# Patient Record
Sex: Female | Born: 1965 | Race: White | Hispanic: No | Marital: Married | Smoking: Never smoker
Health system: Southern US, Community
[De-identification: ages and names within clinical notes are randomized; demographics above are authoritative.]

## PROBLEM LIST (undated history)

## (undated) DIAGNOSIS — F329 Major depressive disorder, single episode, unspecified: Secondary | ICD-10-CM

## (undated) DIAGNOSIS — IMO0002 Reserved for concepts with insufficient information to code with codable children: Secondary | ICD-10-CM

## (undated) DIAGNOSIS — K3184 Gastroparesis: Secondary | ICD-10-CM

## (undated) DIAGNOSIS — M329 Systemic lupus erythematosus, unspecified: Secondary | ICD-10-CM

## (undated) DIAGNOSIS — E039 Hypothyroidism, unspecified: Secondary | ICD-10-CM

## (undated) DIAGNOSIS — Z8619 Personal history of other infectious and parasitic diseases: Secondary | ICD-10-CM

## (undated) DIAGNOSIS — R1115 Cyclical vomiting syndrome unrelated to migraine: Secondary | ICD-10-CM

## (undated) DIAGNOSIS — M797 Fibromyalgia: Secondary | ICD-10-CM

## (undated) DIAGNOSIS — G8929 Other chronic pain: Secondary | ICD-10-CM

## (undated) DIAGNOSIS — D649 Anemia, unspecified: Secondary | ICD-10-CM

## (undated) DIAGNOSIS — R109 Unspecified abdominal pain: Secondary | ICD-10-CM

## (undated) DIAGNOSIS — M069 Rheumatoid arthritis, unspecified: Secondary | ICD-10-CM

## (undated) DIAGNOSIS — K920 Hematemesis: Secondary | ICD-10-CM

## (undated) DIAGNOSIS — K529 Noninfective gastroenteritis and colitis, unspecified: Secondary | ICD-10-CM

## (undated) DIAGNOSIS — E876 Hypokalemia: Secondary | ICD-10-CM

## (undated) DIAGNOSIS — K649 Unspecified hemorrhoids: Secondary | ICD-10-CM

## (undated) DIAGNOSIS — K5900 Constipation, unspecified: Secondary | ICD-10-CM

## (undated) DIAGNOSIS — Z9181 History of falling: Secondary | ICD-10-CM

## (undated) DIAGNOSIS — F32A Depression, unspecified: Secondary | ICD-10-CM

## (undated) HISTORY — DX: Personal history of other infectious and parasitic diseases: Z86.19

## (undated) HISTORY — DX: Unspecified hemorrhoids: K64.9

## (undated) HISTORY — PX: OOPHORECTOMY: SHX86

## (undated) HISTORY — PX: CHOLECYSTECTOMY: SHX55

## (undated) HISTORY — DX: Depression, unspecified: F32.A

## (undated) HISTORY — PX: KNEE SURGERY: SHX244

## (undated) HISTORY — PX: REPLACEMENT TOTAL KNEE: SUR1224

## (undated) HISTORY — DX: Hypokalemia: E87.6

## (undated) HISTORY — DX: History of falling: Z91.81

## (undated) HISTORY — DX: Major depressive disorder, single episode, unspecified: F32.9

## (undated) HISTORY — PX: ABDOMINAL HYSTERECTOMY: SHX81

## (undated) HISTORY — DX: Anemia, unspecified: D64.9

## (undated) HISTORY — DX: Constipation, unspecified: K59.00

---

## 2005-03-31 ENCOUNTER — Ambulatory Visit: Payer: Self-pay | Admitting: Family Medicine

## 2005-05-23 ENCOUNTER — Emergency Department: Payer: Self-pay | Admitting: Unknown Physician Specialty

## 2005-05-25 ENCOUNTER — Ambulatory Visit: Payer: Self-pay | Admitting: Family Medicine

## 2005-05-26 ENCOUNTER — Emergency Department: Payer: Self-pay | Admitting: Emergency Medicine

## 2005-05-26 ENCOUNTER — Other Ambulatory Visit: Payer: Self-pay

## 2005-08-23 ENCOUNTER — Ambulatory Visit: Payer: Self-pay | Admitting: Gastroenterology

## 2005-08-27 ENCOUNTER — Ambulatory Visit: Payer: Self-pay | Admitting: Gynecology

## 2005-08-31 ENCOUNTER — Ambulatory Visit: Payer: Self-pay | Admitting: Gynecology

## 2005-09-01 ENCOUNTER — Inpatient Hospital Stay (HOSPITAL_COMMUNITY): Admission: RE | Admit: 2005-09-01 | Discharge: 2005-09-02 | Payer: Self-pay | Admitting: Gynecology

## 2005-09-04 ENCOUNTER — Inpatient Hospital Stay: Payer: Self-pay | Admitting: Internal Medicine

## 2005-09-19 ENCOUNTER — Emergency Department: Payer: Self-pay | Admitting: Emergency Medicine

## 2005-09-21 ENCOUNTER — Inpatient Hospital Stay: Payer: Self-pay | Admitting: Internal Medicine

## 2005-10-06 ENCOUNTER — Emergency Department: Payer: Self-pay | Admitting: Emergency Medicine

## 2005-10-13 ENCOUNTER — Emergency Department: Payer: Self-pay | Admitting: Internal Medicine

## 2005-10-17 ENCOUNTER — Other Ambulatory Visit: Payer: Self-pay

## 2005-10-17 ENCOUNTER — Inpatient Hospital Stay: Payer: Self-pay | Admitting: Internal Medicine

## 2005-10-23 ENCOUNTER — Inpatient Hospital Stay: Payer: Self-pay | Admitting: Internal Medicine

## 2005-11-14 ENCOUNTER — Emergency Department: Payer: Self-pay | Admitting: Emergency Medicine

## 2005-11-17 ENCOUNTER — Inpatient Hospital Stay: Payer: Self-pay

## 2005-12-13 ENCOUNTER — Emergency Department: Payer: Self-pay | Admitting: General Practice

## 2005-12-14 ENCOUNTER — Inpatient Hospital Stay: Payer: Self-pay | Admitting: Internal Medicine

## 2006-02-12 DIAGNOSIS — G8929 Other chronic pain: Secondary | ICD-10-CM

## 2006-02-12 HISTORY — DX: Other chronic pain: G89.29

## 2006-02-19 ENCOUNTER — Ambulatory Visit: Payer: Self-pay | Admitting: Internal Medicine

## 2006-03-04 ENCOUNTER — Ambulatory Visit: Payer: Self-pay | Admitting: Unknown Physician Specialty

## 2006-05-13 ENCOUNTER — Ambulatory Visit: Payer: Self-pay | Admitting: General Practice

## 2006-05-23 ENCOUNTER — Inpatient Hospital Stay: Payer: Self-pay | Admitting: General Practice

## 2006-06-17 ENCOUNTER — Observation Stay: Payer: Self-pay | Admitting: Internal Medicine

## 2006-06-28 ENCOUNTER — Ambulatory Visit: Payer: Self-pay | Admitting: Internal Medicine

## 2006-07-11 ENCOUNTER — Emergency Department: Payer: Self-pay | Admitting: Emergency Medicine

## 2006-09-03 ENCOUNTER — Ambulatory Visit: Payer: Self-pay | Admitting: Internal Medicine

## 2006-10-02 ENCOUNTER — Ambulatory Visit: Payer: Self-pay | Admitting: Pain Medicine

## 2006-10-09 ENCOUNTER — Ambulatory Visit: Payer: Self-pay | Admitting: Pain Medicine

## 2006-10-30 ENCOUNTER — Emergency Department: Payer: Self-pay | Admitting: Emergency Medicine

## 2006-12-03 ENCOUNTER — Ambulatory Visit: Payer: Self-pay | Admitting: Pain Medicine

## 2006-12-09 ENCOUNTER — Ambulatory Visit: Payer: Self-pay | Admitting: Pain Medicine

## 2007-01-07 ENCOUNTER — Ambulatory Visit: Payer: Self-pay | Admitting: Pain Medicine

## 2007-01-27 ENCOUNTER — Inpatient Hospital Stay: Payer: Self-pay | Admitting: Internal Medicine

## 2007-02-10 ENCOUNTER — Ambulatory Visit: Payer: Self-pay | Admitting: Pain Medicine

## 2007-03-11 ENCOUNTER — Ambulatory Visit: Payer: Self-pay | Admitting: Pain Medicine

## 2007-03-17 ENCOUNTER — Ambulatory Visit: Payer: Self-pay | Admitting: Pain Medicine

## 2007-04-15 ENCOUNTER — Ambulatory Visit: Payer: Self-pay | Admitting: Pain Medicine

## 2007-04-21 ENCOUNTER — Ambulatory Visit: Payer: Self-pay | Admitting: Pain Medicine

## 2007-04-27 ENCOUNTER — Ambulatory Visit: Payer: Self-pay | Admitting: Family Medicine

## 2007-05-01 ENCOUNTER — Ambulatory Visit: Payer: Self-pay | Admitting: Pain Medicine

## 2007-05-02 ENCOUNTER — Ambulatory Visit: Payer: Self-pay | Admitting: Pain Medicine

## 2007-05-07 ENCOUNTER — Ambulatory Visit: Payer: Self-pay | Admitting: Pain Medicine

## 2007-05-17 ENCOUNTER — Emergency Department: Payer: Self-pay | Admitting: Emergency Medicine

## 2007-05-21 ENCOUNTER — Ambulatory Visit: Payer: Self-pay | Admitting: Pain Medicine

## 2007-06-02 ENCOUNTER — Other Ambulatory Visit: Payer: Self-pay

## 2007-06-02 ENCOUNTER — Emergency Department: Payer: Self-pay | Admitting: Emergency Medicine

## 2007-06-11 ENCOUNTER — Ambulatory Visit: Payer: Self-pay | Admitting: Pain Medicine

## 2007-07-14 ENCOUNTER — Ambulatory Visit: Payer: Self-pay | Admitting: Pain Medicine

## 2007-08-11 ENCOUNTER — Ambulatory Visit: Payer: Self-pay | Admitting: Pain Medicine

## 2007-09-11 ENCOUNTER — Ambulatory Visit: Payer: Self-pay | Admitting: Pain Medicine

## 2007-09-17 ENCOUNTER — Ambulatory Visit: Payer: Self-pay | Admitting: Pain Medicine

## 2007-09-18 ENCOUNTER — Ambulatory Visit: Payer: Self-pay | Admitting: General Practice

## 2007-09-30 ENCOUNTER — Inpatient Hospital Stay: Payer: Self-pay | Admitting: General Practice

## 2007-10-13 ENCOUNTER — Ambulatory Visit: Payer: Self-pay | Admitting: Pain Medicine

## 2007-10-31 ENCOUNTER — Emergency Department: Payer: Self-pay | Admitting: Emergency Medicine

## 2007-11-02 ENCOUNTER — Emergency Department: Payer: Self-pay | Admitting: Emergency Medicine

## 2007-11-02 ENCOUNTER — Other Ambulatory Visit: Payer: Self-pay

## 2007-11-05 ENCOUNTER — Ambulatory Visit: Payer: Self-pay | Admitting: Pain Medicine

## 2007-11-11 ENCOUNTER — Ambulatory Visit: Payer: Self-pay | Admitting: Pain Medicine

## 2007-11-23 ENCOUNTER — Other Ambulatory Visit: Payer: Self-pay

## 2007-11-23 ENCOUNTER — Emergency Department: Payer: Self-pay | Admitting: Emergency Medicine

## 2007-12-03 ENCOUNTER — Ambulatory Visit: Payer: Self-pay | Admitting: Pain Medicine

## 2007-12-29 ENCOUNTER — Ambulatory Visit: Payer: Self-pay | Admitting: Pain Medicine

## 2008-01-12 ENCOUNTER — Emergency Department: Payer: Self-pay | Admitting: Emergency Medicine

## 2008-01-28 ENCOUNTER — Ambulatory Visit: Payer: Self-pay | Admitting: Pain Medicine

## 2008-02-23 ENCOUNTER — Ambulatory Visit: Payer: Self-pay | Admitting: Pain Medicine

## 2008-03-18 ENCOUNTER — Ambulatory Visit: Payer: Self-pay | Admitting: Pain Medicine

## 2008-03-22 ENCOUNTER — Ambulatory Visit: Payer: Self-pay | Admitting: Pain Medicine

## 2008-04-19 ENCOUNTER — Ambulatory Visit: Payer: Self-pay | Admitting: Pain Medicine

## 2008-05-13 ENCOUNTER — Ambulatory Visit: Payer: Self-pay | Admitting: Pain Medicine

## 2008-05-31 ENCOUNTER — Ambulatory Visit: Payer: Self-pay | Admitting: Pain Medicine

## 2008-06-16 ENCOUNTER — Ambulatory Visit: Payer: Self-pay | Admitting: Pain Medicine

## 2008-07-13 ENCOUNTER — Ambulatory Visit: Payer: Self-pay | Admitting: Pain Medicine

## 2008-07-21 ENCOUNTER — Ambulatory Visit: Payer: Self-pay | Admitting: Pain Medicine

## 2008-08-12 ENCOUNTER — Ambulatory Visit: Payer: Self-pay | Admitting: Pain Medicine

## 2008-09-09 ENCOUNTER — Ambulatory Visit: Payer: Self-pay | Admitting: Pain Medicine

## 2008-10-07 ENCOUNTER — Ambulatory Visit: Payer: Self-pay | Admitting: Pain Medicine

## 2008-11-04 ENCOUNTER — Ambulatory Visit: Payer: Self-pay | Admitting: Pain Medicine

## 2008-11-08 ENCOUNTER — Ambulatory Visit: Payer: Self-pay | Admitting: Pain Medicine

## 2008-11-30 ENCOUNTER — Ambulatory Visit: Payer: Self-pay | Admitting: Pain Medicine

## 2009-01-04 ENCOUNTER — Ambulatory Visit: Payer: Self-pay | Admitting: Pain Medicine

## 2009-01-10 ENCOUNTER — Ambulatory Visit: Payer: Self-pay | Admitting: Pain Medicine

## 2009-01-31 ENCOUNTER — Ambulatory Visit: Payer: Self-pay | Admitting: Pain Medicine

## 2009-03-14 ENCOUNTER — Ambulatory Visit: Payer: Self-pay | Admitting: Pain Medicine

## 2009-03-30 ENCOUNTER — Ambulatory Visit: Payer: Self-pay | Admitting: Pain Medicine

## 2009-04-12 ENCOUNTER — Ambulatory Visit: Payer: Self-pay | Admitting: Pain Medicine

## 2009-05-10 ENCOUNTER — Ambulatory Visit: Payer: Self-pay | Admitting: Pain Medicine

## 2009-06-09 ENCOUNTER — Ambulatory Visit: Payer: Self-pay | Admitting: Pain Medicine

## 2009-06-15 ENCOUNTER — Ambulatory Visit: Payer: Self-pay | Admitting: Pain Medicine

## 2009-07-07 ENCOUNTER — Ambulatory Visit: Payer: Self-pay | Admitting: Pain Medicine

## 2009-07-25 ENCOUNTER — Ambulatory Visit: Payer: Self-pay | Admitting: Pain Medicine

## 2009-08-09 ENCOUNTER — Ambulatory Visit: Payer: Self-pay | Admitting: Pain Medicine

## 2009-09-06 ENCOUNTER — Ambulatory Visit: Payer: Self-pay | Admitting: Pain Medicine

## 2009-09-22 ENCOUNTER — Emergency Department: Payer: Self-pay | Admitting: Emergency Medicine

## 2009-10-04 ENCOUNTER — Emergency Department: Payer: Self-pay | Admitting: Emergency Medicine

## 2009-10-06 ENCOUNTER — Emergency Department: Payer: Self-pay | Admitting: Emergency Medicine

## 2009-10-06 ENCOUNTER — Ambulatory Visit: Payer: Self-pay | Admitting: Pain Medicine

## 2009-10-28 ENCOUNTER — Observation Stay: Payer: Self-pay | Admitting: Internal Medicine

## 2009-11-03 ENCOUNTER — Ambulatory Visit: Payer: Self-pay | Admitting: Pain Medicine

## 2009-11-14 ENCOUNTER — Ambulatory Visit: Payer: Self-pay | Admitting: Pain Medicine

## 2009-12-01 ENCOUNTER — Ambulatory Visit: Payer: Self-pay | Admitting: Pain Medicine

## 2009-12-07 ENCOUNTER — Ambulatory Visit: Payer: Self-pay | Admitting: Pain Medicine

## 2009-12-20 DIAGNOSIS — F329 Major depressive disorder, single episode, unspecified: Secondary | ICD-10-CM

## 2009-12-29 ENCOUNTER — Emergency Department: Payer: Self-pay | Admitting: Emergency Medicine

## 2010-01-03 ENCOUNTER — Ambulatory Visit: Payer: Self-pay | Admitting: Pain Medicine

## 2010-01-09 ENCOUNTER — Ambulatory Visit: Payer: Self-pay | Admitting: Internal Medicine

## 2010-02-02 ENCOUNTER — Ambulatory Visit: Payer: Self-pay | Admitting: Pain Medicine

## 2010-03-01 ENCOUNTER — Ambulatory Visit: Payer: Self-pay | Admitting: Pain Medicine

## 2010-03-27 ENCOUNTER — Ambulatory Visit: Payer: Self-pay | Admitting: Pain Medicine

## 2010-04-13 ENCOUNTER — Emergency Department: Payer: Self-pay | Admitting: Emergency Medicine

## 2010-04-24 ENCOUNTER — Emergency Department: Payer: Self-pay | Admitting: Emergency Medicine

## 2010-04-26 ENCOUNTER — Emergency Department: Payer: Self-pay | Admitting: Emergency Medicine

## 2010-04-27 ENCOUNTER — Ambulatory Visit: Payer: Self-pay | Admitting: Pain Medicine

## 2010-05-18 ENCOUNTER — Emergency Department: Payer: Self-pay | Admitting: Emergency Medicine

## 2010-06-01 ENCOUNTER — Ambulatory Visit: Payer: Self-pay | Admitting: Pain Medicine

## 2010-06-07 ENCOUNTER — Ambulatory Visit: Payer: Self-pay | Admitting: Pain Medicine

## 2010-06-26 ENCOUNTER — Ambulatory Visit: Payer: Self-pay | Admitting: Pain Medicine

## 2010-07-27 ENCOUNTER — Ambulatory Visit: Payer: Self-pay | Admitting: Pain Medicine

## 2010-08-01 DIAGNOSIS — N3946 Mixed incontinence: Secondary | ICD-10-CM | POA: Insufficient documentation

## 2010-08-09 ENCOUNTER — Ambulatory Visit: Payer: Self-pay | Admitting: Pain Medicine

## 2010-08-22 ENCOUNTER — Ambulatory Visit: Payer: Self-pay | Admitting: Pain Medicine

## 2010-08-28 ENCOUNTER — Ambulatory Visit: Payer: Self-pay | Admitting: Pain Medicine

## 2010-09-14 DIAGNOSIS — R1115 Cyclical vomiting syndrome unrelated to migraine: Secondary | ICD-10-CM | POA: Insufficient documentation

## 2010-09-26 ENCOUNTER — Ambulatory Visit: Payer: Self-pay | Admitting: Pain Medicine

## 2010-10-24 ENCOUNTER — Ambulatory Visit: Payer: Self-pay | Admitting: Pain Medicine

## 2010-10-28 ENCOUNTER — Emergency Department: Payer: Self-pay | Admitting: Unknown Physician Specialty

## 2010-10-28 ENCOUNTER — Emergency Department: Payer: Self-pay | Admitting: Emergency Medicine

## 2010-11-08 ENCOUNTER — Ambulatory Visit: Payer: Self-pay | Admitting: Pain Medicine

## 2010-11-28 ENCOUNTER — Ambulatory Visit: Payer: Self-pay | Admitting: Pain Medicine

## 2010-12-21 ENCOUNTER — Ambulatory Visit: Payer: Self-pay | Admitting: Pain Medicine

## 2011-01-01 ENCOUNTER — Ambulatory Visit: Payer: Self-pay | Admitting: Pain Medicine

## 2011-01-23 ENCOUNTER — Ambulatory Visit: Payer: Self-pay | Admitting: Pain Medicine

## 2011-02-11 ENCOUNTER — Emergency Department: Payer: Self-pay | Admitting: Emergency Medicine

## 2011-02-13 ENCOUNTER — Emergency Department: Payer: Self-pay | Admitting: Emergency Medicine

## 2011-02-13 LAB — COMPREHENSIVE METABOLIC PANEL
Alkaline Phosphatase: 113 U/L (ref 50–136)
Anion Gap: 13 (ref 7–16)
BUN: 16 mg/dL (ref 7–18)
Bilirubin,Total: 0.8 mg/dL (ref 0.2–1.0)
Chloride: 107 mmol/L (ref 98–107)
Co2: 22 mmol/L (ref 21–32)
EGFR (African American): >60
Glucose: 112 mg/dL — ABNORMAL HIGH (ref 65–99)
Osmolality: 285 (ref 275–301)
SGOT(AST): 51 U/L — ABNORMAL HIGH (ref 15–37)
SGPT (ALT): 36 U/L
Sodium: 142 mmol/L (ref 136–145)
Total Protein: 8.9 g/dL — ABNORMAL HIGH (ref 6.4–8.2)

## 2011-02-13 LAB — CBC
HCT: 45 % (ref 35.0–47.0)
HGB: 15.4 g/dL (ref 12.0–16.0)
MCH: 30.3 pg (ref 26.0–34.0)
MCHC: 34.2 g/dL (ref 32.0–36.0)
MCV: 89 fL (ref 80–100)
RDW: 17.5 % — ABNORMAL HIGH (ref 11.5–14.5)

## 2011-02-13 LAB — LIPASE, BLOOD: Lipase: 73 U/L (ref 73–393)

## 2011-02-19 ENCOUNTER — Ambulatory Visit: Payer: Self-pay | Admitting: Pain Medicine

## 2011-03-03 ENCOUNTER — Emergency Department: Payer: Self-pay | Admitting: Emergency Medicine

## 2011-03-03 LAB — COMPREHENSIVE METABOLIC PANEL
Anion Gap: 11 (ref 7–16)
Bilirubin,Total: 0.5 mg/dL (ref 0.2–1.0)
Calcium, Total: 9.3 mg/dL (ref 8.5–10.1)
Chloride: 102 mmol/L (ref 98–107)
Co2: 27 mmol/L (ref 21–32)
Creatinine: 1.37 mg/dL — ABNORMAL HIGH (ref 0.60–1.30)
EGFR (African American): 53 — ABNORMAL LOW
EGFR (Non-African Amer.): 44 — ABNORMAL LOW
Osmolality: 281 (ref 275–301)
Potassium: 3.5 mmol/L (ref 3.5–5.1)
Sodium: 140 mmol/L (ref 136–145)

## 2011-03-03 LAB — CBC
MCH: 31.3 pg (ref 26.0–34.0)
Platelet: 218 10*3/uL (ref 150–440)
RBC: 4.78 10*6/uL (ref 3.80–5.20)
RDW: 17.4 % — ABNORMAL HIGH (ref 11.5–14.5)
WBC: 6 10*3/uL (ref 3.6–11.0)

## 2011-03-03 LAB — LIPASE, BLOOD: Lipase: 64 U/L — ABNORMAL LOW (ref 73–393)

## 2011-03-03 LAB — T4, FREE: Free Thyroxine: 0.15 ng/dL — ABNORMAL LOW (ref 0.76–1.46)

## 2011-03-16 DIAGNOSIS — K3184 Gastroparesis: Secondary | ICD-10-CM

## 2011-03-16 HISTORY — DX: Gastroparesis: K31.84

## 2011-03-18 ENCOUNTER — Emergency Department: Payer: Self-pay | Admitting: *Deleted

## 2011-03-18 LAB — LIPASE, BLOOD: Lipase: 77 U/L (ref 73–393)

## 2011-03-18 LAB — CBC
HCT: 41.3 % (ref 35.0–47.0)
MCHC: 34.7 g/dL (ref 32.0–36.0)
MCV: 92 fL (ref 80–100)
Platelet: 222 10*3/uL (ref 150–440)
RDW: 18.3 % — ABNORMAL HIGH (ref 11.5–14.5)
WBC: 5.3 10*3/uL (ref 3.6–11.0)

## 2011-03-18 LAB — COMPREHENSIVE METABOLIC PANEL
Alkaline Phosphatase: 114 U/L (ref 50–136)
Anion Gap: 13 (ref 7–16)
Bilirubin,Total: 0.7 mg/dL (ref 0.2–1.0)
Calcium, Total: 9.2 mg/dL (ref 8.5–10.1)
Co2: 24 mmol/L (ref 21–32)
EGFR (Non-African Amer.): 46 — ABNORMAL LOW
Glucose: 97 mg/dL (ref 65–99)
Osmolality: 285 (ref 275–301)
Sodium: 144 mmol/L (ref 136–145)
Total Protein: 8.7 g/dL — ABNORMAL HIGH (ref 6.4–8.2)

## 2011-03-19 LAB — URINALYSIS, COMPLETE
Bilirubin,UR: NEGATIVE
Ketone: NEGATIVE
Nitrite: NEGATIVE
Ph: 6 (ref 4.5–8.0)
Protein: NEGATIVE
RBC,UR: 3 /HPF (ref 0–5)
Squamous Epithelial: 17
WBC UR: 3 /HPF (ref 0–5)

## 2011-03-19 LAB — COMPREHENSIVE METABOLIC PANEL
Anion Gap: 12 (ref 7–16)
Calcium, Total: 9.1 mg/dL (ref 8.5–10.1)
Co2: 26 mmol/L (ref 21–32)
EGFR (African American): 56 — ABNORMAL LOW
Glucose: 99 mg/dL (ref 65–99)
Potassium: 3.5 mmol/L (ref 3.5–5.1)
SGPT (ALT): 28 U/L
Sodium: 143 mmol/L (ref 136–145)
Total Protein: 8.6 g/dL — ABNORMAL HIGH (ref 6.4–8.2)

## 2011-03-19 LAB — CBC
HGB: 14.3 g/dL (ref 12.0–16.0)
MCV: 92 fL (ref 80–100)
Platelet: 215 10*3/uL (ref 150–440)
RBC: 4.49 10*6/uL (ref 3.80–5.20)
RDW: 18.2 % — ABNORMAL HIGH (ref 11.5–14.5)
WBC: 5.7 10*3/uL (ref 3.6–11.0)

## 2011-03-21 ENCOUNTER — Inpatient Hospital Stay: Payer: Self-pay | Admitting: Internal Medicine

## 2011-03-21 LAB — BASIC METABOLIC PANEL
Anion Gap: 12 (ref 7–16)
BUN: 5 mg/dL — ABNORMAL LOW (ref 7–18)
Chloride: 108 mmol/L — ABNORMAL HIGH (ref 98–107)
Co2: 25 mmol/L (ref 21–32)
Creatinine: 1.01 mg/dL (ref 0.60–1.30)
EGFR (African American): >60
Glucose: 81 mg/dL (ref 65–99)
Potassium: 3.2 mmol/L — ABNORMAL LOW (ref 3.5–5.1)
Sodium: 145 mmol/L (ref 136–145)

## 2011-03-21 LAB — CBC WITH DIFFERENTIAL/PLATELET
Basophil %: 1.6 %
HCT: 33 % — ABNORMAL LOW (ref 35.0–47.0)
HGB: 11.7 g/dL — ABNORMAL LOW (ref 12.0–16.0)
Lymphocyte %: 40 %
MCH: 32.8 pg (ref 26.0–34.0)
MCHC: 35.4 g/dL (ref 32.0–36.0)
Monocyte #: 0.3 10*3/uL (ref 0.0–0.7)
Neutrophil #: 1.9 10*3/uL (ref 1.4–6.5)
Platelet: 163 10*3/uL (ref 150–440)
RBC: 3.56 10*6/uL — ABNORMAL LOW (ref 3.80–5.20)

## 2011-03-22 LAB — BASIC METABOLIC PANEL
Anion Gap: 10 (ref 7–16)
Calcium, Total: 8.7 mg/dL (ref 8.5–10.1)
Chloride: 106 mmol/L (ref 98–107)
Co2: 25 mmol/L (ref 21–32)
Creatinine: 0.98 mg/dL (ref 0.60–1.30)
EGFR (African American): >60
Osmolality: 277 (ref 275–301)

## 2011-03-22 LAB — CBC WITH DIFFERENTIAL/PLATELET
Basophil #: 0.1 10*3/uL (ref 0.0–0.1)
Basophil %: 1.3 %
Eosinophil #: 0.1 10*3/uL (ref 0.0–0.7)
Eosinophil %: 3.5 %
HCT: 35.1 % (ref 35.0–47.0)
HGB: 12 g/dL (ref 12.0–16.0)
Lymphocyte %: 43.5 %
MCH: 31.5 pg (ref 26.0–34.0)
MCHC: 34.1 g/dL (ref 32.0–36.0)
Monocyte #: 0.3 10*3/uL (ref 0.0–0.7)
Monocyte %: 6.8 %
Neutrophil #: 1.9 10*3/uL (ref 1.4–6.5)
Neutrophil %: 44.9 %
Platelet: 180 10*3/uL (ref 150–440)
RBC: 3.8 10*6/uL (ref 3.80–5.20)

## 2011-03-23 LAB — HEMOGLOBIN: HGB: 11.7 g/dL — ABNORMAL LOW (ref 12.0–16.0)

## 2011-03-24 LAB — COMPREHENSIVE METABOLIC PANEL
Albumin: 3.6 g/dL (ref 3.4–5.0)
Alkaline Phosphatase: 88 U/L (ref 50–136)
Anion Gap: 11 (ref 7–16)
BUN: 5 mg/dL — ABNORMAL LOW (ref 7–18)
Bilirubin,Total: 0.6 mg/dL (ref 0.2–1.0)
Calcium, Total: 8.8 mg/dL (ref 8.5–10.1)
Co2: 26 mmol/L (ref 21–32)
Creatinine: 0.85 mg/dL (ref 0.60–1.30)
EGFR (Non-African Amer.): >60
Glucose: 84 mg/dL (ref 65–99)
Osmolality: 276 (ref 275–301)
Potassium: 3.4 mmol/L — ABNORMAL LOW (ref 3.5–5.1)
SGPT (ALT): 24 U/L
Sodium: 140 mmol/L (ref 136–145)
Total Protein: 7.2 g/dL (ref 6.4–8.2)

## 2011-03-24 LAB — TSH: Thyroid Stimulating Horm: 57.8 u[IU]/mL — ABNORMAL HIGH

## 2011-03-24 LAB — T4, FREE: Free Thyroxine: 1.09 ng/dL (ref 0.76–1.46)

## 2011-03-26 LAB — POTASSIUM: Potassium: 3.4 mmol/L — ABNORMAL LOW (ref 3.5–5.1)

## 2011-03-26 LAB — MAGNESIUM: Magnesium: 1.4 mg/dL — ABNORMAL LOW

## 2011-03-26 LAB — PLATELET COUNT: Platelet: 153 10*3/uL (ref 150–440)

## 2011-03-27 ENCOUNTER — Ambulatory Visit: Payer: Self-pay | Admitting: Pain Medicine

## 2011-04-06 ENCOUNTER — Emergency Department: Payer: Self-pay | Admitting: Emergency Medicine

## 2011-04-11 ENCOUNTER — Ambulatory Visit: Payer: Self-pay | Admitting: Pain Medicine

## 2011-04-17 DIAGNOSIS — M19049 Primary osteoarthritis, unspecified hand: Secondary | ICD-10-CM | POA: Insufficient documentation

## 2011-05-01 LAB — URINALYSIS, COMPLETE
Bilirubin,UR: NEGATIVE
Blood: NEGATIVE
Ph: 5 (ref 4.5–8.0)
RBC,UR: 3 /HPF (ref 0–5)
Specific Gravity: 1.029 (ref 1.003–1.030)
Squamous Epithelial: 26
WBC UR: 5 /HPF (ref 0–5)

## 2011-05-02 ENCOUNTER — Observation Stay: Payer: Self-pay | Admitting: Internal Medicine

## 2011-05-02 LAB — CBC
HGB: 14.2 g/dL (ref 12.0–16.0)
MCHC: 34 g/dL (ref 32.0–36.0)
Platelet: 233 10*3/uL (ref 150–440)
RDW: 14.1 % (ref 11.5–14.5)
WBC: 8.1 10*3/uL (ref 3.6–11.0)

## 2011-05-02 LAB — COMPREHENSIVE METABOLIC PANEL
Alkaline Phosphatase: 101 U/L (ref 50–136)
Anion Gap: 18 — ABNORMAL HIGH (ref 7–16)
BUN: 16 mg/dL (ref 7–18)
Bilirubin,Total: 0.6 mg/dL (ref 0.2–1.0)
Calcium, Total: 8.7 mg/dL (ref 8.5–10.1)
Chloride: 106 mmol/L (ref 98–107)
Co2: 18 mmol/L — ABNORMAL LOW (ref 21–32)
EGFR (African American): >60
EGFR (Non-African Amer.): >60
SGOT(AST): 27 U/L (ref 15–37)
SGPT (ALT): 15 U/L
Total Protein: 8.2 g/dL (ref 6.4–8.2)

## 2011-05-02 LAB — LIPASE, BLOOD: Lipase: 43 U/L — ABNORMAL LOW (ref 73–393)

## 2011-05-03 LAB — CBC WITH DIFFERENTIAL/PLATELET
Basophil %: 0.7 %
Eosinophil %: 3.8 %
HCT: 40.8 % (ref 35.0–47.0)
HGB: 14.1 g/dL (ref 12.0–16.0)
Lymphocyte #: 2 10*3/uL (ref 1.0–3.6)
MCH: 31.5 pg (ref 26.0–34.0)
MCV: 91 fL (ref 80–100)
Monocyte #: 0.3 10*3/uL (ref 0.0–0.7)
Platelet: 219 10*3/uL (ref 150–440)
RBC: 4.48 10*6/uL (ref 3.80–5.20)
WBC: 5.7 10*3/uL (ref 3.6–11.0)

## 2011-05-03 LAB — COMPREHENSIVE METABOLIC PANEL
Albumin: 4 g/dL (ref 3.4–5.0)
Alkaline Phosphatase: 104 U/L (ref 50–136)
BUN: 9 mg/dL (ref 7–18)
Calcium, Total: 8.9 mg/dL (ref 8.5–10.1)
Co2: 21 mmol/L (ref 21–32)
EGFR (Non-African Amer.): >60
Osmolality: 279 (ref 275–301)
SGOT(AST): 27 U/L (ref 15–37)
SGPT (ALT): 16 U/L

## 2011-05-03 LAB — URINE CULTURE

## 2011-05-08 ENCOUNTER — Ambulatory Visit: Payer: Self-pay | Admitting: Pain Medicine

## 2011-05-21 ENCOUNTER — Ambulatory Visit: Payer: Self-pay | Admitting: Pain Medicine

## 2011-06-01 ENCOUNTER — Emergency Department: Payer: Self-pay | Admitting: Emergency Medicine

## 2011-06-07 ENCOUNTER — Ambulatory Visit: Payer: Self-pay | Admitting: Pain Medicine

## 2011-07-10 ENCOUNTER — Ambulatory Visit: Payer: Self-pay | Admitting: Pain Medicine

## 2011-07-18 ENCOUNTER — Ambulatory Visit: Payer: Self-pay | Admitting: Pain Medicine

## 2011-08-07 ENCOUNTER — Ambulatory Visit: Payer: Self-pay | Admitting: Pain Medicine

## 2011-08-15 ENCOUNTER — Ambulatory Visit: Payer: Self-pay | Admitting: Pain Medicine

## 2011-09-04 ENCOUNTER — Ambulatory Visit: Payer: Self-pay | Admitting: Pain Medicine

## 2011-10-02 ENCOUNTER — Ambulatory Visit: Payer: Self-pay | Admitting: Pain Medicine

## 2011-10-08 ENCOUNTER — Ambulatory Visit: Payer: Self-pay | Admitting: Pain Medicine

## 2011-11-06 ENCOUNTER — Ambulatory Visit: Payer: Self-pay | Admitting: Pain Medicine

## 2011-12-06 ENCOUNTER — Ambulatory Visit: Payer: Self-pay | Admitting: Pain Medicine

## 2011-12-24 ENCOUNTER — Ambulatory Visit: Payer: Self-pay | Admitting: Pain Medicine

## 2012-01-19 DIAGNOSIS — K529 Noninfective gastroenteritis and colitis, unspecified: Secondary | ICD-10-CM | POA: Insufficient documentation

## 2012-01-22 ENCOUNTER — Ambulatory Visit: Payer: Self-pay | Admitting: Pain Medicine

## 2012-02-17 ENCOUNTER — Emergency Department: Payer: Self-pay | Admitting: Emergency Medicine

## 2012-02-21 ENCOUNTER — Ambulatory Visit: Payer: Self-pay | Admitting: Pain Medicine

## 2012-03-03 ENCOUNTER — Emergency Department: Payer: Self-pay | Admitting: Emergency Medicine

## 2012-03-17 ENCOUNTER — Ambulatory Visit: Payer: Self-pay | Admitting: Pain Medicine

## 2012-04-17 ENCOUNTER — Ambulatory Visit: Payer: Self-pay | Admitting: Pain Medicine

## 2012-04-22 IMAGING — CR DG ABDOMEN 3V
1 series · 5 of 5 positions shown · non-contrast
Comparison: none

REASON FOR EXAM: n/v/d
COMMENTS:

PROCEDURE:     DXR - DXR ABDOMEN 3-WAY (INCL PA CXR)  - May 02, 2011  [DATE]
RESULT:     Comparisons:  None

[Series 1: w chest pa · 0.14mm/px · 5 of 5 slices shown]
[im 1/5]
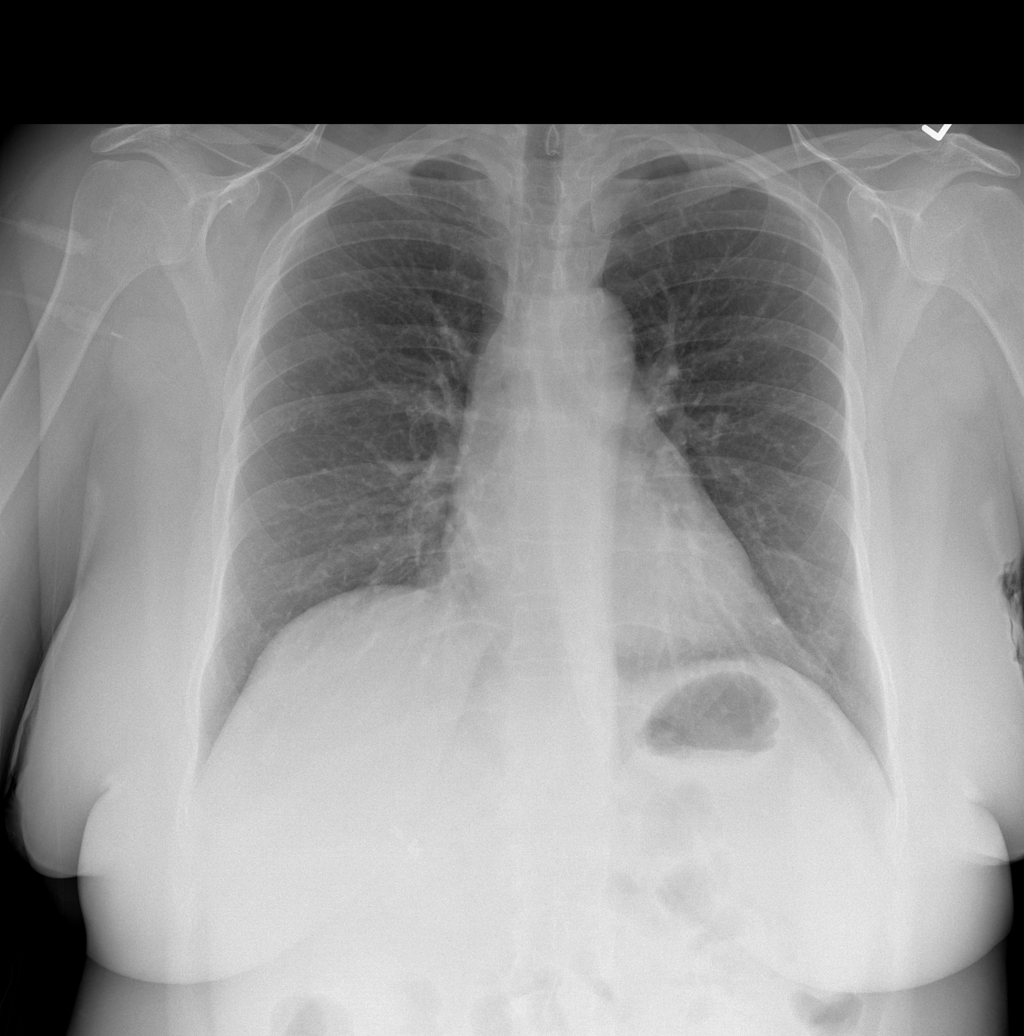
[im 2/5]
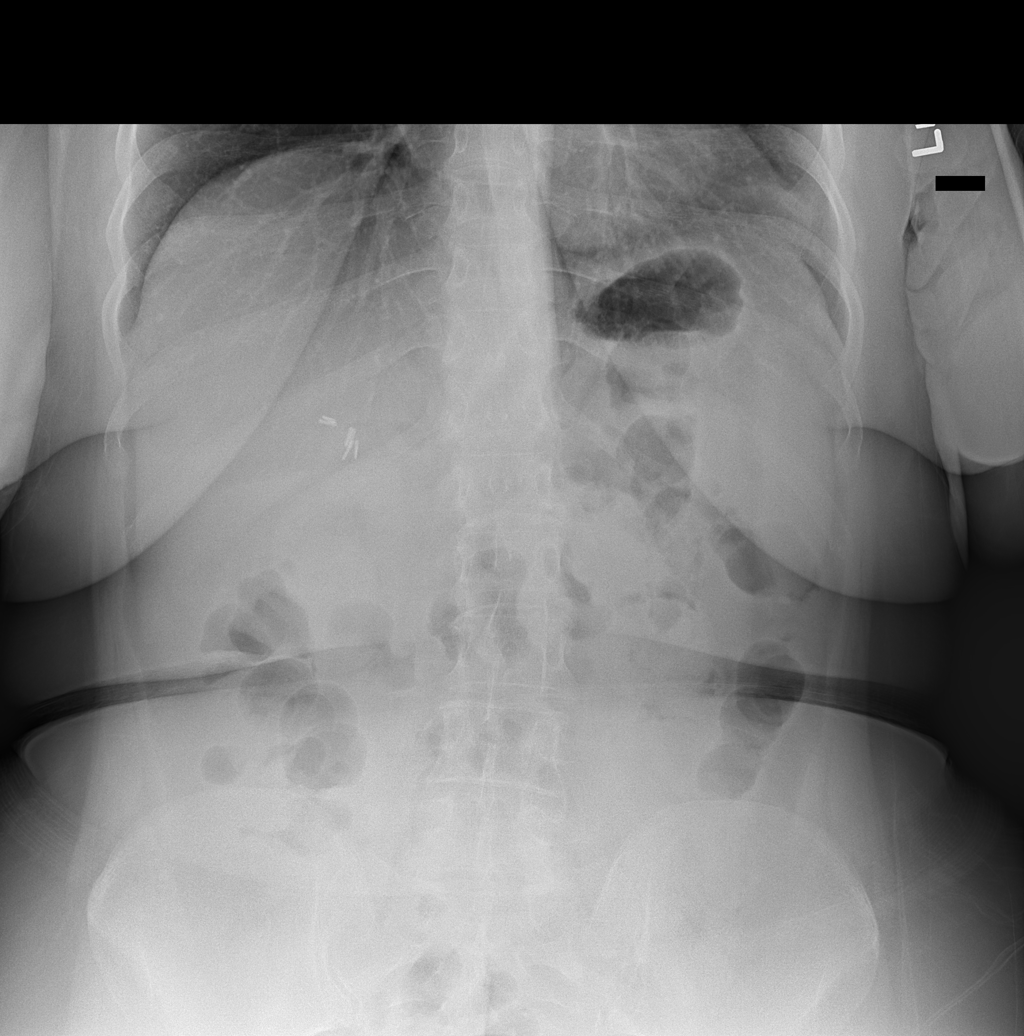
[im 3/5]
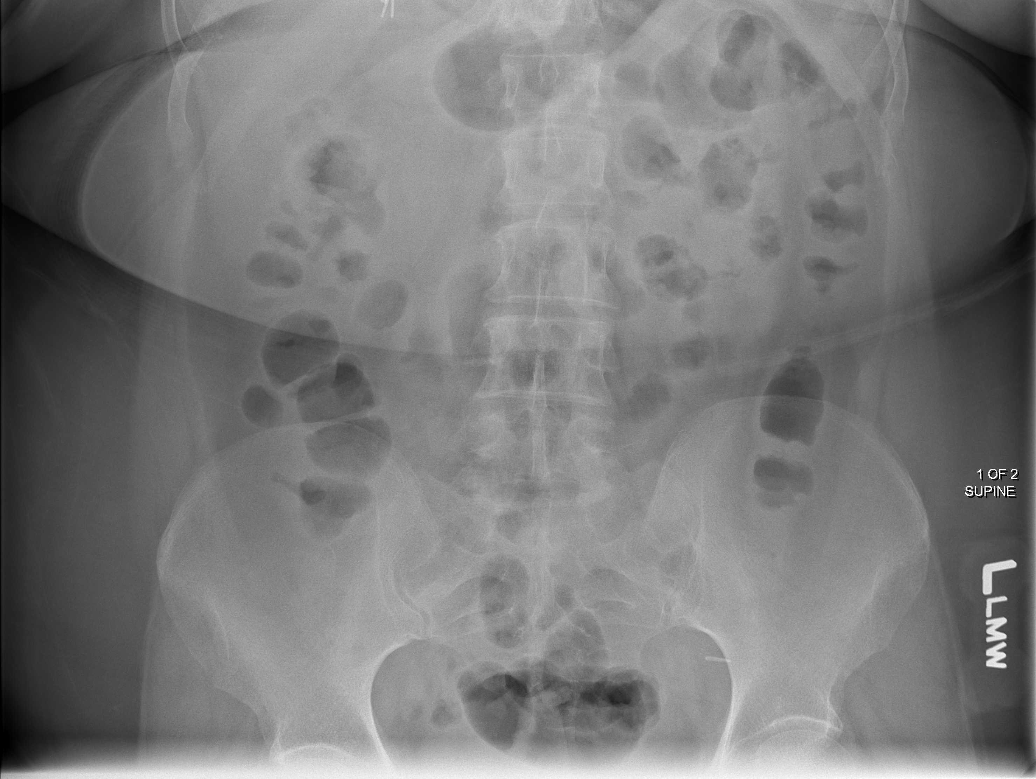
[im 4/5]
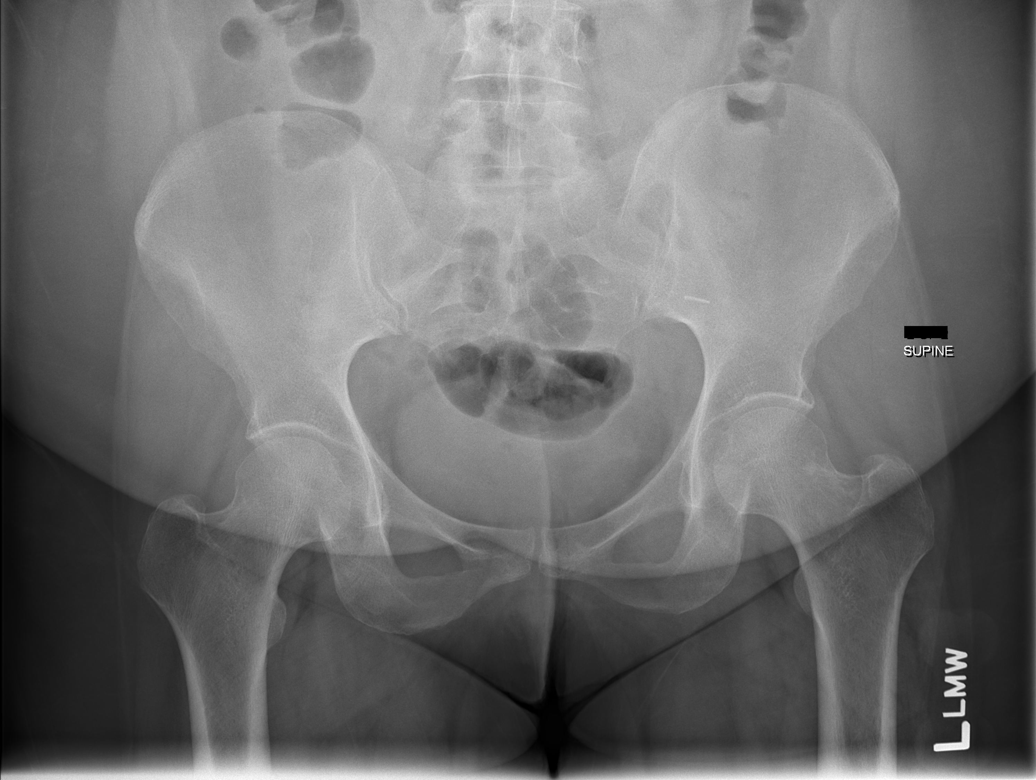
[im 5/5]
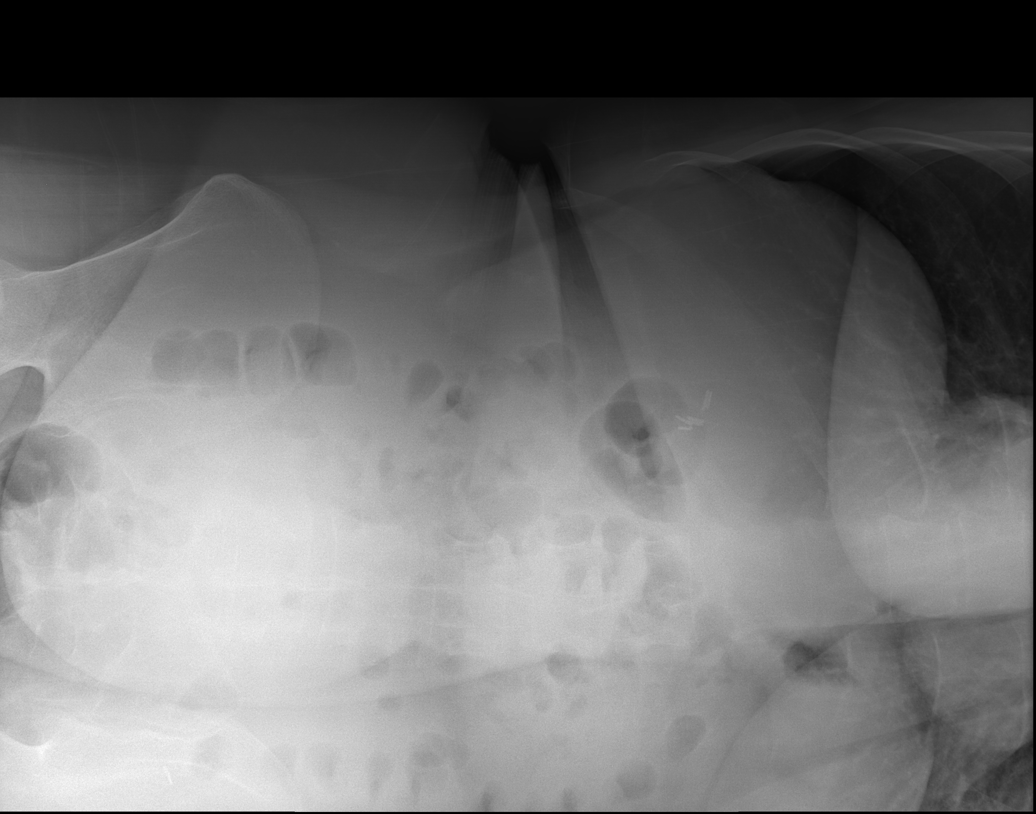

[5 of 5 positions shown; findings below may reference images not displayed]

FINDINGS: PA chest and supine, upright, and left lateral decubitus views of the
abdomen are provided.

The chest is clear. The heart and mediastinum are unremarkable. The osseous
structures are unremarkable.

There is a nonspecific bowel gas pattern. There is no bowel dilatation to
suggest obstruction. There are no air-fluid levels. There are no dilated
loops, bowel wall thickening, or evidence of mass effect.  Soft tissue
shadows are normal. There is no evidence of pneumoperitoneum, portal venous
gas, or pneumatosis.

Osseous structures are unremarkable.
IMPRESSION: Unremarkable abdominal series.

## 2012-05-15 ENCOUNTER — Ambulatory Visit: Payer: Self-pay | Admitting: Pain Medicine

## 2012-05-28 ENCOUNTER — Ambulatory Visit: Payer: Self-pay | Admitting: Pain Medicine

## 2012-06-17 ENCOUNTER — Ambulatory Visit: Payer: Self-pay | Admitting: Pain Medicine

## 2012-06-30 ENCOUNTER — Emergency Department: Payer: Self-pay | Admitting: Unknown Physician Specialty

## 2012-06-30 LAB — COMPREHENSIVE METABOLIC PANEL
Albumin: 3.2 g/dL — ABNORMAL LOW (ref 3.4–5.0)
Alkaline Phosphatase: 103 U/L (ref 50–136)
Anion Gap: 10 (ref 7–16)
Bilirubin,Total: 0.7 mg/dL (ref 0.2–1.0)
Chloride: 110 mmol/L — ABNORMAL HIGH (ref 98–107)
Co2: 16 mmol/L — ABNORMAL LOW (ref 21–32)
Creatinine: 0.55 mg/dL — ABNORMAL LOW (ref 0.60–1.30)
EGFR (African American): >60
Glucose: 86 mg/dL (ref 65–99)
Osmolality: 271 (ref 275–301)
SGPT (ALT): 39 U/L (ref 12–78)
Sodium: 136 mmol/L (ref 136–145)

## 2012-06-30 LAB — URINALYSIS, COMPLETE
Bilirubin,UR: NEGATIVE
Glucose,UR: NEGATIVE mg/dL (ref 0–75)
Ph: 6 (ref 4.5–8.0)
Protein: NEGATIVE
Specific Gravity: 1.01 (ref 1.003–1.030)
Squamous Epithelial: 30
WBC UR: 20 /HPF (ref 0–5)

## 2012-06-30 LAB — LIPASE, BLOOD: Lipase: 24 U/L — ABNORMAL LOW (ref 73–393)

## 2012-06-30 LAB — CBC
HCT: 38.4 % (ref 35.0–47.0)
HGB: 13.6 g/dL (ref 12.0–16.0)
MCV: 93 fL (ref 80–100)
RBC: 4.13 10*6/uL (ref 3.80–5.20)
RDW: 15.4 % — ABNORMAL HIGH (ref 11.5–14.5)
WBC: 5.2 10*3/uL (ref 3.6–11.0)

## 2012-07-09 ENCOUNTER — Ambulatory Visit: Payer: Self-pay | Admitting: Pain Medicine

## 2012-08-14 ENCOUNTER — Ambulatory Visit: Payer: Self-pay | Admitting: Pain Medicine

## 2012-08-20 ENCOUNTER — Ambulatory Visit: Payer: Self-pay | Admitting: Pain Medicine

## 2012-09-09 ENCOUNTER — Ambulatory Visit: Payer: Self-pay | Admitting: Pain Medicine

## 2012-09-22 ENCOUNTER — Ambulatory Visit: Payer: Self-pay | Admitting: Pain Medicine

## 2012-09-23 ENCOUNTER — Emergency Department: Payer: Self-pay | Admitting: Emergency Medicine

## 2012-09-29 ENCOUNTER — Ambulatory Visit: Payer: Self-pay | Admitting: Pain Medicine

## 2012-10-01 DIAGNOSIS — R768 Other specified abnormal immunological findings in serum: Secondary | ICD-10-CM | POA: Insufficient documentation

## 2012-10-06 ENCOUNTER — Emergency Department: Payer: Self-pay | Admitting: Emergency Medicine

## 2012-10-06 LAB — COMPREHENSIVE METABOLIC PANEL
Anion Gap: 7 (ref 7–16)
Bilirubin,Total: 0.6 mg/dL (ref 0.2–1.0)
Chloride: 106 mmol/L (ref 98–107)
Creatinine: 1.16 mg/dL (ref 0.60–1.30)
EGFR (Non-African Amer.): 55 — ABNORMAL LOW
Glucose: 86 mg/dL (ref 65–99)
Osmolality: 275 (ref 275–301)
Potassium: 3.7 mmol/L (ref 3.5–5.1)
Total Protein: 8.4 g/dL — ABNORMAL HIGH (ref 6.4–8.2)

## 2012-10-06 LAB — CBC
HCT: 40.3 % (ref 35.0–47.0)
MCH: 31 pg (ref 26.0–34.0)
MCV: 88 fL (ref 80–100)
Platelet: 173 10*3/uL (ref 150–440)
WBC: 5.8 10*3/uL (ref 3.6–11.0)

## 2012-10-09 ENCOUNTER — Ambulatory Visit: Payer: Self-pay | Admitting: Pain Medicine

## 2012-10-14 ENCOUNTER — Ambulatory Visit: Payer: Self-pay | Admitting: Family Medicine

## 2012-10-26 DIAGNOSIS — R42 Dizziness and giddiness: Secondary | ICD-10-CM | POA: Insufficient documentation

## 2012-10-29 ENCOUNTER — Emergency Department: Payer: Self-pay | Admitting: Emergency Medicine

## 2012-10-30 ENCOUNTER — Ambulatory Visit: Payer: Self-pay | Admitting: Pain Medicine

## 2012-10-31 ENCOUNTER — Encounter: Payer: Self-pay | Admitting: Neurology

## 2012-10-31 ENCOUNTER — Ambulatory Visit: Payer: Self-pay | Admitting: Pain Medicine

## 2012-10-31 ENCOUNTER — Institutional Professional Consult (permissible substitution): Payer: Medicare Other | Admitting: Neurology

## 2012-11-01 ENCOUNTER — Ambulatory Visit: Payer: Self-pay | Admitting: Internal Medicine

## 2012-11-03 ENCOUNTER — Emergency Department: Payer: Self-pay | Admitting: Emergency Medicine

## 2012-11-03 DIAGNOSIS — E785 Hyperlipidemia, unspecified: Secondary | ICD-10-CM | POA: Insufficient documentation

## 2012-11-03 LAB — TSH: Thyroid Stimulating Horm: 33.8 u[IU]/mL — ABNORMAL HIGH

## 2012-11-03 LAB — URINALYSIS, COMPLETE
Bacteria: NONE SEEN
Blood: NEGATIVE
Glucose,UR: NEGATIVE mg/dL (ref 0–75)
Ketone: NEGATIVE
Nitrite: NEGATIVE
Ph: 8 (ref 4.5–8.0)
Protein: NEGATIVE
RBC,UR: 1 /HPF (ref 0–5)
Specific Gravity: 1.005 (ref 1.003–1.030)
Squamous Epithelial: 7
WBC UR: 2 /HPF (ref 0–5)

## 2012-11-03 LAB — CK TOTAL AND CKMB (NOT AT ARMC): CK-MB: 0.9 ng/mL (ref 0.5–3.6)

## 2012-11-03 LAB — CBC
HCT: 36 % (ref 35.0–47.0)
HGB: 12.5 g/dL (ref 12.0–16.0)
MCH: 31.2 pg (ref 26.0–34.0)
MCHC: 34.7 g/dL (ref 32.0–36.0)
Platelet: 162 10*3/uL (ref 150–440)
RBC: 4.01 10*6/uL (ref 3.80–5.20)
WBC: 4.9 10*3/uL (ref 3.6–11.0)

## 2012-11-03 LAB — COMPREHENSIVE METABOLIC PANEL
Albumin: 3.9 g/dL (ref 3.4–5.0)
Alkaline Phosphatase: 122 U/L (ref 50–136)
Anion Gap: 8 (ref 7–16)
BUN: 20 mg/dL — ABNORMAL HIGH (ref 7–18)
Bilirubin,Total: 0.5 mg/dL (ref 0.2–1.0)
Co2: 24 mmol/L (ref 21–32)
EGFR (African American): >60
Glucose: 91 mg/dL (ref 65–99)
Osmolality: 280 (ref 275–301)
Potassium: 3.2 mmol/L — ABNORMAL LOW (ref 3.5–5.1)
SGPT (ALT): 29 U/L (ref 12–78)
Total Protein: 7.9 g/dL (ref 6.4–8.2)

## 2012-11-03 LAB — TROPONIN I: Troponin-I: <0.02 ng/mL

## 2012-11-03 LAB — T4, FREE: Free Thyroxine: 1.22 ng/dL (ref 0.76–1.46)

## 2012-11-10 ENCOUNTER — Encounter (HOSPITAL_COMMUNITY): Payer: Self-pay | Admitting: Nurse Practitioner

## 2012-11-10 ENCOUNTER — Emergency Department (HOSPITAL_COMMUNITY)
Admission: EM | Admit: 2012-11-10 | Discharge: 2012-11-10 | Disposition: A | Discharge status: Home / Self Care | Payer: Medicare Other | Attending: Emergency Medicine | Admitting: Emergency Medicine

## 2012-11-10 DIAGNOSIS — R1032 Left lower quadrant pain: Secondary | ICD-10-CM | POA: Insufficient documentation

## 2012-11-10 DIAGNOSIS — Z79899 Other long term (current) drug therapy: Secondary | ICD-10-CM | POA: Insufficient documentation

## 2012-11-10 DIAGNOSIS — R109 Unspecified abdominal pain: Secondary | ICD-10-CM

## 2012-11-10 DIAGNOSIS — Z8739 Personal history of other diseases of the musculoskeletal system and connective tissue: Secondary | ICD-10-CM | POA: Insufficient documentation

## 2012-11-10 DIAGNOSIS — R112 Nausea with vomiting, unspecified: Secondary | ICD-10-CM | POA: Insufficient documentation

## 2012-11-10 DIAGNOSIS — R197 Diarrhea, unspecified: Secondary | ICD-10-CM | POA: Insufficient documentation

## 2012-11-10 DIAGNOSIS — E079 Disorder of thyroid, unspecified: Secondary | ICD-10-CM | POA: Insufficient documentation

## 2012-11-10 DIAGNOSIS — R1031 Right lower quadrant pain: Secondary | ICD-10-CM | POA: Insufficient documentation

## 2012-11-10 DIAGNOSIS — Z8719 Personal history of other diseases of the digestive system: Secondary | ICD-10-CM | POA: Insufficient documentation

## 2012-11-10 HISTORY — DX: Rheumatoid arthritis, unspecified: M06.9

## 2012-11-10 HISTORY — DX: Gastroparesis: K31.84

## 2012-11-10 HISTORY — DX: Fibromyalgia: M79.7

## 2012-11-10 HISTORY — DX: Reserved for concepts with insufficient information to code with codable children: IMO0002

## 2012-11-10 LAB — CBC WITH DIFFERENTIAL/PLATELET
Basophils Absolute: 0 10*3/uL (ref 0.0–0.1)
Eosinophils Absolute: 0.1 10*3/uL (ref 0.0–0.7)
Eosinophils Relative: 2 % (ref 0–5)
HCT: 42.1 % (ref 36.0–46.0)
Lymphocytes Relative: 37 % (ref 12–46)
Lymphs Abs: 2.2 10*3/uL (ref 0.7–4.0)
MCH: 29.9 pg (ref 26.0–34.0)
RBC: 4.68 MIL/uL (ref 3.87–5.11)

## 2012-11-10 LAB — URINALYSIS, ROUTINE W REFLEX MICROSCOPIC
Glucose, UA: NEGATIVE mg/dL
Hgb urine dipstick: NEGATIVE
Ketones, ur: NEGATIVE mg/dL
Nitrite: NEGATIVE
Urobilinogen, UA: 1 mg/dL (ref 0.0–1.0)
pH: 6 (ref 5.0–8.0)

## 2012-11-10 LAB — COMPREHENSIVE METABOLIC PANEL
ALT: 40 U/L — ABNORMAL HIGH (ref 0–35)
AST: 30 U/L (ref 0–37)
BUN: 17 mg/dL (ref 6–23)
CO2: 20 mEq/L (ref 19–32)
Calcium: 10.1 mg/dL (ref 8.4–10.5)
Creatinine, Ser: 0.79 mg/dL (ref 0.50–1.10)
GFR calc Af Amer: >90 mL/min (ref >90)
Sodium: 139 mEq/L (ref 135–145)
Total Protein: 9.3 g/dL — ABNORMAL HIGH (ref 6.0–8.3)

## 2012-11-10 LAB — RAPID URINE DRUG SCREEN, HOSP PERFORMED: Amphetamines: NOT DETECTED

## 2012-11-10 MED ORDER — HYDROMORPHONE HCL PF 1 MG/ML IJ SOLN
1.0000 mg | Freq: Once | INTRAMUSCULAR | Status: AC
  Administered 2012-11-10: 1 mg via INTRAVENOUS
  Filled 2012-11-10: qty 1

## 2012-11-10 MED ORDER — SODIUM CHLORIDE 0.9 % IV BOLUS (SEPSIS)
1000.0000 mL | Freq: Once | INTRAVENOUS | Status: AC
  Administered 2012-11-10: 1000 mL via INTRAVENOUS

## 2012-11-10 MED ORDER — CEFTRIAXONE SODIUM 1 G IJ SOLR
1.0000 g | Freq: Once | INTRAMUSCULAR | Status: AC
  Administered 2012-11-10: 1 g via INTRAVENOUS
  Filled 2012-11-10: qty 10

## 2012-11-10 MED ORDER — PROMETHAZINE HCL 25 MG/ML IJ SOLN
12.5000 mg | Freq: Once | INTRAMUSCULAR | Status: AC
  Administered 2012-11-10: 12.5 mg via INTRAVENOUS
  Filled 2012-11-10: qty 1

## 2012-11-10 MED ORDER — ONDANSETRON HCL 4 MG/2ML IJ SOLN
4.0000 mg | Freq: Once | INTRAMUSCULAR | Status: AC
  Administered 2012-11-10: 4 mg via INTRAVENOUS
  Filled 2012-11-10: qty 2

## 2012-11-10 MED ORDER — PROMETHAZINE HCL 25 MG PO TABS: 25.0000 mg | ORAL_TABLET | Freq: Four times a day (QID) | ORAL | Status: DC | PRN

## 2012-11-10 NOTE — ED Provider Notes (Signed)
CSN: [id number]     Arrival date & time 11/10/12  1216 History   First MD Initiated Contact with Patient 11/10/12 [phone]     Chief Complaint  Patient presents with  . Abdominal Pain   (Consider location/radiation/quality/duration/timing/severity/associated sxs/prior Treatment) HPI Adrienne Flores is a 47 y.o. female who presents to emergency department complaining of nausea vomiting diarrhea. Patient states that she has this problem for 7 years. States that "attacks"that involve nausea, vomiting, diarrhea have been coming and going since. Patient states that during these the only thing that helps her is IV fluids, Phenergan, IV Dilaudid. States she was seen a week ago for the same at [facility]. States that she has attacks sometimes weekly, sometimes monthly, sometimes, not for a year.  States this episode started last night. Patient states that she is unable to keep things down. States diarrhea is watery. States she's having severe abdominal pain in the lower abdomen. States has history of hysterectomy not having any urinary symptoms. Patient denies any fever, chills, malaise. She has not tried taking any medications for this. Nothing making her symptoms better or worse.    Past Medical History  Diagnosis Date  . Rheumatoid arthritis   . Gastroparesis   . Degenerative disc disease   . Fibromyalgia   . Diverticulitis   . Thyroid disease    History reviewed. No pertinent past surgical history. History reviewed. No pertinent family history. History  Substance Use Topics  . Smoking status: Never Smoker   . Smokeless tobacco: Not on file  . Alcohol Use: No   OB History   Grav Para Term Preterm Abortions TAB SAB Ect Mult Living                 Review of Systems  Constitutional: Negative for fever and chills.  HENT: Negative for neck pain and neck stiffness.   Respiratory: Negative for cough, chest tightness and shortness of breath.   Cardiovascular: Negative for chest pain,  palpitations and leg swelling.  Gastrointestinal: Positive for nausea, vomiting, abdominal pain and diarrhea.  Genitourinary: Negative for dysuria, flank pain, vaginal bleeding, vaginal discharge, vaginal pain and pelvic pain.  Musculoskeletal: Negative for myalgias and arthralgias.  Skin: Negative for rash.  Neurological: Negative for dizziness, weakness and headaches.  All other systems reviewed and are negative.    Allergies  Methotrexate derivatives  Home Medications   Current Outpatient Rx  Name  Route  Sig  Dispense  Refill  . ALPRAZolam (XANAX) 0.25 MG tablet   Oral   Take 0.25 mg by mouth 3 (three) times daily as needed for anxiety.         . gabapentin (NEURONTIN) 600 MG tablet   Oral   Take 600 mg by mouth 3 (three) times daily.         [name] levothyroxine (SYNTHROID, LEVOTHROID) 200 MCG tablet   Oral   Take 200 mcg by mouth daily before breakfast.         . oxycodone (OXY-IR) 5 MG capsule   Oral   Take 5 mg by mouth every 4 (four) hours as needed.          . tizanidine (ZANAFLEX) 2 MG capsule   Oral   Take 4 mg by mouth 3 (three) times daily as needed.           BP 165/125  Pulse 110  Temp(Src) 98.4 F (36.9 C) (Oral)  Resp 18  Ht 5\' 1"  (1.549 m)  Wt 192  lb (87.091 kg)  BMI 36.3 kg/m2  SpO2 100% Physical Exam  Nursing note and vitals reviewed. Constitutional: She appears well-developed and well-nourished. No distress.  HENT:  Head: Normocephalic.  Eyes: Conjunctivae are normal.  Neck: Neck supple.  Cardiovascular: Normal rate, regular rhythm and normal heart sounds.   Pulmonary/Chest: Effort normal and breath sounds normal. No respiratory distress. She has no wheezes. She has no rales.  Abdominal: Soft. Bowel sounds are normal. She exhibits no distension. There is tenderness. There is no rebound and no guarding.  LLQ and RLQ tenderness  Musculoskeletal: She exhibits no edema.  Neurological: She is alert.  Skin: Skin is warm and dry.   Psychiatric: She has a normal mood and affect. Her behavior is normal.    ED Course  Procedures (including critical care time) Labs Review Labs Reviewed  CBC WITH DIFFERENTIAL - Abnormal; Notable for the following:    RDW 15.6 (*)    All other components within normal limits  COMPREHENSIVE METABOLIC PANEL - Abnormal; Notable for the following:    Glucose, Bld 102 (*)    Total Protein 9.3 (*)    ALT 40 (*)    Alkaline Phosphatase 136 (*)    All other components within normal limits  URINALYSIS, ROUTINE W REFLEX MICROSCOPIC - Abnormal; Notable for the following:    APPearance HAZY (*)    Bilirubin Urine SMALL (*)    Leukocytes, UA MODERATE (*)    All other components within normal limits  URINE RAPID DRUG SCREEN (HOSP PERFORMED) - Abnormal; Notable for the following:    Benzodiazepines POSITIVE (*)    All other components within normal limits  URINE MICROSCOPIC-ADD ON - Abnormal; Notable for the following:    Squamous Epithelial / LPF MANY (*)    Bacteria, UA MANY (*)    All other components within normal limits  URINE CULTURE  LIPASE, BLOOD   Imaging Review No results found.  MDM   1. Abdominal pain   2. Nausea & vomiting       Patient with acute exacerbation of chronic abdominal pain, nausea, vomiting. She states that IV fluids, Dilaudid, Phenergan helped her pain. We'll get lab work, start fluids, will try Dilaudid and Zofran initially.  6:14 PM Patient received 1 L of normal saline. She received a total of 2 doses of 1 mg of IV Dilaudid, 4 mg of Zofran. Patient states her pain is improving however continues to have nausea. Patient states she wants Phenergan. Her lab work shows slightly elevated ALT at 40, elevated alkaline phosphatase at 136, otherwise all normal. Her urine does have many bacteria and 7-10 white blood cells. Rocephin ordered for the infection, culture sent. Will continue to hydrate and we'll try Phenergan for her nausea  8:00 PM Patient reassessed.  She has now had 3 doses of IV Dilaudid, Joseph 12.5 mg of Phenergan, 4 mg of Zofran. She is feeling much better. Her nausea and vomiting resolved. Her pain has resolved. She is tolerating by mouth fluids. She feels much better and wants to be discharged home. She will continue her pain medications at home, she will be prescribed Phenergan for nausea. She is to follow with her primary care doctor. She is instructed to return if worsening.   Filed Vitals:   11/10/12 1730 11/10/12 1830 11/10/12 1900 11/10/12 1930  BP: 144/98 132/81 113/85 131/90  Pulse: 71 77 64 70  Temp:      TempSrc:      Resp:  Height:      Weight:      SpO2: 99% 100% 98% 95%     Lottie Mussel, PA-C 11/10/12 2006

## 2012-11-10 NOTE — ED Notes (Signed)
Pt reports recurrent episodes of abdominal pain with n/v. Last one appx 3 weeks ago. This episode started yesterday. Pt reports that "the only thing that helps is dilaudid and phenergan".

## 2012-11-10 NOTE — ED Notes (Signed)
Pt reports a 7 year history of n/v/d but symptoms have gotten worse since last night. States she thinks its because she has RA and is immunocompromised.

## 2012-11-11 ENCOUNTER — Ambulatory Visit: Payer: Self-pay | Admitting: Pain Medicine

## 2012-11-11 LAB — URINE CULTURE

## 2012-11-11 NOTE — ED Provider Notes (Signed)
Medical screening examination/treatment/procedure(s) were performed by non-physician practitioner and as supervising physician I was immediately available for consultation/collaboration.   Megan E Docherty, MD 11/11/12 1219 

## 2012-11-17 ENCOUNTER — Ambulatory Visit: Payer: Self-pay | Admitting: Pain Medicine

## 2012-11-22 ENCOUNTER — Encounter (HOSPITAL_COMMUNITY): Payer: Self-pay | Admitting: Emergency Medicine

## 2012-11-22 ENCOUNTER — Emergency Department (HOSPITAL_COMMUNITY)
Admission: EM | Admit: 2012-11-22 | Discharge: 2012-11-22 | Disposition: A | Discharge status: Home / Self Care | Payer: Medicare Other | Attending: Emergency Medicine | Admitting: Emergency Medicine

## 2012-11-22 DIAGNOSIS — R112 Nausea with vomiting, unspecified: Secondary | ICD-10-CM | POA: Insufficient documentation

## 2012-11-22 DIAGNOSIS — M069 Rheumatoid arthritis, unspecified: Secondary | ICD-10-CM | POA: Insufficient documentation

## 2012-11-22 DIAGNOSIS — G8929 Other chronic pain: Secondary | ICD-10-CM | POA: Insufficient documentation

## 2012-11-22 DIAGNOSIS — R197 Diarrhea, unspecified: Secondary | ICD-10-CM | POA: Insufficient documentation

## 2012-11-22 DIAGNOSIS — E079 Disorder of thyroid, unspecified: Secondary | ICD-10-CM | POA: Insufficient documentation

## 2012-11-22 DIAGNOSIS — Z79899 Other long term (current) drug therapy: Secondary | ICD-10-CM | POA: Insufficient documentation

## 2012-11-22 DIAGNOSIS — R1084 Generalized abdominal pain: Secondary | ICD-10-CM | POA: Insufficient documentation

## 2012-11-22 DIAGNOSIS — IMO0002 Reserved for concepts with insufficient information to code with codable children: Secondary | ICD-10-CM | POA: Insufficient documentation

## 2012-11-22 DIAGNOSIS — Z90710 Acquired absence of both cervix and uterus: Secondary | ICD-10-CM | POA: Insufficient documentation

## 2012-11-22 DIAGNOSIS — Z9089 Acquired absence of other organs: Secondary | ICD-10-CM | POA: Insufficient documentation

## 2012-11-22 DIAGNOSIS — Z8719 Personal history of other diseases of the digestive system: Secondary | ICD-10-CM | POA: Insufficient documentation

## 2012-11-22 DIAGNOSIS — IMO0001 Reserved for inherently not codable concepts without codable children: Secondary | ICD-10-CM | POA: Insufficient documentation

## 2012-11-22 HISTORY — DX: Systemic lupus erythematosus, unspecified: M32.9

## 2012-11-22 HISTORY — DX: Reserved for concepts with insufficient information to code with codable children: IMO0002

## 2012-11-22 LAB — CBC WITH DIFFERENTIAL/PLATELET
Basophils Relative: 0 % (ref 0–1)
Eosinophils Relative: 1 % (ref 0–5)
Lymphocytes Relative: 23 % (ref 12–46)
MCH: 31.7 pg (ref 26.0–34.0)
MCHC: 35.4 g/dL (ref 30.0–36.0)
MCV: 89.6 fL (ref 78.0–100.0)
Monocytes Absolute: 0.3 10*3/uL (ref 0.1–1.0)
Platelets: 262 10*3/uL (ref 150–400)
RDW: 14.1 % (ref 11.5–15.5)

## 2012-11-22 LAB — URINALYSIS, ROUTINE W REFLEX MICROSCOPIC
Nitrite: NEGATIVE
Protein, ur: NEGATIVE mg/dL
Specific Gravity, Urine: 1.008 (ref 1.005–1.030)
pH: 5.5 (ref 5.0–8.0)

## 2012-11-22 LAB — COMPREHENSIVE METABOLIC PANEL
Albumin: 4.5 g/dL (ref 3.5–5.2)
Alkaline Phosphatase: 124 U/L — ABNORMAL HIGH (ref 39–117)
BUN: 12 mg/dL (ref 6–23)
CO2: 21 mEq/L (ref 19–32)
Calcium: 10 mg/dL (ref 8.4–10.5)
GFR calc non Af Amer: 81 mL/min — ABNORMAL LOW (ref >90)
Glucose, Bld: 93 mg/dL (ref 70–99)
Total Bilirubin: 0.4 mg/dL (ref 0.3–1.2)
Total Protein: 9.1 g/dL — ABNORMAL HIGH (ref 6.0–8.3)

## 2012-11-22 LAB — URINE MICROSCOPIC-ADD ON

## 2012-11-22 MED ORDER — ONDANSETRON HCL 4 MG/2ML IJ SOLN
4.0000 mg | INTRAMUSCULAR | Status: AC
  Administered 2012-11-22: 4 mg via INTRAVENOUS
  Filled 2012-11-22: qty 2

## 2012-11-22 MED ORDER — ONDANSETRON HCL 4 MG/2ML IJ SOLN: 4.0000 mg | INTRAMUSCULAR | Status: DC

## 2012-11-22 MED ORDER — HYDROMORPHONE HCL PF 1 MG/ML IJ SOLN
1.0000 mg | Freq: Once | INTRAMUSCULAR | Status: AC
  Administered 2012-11-22: 1 mg via INTRAVENOUS
  Filled 2012-11-22: qty 1

## 2012-11-22 MED ORDER — PROMETHAZINE HCL 12.5 MG PO TABS
12.5000 mg | ORAL_TABLET | ORAL | Status: AC
  Administered 2012-11-22: 12.5 mg via ORAL
  Filled 2012-11-22: qty 1

## 2012-11-22 MED ORDER — PROMETHAZINE HCL 25 MG PO TABS: 25.0000 mg | ORAL_TABLET | Freq: Four times a day (QID) | ORAL | Status: DC | PRN

## 2012-11-22 MED ORDER — HYDROMORPHONE HCL PF 1 MG/ML IJ SOLN
1.0000 mg | Freq: Once | INTRAMUSCULAR | Status: AC
  Administered 2012-11-22: 1 mg via INTRAMUSCULAR
  Filled 2012-11-22: qty 1

## 2012-11-22 MED ORDER — ONDANSETRON 4 MG PO TBDP
8.0000 mg | ORAL_TABLET | Freq: Once | ORAL | Status: AC
  Administered 2012-11-22: 8 mg via ORAL
  Filled 2012-11-22: qty 2

## 2012-11-22 MED ORDER — HYDROMORPHONE HCL PF 1 MG/ML IJ SOLN: 1.0000 mg | Freq: Once | INTRAMUSCULAR | Status: DC

## 2012-11-22 MED ORDER — OXYCODONE-ACETAMINOPHEN 5-325 MG PO TABS: 1.0000 | ORAL_TABLET | Freq: Four times a day (QID) | ORAL | Status: DC | PRN

## 2012-11-22 MED ORDER — SODIUM CHLORIDE 0.9 % IV BOLUS (SEPSIS)
1000.0000 mL | Freq: Once | INTRAVENOUS | Status: AC
  Administered 2012-11-22: 1000 mL via INTRAVENOUS

## 2012-11-22 NOTE — ED Notes (Signed)
Pt reports lower abdominal pain with n/v/d onset today. Pt has history of diverticulitis but pain is worse today. Pt denies change in diet or being around others with illness.

## 2012-11-22 NOTE — ED Notes (Signed)
Family at bedside. 

## 2012-11-22 NOTE — ED Notes (Signed)
PA at bedside.

## 2012-11-22 NOTE — ED Provider Notes (Signed)
CSN: [id number]     Arrival date & time 11/22/12  4098 History   First MD Initiated Contact with Patient 11/22/12 [phone]     Chief Complaint  Patient presents with  . Abdominal Pain  . Nausea  . Emesis  . Diarrhea   (Consider location/radiation/quality/duration/timing/severity/associated sxs/prior Treatment) HPI Comments: Patient is a 47 year old female with a history of gastroparesis, diverticulitis, and fibromyalgia who presents for abdominal pain with associated nausea, vomiting, and diarrhea with onset this morning. Patient states that she has had similar abdominal pain for the past 8 years. She states that this pain feels similar. Patient states she took oxycodone for her pain with mild relief. Pain is stabbing in nature and has been constant since onset, radiating between her L and R abdomen. Last episode of emesis and diarrhea were 1 hour PTA. She denies associated fever, CP, SOB, melena, hematochezia, hematemesis, urinary symptoms, vaginal complaints, and numbness/tingling. She denies a history of abdominal surgeries and states her last colonoscopy was one year ago; she has not had an endoscopy "for some time". She is not currently followed by GI in [place], per patient. SHx significant for cholecystectomy, abdominal hysterectomy, and cesarean section.  The history is provided by the patient. No language interpreter was used.    Past Medical History  Diagnosis Date  . Rheumatoid arthritis   . Gastroparesis   . Degenerative disc disease   . Fibromyalgia   . Diverticulitis   . Thyroid disease   . Lupus    Past Surgical History  Procedure Laterality Date  . Cesarean section    . Replacement total knee Bilateral   . Abdominal hysterectomy    . Cholecystectomy     No family history on file. History  Substance Use Topics  . Smoking status: Never Smoker   . Smokeless tobacco: Not on file  . Alcohol Use: No   OB History   Grav Para Term Preterm Abortions TAB SAB Ect Mult Living                 Review of Systems  Constitutional: Negative for fever.  Respiratory: Negative for shortness of breath.   Cardiovascular: Negative for chest pain.  Gastrointestinal: Positive for nausea, vomiting (NB/NB), abdominal pain and diarrhea. Negative for blood in stool.  Genitourinary: Negative for dysuria, hematuria, vaginal bleeding and vaginal discharge.  Neurological: Negative for weakness and numbness.  All other systems reviewed and are negative.    Allergies  Methotrexate derivatives  Home Medications   Current Outpatient Rx  Name  Route  Sig  Dispense  Refill  . ALPRAZolam (XANAX) 0.25 MG tablet   Oral   Take 0.25 mg by mouth 3 (three) times daily as needed for anxiety.         . gabapentin (NEURONTIN) 600 MG tablet   Oral   Take 600 mg by mouth 3 (three) times daily.         [name] levothyroxine (SYNTHROID, LEVOTHROID) 200 MCG tablet   Oral   Take 200 mcg by mouth daily before breakfast.         . naproxen sodium (ANAPROX) 220 MG tablet   Oral   Take 220 mg by mouth daily as needed (for arthritis).         [name] oxycodone (OXY-IR) 5 MG capsule   Oral   Take 5 mg by mouth every 4 (four) hours as needed.          . tizanidine (ZANAFLEX) 2 MG capsule  Oral   Take 4 mg by mouth 3 (three) times daily as needed.          [name] oxyCODONE-acetaminophen (PERCOCET/ROXICET) 5-325 MG per tablet   Oral   Take 1-2 tablets by mouth every 6 (six) hours as needed for pain.   17 tablet   0   . promethazine (PHENERGAN) 25 MG tablet   Oral   Take 1 tablet (25 mg total) by mouth every 6 (six) hours as needed for nausea.   20 tablet   0    BP 108/74  Pulse 55  Temp(Src) 98 F (36.7 C) (Oral)  Resp 18  Ht 5\' 1"  (1.549 m)  Wt 182 lb (82.555 kg)  BMI 34.41 kg/m2  SpO2 100%  Physical Exam  Nursing note and vitals reviewed. Constitutional: She is oriented to person, place, and time. She appears well-developed and well-nourished. No distress.  HENT:  Head:  Normocephalic and atraumatic.  Eyes: Conjunctivae and EOM are normal. No scleral icterus.  Neck: Normal range of motion.  Cardiovascular: Normal rate, regular rhythm and normal heart sounds.   Pulmonary/Chest: Effort normal. No respiratory distress. She has no wheezes. She has no rales.  Abdominal: Soft. She exhibits no mass. There is tenderness (Diffuse, nonfocal). There is no rebound and no guarding.  No peritoneal signs or evidence of acute surgical abdomen  Musculoskeletal: Normal range of motion.  Neurological: She is alert and oriented to person, place, and time.  Skin: Skin is warm and dry. No rash noted. She is not diaphoretic. No erythema. No pallor.  Psychiatric: She has a normal mood and affect. Her behavior is normal.    ED Course  Procedures (including critical care time) Labs Review Labs Reviewed  COMPREHENSIVE METABOLIC PANEL - Abnormal; Notable for the following:    Total Protein 9.1 (*)    Alkaline Phosphatase 124 (*)    GFR calc non Af Amer 81 (*)    All other components within normal limits  URINALYSIS, ROUTINE W REFLEX MICROSCOPIC - Abnormal; Notable for the following:    APPearance CLOUDY (*)    Leukocytes, UA MODERATE (*)    All other components within normal limits  URINE MICROSCOPIC-ADD ON - Abnormal; Notable for the following:    Squamous Epithelial / LPF FEW (*)    All other components within normal limits  URINE CULTURE  CBC WITH DIFFERENTIAL  LIPASE, BLOOD   Imaging Review No results found.  EKG Interpretation   None       MDM   1. Chronic abdominal pain     Patient with a hx of chronic abdominal pain presents for abdominal pain with N/V/D. Was seen for same on 11/10/12. Physical exam without any focal TTP, peritoneal signs, or involuntary guarding. Will evaluate with CBC, CMP, lipase, and UA.  Labs stable and c/w priors. UA nonsuggestive of infection. Urine culture pending. Patient has been able to tolerate PO fluids and medication without  emesis while in ED today. Reexamination of the abdomen with mildly improved diffuse, nonfocal tenderness. As pain and associated symptoms similar to past episodes of same x 8 years and tenderness non-focal and improved, do not believe further work up with imaging is indicated today. Patient states pain has improved since onset after 3 doses of Dilaudid, Zofran x 2, phenergan, and IVF. Patient agreeable to managing symptoms further at home. She is appropriate for discharge with prescriptions for Phenergan and Percocet for symptoms. And also given referral to gastroenterology in [facility] for further evaluation  of symptoms. Return precautions discussed and patient agreeable to plan of unaddressed concerns.     Antony Madura, PA-C 11/22/12 1614

## 2012-11-23 ENCOUNTER — Emergency Department (HOSPITAL_COMMUNITY)
Admission: EM | Admit: 2012-11-23 | Discharge: 2012-11-24 | Disposition: A | Discharge status: Home / Self Care | Payer: Medicare Other | Source: Home / Self Care | Attending: Emergency Medicine | Admitting: Emergency Medicine

## 2012-11-23 ENCOUNTER — Encounter (HOSPITAL_COMMUNITY): Payer: Self-pay | Admitting: Emergency Medicine

## 2012-11-23 DIAGNOSIS — R1084 Generalized abdominal pain: Secondary | ICD-10-CM | POA: Insufficient documentation

## 2012-11-23 DIAGNOSIS — Z8719 Personal history of other diseases of the digestive system: Secondary | ICD-10-CM | POA: Insufficient documentation

## 2012-11-23 DIAGNOSIS — R112 Nausea with vomiting, unspecified: Secondary | ICD-10-CM | POA: Insufficient documentation

## 2012-11-23 DIAGNOSIS — E079 Disorder of thyroid, unspecified: Secondary | ICD-10-CM | POA: Insufficient documentation

## 2012-11-23 DIAGNOSIS — Z79899 Other long term (current) drug therapy: Secondary | ICD-10-CM | POA: Insufficient documentation

## 2012-11-23 DIAGNOSIS — R630 Anorexia: Secondary | ICD-10-CM | POA: Insufficient documentation

## 2012-11-23 DIAGNOSIS — IMO0001 Reserved for inherently not codable concepts without codable children: Secondary | ICD-10-CM | POA: Insufficient documentation

## 2012-11-23 DIAGNOSIS — M069 Rheumatoid arthritis, unspecified: Secondary | ICD-10-CM | POA: Insufficient documentation

## 2012-11-23 DIAGNOSIS — G8929 Other chronic pain: Secondary | ICD-10-CM

## 2012-11-23 MED ORDER — HYDROMORPHONE HCL PF 1 MG/ML IJ SOLN
1.0000 mg | Freq: Once | INTRAMUSCULAR | Status: AC
  Administered 2012-11-24: 1 mg via INTRAVENOUS
  Filled 2012-11-23: qty 1

## 2012-11-23 MED ORDER — IOHEXOL 300 MG/ML  SOLN
20.0000 mL | INTRAMUSCULAR | Status: AC
  Administered 2012-11-23 – 2012-11-24 (×2): 20 mL via ORAL

## 2012-11-23 MED ORDER — METOCLOPRAMIDE HCL 5 MG/ML IJ SOLN
10.0000 mg | Freq: Once | INTRAMUSCULAR | Status: AC
  Administered 2012-11-24: 10 mg via INTRAVENOUS
  Filled 2012-11-23: qty 2

## 2012-11-23 MED ORDER — HYDROMORPHONE HCL PF 1 MG/ML IJ SOLN: 1.0000 mg | INTRAMUSCULAR | Status: DC | PRN

## 2012-11-23 MED ORDER — HYDROMORPHONE HCL PF 1 MG/ML IJ SOLN: 1.0000 mg | Freq: Once | INTRAMUSCULAR | Status: DC

## 2012-11-23 MED ORDER — SODIUM CHLORIDE 0.9 % IV BOLUS (SEPSIS)
2000.0000 mL | Freq: Once | INTRAVENOUS | Status: AC
  Administered 2012-11-24: 1000 mL via INTRAVENOUS

## 2012-11-23 NOTE — ED Notes (Signed)
C/o stabbing abd pain. Seen recently in September and last night for the same. Labs reviewed. Referred to GI. C/o continued and worsening abd pain, pinpoints to periumbilical area. Denies vomiting, last pain med 4 hrs ago, alert, NAD, calm, crying, ambulatory, steady gait, here with husband.

## 2012-11-23 NOTE — ED Provider Notes (Signed)
Medical screening examination/treatment/procedure(s) were performed by non-physician practitioner and as supervising physician I was immediately available for consultation/collaboration.  Inita Uram T Toiya Morrish, MD 11/23/12 1543 

## 2012-11-23 NOTE — ED Notes (Signed)
Unable to start iv access, have paged iv team to start iv site,

## 2012-11-23 NOTE — ED Provider Notes (Signed)
CSN: [id number]     Arrival date & time 11/23/12  2157 History   First MD Initiated Contact with Patient 11/23/12 2205     Chief Complaint  Patient presents with  . Abdominal Pain   (Consider location/radiation/quality/duration/timing/severity/associated sxs/prior Treatment) Patient is a 47 y.o. female presenting with abdominal pain. The history is provided by the patient and medical records.  Abdominal Pain Pain location:  Generalized Pain quality: aching and sharp   Pain radiates to:  Does not radiate Pain severity:  Severe Onset quality:  Gradual Timing:  Intermittent Progression:  Waxing and waning Chronicity:  Chronic Context: not alcohol use, not eating, not previous surgeries, not recent illness and not retching   Relieved by: narcotics. Worsened by:  Movement and palpation Associated symptoms: anorexia, nausea and vomiting   Associated symptoms: no chest pain, no chills, no constipation, no cough, no diarrhea, no dysuria, no fever, no hematemesis, no hematochezia, no melena, no shortness of breath and no sore throat   Risk factors: has not had multiple surgeries, not obese and not pregnant     Past Medical History  Diagnosis Date  . Rheumatoid arthritis   . Gastroparesis   . Degenerative disc disease   . Fibromyalgia   . Diverticulitis   . Thyroid disease   . Lupus    Past Surgical History  Procedure Laterality Date  . Cesarean section    . Replacement total knee Bilateral   . Abdominal hysterectomy    . Cholecystectomy     History reviewed. No pertinent family history. History  Substance Use Topics  . Smoking status: Never Smoker   . Smokeless tobacco: Not on file  . Alcohol Use: No   OB History   Grav Para Term Preterm Abortions TAB SAB Ect Mult Living                 Review of Systems  Constitutional: Negative for fever and chills.  HENT: Negative for sore throat.   Respiratory: Negative for cough and shortness of breath.   Cardiovascular:  Negative for chest pain.  Gastrointestinal: Positive for nausea, vomiting, abdominal pain and anorexia. Negative for diarrhea, constipation, blood in stool, melena, hematochezia, abdominal distention and hematemesis.  Genitourinary: Negative for dysuria and difficulty urinating.  Musculoskeletal: Negative for arthralgias and myalgias.  Neurological: Negative for dizziness, weakness, light-headedness and numbness.  All other systems reviewed and are negative.    Allergies  Methotrexate derivatives  Home Medications   Current Outpatient Rx  Name  Route  Sig  Dispense  Refill  . ALPRAZolam (XANAX) 0.25 MG tablet   Oral   Take 0.25 mg by mouth 3 (three) times daily as needed for anxiety.         . gabapentin (NEURONTIN) 600 MG tablet   Oral   Take 600 mg by mouth 3 (three) times daily.         [name] levothyroxine (SYNTHROID, LEVOTHROID) 200 MCG tablet   Oral   Take 200 mcg by mouth daily before breakfast.         . Multiple Vitamin (MULTIVITAMIN WITH MINERALS) TABS tablet   Oral   Take 1 tablet by mouth daily.         . naproxen sodium (ANAPROX) 220 MG tablet   Oral   Take 220 mg by mouth daily as needed (for arthritis).         [name] oxycodone (OXY-IR) 5 MG capsule   Oral   Take 5 mg by mouth every 4 (  four) hours as needed for pain.          [name] oxyCODONE-acetaminophen (PERCOCET/ROXICET) 5-325 MG per tablet   Oral   Take 1-2 tablets by mouth every 6 (six) hours as needed for pain.   17 tablet   0   . promethazine (PHENERGAN) 25 MG tablet   Oral   Take 1 tablet (25 mg total) by mouth every 6 (six) hours as needed for nausea.   20 tablet   0   . tizanidine (ZANAFLEX) 2 MG capsule   Oral   Take 4 mg by mouth 3 (three) times daily as needed.           BP 162/106  Pulse 78  Temp(Src) 98.8 F (37.1 C) (Oral)  Resp 18  Wt 189 lb 6.4 oz (85.911 kg)  BMI 35.81 kg/m2  SpO2 100% Physical Exam  Nursing note and vitals reviewed. Constitutional: She is oriented  to person, place, and time. She appears well-developed and well-nourished. She appears distressed (appears uncomfortable).  Tearful  HENT:  Head: Normocephalic and atraumatic.  Mouth/Throat: Oropharynx is clear and moist. No oropharyngeal exudate.  Eyes: Conjunctivae and EOM are normal. Pupils are equal, round, and reactive to light.  Neck: Normal range of motion. Neck supple.  Cardiovascular: Normal rate, regular rhythm and normal heart sounds.  Exam reveals no gallop and no friction rub.   No murmur heard. Pulmonary/Chest: Effort normal and breath sounds normal. No respiratory distress. She has no wheezes. She has no rales. She exhibits no tenderness.  Abdominal: Soft. Bowel sounds are normal. She exhibits no distension. There is generalized tenderness. There is no rigidity, no rebound, no guarding, no CVA tenderness, no tenderness at McBurney's point and negative Murphy's sign.  obese  Musculoskeletal: Normal range of motion. She exhibits no edema and no tenderness.  Lymphadenopathy:    She has no cervical adenopathy.  Neurological: She is alert and oriented to person, place, and time.  Skin: Skin is warm and dry. No rash noted. She is not diaphoretic.  Psychiatric: She has a normal mood and affect. Her behavior is normal. Judgment and thought content normal.    ED Course  Procedures (including critical care time)  Angiocath insertion Performed by: Dorna Leitz  Consent: Verbal consent obtained. Risks and benefits: risks, benefits and alternatives were discussed Time out: Immediately prior to procedure a "time out" was called to verify the correct patient, procedure, equipment, support staff and site/side marked as required.  Preparation: Patient was prepped and draped in the usual sterile fashion.  Vein Location: Right AC  Ultrasound Guided  Gauge: 20  Normal blood return and flush without difficulty Patient tolerance: Patient tolerated the procedure well with no immediate  complications.  Labs Review Labs Reviewed  CBC WITH DIFFERENTIAL  COMPREHENSIVE METABOLIC PANEL  LIPASE, BLOOD  URINALYSIS, ROUTINE W REFLEX MICROSCOPIC   Imaging Review No results found.  EKG Interpretation   None       MDM   1. Chronic abdominal pain     The patient is a 47 year old female with a history of chronic pain who presents with abdominal pain. Patient states she has sharp pains in her bilateral periumbilical, right and left flanks that has been waxing and waning for 8 years now. Patient also has chronic back pain for which she takes oxycodone and is prescribed #180 5 mg tablets every month. Patient has been seen in this emergency department 2 times over the last 2 weeks with negative laboratory workup, improvement  in pain with IV Dilaudid, Zofran, Phenergan, fluids. Patient returns today with similar symptoms, nausea, vomiting, abdominal pain. No diarrhea. No vaginal complaints. No fevers.  On arrival patient is afebrile, hemodynamically stable. Tearful, but with benign abdominal exam. No peritoneal signs. Very low suspicion for appendicitis, obstruction, nephrolithiasis in this patient with an extensive past medical history of similar abdominal pain. Will treat pain, hydrate, evaluate  with abdominal labs and urinalysis. Review of records from [facility] and [facility] show multiple ED visits for various pain complaints, approximately every week for the last few months. However, most recent imaging of abdomen and pelvis was in May of 2014. Based on inability to definitively rule out intra-abdominal pathology in this patient with multiple previous abdominal surgeries, will obtain CT abdomen and pelvis.  Access obtained by me for CT scan as above and blood work drawn by phlebotomist. Patient transfer of care to Dr. Norlene Campbell with patient awaiting CT scan pending for dispo. If negative, feel this is likely chronic abdominal pain and patient can be discharged home with GI referral (which she was  given yesterday).  Patient was discussed with my attending, Dr. Romeo Apple.    Dorna Leitz, MD 11/23/12 2352

## 2012-11-24 ENCOUNTER — Emergency Department (HOSPITAL_COMMUNITY): Payer: Medicare Other

## 2012-11-24 ENCOUNTER — Encounter (HOSPITAL_COMMUNITY): Payer: Self-pay | Admitting: Radiology

## 2012-11-24 LAB — URINALYSIS, ROUTINE W REFLEX MICROSCOPIC
Bilirubin Urine: NEGATIVE
Glucose, UA: NEGATIVE mg/dL
Hgb urine dipstick: NEGATIVE
Ketones, ur: NEGATIVE mg/dL
Nitrite: NEGATIVE
Protein, ur: NEGATIVE mg/dL
Specific Gravity, Urine: 1.012 (ref 1.005–1.030)
Urobilinogen, UA: 0.2 mg/dL (ref 0.0–1.0)
pH: 5.5 (ref 5.0–8.0)

## 2012-11-24 LAB — COMPREHENSIVE METABOLIC PANEL WITH GFR
ALT: 15 U/L (ref 0–35)
AST: 19 U/L (ref 0–37)
Albumin: 3.9 g/dL (ref 3.5–5.2)
Alkaline Phosphatase: 96 U/L (ref 39–117)
BUN: 15 mg/dL (ref 6–23)
CO2: 19 meq/L (ref 19–32)
Calcium: 8.6 mg/dL (ref 8.4–10.5)
Chloride: 102 meq/L (ref 96–112)
Creatinine, Ser: 0.95 mg/dL (ref 0.50–1.10)
GFR calc Af Amer: 80 mL/min — ABNORMAL LOW
GFR calc non Af Amer: 69 mL/min — ABNORMAL LOW
Glucose, Bld: 98 mg/dL (ref 70–99)
Potassium: 3.6 meq/L (ref 3.5–5.1)
Sodium: 135 meq/L (ref 135–145)
Total Bilirubin: 0.6 mg/dL (ref 0.3–1.2)
Total Protein: 7.5 g/dL (ref 6.0–8.3)

## 2012-11-24 LAB — CBC WITH DIFFERENTIAL/PLATELET
Basophils Absolute: 0 10*3/uL (ref 0.0–0.1)
Basophils Relative: 0 % (ref 0–1)
Eosinophils Absolute: 0.1 10*3/uL (ref 0.0–0.7)
Eosinophils Relative: 1 % (ref 0–5)
HCT: 36.8 % (ref 36.0–46.0)
Hemoglobin: 12.4 g/dL (ref 12.0–15.0)
Lymphocytes Relative: 29 % (ref 12–46)
Lymphs Abs: 2 10*3/uL (ref 0.7–4.0)
MCH: 30.7 pg (ref 26.0–34.0)
MCHC: 33.7 g/dL (ref 30.0–36.0)
MCV: 91.1 fL (ref 78.0–100.0)
Monocytes Absolute: 0.4 10*3/uL (ref 0.1–1.0)
Monocytes Relative: 6 % (ref 3–12)
Neutro Abs: 4.2 10*3/uL (ref 1.7–7.7)
Neutrophils Relative %: 63 % (ref 43–77)
Platelets: 232 10*3/uL (ref 150–400)
RBC: 4.04 MIL/uL (ref 3.87–5.11)
RDW: 14.2 % (ref 11.5–15.5)
WBC: 6.7 10*3/uL (ref 4.0–10.5)

## 2012-11-24 LAB — URINE MICROSCOPIC-ADD ON

## 2012-11-24 LAB — LIPASE, BLOOD: Lipase: 17 U/L (ref 11–59)

## 2012-11-24 MED ORDER — HYDROMORPHONE HCL PF 1 MG/ML IJ SOLN: 1.0000 mg | Freq: Once | INTRAMUSCULAR | Status: DC

## 2012-11-24 MED ORDER — LORAZEPAM 2 MG/ML IJ SOLN
1.0000 mg | Freq: Once | INTRAMUSCULAR | Status: AC
  Administered 2012-11-24: 1 mg via INTRAVENOUS
  Filled 2012-11-24: qty 1

## 2012-11-24 MED ORDER — IOHEXOL 300 MG/ML  SOLN
100.0000 mL | Freq: Once | INTRAMUSCULAR | Status: AC | PRN
  Administered 2012-11-24: 100 mL via INTRAVENOUS

## 2012-11-24 MED ORDER — HYDROMORPHONE HCL PF 2 MG/ML IJ SOLN: 2.0000 mg | Freq: Once | INTRAMUSCULAR | Status: DC

## 2012-11-24 MED ORDER — POLYETHYLENE GLYCOL 3350 17 G PO PACK: 17.0000 g | PACK | Freq: Every day | ORAL | Status: DC

## 2012-11-24 MED ORDER — DICYCLOMINE HCL 20 MG PO TABS: 20.0000 mg | ORAL_TABLET | Freq: Four times a day (QID) | ORAL | Status: DC | PRN

## 2012-11-24 MED ORDER — PROMETHAZINE HCL 25 MG RE SUPP: 25.0000 mg | Freq: Four times a day (QID) | RECTAL | Status: DC | PRN

## 2012-11-24 MED ORDER — ONDANSETRON 8 MG PO TBDP: 8.0000 mg | ORAL_TABLET | Freq: Three times a day (TID) | ORAL | Status: DC | PRN

## 2012-11-24 MED ORDER — DICYCLOMINE HCL 10 MG/ML IM SOLN
20.0000 mg | Freq: Once | INTRAMUSCULAR | Status: AC
  Administered 2012-11-24: 20 mg via INTRAMUSCULAR
  Filled 2012-11-24: qty 2

## 2012-11-24 MED ORDER — HYDROMORPHONE HCL PF 1 MG/ML IJ SOLN
1.0000 mg | Freq: Once | INTRAMUSCULAR | Status: DC
  Filled 2012-11-24: qty 1

## 2012-11-24 MED ORDER — HYDROMORPHONE HCL PF 1 MG/ML IJ SOLN
1.0000 mg | Freq: Once | INTRAMUSCULAR | Status: AC
  Administered 2012-11-24: 1 mg via INTRAMUSCULAR
  Filled 2012-11-24: qty 1

## 2012-11-24 MED ORDER — HYDROMORPHONE HCL PF 1 MG/ML IJ SOLN
1.0000 mg | Freq: Once | INTRAMUSCULAR | Status: AC
  Administered 2012-11-24: 1 mg via INTRAVENOUS
  Filled 2012-11-24: qty 1

## 2012-11-24 NOTE — ED Provider Notes (Addendum)
Care assumed at change of shift from prior team.  Patient with acute on chronic abdominal pain.  She was seen yesterday for similar presentation.  Patient per chart.  Has not had CT scan since June.  Plan is for CT scan of abdomen, pelvis.  She's been a difficult IV stick.  She is pending labs.  Expect if  CT scan unremarkable, to be DC home to followup with GI as previously arranged.  Olivia Mackie, MD 11/24/12 0309  4:11 AM Results for orders placed during the hospital encounter of 11/23/12  CBC WITH DIFFERENTIAL      Result Value Range   WBC 6.7  4.0 - 10.5 K/uL   RBC 4.04  3.87 - 5.11 MIL/uL   Hemoglobin 12.4  12.0 - 15.0 g/dL   HCT 56.2  13.0 - 86.5 %   MCV 91.1  78.0 - 100.0 fL   MCH 30.7  26.0 - 34.0 pg   MCHC 33.7  30.0 - 36.0 g/dL   RDW 78.4  69.6 - 29.5 %   Platelets 232  150 - 400 K/uL   Neutrophils Relative % 63  43 - 77 %   Neutro Abs 4.2  1.7 - 7.7 K/uL   Lymphocytes Relative 29  12 - 46 %   Lymphs Abs 2.0  0.7 - 4.0 K/uL   Monocytes Relative 6  3 - 12 %   Monocytes Absolute 0.4  0.1 - 1.0 K/uL   Eosinophils Relative 1  0 - 5 %   Eosinophils Absolute 0.1  0.0 - 0.7 K/uL   Basophils Relative 0  0 - 1 %   Basophils Absolute 0.0  0.0 - 0.1 K/uL  COMPREHENSIVE METABOLIC PANEL      Result Value Range   Sodium 135  135 - 145 mEq/L   Potassium 3.6  3.5 - 5.1 mEq/L   Chloride 102  96 - 112 mEq/L   CO2 19  19 - 32 mEq/L   Glucose, Bld 98  70 - 99 mg/dL   BUN 15  6 - 23 mg/dL   Creatinine, Ser 2.84  0.50 - 1.10 mg/dL   Calcium 8.6  8.4 - 13.2 mg/dL   Total Protein 7.5  6.0 - 8.3 g/dL   Albumin 3.9  3.5 - 5.2 g/dL   AST 19  0 - 37 U/L   ALT 15  0 - 35 U/L   Alkaline Phosphatase 96  39 - 117 U/L   Total Bilirubin 0.6  0.3 - 1.2 mg/dL   GFR calc non Af Amer 69 (*) >90 mL/min   GFR calc Af Amer 80 (*) >90 mL/min  LIPASE, BLOOD      Result Value Range   Lipase 17  11 - 59 U/L  URINALYSIS, ROUTINE W REFLEX MICROSCOPIC      Result Value Range   Color, Urine YELLOW   YELLOW   APPearance CLEAR  CLEAR   Specific Gravity, Urine 1.012  1.005 - 1.030   pH 5.5  5.0 - 8.0   Glucose, UA NEGATIVE  NEGATIVE mg/dL   Hgb urine dipstick NEGATIVE  NEGATIVE   Bilirubin Urine NEGATIVE  NEGATIVE   Ketones, ur NEGATIVE  NEGATIVE mg/dL   Protein, ur NEGATIVE  NEGATIVE mg/dL   Urobilinogen, UA 0.2  0.0 - 1.0 mg/dL   Nitrite NEGATIVE  NEGATIVE   Leukocytes, UA SMALL (*) NEGATIVE  URINE MICROSCOPIC-ADD ON      Result Value Range   Squamous Epithelial / LPF  FEW (*) RARE   WBC, UA 3-6  <3 WBC/hpf   RBC / HPF 0-2  <3 RBC/hpf   Bacteria, UA FEW (*) RARE   Ct Abdomen Pelvis W Contrast  11/24/2012   *RADIOLOGY REPORT*  Clinical Data: Abdominal pain.  CT ABDOMEN AND PELVIS WITH CONTRAST  Technique:  Multidetector CT imaging of the abdomen and pelvis was performed following the standard protocol during bolus administration of intravenous contrast.  Contrast: OMNIPAQUE IOHEXOL 300 MG/ML  SOLN  Comparison: None available at time of study interpretation.  Findings: Limited view of the lung bases are clear.  The included heart and pericardium are unremarkable.  The liver, spleen, pancreas, and adrenal glands are normal in size, morphology and enhancement characteristics.  Status post cholecystectomy.  The stomach, small and large bowel are normal in course and caliber without definite wall thickening or inflammatory changes though sensitivity may be decreased as contrast has yet to reach the distal colon.  A few scattered sigmoid diverticula. Mild amount of retained large bowel stool.  Normal appendix.  No free fluid nor free air.  The kidneys are well-located, demonstrating normal morphology, size and enhancement without renal masses, nephrolithiasis or hydronephrosis.  Ureters are unremarkable.  Urinary bladder is partially distended and appears normal.  Great vessels are normal in course and caliber with mild calcific atherosclerosis.  No lymphadenopathy by CT size criteria.  Internal reproductive organs are surgically absent.  The included soft tissues and osseous structures are non-suspicious. Mild levoscoliosis could be positional.  IMPRESSION: Mild amount of retained large bowel stool, no bowel obstruction. No acute intra-abdominal pelvic process.  Status post cholecystectomy, hysterectomy.   Original Report Authenticated By: Rolin Barry, MD 11/24/12 [phone]

## 2012-11-24 NOTE — ED Notes (Signed)
Iv team continue to assess patient for possible iv access er md aware

## 2012-11-24 NOTE — ED Notes (Signed)
Er md aware patient thus far does not have iv access, patient received one mg hydromorphone im

## 2012-11-24 NOTE — ED Provider Notes (Addendum)
Medical screening examination/treatment/procedure(s) were conducted as a shared visit with resident physician and myself.  I personally evaluated the patient during the encounter. I approved and supervised the insertion of an angiocath under ultrasound guidance by the resident.   I interviewed and examined the patient. Lungs are CTAB. Cardiac exam wnl. Abdomen soft.  Pain c/w pt's chronic abd pain. Has not had CT abd here. Will get CT abd, labs.   Junius Argyle, MD 11/24/12 1144  Junius Argyle, MD 12/09/12 [phone]

## 2012-11-24 NOTE — ED Notes (Signed)
Patient seen by iv team

## 2012-11-25 ENCOUNTER — Inpatient Hospital Stay (HOSPITAL_COMMUNITY)
Admission: EM | Admit: 2012-11-25 | Discharge: 2012-11-28 | DRG: 642 | Disposition: A | Discharge status: Home / Self Care | Payer: Medicare Other | Attending: Internal Medicine | Admitting: Internal Medicine

## 2012-11-25 ENCOUNTER — Encounter (HOSPITAL_COMMUNITY): Payer: Self-pay | Admitting: Emergency Medicine

## 2012-11-25 DIAGNOSIS — E860 Dehydration: Secondary | ICD-10-CM | POA: Diagnosis present

## 2012-11-25 DIAGNOSIS — M069 Rheumatoid arthritis, unspecified: Secondary | ICD-10-CM | POA: Diagnosis present

## 2012-11-25 DIAGNOSIS — E039 Hypothyroidism, unspecified: Secondary | ICD-10-CM | POA: Diagnosis present

## 2012-11-25 DIAGNOSIS — K3184 Gastroparesis: Secondary | ICD-10-CM | POA: Diagnosis present

## 2012-11-25 DIAGNOSIS — R52 Pain, unspecified: Secondary | ICD-10-CM | POA: Diagnosis present

## 2012-11-25 DIAGNOSIS — R109 Unspecified abdominal pain: Secondary | ICD-10-CM | POA: Diagnosis present

## 2012-11-25 DIAGNOSIS — Z79899 Other long term (current) drug therapy: Secondary | ICD-10-CM

## 2012-11-25 DIAGNOSIS — K5900 Constipation, unspecified: Secondary | ICD-10-CM | POA: Diagnosis present

## 2012-11-25 DIAGNOSIS — IMO0001 Reserved for inherently not codable concepts without codable children: Secondary | ICD-10-CM | POA: Diagnosis present

## 2012-11-25 DIAGNOSIS — G8929 Other chronic pain: Secondary | ICD-10-CM | POA: Diagnosis present

## 2012-11-25 DIAGNOSIS — M329 Systemic lupus erythematosus, unspecified: Secondary | ICD-10-CM | POA: Diagnosis present

## 2012-11-25 DIAGNOSIS — Z96659 Presence of unspecified artificial knee joint: Secondary | ICD-10-CM

## 2012-11-25 LAB — CBC WITH DIFFERENTIAL/PLATELET
Basophils Absolute: 0 10*3/uL (ref 0.0–0.1)
Eosinophils Absolute: 0.1 10*3/uL (ref 0.0–0.7)
Eosinophils Relative: 1 % (ref 0–5)
HCT: 40 % (ref 36.0–46.0)
Hemoglobin: 14.2 g/dL (ref 12.0–15.0)
Lymphocytes Relative: 34 % (ref 12–46)
Lymphs Abs: 2 10*3/uL (ref 0.7–4.0)
MCHC: 35.5 g/dL (ref 30.0–36.0)
MCV: 88.7 fL (ref 78.0–100.0)
Monocytes Absolute: 0.4 10*3/uL (ref 0.1–1.0)
Monocytes Relative: 7 % (ref 3–12)
Platelets: 235 10*3/uL (ref 150–400)
RBC: 4.51 MIL/uL (ref 3.87–5.11)
WBC: 6 10*3/uL (ref 4.0–10.5)

## 2012-11-25 LAB — URINE CULTURE
Colony Count: 80000
Colony Count: NO GROWTH
Culture: NO GROWTH

## 2012-11-25 LAB — URINALYSIS, ROUTINE W REFLEX MICROSCOPIC
Glucose, UA: NEGATIVE mg/dL
Hgb urine dipstick: NEGATIVE
Ketones, ur: 15 mg/dL — AB
Leukocytes, UA: NEGATIVE
Protein, ur: NEGATIVE mg/dL
Urobilinogen, UA: 0.2 mg/dL (ref 0.0–1.0)

## 2012-11-25 LAB — COMPREHENSIVE METABOLIC PANEL
AST: 23 U/L (ref 0–37)
Albumin: 4.6 g/dL (ref 3.5–5.2)
Alkaline Phosphatase: 111 U/L (ref 39–117)
CO2: 23 mEq/L (ref 19–32)
Calcium: 9.8 mg/dL (ref 8.4–10.5)
Creatinine, Ser: 0.98 mg/dL (ref 0.50–1.10)
GFR calc Af Amer: 77 mL/min — ABNORMAL LOW (ref >90)
GFR calc non Af Amer: 67 mL/min — ABNORMAL LOW (ref >90)
Glucose, Bld: 84 mg/dL (ref 70–99)
Total Bilirubin: 0.6 mg/dL (ref 0.3–1.2)

## 2012-11-25 LAB — OCCULT BLOOD, POC DEVICE: Fecal Occult Bld: NEGATIVE

## 2012-11-25 MED ORDER — ONDANSETRON HCL 4 MG/2ML IJ SOLN
4.0000 mg | Freq: Once | INTRAMUSCULAR | Status: AC
  Administered 2012-11-25: 4 mg via INTRAVENOUS
  Filled 2012-11-25: qty 2

## 2012-11-25 MED ORDER — HYDROMORPHONE HCL PF 1 MG/ML IJ SOLN
1.0000 mg | Freq: Once | INTRAMUSCULAR | Status: AC
Start: 2012-11-25 — End: 2012-11-25
  Administered 2012-11-25: 1 mg via INTRAVENOUS
  Filled 2012-11-25: qty 1

## 2012-11-25 MED ORDER — ONDANSETRON 4 MG PO TBDP
8.0000 mg | ORAL_TABLET | Freq: Once | ORAL | Status: AC
  Administered 2012-11-25: 8 mg via ORAL
  Filled 2012-11-25: qty 2

## 2012-11-25 MED ORDER — SODIUM CHLORIDE 0.9 % IV BOLUS (SEPSIS)
1000.0000 mL | Freq: Once | INTRAVENOUS | Status: AC
  Administered 2012-11-25: 1000 mL via INTRAVENOUS

## 2012-11-25 MED ORDER — HYDROMORPHONE HCL PF 1 MG/ML IJ SOLN
1.0000 mg | Freq: Once | INTRAMUSCULAR | Status: AC
  Administered 2012-11-25: 1 mg via INTRAVENOUS
  Filled 2012-11-25: qty 1

## 2012-11-25 NOTE — Consult Note (Signed)
Heme/Onc Consult Note  Chief Complaint: Abdominal Pain/Porphyria   HPI:   Adrienne Flores is a 47 year old CF who presented to the ED with complaints of abdominal pain.  She states that her Sxs began about 7.5-8 years ago at which time she was Dx'ed with porphyria.  Since that time, she has had intermittent episodes of abdominal pain and nausea that have require visit to the ED.  She states that this episode began several days ago with diarrhea, nausea and abdominal pain.  Since that time, she has had multiple visits to the [facility] ED.  Her pain is located on the Rt. Side of her abdomen and described as sharp and stabbing in nature.  Over the last several days, she endorses poor PO intake and feels as though her urine has turned red.  She does not drink, smoke or use illegal drugs and denies any recent changes to her medication regimen prior to Sx onset.   ADDENDUM: patient tells me her first "attack" was 8 years ago; she was admitted to [facility] and evaluated by "a hematologist" there. 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 She has had multi[ple admissions there, at [facility], at [facility], and here. She is aware that a thorough workup for porphyria at [facility] was negative--"but they didn't protect the samples from light and did not keep them on ice like they should." The patient tells me she comes to our ED because she "does not trust" the MDs at [place]. She tells me she has been referred to [facility] for evaluation but "I never got the appointment."  PMH: 1. Rheumatoid Arthritis 2. Gastroparesis 3.  Degenerative Disc Disease 4.  Fibromyalgia 5.  Diverticulitis 6.  Thyroid Disease 7.  Lupus  MEDICATIONS: 1.  Synthroid 2.  Bentyl 3.  Gabapentin 4.  Oxycodone 5.  Xanax 6.  Zanaflex ALLERGIES: 1. MTX - Headaches  Past Surgical History  Procedure Laterality Date  . Cesarean section    . Replacement total knee Bilateral   . Abdominal hysterectomy    . Cholecystectomy       Allergies:   Allergies  Allergen Reactions  . 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 She is now disabled due to her RA, s/p B/ knee replacements. Her husband Adrienne Flores used to be "the man who painted the [employer] football field." He is now retired but has a side job in Office manager. The pt's son Adrienne Flores clears out and cleans foreclosed homes; daughter Adrienne Flores is a CNA. The patient has 4 grandchildren. She attends a [employer].  ROS: Pertinent items are noted in HPI.  ADDENDUM: the patient's normal functional status is limited to house work. When she gets one of her "attacks" she develops nausea, abdominal pain, curls up on the sofa, and if not better after a while her husband takes her to the ED. Currently she has nausea and "some pain" despite the narcotics she is receiving. She has a loose BM yesterda, normal in color. No vomiting currently.  PHYSICAL EXAM: Blood pressure 136/91, pulse 53, temperature 99 F (37.2 C), temperature source Oral, resp. rate 20, SpO2 100.00%.  BP 134/80  Pulse 67  Temp(Src) 99 F (37.2 C) (Oral)  Resp 20  SpO2 98% GEN: WDWN CM Moderate distress crying laying on strecher HEENT: ATNC Anicteric EOMI  PULM: CTAB, No W/R/R CV: RRR, NMRG, No LEE ABD: MTTP diffuse Rt. > Lt. No HSM Neuro: Alert and oriented, No focal deficits  IMAGING: CT A/P: Mild amount of retained large bowel stool, no bowel obstruction.  No acute intra-abdominal pelvic process.  Status post cholecystectomy, hysterectomy.  Assessment/Plan  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   A review of her medical records from [facility] in 10/2005 reveals that she was hospitalized for an acute episode at which time 24  hr urinary porphobilinogen and urinary ALA were WNL.  Her urine porphyrins were repeated in 12/2005 at which time her Uropophyrin, Heptacarboxylporphyrin, Hexacarboxylporpyrinn, and Coproporphyrin were elevated. Unfortunately, a urine PBG and ALA were not included in the records faxed from [facility] and thus a Dx of acute porphyria cannot be made based on these lab values.  In the absence of an elevated urine PBG would not rec. initiation of Hemin empirically. -Please obtain spot urine porphobilinogen, 24hr urine porphobilinogen, 24hr fractionated urine porphyrins and 24hr aminolevulinic acid to eval for acute porphyria. -Please avoid medicines deemed unsafe in pts. With acute porphyria -Mgmt of pain and nausea per discretion of admitting physician -Rec. initiating D5W gtt @ 75cc/hr; Will hold off on admin of hemin while awaiting results of random urine PBG  Appreciate consult and please call with questions. #[id number]  Adrienne Flores 11/25/2012, 10:22 PM   ADDENDUM: I concur with Dr Hulda Humphrey evaluation as addended. In brief:  This 47 y/o [place], [place] woman has a history of recurrent bouts of abdominal pain, nausea, and vomiting, leading to multiple ED visits and admissions in multiple health systems, with an unconfirmed history of acute porphyria.  We have ordered the appropriate tests to confirm or rule out acute porphyria in this case. Please make sure the samples are properly collected, refrigerated and kept from light (please review instructions with lab). Turnaround time may be several days, but the patient may be discharged at your discretion once all tests have been appropriately collected and sent--I will arrange for follow up in my office in a few weeks to go over results.  In the meantime, while patient is admitted, I would recommend a GI evaluation for a possible GI cause of her symptoms. I would also suggest a nutritional evaluation, as my feeling is we are dealing with a dietary  problem (perhaps gluten sensitivity, perhaps something else) and not porphyria.  I will follow with you peripherally. Please let me know if I can be of further help at this point.  I personally saw this patient and performed a substantive portion of this encounter with the Fellow as documented above.   Lowella Dell, MD

## 2012-11-25 NOTE — ED Notes (Signed)
Last dose phenergan 0900 this am Last dose oxycodone today 1200 Last dose bentyl 0900 this am

## 2012-11-25 NOTE — ED Notes (Signed)
Pt has a urine cup and knows we need a urine sample 

## 2012-11-25 NOTE — ED Notes (Addendum)
Pt presents to department for evaluation of abdominal pain and nausea/vomiting and diarrhea. Was seen x2 for same recently. Now states pain has increased, no relief with medications at home. 10/10 pain upon arrival to ED. She is alert and oriented x4. Unable to follow up with [facility], unable to get appointment set up.

## 2012-11-25 NOTE — ED Notes (Signed)
MD at bedside. Hematology

## 2012-11-25 NOTE — Progress Notes (Signed)
ED Antimicrobial Stewardship Positive Culture Follow Up   Adrienne Flores is an 47 y.o. female who presented to [facility] on 11/22/2012 with a chief complaint of  Chief Complaint  Patient presents with  . Abdominal Pain  . Nausea  . Emesis  . Diarrhea    Recent Results (from the past 720 hour(s))  URINE CULTURE     Status: None   Collection Time    11/10/12  5:19 PM      Result Value Range Status   Specimen Description URINE, CLEAN CATCH   Final   Special Requests NONE   Final   Culture  Setup Time     Final   Value: 11/10/2012 17:15     Performed at [facility] Count     Final   Value: 30,000 COLONIES/ML     Performed at [facility]   Culture     Final   Value: Multiple bacterial morphotypes present, none predominant. Suggest appropriate recollection if clinically indicated.     Performed at [facility]   Report Status 11/11/2012 FINAL   Final  URINE CULTURE     Status: None   Collection Time    11/22/12  2:11 PM      Result Value Range Status   Specimen Description URINE, RANDOM   Final   Special Requests NONE ADD [id number]   Final   Culture  Setup Time     Final   Value: 11/22/2012 22:00     Performed at [facility] Count     Final   Value: 80,000 COLONIES/ML     Performed at [facility]   Culture     Final   Value: ENTEROCOCCUS SPECIES     Performed at [facility]   Report Status 11/25/2012 FINAL   Final   Organism ID, Bacteria ENTEROCOCCUS SPECIES   Final  URINE CULTURE     Status: None   Collection Time    11/24/12  2:24 AM      Result Value Range Status   Specimen Description URINE, CLEAN CATCH   Final   Special Requests ADDED [id number]   Final   Culture  Setup Time     Final   Value: 11/24/2012 03:41     Performed at [facility] Count     Final   Value: NO GROWTH     Performed at [facility]   Culture     Final   Value: NO GROWTH     Performed at  [facility]   Report Status 11/25/2012 FINAL   Final    Recommendation: No urinary/vaginal complaints reported. Enterococcus was reported on 10/11 culture (only 80K colonies) but not reported on 10/12 culture. Most likely a contaminant and/or asymptomatic colonization/bacteruria - no treatment indicated at this time.  ED Provider: Ebbie Ridge, PA-C   Cleon Dew 11/25/2012, 4:15 PM Infectious Diseases Pharmacist Phone# [phone]

## 2012-11-25 NOTE — ED Provider Notes (Signed)
CSN: [id number]     Arrival date & time 11/25/12  1629 History   First MD Initiated Contact with Patient 11/25/12 1757     Chief Complaint  Patient presents with  . Abdominal Pain  . Nausea   (Consider location/radiation/quality/duration/timing/severity/associated sxs/prior Treatment) HPI Comments: Patient is a 47 year female with history of rheumatoid arthritis, gastroparesis, degenerative disc disease, fibromyalgia, diverticulitis, thyroid disease, lupus who presents today with abdominal pain, nausea, vomiting, diarrhea. Been ongoing since September 28. She has been seen in the emergency department 3 prior times for this issue. Her last evaluation was on 10/13. At this time she received a CT scan of her abdomen which was normal. She reports that 5 hours ago her pain worsened. Her pain is a sharp, stabbing pain worse on the right side of her abdomen and in her epigastric area. She also notes that now she feels as though her diarrhea has a more red color. She states "I know I'm getting more dehydrated and the only thing that helps are IV Fluids, Dilaudid, Phenergan. I don't want to let it get too bad".   The history is provided by the patient. No language interpreter was used.    Past Medical History  Diagnosis Date  . Rheumatoid arthritis   . Gastroparesis   . Degenerative disc disease   . Fibromyalgia   . Diverticulitis   . Thyroid disease   . Lupus    Past Surgical History  Procedure Laterality Date  . Cesarean section    . Replacement total knee Bilateral   . Abdominal hysterectomy    . Cholecystectomy     No family history on file. History  Substance Use Topics  . Smoking status: Never Smoker   . Smokeless tobacco: Not on file  . Alcohol Use: No   OB History   Grav Para Term Preterm Abortions TAB SAB Ect Mult Living                 Review of Systems  Constitutional: Negative for fever and chills.  Respiratory: Negative for shortness of breath.   Gastrointestinal:  Positive for nausea, abdominal pain and diarrhea. Negative for vomiting.  All other systems reviewed and are negative.    Allergies  Methotrexate derivatives  Home Medications   Current Outpatient Rx  Name  Route  Sig  Dispense  Refill  . ALPRAZolam (XANAX) 0.25 MG tablet   Oral   Take 0.25 mg by mouth 3 (three) times daily as needed for anxiety.         . dicyclomine (BENTYL) 20 MG tablet   Oral   Take 1 tablet (20 mg total) by mouth every 6 (six) hours as needed (for abdominal cramping).   20 tablet   0   . gabapentin (NEURONTIN) 600 MG tablet   Oral   Take 600 mg by mouth 3 (three) times daily.         [name] levothyroxine (SYNTHROID, LEVOTHROID) 200 MCG tablet   Oral   Take 200 mcg by mouth daily before breakfast.         . Multiple Vitamin (MULTIVITAMIN WITH MINERALS) TABS tablet   Oral   Take 1 tablet by mouth daily.         . naproxen sodium (ANAPROX) 220 MG tablet   Oral   Take 220 mg by mouth daily as needed (for arthritis).         . ondansetron (ZOFRAN ODT) 8 MG disintegrating tablet   Oral  Take 1 tablet (8 mg total) by mouth every 8 (eight) hours as needed for nausea.   20 tablet   0   . oxycodone (OXY-IR) 5 MG capsule   Oral   Take 5 mg by mouth every 4 (four) hours as needed for pain.          . polyethylene glycol (MIRALAX / GLYCOLAX) packet   Oral   Take 17 g by mouth daily.   30 each   0   . promethazine (PHENERGAN) 25 MG tablet   Oral   Take 1 tablet (25 mg total) by mouth every 6 (six) hours as needed for nausea.   20 tablet   0   . tizanidine (ZANAFLEX) 2 MG capsule   Oral   Take 4 mg by mouth 3 (three) times daily as needed.           BP 158/106  Pulse 67  Temp(Src) 98.8 F (37.1 C) (Oral)  SpO2 100% Physical Exam  Nursing note and vitals reviewed. Constitutional: She is oriented to person, place, and time. She appears well-developed and well-nourished. She appears distressed.  HENT:  Head: Normocephalic and  atraumatic.  Right Ear: External ear normal.  Left Ear: External ear normal.  Nose: Nose normal.  Mouth/Throat: Oropharynx is clear and moist.  Eyes: Conjunctivae are normal.  Neck: Normal range of motion.  Cardiovascular: Normal rate, regular rhythm and normal heart sounds.   Pulmonary/Chest: Effort normal and breath sounds normal. No stridor. No respiratory distress. She has no wheezes. She has no rales.  Abdominal: Soft. She exhibits no distension. There is tenderness in the right upper quadrant, right lower quadrant, epigastric area and periumbilical area. There is no rigidity, no rebound and no guarding.  Musculoskeletal: Normal range of motion.  Neurological: She is alert and oriented to person, place, and time. She has normal strength.  Skin: Skin is warm and dry. She is not diaphoretic. No erythema.  Psychiatric: She has a normal mood and affect. Her behavior is normal.    ED Course  Procedures (including critical care time) Labs Review Labs Reviewed  COMPREHENSIVE METABOLIC PANEL - Abnormal; Notable for the following:    Potassium 3.4 (*)    Total Protein 8.7 (*)    GFR calc non Af Amer 67 (*)    GFR calc Af Amer 77 (*)    All other components within normal limits  URINALYSIS, ROUTINE W REFLEX MICROSCOPIC - Abnormal; Notable for the following:    Ketones, ur 15 (*)    All other components within normal limits  COMPREHENSIVE METABOLIC PANEL - Abnormal; Notable for the following:    Potassium 3.4 (*)    Creatinine, Ser 1.12 (*)    GFR calc non Af Amer 57 (*)    GFR calc Af Amer 66 (*)    All other components within normal limits  CBC WITH DIFFERENTIAL - Abnormal; Notable for the following:    Lymphocytes Relative 47 (*)    All other components within normal limits  TSH - Abnormal; Notable for the following:    TSH 81.505 (*)    All other components within normal limits  CLOSTRIDIUM DIFFICILE BY PCR  STOOL CULTURE  CBC WITH DIFFERENTIAL  LIPASE, BLOOD  LACTIC ACID,  PLASMA  SEDIMENTATION RATE  PORPHOBILINOGEN, 24 HR URINE-QUANT  AMINOLEVULINIC ACID, 24 HOUR  PORPHOBILINOGEN, RANDOM URINE  PORPHYRINS, FRACTIONATED URINE (TIMED COLLECTION)  PORPHYRINS, FRACTIONATED, RANDOM URINE  C3 COMPLEMENT  C4 COMPLEMENT  OCCULT BLOOD, POC DEVICE  Imaging Review Ct Abdomen Pelvis W Contrast  11/24/2012   *RADIOLOGY REPORT*  Clinical Data: Abdominal pain.  CT ABDOMEN AND PELVIS WITH CONTRAST  Technique:  Multidetector CT imaging of the abdomen and pelvis was performed following the standard protocol during bolus administration of intravenous contrast.  Contrast: OMNIPAQUE IOHEXOL 300 MG/ML  SOLN  Comparison: None available at time of study interpretation.  Findings: Limited view of the lung bases are clear.  The included heart and pericardium are unremarkable.  The liver, spleen, pancreas, and adrenal glands are normal in size, morphology and enhancement characteristics.  Status post cholecystectomy.  The stomach, small and large bowel are normal in course and caliber without definite wall thickening or inflammatory changes though sensitivity may be decreased as contrast has yet to reach the distal colon.  A few scattered sigmoid diverticula. Mild amount of retained large bowel stool.  Normal appendix.  No free fluid nor free air.  The kidneys are well-located, demonstrating normal morphology, size and enhancement without renal masses, nephrolithiasis or hydronephrosis.  Ureters are unremarkable.  Urinary bladder is partially distended and appears normal.  Great vessels are normal in course and caliber with mild calcific atherosclerosis.  No lymphadenopathy by CT size criteria. Internal reproductive organs are surgically absent.  The included soft tissues and osseous structures are non-suspicious. Mild levoscoliosis could be positional.  IMPRESSION: Mild amount of retained large bowel stool, no bowel obstruction. No acute intra-abdominal pelvic process.  Status post  cholecystectomy, hysterectomy.   Original Report Authenticated By: Awilda Metro   Attempted to discharge patient due to normal labs and CT done yesterday. 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 She was dx'd at [facility] 7.5 years ago. No treatment since then. Plan will be to consult with hospitalist as they will likely be more familiar with the condition.   10:08 PM Discussed case with Dr. Toniann Fail who consulted GI. He recommends consult to hematology for further evaluation. Will consult now.   10:18 PM Discussed case with Dr. Maryclare Labrador from hematology who will come see patient. Now will attempt to get lab results from [facility].   EKG Interpretation     Ventricular Rate:    PR Interval:    QRS Duration:   QT Interval:    QTC Calculation:   R Axis:     Text Interpretation:              MDM   1. Abdominal pain   2. 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 Recommend admission for IV antiemetic by hematologist. They will follow patient. Pain is not controlled with 3 mg of dilaudid and 8 mg of zofran. Vital signs are stable.    Mora Bellman, PA-C 11/26/12 1541  Medical screening examination/treatment/procedure(s) were conducted as a shared visit with non-physician practitioner(s) or resident and myself. I personally evaluated the patient during the encounter and agree with the findings and plan unless otherwise indicated.  Recurrent abd pain for 6 yrs. Similar to previous. GB removed in the past. Pain right upper and right mid. No fevers. NO recent vomiting. Intermittent. On narcotics at home. Exam mild RUQ and right flank pain, obese, soft, no guarding, mild dry mm. Difficult IV, [place] used to place right sided. Pt pain worsening in ED. 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 Discussed with medicine who recommended hematology who evaluated and  recommended observation  for pain/ nausea. Pain meds repeated in ED.  Emergency Ultrasound Study:  Angiocath insertion Performed by: Enid Skeens  Consent: Verbal consent obtained. Risks and benefits: risks, benefits and alternatives were discussed Immediately prior to procedure the correct patient, procedure, equipment, support staff and site/side marked as needed.  Indication: difficult IV access  Preparation: Patient was prepped and draped in the usual sterile fashion.  Vein Location: right ac vein was visualized during assessment for potential access sites and was found to be patent/ easily compressed with linear ultrasound. The needle was visualized with real-time ultrasound and guided into the vein.  Gauge: 20 g  Image saved and stored.  Normal blood return. Patient tolerance: Patient tolerated the procedure well with no immediate complications.     Enid Skeens, MD 11/28/12 [phone]

## 2012-11-26 ENCOUNTER — Other Ambulatory Visit: Payer: Self-pay | Admitting: Oncology

## 2012-11-26 ENCOUNTER — Encounter (HOSPITAL_COMMUNITY): Payer: Self-pay | Admitting: *Deleted

## 2012-11-26 DIAGNOSIS — E039 Hypothyroidism, unspecified: Secondary | ICD-10-CM

## 2012-11-26 DIAGNOSIS — M069 Rheumatoid arthritis, unspecified: Secondary | ICD-10-CM

## 2012-11-26 DIAGNOSIS — R197 Diarrhea, unspecified: Secondary | ICD-10-CM

## 2012-11-26 DIAGNOSIS — R110 Nausea: Secondary | ICD-10-CM

## 2012-11-26 DIAGNOSIS — M329 Systemic lupus erythematosus, unspecified: Secondary | ICD-10-CM

## 2012-11-26 DIAGNOSIS — K3184 Gastroparesis: Secondary | ICD-10-CM | POA: Diagnosis present

## 2012-11-26 DIAGNOSIS — R109 Unspecified abdominal pain: Secondary | ICD-10-CM

## 2012-11-26 LAB — CBC WITH DIFFERENTIAL/PLATELET
Basophils Absolute: 0 10*3/uL (ref 0.0–0.1)
Basophils Relative: 0 % (ref 0–1)
Eosinophils Absolute: 0.1 10*3/uL (ref 0.0–0.7)
HCT: 37.8 % (ref 36.0–46.0)
Lymphocytes Relative: 47 % — ABNORMAL HIGH (ref 12–46)
MCH: 31.1 pg (ref 26.0–34.0)
MCHC: 34.7 g/dL (ref 30.0–36.0)
Monocytes Absolute: 0.4 10*3/uL (ref 0.1–1.0)
Neutro Abs: 2.3 10*3/uL (ref 1.7–7.7)
Platelets: 219 10*3/uL (ref 150–400)
RDW: 14.4 % (ref 11.5–15.5)
WBC: 5.4 10*3/uL (ref 4.0–10.5)

## 2012-11-26 LAB — COMPREHENSIVE METABOLIC PANEL
ALT: 14 U/L (ref 0–35)
AST: 20 U/L (ref 0–37)
Alkaline Phosphatase: 94 U/L (ref 39–117)
BUN: 9 mg/dL (ref 6–23)
CO2: 22 mEq/L (ref 19–32)
Calcium: 9.1 mg/dL (ref 8.4–10.5)
Chloride: 103 mEq/L (ref 96–112)
GFR calc Af Amer: 66 mL/min — ABNORMAL LOW (ref >90)
GFR calc non Af Amer: 57 mL/min — ABNORMAL LOW (ref >90)
Glucose, Bld: 92 mg/dL (ref 70–99)
Potassium: 3.4 mEq/L — ABNORMAL LOW (ref 3.5–5.1)
Sodium: 138 mEq/L (ref 135–145)
Total Bilirubin: 0.5 mg/dL (ref 0.3–1.2)

## 2012-11-26 LAB — LACTIC ACID, PLASMA: Lactic Acid, Venous: 1.1 mmol/L (ref 0.5–2.2)

## 2012-11-26 LAB — CLOSTRIDIUM DIFFICILE BY PCR: Toxigenic C. Difficile by PCR: NEGATIVE

## 2012-11-26 MED ORDER — MORPHINE SULFATE 2 MG/ML IJ SOLN
2.0000 mg | INTRAMUSCULAR | Status: DC | PRN
  Administered 2012-11-26 (×2): 2 mg via INTRAVENOUS
  Filled 2012-11-26 (×2): qty 1

## 2012-11-26 MED ORDER — DEXTROSE 5 % IV SOLN
INTRAVENOUS | Status: AC
  Administered 2012-11-26: 04:00:00 via INTRAVENOUS

## 2012-11-26 MED ORDER — OXYCODONE HCL 5 MG PO TABS
5.0000 mg | ORAL_TABLET | ORAL | Status: DC | PRN
  Administered 2012-11-26 (×3): 5 mg via ORAL
  Filled 2012-11-26 (×3): qty 1

## 2012-11-26 MED ORDER — GABAPENTIN 600 MG PO TABS
600.0000 mg | ORAL_TABLET | Freq: Three times a day (TID) | ORAL | Status: DC
  Administered 2012-11-26 – 2012-11-28 (×8): 600 mg via ORAL
  Filled 2012-11-26 (×10): qty 1

## 2012-11-26 MED ORDER — LEVOTHYROXINE SODIUM 200 MCG PO TABS
200.0000 ug | ORAL_TABLET | Freq: Every day | ORAL | Status: DC
  Administered 2012-11-26 – 2012-11-27 (×2): 200 ug via ORAL
  Filled 2012-11-26 (×3): qty 1

## 2012-11-26 MED ORDER — BOOST / RESOURCE BREEZE PO LIQD
1.0000 | ORAL | Status: DC
  Administered 2012-11-26 – 2012-11-27 (×2): 1 via ORAL

## 2012-11-26 MED ORDER — POLYETHYLENE GLYCOL 3350 17 G PO PACK
17.0000 g | PACK | Freq: Every day | ORAL | Status: DC
  Administered 2012-11-26 – 2012-11-27 (×2): 17 g via ORAL
  Filled 2012-11-26 (×3): qty 1

## 2012-11-26 MED ORDER — ACETAMINOPHEN 650 MG RE SUPP: 650.0000 mg | Freq: Four times a day (QID) | RECTAL | Status: DC | PRN

## 2012-11-26 MED ORDER — TIZANIDINE HCL 4 MG PO TABS
4.0000 mg | ORAL_TABLET | Freq: Three times a day (TID) | ORAL | Status: DC | PRN
Start: 2012-11-26 — End: 2012-11-28
  Administered 2012-11-26 – 2012-11-27 (×2): 4 mg via ORAL
  Filled 2012-11-26 (×3): qty 1

## 2012-11-26 MED ORDER — DIPHENHYDRAMINE HCL 50 MG/ML IJ SOLN
12.5000 mg | Freq: Four times a day (QID) | INTRAMUSCULAR | Status: DC | PRN
  Administered 2012-11-26: 12:00:00 12.5 mg via INTRAVENOUS
  Filled 2012-11-26: qty 1

## 2012-11-26 MED ORDER — ACETAMINOPHEN 325 MG PO TABS: 650.0000 mg | ORAL_TABLET | Freq: Four times a day (QID) | ORAL | Status: DC | PRN

## 2012-11-26 MED ORDER — MORPHINE SULFATE 2 MG/ML IJ SOLN
1.0000 mg | INTRAMUSCULAR | Status: DC | PRN
  Administered 2012-11-26 (×2): 1 mg via INTRAVENOUS
  Filled 2012-11-26 (×4): qty 1

## 2012-11-26 MED ORDER — POTASSIUM CHLORIDE CRYS ER 20 MEQ PO TBCR
60.0000 meq | EXTENDED_RELEASE_TABLET | Freq: Once | ORAL | Status: AC
  Administered 2012-11-26: 12:00:00 60 meq via ORAL
  Filled 2012-11-26: qty 3

## 2012-11-26 MED ORDER — HYDROMORPHONE HCL PF 1 MG/ML IJ SOLN
1.0000 mg | INTRAMUSCULAR | Status: DC | PRN
  Administered 2012-11-26 – 2012-11-28 (×10): 1 mg via INTRAVENOUS
  Filled 2012-11-26 (×10): qty 1

## 2012-11-26 MED ORDER — KCL IN DEXTROSE-NACL 20-5-0.9 MEQ/L-%-% IV SOLN
INTRAVENOUS | Status: DC
  Administered 2012-11-26: 03:00:00 via INTRAVENOUS
  Filled 2012-11-26 (×2): qty 1000

## 2012-11-26 MED ORDER — ALPRAZOLAM 0.25 MG PO TABS
0.2500 mg | ORAL_TABLET | Freq: Three times a day (TID) | ORAL | Status: DC | PRN
  Administered 2012-11-26: 0.25 mg via ORAL
  Filled 2012-11-26: qty 1

## 2012-11-26 MED ORDER — ONDANSETRON HCL 4 MG PO TABS
4.0000 mg | ORAL_TABLET | Freq: Four times a day (QID) | ORAL | Status: DC | PRN
  Administered 2012-11-26 – 2012-11-28 (×2): 4 mg via ORAL
  Filled 2012-11-26 (×2): qty 1

## 2012-11-26 MED ORDER — ONDANSETRON HCL 4 MG/2ML IJ SOLN
4.0000 mg | Freq: Four times a day (QID) | INTRAMUSCULAR | Status: DC | PRN
  Administered 2012-11-26: 06:00:00 4 mg via INTRAVENOUS
  Filled 2012-11-26: qty 2

## 2012-11-26 MED ORDER — MAGNESIUM HYDROXIDE 400 MG/5ML PO SUSP
30.0000 mL | Freq: Once | ORAL | Status: AC
  Administered 2012-11-26: 30 mL via ORAL
  Filled 2012-11-26 (×2): qty 30

## 2012-11-26 NOTE — Progress Notes (Signed)
INITIAL NUTRITION ASSESSMENT  DOCUMENTATION CODES Per approved criteria  -Obesity Unspecified   INTERVENTION: Add Resource Breeze po daily, each supplement provides 250 kcal and 9 grams of protein. Further diet advancement per team's discretion. Monitor magnesium, potassium, and phosphorus daily for at least 3 days, MD to replete as needed, as Adrienne Flores is at risk for refeeding syndrome given Adrienne Flores's report of very poor oral intake. RD to continue to follow nutrition care plan.  NUTRITION DIAGNOSIS: Inadequate oral intake related to poor appetite and chronic GI distress as evidenced by Adrienne Flores report.   Goal: Diet advancement as tolerated; Intake to meet >90% of estimated nutrition needs.  Monitor:  weight trends, lab trends, I/O's, PO intake, supplement tolerance, further diet advancement  Reason for Assessment: Malnutrition Screening Tool  47 y.o. female  Admitting Dx: Abdominal pain  ASSESSMENT: PMHx significant for fibromyalgia, hypothyroidism, rheumatoid arthritis/lupus. Admitted with abdominal pain, n/v/d, mild stool burden. 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  Discussed patient during progression rounds. Adrienne Flores has severe abdominal pain, she has had multiple ED visits to various hospitals for the same issue. She stated that 7 years ago she was hospitalized initially after this started, and even required TPN for 3-4 days. She notes that when her stomach pain gets really bad, she is unable to eat anything, goes multiple days without eating. Her weight, however, has remained fairly stable. She states that her weight doesn't fluctuate a lot because her thyroid is out of control.  Adrienne Flores was very tearful during entire encounter, this RD asked Adrienne Flores if she would like a chaplain to visit her, she declined.  Adrienne Flores with clear liquid order. Refused all of her breakfast, stated that she didn't want to eat. Has tried Ensure in the past, states that she can't tolerate it. Amenable to trying juice.  Adrienne Flores is at  nutrition risk given her very poor oral intake and is at risk for refeeding syndrome if/when she begins to eat. Current potassium is low, no magnesium or phosphorus is available.  Height: Ht Readings from Last 1 Encounters:  11/26/12 5\' 4"  (1.626 m)    Weight: Wt Readings from Last 1 Encounters:  11/26/12 188 lb 11.4 oz (85.6 kg)    Ideal Body Weight: 120 lb  % Ideal Body Weight: 64%  Wt Readings from Last 10 Encounters:  11/26/12 188 lb 11.4 oz (85.6 kg)  11/23/12 189 lb 6.4 oz (85.911 kg)  11/22/12 182 lb (82.555 kg)  11/10/12 192 lb (87.091 kg)    Usual Body Weight: 190 lb  % Usual Body Weight: 99%  BMI:  Body mass index is 32.38 kg/(m^2). Obese Class I  Estimated Nutritional Needs: Kcal: 1500 - 1750 Protein: 80 - 90 g Fluid: 1.5 - 1.8 liters  Skin: intact  Diet Order: Clear Liquid  EDUCATION NEEDS: -No education needs identified at this time   Intake/Output Summary (Last 24 hours) at 11/26/12 0958 Last data filed at 11/26/12 0600  Gross per 24 hour  Intake    150 ml  Output      0 ml  Net    150 ml    Last BM: 10/14  Labs:   Recent Labs Lab 11/24/12 0230 11/25/12 1803 11/26/12 0551  NA 135 142 138  K 3.6 3.4* 3.4*  CL 102 103 103  CO2 19 23 22   BUN 15 9 9   CREATININE 0.95 0.98 1.12*  CALCIUM 8.6 9.8 9.1  GLUCOSE 98 84 92    CBG (last 3)  No results found for this  basename: GLUCAP,  in the last 72 hours  Scheduled Meds: . gabapentin  600 mg Oral TID  . levothyroxine  200 mcg Oral QAC breakfast  . polyethylene glycol  17 g Oral Daily    Continuous Infusions: . dextrose 75 mL/hr at 11/26/12 0400    Past Medical History  Diagnosis Date  . Rheumatoid arthritis   . Gastroparesis   . Degenerative disc disease   . Fibromyalgia   . Diverticulitis   . Thyroid disease   . Lupus     Past Surgical History  Procedure Laterality Date  . Cesarean section    . Replacement total knee Bilateral   . Abdominal hysterectomy    .  Cholecystectomy      Jarold Motto MS, RD, LDN Pager: [phone] After-hours pager: [phone]

## 2012-11-26 NOTE — H&P (Signed)
[facility] History and Physical  Adrienne Flores ZOX:[id number] DOB: August 10, 1965 DOA: 11/25/2012  Referring physician: ER physician. PCP: Rolm Gala, MD   Chief Complaint: Abdominal pain and nausea and vomiting and diarrhea.  HPI: Adrienne Flores is a 47 y.o. female with history of fibromyalgia, hypothyroidism, rheumatoid arthritis/lupus presently on no medications presented to the ER because of persistent abdominal pain for last 3 days. The pain is mostly on the right upper and lower quadrants stabbing in nature with nausea vomiting and diarrhea. CT abdomen pelvis done in the ER only showed mild stool burden. 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 Hematologist on call was consulted and at this time further tests confirmed acute porphyria has been ordered and patient was admitted for further management. Patient denies any fever chills chest pain shortness of breath. Denies any upper or lower extremity weakness numbness.   Review of Systems: As presented in the history of presenting illness, rest negative.  Past Medical History  Diagnosis Date  . Rheumatoid arthritis   . Gastroparesis   . Degenerative disc disease   . Fibromyalgia   . Diverticulitis   . Thyroid disease   . Lupus    Past Surgical History  Procedure Laterality Date  . Cesarean section    . Replacement total knee Bilateral   . Abdominal hysterectomy    . Cholecystectomy     Social History:  reports that she has never smoked. She does not have any smokeless tobacco history on file. She reports that she drinks alcohol. She reports that she does not use illicit drugs. Where does patient live home. Can patient participate in ADLs? Yes.  Allergies  Allergen Reactions  . Methotrexate Derivatives Other (See Comments)    headaches    Family History:  Family History  Problem Relation Age of Onset  . Leukemia Mother       Prior to Admission medications   Medication Sig Start Date End Date  Taking? Authorizing Provider  ALPRAZolam (XANAX) 0.25 MG tablet Take 0.25 mg by mouth 3 (three) times daily as needed for anxiety.   Yes Historical Provider, MD  dicyclomine (BENTYL) 20 MG tablet Take 1 tablet (20 mg total) by mouth every 6 (six) hours as needed (for abdominal cramping). 11/24/12  Yes Olivia Mackie, MD  gabapentin (NEURONTIN) 600 MG tablet Take 600 mg by mouth 3 (three) times daily.   Yes Historical Provider, MD  levothyroxine (SYNTHROID, LEVOTHROID) 200 MCG tablet Take 200 mcg by mouth daily before breakfast.   Yes Historical Provider, MD  Multiple Vitamin (MULTIVITAMIN WITH MINERALS) TABS tablet Take 1 tablet by mouth daily.   Yes Historical Provider, MD  naproxen sodium (ANAPROX) 220 MG tablet Take 220 mg by mouth daily as needed (for arthritis).   Yes Historical Provider, MD  ondansetron (ZOFRAN ODT) 8 MG disintegrating tablet Take 1 tablet (8 mg total) by mouth every 8 (eight) hours as needed for nausea. 11/24/12  Yes Olivia Mackie, MD  oxycodone (OXY-IR) 5 MG capsule Take 5 mg by mouth every 4 (four) hours as needed for pain.  10/09/12  Yes Historical Provider, MD  polyethylene glycol (MIRALAX / GLYCOLAX) packet Take 17 g by mouth daily. 11/24/12  Yes Olivia Mackie, MD  promethazine (PHENERGAN) 25 MG tablet Take 1 tablet (25 mg total) by mouth every 6 (six) hours as needed for nausea. 11/22/12  Yes Antony Madura, PA-C  tizanidine (ZANAFLEX) 2 MG capsule Take 4 mg by mouth 3 (three) times daily as  needed.  09/29/12  Yes Historical Provider, MD    Physical Exam: Filed Vitals:   11/25/12 2200 11/25/12 2300 11/25/12 2330 11/26/12 0040  BP: 130/73 161/84 134/80 133/92  Pulse: 56 60 67 62  Temp:    98.3 F (36.8 C)  TempSrc:    Oral  Resp:    20  Height:    5\' 4"  (1.626 m)  Weight:    85.6 kg (188 lb 11.4 oz)  SpO2: 100% 100% 98% 98%     General:  Well-developed and nourished.  Eyes: Anicteric no pallor.  ENT: No discharge from ears eyes nose mouth.  Neck: No mass  felt.  Cardiovascular: S1-S2 heard.  Respiratory: No rhonchi or crepitations.  Abdomen: Soft mild tenderness in the right upper and lower quadrant.  Skin: No rash.  Musculoskeletal: No edema.  Psychiatric: Appears normal.  Neurologic: Alert awake and oriented to time place and person. Moves all extremities.  Labs on Admission:  Basic Metabolic Panel:  Recent Labs Lab 11/22/12 1230 11/24/12 0230 11/25/12 1803  NA 135 135 142  K 4.5 3.6 3.4*  CL 99 102 103  CO2 21 19 23   GLUCOSE 93 98 84  BUN 12 15 9   CREATININE 0.83 0.95 0.98  CALCIUM 10.0 8.6 9.8   Liver Function Tests:  Recent Labs Lab 11/22/12 1230 11/24/12 0230 11/25/12 1803  AST 23 19 23   ALT 21 15 16   ALKPHOS 124* 96 111  BILITOT 0.4 0.6 0.6  PROT 9.1* 7.5 8.7*  ALBUMIN 4.5 3.9 4.6    Recent Labs Lab 11/22/12 1230 11/24/12 0230 11/25/12 1803  LIPASE 13 17 28    No results found for this basename: AMMONIA,  in the last 168 hours CBC:  Recent Labs Lab 11/22/12 1230 11/24/12 0230 11/25/12 1803  WBC 7.0 6.7 6.0  NEUTROABS 5.1 4.2 3.5  HGB 15.0 12.4 14.2  HCT 42.4 36.8 40.0  MCV 89.6 91.1 88.7  PLT 262 232 235   Cardiac Enzymes: No results found for this basename: CKTOTAL, CKMB, CKMBINDEX, TROPONINI,  in the last 168 hours  BNP (last 3 results) No results found for this basename: PROBNP,  in the last 8760 hours CBG: No results found for this basename: GLUCAP,  in the last 168 hours  Radiological Exams on Admission: Ct Abdomen Pelvis W Contrast  11/24/2012   *RADIOLOGY REPORT*  Clinical Data: Abdominal pain.  CT ABDOMEN AND PELVIS WITH CONTRAST  Technique:  Multidetector CT imaging of the abdomen and pelvis was performed following the standard protocol during bolus administration of intravenous contrast.  Contrast: OMNIPAQUE IOHEXOL 300 MG/ML  SOLN  Comparison: None available at time of study interpretation.  Findings: Limited view of the lung bases are clear.  The included heart  and pericardium are unremarkable.  The liver, spleen, pancreas, and adrenal glands are normal in size, morphology and enhancement characteristics.  Status post cholecystectomy.  The stomach, small and large bowel are normal in course and caliber without definite wall thickening or inflammatory changes though sensitivity may be decreased as contrast has yet to reach the distal colon.  A few scattered sigmoid diverticula. Mild amount of retained large bowel stool.  Normal appendix.  No free fluid nor free air.  The kidneys are well-located, demonstrating normal morphology, size and enhancement without renal masses, nephrolithiasis or hydronephrosis.  Ureters are unremarkable.  Urinary bladder is partially distended and appears normal.  Great vessels are normal in course and caliber with mild calcific atherosclerosis.  No lymphadenopathy  by CT size criteria. Internal reproductive organs are surgically absent.  The included soft tissues and osseous structures are non-suspicious. Mild levoscoliosis could be positional.  IMPRESSION: Mild amount of retained large bowel stool, no bowel obstruction. No acute intra-abdominal pelvic process.  Status post cholecystectomy, hysterectomy.   Original Report Authenticated By: Awilda Metro     Assessment/Plan Principal Problem:   Abdominal pain Active Problems:   Unspecified hypothyroidism   1. Abdominal pain - cause not clear. 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 At this time we will try to avoid medications which could worsen porphyria. Gently hydrate with D5W closely follow metabolic panel for hyponatremia. Check stool studies and cultures C. difficile. Check lactic acid levels. 2. Hypothyroidism - continue Synthroid. Check TSH. 3. History of rheumatoid arthritis and lupus - presently on no medications. Check C3-C4 levels and sedimentation  rate. 4. History of fibromyalgia - continue present medications.    Code Status: Full code.  Family Communication: Patient's husband at the bedside.  Disposition Plan: Admit to inpatient.    Alyshia Kernan N. [facility] Pager [phone].  If 7PM-7AM, please contact night-coverage [email or web address] Password [email or web address] 11/26/2012, 1:20 AM

## 2012-11-26 NOTE — Progress Notes (Signed)
[facility] PROGRESS NOTE  Adrienne Flores WUJ:[id number] DOB: 04-11-1965 DOA: 11/25/2012 PCP: Rolm Gala, MD  HPI/Subjective: Reported 9/10 abdominal pain mainly in the right side.  Assessment/Plan: Principal Problem:   Abdominal pain Active Problems:   Unspecified hypothyroidism    Abdominal pain -Acute on chronic abdominal pain, patient has multiple visits to different health systems with he mentioned complaints. -Unconfirmed diagnosis of acute intermittent porphyria, hematology/oncology consulted. -Porphyria workup. -Symptomatic management, carbohydrate load with D5W infusion. -Continue pain control and nausea management. On clear liquids.  Hypothyroidism -Check TSH, continue her high dose of levothyroxine.  Chronic pain/chronic narcotic use -Patient is on oxycodone at home for back problems. -Patient reported no relief with morphine, increasing morphine doses 2 mg. Patient might need Dilaudid for better pain control.  Constipation -CT scan showed some retained stool, start laxatives.  Code Status: Full code Family Communication: Plan discussed with the patient. Disposition Plan: Remains inpatient   Consultants:  Hematology  Procedures:  None  Antibiotics:  None   Objective: Filed Vitals:   11/26/12 0521  BP: 119/75  Pulse: 71  Temp: 98.5 F (36.9 C)  Resp: 18    Intake/Output Summary (Last 24 hours) at 11/26/12 1048 Last data filed at 11/26/12 0600  Gross per 24 hour  Intake    150 ml  Output      0 ml  Net    150 ml   Filed Weights   11/26/12 0040  Weight: 85.6 kg (188 lb 11.4 oz)    Exam: General: Alert and awake, oriented x3, not in any acute distress. HEENT: anicteric sclera, pupils reactive to light and accommodation, EOMI CVS: S1-S2 clear, no murmur rubs or gallops Chest: clear to auscultation bilaterally, no wheezing, rales or rhonchi Abdomen: soft nontender, nondistended, normal bowel sounds, no  organomegaly Extremities: no cyanosis, clubbing or edema noted bilaterally Neuro: Cranial nerves II-XII intact, no focal neurological deficits  Data Reviewed: Basic Metabolic Panel:  Recent Labs Lab 11/22/12 1230 11/24/12 0230 11/25/12 1803 11/26/12 0551  NA 135 135 142 138  K 4.5 3.6 3.4* 3.4*  CL 99 102 103 103  CO2 21 19 23 22   GLUCOSE 93 98 84 92  BUN 12 15 9 9   CREATININE 0.83 0.95 0.98 1.12*  CALCIUM 10.0 8.6 9.8 9.1   Liver Function Tests:  Recent Labs Lab 11/22/12 1230 11/24/12 0230 11/25/12 1803 11/26/12 0551  AST 23 19 23 20   ALT 21 15 16 14   ALKPHOS 124* 96 111 94  BILITOT 0.4 0.6 0.6 0.5  PROT 9.1* 7.5 8.7* 7.7  ALBUMIN 4.5 3.9 4.6 4.0    Recent Labs Lab 11/22/12 1230 11/24/12 0230 11/25/12 1803  LIPASE 13 17 28    No results found for this basename: AMMONIA,  in the last 168 hours CBC:  Recent Labs Lab 11/22/12 1230 11/24/12 0230 11/25/12 1803 11/26/12 0551  WBC 7.0 6.7 6.0 5.4  NEUTROABS 5.1 4.2 3.5 2.3  HGB 15.0 12.4 14.2 13.1  HCT 42.4 36.8 40.0 37.8  MCV 89.6 91.1 88.7 89.8  PLT 262 232 235 219   Cardiac Enzymes: No results found for this basename: CKTOTAL, CKMB, CKMBINDEX, TROPONINI,  in the last 168 hours BNP (last 3 results) No results found for this basename: PROBNP,  in the last 8760 hours CBG: No results found for this basename: GLUCAP,  in the last 168 hours  Micro Recent Results (from the past 240 hour(s))  URINE CULTURE     Status: None   Collection Time  11/22/12  2:11 PM      Result Value Range Status   Specimen Description URINE, RANDOM   Final   Special Requests NONE ADD [id number]   Final   Culture  Setup Time     Final   Value: 11/22/2012 22:00     Performed at [facility] Count     Final   Value: 80,000 COLONIES/ML     Performed at [facility]   Culture     Final   Value: ENTEROCOCCUS SPECIES     Performed at [facility]   Report Status 11/25/2012 FINAL   Final    Organism ID, Bacteria ENTEROCOCCUS SPECIES   Final  URINE CULTURE     Status: None   Collection Time    11/24/12  2:24 AM      Result Value Range Status   Specimen Description URINE, CLEAN CATCH   Final   Special Requests ADDED 0255   Final   Culture  Setup Time     Final   Value: 11/24/2012 03:41     Performed at [facility] Count     Final   Value: NO GROWTH     Performed at [facility]   Culture     Final   Value: NO GROWTH     Performed at [facility]   Report Status 11/25/2012 FINAL   Final     Studies: No results found.  Scheduled Meds: . feeding supplement (RESOURCE BREEZE)  1 Container Oral Q24H  . gabapentin  600 mg Oral TID  . levothyroxine  200 mcg Oral QAC breakfast  . polyethylene glycol  17 g Oral Daily  . potassium chloride  60 mEq Oral Once   Continuous Infusions: . dextrose 75 mL/hr at 11/26/12 0400       Time spent: 35 minutes    [facility] A  [facility] Pager [phone] If 7PM-7AM, please contact night-coverage at [email or web address], password [facility] 11/26/2012, 10:48 AM  LOS: 1 day

## 2012-11-26 NOTE — Progress Notes (Signed)
Pt admitted from the ED to room 5w07, with complaints of abdominal pain, pt is alert x4, rates abd pain 7/10, pt was made comfortable in bed, vital signs obtained, admitting MD was notified of pt's arrival as ordered, admission hx and assessment completed, pt was made to watch the safety video and sign the fall contract form. Pt has been oriented to floor, room and equipment. Will continue to monitor pt------Kalynn Declercq, rn

## 2012-11-27 LAB — CBC
HCT: 36.8 % (ref 36.0–46.0)
MCH: 30.3 pg (ref 26.0–34.0)
MCHC: 33.2 g/dL (ref 30.0–36.0)
Platelets: 160 10*3/uL (ref 150–400)
RDW: 14.6 % (ref 11.5–15.5)
WBC: 3.7 10*3/uL — ABNORMAL LOW (ref 4.0–10.5)

## 2012-11-27 LAB — BASIC METABOLIC PANEL
BUN: 7 mg/dL (ref 6–23)
Calcium: 9.1 mg/dL (ref 8.4–10.5)
Chloride: 103 mEq/L (ref 96–112)
Creatinine, Ser: 1.06 mg/dL (ref 0.50–1.10)
GFR calc Af Amer: 70 mL/min — ABNORMAL LOW (ref >90)
GFR calc non Af Amer: 61 mL/min — ABNORMAL LOW (ref >90)
Glucose, Bld: 86 mg/dL (ref 70–99)

## 2012-11-27 LAB — T4, FREE: Free T4: 0.73 ng/dL — ABNORMAL LOW (ref 0.80–1.80)

## 2012-11-27 LAB — T3: T3, Total: 46 ng/dl — ABNORMAL LOW (ref 80.0–204.0)

## 2012-11-27 MED ORDER — LEVOTHYROXINE SODIUM 125 MCG PO TABS
125.0000 ug | ORAL_TABLET | Freq: Once | ORAL | Status: AC
  Administered 2012-11-27: 125 ug via ORAL
  Filled 2012-11-27 (×2): qty 1

## 2012-11-27 MED ORDER — LEVOTHYROXINE SODIUM 200 MCG PO TABS
325.0000 ug | ORAL_TABLET | Freq: Every day | ORAL | Status: DC
  Administered 2012-11-28: 09:00:00 325 ug via ORAL
  Filled 2012-11-27 (×2): qty 1

## 2012-11-27 NOTE — Progress Notes (Signed)
[facility] PROGRESS NOTE  Adrienne Flores ZOX:[id number] DOB: March 03, 1965 DOA: 11/25/2012 PCP: Rolm Gala, MD  HPI/Subjective: Complaining about itching and morphine at controlling her pain and yesterday. She requested Dilaudid. Patient reporting that her pain control.  Assessment/Plan: Principal Problem:   Abdominal pain Active Problems:   Unspecified hypothyroidism    Abdominal pain -Acute on chronic abdominal pain, patient has multiple visits to different health systems with he mentioned complaints. -Unconfirmed diagnosis of acute intermittent porphyria, hematology/oncology consulted. -Porphyria workup. -Symptomatic management, carbohydrate loaded with D5W. -Continue pain control and nausea management. On clear liquids.  Hypothyroidism -TSH showed severe hypothyroidism with TSH of 81.5. Check T3 and free T4. -Her Synthroid switch back to 325 mcg.  Chronic pain/chronic narcotic use -Patient is on oxycodone at home for back problems. -Patient reported no relief with morphine, increasing morphine doses 2 mg. Patient might need Dilaudid for better pain control.  Constipation -CT scan showed some retained stool, start laxatives.  Code Status: Full code Family Communication: Plan discussed with the patient. Disposition Plan: Remains inpatient   Consultants:  Hematology  Procedures:  None  Antibiotics:  None   Objective: Filed Vitals:   11/27/12 1411  BP: 103/73  Pulse: 60  Temp: 98.1 F (36.7 C)  Resp: 16    Intake/Output Summary (Last 24 hours) at 11/27/12 1631 Last data filed at 11/27/12 1540  Gross per 24 hour  Intake 1993.75 ml  Output   1100 ml  Net 893.75 ml   Filed Weights   11/26/12 0040  Weight: 85.6 kg (188 lb 11.4 oz)    Exam: General: Alert and awake, oriented x3, not in any acute distress. HEENT: anicteric sclera, pupils reactive to light and accommodation, EOMI CVS: S1-S2 clear, no murmur rubs or gallops Chest:  clear to auscultation bilaterally, no wheezing, rales or rhonchi Abdomen: soft nontender, nondistended, normal bowel sounds, no organomegaly Extremities: no cyanosis, clubbing or edema noted bilaterally Neuro: Cranial nerves II-XII intact, no focal neurological deficits  Data Reviewed: Basic Metabolic Panel:  Recent Labs Lab 11/22/12 1230 11/24/12 0230 11/25/12 1803 11/26/12 0551 11/27/12 0548  NA 135 135 142 138 136  K 4.5 3.6 3.4* 3.4* 3.9  CL 99 102 103 103 103  CO2 21 19 23 22 21   GLUCOSE 93 98 84 92 86  BUN 12 15 9 9 7   CREATININE 0.83 0.95 0.98 1.12* 1.06  CALCIUM 10.0 8.6 9.8 9.1 9.1   Liver Function Tests:  Recent Labs Lab 11/22/12 1230 11/24/12 0230 11/25/12 1803 11/26/12 0551  AST 23 19 23 20   ALT 21 15 16 14   ALKPHOS 124* 96 111 94  BILITOT 0.4 0.6 0.6 0.5  PROT 9.1* 7.5 8.7* 7.7  ALBUMIN 4.5 3.9 4.6 4.0    Recent Labs Lab 11/22/12 1230 11/24/12 0230 11/25/12 1803  LIPASE 13 17 28    No results found for this basename: AMMONIA,  in the last 168 hours CBC:  Recent Labs Lab 11/22/12 1230 11/24/12 0230 11/25/12 1803 11/26/12 0551 11/27/12 0548  WBC 7.0 6.7 6.0 5.4 3.7*  NEUTROABS 5.1 4.2 3.5 2.3  --   HGB 15.0 12.4 14.2 13.1 12.2  HCT 42.4 36.8 40.0 37.8 36.8  MCV 89.6 91.1 88.7 89.8 91.5  PLT 262 232 235 219 160   Cardiac Enzymes: No results found for this basename: CKTOTAL, CKMB, CKMBINDEX, TROPONINI,  in the last 168 hours BNP (last 3 results) No results found for this basename: PROBNP,  in the last 8760 hours CBG: No results  found for this basename: GLUCAP,  in the last 168 hours  Micro Recent Results (from the past 240 hour(s))  URINE CULTURE     Status: None   Collection Time    11/22/12  2:11 PM      Result Value Range Status   Specimen Description URINE, RANDOM   Final   Special Requests NONE ADD [id number]   Final   Culture  Setup Time     Final   Value: 11/22/2012 22:00     Performed at [facility]  Count     Final   Value: 80,000 COLONIES/ML     Performed at [facility]   Culture     Final   Value: ENTEROCOCCUS SPECIES     Performed at [facility]   Report Status 11/25/2012 FINAL   Final   Organism ID, Bacteria ENTEROCOCCUS SPECIES   Final  URINE CULTURE     Status: None   Collection Time    11/24/12  2:24 AM      Result Value Range Status   Specimen Description URINE, CLEAN CATCH   Final   Special Requests ADDED 0255   Final   Culture  Setup Time     Final   Value: 11/24/2012 03:41     Performed at [facility] Count     Final   Value: NO GROWTH     Performed at [facility]   Culture     Final   Value: NO GROWTH     Performed at [facility]   Report Status 11/25/2012 FINAL   Final  CLOSTRIDIUM DIFFICILE BY PCR     Status: None   Collection Time    11/26/12 11:48 AM      Result Value Range Status   C difficile by pcr NEGATIVE  NEGATIVE Final     Studies: No results found.  Scheduled Meds: . feeding supplement (RESOURCE BREEZE)  1 Container Oral Q24H  . gabapentin  600 mg Oral TID  . [START ON 11/28/2012] levothyroxine  325 mcg Oral QAC breakfast  . polyethylene glycol  17 g Oral Daily   Continuous Infusions:       Time spent: 35 minutes    [facility] A  [facility] Pager [phone] If 7PM-7AM, please contact night-coverage at [email or web address], password [facility] 11/27/2012, 4:31 PM  LOS: 2 days

## 2012-11-28 ENCOUNTER — Other Ambulatory Visit: Payer: Self-pay | Admitting: Oncology

## 2012-11-28 ENCOUNTER — Telehealth: Payer: Self-pay | Admitting: Oncology

## 2012-11-28 MED ORDER — LEVOTHYROXINE SODIUM 300 MCG PO TABS: 325.0000 ug | ORAL_TABLET | Freq: Every day | ORAL | Status: DC

## 2012-11-28 MED ORDER — LEVOTHYROXINE SODIUM 25 MCG PO TABS: 25.0000 ug | ORAL_TABLET | Freq: Every day | ORAL | Status: DC

## 2012-11-28 MED ORDER — LEVOTHYROXINE SODIUM 300 MCG PO TABS: 300.0000 ug | ORAL_TABLET | Freq: Every day | ORAL | Status: DC

## 2012-11-28 MED ORDER — LEVOTHYROXINE SODIUM 25 MCG PO TABS: 325.0000 ug | ORAL_TABLET | Freq: Every day | ORAL | Status: DC

## 2012-11-28 NOTE — Care Management Note (Signed)
    Page 1 of 1   11/28/2012     11:40:32 AM   CARE MANAGEMENT NOTE 11/28/2012  Patient:  Adrienne Flores, Adrienne Flores   Account Number:  [id number]  Date Initiated:  11/28/2012  Documentation initiated by:  Letha Cape  Subjective/Objective Assessment:   dx abd pain  admit- lives with spouse, pta indep.     Action/Plan:   Anticipated DC Date:  11/28/2012   Anticipated DC Plan:  HOME/SELF CARE      DC Planning Services  CM consult      Choice offered to / List presented to:             Status of service:  Completed, signed off Medicare Important Message given?   (If response is "NO", the following Medicare IM given date fields will be blank) Date Medicare IM given:   Date Additional Medicare IM given:    Discharge Disposition:  HOME/SELF CARE  Per UR Regulation:  Reviewed for med. necessity/level of care/duration of stay  If discussed at Long Length of Stay Meetings, dates discussed:    Comments:  11/28/12 11:36 Letha Cape RN, BSN [phone] patient lives with spouse, patient for dc today, no needs anticipated.

## 2012-11-28 NOTE — Progress Notes (Signed)
COURTESY NOTE:  Porphyria workup labs in process-- no results available yet.  As my impression is that the patient will prove not to have porphyria, suggest GI consult and nutrition consult (to evaluate for possible dietary cause).  If patient is discharged I will arrange for follow-up through our office as far as the porphyria workup is concerned. The patient's chronic pain syndrome and other medical problems of course would be followed through her PCP.  Will continue to follow case to resolution of porphyria question.

## 2012-11-28 NOTE — Progress Notes (Signed)
NUTRITION FOLLOW-UP  DOCUMENTATION CODES Per approved criteria  -Obesity Unspecified   INTERVENTION: Continue Resource Breeze po daily, each supplement provides 250 kcal and 9 grams of protein. Monitor magnesium, potassium, and phosphorus daily for at least 3 days, MD to replete as needed, as pt is at risk for refeeding syndrome given pt's report of very poor oral intake. RD to continue to follow nutrition care plan.  NUTRITION DIAGNOSIS: Inadequate oral intake related to poor appetite and chronic GI distress as evidenced by pt report. Slowly improving.  Goal: Diet advancement as tolerated - met. Intake to meet >90% of estimated nutrition needs.  Monitor:  weight trends, lab trends, I/O's, PO intake, supplement tolerance, further diet advancement  ASSESSMENT: PMHx significant for fibromyalgia, hypothyroidism, rheumatoid arthritis/lupus. Admitted with abdominal pain, n/v/d, mild stool burden. 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  Discussed patient during progression rounds. Pt with severe hypothyroidism of TSH of 81.5. MD suspects that patient's abdominal issues are stemming from her uncontrolled hypothyroidism.  Advanced to Regular diet this morning. Abdominal pain improving. Likely d/c home today. Oral intake slowly improving, consuming 25% of meals.  Pt is at nutrition risk given her very poor oral intake and is at risk for refeeding syndrome. Current potassium is now normal after repletion, no magnesium or phosphorus is available.  Height: Ht Readings from Last 1 Encounters:  11/26/12 5\' 4"  (1.626 m)    Weight: Wt Readings from Last 1 Encounters:  11/26/12 188 lb 11.4 oz (85.6 kg)   BMI:  Body mass index is 32.38 kg/(m^2). Obese Class I  Estimated Nutritional Needs: Kcal: 1500 - 1750 Protein: 80 - 90 g Fluid: 1.5 - 1.8 liters  Skin: intact  Diet Order: General  EDUCATION NEEDS: -No education needs identified at this time   Intake/Output Summary (Last 24 hours)  at 11/28/12 1014 Last data filed at 11/27/12 2200  Gross per 24 hour  Intake    160 ml  Output    600 ml  Net   -440 ml    Last BM: 10/14  Labs:   Recent Labs Lab 11/25/12 1803 11/26/12 0551 11/27/12 0548  NA 142 138 136  K 3.4* 3.4* 3.9  CL 103 103 103  CO2 23 22 21   BUN 9 9 7   CREATININE 0.98 1.12* 1.06  CALCIUM 9.8 9.1 9.1  GLUCOSE 84 92 86   TSH  Date Value Range Status  11/26/2012 81.505* 0.350 - 4.500 uIU/mL Final     Performed at [facility]    CBG (last 3)  No results found for this basename: GLUCAP,  in the last 72 hours  Scheduled Meds: . feeding supplement (RESOURCE BREEZE)  1 Container Oral Q24H  . gabapentin  600 mg Oral TID  . levothyroxine  325 mcg Oral QAC breakfast  . polyethylene glycol  17 g Oral Daily    Continuous Infusions: none    Jarold Motto MS, RD, LDN Pager: [phone] After-hours pager: [phone]

## 2012-11-28 NOTE — Telephone Encounter (Signed)
Per 10/15 pof schedule pt 11/14. pof forwarded to Tiffany in HIM this is not an established pt.

## 2012-11-28 NOTE — Discharge Summary (Signed)
Physician Discharge Summary  Adrienne Flores:[id number] DOB: 1965/11/05 DOA: 11/25/2012  PCP: Rolm Gala, MD  Admit date: 11/25/2012 Discharge date: 11/28/2012  Time spent: 40 minutes  Recommendations for Outpatient Follow-up:  1. Follow up with PCP in 1 week. 2. Check TSH in 6 weeks.  Discharge Diagnoses:  Principal Problem:   Abdominal pain Active Problems:   Unspecified hypothyroidism   Discharge Condition: Stable  Diet recommendation: Regular  Filed Weights   11/26/12 0040  Weight: 85.6 kg (188 lb 11.4 oz)    History of present illness:  Adrienne Flores is a 47 y.o. female with history of fibromyalgia, hypothyroidism, rheumatoid arthritis/lupus presently on no medications presented to the ER because of persistent abdominal pain for last 3 days. The pain is mostly on the right upper and lower quadrants stabbing in nature with nausea vomiting and diarrhea. CT abdomen pelvis done in the ER only showed mild stool burden. 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 Hematologist on call was consulted and at this time further tests confirmed acute porphyria has been ordered and patient was admitted for further management. Patient denies any fever chills chest pain shortness of breath. Denies any upper or lower extremity weakness numbness.   Hospital Course:   1. Abdominal pain: Presented with acute on chronic abdominal pain, patient has multiple visits to different health systems with the above mentioned complaints. She has a confirmed diagnosis of acute intermittent porphyria, hematology/oncology consulted, porphyria workup was initiated, it is sent out and it's going to take some time for the results to come back, hematology/oncology recommended followup as outpatient with them. At the time of admission symptomatic management was initiated, carbohydrate load with D5W infusion, pain control with IV narcotics as well as nausea management with IV Zofran. Patient was on  clear liquids, her diet advanced to regular diet and she was able to tolerate that.  2. Hypothyroidism: Profound hypothyroidism, TSH was 81.505, free T4 0.73 and total T3 is 46. Review of the record shows patient's TSH is extremely elevated since May of 2012. Patient said she is being taking 200 mcg of levothyroxine, recent notes from Dr. Raye Sorrow office shows that she was placed on 325 mcg of levothyroxine. Patient was only taking 200 mcg, instructed to take the full dose of 325, appropriate prescriptions were given. Recommend to check TSH in 6 weeks. Her severe hypothyroidism might account for her abdominal symptoms as she complaining about abdominal pain and constipation very frequently.  3. Chronic pain/chronic narcotic use: Patient taking oxycodone at home for back problems, that was continued throughout the hospital stay, patient requested Dilaudid for pain control while she is in the hospital. She also complaining about chronic fatigue and fibromyalgia.  4. Constipation: CT scan showed retained stool, start a laxative and patient instructed to take MiraLax daily at home.  Procedures:  None  Consultations:  Dr. Darnelle Catalan, Hem/Onc  Discharge Exam: Filed Vitals:   11/28/12 0511  BP: 101/57  Pulse: 78  Temp: 98.6 F (37 C)  Resp: 16  General: Alert and awake, oriented x3, not in any acute distress. HEENT: anicteric sclera, pupils reactive to light and accommodation, EOMI CVS: S1-S2 clear, no murmur rubs or gallops Chest: clear to auscultation bilaterally, no wheezing, rales or rhonchi Abdomen: soft nontender, nondistended, normal bowel sounds, no organomegaly Extremities: no cyanosis, clubbing or edema noted bilaterally Neuro: Cranial nerves II-XII intact, no focal neurological deficits  Discharge Instructions  Discharge Orders   Future Orders Complete By Expires   Increase  activity slowly  As directed        Medication List         ALPRAZolam 0.25 MG tablet  Commonly  known as:  XANAX  Take 0.25 mg by mouth 3 (three) times daily as needed for anxiety.     dicyclomine 20 MG tablet  Commonly known as:  BENTYL  Take 1 tablet (20 mg total) by mouth every 6 (six) hours as needed (for abdominal cramping).     gabapentin 600 MG tablet  Commonly known as:  NEURONTIN  Take 600 mg by mouth 3 (three) times daily.     levothyroxine 25 MCG tablet  Commonly known as:  SYNTHROID, LEVOTHROID  Take 1 tablet (25 mcg total) by mouth daily before breakfast.     levothyroxine 300 MCG tablet  Commonly known as:  SYNTHROID, LEVOTHROID  Take 1 tablet (300 mcg total) by mouth daily before breakfast.     multivitamin with minerals Tabs tablet  Take 1 tablet by mouth daily.     naproxen sodium 220 MG tablet  Commonly known as:  ANAPROX  Take 220 mg by mouth daily as needed (for arthritis).     ondansetron 8 MG disintegrating tablet  Commonly known as:  ZOFRAN ODT  Take 1 tablet (8 mg total) by mouth every 8 (eight) hours as needed for nausea.     oxycodone 5 MG capsule  Commonly known as:  OXY-IR  Take 5 mg by mouth every 4 (four) hours as needed for pain.     polyethylene glycol packet  Commonly known as:  MIRALAX / GLYCOLAX  Take 17 g by mouth daily.     promethazine 25 MG tablet  Commonly known as:  PHENERGAN  Take 1 tablet (25 mg total) by mouth every 6 (six) hours as needed for nausea.     tizanidine 2 MG capsule  Commonly known as:  ZANAFLEX  Take 4 mg by mouth 3 (three) times daily as needed.       Allergies  Allergen Reactions  . Methotrexate Derivatives Other (See Comments)    headaches       Follow-up Information   Follow up with Rolm Gala, MD In 1 week.   Specialty:  Family Medicine   Contact information:   [address] [place] [address] [phone]        The results of significant diagnostics from this hospitalization (including imaging, microbiology, ancillary and laboratory) are listed below for reference.     Significant Diagnostic Studies: Ct Abdomen Pelvis W Contrast  11/24/2012   *RADIOLOGY REPORT*  Clinical Data: Abdominal pain.  CT ABDOMEN AND PELVIS WITH CONTRAST  Technique:  Multidetector CT imaging of the abdomen and pelvis was performed following the standard protocol during bolus administration of intravenous contrast.  Contrast: OMNIPAQUE IOHEXOL 300 MG/ML  SOLN  Comparison: None available at time of study interpretation.  Findings: Limited view of the lung bases are clear.  The included heart and pericardium are unremarkable.  The liver, spleen, pancreas, and adrenal glands are normal in size, morphology and enhancement characteristics.  Status post cholecystectomy.  The stomach, small and large bowel are normal in course and caliber without definite wall thickening or inflammatory changes though sensitivity may be decreased as contrast has yet to reach the distal colon.  A few scattered sigmoid diverticula. Mild amount of retained large bowel stool.  Normal appendix.  No free fluid nor free air.  The kidneys are well-located, demonstrating normal morphology, size  and enhancement without renal masses, nephrolithiasis or hydronephrosis.  Ureters are unremarkable.  Urinary bladder is partially distended and appears normal.  Great vessels are normal in course and caliber with mild calcific atherosclerosis.  No lymphadenopathy by CT size criteria. Internal reproductive organs are surgically absent.  The included soft tissues and osseous structures are non-suspicious. Mild levoscoliosis could be positional.  IMPRESSION: Mild amount of retained large bowel stool, no bowel obstruction. No acute intra-abdominal pelvic process.  Status post cholecystectomy, hysterectomy.   Original Report Authenticated By: Awilda Metro    Microbiology: Recent Results (from the past 240 hour(s))  URINE CULTURE     Status: None   Collection Time    11/22/12  2:11 PM      Result Value Range Status   Specimen  Description URINE, RANDOM   Final   Special Requests NONE ADD [id number]   Final   Culture  Setup Time     Final   Value: 11/22/2012 22:00     Performed at [facility] Count     Final   Value: 80,000 COLONIES/ML     Performed at [facility]   Culture     Final   Value: ENTEROCOCCUS SPECIES     Performed at [facility]   Report Status 11/25/2012 FINAL   Final   Organism ID, Bacteria ENTEROCOCCUS SPECIES   Final  URINE CULTURE     Status: None   Collection Time    11/24/12  2:24 AM      Result Value Range Status   Specimen Description URINE, CLEAN CATCH   Final   Special Requests ADDED 0255   Final   Culture  Setup Time     Final   Value: 11/24/2012 03:41     Performed at [facility] Count     Final   Value: NO GROWTH     Performed at [facility]   Culture     Final   Value: NO GROWTH     Performed at [facility]   Report Status 11/25/2012 FINAL   Final  CLOSTRIDIUM DIFFICILE BY PCR     Status: None   Collection Time    11/26/12 11:48 AM      Result Value Range Status   C difficile by pcr NEGATIVE  NEGATIVE Final  STOOL CULTURE     Status: None   Collection Time    11/26/12 11:48 AM      Result Value Range Status   Specimen Description STOOL   Final   Special Requests NONE   Final   Culture     Final   Value: NO SUSPICIOUS COLONIES, CONTINUING TO HOLD     Performed at [facility]   Report Status PENDING   Incomplete     Labs: Basic Metabolic Panel:  Recent Labs Lab 11/22/12 1230 11/24/12 0230 11/25/12 1803 11/26/12 0551 11/27/12 0548  NA 135 135 142 138 136  K 4.5 3.6 3.4* 3.4* 3.9  CL 99 102 103 103 103  CO2 21 19 23 22 21   GLUCOSE 93 98 84 92 86  BUN 12 15 9 9 7   CREATININE 0.83 0.95 0.98 1.12* 1.06  CALCIUM 10.0 8.6 9.8 9.1 9.1   Liver Function Tests:  Recent Labs Lab 11/22/12 1230 11/24/12 0230 11/25/12 1803 11/26/12 0551  AST 23 19 23 20   ALT 21 15 16 14    ALKPHOS 124* 96 111  94  BILITOT 0.4 0.6 0.6 0.5  PROT 9.1* 7.5 8.7* 7.7  ALBUMIN 4.5 3.9 4.6 4.0    Recent Labs Lab 11/22/12 1230 11/24/12 0230 11/25/12 1803  LIPASE 13 17 28    No results found for this basename: AMMONIA,  in the last 168 hours CBC:  Recent Labs Lab 11/22/12 1230 11/24/12 0230 11/25/12 1803 11/26/12 0551 11/27/12 0548  WBC 7.0 6.7 6.0 5.4 3.7*  NEUTROABS 5.1 4.2 3.5 2.3  --   HGB 15.0 12.4 14.2 13.1 12.2  HCT 42.4 36.8 40.0 37.8 36.8  MCV 89.6 91.1 88.7 89.8 91.5  PLT 262 232 235 219 160   Cardiac Enzymes: No results found for this basename: CKTOTAL, CKMB, CKMBINDEX, TROPONINI,  in the last 168 hours BNP: BNP (last 3 results) No results found for this basename: PROBNP,  in the last 8760 hours CBG: No results found for this basename: GLUCAP,  in the last 168 hours     Signed:  Stephane Junkins A  [facility] 11/28/2012, 11:26 AM

## 2012-11-30 LAB — STOOL CULTURE

## 2012-12-01 ENCOUNTER — Telehealth: Payer: Self-pay | Admitting: Oncology

## 2012-12-01 LAB — PORPHOBILINOGEN, RANDOM URINE: Quantitative Porphobilinogen: 0 mg/g creat (ref <2.0)

## 2012-12-01 NOTE — Telephone Encounter (Signed)
lmonvm advising the pt of her appt in November.

## 2012-12-02 ENCOUNTER — Encounter (HOSPITAL_COMMUNITY): Payer: Self-pay | Admitting: Emergency Medicine

## 2012-12-02 ENCOUNTER — Emergency Department (HOSPITAL_COMMUNITY)
Admission: EM | Admit: 2012-12-02 | Discharge: 2012-12-02 | Disposition: A | Discharge status: Home / Self Care | Payer: Medicare Other | Attending: Emergency Medicine | Admitting: Emergency Medicine

## 2012-12-02 DIAGNOSIS — Z8719 Personal history of other diseases of the digestive system: Secondary | ICD-10-CM | POA: Insufficient documentation

## 2012-12-02 DIAGNOSIS — G8929 Other chronic pain: Secondary | ICD-10-CM | POA: Insufficient documentation

## 2012-12-02 DIAGNOSIS — Z3202 Encounter for pregnancy test, result negative: Secondary | ICD-10-CM | POA: Insufficient documentation

## 2012-12-02 DIAGNOSIS — I10 Essential (primary) hypertension: Secondary | ICD-10-CM | POA: Insufficient documentation

## 2012-12-02 DIAGNOSIS — Z885 Allergy status to narcotic agent status: Secondary | ICD-10-CM | POA: Insufficient documentation

## 2012-12-02 DIAGNOSIS — R112 Nausea with vomiting, unspecified: Secondary | ICD-10-CM | POA: Insufficient documentation

## 2012-12-02 DIAGNOSIS — IMO0001 Reserved for inherently not codable concepts without codable children: Secondary | ICD-10-CM | POA: Insufficient documentation

## 2012-12-02 DIAGNOSIS — E079 Disorder of thyroid, unspecified: Secondary | ICD-10-CM | POA: Insufficient documentation

## 2012-12-02 DIAGNOSIS — R109 Unspecified abdominal pain: Secondary | ICD-10-CM | POA: Insufficient documentation

## 2012-12-02 DIAGNOSIS — M329 Systemic lupus erythematosus, unspecified: Secondary | ICD-10-CM | POA: Insufficient documentation

## 2012-12-02 DIAGNOSIS — Z888 Allergy status to other drugs, medicaments and biological substances status: Secondary | ICD-10-CM | POA: Insufficient documentation

## 2012-12-02 DIAGNOSIS — IMO0002 Reserved for concepts with insufficient information to code with codable children: Secondary | ICD-10-CM | POA: Insufficient documentation

## 2012-12-02 DIAGNOSIS — M069 Rheumatoid arthritis, unspecified: Secondary | ICD-10-CM | POA: Insufficient documentation

## 2012-12-02 DIAGNOSIS — Z79899 Other long term (current) drug therapy: Secondary | ICD-10-CM | POA: Insufficient documentation

## 2012-12-02 LAB — CBC WITH DIFFERENTIAL/PLATELET
Basophils Absolute: 0 10*3/uL (ref 0.0–0.1)
Basophils Relative: 0 % (ref 0–1)
Eosinophils Absolute: 0.1 10*3/uL (ref 0.0–0.7)
Hemoglobin: 14.3 g/dL (ref 12.0–15.0)
Lymphs Abs: 1.6 10*3/uL (ref 0.7–4.0)
MCH: 31.2 pg (ref 26.0–34.0)
MCHC: 34.1 g/dL (ref 30.0–36.0)
Monocytes Absolute: 0.3 10*3/uL (ref 0.1–1.0)
Monocytes Relative: 6 % (ref 3–12)
Neutro Abs: 3.2 10*3/uL (ref 1.7–7.7)
Neutrophils Relative %: 62 % (ref 43–77)
RDW: 14.3 % (ref 11.5–15.5)
WBC: 5.2 10*3/uL (ref 4.0–10.5)

## 2012-12-02 LAB — LIPASE, BLOOD: Lipase: 15 U/L (ref 11–59)

## 2012-12-02 LAB — URINALYSIS, ROUTINE W REFLEX MICROSCOPIC
Glucose, UA: NEGATIVE mg/dL
Nitrite: NEGATIVE
Specific Gravity, Urine: 1.015 (ref 1.005–1.030)
pH: 7 (ref 5.0–8.0)

## 2012-12-02 LAB — PREGNANCY, URINE: Preg Test, Ur: NEGATIVE

## 2012-12-02 LAB — COMPREHENSIVE METABOLIC PANEL
ALT: 15 U/L (ref 0–35)
AST: 21 U/L (ref 0–37)
Albumin: 4.4 g/dL (ref 3.5–5.2)
BUN: 10 mg/dL (ref 6–23)
CO2: 22 mEq/L (ref 19–32)
Chloride: 102 mEq/L (ref 96–112)
Creatinine, Ser: 0.91 mg/dL (ref 0.50–1.10)
GFR calc non Af Amer: 73 mL/min — ABNORMAL LOW (ref >90)
Potassium: 3.7 mEq/L (ref 3.5–5.1)
Sodium: 139 mEq/L (ref 135–145)
Total Protein: 8.3 g/dL (ref 6.0–8.3)

## 2012-12-02 LAB — URINE MICROSCOPIC-ADD ON

## 2012-12-02 MED ORDER — HYDROMORPHONE HCL PF 1 MG/ML IJ SOLN
1.0000 mg | Freq: Once | INTRAMUSCULAR | Status: AC
  Administered 2012-12-02: 1 mg via INTRAMUSCULAR
  Filled 2012-12-02: qty 1

## 2012-12-02 MED ORDER — ONDANSETRON 4 MG PO TBDP
4.0000 mg | ORAL_TABLET | Freq: Once | ORAL | Status: AC
  Administered 2012-12-02: 4 mg via ORAL
  Filled 2012-12-02: qty 1

## 2012-12-02 MED ORDER — ONDANSETRON HCL 4 MG/2ML IJ SOLN
4.0000 mg | Freq: Once | INTRAMUSCULAR | Status: DC
  Filled 2012-12-02: qty 2

## 2012-12-02 MED ORDER — SODIUM CHLORIDE 0.9 % IV SOLN: 1000.0000 mL | Freq: Once | INTRAVENOUS | Status: DC

## 2012-12-02 MED ORDER — HYDROMORPHONE HCL PF 1 MG/ML IJ SOLN
1.0000 mg | Freq: Once | INTRAMUSCULAR | Status: AC
  Administered 2012-12-02: 1 mg via INTRAMUSCULAR

## 2012-12-02 MED ORDER — HYDROMORPHONE HCL PF 1 MG/ML IJ SOLN
1.0000 mg | Freq: Once | INTRAMUSCULAR | Status: DC
  Filled 2012-12-02: qty 1

## 2012-12-02 NOTE — ED Provider Notes (Signed)
CSN: [id number]     Arrival date & time 12/02/12  1100 History   First MD Initiated Contact with Patient 12/02/12 1112     Chief Complaint  Patient presents with  . Abdominal Pain   (Consider location/radiation/quality/duration/timing/severity/associated sxs/prior Treatment) HPI Patient presents with abdominal pain. This episode began yesterday.  However, the patient has had pain in a similar location for approximately 6 or 7 years. She was admitted one week ago for similar pain. She notes that since discharge she was initially better, but became worse yesterday. Since yesterday she's had persistent pain in the right lower abdomen, in the right lateral abdomen. The pain is sharp, severe, not improved with narcotics. There is associated nausea, one vomiting episode yesterday.  No loose stool, no diarrhea. Fevers, no chills. Patient is currently being evaluated for porphyria, though results are not available. Past Medical History  Diagnosis Date  . Rheumatoid arthritis   . Gastroparesis   . Degenerative disc disease   . Fibromyalgia   . Diverticulitis   . Thyroid disease   . Lupus    Past Surgical History  Procedure Laterality Date  . Cesarean section    . Replacement total knee Bilateral   . Abdominal hysterectomy    . Cholecystectomy     Family History  Problem Relation Age of Onset  . Leukemia Mother    History  Substance Use Topics  . Smoking status: Never Smoker   . Smokeless tobacco: Not on file  . Alcohol Use: Yes     Comment: occasionally   OB History   Grav Para Term Preterm Abortions TAB SAB Ect Mult Living                 Review of Systems  Constitutional:       Per HPI, otherwise negative  HENT:       Per HPI, otherwise negative  Respiratory:       Per HPI, otherwise negative  Cardiovascular:       Per HPI, otherwise negative  Gastrointestinal: Positive for nausea, vomiting and abdominal pain. Negative for diarrhea.  Endocrine:       Negative  aside from HPI  Genitourinary:       Neg aside from HPI   Musculoskeletal:       Per HPI, otherwise negative  Skin: Negative.   Neurological: Negative for syncope.    Allergies  Methotrexate derivatives and Morphine and related  Home Medications   Current Outpatient Rx  Name  Route  Sig  Dispense  Refill  . ALPRAZolam (XANAX) 0.25 MG tablet   Oral   Take 0.25 mg by mouth 3 (three) times daily as needed for anxiety.         . dicyclomine (BENTYL) 20 MG tablet   Oral   Take 1 tablet (20 mg total) by mouth every 6 (six) hours as needed (for abdominal cramping).   20 tablet   0   . gabapentin (NEURONTIN) 600 MG tablet   Oral   Take 600 mg by mouth 3 (three) times daily.         [name] levothyroxine (SYNTHROID, LEVOTHROID) 25 MCG tablet   Oral   Take 25 mcg by mouth daily before breakfast. Take with to = total         . levothyroxine (SYNTHROID, LEVOTHROID) 300 MCG tablet   Oral   Take 300 mcg by mouth daily before breakfast. Take with a tablet to =         .  Multiple Vitamin (MULTIVITAMIN WITH MINERALS) TABS tablet   Oral   Take 1 tablet by mouth daily.         . naproxen sodium (ANAPROX) 220 MG tablet   Oral   Take 220 mg by mouth daily as needed (for arthritis).         . ondansetron (ZOFRAN ODT) 8 MG disintegrating tablet   Oral   Take 1 tablet (8 mg total) by mouth every 8 (eight) hours as needed for nausea.   20 tablet   0   . oxycodone (OXY-IR) 5 MG capsule   Oral   Take 5 mg by mouth every 4 (four) hours as needed for pain.          . polyethylene glycol (MIRALAX / GLYCOLAX) packet   Oral   Take 17 g by mouth daily.   30 each   0   . promethazine (PHENERGAN) 25 MG tablet   Oral   Take 1 tablet (25 mg total) by mouth every 6 (six) hours as needed for nausea.   20 tablet   0   . tizanidine (ZANAFLEX) 2 MG capsule   Oral   Take 4 mg by mouth 3 (three) times daily as needed.           BP 132/107  Pulse 83   Temp(Src) 98 F (36.7 C) (Oral)  Resp 20  SpO2 99% Physical Exam  Nursing note and vitals reviewed. Constitutional: She is oriented to person, place, and time. She appears well-developed and well-nourished. No distress.  Tearful elderly appearing female  HENT:  Head: Normocephalic and atraumatic.  Eyes: Conjunctivae and EOM are normal.  Cardiovascular: Normal rate and regular rhythm.   Pulmonary/Chest: Effort normal and breath sounds normal. No stridor. No respiratory distress.  Abdominal: She exhibits no distension.  Minimal discomfort with palpation, even in the areas that the patient identifies as being particularly painful.  Musculoskeletal: She exhibits no edema.  Neurological: She is alert and oriented to person, place, and time. No cranial nerve deficit.  Skin: Skin is warm and dry.  Psychiatric: She has a normal mood and affect.    ED Course  Procedures (including critical care time) Labs Review Labs Reviewed  CBC WITH DIFFERENTIAL  COMPREHENSIVE METABOLIC PANEL  LIPASE, BLOOD  URINALYSIS, ROUTINE W REFLEX MICROSCOPIC  PREGNANCY, URINE   Imaging Review No results found.  EKG Interpretation   None      review of patient's chart and stress that she had CT scan one week ago that was unremarkable. Porphyria labs are pending.  3:16 PM Patient in no distress. We had a lengthy discussion about the need for ongoing care with her GI team, her primary care team.   MDM  No diagnosis found. This patient with acute on chronic abdominal pain is in no distress on my exam, with an essentially soft abdomen.  Throughout the patient's emergency department course, she had decompensation.  She did have brief episodes of hypertension, but was essentially hemodynamic stable.  Patient had recent CT scan that was reassuring.  She is currently being evaluated for porphyria by hematology. Patient had some decrease in pain here, and absence of distress, with a well-documented history of  chronic abdominal pain, she was appropriate for discharge with outpatient followup.    Gerhard Munch, MD 12/02/12 [phone]

## 2012-12-02 NOTE — ED Notes (Signed)
Pt with very difficult iv acess.

## 2012-12-02 NOTE — ED Notes (Signed)
Pt tolerated pain medication well.

## 2012-12-02 NOTE — ED Notes (Signed)
Pt presented to ED with abdominal pain and nausea and vomiting since last night.

## 2012-12-02 NOTE — ED Notes (Signed)
Pt knows that urine is needed. Pt is unable to void at this time.  

## 2012-12-02 NOTE — ED Notes (Signed)
Pt discharged.Vital signs stable and GCS 15.Discharge instruction given. 

## 2012-12-03 LAB — URINE CULTURE

## 2012-12-03 LAB — AMINOLEVULINIC ACID, 24 HOUR
Creatinine, Urine mg/day-ALA: 0.788 g/(24.h) — ABNORMAL LOW (ref 0.800–2.000)
Total Volume - ALA: 1050 mL

## 2012-12-05 LAB — PORPHYRINS, FRACTIONATED, RANDOM URINE
Coproporphyrin III (PORFRU): 74 mcg/g creat (ref 4.1–76.4)
Coproporphyrin, random ur: 20.1 mcg/g creat (ref 5.6–28.6)
Total Porphyrins, random ur: 128.4 mcg/g creat (ref 23.3–132.4)
Uroporphyrin, random ur: 24.5 mcg/g creat — ABNORMAL HIGH (ref 3.1–18.2)

## 2012-12-05 LAB — PORPHOBILINOGEN, 24 HR URINE-QUANT: Total Volume - PORPQ: 1050 mL

## 2012-12-06 ENCOUNTER — Emergency Department: Payer: Self-pay | Admitting: Emergency Medicine

## 2012-12-06 LAB — CBC
HCT: 42 % (ref 35.0–47.0)
HGB: 14.5 g/dL (ref 12.0–16.0)
Platelet: 215 10*3/uL (ref 150–440)
RBC: 4.7 10*6/uL (ref 3.80–5.20)
RDW: 14.3 % (ref 11.5–14.5)
WBC: 6.1 10*3/uL (ref 3.6–11.0)

## 2012-12-06 LAB — URINALYSIS, COMPLETE
Bilirubin,UR: NEGATIVE
Blood: NEGATIVE
RBC,UR: 1 /HPF (ref 0–5)
WBC UR: 4 /HPF (ref 0–5)

## 2012-12-06 LAB — COMPREHENSIVE METABOLIC PANEL
Anion Gap: 8 (ref 7–16)
Bilirubin,Total: 0.6 mg/dL (ref 0.2–1.0)
Calcium, Total: 9.2 mg/dL (ref 8.5–10.1)
Co2: 23 mmol/L (ref 21–32)
EGFR (Non-African Amer.): >60
Osmolality: 276 (ref 275–301)
Potassium: 3.7 mmol/L (ref 3.5–5.1)
SGOT(AST): 32 U/L (ref 15–37)
Total Protein: 8.3 g/dL — ABNORMAL HIGH (ref 6.4–8.2)

## 2012-12-08 ENCOUNTER — Emergency Department (HOSPITAL_COMMUNITY)
Admission: EM | Admit: 2012-12-08 | Discharge: 2012-12-08 | Disposition: A | Discharge status: Home / Self Care | Payer: Medicare Other | Attending: Emergency Medicine | Admitting: Emergency Medicine

## 2012-12-08 ENCOUNTER — Encounter (HOSPITAL_COMMUNITY): Payer: Self-pay | Admitting: Emergency Medicine

## 2012-12-08 ENCOUNTER — Emergency Department (HOSPITAL_COMMUNITY): Payer: Medicare Other

## 2012-12-08 DIAGNOSIS — M069 Rheumatoid arthritis, unspecified: Secondary | ICD-10-CM | POA: Insufficient documentation

## 2012-12-08 DIAGNOSIS — R112 Nausea with vomiting, unspecified: Secondary | ICD-10-CM | POA: Insufficient documentation

## 2012-12-08 DIAGNOSIS — Z8719 Personal history of other diseases of the digestive system: Secondary | ICD-10-CM | POA: Insufficient documentation

## 2012-12-08 DIAGNOSIS — E079 Disorder of thyroid, unspecified: Secondary | ICD-10-CM | POA: Insufficient documentation

## 2012-12-08 DIAGNOSIS — IMO0001 Reserved for inherently not codable concepts without codable children: Secondary | ICD-10-CM | POA: Insufficient documentation

## 2012-12-08 DIAGNOSIS — R197 Diarrhea, unspecified: Secondary | ICD-10-CM | POA: Insufficient documentation

## 2012-12-08 DIAGNOSIS — M549 Dorsalgia, unspecified: Secondary | ICD-10-CM | POA: Insufficient documentation

## 2012-12-08 DIAGNOSIS — Z79899 Other long term (current) drug therapy: Secondary | ICD-10-CM | POA: Insufficient documentation

## 2012-12-08 DIAGNOSIS — R1084 Generalized abdominal pain: Secondary | ICD-10-CM | POA: Insufficient documentation

## 2012-12-08 LAB — CBC WITH DIFFERENTIAL/PLATELET
Basophils Absolute: 0 10*3/uL (ref 0.0–0.1)
Basophils Relative: 1 % (ref 0–1)
Eosinophils Absolute: 0.1 10*3/uL (ref 0.0–0.7)
Eosinophils Relative: 1 % (ref 0–5)
MCH: 31.7 pg (ref 26.0–34.0)
MCHC: 35.1 g/dL (ref 30.0–36.0)
MCV: 90.3 fL (ref 78.0–100.0)
Monocytes Relative: 6 % (ref 3–12)
Neutro Abs: 3.8 10*3/uL (ref 1.7–7.7)
Neutrophils Relative %: 58 % (ref 43–77)
Platelets: 248 10*3/uL (ref 150–400)
RDW: 13.8 % (ref 11.5–15.5)
WBC: 6.5 10*3/uL (ref 4.0–10.5)

## 2012-12-08 LAB — URINE MICROSCOPIC-ADD ON

## 2012-12-08 LAB — COMPREHENSIVE METABOLIC PANEL
ALT: 17 U/L (ref 0–35)
AST: 20 U/L (ref 0–37)
Albumin: 4.7 g/dL (ref 3.5–5.2)
BUN: 15 mg/dL (ref 6–23)
Calcium: 10.3 mg/dL (ref 8.4–10.5)
Chloride: 101 mEq/L (ref 96–112)
GFR calc Af Amer: 82 mL/min — ABNORMAL LOW (ref >90)
Glucose, Bld: 99 mg/dL (ref 70–99)
Sodium: 135 mEq/L (ref 135–145)
Total Protein: 8.9 g/dL — ABNORMAL HIGH (ref 6.0–8.3)

## 2012-12-08 LAB — URINALYSIS, ROUTINE W REFLEX MICROSCOPIC
Glucose, UA: NEGATIVE mg/dL
Hgb urine dipstick: NEGATIVE
Ketones, ur: 15 mg/dL — AB
Specific Gravity, Urine: 1.029 (ref 1.005–1.030)
Urobilinogen, UA: 1 mg/dL (ref 0.0–1.0)
pH: 5.5 (ref 5.0–8.0)

## 2012-12-08 MED ORDER — HYDROMORPHONE HCL PF 2 MG/ML IJ SOLN
2.0000 mg | Freq: Once | INTRAMUSCULAR | Status: AC
  Administered 2012-12-08: 2 mg via INTRAMUSCULAR
  Filled 2012-12-08: qty 1

## 2012-12-08 MED ORDER — METOCLOPRAMIDE HCL 10 MG PO TABS: 10.0000 mg | ORAL_TABLET | Freq: Four times a day (QID) | ORAL | Status: DC | PRN

## 2012-12-08 MED ORDER — POTASSIUM CHLORIDE CRYS ER 20 MEQ PO TBCR
40.0000 meq | EXTENDED_RELEASE_TABLET | Freq: Once | ORAL | Status: AC
  Administered 2012-12-08: 40 meq via ORAL
  Filled 2012-12-08: qty 2

## 2012-12-08 MED ORDER — METOCLOPRAMIDE HCL 5 MG/ML IJ SOLN
10.0000 mg | Freq: Once | INTRAMUSCULAR | Status: AC
  Administered 2012-12-08: 10 mg via INTRAMUSCULAR
  Filled 2012-12-08: qty 2

## 2012-12-08 NOTE — ED Provider Notes (Signed)
CSN: [id number]     Arrival date & time 12/08/12  1519 History   First MD Initiated Contact with Patient 12/08/12 1903     Chief Complaint  Patient presents with  . Abdominal Pain  . Emesis  . Diarrhea   (Consider location/radiation/quality/duration/timing/severity/associated sxs/prior Treatment) HPI Complains of abdominal pain diffuse was at lower abdomen onset 2 days ago constant accompanied by diarrhea episodes today and nausea. She vomited 3 or 4 times yesterday. No blood per rectum no hematemesis. She is treated herself with antiemetics and her oxycodone without relief. She's been suffering similar episodes of abdominal pain for the past 7.5 years. Nothing makes symptoms better or worse. She does report a chair family members with vomiting diarrhea. Past Medical History  Diagnosis Date  . Rheumatoid arthritis   . Gastroparesis   . Degenerative disc disease   . Fibromyalgia   . Diverticulitis   . Thyroid disease   . Lupus    Past Surgical History  Procedure Laterality Date  . Cesarean section    . Replacement total knee Bilateral   . Abdominal hysterectomy    . Cholecystectomy     Family History  Problem Relation Age of Onset  . Leukemia Mother    History  Substance Use Topics  . Smoking status: Never Smoker   . Smokeless tobacco: Not on file  . Alcohol Use: Yes     Comment: occasionally   OB History   Grav Para Term Preterm Abortions TAB SAB Ect Mult Living                 Review of Systems  Gastrointestinal: Positive for nausea, vomiting, abdominal pain and diarrhea.  Musculoskeletal: Positive for back pain.       Following pain clinic for chronic back pain    Allergies  Methotrexate derivatives and Morphine and related  Home Medications   Current Outpatient Rx  Name  Route  Sig  Dispense  Refill  . ALPRAZolam (XANAX) 0.25 MG tablet   Oral   Take 0.25 mg by mouth 3 (three) times daily as needed for anxiety.         . dicyclomine (BENTYL) 20 MG  tablet   Oral   Take 20 mg by mouth every 6 (six) hours as needed (for abdominal cramps).         . gabapentin (NEURONTIN) 600 MG tablet   Oral   Take 600 mg by mouth 3 (three) times daily.         [name] levothyroxine (SYNTHROID, LEVOTHROID) 25 MCG tablet   Oral   Take 25 mcg by mouth daily before breakfast. Take with to = total         . levothyroxine (SYNTHROID, LEVOTHROID) 300 MCG tablet   Oral   Take 300 mcg by mouth daily before breakfast. Take with a tablet to =         . Multiple Vitamin (MULTIVITAMIN WITH MINERALS) TABS tablet   Oral   Take 1 tablet by mouth daily.         . naproxen sodium (ANAPROX) 220 MG tablet   Oral   Take 220 mg by mouth daily as needed (for arthritis).         . ondansetron (ZOFRAN-ODT) 8 MG disintegrating tablet   Oral   Take 8 mg by mouth every 8 (eight) hours as needed for nausea.         [name] oxycodone (OXY-IR) 5 MG capsule  Oral   Take 5 mg by mouth every 4 (four) hours as needed for pain.          . polyethylene glycol (MIRALAX / GLYCOLAX) packet   Oral   Take 17 g by mouth daily as needed (for constipation).         . promethazine (PHENERGAN) 25 MG tablet   Oral   Take 25 mg by mouth every 6 (six) hours as needed for nausea.         . tizanidine (ZANAFLEX) 2 MG capsule   Oral   Take 4 mg by mouth 3 (three) times daily as needed for muscle spasms.           BP 151/112  Pulse 90  Temp(Src) 98 F (36.7 C) (Oral)  Resp 18  SpO2 97% Physical Exam  Nursing note and vitals reviewed. Constitutional: She appears well-developed and well-nourished.  HENT:  Head: Normocephalic and atraumatic.  Eyes: Conjunctivae are normal. Pupils are equal, round, and reactive to light.  Neck: Neck supple. No tracheal deviation present. No thyromegaly present.  Cardiovascular: Normal rate and regular rhythm.   No murmur heard. Pulmonary/Chest: Effort normal and breath sounds normal.  Abdominal: Soft.  Bowel sounds are normal. She exhibits no distension. There is no tenderness.  obese  Musculoskeletal: Normal range of motion. She exhibits no edema and no tenderness.  Neurological: She is alert. Coordination normal.  Skin: Skin is warm and dry. No rash noted.  Psychiatric: She has a normal mood and affect.    ED Course  Procedures (including critical care time) Labs Review Labs Reviewed  COMPREHENSIVE METABOLIC PANEL - Abnormal; Notable for the following:    Potassium 3.2 (*)    CO2 18 (*)    Total Protein 8.9 (*)    Alkaline Phosphatase 118 (*)    GFR calc non Af Amer 71 (*)    GFR calc Af Amer 82 (*)    All other components within normal limits  CBC WITH DIFFERENTIAL  LIPASE, BLOOD  URINALYSIS, ROUTINE W REFLEX MICROSCOPIC   Imaging Review No results found.  EKG Interpretation   None     xrays viewed by me Results for orders placed during the hospital encounter of 12/08/12  CBC WITH DIFFERENTIAL      Result Value Range   WBC 6.5  4.0 - 10.5 K/uL   RBC 4.73  3.87 - 5.11 MIL/uL   Hemoglobin 15.0  12.0 - 15.0 g/dL   HCT 16.1  09.6 - 04.5 %   MCV 90.3  78.0 - 100.0 fL   MCH 31.7  26.0 - 34.0 pg   MCHC 35.1  30.0 - 36.0 g/dL   RDW 40.9  81.1 - 91.4 %   Platelets 248  150 - 400 K/uL   Neutrophils Relative % 58  43 - 77 %   Neutro Abs 3.8  1.7 - 7.7 K/uL   Lymphocytes Relative 34  12 - 46 %   Lymphs Abs 2.2  0.7 - 4.0 K/uL   Monocytes Relative 6  3 - 12 %   Monocytes Absolute 0.4  0.1 - 1.0 K/uL   Eosinophils Relative 1  0 - 5 %   Eosinophils Absolute 0.1  0.0 - 0.7 K/uL   Basophils Relative 1  0 - 1 %   Basophils Absolute 0.0  0.0 - 0.1 K/uL  COMPREHENSIVE METABOLIC PANEL      Result Value Range   Sodium 135  135 - 145 mEq/L  Potassium 3.2 (*) 3.5 - 5.1 mEq/L   Chloride 101  96 - 112 mEq/L   CO2 18 (*) 19 - 32 mEq/L   Glucose, Bld 99  70 - 99 mg/dL   BUN 15  6 - 23 mg/dL   Creatinine, Ser 7.82  0.50 - 1.10 mg/dL   Calcium 95.6  8.4 - 21.3 mg/dL   Total  Protein 8.9 (*) 6.0 - 8.3 g/dL   Albumin 4.7  3.5 - 5.2 g/dL   AST 20  0 - 37 U/L   ALT 17  0 - 35 U/L   Alkaline Phosphatase 118 (*) 39 - 117 U/L   Total Bilirubin 0.5  0.3 - 1.2 mg/dL   GFR calc non Af Amer 71 (*) >90 mL/min   GFR calc Af Amer 82 (*) >90 mL/min  LIPASE, BLOOD      Result Value Range   Lipase 23  11 - 59 U/L  URINALYSIS, ROUTINE W REFLEX MICROSCOPIC      Result Value Range   Color, Urine AMBER (*) YELLOW   APPearance HAZY (*) CLEAR   Specific Gravity, Urine 1.029  1.005 - 1.030   pH 5.5  5.0 - 8.0   Glucose, UA NEGATIVE  NEGATIVE mg/dL   Hgb urine dipstick NEGATIVE  NEGATIVE   Bilirubin Urine SMALL (*) NEGATIVE   Ketones, ur 15 (*) NEGATIVE mg/dL   Protein, ur NEGATIVE  NEGATIVE mg/dL   Urobilinogen, UA 1.0  0.0 - 1.0 mg/dL   Nitrite NEGATIVE  NEGATIVE   Leukocytes, UA MODERATE (*) NEGATIVE  URINE MICROSCOPIC-ADD ON      Result Value Range   Squamous Epithelial / LPF FEW (*) RARE   WBC, UA 3-6  <3 WBC/hpf   Bacteria, UA FEW (*) RARE   Crystals CA OXALATE CRYSTALS (*) NEGATIVE   Urine-Other MUCOUS PRESENT     Ct Abdomen Pelvis W Contrast  11/24/2012   *RADIOLOGY REPORT*  Clinical Data: Abdominal pain.  CT ABDOMEN AND PELVIS WITH CONTRAST  Technique:  Multidetector CT imaging of the abdomen and pelvis was performed following the standard protocol during bolus administration of intravenous contrast.  Contrast: OMNIPAQUE IOHEXOL 300 MG/ML  SOLN  Comparison: None available at time of study interpretation.  Findings: Limited view of the lung bases are clear.  The included heart and pericardium are unremarkable.  The liver, spleen, pancreas, and adrenal glands are normal in size, morphology and enhancement characteristics.  Status post cholecystectomy.  The stomach, small and large bowel are normal in course and caliber without definite wall thickening or inflammatory changes though sensitivity may be decreased as contrast has yet to reach the distal colon.  A few  scattered sigmoid diverticula. Mild amount of retained large bowel stool.  Normal appendix.  No free fluid nor free air.  The kidneys are well-located, demonstrating normal morphology, size and enhancement without renal masses, nephrolithiasis or hydronephrosis.  Ureters are unremarkable.  Urinary bladder is partially distended and appears normal.  Great vessels are normal in course and caliber with mild calcific atherosclerosis.  No lymphadenopathy by CT size criteria. Internal reproductive organs are surgically absent.  The included soft tissues and osseous structures are non-suspicious. Mild levoscoliosis could be positional.  IMPRESSION: Mild amount of retained large bowel stool, no bowel obstruction. No acute intra-abdominal pelvic process.  Status post cholecystectomy, hysterectomy.   Original Report Authenticated By: Awilda Metro   Dg Abd Acute W/chest  12/08/2012   CLINICAL DATA:  Abdominal pain, vomiting, diarrhea  for 3 days, history rheumatoid arthritis, gastroparesis, fibromyalgia, lupus  EXAM: ACUTE ABDOMEN SERIES (ABDOMEN 2 VIEW & CHEST 1 VIEW)  COMPARISON:  Chest radiograph 08/31/2005  FINDINGS: Upper normal heart size.  Mediastinal contours and pulmonary vascularity normal.  Chronic peribronchial thickening.  No acute infiltrate, pleural effusion or pneumothorax.  Bones appear demineralized.  Surgical clips right upper quadrant question cholecystectomy.  Nonobstructive bowel gas pattern.  No bowel dilatation or bowel wall thickening or free intraperitoneal air.  Single surgical clips in left pelvis.  No definite urinary tract calcification.  IMPRESSION: Nonobstructive bowel gas pattern.  Mild chronic bronchitic changes.   Electronically Signed   By: Ulyses Southward M.D.   On: 12/08/2012 20:50    Patient feels improved after treatment with intramuscular hydromorphone and Reglan. She drank several ounces without difficulty. Pain is under control. MDM  No diagnosis found. Plan prescription  Reglan. She has oxycodone at home. She can take Imodium as directed for diarrhea. She is instructed to follow up with her primary care physician Dr.Grandis if she needs until she can see her gastroenterologist next month. Diagnosis #1 nausea vomiting diarrhea #2 chronic abdominal pain #3 hypokalemia    Doug Sou, MD 12/08/12 2116

## 2012-12-08 NOTE — ED Notes (Signed)
Pt c/o lower abd pain with N/V/D x 3 days; pt with chronic GI issues with flare x 3 days

## 2012-12-10 LAB — URINE CULTURE: Colony Count: 5000

## 2012-12-11 ENCOUNTER — Ambulatory Visit: Payer: Self-pay | Admitting: Pain Medicine

## 2012-12-24 ENCOUNTER — Ambulatory Visit: Payer: Medicare Other | Admitting: Family

## 2012-12-24 ENCOUNTER — Encounter: Payer: Self-pay | Admitting: Family

## 2012-12-25 ENCOUNTER — Telehealth: Payer: Self-pay | Admitting: Family

## 2012-12-25 NOTE — Telephone Encounter (Signed)
Sent letter to patient from Larina Bras PA-C

## 2013-01-05 ENCOUNTER — Emergency Department (HOSPITAL_COMMUNITY)
Admission: EM | Admit: 2013-01-05 | Discharge: 2013-01-05 | Disposition: A | Discharge status: Home / Self Care | Payer: Medicare Other | Attending: Emergency Medicine | Admitting: Emergency Medicine

## 2013-01-05 ENCOUNTER — Encounter (HOSPITAL_COMMUNITY): Payer: Self-pay | Admitting: Emergency Medicine

## 2013-01-05 DIAGNOSIS — Z9089 Acquired absence of other organs: Secondary | ICD-10-CM | POA: Insufficient documentation

## 2013-01-05 DIAGNOSIS — Z90710 Acquired absence of both cervix and uterus: Secondary | ICD-10-CM | POA: Insufficient documentation

## 2013-01-05 DIAGNOSIS — M069 Rheumatoid arthritis, unspecified: Secondary | ICD-10-CM | POA: Insufficient documentation

## 2013-01-05 DIAGNOSIS — Z3202 Encounter for pregnancy test, result negative: Secondary | ICD-10-CM | POA: Insufficient documentation

## 2013-01-05 DIAGNOSIS — G8929 Other chronic pain: Secondary | ICD-10-CM | POA: Insufficient documentation

## 2013-01-05 DIAGNOSIS — R197 Diarrhea, unspecified: Secondary | ICD-10-CM | POA: Insufficient documentation

## 2013-01-05 DIAGNOSIS — Z79899 Other long term (current) drug therapy: Secondary | ICD-10-CM | POA: Insufficient documentation

## 2013-01-05 DIAGNOSIS — Z8719 Personal history of other diseases of the digestive system: Secondary | ICD-10-CM | POA: Insufficient documentation

## 2013-01-05 DIAGNOSIS — R112 Nausea with vomiting, unspecified: Secondary | ICD-10-CM | POA: Insufficient documentation

## 2013-01-05 DIAGNOSIS — E079 Disorder of thyroid, unspecified: Secondary | ICD-10-CM | POA: Insufficient documentation

## 2013-01-05 DIAGNOSIS — R1033 Periumbilical pain: Secondary | ICD-10-CM | POA: Insufficient documentation

## 2013-01-05 LAB — COMPREHENSIVE METABOLIC PANEL
ALT: 29 U/L (ref 0–35)
Albumin: 4.5 g/dL (ref 3.5–5.2)
Alkaline Phosphatase: 126 U/L — ABNORMAL HIGH (ref 39–117)
CO2: 20 mEq/L (ref 19–32)
GFR calc Af Amer: >90 mL/min (ref >90)
GFR calc non Af Amer: 84 mL/min — ABNORMAL LOW (ref >90)
Glucose, Bld: 83 mg/dL (ref 70–99)
Potassium: 4.7 mEq/L (ref 3.5–5.1)
Sodium: 136 mEq/L (ref 135–145)

## 2013-01-05 LAB — URINE MICROSCOPIC-ADD ON

## 2013-01-05 LAB — URINALYSIS, ROUTINE W REFLEX MICROSCOPIC
Bilirubin Urine: NEGATIVE
Glucose, UA: NEGATIVE mg/dL
Hgb urine dipstick: NEGATIVE
Protein, ur: NEGATIVE mg/dL
Specific Gravity, Urine: 1.02 (ref 1.005–1.030)
Urobilinogen, UA: 0.2 mg/dL (ref 0.0–1.0)
pH: 5.5 (ref 5.0–8.0)

## 2013-01-05 LAB — CBC WITH DIFFERENTIAL/PLATELET
HCT: 46.1 % — ABNORMAL HIGH (ref 36.0–46.0)
Hemoglobin: 16 g/dL — ABNORMAL HIGH (ref 12.0–15.0)
Lymphocytes Relative: 28 % (ref 12–46)
Lymphs Abs: 1.9 10*3/uL (ref 0.7–4.0)
MCH: 31.4 pg (ref 26.0–34.0)
MCV: 90.4 fL (ref 78.0–100.0)
Monocytes Absolute: 0.3 10*3/uL (ref 0.1–1.0)
Neutrophils Relative %: 66 % (ref 43–77)
Platelets: 238 10*3/uL (ref 150–400)
RBC: 5.1 MIL/uL (ref 3.87–5.11)
WBC: 6.9 10*3/uL (ref 4.0–10.5)

## 2013-01-05 MED ORDER — HYDROMORPHONE HCL PF 1 MG/ML IJ SOLN
1.0000 mg | Freq: Once | INTRAMUSCULAR | Status: AC
  Administered 2013-01-05: 1 mg via INTRAVENOUS
  Filled 2013-01-05: qty 1

## 2013-01-05 MED ORDER — SODIUM CHLORIDE 0.9 % IV BOLUS (SEPSIS)
1000.0000 mL | Freq: Once | INTRAVENOUS | Status: AC
  Administered 2013-01-05: 1000 mL via INTRAVENOUS

## 2013-01-05 MED ORDER — ONDANSETRON HCL 4 MG/2ML IJ SOLN
4.0000 mg | Freq: Once | INTRAMUSCULAR | Status: AC
  Administered 2013-01-05: 4 mg via INTRAVENOUS
  Filled 2013-01-05: qty 2

## 2013-01-05 MED ORDER — FENTANYL CITRATE 0.05 MG/ML IJ SOLN
100.0000 ug | Freq: Once | INTRAMUSCULAR | Status: AC
  Administered 2013-01-05: 100 ug via INTRAVENOUS
  Filled 2013-01-05: qty 2

## 2013-01-05 MED ORDER — OXYCODONE HCL 5 MG PO TABS: 5.0000 mg | ORAL_TABLET | Freq: Four times a day (QID) | ORAL | Status: DC | PRN

## 2013-01-05 NOTE — ED Notes (Signed)
N/v/d on and off for a long while can go for months  And feel well and then it comes back  agin has appontment w/ GI coming up

## 2013-01-05 NOTE — ED Provider Notes (Signed)
CSN: [id number]     Arrival date & time 01/05/13  1022 History   First MD Initiated Contact with Patient 01/05/13 1135     Chief Complaint  Patient presents with  . Abdominal Pain  . Emesis   (Consider location/radiation/quality/duration/timing/severity/associated sxs/prior Treatment) HPI Comments: Patient is a 47 year old female with history of rheumatoid arthritis, gastroparesis, chronic back pain, fibromyalgia, diverticulitis, thyroid disease, lupus who presents today with nausea, vomiting, abdominal pain, diarrhea since last night. Her symptoms have gradually worsened. She feels a sharp stabbing pain below her bellybutton. Nothing makes her pain feel better. She has tried oxycodone which she has for her chronic back pain. She struggles with chronic abdominal pain and has an appointment with GI on December 17. This feels like her chronic abdominal pain. She will have flares of this pain and needs to come to the ED to have her pain controlled with IVF and pain medication. She reports that once her pain is controlled she can go on for weeks without another flare.   The history is provided by the patient. No language interpreter was used.    Past Medical History  Diagnosis Date  . Rheumatoid arthritis   . Gastroparesis   . Degenerative disc disease   . Fibromyalgia   . Diverticulitis   . Thyroid disease   . Lupus    Past Surgical History  Procedure Laterality Date  . Cesarean section    . Replacement total knee Bilateral   . Abdominal hysterectomy    . Cholecystectomy     Family History  Problem Relation Age of Onset  . Leukemia Mother    History  Substance Use Topics  . Smoking status: Never Smoker   . Smokeless tobacco: Not on file  . Alcohol Use: Yes     Comment: occasionally   OB History   Grav Para Term Preterm Abortions TAB SAB Ect Mult Living                 Review of Systems  Constitutional: Negative for fever and chills.  Respiratory: Negative for shortness  of breath.   Cardiovascular: Negative for chest pain.  Gastrointestinal: Positive for nausea, vomiting, abdominal pain and diarrhea.  Genitourinary: Negative for dysuria, urgency, frequency and difficulty urinating.  All other systems reviewed and are negative.    Allergies  Methotrexate derivatives; Morphine and related; and Relafen  Home Medications   Current Outpatient Rx  Name  Route  Sig  Dispense  Refill  . ALPRAZolam (XANAX) 0.25 MG tablet   Oral   Take 0.25 mg by mouth 3 (three) times daily as needed for anxiety.         . dicyclomine (BENTYL) 20 MG tablet   Oral   Take 20 mg by mouth every 6 (six) hours as needed (for abdominal cramps).         . gabapentin (NEURONTIN) 600 MG tablet   Oral   Take 600 mg by mouth 3 (three) times daily.         [name] HYDROcodone-acetaminophen (NORCO/VICODIN) 5-325 MG per tablet   Oral   Take 1 tablet by mouth every 6 (six) hours as needed for moderate pain.         [name] levothyroxine (SYNTHROID, LEVOTHROID) 25 MCG tablet   Oral   Take 25 mcg by mouth daily before breakfast. Take with to = total         . levothyroxine (SYNTHROID, LEVOTHROID) 300 MCG tablet   Oral  Take 300 mcg by mouth daily before breakfast. Take with a tablet to =         . metoCLOPramide (REGLAN) 10 MG tablet   Oral   Take 1 tablet (10 mg total) by mouth every 6 (six) hours as needed (nausea).   10 tablet   0   . Multiple Vitamin (MULTIVITAMIN WITH MINERALS) TABS tablet   Oral   Take 1 tablet by mouth daily.         . naproxen sodium (ANAPROX) 220 MG tablet   Oral   Take 220 mg by mouth daily as needed (for arthritis).         . ondansetron (ZOFRAN-ODT) 8 MG disintegrating tablet   Oral   Take 8 mg by mouth every 8 (eight) hours as needed for nausea.         [name] oxycodone (OXY-IR) 5 MG capsule   Oral   Take 5 mg by mouth every 4 (four) hours as needed for pain.          . promethazine (PHENERGAN) 25 MG  tablet   Oral   Take 25 mg by mouth every 6 (six) hours as needed for nausea.         . tizanidine (ZANAFLEX) 2 MG capsule   Oral   Take 4 mg by mouth 3 (three) times daily as needed for muscle spasms.           BP 122/107  Pulse 79  Temp(Src) 98.3 F (36.8 C)  Resp 18  Wt 185 lb (83.915 kg)  SpO2 100% Physical Exam  Nursing note and vitals reviewed. Constitutional: She is oriented to person, place, and time. She appears well-developed and well-nourished. No distress.  HENT:  Head: Normocephalic and atraumatic.  Right Ear: External ear normal.  Left Ear: External ear normal.  Nose: Nose normal.  Mouth/Throat: Oropharynx is clear and moist.  Eyes: Conjunctivae are normal.  Neck: Normal range of motion.  Cardiovascular: Normal rate, regular rhythm and normal heart sounds.   Pulmonary/Chest: Effort normal and breath sounds normal. No stridor. No respiratory distress. She has no wheezes. She has no rales.  Abdominal: Soft. Bowel sounds are normal. She exhibits no distension. There is tenderness in the periumbilical area and suprapubic area. There is no rigidity, no rebound and no guarding.    Musculoskeletal: Normal range of motion.  Neurological: She is alert and oriented to person, place, and time. She has normal strength.  Skin: Skin is warm and dry. She is not diaphoretic. No erythema.  Psychiatric: She has a normal mood and affect. Her behavior is normal.  tearful    ED Course  Procedures (including critical care time) Labs Review Labs Reviewed  CBC WITH DIFFERENTIAL - Abnormal; Notable for the following:    Hemoglobin 16.0 (*)    HCT 46.1 (*)    All other components within normal limits  COMPREHENSIVE METABOLIC PANEL - Abnormal; Notable for the following:    Total Protein 9.1 (*)    AST 43 (*)    Alkaline Phosphatase 126 (*)    GFR calc non Af Amer 84 (*)    All other components within normal limits  URINALYSIS, ROUTINE W REFLEX MICROSCOPIC - Abnormal;  Notable for the following:    APPearance CLOUDY (*)    Leukocytes, UA MODERATE (*)    All other components within normal limits  URINE MICROSCOPIC-ADD ON - Abnormal; Notable for the following:    Squamous Epithelial / LPF MANY (*)  Bacteria, UA MANY (*)    All other components within normal limits  URINE CULTURE  LIPASE, BLOOD  POCT PREGNANCY, URINE   Imaging Review No results found.  EKG Interpretation   None       MDM   1. Chronic abdominal pain    Patient is nontoxic, nonseptic appearing, in no apparent distress.  Patient's pain and other symptoms adequately managed in emergency department.  Fluid bolus given.  Labs and vitals reviewed. Patient had recent CT scan that was reassuring. This is the same pain, no need for repeat imaging. Patient does not meet the SIRS or Sepsis criteria.  On repeat exam patient does not have a surgical abdomen and there are no peritoneal signs.  No indication of appendicitis, bowel obstruction, bowel perforation, cholecystitis, diverticulitis, PID or ectopic pregnancy.  Patient discharged home with symptomatic treatment and given strict instructions for follow-up with their gastroenterologist.  I have also discussed reasons to return immediately to the ER.  Patient expresses understanding and agrees with plan. I discussed this case with Dr. Juleen China who agrees with plan.        Mora Bellman, PA-C 01/05/13 1720

## 2013-01-05 NOTE — ED Notes (Signed)
Discharge instructions reviewed. Pt verbalized understanding.  

## 2013-01-05 NOTE — ED Notes (Signed)
Pt c/o n/v/d that started a few days ago. Pt is being seen by gastro, was given medications to manage sx but they are not helping. Pt has been having the n/v/d for 55yrs and only thing that helps is phenergan and dilaudid. Pt states she vomited 5xs, 2 diarrhea stools in the last 24hrs.

## 2013-01-06 ENCOUNTER — Ambulatory Visit: Payer: Self-pay | Admitting: Pain Medicine

## 2013-01-06 LAB — URINE CULTURE

## 2013-01-06 NOTE — ED Provider Notes (Signed)
Medical screening examination/treatment/procedure(s) were performed by non-physician practitioner and as supervising physician I was immediately available for consultation/collaboration.  EKG Interpretation   None        Dorion Petillo, MD 01/06/13 1439 

## 2013-01-19 ENCOUNTER — Telehealth: Payer: Self-pay | Admitting: Dietician

## 2013-01-19 NOTE — Telephone Encounter (Signed)
error 

## 2013-01-21 ENCOUNTER — Ambulatory Visit: Payer: Self-pay | Admitting: Pain Medicine

## 2013-02-01 ENCOUNTER — Encounter (HOSPITAL_COMMUNITY): Payer: Self-pay | Admitting: Emergency Medicine

## 2013-02-01 ENCOUNTER — Emergency Department (HOSPITAL_COMMUNITY)
Admission: EM | Admit: 2013-02-01 | Discharge: 2013-02-01 | Disposition: A | Discharge status: Home / Self Care | Payer: Medicare Other | Attending: Emergency Medicine | Admitting: Emergency Medicine

## 2013-02-01 DIAGNOSIS — Z885 Allergy status to narcotic agent status: Secondary | ICD-10-CM | POA: Insufficient documentation

## 2013-02-01 DIAGNOSIS — E079 Disorder of thyroid, unspecified: Secondary | ICD-10-CM | POA: Insufficient documentation

## 2013-02-01 DIAGNOSIS — IMO0001 Reserved for inherently not codable concepts without codable children: Secondary | ICD-10-CM | POA: Insufficient documentation

## 2013-02-01 DIAGNOSIS — Z888 Allergy status to other drugs, medicaments and biological substances status: Secondary | ICD-10-CM | POA: Insufficient documentation

## 2013-02-01 DIAGNOSIS — M069 Rheumatoid arthritis, unspecified: Secondary | ICD-10-CM | POA: Insufficient documentation

## 2013-02-01 DIAGNOSIS — R109 Unspecified abdominal pain: Secondary | ICD-10-CM

## 2013-02-01 DIAGNOSIS — R1084 Generalized abdominal pain: Secondary | ICD-10-CM | POA: Insufficient documentation

## 2013-02-01 DIAGNOSIS — M329 Systemic lupus erythematosus, unspecified: Secondary | ICD-10-CM | POA: Insufficient documentation

## 2013-02-01 DIAGNOSIS — R110 Nausea: Secondary | ICD-10-CM

## 2013-02-01 DIAGNOSIS — R112 Nausea with vomiting, unspecified: Secondary | ICD-10-CM | POA: Insufficient documentation

## 2013-02-01 DIAGNOSIS — IMO0002 Reserved for concepts with insufficient information to code with codable children: Secondary | ICD-10-CM | POA: Insufficient documentation

## 2013-02-01 DIAGNOSIS — R197 Diarrhea, unspecified: Secondary | ICD-10-CM | POA: Insufficient documentation

## 2013-02-01 DIAGNOSIS — Z79899 Other long term (current) drug therapy: Secondary | ICD-10-CM | POA: Insufficient documentation

## 2013-02-01 DIAGNOSIS — Z8719 Personal history of other diseases of the digestive system: Secondary | ICD-10-CM | POA: Insufficient documentation

## 2013-02-01 MED ORDER — HYDROMORPHONE HCL PF 1 MG/ML IJ SOLN
1.0000 mg | Freq: Once | INTRAMUSCULAR | Status: AC
  Administered 2013-02-01: 1 mg via INTRAVENOUS
  Filled 2013-02-01: qty 1

## 2013-02-01 MED ORDER — ONDANSETRON HCL 4 MG/2ML IJ SOLN
4.0000 mg | Freq: Once | INTRAMUSCULAR | Status: AC
  Administered 2013-02-01: 4 mg via INTRAVENOUS
  Filled 2013-02-01: qty 2

## 2013-02-01 MED ORDER — ONDANSETRON 4 MG PO TBDP
4.0000 mg | ORAL_TABLET | Freq: Once | ORAL | Status: DC
  Filled 2013-02-01: qty 1

## 2013-02-01 MED ORDER — FAMOTIDINE IN NACL 20-0.9 MG/50ML-% IV SOLN
20.0000 mg | Freq: Once | INTRAVENOUS | Status: AC
  Administered 2013-02-01: 20 mg via INTRAVENOUS
  Filled 2013-02-01: qty 50

## 2013-02-01 MED ORDER — SODIUM CHLORIDE 0.9 % IV BOLUS (SEPSIS)
1000.0000 mL | Freq: Once | INTRAVENOUS | Status: AC
  Administered 2013-02-01: 1000 mL via INTRAVENOUS

## 2013-02-01 NOTE — ED Provider Notes (Signed)
CSN: [id number]     Arrival date & time 02/01/13  0036 History   First MD Initiated Contact with Patient 02/01/13 0145     Chief Complaint  Patient presents with  . Abdominal Pain   (Consider location/radiation/quality/duration/timing/severity/associated sxs/prior Treatment) HPI 47 yo woman with a history of chronic, relapsing abdominal pain, gastroparesis, RA, fibromyalgia, diverticulosis, SLE and thyroid disease.   Patient presents with complaints of diffuse abdominal pain with nausea, vomiting and diarrhea for the past 24 hrs. Patient has vomited NBNB x 2 and has had 2 episodes of nonbloody diarrhea. No fever. Pain is stabbing, cramping and diffuse - mainly in the lower abdomen. Patient notes multiple previous episodes of similar sx. She is in the process of work up with a gastroenterologist. The patient has tried taking Reglan, Zofran and Phenergan without adequate pain relief. Her pain is currently 8/10.   Patient notes that similar episodes are often triggered by emotional stress. She states that her 12 week old grandson is currently intubated in the PICU at [facility] for treatment of respiratory failure secondary to RSV and she has been taking care of her other 3 grandchildren.   Past Medical History  Diagnosis Date  . Rheumatoid arthritis   . Gastroparesis   . Degenerative disc disease   . Fibromyalgia   . Diverticulitis   . Thyroid disease   . Lupus    Past Surgical History  Procedure Laterality Date  . Cesarean section    . Replacement total knee Bilateral   . Abdominal hysterectomy    . Cholecystectomy     Family History  Problem Relation Age of Onset  . Leukemia Mother    History  Substance Use Topics  . Smoking status: Never Smoker   . Smokeless tobacco: Not on file  . Alcohol Use: Yes     Comment: occasionally   OB History   Grav Para Term Preterm Abortions TAB SAB Ect Mult Living                 Review of Systems Ten point review of symptoms performed and is  negative with the exception of symptoms noted above.   Allergies  Methotrexate derivatives; Morphine and related; and Relafen  Home Medications   Current Outpatient Rx  Name  Route  Sig  Dispense  Refill  . dicyclomine (BENTYL) 20 MG tablet   Oral   Take 20 mg by mouth every 6 (six) hours as needed (for abdominal cramps).         . gabapentin (NEURONTIN) 600 MG tablet   Oral   Take 600 mg by mouth 3 (three) times daily.         [name] levothyroxine (SYNTHROID, LEVOTHROID) 25 MCG tablet   Oral   Take 25 mcg by mouth daily before breakfast. Take with to = total         . levothyroxine (SYNTHROID, LEVOTHROID) 300 MCG tablet   Oral   Take 300 mcg by mouth daily before breakfast. Take with a tablet to =         . metoCLOPramide (REGLAN) 10 MG tablet   Oral   Take 1 tablet (10 mg total) by mouth every 6 (six) hours as needed (nausea).   10 tablet   0   . ondansetron (ZOFRAN-ODT) 8 MG disintegrating tablet   Oral   Take 8 mg by mouth every 8 (eight) hours as needed for nausea.         [name]  oxyCODONE (ROXICODONE) 5 MG immediate release tablet   Oral   Take 1 tablet (5 mg total) by mouth every 6 (six) hours as needed for severe pain.   12 tablet   0   . promethazine (PHENERGAN) 25 MG tablet   Oral   Take 25 mg by mouth every 6 (six) hours as needed for nausea.         . tizanidine (ZANAFLEX) 2 MG capsule   Oral   Take 4 mg by mouth 3 (three) times daily as needed for muscle spasms.           BP 134/95  Pulse 86  Temp(Src) 97.9 F (36.6 C) (Oral)  Resp 22  SpO2 99% Physical Exam Gen: well developed and well nourished appearing Head: NCAT Eyes: PERL, EOMI Nose: no epistaixis or rhinorrhea Mouth/throat: mucosa is moist and pink Neck: supple, no stridor Lungs: CTA B, no wheezing, rhonchi or rales Abd: soft, very mildly and diffusely tender, nondistended, no peritoneal signs. obese Back: no ttp, no cva ttp Skin: no rashese,  wnl Neuro: CN ii-xii grossly intact, no focal deficits Psyche; normal affect,  calm and cooperative.  ED Course  Procedures (including critical care time) Labs Review   MDM  Patient with excacerbation of chronic, recurrent abdominal pain. Feeling better after symptomatic management in the ED and tolerating po intake. VS and exam are reassuring. Patient is stable for discharge. She has several prn antiemetics at home and may follow up with her PCP tomorrow.     Brandt Loosen, MD 02/01/13 [id number]

## 2013-02-01 NOTE — ED Notes (Signed)
Unable to give urine specimen at triage.  

## 2013-02-01 NOTE — ED Notes (Signed)
Pt. reports low abdominal pain with nausea , vomitting and diarrhea onset 2 days ago , denies urinary discomfort . No fever or chills.

## 2013-02-02 ENCOUNTER — Ambulatory Visit: Payer: Self-pay | Admitting: Pain Medicine

## 2013-02-12 DIAGNOSIS — K529 Noninfective gastroenteritis and colitis, unspecified: Secondary | ICD-10-CM

## 2013-02-12 DIAGNOSIS — R1115 Cyclical vomiting syndrome unrelated to migraine: Secondary | ICD-10-CM

## 2013-02-12 HISTORY — DX: Cyclical vomiting syndrome unrelated to migraine: R11.15

## 2013-02-12 HISTORY — DX: Noninfective gastroenteritis and colitis, unspecified: K52.9

## 2013-02-22 ENCOUNTER — Emergency Department: Payer: Self-pay | Admitting: Emergency Medicine

## 2013-02-22 LAB — URINALYSIS, COMPLETE
BILIRUBIN, UR: NEGATIVE
BLOOD: NEGATIVE
GLUCOSE, UR: NEGATIVE mg/dL (ref 0–75)
KETONE: NEGATIVE
LEUKOCYTE ESTERASE: NEGATIVE
Nitrite: NEGATIVE
Ph: 5 (ref 4.5–8.0)
Protein: NEGATIVE
RBC,UR: 1 /HPF (ref 0–5)
SPECIFIC GRAVITY: 1.01 (ref 1.003–1.030)
Squamous Epithelial: 4
WBC UR: 1 /HPF (ref 0–5)

## 2013-02-22 LAB — COMPREHENSIVE METABOLIC PANEL
ALK PHOS: 97 U/L
ALT: 55 U/L (ref 12–78)
ANION GAP: 7 (ref 7–16)
AST: 55 U/L — AB (ref 15–37)
Albumin: 4.4 g/dL (ref 3.4–5.0)
BUN: 12 mg/dL (ref 7–18)
Bilirubin,Total: 0.7 mg/dL (ref 0.2–1.0)
CREATININE: 1.42 mg/dL — AB (ref 0.60–1.30)
Calcium, Total: 9.5 mg/dL (ref 8.5–10.1)
Chloride: 102 mmol/L (ref 98–107)
Co2: 24 mmol/L (ref 21–32)
EGFR (Non-African Amer.): 43 — ABNORMAL LOW
GFR CALC AF AMER: 50 — AB
Glucose: 90 mg/dL (ref 65–99)
Osmolality: 266 (ref 275–301)
Potassium: 3.5 mmol/L (ref 3.5–5.1)
Sodium: 133 mmol/L — ABNORMAL LOW (ref 136–145)
Total Protein: 9 g/dL — ABNORMAL HIGH (ref 6.4–8.2)

## 2013-02-22 LAB — CBC WITH DIFFERENTIAL/PLATELET
BASOS PCT: 0.2 %
Basophil #: 0 10*3/uL (ref 0.0–0.1)
EOS ABS: 0.2 10*3/uL (ref 0.0–0.7)
Eosinophil %: 3.1 %
HCT: 40.3 % (ref 35.0–47.0)
HGB: 14 g/dL (ref 12.0–16.0)
LYMPHS ABS: 3 10*3/uL (ref 1.0–3.6)
Lymphocyte %: 49.3 %
MCH: 30.5 pg (ref 26.0–34.0)
MCHC: 34.7 g/dL (ref 32.0–36.0)
MCV: 88 fL (ref 80–100)
Monocyte #: 0.3 x10 3/mm (ref 0.2–0.9)
Monocyte %: 5.4 %
Neutrophil #: 2.6 10*3/uL (ref 1.4–6.5)
Neutrophil %: 42 %
PLATELETS: 212 10*3/uL (ref 150–440)
RBC: 4.58 10*6/uL (ref 3.80–5.20)
RDW: 16.1 % — ABNORMAL HIGH (ref 11.5–14.5)
WBC: 6.2 10*3/uL (ref 3.6–11.0)

## 2013-02-22 LAB — LIPASE, BLOOD: Lipase: 76 U/L (ref 73–393)

## 2013-03-02 ENCOUNTER — Ambulatory Visit: Payer: Self-pay | Admitting: Pain Medicine

## 2013-03-15 ENCOUNTER — Encounter (HOSPITAL_COMMUNITY): Payer: Self-pay | Admitting: Emergency Medicine

## 2013-03-15 ENCOUNTER — Emergency Department (HOSPITAL_COMMUNITY)
Admission: EM | Admit: 2013-03-15 | Discharge: 2013-03-15 | Disposition: A | Discharge status: Home / Self Care | Payer: Medicare Other | Attending: Emergency Medicine | Admitting: Emergency Medicine

## 2013-03-15 DIAGNOSIS — E079 Disorder of thyroid, unspecified: Secondary | ICD-10-CM | POA: Insufficient documentation

## 2013-03-15 DIAGNOSIS — R109 Unspecified abdominal pain: Secondary | ICD-10-CM

## 2013-03-15 DIAGNOSIS — IMO0001 Reserved for inherently not codable concepts without codable children: Secondary | ICD-10-CM | POA: Insufficient documentation

## 2013-03-15 DIAGNOSIS — Z79899 Other long term (current) drug therapy: Secondary | ICD-10-CM | POA: Insufficient documentation

## 2013-03-15 DIAGNOSIS — Z8719 Personal history of other diseases of the digestive system: Secondary | ICD-10-CM | POA: Insufficient documentation

## 2013-03-15 DIAGNOSIS — M549 Dorsalgia, unspecified: Secondary | ICD-10-CM | POA: Insufficient documentation

## 2013-03-15 DIAGNOSIS — G8929 Other chronic pain: Secondary | ICD-10-CM | POA: Insufficient documentation

## 2013-03-15 DIAGNOSIS — R112 Nausea with vomiting, unspecified: Secondary | ICD-10-CM | POA: Insufficient documentation

## 2013-03-15 DIAGNOSIS — R197 Diarrhea, unspecified: Secondary | ICD-10-CM | POA: Insufficient documentation

## 2013-03-15 DIAGNOSIS — M069 Rheumatoid arthritis, unspecified: Secondary | ICD-10-CM | POA: Insufficient documentation

## 2013-03-15 DIAGNOSIS — R1032 Left lower quadrant pain: Secondary | ICD-10-CM | POA: Insufficient documentation

## 2013-03-15 HISTORY — DX: Unspecified porphyria: E80.20

## 2013-03-15 LAB — URINE MICROSCOPIC-ADD ON

## 2013-03-15 LAB — LIPASE, BLOOD: Lipase: 17 U/L (ref 11–59)

## 2013-03-15 LAB — CBC WITH DIFFERENTIAL/PLATELET
Basophils Absolute: 0 10*3/uL (ref 0.0–0.1)
Basophils Relative: 1 % (ref 0–1)
Eosinophils Absolute: 0.1 10*3/uL (ref 0.0–0.7)
Eosinophils Relative: 1 % (ref 0–5)
HEMATOCRIT: 48.6 % — AB (ref 36.0–46.0)
HEMOGLOBIN: 16.8 g/dL — AB (ref 12.0–15.0)
LYMPHS ABS: 1.7 10*3/uL (ref 0.7–4.0)
LYMPHS PCT: 33 % (ref 12–46)
MCH: 30.7 pg (ref 26.0–34.0)
MCHC: 34.6 g/dL (ref 30.0–36.0)
MCV: 88.7 fL (ref 78.0–100.0)
MONOS PCT: 6 % (ref 3–12)
Monocytes Absolute: 0.3 10*3/uL (ref 0.1–1.0)
Neutro Abs: 3.2 10*3/uL (ref 1.7–7.7)
Neutrophils Relative %: 60 % (ref 43–77)
Platelets: 178 10*3/uL (ref 150–400)
RBC: 5.48 MIL/uL — ABNORMAL HIGH (ref 3.87–5.11)
RDW: 15.9 % — ABNORMAL HIGH (ref 11.5–15.5)
WBC: 5.3 10*3/uL (ref 4.0–10.5)

## 2013-03-15 LAB — COMPREHENSIVE METABOLIC PANEL
ALT: 78 U/L — AB (ref 0–35)
AST: 107 U/L — ABNORMAL HIGH (ref 0–37)
Albumin: 4.6 g/dL (ref 3.5–5.2)
Alkaline Phosphatase: 113 U/L (ref 39–117)
BUN: 14 mg/dL (ref 6–23)
CALCIUM: 9.9 mg/dL (ref 8.4–10.5)
CO2: 18 mEq/L — ABNORMAL LOW (ref 19–32)
Chloride: 98 mEq/L (ref 96–112)
Creatinine, Ser: 1.21 mg/dL — ABNORMAL HIGH (ref 0.50–1.10)
GFR, EST AFRICAN AMERICAN: 60 mL/min — AB (ref >90)
GFR, EST NON AFRICAN AMERICAN: 52 mL/min — AB (ref >90)
GLUCOSE: 84 mg/dL (ref 70–99)
Potassium: 4.9 mEq/L (ref 3.7–5.3)
SODIUM: 138 meq/L (ref 137–147)
Total Bilirubin: 0.8 mg/dL (ref 0.3–1.2)
Total Protein: 9.1 g/dL — ABNORMAL HIGH (ref 6.0–8.3)

## 2013-03-15 LAB — URINALYSIS, ROUTINE W REFLEX MICROSCOPIC
BILIRUBIN URINE: NEGATIVE
Glucose, UA: NEGATIVE mg/dL
Hgb urine dipstick: NEGATIVE
Ketones, ur: NEGATIVE mg/dL
NITRITE: NEGATIVE
Protein, ur: NEGATIVE mg/dL
SPECIFIC GRAVITY, URINE: 1.008 (ref 1.005–1.030)
UROBILINOGEN UA: 1 mg/dL (ref 0.0–1.0)
pH: 7.5 (ref 5.0–8.0)

## 2013-03-15 MED ORDER — ONDANSETRON HCL 4 MG/2ML IJ SOLN
4.0000 mg | Freq: Once | INTRAMUSCULAR | Status: AC
Start: 2013-03-15 — End: 2013-03-15
  Administered 2013-03-15: 4 mg via INTRAVENOUS
  Filled 2013-03-15: qty 2

## 2013-03-15 MED ORDER — HYDROMORPHONE HCL PF 1 MG/ML IJ SOLN
1.0000 mg | Freq: Once | INTRAMUSCULAR | Status: AC
  Administered 2013-03-15: 1 mg via INTRAVENOUS
  Filled 2013-03-15: qty 1

## 2013-03-15 MED ORDER — ONDANSETRON HCL 4 MG/2ML IJ SOLN
4.0000 mg | Freq: Once | INTRAMUSCULAR | Status: AC
  Administered 2013-03-15: 4 mg via INTRAVENOUS
  Filled 2013-03-15: qty 2

## 2013-03-15 MED ORDER — SODIUM CHLORIDE 0.9 % IV BOLUS (SEPSIS)
1000.0000 mL | Freq: Once | INTRAVENOUS | Status: AC
  Administered 2013-03-15: 1000 mL via INTRAVENOUS

## 2013-03-15 MED ORDER — HYDROMORPHONE HCL PF 1 MG/ML IJ SOLN
2.0000 mg | Freq: Once | INTRAMUSCULAR | Status: AC
  Administered 2013-03-15: 2 mg via INTRAVENOUS
  Filled 2013-03-15 (×2): qty 2

## 2013-03-15 MED ORDER — HYDROMORPHONE HCL PF 1 MG/ML IJ SOLN
2.0000 mg | Freq: Once | INTRAMUSCULAR | Status: AC
  Administered 2013-03-15: 2 mg via INTRAVENOUS
  Filled 2013-03-15: qty 2

## 2013-03-15 NOTE — Discharge Instructions (Signed)

## 2013-03-15 NOTE — ED Notes (Signed)
C/o n/v/d onset this am. 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 She is trying to get a GI appt for management of symptoms but has been unable to.

## 2013-03-15 NOTE — ED Provider Notes (Signed)
9:01 PM Patient reports pain and nausea are returning, plan to dose medication again and continue to monitor.  10:19 PM Reports pain better under control - she thinks she may need one more dose of pain medication and she will be able to decide if she can go home or not.  Labs back, urine normal, CBC without leukocytosis, Hgb normal, Cr- elevated at 1.21, above baseline at 1.0,  Transaminases elevated as well.  Will try po challenge.  10:46 PM I do not feel that the patient will need to be admitted, there has been no vomiting here, will repeat her pain medication and then discharge her home.  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  Her levels in 2007 were negative.  I believe this to be more chronic pain and will not be prescribing pain medication to go home with.  She is to follow up with GI here.    11:14 PM Patient discharged home, no vomiting noted here.  Able to take in po's, continues to complain of 4/10 abdominal pain.  Idalia Needle Joelyn Oms, PA-C 03/15/13 2314

## 2013-03-15 NOTE — ED Provider Notes (Signed)
CSN: [id number]     Arrival date & time 03/15/13  47 History   First MD Initiated Contact with Patient 03/15/13 1801     Chief Complaint  Patient presents with  . Abdominal Pain   (Consider location/radiation/quality/duration/timing/severity/associated sxs/prior Treatment) HPI Comments: 48 year old female presents with recurrent abdominal pain. 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 She states that her pain is a sharp stabbing pain in her left lower quadrant. She rates the pain is an 8 or 9/10. She's been having vomiting and diarrhea without blood. She states she took her oxycodone last night that she normally has for back pain but it did not help. Today she been unable to take any medicines due to multiple episodes of vomiting. She states this pain is recurrent for her. She has had diverticulitis in the past but states is not like this. Is more consistent with her chronic abdominal pain. She states normally she has to get IV fluids and she has been so dehydrated she has had to be admitted before. Of note patient states she had a GI followup in December but her grandchild got very sick and had to be intubated at an outside hospital and so she missed that appointment. She has not set up a repeat appointment.   Past Medical History  Diagnosis Date  . Rheumatoid arthritis   . Gastroparesis   . Degenerative disc disease   . Fibromyalgia   . Diverticulitis   . Thyroid disease   . Lupus   . Porphyria    Past Surgical History  Procedure Laterality Date  . Cesarean section    . Replacement total knee Bilateral   . Abdominal hysterectomy    . Cholecystectomy     Family History  Problem Relation Age of Onset  . Leukemia Mother    History  Substance Use Topics  . Smoking status: Never Smoker   . Smokeless tobacco: Not on file  . Alcohol Use: Yes     Comment: occasionally   OB History   Grav Para Term  Preterm Abortions TAB SAB Ect Mult Living                 Review of Systems  Constitutional: Negative for fever and chills.  Respiratory: Negative for shortness of breath.   Cardiovascular: Negative for chest pain.  Gastrointestinal: Positive for nausea, vomiting, abdominal pain and diarrhea. Negative for constipation and blood in stool.  Genitourinary: Negative for dysuria.  Musculoskeletal: Positive for back pain (chronic back pain).  All other systems reviewed and are negative.    Allergies  Methotrexate derivatives; Morphine and related; and Relafen  Home Medications   Current Outpatient Rx  Name  Route  Sig  Dispense  Refill  . dicyclomine (BENTYL) 20 MG tablet   Oral   Take 20 mg by mouth every 6 (six) hours as needed (for abdominal cramps).         . gabapentin (NEURONTIN) 600 MG tablet   Oral   Take 600 mg by mouth 3 (three) times daily.         [name] levothyroxine (SYNTHROID, LEVOTHROID) 25 MCG tablet   Oral   Take 25 mcg by mouth daily before breakfast. Take with 338mcg to = 35mcg total         . levothyroxine (SYNTHROID, LEVOTHROID) 300 MCG tablet   Oral   Take 300 mcg by mouth daily before breakfast. Take  with a 53mcg tablet to = 345mcg         . metoCLOPramide (REGLAN) 10 MG tablet   Oral   Take 1 tablet (10 mg total) by mouth every 6 (six) hours as needed (nausea).   10 tablet   0   . ondansetron (ZOFRAN-ODT) 8 MG disintegrating tablet   Oral   Take 8 mg by mouth every 8 (eight) hours as needed for nausea.         [name] oxyCODONE (ROXICODONE) 5 MG immediate release tablet   Oral   Take 1 tablet (5 mg total) by mouth every 6 (six) hours as needed for severe pain.   12 tablet   0   . promethazine (PHENERGAN) 25 MG tablet   Oral   Take 25 mg by mouth every 6 (six) hours as needed for nausea.         . tizanidine (ZANAFLEX) 2 MG capsule   Oral   Take 4 mg by mouth 3 (three) times daily as needed for muscle spasms.           BP 186/155   Pulse 79  Temp(Src) 97.6 F (36.4 C) (Oral)  Resp 18  Ht 5\' 1"  (1.549 m)  Wt 187 lb (84.823 kg)  BMI 35.35 kg/m2  SpO2 98% Physical Exam  Nursing note and vitals reviewed. Constitutional: She is oriented to person, place, and time. She appears well-developed and well-nourished.  HENT:  Head: Normocephalic and atraumatic.  Right Ear: External ear normal.  Left Ear: External ear normal.  Nose: Nose normal.  Eyes: Right eye exhibits no discharge. Left eye exhibits no discharge.  Cardiovascular: Normal rate, regular rhythm and normal heart sounds.   Pulmonary/Chest: Effort normal and breath sounds normal.  Abdominal: Soft. There is tenderness in the left lower quadrant.  Neurological: She is alert and oriented to person, place, and time.  Skin: Skin is warm and dry.    ED Course  Procedures (including critical care time) Labs Review Labs Reviewed  CBC WITH DIFFERENTIAL  COMPREHENSIVE METABOLIC PANEL  URINALYSIS, ROUTINE W REFLEX MICROSCOPIC  LIPASE, BLOOD   Imaging Review No results found.  EKG Interpretation   None       MDM   1. Chronic abdominal pain    Patient's abd exam is soft with no guarding with point tenderness in LLQ. She states this is like her recurrent abd pain that is not diverticulitis. Given this, w/o fever or other concerning signs I feel she does not need a CT at this time. Will treat symptomatically and given fluids and anti-emetics. Care transferred with labs and final dispo pending.     Ephraim Hamburger, MD 03/15/13 2109

## 2013-03-17 LAB — URINE CULTURE: Colony Count: 5000

## 2013-03-19 ENCOUNTER — Emergency Department (HOSPITAL_COMMUNITY)
Admission: EM | Admit: 2013-03-19 | Discharge: 2013-03-19 | Disposition: A | Discharge status: Home / Self Care | Payer: Medicare Other | Attending: Emergency Medicine | Admitting: Emergency Medicine

## 2013-03-19 ENCOUNTER — Encounter (HOSPITAL_COMMUNITY): Payer: Self-pay | Admitting: Emergency Medicine

## 2013-03-19 ENCOUNTER — Emergency Department (HOSPITAL_COMMUNITY): Payer: Medicare Other

## 2013-03-19 DIAGNOSIS — R112 Nausea with vomiting, unspecified: Secondary | ICD-10-CM | POA: Insufficient documentation

## 2013-03-19 DIAGNOSIS — G8929 Other chronic pain: Secondary | ICD-10-CM | POA: Insufficient documentation

## 2013-03-19 DIAGNOSIS — E079 Disorder of thyroid, unspecified: Secondary | ICD-10-CM | POA: Insufficient documentation

## 2013-03-19 DIAGNOSIS — R1032 Left lower quadrant pain: Secondary | ICD-10-CM | POA: Insufficient documentation

## 2013-03-19 DIAGNOSIS — Z79899 Other long term (current) drug therapy: Secondary | ICD-10-CM | POA: Insufficient documentation

## 2013-03-19 DIAGNOSIS — Z8719 Personal history of other diseases of the digestive system: Secondary | ICD-10-CM | POA: Insufficient documentation

## 2013-03-19 DIAGNOSIS — R109 Unspecified abdominal pain: Secondary | ICD-10-CM

## 2013-03-19 DIAGNOSIS — R197 Diarrhea, unspecified: Secondary | ICD-10-CM | POA: Insufficient documentation

## 2013-03-19 DIAGNOSIS — Z90710 Acquired absence of both cervix and uterus: Secondary | ICD-10-CM | POA: Insufficient documentation

## 2013-03-19 DIAGNOSIS — Z9089 Acquired absence of other organs: Secondary | ICD-10-CM | POA: Insufficient documentation

## 2013-03-19 DIAGNOSIS — Z8739 Personal history of other diseases of the musculoskeletal system and connective tissue: Secondary | ICD-10-CM | POA: Insufficient documentation

## 2013-03-19 LAB — CBC WITH DIFFERENTIAL/PLATELET
Basophils Absolute: 0 10*3/uL (ref 0.0–0.1)
Basophils Relative: 1 % (ref 0–1)
Eosinophils Absolute: 0.1 10*3/uL (ref 0.0–0.7)
Eosinophils Relative: 3 % (ref 0–5)
HCT: 39.2 % (ref 36.0–46.0)
Hemoglobin: 13.3 g/dL (ref 12.0–15.0)
Lymphocytes Relative: 29 % (ref 12–46)
Lymphs Abs: 1.6 10*3/uL (ref 0.7–4.0)
MCH: 30.4 pg (ref 26.0–34.0)
MCHC: 33.9 g/dL (ref 30.0–36.0)
MCV: 89.5 fL (ref 78.0–100.0)
Monocytes Absolute: 0.3 10*3/uL (ref 0.1–1.0)
Monocytes Relative: 6 % (ref 3–12)
Neutro Abs: 3.4 10*3/uL (ref 1.7–7.7)
Neutrophils Relative %: 62 % (ref 43–77)
Platelets: 173 10*3/uL (ref 150–400)
RBC: 4.38 MIL/uL (ref 3.87–5.11)
RDW: 15.7 % — ABNORMAL HIGH (ref 11.5–15.5)
WBC: 5.5 10*3/uL (ref 4.0–10.5)

## 2013-03-19 LAB — COMPREHENSIVE METABOLIC PANEL
ALT: 43 U/L — AB (ref 0–35)
AST: 42 U/L — AB (ref 0–37)
Albumin: 4 g/dL (ref 3.5–5.2)
Alkaline Phosphatase: 100 U/L (ref 39–117)
BILIRUBIN TOTAL: 0.4 mg/dL (ref 0.3–1.2)
BUN: 9 mg/dL (ref 6–23)
CHLORIDE: 103 meq/L (ref 96–112)
CO2: 22 meq/L (ref 19–32)
Calcium: 9.5 mg/dL (ref 8.4–10.5)
Creatinine, Ser: 1.37 mg/dL — ABNORMAL HIGH (ref 0.50–1.10)
GFR calc Af Amer: 52 mL/min — ABNORMAL LOW (ref >90)
GFR calc non Af Amer: 44 mL/min — ABNORMAL LOW (ref >90)
Glucose, Bld: 95 mg/dL (ref 70–99)
POTASSIUM: 3.7 meq/L (ref 3.7–5.3)
SODIUM: 140 meq/L (ref 137–147)
Total Protein: 8 g/dL (ref 6.0–8.3)

## 2013-03-19 LAB — URINALYSIS, ROUTINE W REFLEX MICROSCOPIC
Bilirubin Urine: NEGATIVE
GLUCOSE, UA: NEGATIVE mg/dL
HGB URINE DIPSTICK: NEGATIVE
KETONES UR: NEGATIVE mg/dL
Nitrite: NEGATIVE
PROTEIN: NEGATIVE mg/dL
Specific Gravity, Urine: 1.014 (ref 1.005–1.030)
Urobilinogen, UA: 1 mg/dL (ref 0.0–1.0)
pH: 6.5 (ref 5.0–8.0)

## 2013-03-19 LAB — URINE MICROSCOPIC-ADD ON

## 2013-03-19 LAB — LIPASE, BLOOD: Lipase: 29 U/L (ref 11–59)

## 2013-03-19 MED ORDER — PROMETHAZINE HCL 25 MG RE SUPP: 25.0000 mg | Freq: Four times a day (QID) | RECTAL | Status: DC | PRN

## 2013-03-19 MED ORDER — ONDANSETRON HCL 4 MG/2ML IJ SOLN
4.0000 mg | Freq: Once | INTRAMUSCULAR | Status: AC
Start: 2013-03-19 — End: 2013-03-19
  Administered 2013-03-19: 4 mg via INTRAVENOUS
  Filled 2013-03-19: qty 2

## 2013-03-19 MED ORDER — HYDROMORPHONE HCL PF 1 MG/ML IJ SOLN
1.0000 mg | Freq: Once | INTRAMUSCULAR | Status: AC
  Administered 2013-03-19: 1 mg via INTRAVENOUS
  Filled 2013-03-19: qty 1

## 2013-03-19 MED ORDER — IOHEXOL 300 MG/ML  SOLN: 25.0000 mL | INTRAMUSCULAR | Status: AC

## 2013-03-19 MED ORDER — OMEPRAZOLE 20 MG PO CPDR: 20.0000 mg | DELAYED_RELEASE_CAPSULE | Freq: Two times a day (BID) | ORAL | Status: DC

## 2013-03-19 MED ORDER — ONDANSETRON 4 MG PO TBDP: 4.0000 mg | ORAL_TABLET | Freq: Three times a day (TID) | ORAL | Status: DC | PRN

## 2013-03-19 MED ORDER — ONDANSETRON HCL 4 MG/2ML IJ SOLN
4.0000 mg | Freq: Once | INTRAMUSCULAR | Status: AC
  Administered 2013-03-19: 4 mg via INTRAVENOUS
  Filled 2013-03-19: qty 2

## 2013-03-19 MED ORDER — SODIUM CHLORIDE 0.9 % IV BOLUS (SEPSIS)
1000.0000 mL | Freq: Once | INTRAVENOUS | Status: AC
  Administered 2013-03-19: 1000 mL via INTRAVENOUS

## 2013-03-19 MED ORDER — DICYCLOMINE HCL 20 MG PO TABS: 20.0000 mg | ORAL_TABLET | Freq: Two times a day (BID) | ORAL | Status: DC

## 2013-03-19 NOTE — ED Notes (Signed)
Per Dr. Vanita Panda, it is ok to perform CT abdomen without IV contrast

## 2013-03-19 NOTE — ED Notes (Signed)
Patient transported to CT 

## 2013-03-19 NOTE — ED Provider Notes (Signed)
Medical screening examination/treatment/procedure(s) were performed by non-physician practitioner and as supervising physician I was immediately available for consultation/collaboration.  EKG Interpretation   None       Rolland Porter, MD, Abram Sander   Janice Norrie, MD 03/19/13 [phone]

## 2013-03-19 NOTE — Discharge Instructions (Signed)
Keep your appointe ment with [facility] on 03/24/2013 Call for a follow up appointment with a Family or Primary Care Provider.  Return if Symptoms worsen.   Take medication as prescribed.  Rest your bowels from solid food.  Try and drink clear liquids for the first day or two.  If you don't have vomiting slowly add in food from the Diarrhea diet (attached).  If you do have vomiting go back to the bowel rest and slowly advance diet as tolerated. Try and eat small frequent meals. Avoid food that increases discomfort, including, caffeine, chocolate, alcohol, NSADIs, and peppermint.

## 2013-03-19 NOTE — ED Provider Notes (Signed)
CSN: [id number]     Arrival date & time 03/19/13  1211 History   First MD Initiated Contact with Patient 03/19/13 1301     Chief Complaint  Patient presents with  . Emesis  . Diarrhea  . Abdominal Pain   (Consider location/radiation/quality/duration/timing/severity/associated sxs/prior Treatment) HPI Comments: As the emergency department with chief complaints of abdominal pain. She states that she has been having chronic abdominal pain, but that it has recently worsened. She was seen here on Sunday for the same. He states that the pain is still severe, and that now she is having a lot of watery diarrhea. She also endorses nausea and vomiting. She denies hematemesis, or melena. She states that she has appointment with GI on February 10, but says that she could not wait because her symptoms have worsened so much. She has a history of gastroparesis and diverticulitis.  The history is provided by the patient. No language interpreter was used.    Past Medical History  Diagnosis Date  . Rheumatoid arthritis   . Gastroparesis   . Degenerative disc disease   . Fibromyalgia   . Diverticulitis   . Thyroid disease   . Lupus   . Porphyria    Past Surgical History  Procedure Laterality Date  . Cesarean section    . Replacement total knee Bilateral   . Abdominal hysterectomy    . Cholecystectomy     Family History  Problem Relation Age of Onset  . Leukemia Mother    History  Substance Use Topics  . Smoking status: Never Smoker   . Smokeless tobacco: Not on file  . Alcohol Use: Yes     Comment: occasionally   OB History   Grav Para Term Preterm Abortions TAB SAB Ect Mult Living                 Review of Systems  All other systems reviewed and are negative.    Allergies  Methotrexate derivatives; Morphine and related; and Relafen  Home Medications   Current Outpatient Rx  Name  Route  Sig  Dispense  Refill  . dicyclomine (BENTYL) 20 MG tablet   Oral   Take 20 mg by mouth  every 6 (six) hours as needed (for abdominal cramps).         . gabapentin (NEURONTIN) 600 MG tablet   Oral   Take 600 mg by mouth 3 (three) times daily.         [name] levothyroxine (SYNTHROID, LEVOTHROID) 25 MCG tablet   Oral   Take 25 mcg by mouth daily before breakfast. Take with 334mcg to = 352mcg total         . levothyroxine (SYNTHROID, LEVOTHROID) 300 MCG tablet   Oral   Take 300 mcg by mouth daily before breakfast. Take with a 77mcg tablet to = 346mcg         . metoCLOPramide (REGLAN) 10 MG tablet   Oral   Take 1 tablet (10 mg total) by mouth every 6 (six) hours as needed (nausea).   10 tablet   0   . ondansetron (ZOFRAN-ODT) 8 MG disintegrating tablet   Oral   Take 8 mg by mouth every 8 (eight) hours as needed for nausea.         [name] oxyCODONE (ROXICODONE) 5 MG immediate release tablet   Oral   Take 1 tablet (5 mg total) by mouth every 6 (six) hours as needed for severe pain.   12 tablet   0   .  promethazine (PHENERGAN) 25 MG tablet   Oral   Take 25 mg by mouth every 6 (six) hours as needed for nausea.         . tizanidine (ZANAFLEX) 2 MG capsule   Oral   Take 4 mg by mouth 3 (three) times daily as needed for muscle spasms.           BP 112/83  Pulse 69  Temp(Src) 97.8 F (36.6 C) (Oral)  Resp 17  Ht 5\' 1"  (1.549 m)  Wt 188 lb (85.276 kg)  BMI 35.54 kg/m2  SpO2 98% Physical Exam  Nursing note and vitals reviewed. Constitutional: She is oriented to person, place, and time. She appears well-developed and well-nourished.  HENT:  Head: Normocephalic and atraumatic.  Eyes: Conjunctivae and EOM are normal. Pupils are equal, round, and reactive to light.  Neck: Normal range of motion. Neck supple.  Cardiovascular: Normal rate and regular rhythm.  Exam reveals no gallop and no friction rub.   No murmur heard. Pulmonary/Chest: Effort normal and breath sounds normal. No respiratory distress. She has no wheezes. She has no rales. She exhibits no  tenderness.  Abdominal: Soft. Bowel sounds are normal. She exhibits no distension and no mass. There is no tenderness. There is no rebound and no guarding.  Abdomen is diffusely uncomfortable, more tenderness to palpation the left lower quadrant than anywhere else, no fluid wave, or signs of peritonitis  Musculoskeletal: Normal range of motion. She exhibits no edema and no tenderness.  Neurological: She is alert and oriented to person, place, and time.  Skin: Skin is warm and dry.  Psychiatric: She has a normal mood and affect. Her behavior is normal. Judgment and thought content normal.    ED Course  Procedures (including critical care time) Results for orders placed during the hospital encounter of 03/19/13  CBC WITH DIFFERENTIAL      Result Value Range   WBC 5.5  4.0 - 10.5 K/uL   RBC 4.38  3.87 - 5.11 MIL/uL   Hemoglobin 13.3  12.0 - 15.0 g/dL   HCT 39.2  36.0 - 46.0 %   MCV 89.5  78.0 - 100.0 fL   MCH 30.4  26.0 - 34.0 pg   MCHC 33.9  30.0 - 36.0 g/dL   RDW 15.7 (*) 11.5 - 15.5 %   Platelets 173  150 - 400 K/uL   Neutrophils Relative % 62  43 - 77 %   Neutro Abs 3.4  1.7 - 7.7 K/uL   Lymphocytes Relative 29  12 - 46 %   Lymphs Abs 1.6  0.7 - 4.0 K/uL   Monocytes Relative 6  3 - 12 %   Monocytes Absolute 0.3  0.1 - 1.0 K/uL   Eosinophils Relative 3  0 - 5 %   Eosinophils Absolute 0.1  0.0 - 0.7 K/uL   Basophils Relative 1  0 - 1 %   Basophils Absolute 0.0  0.0 - 0.1 K/uL  COMPREHENSIVE METABOLIC PANEL      Result Value Range   Sodium 140  137 - 147 mEq/L   Potassium 3.7  3.7 - 5.3 mEq/L   Chloride 103  96 - 112 mEq/L   CO2 22  19 - 32 mEq/L   Glucose, Bld 95  70 - 99 mg/dL   BUN 9  6 - 23 mg/dL   Creatinine, Ser 1.37 (*) 0.50 - 1.10 mg/dL   Calcium 9.5  8.4 - 10.5 mg/dL   Total Protein 8.0  6.0 - 8.3 g/dL   Albumin 4.0  3.5 - 5.2 g/dL   AST 42 (*) 0 - 37 U/L   ALT 43 (*) 0 - 35 U/L   Alkaline Phosphatase 100  39 - 117 U/L   Total Bilirubin 0.4  0.3 - 1.2 mg/dL    GFR calc non Af Amer 44 (*) >90 mL/min   GFR calc Af Amer 52 (*) >90 mL/min  LIPASE, BLOOD      Result Value Range   Lipase 29  11 - 59 U/L  URINALYSIS, ROUTINE W REFLEX MICROSCOPIC      Result Value Range   Color, Urine YELLOW  YELLOW   APPearance HAZY (*) CLEAR   Specific Gravity, Urine 1.014  1.005 - 1.030   pH 6.5  5.0 - 8.0   Glucose, UA NEGATIVE  NEGATIVE mg/dL   Hgb urine dipstick NEGATIVE  NEGATIVE   Bilirubin Urine NEGATIVE  NEGATIVE   Ketones, ur NEGATIVE  NEGATIVE mg/dL   Protein, ur NEGATIVE  NEGATIVE mg/dL   Urobilinogen, UA 1.0  0.0 - 1.0 mg/dL   Nitrite NEGATIVE  NEGATIVE   Leukocytes, UA MODERATE (*) NEGATIVE  URINE MICROSCOPIC-ADD ON      Result Value Range   Squamous Epithelial / LPF FEW (*) RARE   WBC, UA 3-6  <3 WBC/hpf   Bacteria, UA FEW (*) RARE   No results found.    EKG Interpretation   None       MDM  No diagnosis found.  Patient with worsening abdominal pain, located in left lower quadrant. She has a history of diverticulitis. She also reports severe diarrhea that is watery. Check labs, give fluids, and CT scan.  Difficulty getting IV for contrast.  Will proceed with oral only as I am suspicious of diverticulitis.  3:48 PM Patient signed out to Eloise Harman, who will continue care.  Patient has chronic abdominal pain.  Has GI follow-up.    Plan:  Follow-up on CT.  Re-evaluate after CT.    Montine Circle, PA-C 03/19/13 1550

## 2013-03-19 NOTE — ED Provider Notes (Signed)
1545: Assumed care of the patient from Montine Circle, PA-C. 48 y.o. female pt with a history of Gastroparesis, Diverticulitis presents with Left lower abdominal pain and generalized abdominal pain with nausea and vomiting.  She is followed by [facility] and has an appointment 03/24/2013.  Will follow up on CT results.  Results for orders placed during the hospital encounter of 03/19/13  CBC WITH DIFFERENTIAL      Result Value Range   WBC 5.5  4.0 - 10.5 K/uL   RBC 4.38  3.87 - 5.11 MIL/uL   Hemoglobin 13.3  12.0 - 15.0 g/dL   HCT 39.2  36.0 - 46.0 %   MCV 89.5  78.0 - 100.0 fL   MCH 30.4  26.0 - 34.0 pg   MCHC 33.9  30.0 - 36.0 g/dL   RDW 15.7 (*) 11.5 - 15.5 %   Platelets 173  150 - 400 K/uL   Neutrophils Relative % 62  43 - 77 %   Neutro Abs 3.4  1.7 - 7.7 K/uL   Lymphocytes Relative 29  12 - 46 %   Lymphs Abs 1.6  0.7 - 4.0 K/uL   Monocytes Relative 6  3 - 12 %   Monocytes Absolute 0.3  0.1 - 1.0 K/uL   Eosinophils Relative 3  0 - 5 %   Eosinophils Absolute 0.1  0.0 - 0.7 K/uL   Basophils Relative 1  0 - 1 %   Basophils Absolute 0.0  0.0 - 0.1 K/uL  COMPREHENSIVE METABOLIC PANEL      Result Value Range   Sodium 140  137 - 147 mEq/L   Potassium 3.7  3.7 - 5.3 mEq/L   Chloride 103  96 - 112 mEq/L   CO2 22  19 - 32 mEq/L   Glucose, Bld 95  70 - 99 mg/dL   BUN 9  6 - 23 mg/dL   Creatinine, Ser 1.37 (*) 0.50 - 1.10 mg/dL   Calcium 9.5  8.4 - 10.5 mg/dL   Total Protein 8.0  6.0 - 8.3 g/dL   Albumin 4.0  3.5 - 5.2 g/dL   AST 42 (*) 0 - 37 U/L   ALT 43 (*) 0 - 35 U/L   Alkaline Phosphatase 100  39 - 117 U/L   Total Bilirubin 0.4  0.3 - 1.2 mg/dL   GFR calc non Af Amer 44 (*) >90 mL/min   GFR calc Af Amer 52 (*) >90 mL/min  LIPASE, BLOOD      Result Value Range   Lipase 29  11 - 59 U/L  URINALYSIS, ROUTINE W REFLEX MICROSCOPIC      Result Value Range   Color, Urine YELLOW  YELLOW   APPearance HAZY (*) CLEAR   Specific Gravity, Urine 1.014  1.005 - 1.030   pH 6.5  5.0 - 8.0    Glucose, UA NEGATIVE  NEGATIVE mg/dL   Hgb urine dipstick NEGATIVE  NEGATIVE   Bilirubin Urine NEGATIVE  NEGATIVE   Ketones, ur NEGATIVE  NEGATIVE mg/dL   Protein, ur NEGATIVE  NEGATIVE mg/dL   Urobilinogen, UA 1.0  0.0 - 1.0 mg/dL   Nitrite NEGATIVE  NEGATIVE   Leukocytes, UA MODERATE (*) NEGATIVE  URINE MICROSCOPIC-ADD ON      Result Value Range   Squamous Epithelial / LPF FEW (*) RARE   WBC, UA 3-6  <3 WBC/hpf   Bacteria, UA FEW (*) RARE   Ct Abdomen Pelvis Wo Contrast  03/19/2013   CLINICAL DATA:  Severe abdominal pain and inability to the with nausea and vomiting and diarrhea.  EXAM: CT ABDOMEN AND PELVIS WITHOUT CONTRAST  TECHNIQUE: Multidetector CT imaging of the abdomen and pelvis was performed following the standard protocol without intravenous contrast.  COMPARISON:  DG ABD ACUTE W/CHEST dated 12/08/2012  FINDINGS: The duodenal bulb and second portion of the duodenum demonstrate abnormal wall thickening. A discrete ulcer niche is not demonstrated. There is no suspicious mass demonstrated. The third and fourth portions of the duodenum appear normal. The stomach is moderately distended with the orally administered contrast but appears normal. The jejunum and ileum demonstrate a normal contrast and gas pattern. An uninflamed appendix is demonstrated. The contrast has reached only the right colon. There is no evidence of colonic obstruction. The transverse colon is incompletely distended and subjective wall thickening is suspected. There is no pericolonic inflammatory change.  The gallbladder is surgically absent. There is no abnormal fluid collection in the gallbladder fossa. The liver, pancreas, adrenal glands, and kidneys exhibit no acute abnormalities. The right kidney exhibits an extrarenal pelvis. No kidney stones nor evidence of obstruction are demonstrated. The spleen is normal in size. There is an approximately 1.2 cm diameter accessory spleen anterior to the primary spleen. The  caliber of the abdominal aorta is normal. There is no mesenteric or retroperitoneal lymphadenopathy.  The urinary bladder is moderately distended. The uterus is surgically absent. There are no adnexal masses. There is no inguinal hernia. There is a tiny fat containing umbilical hernia. The psoas musculature is normal in appearance.  The lumbar vertebral bodies are preserved in height. The bony pelvis exhibits no acute abnormality. The lung bases exhibit no acute abnormalities.  IMPRESSION: 1. There is abnormal appearance of the first and second portions of the duodenum worrisome for duodenitis or for peptic ulcer disease. No discrete ulcer is demonstrated. No periduodenal inflammatory changes are demonstrated. 2. There is no evidence of small or large bowel obstruction nor evidence of enteritis. There are subjective areas of wall thickening in the transverse colon but this is felt to be due to incomplete distention. One cannot absolutely exclude colitis in the appropriate clinical setting. 3. There is no acute hepatobiliary abnormality nor acute urinary tract abnormality. 4. There is no intra-abdominal or pelvic free fluid nor evidence of lymphadenopathy.   Electronically Signed   By: David  Martinique   On: 03/19/2013 17:54   MDM: CT results as above, will treat for possible duodenitis/PUD. Discussed labs and CT results with Dr. Carmin Muskrat who advises bowel rest, pain medication, nausea medication, and follow up with GI. 1800:The patient is requesting pain medication and nausea medication prior to discharge.   She reports increase in nausea and pain since oral contrast. Discussed lab results, imaging results, and treatment plan with the patient.  Return precautions given. Reports understanding and no other concerns at this time.  Patient is stable for discharge at this time.  Meds given in ED:  Medications  iohexol (OMNIPAQUE) 300 MG/ML solution 25 mL (not administered)  HYDROmorphone (DILAUDID)  injection 1 mg (1 mg Intravenous Given 03/19/13 1407)  ondansetron (ZOFRAN) injection 4 mg (4 mg Intravenous Given 03/19/13 1406)  sodium chloride 0.9 % bolus 1,000 mL (0 mLs Intravenous Stopped 03/19/13 1541)  ondansetron (ZOFRAN) injection 4 mg (4 mg Intravenous Given 03/19/13 1627)  HYDROmorphone (DILAUDID) injection 1 mg (1 mg Intravenous Given 03/19/13 1658)  HYDROmorphone (DILAUDID) injection 1 mg (1 mg Intravenous Given 03/19/13 1824)  ondansetron (ZOFRAN) injection 4 mg (4 mg  Intravenous Given 03/19/13 1826)    Discharge Medication List as of 03/19/2013  6:39 PM    START taking these medications   Details  !! dicyclomine (BENTYL) 20 MG tablet Take 1 tablet (20 mg total) by mouth 2 (two) times daily., Starting 03/19/2013, Until Discontinued, Print    omeprazole (PRILOSEC) 20 MG capsule Take 1 capsule (20 mg total) by mouth 2 (two) times daily before a meal., Starting 03/19/2013, Until Discontinued, Print    !! ondansetron (ZOFRAN ODT) 4 MG disintegrating tablet Take 1 tablet (4 mg total) by mouth every 8 (eight) hours as needed for nausea or vomiting., Starting 03/19/2013, Until Discontinued, Print    promethazine (PHENERGAN) 25 MG suppository Place 1 suppository (25 mg total) rectally every 6 (six) hours as needed for nausea or vomiting., Starting 03/19/2013, Until Discontinued, Print     !! - Potential duplicate medications found. Please discuss with provider.          Lorrine Kin, PA-C 03/19/13 [phone]

## 2013-03-19 NOTE — ED Notes (Signed)
CT notified of pt completed contrast

## 2013-03-19 NOTE — ED Notes (Signed)
Pt reports this is ongoing problem, has been seen multiple times for it and was here on Sunday. Having abd pain, n/v/d, unable to eat or drink. Pt has made appt with GI but still having severe pain at this time.

## 2013-03-20 LAB — URINE CULTURE: Colony Count: 6000

## 2013-03-20 NOTE — ED Provider Notes (Signed)
I was available for consultation during the completion of this patient's course.  Carmin Muskrat, MD 03/20/13 Shelah Lewandowsky

## 2013-03-21 ENCOUNTER — Emergency Department: Payer: Self-pay | Admitting: Internal Medicine

## 2013-03-21 ENCOUNTER — Encounter (HOSPITAL_COMMUNITY): Payer: Self-pay | Admitting: Emergency Medicine

## 2013-03-21 ENCOUNTER — Emergency Department (HOSPITAL_COMMUNITY)
Admission: EM | Admit: 2013-03-21 | Discharge: 2013-03-21 | Disposition: A | Discharge status: Home / Self Care | Payer: Medicare Other | Attending: Emergency Medicine | Admitting: Emergency Medicine

## 2013-03-21 DIAGNOSIS — Z8639 Personal history of other endocrine, nutritional and metabolic disease: Secondary | ICD-10-CM | POA: Insufficient documentation

## 2013-03-21 DIAGNOSIS — IMO0001 Reserved for inherently not codable concepts without codable children: Secondary | ICD-10-CM | POA: Insufficient documentation

## 2013-03-21 DIAGNOSIS — Z862 Personal history of diseases of the blood and blood-forming organs and certain disorders involving the immune mechanism: Secondary | ICD-10-CM | POA: Insufficient documentation

## 2013-03-21 DIAGNOSIS — G8929 Other chronic pain: Secondary | ICD-10-CM | POA: Insufficient documentation

## 2013-03-21 DIAGNOSIS — R5381 Other malaise: Secondary | ICD-10-CM | POA: Insufficient documentation

## 2013-03-21 DIAGNOSIS — R1032 Left lower quadrant pain: Secondary | ICD-10-CM | POA: Insufficient documentation

## 2013-03-21 DIAGNOSIS — E079 Disorder of thyroid, unspecified: Secondary | ICD-10-CM | POA: Insufficient documentation

## 2013-03-21 DIAGNOSIS — R1084 Generalized abdominal pain: Secondary | ICD-10-CM | POA: Insufficient documentation

## 2013-03-21 DIAGNOSIS — R109 Unspecified abdominal pain: Secondary | ICD-10-CM

## 2013-03-21 DIAGNOSIS — R1110 Vomiting, unspecified: Secondary | ICD-10-CM

## 2013-03-21 DIAGNOSIS — R5383 Other fatigue: Secondary | ICD-10-CM

## 2013-03-21 DIAGNOSIS — R197 Diarrhea, unspecified: Secondary | ICD-10-CM | POA: Insufficient documentation

## 2013-03-21 DIAGNOSIS — R52 Pain, unspecified: Secondary | ICD-10-CM | POA: Insufficient documentation

## 2013-03-21 DIAGNOSIS — Z79899 Other long term (current) drug therapy: Secondary | ICD-10-CM | POA: Insufficient documentation

## 2013-03-21 DIAGNOSIS — Z8739 Personal history of other diseases of the musculoskeletal system and connective tissue: Secondary | ICD-10-CM | POA: Insufficient documentation

## 2013-03-21 DIAGNOSIS — R112 Nausea with vomiting, unspecified: Secondary | ICD-10-CM | POA: Insufficient documentation

## 2013-03-21 DIAGNOSIS — Z8719 Personal history of other diseases of the digestive system: Secondary | ICD-10-CM | POA: Insufficient documentation

## 2013-03-21 LAB — CBC
HCT: 39.1 % (ref 36.0–46.0)
Hemoglobin: 13.7 g/dL (ref 12.0–15.0)
MCH: 31.1 pg (ref 26.0–34.0)
MCHC: 35 g/dL (ref 30.0–36.0)
MCV: 88.7 fL (ref 78.0–100.0)
PLATELETS: 194 10*3/uL (ref 150–400)
RBC: 4.41 MIL/uL (ref 3.87–5.11)
RDW: 15.9 % — ABNORMAL HIGH (ref 11.5–15.5)
WBC: 5 10*3/uL (ref 4.0–10.5)

## 2013-03-21 LAB — URINE MICROSCOPIC-ADD ON

## 2013-03-21 LAB — COMPREHENSIVE METABOLIC PANEL
ALT: 40 U/L — AB (ref 0–35)
AST: 44 U/L — ABNORMAL HIGH (ref 0–37)
Albumin: 4.4 g/dL (ref 3.5–5.2)
Alkaline Phosphatase: 98 U/L (ref 39–117)
BUN: 7 mg/dL (ref 6–23)
CO2: 22 mEq/L (ref 19–32)
CREATININE: 1.35 mg/dL — AB (ref 0.50–1.10)
Calcium: 9.4 mg/dL (ref 8.4–10.5)
Chloride: 101 mEq/L (ref 96–112)
GFR calc non Af Amer: 45 mL/min — ABNORMAL LOW (ref >90)
GFR, EST AFRICAN AMERICAN: 52 mL/min — AB (ref >90)
Glucose, Bld: 90 mg/dL (ref 70–99)
Potassium: 3.4 mEq/L — ABNORMAL LOW (ref 3.7–5.3)
SODIUM: 141 meq/L (ref 137–147)
TOTAL PROTEIN: 8.4 g/dL — AB (ref 6.0–8.3)
Total Bilirubin: 0.6 mg/dL (ref 0.3–1.2)

## 2013-03-21 LAB — URINALYSIS, ROUTINE W REFLEX MICROSCOPIC
BILIRUBIN URINE: NEGATIVE
Glucose, UA: NEGATIVE mg/dL
Hgb urine dipstick: NEGATIVE
Ketones, ur: NEGATIVE mg/dL
Nitrite: NEGATIVE
PH: 7.5 (ref 5.0–8.0)
Protein, ur: NEGATIVE mg/dL
SPECIFIC GRAVITY, URINE: 1.009 (ref 1.005–1.030)
UROBILINOGEN UA: 0.2 mg/dL (ref 0.0–1.0)

## 2013-03-21 LAB — LIPASE, BLOOD: Lipase: 20 U/L (ref 11–59)

## 2013-03-21 MED ORDER — ONDANSETRON HCL 4 MG/2ML IJ SOLN
4.0000 mg | Freq: Once | INTRAMUSCULAR | Status: AC
  Administered 2013-03-21: 4 mg via INTRAVENOUS
  Filled 2013-03-21: qty 2

## 2013-03-21 MED ORDER — HYDROMORPHONE HCL PF 1 MG/ML IJ SOLN
1.0000 mg | Freq: Once | INTRAMUSCULAR | Status: AC
  Administered 2013-03-21: 1 mg via INTRAVENOUS
  Filled 2013-03-21: qty 1

## 2013-03-21 NOTE — Discharge Instructions (Signed)
Abdominal Pain, Adult Many things can cause abdominal pain. Usually, abdominal pain is not caused by a disease and will improve without treatment. It can often be observed and treated at home. Your health care provider will do a physical exam and possibly order blood tests and X-rays to help determine the seriousness of your pain. However, in many cases, more time must pass before a clear cause of the pain can be found. Before that point, your health care provider may not know if you need more testing or further treatment. HOME CARE INSTRUCTIONS  Monitor your abdominal pain for any changes. The following actions may help to alleviate any discomfort you are experiencing:  Only take over-the-counter or prescription medicines as directed by your health care provider.  Do not take laxatives unless directed to do so by your health care provider.  Try a clear liquid diet (broth, tea, or water) as directed by your health care provider. Slowly move to a bland diet as tolerated. SEEK MEDICAL CARE IF:  You have unexplained abdominal pain.  You have abdominal pain associated with nausea or diarrhea.  You have pain when you urinate or have a bowel movement.  You experience abdominal pain that wakes you in the night.  You have abdominal pain that is worsened or improved by eating food.  You have abdominal pain that is worsened with eating fatty foods. SEEK IMMEDIATE MEDICAL CARE IF:   Your pain does not go away within 2 hours.  You have a fever.  You keep throwing up (vomiting).  Your pain is felt only in portions of the abdomen, such as the right side or the left lower portion of the abdomen.  You pass bloody or black tarry stools. MAKE SURE YOU:  Understand these instructions.   Will watch your condition.   Will get help right away if you are not doing well or get worse.  Document Released: 11/08/2004 Document Revised: 11/19/2012 Document Reviewed: 10/08/2012 [facility] Patient  Information 2014 [place].  Diet for Diarrhea, Adult Frequent, runny stools (diarrhea) may be caused or worsened by food or drink. Diarrhea may be relieved by changing your diet. Since diarrhea can last up to 7 days, it is easy for you to lose too much fluid from the body and become dehydrated. Fluids that are lost need to be replaced. Along with a modified diet, make sure you drink enough fluids to keep your urine clear or pale yellow. DIET INSTRUCTIONS  Ensure adequate fluid intake (hydration): have 1 cup (8 oz) of fluid for each diarrhea episode. Avoid fluids that contain simple sugars or sports drinks, fruit juices, whole milk products, and sodas. Your urine should be clear or pale yellow if you are drinking enough fluids. Hydrate with an oral rehydration solution that you can purchase at pharmacies, retail stores, and online. You can prepare an oral rehydration solution at home by mixing the following ingredients together:    tsp table salt.   tsp baking soda.   tsp salt substitute containing potassium chloride.  1  tablespoons sugar.  1 L (34 oz) of water.  Certain foods and beverages may increase the speed at which food moves through the gastrointestinal (GI) tract. These foods and beverages should be avoided and include:  Caffeinated and alcoholic beverages.  High-fiber foods, such as raw fruits and vegetables, nuts, seeds, and whole grain breads and cereals.  Foods and beverages sweetened with sugar alcohols, such as xylitol, sorbitol, and mannitol.  Some foods may be well tolerated  and may help thicken stool including:  Starchy foods, such as rice, toast, pasta, low-sugar cereal, oatmeal, grits, baked potatoes, crackers, and bagels.   Bananas.   Applesauce.  Add probiotic-rich foods to help increase healthy bacteria in the GI tract, such as yogurt and fermented milk products. RECOMMENDED FOODS AND BEVERAGES Starches Choose foods with less than 2 g of fiber per  serving.  Recommended:  White, [place], and pita breads, plain rolls, buns, bagels. Plain muffins, matzo. Soda, saltine, or graham crackers. Pretzels, melba toast, zwieback. Cooked cereals made with water: cornmeal, farina, cream cereals. Dry cereals: refined corn, wheat, rice. Potatoes prepared any way without skins, refined macaroni, spaghetti, noodles, refined rice.  Avoid:  Bread, rolls, or crackers made with whole wheat, multi-grains, rye, bran seeds, nuts, or coconut. Corn tortillas or taco shells. Cereals containing whole grains, multi-grains, bran, coconut, nuts, raisins. Cooked or dry oatmeal. Coarse wheat cereals, granola. Cereals advertised as "high-fiber." Potato skins. Whole grain pasta, wild or brown rice. Popcorn. Sweet potatoes, yams. Sweet rolls, doughnuts, waffles, pancakes, sweet breads. Vegetables  Recommended: Strained tomato and vegetable juices. Most well-cooked and canned vegetables without seeds. Fresh: Tender lettuce, cucumber without the skin, cabbage, spinach, bean sprouts.  Avoid: Fresh, cooked, or canned: Artichokes, baked beans, beet greens, broccoli, Brussels sprouts, corn, kale, legumes, peas, sweet potatoes. Cooked: Green or red cabbage, spinach. Avoid large servings of any vegetables because vegetables shrink when cooked, and they contain more fiber per serving than fresh vegetables. Fruit  Recommended: Cooked or canned: Apricots, applesauce, cantaloupe, cherries, fruit cocktail, grapefruit, grapes, kiwi, mandarin oranges, peaches, pears, plums, watermelon. Fresh: Apples without skin, ripe banana, grapes, cantaloupe, cherries, grapefruit, peaches, oranges, plums. Keep servings limited to  cup or 1 piece.  Avoid: Fresh: Apples with skin, apricots, mangoes, pears, raspberries, strawberries. Prune juice, stewed or dried prunes. Dried fruits, raisins, dates. Large servings of all fresh fruits. Protein  Recommended: Ground or well-cooked tender beef, ham, veal, lamb,  pork, or poultry. Eggs. Fish, oysters, shrimp, lobster, other seafoods. Liver, organ meats.  Avoid: Tough, fibrous meats with gristle. Peanut butter, smooth or chunky. Cheese, nuts, seeds, legumes, dried peas, beans, lentils. Dairy  Recommended: Yogurt, lactose-free milk, kefir, drinkable yogurt, buttermilk, soy milk, or plain hard cheese.  Avoid: Milk, chocolate milk, beverages made with milk, such as milkshakes. Soups  Recommended: Bouillon, broth, or soups made from allowed foods. Any strained soup.  Avoid: Soups made from vegetables that are not allowed, cream or milk-based soups. Desserts and Sweets  Recommended: Sugar-free gelatin, sugar-free frozen ice pops made without sugar alcohol.  Avoid: Plain cakes and cookies, pie made with fruit, pudding, custard, cream pie. Gelatin, fruit, ice, sherbet, frozen ice pops. Ice cream, ice milk without nuts. Plain hard candy, honey, jelly, molasses, syrup, sugar, chocolate syrup, gumdrops, marshmallows. Fats and Oils  Recommended: Limit fats to less than 8 tsp per day.  Avoid: Seeds, nuts, olives, avocados. Margarine, butter, cream, mayonnaise, salad oils, plain salad dressings. Plain gravy, crisp bacon without rind. Beverages  Recommended: Water, decaffeinated teas, oral rehydration solutions, sugar-free beverages not sweetened with sugar alcohols.  Avoid: Fruit juices, caffeinated beverages (coffee, tea, soda), alcohol, sports drinks, or lemon-lime soda. Condiments  Recommended: Ketchup, mustard, horseradish, vinegar, cocoa powder. Spices in moderation: allspice, basil, bay leaves, celery powder or leaves, cinnamon, cumin powder, curry powder, ginger, mace, marjoram, onion or garlic powder, oregano, paprika, parsley flakes, ground pepper, rosemary, sage, savory, tarragon, thyme, turmeric.  Avoid: Coconut, honey. Document Released: 04/21/2003 Document Revised: 10/24/2011 Document  Reviewed: 06/15/2011 [employer] Patient Information 2014  [place], [place].  Nausea and Vomiting Nausea means you feel sick to your stomach. Throwing up (vomiting) is a reflex where stomach contents come out of your mouth. HOME CARE   Take medicine as told by your doctor.  Do not force yourself to eat. However, you do need to drink fluids.  If you feel like eating, eat a normal diet as told by your doctor.  Eat rice, wheat, potatoes, bread, lean meats, yogurt, fruits, and vegetables.  Avoid high-fat foods.  Drink enough fluids to keep your pee (urine) clear or pale yellow.  Ask your doctor how to replace body fluid losses (rehydrate). Signs of body fluid loss (dehydration) include:  Feeling very thirsty.  Dry lips and mouth.  Feeling dizzy.  Dark pee.  Peeing less than normal.  Feeling confused.  Fast breathing or heart rate. GET HELP RIGHT AWAY IF:   You have blood in your throw up.  You have black or bloody poop (stool).  You have a bad headache or stiff neck.  You feel confused.  You have bad belly (abdominal) pain.  You have chest pain or trouble breathing.  You do not pee at least once every 8 hours.  You have cold, clammy skin.  You keep throwing up after 24 to 48 hours.  You have a fever. MAKE SURE YOU:   Understand these instructions.  Will watch your condition.  Will get help right away if you are not doing well or get worse. Document Released: 07/18/2007 Document Revised: 04/23/2011 Document Reviewed: 06/30/2010 [facility] Patient Information 2014 [place], [place].

## 2013-03-21 NOTE — ED Notes (Signed)
IV team at bedside 

## 2013-03-21 NOTE — ED Notes (Signed)
Pt states she would like IV team to be paged, IV team paged and states they will come down.

## 2013-03-21 NOTE — ED Notes (Signed)
Pt was here on Thursday for same. Having abd pain and n/v/d and no relief of symptoms.

## 2013-03-21 NOTE — ED Provider Notes (Addendum)
CSN: [id number]     Arrival date & time 03/21/13  3762 History   First MD Initiated Contact with Patient 03/21/13 1939     Chief Complaint  Patient presents with  . Abdominal Pain  . Diarrhea  . Emesis   (Consider location/radiation/quality/duration/timing/severity/associated sxs/prior Treatment) HPI Comments: 48 yo wf presents with acute on chronic abd pain, n/v/d for past 6 days.   Pt presents with 1 week of symptoms - Seen in [facility] ER twice and at [facility] twice in the past 6 days.  Gastroenterology was consulted on 03/19/13 by EDP.  Pt has a f/u with GI on 03/24/13.  Pt states her symptoms have worsened and she can't keep anything down.    LMP: Pt had TAH  Pt has a pmh of RA, gastroparesis, DDD, diverticulitis, Fibromyalgia, Thyroid disease, Lupus, and porpyria.  Patient is a 48 y.o. female presenting with abdominal pain, diarrhea, and vomiting. The history is provided by the patient and the spouse.  Abdominal Pain Pain location:  Generalized Pain quality: aching and bloating   Pain radiates to:  LLQ Pain severity:  Severe Onset quality:  Gradual Duration: Years of Symptoms. Timing:  Intermittent Progression:  Waxing and waning Chronicity:  Chronic Relieved by:  Nothing Exacerbated by: eating. Associated symptoms: diarrhea, fatigue, nausea and vomiting   Associated symptoms: no constipation   Nausea:    Severity:  Severe   Onset quality:  Gradual Diarrhea Associated symptoms: abdominal pain and vomiting   Emesis Associated symptoms: abdominal pain and diarrhea     Past Medical History  Diagnosis Date  . Rheumatoid arthritis   . Gastroparesis   . Degenerative disc disease   . Fibromyalgia   . Diverticulitis   . Thyroid disease   . Lupus   . Porphyria    Past Surgical History  Procedure Laterality Date  . Cesarean section    . Replacement total knee Bilateral   . Abdominal hysterectomy    . Cholecystectomy     Family History  Problem Relation Age of Onset  . Leukemia  Mother    History  Substance Use Topics  . Smoking status: Never Smoker   . Smokeless tobacco: Not on file  . Alcohol Use: Yes     Comment: occasionally   OB History   Grav Para Term Preterm Abortions TAB SAB Ect Mult Living                 Review of Systems  Constitutional: Positive for activity change, appetite change and fatigue.  HENT: Negative.   Eyes: Negative.   Respiratory: Negative.   Cardiovascular: Negative.   Gastrointestinal: Positive for nausea, vomiting, abdominal pain and diarrhea. Negative for constipation, blood in stool and anal bleeding.  Endocrine: Negative.   Genitourinary: Negative.   Musculoskeletal: Negative.   Allergic/Immunologic: Negative.   Neurological: Negative.   Hematological: Negative.   All other systems reviewed and are negative.    Allergies  Methotrexate derivatives; Morphine and related; and Relafen  Home Medications   Current Outpatient Rx  Name  Route  Sig  Dispense  Refill  . dicyclomine (BENTYL) 20 MG tablet   Oral   Take 20 mg by mouth every 6 (six) hours as needed (for abdominal cramps).         . gabapentin (NEURONTIN) 600 MG tablet   Oral   Take 600 mg by mouth 3 (three) times daily.         [name] levothyroxine (SYNTHROID, LEVOTHROID) 25 MCG tablet  Oral   Take 25 mcg by mouth daily before breakfast. Take with 325mcg to = 384mcg total         . levothyroxine (SYNTHROID, LEVOTHROID) 300 MCG tablet   Oral   Take 300 mcg by mouth daily before breakfast. Take with a 53mcg tablet to = 322mcg         . metoCLOPramide (REGLAN) 10 MG tablet   Oral   Take 10 mg by mouth 4 (four) times daily. Patient takes it everyday per patient         . omeprazole (PRILOSEC) 20 MG capsule   Oral   Take 1 capsule (20 mg total) by mouth 2 (two) times daily before a meal.   60 capsule   0   . ondansetron (ZOFRAN ODT) 4 MG disintegrating tablet   Oral   Take 1 tablet (4 mg total) by mouth every 8 (eight) hours as needed for  nausea or vomiting.   10 tablet   0   . oxyCODONE (ROXICODONE) 5 MG immediate release tablet   Oral   Take 1 tablet (5 mg total) by mouth every 6 (six) hours as needed for severe pain.   12 tablet   0   . promethazine (PHENERGAN) 25 MG suppository   Rectal   Place 1 suppository (25 mg total) rectally every 6 (six) hours as needed for nausea or vomiting.   10 each   0   . promethazine (PHENERGAN) 25 MG tablet   Oral   Take 25 mg by mouth every 6 (six) hours as needed for nausea.         . tizanidine (ZANAFLEX) 2 MG capsule   Oral   Take 4 mg by mouth 3 (three) times daily as needed for muscle spasms.           BP 139/95  Pulse 61  Temp(Src) 97.9 F (36.6 C) (Oral)  Resp 12  SpO2 97% Physical Exam  Vitals reviewed. Constitutional: She is oriented to person, place, and time. She appears well-developed and well-nourished. No distress.  HENT:  Head: Normocephalic.  Nose: Nose normal.  Mouth/Throat: No oropharyngeal exudate.  Eyes: Right eye exhibits no discharge. Left eye exhibits no discharge.  Neck: Neck supple. No JVD present.  Cardiovascular: Normal rate and regular rhythm.   Pulmonary/Chest: Effort normal and breath sounds normal. No stridor.  Abdominal: Soft. Bowel sounds are normal. She exhibits no distension and no mass. There is no hepatosplenomegaly, splenomegaly or hepatomegaly. There is generalized tenderness. There is no rigidity, no rebound, no guarding, no CVA tenderness, no tenderness at McBurney's point and negative Murphy's sign. No hernia.  Genitourinary:  Deferred.  Pt without gyn symptoms.    Musculoskeletal: Normal range of motion. She exhibits no edema and no tenderness.  Neurological: She is alert and oriented to person, place, and time. She has normal reflexes.  Skin: She is not diaphoretic.    ED Course  Procedures (including critical care time) Labs Review Labs Reviewed  CBC - Abnormal; Notable for the following:    RDW 15.9 (*)    All  other components within normal limits  COMPREHENSIVE METABOLIC PANEL - Abnormal; Notable for the following:    Potassium 3.4 (*)    Creatinine, Ser 1.35 (*)    Total Protein 8.4 (*)    AST 44 (*)    ALT 40 (*)    GFR calc non Af Amer 45 (*)    GFR calc Af Amer 52 (*)  All other components within normal limits  URINALYSIS, ROUTINE W REFLEX MICROSCOPIC - Abnormal; Notable for the following:    Leukocytes, UA TRACE (*)    All other components within normal limits  URINE MICROSCOPIC-ADD ON - Abnormal; Notable for the following:    Squamous Epithelial / LPF FEW (*)    Bacteria, UA FEW (*)    All other components within normal limits  LIPASE, BLOOD   Imaging Review No results found.  EKG Interpretation   None      Angiocath insertion Performed by: Curly Rim, DAVID  Consent: Verbal consent obtained. Risks and benefits: risks, benefits and alternatives were discussed Time out: Immediately prior to procedure a "time out" was called to verify the correct patient, procedure, equipment, support staff and site/side marked as required.  Preparation: Patient was prepped and draped in the usual sterile fashion.  Vein Location: L Bicep  Ultrasound Guided IV  Gauge: 18 gauge  Normal blood return and flush without difficulty Patient tolerance: Patient tolerated the procedure well with no immediate complications.     MDM   1. Abdominal pain   2. Vomiting    48 year old white female with history of chronic abdominal pain, nausea vomiting, diarrhea presents to the emergency department with same. Patient has a complex history of gastroenterology complaints. Of note she is a past mental history of rheumatoid arthritis, gastroparesis, degenerative disc disease, diverticulitis, thyroid disease, lupus, and porphyria.  Recently she has been seen approximately 5 times within the past 6 days for her GI symptoms. [facility] as [facility]. She has a followup  with gastroenterology on 03/24/2013. The patient states that her symptoms are often brought on by stress and she has a two-week grandchild who is in the ICU on life support at this time. This is a significant stressor which she endorses worsens her symptoms. She currently takes Bentyl, Neurontin, Reglan, Prilosec, Zofran, Roxicodone, Phenergan, and Zanaflex for her symptoms. She had a recent CT of the abdomen and pelvis on March 19, 2013 which revealed nonspecific findings, but no acute pathology.  Access was difficult to obtain so I performed an ultrasound-guided peripheral IV without complications. Please see procedure note. Labwork review reveals negative urinalysis - with the exception of trace leukocytes and few bacteria and few squamous cells. CBC was negative. CMP specific for creatinine 1.35, potassium 3.4, mild elevation of AST and ALT.  Lipase was normal.  The patient received 2 rounds of IV Dilaudid and one dose of Zofran. Her symptoms significantly improved. I had a lengthy discussion with her regarding her chronic symptoms, her recent evaluations, her pending GI appointment, and her physical exam and lab work results. I see no indication for emergent consultation, admission, or further imaging at this time. Patient is comfortable going home and continue with her outpatient medications. I do not suspect serious bacterial illness, surgical abdomen, or other emergent issue at this time. Husband agrees with the plan as well.   Elmer Sow, MD 03/21/13 [id number]  Elmer Sow, MD 03/22/13 [phone]

## 2013-03-21 NOTE — ED Notes (Signed)
Dr. Curly Rim, EDP, made aware that pt now has IV access but that blood was not able to be drawn by phlebotomy or by IV team. States he will attempt US guided IV. Pt and family member updated.

## 2013-03-25 ENCOUNTER — Observation Stay: Payer: Self-pay | Admitting: Internal Medicine

## 2013-03-25 LAB — BASIC METABOLIC PANEL
Anion Gap: 10 (ref 7–16)
BUN: 10 mg/dL (ref 7–18)
CALCIUM: 9.8 mg/dL (ref 8.5–10.1)
Chloride: 101 mmol/L (ref 98–107)
Co2: 21 mmol/L (ref 21–32)
Creatinine: 1.36 mg/dL — ABNORMAL HIGH (ref 0.60–1.30)
EGFR (African American): 53 — ABNORMAL LOW
EGFR (Non-African Amer.): 46 — ABNORMAL LOW
Glucose: 103 mg/dL — ABNORMAL HIGH (ref 65–99)
Osmolality: 264 (ref 275–301)
Potassium: 4 mmol/L (ref 3.5–5.1)
Sodium: 132 mmol/L — ABNORMAL LOW (ref 136–145)

## 2013-03-25 LAB — CBC
HCT: 50.9 % — ABNORMAL HIGH (ref 35.0–47.0)
HGB: 17.3 g/dL — ABNORMAL HIGH (ref 12.0–16.0)
MCH: 30.2 pg (ref 26.0–34.0)
MCHC: 33.9 g/dL (ref 32.0–36.0)
MCV: 89 fL (ref 80–100)
Platelet: 208 10*3/uL (ref 150–440)
RBC: 5.71 10*6/uL — ABNORMAL HIGH (ref 3.80–5.20)
RDW: 17.1 % — AB (ref 11.5–14.5)
WBC: 9.4 10*3/uL (ref 3.6–11.0)

## 2013-03-25 LAB — TROPONIN I
Troponin-I: <0.02 ng/mL
Troponin-I: <0.02 ng/mL

## 2013-04-07 ENCOUNTER — Encounter (HOSPITAL_COMMUNITY): Payer: Self-pay | Admitting: Emergency Medicine

## 2013-04-07 ENCOUNTER — Emergency Department (HOSPITAL_COMMUNITY)
Admission: EM | Admit: 2013-04-07 | Discharge: 2013-04-07 | Disposition: A | Discharge status: Home / Self Care | Payer: Medicare Other | Attending: Emergency Medicine | Admitting: Emergency Medicine

## 2013-04-07 DIAGNOSIS — R109 Unspecified abdominal pain: Secondary | ICD-10-CM

## 2013-04-07 DIAGNOSIS — K3184 Gastroparesis: Secondary | ICD-10-CM | POA: Insufficient documentation

## 2013-04-07 DIAGNOSIS — G8929 Other chronic pain: Secondary | ICD-10-CM | POA: Insufficient documentation

## 2013-04-07 DIAGNOSIS — Z862 Personal history of diseases of the blood and blood-forming organs and certain disorders involving the immune mechanism: Secondary | ICD-10-CM | POA: Insufficient documentation

## 2013-04-07 DIAGNOSIS — R5381 Other malaise: Secondary | ICD-10-CM | POA: Insufficient documentation

## 2013-04-07 DIAGNOSIS — E079 Disorder of thyroid, unspecified: Secondary | ICD-10-CM | POA: Insufficient documentation

## 2013-04-07 DIAGNOSIS — Z8739 Personal history of other diseases of the musculoskeletal system and connective tissue: Secondary | ICD-10-CM | POA: Insufficient documentation

## 2013-04-07 DIAGNOSIS — M069 Rheumatoid arthritis, unspecified: Secondary | ICD-10-CM | POA: Insufficient documentation

## 2013-04-07 DIAGNOSIS — R5383 Other fatigue: Secondary | ICD-10-CM

## 2013-04-07 DIAGNOSIS — R1032 Left lower quadrant pain: Secondary | ICD-10-CM | POA: Insufficient documentation

## 2013-04-07 DIAGNOSIS — Z8639 Personal history of other endocrine, nutritional and metabolic disease: Secondary | ICD-10-CM | POA: Insufficient documentation

## 2013-04-07 DIAGNOSIS — R110 Nausea: Secondary | ICD-10-CM | POA: Insufficient documentation

## 2013-04-07 LAB — CBC WITH DIFFERENTIAL/PLATELET
Basophils Absolute: 0.1 10*3/uL (ref 0.0–0.1)
Basophils Relative: 1 % (ref 0–1)
EOS ABS: 0.2 10*3/uL (ref 0.0–0.7)
EOS PCT: 3 % (ref 0–5)
HCT: 49.3 % — ABNORMAL HIGH (ref 36.0–46.0)
Hemoglobin: 17.9 g/dL — ABNORMAL HIGH (ref 12.0–15.0)
Lymphocytes Relative: 31 % (ref 12–46)
Lymphs Abs: 1.9 10*3/uL (ref 0.7–4.0)
MCH: 31.7 pg (ref 26.0–34.0)
MCHC: 36.3 g/dL — ABNORMAL HIGH (ref 30.0–36.0)
MCV: 87.3 fL (ref 78.0–100.0)
Monocytes Absolute: 0.2 10*3/uL (ref 0.1–1.0)
Monocytes Relative: 4 % (ref 3–12)
NEUTROS PCT: 61 % (ref 43–77)
Neutro Abs: 3.7 10*3/uL (ref 1.7–7.7)
Platelets: 201 10*3/uL (ref 150–400)
RBC: 5.65 MIL/uL — ABNORMAL HIGH (ref 3.87–5.11)
RDW: 15.7 % — AB (ref 11.5–15.5)
WBC: 6.1 10*3/uL (ref 4.0–10.5)

## 2013-04-07 LAB — COMPREHENSIVE METABOLIC PANEL
ALBUMIN: 4.9 g/dL (ref 3.5–5.2)
ALT: 94 U/L — AB (ref 0–35)
AST: 125 U/L — ABNORMAL HIGH (ref 0–37)
Alkaline Phosphatase: 173 U/L — ABNORMAL HIGH (ref 39–117)
BUN: 11 mg/dL (ref 6–23)
CALCIUM: 10.3 mg/dL (ref 8.4–10.5)
CO2: 21 mEq/L (ref 19–32)
Chloride: 93 mEq/L — ABNORMAL LOW (ref 96–112)
Creatinine, Ser: 1.31 mg/dL — ABNORMAL HIGH (ref 0.50–1.10)
GFR calc non Af Amer: 47 mL/min — ABNORMAL LOW (ref >90)
GFR, EST AFRICAN AMERICAN: 54 mL/min — AB (ref >90)
GLUCOSE: 83 mg/dL (ref 70–99)
Potassium: 3.2 mEq/L — ABNORMAL LOW (ref 3.7–5.3)
Sodium: 138 mEq/L (ref 137–147)
TOTAL PROTEIN: 9.7 g/dL — AB (ref 6.0–8.3)
Total Bilirubin: 0.8 mg/dL (ref 0.3–1.2)

## 2013-04-07 LAB — LIPASE, BLOOD: LIPASE: 13 U/L (ref 11–59)

## 2013-04-07 MED ORDER — SODIUM CHLORIDE 0.9 % IV BOLUS (SEPSIS): 1000.0000 mL | Freq: Once | INTRAVENOUS | Status: DC

## 2013-04-07 MED ORDER — HYDROMORPHONE HCL PF 1 MG/ML IJ SOLN
1.0000 mg | Freq: Once | INTRAMUSCULAR | Status: AC
  Administered 2013-04-07: 1 mg via INTRAVENOUS
  Filled 2013-04-07: qty 1

## 2013-04-07 MED ORDER — SODIUM CHLORIDE 0.9 % IV BOLUS (SEPSIS)
1000.0000 mL | Freq: Once | INTRAVENOUS | Status: AC
  Administered 2013-04-07: 1000 mL via INTRAVENOUS

## 2013-04-07 MED ORDER — ONDANSETRON HCL 4 MG/2ML IJ SOLN
4.0000 mg | Freq: Once | INTRAMUSCULAR | Status: AC
  Administered 2013-04-07: 4 mg via INTRAVENOUS
  Filled 2013-04-07: qty 2

## 2013-04-07 NOTE — ED Provider Notes (Signed)
CSN: [id number]     Arrival date & time 04/07/13  1110 History   First MD Initiated Contact with Patient 04/07/13 1138     Chief Complaint  Patient presents with  . Abdominal Pain  . Nausea     (Consider location/radiation/quality/duration/timing/severity/associated sxs/prior Treatment) Patient is a 48 y.o. female presenting with abdominal pain. The history is provided by the patient.  Abdominal Pain Pain location:  LLQ Pain quality: sharp   Pain radiates to:  Does not radiate Pain severity:  Moderate Duration:  1 day Associated symptoms: fatigue and nausea   Associated symptoms: no chills, no dysuria, no fever, no hematemesis, no hematochezia and no hematuria    patient is a 48 year old female who presents with abdominal pain. She has multiple visits to the emergency department for chronic abdominal pain. She was recently seen on 2/1, 2/5 and 2/7. She reports that she was to follow-up with GI a few days ago and she missed her appt due to inclement weather. She feels fatigued and has not been eating and drinking much at home because she says it makes her stomach hurt more to eat and drink. She denies fever, chills or recent illness. Pt has a history of RA, gastroparesis, diverticulitis, fibromyalgia, DDD and thyroid disease.   Past Medical History  Diagnosis Date  . Rheumatoid arthritis   . Gastroparesis   . Degenerative disc disease   . Fibromyalgia   . Diverticulitis   . Thyroid disease   . Lupus   . Porphyria    Past Surgical History  Procedure Laterality Date  . Cesarean section    . Replacement total knee Bilateral   . Abdominal hysterectomy    . Cholecystectomy     Family History  Problem Relation Age of Onset  . Leukemia Mother    History  Substance Use Topics  . Smoking status: Never Smoker   . Smokeless tobacco: Not on file  . Alcohol Use: Yes     Comment: occasionally   OB History   Grav Para Term Preterm Abortions TAB SAB Ect Mult Living                  Review of Systems  Constitutional: Positive for fatigue. Negative for fever and chills.  Gastrointestinal: Positive for nausea and abdominal pain. Negative for hematochezia and hematemesis.  Genitourinary: Negative for dysuria and hematuria.      Allergies  Methotrexate derivatives; Morphine and related; and Relafen  Home Medications   Current Outpatient Rx  Name  Route  Sig  Dispense  Refill  . dicyclomine (BENTYL) 20 MG tablet   Oral   Take 20 mg by mouth every 6 (six) hours as needed (for abdominal cramps).         . gabapentin (NEURONTIN) 600 MG tablet   Oral   Take 600 mg by mouth 3 (three) times daily.         [name] levothyroxine (SYNTHROID, LEVOTHROID) 25 MCG tablet   Oral   Take 25 mcg by mouth daily before breakfast. Take with 373mcg to = 367mcg total         . levothyroxine (SYNTHROID, LEVOTHROID) 300 MCG tablet   Oral   Take 300 mcg by mouth daily before breakfast. Take with a 68mcg tablet to = 366mcg         . metoCLOPramide (REGLAN) 10 MG tablet   Oral   Take 10 mg by mouth 4 (four) times daily. Patient takes it everyday per patient         .  naproxen sodium (ANAPROX) 220 MG tablet   Oral   Take 220 mg by mouth 2 (two) times daily as needed.         [name] omeprazole (PRILOSEC) 20 MG capsule   Oral   Take 1 capsule (20 mg total) by mouth 2 (two) times daily before a meal.   60 capsule   0   . ondansetron (ZOFRAN ODT) 4 MG disintegrating tablet   Oral   Take 1 tablet (4 mg total) by mouth every 8 (eight) hours as needed for nausea or vomiting.   10 tablet   0   . oxyCODONE (ROXICODONE) 5 MG immediate release tablet   Oral   Take 1 tablet (5 mg total) by mouth every 6 (six) hours as needed for severe pain.   12 tablet   0   . promethazine (PHENERGAN) 25 MG suppository   Rectal   Place 1 suppository (25 mg total) rectally every 6 (six) hours as needed for nausea or vomiting.   10 each   0   . promethazine (PHENERGAN) 25 MG tablet    Oral   Take 25 mg by mouth every 6 (six) hours as needed for nausea.         . tizanidine (ZANAFLEX) 2 MG capsule   Oral   Take 4 mg by mouth 3 (three) times daily as needed for muscle spasms.           BP 120/96  Pulse 88  Temp(Src) 97.8 F (36.6 C) (Oral)  Resp 18  SpO2 97% Physical Exam  Nursing note and vitals reviewed. Constitutional: She is oriented to person, place, and time. She appears well-developed and well-nourished.  HENT:  Head: Normocephalic and atraumatic.  Eyes: Conjunctivae and EOM are normal.  Neck: Normal range of motion. Neck supple. No JVD present. No tracheal deviation present. No thyromegaly present.  Cardiovascular: Normal rate, regular rhythm and normal heart sounds.   Pulmonary/Chest: Effort normal and breath sounds normal.  Abdominal: Soft. Bowel sounds are normal. There is tenderness in the left lower quadrant. There is no rigidity, no rebound and no guarding.  Musculoskeletal: Normal range of motion.  Lymphadenopathy:    She has no cervical adenopathy.  Neurological: She is alert and oriented to person, place, and time.  Skin: Skin is warm and dry.  Psychiatric: She has a normal mood and affect. Her behavior is normal. Judgment and thought content normal.    ED Course  Procedures (including critical care time) Labs Review Labs Reviewed  CBC WITH DIFFERENTIAL  COMPREHENSIVE METABOLIC PANEL  LIPASE, BLOOD   Imaging Review No results found.  EKG Interpretation   None      Angiocath insertion Performed by: Elisha Headland  Consent: Verbal consent obtained. Risks and benefits: risks, benefits and alternatives were discussed Time out: Immediately prior to procedure a "time out" was called to verify the correct patient, procedure, equipment, support staff and site/side marked as required.  Preparation: Patient was prepped and draped in the usual sterile fashion.  Vein Location: EJ  YES Ultrasound Guided  Gauge: #[id number]  Normal blood  return and flush without difficulty Patient tolerance: Patient tolerated the procedure well with no immediate complications.    MDM   Final diagnoses:  Abdominal pain   Afebrile. Chronic abdominal pain, LLQ. Pt reports that she has not been drinking at home. She reports that she has not had an appetite and hasn't felt like drinking anything. IV NS 1 liter bolus given here with  zofran and dilaudid. Pt feeling better after fluids. No leukocytosis. No concern for surgical abdomen, reassuring exam. She missed recent GI appt due to inclement weather and has called to reschedule. Pt advised to follow-up with GI. Discussed plan of care and the importance of oral hydration. Pt voices understanding. Return precautions given.      Elisha Headland, NP 04/09/13 1148

## 2013-04-07 NOTE — Discharge Instructions (Signed)
Abdominal Pain, Adult Many things can cause abdominal pain. Usually, abdominal pain is not caused by a disease and will improve without treatment. It can often be observed and treated at home. Your health care provider will do a physical exam and possibly order blood tests and X-rays to help determine the seriousness of your pain. However, in many cases, more time must pass before a clear cause of the pain can be found. Before that point, your health care provider may not know if you need more testing or further treatment. HOME CARE INSTRUCTIONS  Monitor your abdominal pain for any changes. The following actions may help to alleviate any discomfort you are experiencing:  Only take over-the-counter or prescription medicines as directed by your health care provider.  Do not take laxatives unless directed to do so by your health care provider.  Try a clear liquid diet (broth, tea, or water) as directed by your health care provider. Slowly move to a bland diet as tolerated. SEEK MEDICAL CARE IF:  You have unexplained abdominal pain.  You have abdominal pain associated with nausea or diarrhea.  You have pain when you urinate or have a bowel movement.  You experience abdominal pain that wakes you in the night.  You have abdominal pain that is worsened or improved by eating food.  You have abdominal pain that is worsened with eating fatty foods. SEEK IMMEDIATE MEDICAL CARE IF:   Your pain does not go away within 2 hours.  You have a fever.  You keep throwing up (vomiting).  Your pain is felt only in portions of the abdomen, such as the right side or the left lower portion of the abdomen.  You pass bloody or black tarry stools. MAKE SURE YOU:  Understand these instructions.   Will watch your condition.   Will get help right away if you are not doing well or get worse.  Document Released: 11/08/2004 Document Revised: 11/19/2012 Document Reviewed: 10/08/2012 [facility] Patient  Information 2014 [place].    Follow-up with GI as soon as possible zofran or phenergan suppositories for nausea Oxycodone as prescribed for mod-severe pain DRINK Oral fluids, sips, ice chips, etc.

## 2013-04-07 NOTE — ED Notes (Signed)
Pt from home with c/o of nausea, lack of appetite and thirst for the past week; diarrhea started a "couple" of days ago; LLQ pain started last night.  Pt denies vomiting.  Pt in NAD, A&O.

## 2013-04-07 NOTE — ED Notes (Signed)
Unsuccessful IV attempt x2. IV team paged. Patient made aware of delays with IV team.

## 2013-04-08 ENCOUNTER — Ambulatory Visit: Payer: Self-pay | Admitting: Pain Medicine

## 2013-04-10 NOTE — ED Provider Notes (Signed)
Medical screening examination/treatment/procedure(s) were performed by non-physician practitioner and as supervising physician I was immediately available for consultation/collaboration.  EKG Interpretation   None         Charles B. Karle Starch, MD 04/10/13 [phone]

## 2013-04-29 ENCOUNTER — Ambulatory Visit: Payer: Self-pay | Admitting: Neurosurgery

## 2013-05-02 ENCOUNTER — Encounter (HOSPITAL_COMMUNITY): Payer: Self-pay | Admitting: Emergency Medicine

## 2013-05-02 ENCOUNTER — Emergency Department (HOSPITAL_COMMUNITY)
Admission: EM | Admit: 2013-05-02 | Discharge: 2013-05-03 | Disposition: A | Discharge status: Home / Self Care | Payer: Medicare Other | Attending: Emergency Medicine | Admitting: Emergency Medicine

## 2013-05-02 DIAGNOSIS — R112 Nausea with vomiting, unspecified: Secondary | ICD-10-CM | POA: Insufficient documentation

## 2013-05-02 DIAGNOSIS — Y9289 Other specified places as the place of occurrence of the external cause: Secondary | ICD-10-CM | POA: Insufficient documentation

## 2013-05-02 DIAGNOSIS — M549 Dorsalgia, unspecified: Secondary | ICD-10-CM | POA: Insufficient documentation

## 2013-05-02 DIAGNOSIS — Z79899 Other long term (current) drug therapy: Secondary | ICD-10-CM | POA: Insufficient documentation

## 2013-05-02 DIAGNOSIS — G8929 Other chronic pain: Secondary | ICD-10-CM | POA: Insufficient documentation

## 2013-05-02 DIAGNOSIS — R197 Diarrhea, unspecified: Secondary | ICD-10-CM | POA: Insufficient documentation

## 2013-05-02 DIAGNOSIS — Y939 Activity, unspecified: Secondary | ICD-10-CM | POA: Insufficient documentation

## 2013-05-02 DIAGNOSIS — R296 Repeated falls: Secondary | ICD-10-CM | POA: Insufficient documentation

## 2013-05-02 DIAGNOSIS — R51 Headache: Secondary | ICD-10-CM | POA: Insufficient documentation

## 2013-05-02 DIAGNOSIS — E079 Disorder of thyroid, unspecified: Secondary | ICD-10-CM | POA: Insufficient documentation

## 2013-05-02 DIAGNOSIS — M069 Rheumatoid arthritis, unspecified: Secondary | ICD-10-CM | POA: Insufficient documentation

## 2013-05-02 DIAGNOSIS — IMO0002 Reserved for concepts with insufficient information to code with codable children: Secondary | ICD-10-CM | POA: Insufficient documentation

## 2013-05-02 DIAGNOSIS — S0993XA Unspecified injury of face, initial encounter: Secondary | ICD-10-CM | POA: Insufficient documentation

## 2013-05-02 DIAGNOSIS — R109 Unspecified abdominal pain: Secondary | ICD-10-CM | POA: Insufficient documentation

## 2013-05-02 DIAGNOSIS — Z792 Long term (current) use of antibiotics: Secondary | ICD-10-CM | POA: Insufficient documentation

## 2013-05-02 DIAGNOSIS — Z8719 Personal history of other diseases of the digestive system: Secondary | ICD-10-CM | POA: Insufficient documentation

## 2013-05-02 DIAGNOSIS — S199XXA Unspecified injury of neck, initial encounter: Secondary | ICD-10-CM

## 2013-05-02 DIAGNOSIS — R404 Transient alteration of awareness: Secondary | ICD-10-CM | POA: Insufficient documentation

## 2013-05-02 LAB — COMPREHENSIVE METABOLIC PANEL
ALT: 63 U/L — ABNORMAL HIGH (ref 0–35)
AST: 87 U/L — AB (ref 0–37)
Albumin: 4.6 g/dL (ref 3.5–5.2)
Alkaline Phosphatase: 106 U/L (ref 39–117)
BUN: 14 mg/dL (ref 6–23)
CO2: 20 mEq/L (ref 19–32)
Calcium: 9.5 mg/dL (ref 8.4–10.5)
Chloride: 99 mEq/L (ref 96–112)
Creatinine, Ser: 0.99 mg/dL (ref 0.50–1.10)
GFR calc Af Amer: 76 mL/min — ABNORMAL LOW (ref >90)
GFR calc non Af Amer: 66 mL/min — ABNORMAL LOW (ref >90)
Glucose, Bld: 82 mg/dL (ref 70–99)
Potassium: 3.7 mEq/L (ref 3.7–5.3)
Sodium: 138 mEq/L (ref 137–147)
TOTAL PROTEIN: 8.6 g/dL — AB (ref 6.0–8.3)
Total Bilirubin: 0.6 mg/dL (ref 0.3–1.2)

## 2013-05-02 LAB — CBC WITH DIFFERENTIAL/PLATELET
BASOS ABS: 0 10*3/uL (ref 0.0–0.1)
BASOS PCT: 1 % (ref 0–1)
EOS ABS: 0.1 10*3/uL (ref 0.0–0.7)
Eosinophils Relative: 2 % (ref 0–5)
HEMATOCRIT: 38.6 % (ref 36.0–46.0)
Hemoglobin: 13.6 g/dL (ref 12.0–15.0)
Lymphocytes Relative: 37 % (ref 12–46)
Lymphs Abs: 2.1 10*3/uL (ref 0.7–4.0)
MCH: 32.2 pg (ref 26.0–34.0)
MCHC: 35.2 g/dL (ref 30.0–36.0)
MCV: 91.3 fL (ref 78.0–100.0)
MONO ABS: 0.3 10*3/uL (ref 0.1–1.0)
Monocytes Relative: 6 % (ref 3–12)
NEUTROS ABS: 3.1 10*3/uL (ref 1.7–7.7)
Neutrophils Relative %: 55 % (ref 43–77)
Platelets: 213 10*3/uL (ref 150–400)
RBC: 4.23 MIL/uL (ref 3.87–5.11)
RDW: 16.4 % — AB (ref 11.5–15.5)
WBC: 5.7 10*3/uL (ref 4.0–10.5)

## 2013-05-02 LAB — LIPASE, BLOOD: Lipase: 16 U/L (ref 11–59)

## 2013-05-02 MED ORDER — SODIUM CHLORIDE 0.9 % IV BOLUS (SEPSIS): 1000.0000 mL | Freq: Once | INTRAVENOUS | Status: DC

## 2013-05-02 MED ORDER — HYDROMORPHONE HCL PF 1 MG/ML IJ SOLN
1.0000 mg | Freq: Once | INTRAMUSCULAR | Status: AC
  Administered 2013-05-03: 1 mg via INTRAVENOUS
  Filled 2013-05-02: qty 1

## 2013-05-02 MED ORDER — HYDROMORPHONE HCL PF 1 MG/ML IJ SOLN
1.0000 mg | Freq: Once | INTRAMUSCULAR | Status: AC
  Administered 2013-05-02: 1 mg via INTRAVENOUS
  Filled 2013-05-02: qty 1

## 2013-05-02 MED ORDER — ONDANSETRON HCL 4 MG/2ML IJ SOLN
4.0000 mg | Freq: Once | INTRAMUSCULAR | Status: AC
  Administered 2013-05-02: 4 mg via INTRAVENOUS
  Filled 2013-05-02: qty 2

## 2013-05-02 MED ORDER — SODIUM CHLORIDE 0.9 % IV BOLUS (SEPSIS)
1000.0000 mL | Freq: Once | INTRAVENOUS | Status: AC
  Administered 2013-05-02: 1000 mL via INTRAVENOUS

## 2013-05-02 NOTE — ED Notes (Signed)
Pt c/o IV in left chest stinging. IV team attempting to start new IV

## 2013-05-02 NOTE — ED Provider Notes (Signed)
  Face-to-face evaluation   History: Recurrent episodes of abdominal pain, last present for 3 days with anorexia and nausea. She is using her home pain medicines. 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 She is trying to get it up with GI to see them for her ongoing pain.  Physical exam: Alert, obese patient, who is tearful, but cooperative.  Medical screening examination/treatment/procedure(s) were conducted as a shared visit with non-physician practitioner(s) and myself.  I personally evaluated the patient during the encounter  Richarda Blade, MD 05/03/13 [phone]

## 2013-05-02 NOTE — ED Notes (Signed)
IV team started new IV in pt left foot.

## 2013-05-02 NOTE — ED Provider Notes (Signed)
CSN: [id number]     Arrival date & time 05/02/13  1620 History   First MD Initiated Contact with Patient 05/02/13 2008     Chief Complaint  Patient presents with  . Headache  . Abdominal Pain  . Nausea  . Diarrhea  . Loss of Consciousness     (Consider location/radiation/quality/duration/timing/severity/associated sxs/prior Treatment) HPI Comments: Patient with history of chronic abdominal pain, question of porphyria, question of lupus -- presents with worsening lower abdominal pain, nausea, vomiting, diarrhea for the past 3 days. She denies fever, URI symptoms, chest pain, shortness of breath. No urinary symptoms. Patient states she's been unable to keep down fluids or solids. Patient has had similar symptoms in the past and she has had 13 visits in the past 6 months for similar complaints.  In addition patient states that she fell at her couch a couple of nights ago. Since that time her chronic neck pain has been worse than usual. She denies new weakness, numbness, or tingling of her upper or lower extremities. No red flag signs and symptoms of back pain.   The history is provided by the patient and medical records.    Past Medical History  Diagnosis Date  . Rheumatoid arthritis   . Gastroparesis   . Degenerative disc disease   . Fibromyalgia   . Diverticulitis   . Thyroid disease   . Lupus   . Porphyria    Past Surgical History  Procedure Laterality Date  . Cesarean section    . Replacement total knee Bilateral   . Abdominal hysterectomy    . Cholecystectomy     Family History  Problem Relation Age of Onset  . Leukemia Mother    History  Substance Use Topics  . Smoking status: Never Smoker   . Smokeless tobacco: Not on file  . Alcohol Use: Yes     Comment: social   OB History   Grav Para Term Preterm Abortions TAB SAB Ect Mult Living                 Review of Systems  Constitutional: Negative for fever and unexpected weight change.  HENT: Negative for  rhinorrhea and sore throat.   Eyes: Negative for redness.  Respiratory: Negative for cough.   Cardiovascular: Negative for chest pain.  Gastrointestinal: Positive for nausea, vomiting, abdominal pain and diarrhea. Negative for constipation and blood in stool.       Negative for fecal incontinence.   Genitourinary: Negative for dysuria, hematuria, flank pain, vaginal bleeding, vaginal discharge and pelvic pain.       Negative for urinary incontinence or retention.  Musculoskeletal: Positive for back pain (chronic) and neck pain (chronic). Negative for myalgias.  Skin: Negative for rash.  Neurological: Negative for weakness, numbness and headaches.       Denies saddle paresthesias.      Allergies  Methotrexate derivatives; Morphine and related; and Relafen  Home Medications   Current Outpatient Rx  Name  Route  Sig  Dispense  Refill  . diazepam (VALIUM) 5 MG tablet   Oral   Take 5 mg by mouth 2 (two) times daily as needed (neck pain).          [name] gabapentin (NEURONTIN) 600 MG tablet   Oral   Take 600 mg by mouth 3 (three) times daily.         [name] levothyroxine (SYNTHROID, LEVOTHROID) 25 MCG tablet   Oral   Take 25 mcg by mouth daily before breakfast. Take  with 378mcg to = 340mcg total         . levothyroxine (SYNTHROID, LEVOTHROID) 300 MCG tablet   Oral   Take 300 mcg by mouth daily before breakfast. Take with a 47mcg tablet to = 360mcg         . metoCLOPramide (REGLAN) 10 MG tablet   Oral   Take 10 mg by mouth 4 (four) times daily.          . naproxen sodium (ALEVE) 220 MG tablet   Oral   Take 440 mg by mouth daily as needed (arthritis pain).         [name] omeprazole (PRILOSEC) 20 MG capsule   Oral   Take 1 capsule (20 mg total) by mouth 2 (two) times daily before a meal.   60 capsule   0   . ondansetron (ZOFRAN ODT) 4 MG disintegrating tablet   Oral   Take 1 tablet (4 mg total) by mouth every 8 (eight) hours as needed for nausea or vomiting.   10 tablet    0   . oxyCODONE (OXY IR/ROXICODONE) 5 MG immediate release tablet   Oral   Take 5 mg by mouth every 4 (four) hours as needed for severe pain.         . promethazine (PHENERGAN) 25 MG tablet   Oral   Take 25 mg by mouth 2 (two) times daily as needed for nausea.          . tizanidine (ZANAFLEX) 2 MG capsule   Oral   Take 4 mg by mouth 3 (three) times daily as needed for muscle spasms.          . cephALEXin (KEFLEX) 500 MG capsule   Oral   Take 500 mg by mouth 3 (three) times daily.           BP 123/88  Pulse 78  Temp(Src) 98.4 F (36.9 C) (Oral)  Resp 22  Ht 5\' 1"  (1.549 m)  Wt 186 lb (84.369 kg)  BMI 35.16 kg/m2  SpO2 98%  Physical Exam  Nursing note and vitals reviewed. Constitutional: She appears well-developed and well-nourished.  HENT:  Head: Normocephalic and atraumatic.  Mouth/Throat: Oropharynx is clear and moist. No oropharyngeal exudate.  Eyes: Conjunctivae are normal. Right eye exhibits no discharge. Left eye exhibits no discharge.  Neck: Normal range of motion. Neck supple.  Cardiovascular: Normal rate, regular rhythm and normal heart sounds.   Pulmonary/Chest: Effort normal and breath sounds normal.  Abdominal: Soft. Bowel sounds are normal. She exhibits no distension. There is tenderness. There is no rebound, no guarding and no CVA tenderness.  Mild lower abdominal pain bilaterally.  Musculoskeletal: Normal range of motion.  No step-off noted with palpation of spine.   Neurological: She is alert. She has normal strength and normal reflexes. No sensory deficit.  5/5 strength in entire lower extremities bilaterally. No sensation deficit.   Skin: Skin is warm and dry. No rash noted.  Psychiatric: She has a normal mood and affect.    ED Course  Procedures (including critical care time) Labs Review Labs Reviewed  CBC WITH DIFFERENTIAL - Abnormal; Notable for the following:    RDW 16.4 (*)    All other components within normal limits   COMPREHENSIVE METABOLIC PANEL - Abnormal; Notable for the following:    Total Protein 8.6 (*)    AST 87 (*)    ALT 63 (*)    GFR calc non Af Amer 66 (*)  GFR calc Af Amer 76 (*)    All other components within normal limits  URINALYSIS, ROUTINE W REFLEX MICROSCOPIC - Abnormal; Notable for the following:    APPearance TURBID (*)    Hgb urine dipstick SMALL (*)    Leukocytes, UA LARGE (*)    All other components within normal limits  URINE MICROSCOPIC-ADD ON - Abnormal; Notable for the following:    Squamous Epithelial / LPF MANY (*)    Bacteria, UA FEW (*)    All other components within normal limits  URINE CULTURE  LIPASE, BLOOD   Imaging Review No results found.   EKG Interpretation None      8:22 PM Patient seen and examined. Work-up initiated. Medications ordered.   Vital signs reviewed and are as follows: Filed Vitals:   05/02/13 1945  BP: 123/88  Pulse: 78  Temp:   Resp: 22   11:24 PM IV team had to place IV in foot. She is currently getting fluids. States she is feeling a bit better after IV pain and nausea medications.No additional vomiting and diarrhea. I brought her a cup of ice as PO challenge.   12:46 AM Patient has received fluids pain medicine and antiemetics. Patient d/w and seen by Dr. Eulis Foster. No indications for admission.   The patient was urged to return to the Emergency Department immediately with worsening of current symptoms, worsening abdominal pain, persistent vomiting, blood noted in stools, fever, or any other concerns. The patient verbalized understanding.    MDM   Final diagnoses:  Abdominal pain   Patient with history of chronic abdominal pain and multiple episodes of nausea, vomiting, and diarrhea. Patient has been seen in Emergency Department 13 times in the past 6 months, multiple times for similar symptoms. Symptoms today are similar to past. Labs are reassuring. Symptoms are improved after hydration, antiemetics and pain medicine.  Patient appears nontoxic. Abdomen is soft. No concern for ACS.   No dangerous or life-threatening conditions suspected or identified by history, physical exam, and by work-up. No indications for hospitalization identified.       Carlisle Cater, PA-C 05/03/13 [phone]

## 2013-05-02 NOTE — ED Notes (Signed)
The patient is unable to give urine specimen at this time. The patient has been advised to use call light for assistance to the restroom. The tech has reported to the RN in charge. 

## 2013-05-02 NOTE — ED Notes (Signed)
IV team paged.  

## 2013-05-02 NOTE — ED Notes (Signed)
The patient is still unable to void. The tech has reported to the RN in charge.

## 2013-05-02 NOTE — ED Notes (Signed)
Pt presents to department for evaluation of multiple complaints. Reports chronic neck pain, abdominal pain, nausea/diarrhea, and near syncope. Pt reports waking up on floor next to couch. 8/10 pain at the time. Pt is alert and oriented x4. No signs of distress noted.

## 2013-05-03 ENCOUNTER — Emergency Department: Payer: Self-pay | Admitting: Emergency Medicine

## 2013-05-03 LAB — URINALYSIS, ROUTINE W REFLEX MICROSCOPIC
Bilirubin Urine: NEGATIVE
Glucose, UA: NEGATIVE mg/dL
Ketones, ur: NEGATIVE mg/dL
Nitrite: NEGATIVE
Protein, ur: NEGATIVE mg/dL
SPECIFIC GRAVITY, URINE: 1.01 (ref 1.005–1.030)
Urobilinogen, UA: 0.2 mg/dL (ref 0.0–1.0)
pH: 6 (ref 5.0–8.0)

## 2013-05-03 LAB — URINE MICROSCOPIC-ADD ON

## 2013-05-03 MED ORDER — METHOCARBAMOL 500 MG PO TABS: 1000.0000 mg | ORAL_TABLET | Freq: Four times a day (QID) | ORAL | Status: DC

## 2013-05-03 MED ORDER — METOCLOPRAMIDE HCL 10 MG PO TABS: 10.0000 mg | ORAL_TABLET | Freq: Four times a day (QID) | ORAL | Status: DC

## 2013-05-03 MED ORDER — ONDANSETRON HCL 4 MG/2ML IJ SOLN
4.0000 mg | Freq: Once | INTRAMUSCULAR | Status: AC
  Administered 2013-05-03: 4 mg via INTRAVENOUS
  Filled 2013-05-03: qty 2

## 2013-05-03 NOTE — Discharge Instructions (Signed)
Please read and follow all provided instructions.  Your diagnoses today include:  1. Abdominal pain    Tests performed today include:  Blood counts and electrolytes - normal  Blood tests to check liver and kidney function  Blood tests to check pancreas function  Urine test to look for infection and pregnancy (in women)  Vital signs. See below for your results today.   Medications prescribed:   Reglan - for nausea and vomiting  Take any prescribed medications only as directed.  Home care instructions:   Follow any educational materials contained in this packet.   Keep drinking plenty of fluids and use the medicine for nausea as directed.    Drink clear liquids for the next 24 hours and introduce solid foods slowly after 24 hours using the b.r.a.t. diet (Bananas, Rice, Applesauce, Toast, Yogurt).     Follow-up instructions: Please follow-up with your primary care provider in the next 2 days for further evaluation of your symptoms. If you are not feeling better in 48 hours you may have a condition that is more serious and you need re-evaluation. If you do not have a primary care doctor -- see below for referral information.   Return instructions:  SEEK IMMEDIATE MEDICAL ATTENTION IF:  If you have pain that does not go away or becomes severe   A temperature above 101F develops   Repeated vomiting occurs (multiple episodes)   If you have pain that becomes localized to portions of the abdomen. The right side could possibly be appendicitis. In an adult, the left lower portion of the abdomen could be colitis or diverticulitis.   Blood is being passed in stools or vomit (bright red or black tarry stools)   You develop chest pain, difficulty breathing, dizziness or fainting, or become confused, poorly responsive, or inconsolable (young children)  If you have any other emergent concerns regarding your health  Additional Information: Abdominal (belly) pain can be caused by  many things. Your caregiver performed an examination and possibly ordered blood/urine tests and imaging (CT scan, x-rays, ultrasound). Many cases can be observed and treated at home after initial evaluation in the emergency department. Even though you are being discharged home, abdominal pain can be unpredictable. Therefore, you need a repeated exam if your pain does not resolve, returns, or worsens. Most patients with abdominal pain don't have to be admitted to the hospital or have surgery, but serious problems like appendicitis and gallbladder attacks can start out as nonspecific pain. Many abdominal conditions cannot be diagnosed in one visit, so follow-up evaluations are very important.  Your vital signs today were: BP 120/81   Pulse 99   Temp(Src) 98.4 F (36.9 C) (Oral)   Resp 20   Ht 5\' 1"  (1.549 m)   Wt 186 lb (84.369 kg)   BMI 35.16 kg/m2   SpO2 98% If your blood pressure (bp) was elevated above 135/85 this visit, please have this repeated by your doctor within one month. --------------

## 2013-05-04 LAB — CBC WITH DIFFERENTIAL/PLATELET
BASOS ABS: 0.1 10*3/uL (ref 0.0–0.1)
Basophil %: 1.3 %
EOS ABS: 0.2 10*3/uL (ref 0.0–0.7)
Eosinophil %: 2.8 %
HCT: 33 % — ABNORMAL LOW (ref 35.0–47.0)
HGB: 11.3 g/dL — ABNORMAL LOW (ref 12.0–16.0)
LYMPHS PCT: 35.8 %
Lymphocyte #: 2 10*3/uL (ref 1.0–3.6)
MCH: 31.4 pg (ref 26.0–34.0)
MCHC: 34.3 g/dL (ref 32.0–36.0)
MCV: 92 fL (ref 80–100)
Monocyte #: 0.4 x10 3/mm (ref 0.2–0.9)
Monocyte %: 6.5 %
Neutrophil #: 2.9 10*3/uL (ref 1.4–6.5)
Neutrophil %: 53.6 %
Platelet: 201 10*3/uL (ref 150–440)
RBC: 3.6 10*6/uL — AB (ref 3.80–5.20)
RDW: 16.9 % — ABNORMAL HIGH (ref 11.5–14.5)
WBC: 5.5 10*3/uL (ref 3.6–11.0)

## 2013-05-04 LAB — URINALYSIS, COMPLETE
BACTERIA: NONE SEEN
Bilirubin,UR: NEGATIVE
Blood: NEGATIVE
Glucose,UR: NEGATIVE mg/dL (ref 0–75)
Ketone: NEGATIVE
Nitrite: NEGATIVE
Ph: 6 (ref 4.5–8.0)
Protein: NEGATIVE
SPECIFIC GRAVITY: 1.01 (ref 1.003–1.030)
WBC UR: 2 /HPF (ref 0–5)

## 2013-05-04 LAB — URINE CULTURE: Colony Count: 2000

## 2013-05-04 LAB — LIPASE, BLOOD: LIPASE: 137 U/L (ref 73–393)

## 2013-05-04 LAB — COMPREHENSIVE METABOLIC PANEL
ALBUMIN: 3.8 g/dL (ref 3.4–5.0)
ANION GAP: 6 — AB (ref 7–16)
Alkaline Phosphatase: 99 U/L
BUN: 11 mg/dL (ref 7–18)
Bilirubin,Total: 0.5 mg/dL (ref 0.2–1.0)
CALCIUM: 8.5 mg/dL (ref 8.5–10.1)
CHLORIDE: 111 mmol/L — AB (ref 98–107)
CREATININE: 1.21 mg/dL (ref 0.60–1.30)
Co2: 24 mmol/L (ref 21–32)
EGFR (African American): >60
EGFR (Non-African Amer.): 52 — ABNORMAL LOW
GLUCOSE: 79 mg/dL (ref 65–99)
Osmolality: 280 (ref 275–301)
POTASSIUM: 3.1 mmol/L — AB (ref 3.5–5.1)
SGOT(AST): 67 U/L — ABNORMAL HIGH (ref 15–37)
SGPT (ALT): 59 U/L (ref 12–78)
Sodium: 141 mmol/L (ref 136–145)
TOTAL PROTEIN: 7.3 g/dL (ref 6.4–8.2)

## 2013-05-04 LAB — TROPONIN I

## 2013-05-06 ENCOUNTER — Encounter (HOSPITAL_COMMUNITY): Payer: Self-pay | Admitting: Emergency Medicine

## 2013-05-06 ENCOUNTER — Emergency Department (HOSPITAL_COMMUNITY)
Admission: EM | Admit: 2013-05-06 | Discharge: 2013-05-06 | Disposition: A | Discharge status: Home / Self Care | Payer: Medicare Other | Attending: Emergency Medicine | Admitting: Emergency Medicine

## 2013-05-06 DIAGNOSIS — Z8739 Personal history of other diseases of the musculoskeletal system and connective tissue: Secondary | ICD-10-CM | POA: Insufficient documentation

## 2013-05-06 DIAGNOSIS — M069 Rheumatoid arthritis, unspecified: Secondary | ICD-10-CM | POA: Insufficient documentation

## 2013-05-06 DIAGNOSIS — R1084 Generalized abdominal pain: Secondary | ICD-10-CM | POA: Insufficient documentation

## 2013-05-06 DIAGNOSIS — R197 Diarrhea, unspecified: Secondary | ICD-10-CM

## 2013-05-06 DIAGNOSIS — R112 Nausea with vomiting, unspecified: Secondary | ICD-10-CM

## 2013-05-06 DIAGNOSIS — E079 Disorder of thyroid, unspecified: Secondary | ICD-10-CM | POA: Insufficient documentation

## 2013-05-06 DIAGNOSIS — Z79899 Other long term (current) drug therapy: Secondary | ICD-10-CM | POA: Insufficient documentation

## 2013-05-06 DIAGNOSIS — R109 Unspecified abdominal pain: Secondary | ICD-10-CM

## 2013-05-06 LAB — URINALYSIS, ROUTINE W REFLEX MICROSCOPIC
Bilirubin Urine: NEGATIVE
Glucose, UA: NEGATIVE mg/dL
Hgb urine dipstick: NEGATIVE
Ketones, ur: NEGATIVE mg/dL
Nitrite: NEGATIVE
PROTEIN: NEGATIVE mg/dL
Specific Gravity, Urine: 1.007 (ref 1.005–1.030)
UROBILINOGEN UA: 0.2 mg/dL (ref 0.0–1.0)
pH: 6.5 (ref 5.0–8.0)

## 2013-05-06 LAB — I-STAT CHEM 8, ED
BUN: 6 mg/dL (ref 6–23)
CREATININE: 1.2 mg/dL — AB (ref 0.50–1.10)
Calcium, Ion: 1.08 mmol/L — ABNORMAL LOW (ref 1.12–1.23)
Chloride: 108 mEq/L (ref 96–112)
Glucose, Bld: 95 mg/dL (ref 70–99)
HCT: 39 % (ref 36.0–46.0)
HEMOGLOBIN: 13.3 g/dL (ref 12.0–15.0)
POTASSIUM: 3.5 meq/L — AB (ref 3.7–5.3)
Sodium: 144 mEq/L (ref 137–147)
TCO2: 22 mmol/L (ref 0–100)

## 2013-05-06 LAB — URINE MICROSCOPIC-ADD ON

## 2013-05-06 MED ORDER — SODIUM CHLORIDE 0.9 % IV BOLUS (SEPSIS)
1000.0000 mL | Freq: Once | INTRAVENOUS | Status: AC
  Administered 2013-05-06: 1000 mL via INTRAVENOUS

## 2013-05-06 MED ORDER — HYDROMORPHONE HCL PF 1 MG/ML IJ SOLN
1.0000 mg | Freq: Once | INTRAMUSCULAR | Status: AC
  Administered 2013-05-06: 1 mg via INTRAVENOUS
  Filled 2013-05-06: qty 1

## 2013-05-06 MED ORDER — FENTANYL CITRATE 0.05 MG/ML IJ SOLN
50.0000 ug | Freq: Once | INTRAMUSCULAR | Status: AC
  Administered 2013-05-06: 50 ug via INTRAVENOUS
  Filled 2013-05-06: qty 2

## 2013-05-06 MED ORDER — HYDROMORPHONE HCL PF 1 MG/ML IJ SOLN
0.5000 mg | Freq: Once | INTRAMUSCULAR | Status: AC
  Administered 2013-05-06: 0.5 mg via INTRAVENOUS
  Filled 2013-05-06: qty 1

## 2013-05-06 MED ORDER — DICYCLOMINE HCL 10 MG/ML IM SOLN
20.0000 mg | Freq: Once | INTRAMUSCULAR | Status: AC
  Administered 2013-05-06: 20 mg via INTRAMUSCULAR
  Filled 2013-05-06: qty 2

## 2013-05-06 MED ORDER — ONDANSETRON HCL 4 MG/2ML IJ SOLN
4.0000 mg | Freq: Once | INTRAMUSCULAR | Status: AC
  Administered 2013-05-06: 4 mg via INTRAVENOUS
  Filled 2013-05-06: qty 2

## 2013-05-06 MED ORDER — FAMOTIDINE IN NACL 20-0.9 MG/50ML-% IV SOLN
20.0000 mg | Freq: Once | INTRAVENOUS | Status: AC
  Administered 2013-05-06: 20 mg via INTRAVENOUS
  Filled 2013-05-06: qty 50

## 2013-05-06 NOTE — ED Notes (Signed)
IV team will plan to start IV.

## 2013-05-06 NOTE — ED Notes (Signed)
Ice chips and saltines given after meds--

## 2013-05-06 NOTE — ED Notes (Signed)
Reported to Dr. Cheri Guppy that the patient still reports having pain, and that the Fentanyl is not helping.  MD orders and gives verbal order for 1mg  of Dilaudid.

## 2013-05-06 NOTE — ED Notes (Signed)
Ambulated to BR without difficulty-- redness noted in upper right arm-- states that cat has scratched her on that same arm, no open wounds noted

## 2013-05-06 NOTE — ED Notes (Signed)
Introduced self to patient and discussed plan of care as patient is anxious.

## 2013-05-06 NOTE — ED Notes (Signed)
Eating crackers and taking ice chips slowly-- seen by Dr. Leonides Schanz

## 2013-05-06 NOTE — ED Notes (Signed)
Pt dc to home. Pt sts understanding to dc instructions. Pt ambulatory to exit without difficulty.  Pt denies need for w/c.  

## 2013-05-06 NOTE — Discharge Instructions (Signed)
Abdominal Pain, Adult °Many things can cause abdominal pain. Usually, abdominal pain is not caused by a disease and will improve without treatment. It can often be observed and treated at home. Your health care provider will do a physical exam and possibly order blood tests and X-rays to help determine the seriousness of your pain. However, in many cases, more time must pass before a clear cause of the pain can be found. Before that point, your health care provider may not know if you need more testing or further treatment. °HOME CARE INSTRUCTIONS  °Monitor your abdominal pain for any changes. The following actions may help to alleviate any discomfort you are experiencing: °· Only take over-the-counter or prescription medicines as directed by your health care provider. °· Do not take laxatives unless directed to do so by your health care provider. °· Try a clear liquid diet (broth, tea, or water) as directed by your health care provider. Slowly move to a bland diet as tolerated. °SEEK MEDICAL CARE IF: °· You have unexplained abdominal pain. °· You have abdominal pain associated with nausea or diarrhea. °· You have pain when you urinate or have a bowel movement. °· You experience abdominal pain that wakes you in the night. °· You have abdominal pain that is worsened or improved by eating food. °· You have abdominal pain that is worsened with eating fatty foods. °SEEK IMMEDIATE MEDICAL CARE IF:  °· Your pain does not go away within 2 hours. °· You have a fever. °· You keep throwing up (vomiting). °· Your pain is felt only in portions of the abdomen, such as the right side or the left lower portion of the abdomen. °· You pass bloody or black tarry stools. °MAKE SURE YOU: °· Understand these instructions.   °· Will watch your condition.   °· Will get help right away if you are not doing well or get worse.   °Document Released: 11/08/2004 Document Revised: 11/19/2012 Document Reviewed: 10/08/2012 °[employer]® Patient  Information ©2014 [employer]. ° °

## 2013-05-06 NOTE — ED Provider Notes (Signed)
CSN: [id number]     Arrival date & time 05/06/13  9937 History   First MD Initiated Contact with Patient 05/06/13 [phone]     Chief Complaint  Patient presents with  . Emesis     (Consider location/radiation/quality/duration/timing/severity/associated sxs/prior Treatment) HPI  This patient is an unfortunate middle-aged woman with history of chronic abdominal pain and gastroparesis and a questionable diagnosis of per period. She presents without 24 hours of persistent nausea vomiting and diarrhea. She has diffuse abdominal cramping. She has tried Zofran and Phenergan without relief of her symptoms. She is most troubled by abdominal cramping pain which, she says frequently accompanies episodes of vomiting.  Her pain is cramping, diffuse, 10 over 10 in severity. No fever. Her pain and symptoms are worsened with any by mouth intake. Her by mouth intake has been diminished. She denies any genitourinary symptoms. Urine output has been normal.  Past Medical History  Diagnosis Date  . Rheumatoid arthritis   . Gastroparesis   . Degenerative disc disease   . Fibromyalgia   . Diverticulitis   . Thyroid disease   . Lupus   . Porphyria    Past Surgical History  Procedure Laterality Date  . Cesarean section    . Replacement total knee Bilateral   . Abdominal hysterectomy    . Cholecystectomy     Family History  Problem Relation Age of Onset  . Leukemia Mother    History  Substance Use Topics  . Smoking status: Never Smoker   . Smokeless tobacco: Not on file  . Alcohol Use: Yes     Comment: social   OB History   Grav Para Term Preterm Abortions TAB SAB Ect Mult Living                 Review of Systems Ten point review of symptoms performed and is negative with the exception of symptoms noted above.     Allergies  Methotrexate derivatives; Morphine and related; and Relafen  Home Medications   Current Outpatient Rx  Name  Route  Sig  Dispense  Refill  . gabapentin  (NEURONTIN) 600 MG tablet   Oral   Take 600 mg by mouth 3 (three) times daily.         [name] levothyroxine (SYNTHROID, LEVOTHROID) 25 MCG tablet   Oral   Take 25 mcg by mouth daily before breakfast. Take with 358mcg to = 312mcg total         . levothyroxine (SYNTHROID, LEVOTHROID) 300 MCG tablet   Oral   Take 300 mcg by mouth daily before breakfast. Take with a 65mcg tablet to = 310mcg         . methocarbamol (ROBAXIN) 500 MG tablet   Oral   Take 2 tablets (1,000 mg total) by mouth 4 (four) times daily.   20 tablet   0   . metoCLOPramide (REGLAN) 10 MG tablet   Oral   Take 1 tablet (10 mg total) by mouth 4 (four) times daily.   12 tablet   0   . naproxen sodium (ALEVE) 220 MG tablet   Oral   Take 440 mg by mouth daily as needed (arthritis pain).         [name] omeprazole (PRILOSEC) 20 MG capsule   Oral   Take 1 capsule (20 mg total) by mouth 2 (two) times daily before a meal.   60 capsule   0   . ondansetron (ZOFRAN ODT) 4 MG disintegrating tablet   Oral  Take 1 tablet (4 mg total) by mouth every 8 (eight) hours as needed for nausea or vomiting.   10 tablet   0   . oxyCODONE (OXY IR/ROXICODONE) 5 MG immediate release tablet   Oral   Take 5 mg by mouth every 4 (four) hours as needed for severe pain.         . promethazine (PHENERGAN) 25 MG suppository   Rectal   Place 25 mg rectally every 6 (six) hours as needed for nausea or vomiting.         . promethazine (PHENERGAN) 25 MG tablet   Oral   Take 25 mg by mouth 2 (two) times daily as needed for nausea.          . tizanidine (ZANAFLEX) 2 MG capsule   Oral   Take 4 mg by mouth 3 (three) times daily as needed for muscle spasms.          . cephALEXin (KEFLEX) 500 MG capsule   Oral   Take 500 mg by mouth 3 (three) times daily.           BP 124/90  Pulse 72  Temp(Src) 98.5 F (36.9 C) (Oral)  Resp 18  SpO2 98% Physical Exam Gen: well developed and well nourished appearing, uncomrfortable  appearing, tearful Head: NCAT Eyes: PERL, EOMI Nose: no epistaixis or rhinorrhea Mouth/throat: mucosa is moist and pink Neck: supple, no stridor Lungs: CTA B, no wheezing, rhonchi or rales CV: RRR, no murmur, extremities appear well perfused.  Abd: soft, midlly and diffusely tender without peritoneal signs, nondistended Back: no ttp, no cva ttp Skin: warm and dry Ext: normal to inspection, no dependent edema Neuro: CN ii-xii grossly intact, no focal deficits Psyche; anxious and tearful affect, cooperative.   ED Course  Procedures (including critical care time)    MDM   Patient with 24 hrs of N/V/D and diffuse abdominal pain - history of multiple episodes of similar sx. We will manage symptomatically in hopes of discharge to home with plan for outpatient GI follow up.   0759: Patient is feeling and looking much better and requests po challenge. VS have normalized and repeat abdominal exam is benign. We will po challenge in anticipation of d/c home with plan for close outpatient f/u.   Elyn Peers, MD 05/06/13 [phone]

## 2013-05-06 NOTE — ED Notes (Signed)
IV team paged for 2 failed IV attempts by 2 RN's.

## 2013-05-06 NOTE — ED Notes (Signed)
Pt. reports persistent nausea and vomitting for several days with occasional diarrhea and abdominal cramping unrelieved by prescription Zofran and Phenergan .

## 2013-05-07 ENCOUNTER — Ambulatory Visit: Payer: Self-pay | Admitting: Pain Medicine

## 2013-06-01 ENCOUNTER — Emergency Department (HOSPITAL_COMMUNITY)
Admission: EM | Admit: 2013-06-01 | Discharge: 2013-06-01 | Disposition: A | Discharge status: Home / Self Care | Payer: Medicare Other | Attending: Emergency Medicine | Admitting: Emergency Medicine

## 2013-06-01 ENCOUNTER — Encounter (HOSPITAL_COMMUNITY): Payer: Self-pay | Admitting: Emergency Medicine

## 2013-06-01 DIAGNOSIS — R109 Unspecified abdominal pain: Secondary | ICD-10-CM

## 2013-06-01 DIAGNOSIS — R21 Rash and other nonspecific skin eruption: Secondary | ICD-10-CM | POA: Insufficient documentation

## 2013-06-01 DIAGNOSIS — M1990 Unspecified osteoarthritis, unspecified site: Secondary | ICD-10-CM

## 2013-06-01 DIAGNOSIS — R112 Nausea with vomiting, unspecified: Secondary | ICD-10-CM | POA: Insufficient documentation

## 2013-06-01 DIAGNOSIS — Z79899 Other long term (current) drug therapy: Secondary | ICD-10-CM | POA: Insufficient documentation

## 2013-06-01 DIAGNOSIS — R197 Diarrhea, unspecified: Secondary | ICD-10-CM | POA: Insufficient documentation

## 2013-06-01 DIAGNOSIS — Z8719 Personal history of other diseases of the digestive system: Secondary | ICD-10-CM | POA: Insufficient documentation

## 2013-06-01 DIAGNOSIS — E079 Disorder of thyroid, unspecified: Secondary | ICD-10-CM | POA: Insufficient documentation

## 2013-06-01 DIAGNOSIS — M069 Rheumatoid arthritis, unspecified: Secondary | ICD-10-CM | POA: Insufficient documentation

## 2013-06-01 LAB — CBC WITH DIFFERENTIAL/PLATELET
Basophils Absolute: 0.1 10*3/uL (ref 0.0–0.1)
Basophils Relative: 1 % (ref 0–1)
EOS ABS: 0.2 10*3/uL (ref 0.0–0.7)
Eosinophils Relative: 4 % (ref 0–5)
HEMATOCRIT: 40.2 % (ref 36.0–46.0)
Hemoglobin: 14.1 g/dL (ref 12.0–15.0)
LYMPHS ABS: 1.7 10*3/uL (ref 0.7–4.0)
LYMPHS PCT: 28 % (ref 12–46)
MCH: 32.5 pg (ref 26.0–34.0)
MCHC: 35.1 g/dL (ref 30.0–36.0)
MCV: 92.6 fL (ref 78.0–100.0)
MONO ABS: 0.4 10*3/uL (ref 0.1–1.0)
MONOS PCT: 6 % (ref 3–12)
Neutro Abs: 3.7 10*3/uL (ref 1.7–7.7)
Neutrophils Relative %: 61 % (ref 43–77)
PLATELETS: 237 10*3/uL (ref 150–400)
RBC: 4.34 MIL/uL (ref 3.87–5.11)
RDW: 15.2 % (ref 11.5–15.5)
WBC: 6 10*3/uL (ref 4.0–10.5)

## 2013-06-01 LAB — COMPREHENSIVE METABOLIC PANEL
ALT: 73 U/L — AB (ref 0–35)
AST: 107 U/L — ABNORMAL HIGH (ref 0–37)
Albumin: 4.5 g/dL (ref 3.5–5.2)
Alkaline Phosphatase: 153 U/L — ABNORMAL HIGH (ref 39–117)
BUN: 18 mg/dL (ref 6–23)
CALCIUM: 9.9 mg/dL (ref 8.4–10.5)
CO2: 21 meq/L (ref 19–32)
CREATININE: 1.18 mg/dL — AB (ref 0.50–1.10)
Chloride: 99 mEq/L (ref 96–112)
GFR, EST AFRICAN AMERICAN: 61 mL/min — AB (ref >90)
GFR, EST NON AFRICAN AMERICAN: 53 mL/min — AB (ref >90)
Glucose, Bld: 99 mg/dL (ref 70–99)
Potassium: 3.6 mEq/L — ABNORMAL LOW (ref 3.7–5.3)
Sodium: 139 mEq/L (ref 137–147)
TOTAL PROTEIN: 8.7 g/dL — AB (ref 6.0–8.3)
Total Bilirubin: 0.9 mg/dL (ref 0.3–1.2)

## 2013-06-01 LAB — LIPASE, BLOOD: LIPASE: 11 U/L (ref 11–59)

## 2013-06-01 MED ORDER — ONDANSETRON 4 MG PO TBDP
4.0000 mg | ORAL_TABLET | Freq: Once | ORAL | Status: AC
  Administered 2013-06-01: 4 mg via ORAL
  Filled 2013-06-01: qty 1

## 2013-06-01 MED ORDER — SODIUM CHLORIDE 0.9 % IV BOLUS (SEPSIS): 1000.0000 mL | Freq: Once | INTRAVENOUS | Status: DC

## 2013-06-01 MED ORDER — DIPHENHYDRAMINE HCL 25 MG PO CAPS
25.0000 mg | ORAL_CAPSULE | Freq: Once | ORAL | Status: AC
  Administered 2013-06-01: 25 mg via ORAL
  Filled 2013-06-01: qty 1

## 2013-06-01 MED ORDER — HYDROMORPHONE HCL PF 1 MG/ML IJ SOLN
2.0000 mg | Freq: Once | INTRAMUSCULAR | Status: AC
  Administered 2013-06-01: 2 mg via INTRAMUSCULAR
  Filled 2013-06-01: qty 2

## 2013-06-01 NOTE — ED Provider Notes (Signed)
CSN: [id number]     Arrival date & time 06/01/13  0257 History   First MD Initiated Contact with Patient 06/01/13 0356     Chief Complaint  Patient presents with  . Abdominal Pain  . Diarrhea  . Emesis     (Consider location/radiation/quality/duration/timing/severity/associated sxs/prior Treatment) HPI Patient has a history of 8 years of episodic abdominal pain with vomiting and diarrhea. She's had a recent exacerbation starting earlier today. She had one episode of vomiting after eating and has not tried to eat since. She's had multiple episodes of watery diarrhea. Denies any blood in the stool. She has diffuse abdominal cramping but worse in the lower abdomen. She states that she normally gets IV fluids and [place] and a lot of and she comes to the emergency department.  Patient also complains of arthralgias despite lateral hands. She believes is a flare of her rheumatoid arthritis. His inguinal on for multiple days. She's taking over-the-counter Aleve but she denies any increase in the frequency of its use. She has no new focal weakness or numbness. Past Medical History  Diagnosis Date  . Rheumatoid arthritis   . Gastroparesis   . Degenerative disc disease   . Fibromyalgia   . Diverticulitis   . Thyroid disease   . Lupus   . Porphyria    Past Surgical History  Procedure Laterality Date  . Cesarean section    . Replacement total knee Bilateral   . Abdominal hysterectomy    . Cholecystectomy     Family History  Problem Relation Age of Onset  . Leukemia Mother    History  Substance Use Topics  . Smoking status: Never Smoker   . Smokeless tobacco: Not on file  . Alcohol Use: Yes     Comment: social   OB History   Grav Para Term Preterm Abortions TAB SAB Ect Mult Living                 Review of Systems  Constitutional: Negative for fever and chills.  Respiratory: Negative for shortness of breath.   Cardiovascular: Negative for chest pain.  Gastrointestinal:  Positive for nausea, vomiting, abdominal pain and diarrhea.  Genitourinary: Negative for dysuria and flank pain.  Musculoskeletal: Positive for arthralgias. Negative for back pain, joint swelling, myalgias, neck pain and neck stiffness.  Skin: Positive for rash. Negative for pallor and wound.  Neurological: Negative for dizziness, weakness, light-headedness, numbness and headaches.  All other systems reviewed and are negative.     Allergies  Methotrexate derivatives; Morphine and related; and Relafen  Home Medications   Prior to Admission medications   Medication Sig Start Date End Date Taking? Authorizing Provider  gabapentin (NEURONTIN) 600 MG tablet Take 600 mg by mouth 3 (three) times daily.    Historical Provider, MD  levothyroxine (SYNTHROID, LEVOTHROID) 25 MCG tablet Take 25 mcg by mouth daily before breakfast. Take with 3100mcg to = 33mcg total    Historical Provider, MD  levothyroxine (SYNTHROID, LEVOTHROID) 300 MCG tablet Take 300 mcg by mouth daily before breakfast. Take with a 32mcg tablet to = 333mcg    Historical Provider, MD  methocarbamol (ROBAXIN) 500 MG tablet Take 2 tablets (1,000 mg total) by mouth 4 (four) times daily. 05/03/13   Carlisle Cater, PA-C  metoCLOPramide (REGLAN) 10 MG tablet Take 1 tablet (10 mg total) by mouth 4 (four) times daily. 05/03/13   Carlisle Cater, PA-C  naproxen sodium (ALEVE) 220 MG tablet Take 440 mg by mouth daily as needed (arthritis  pain).    Historical Provider, MD  omeprazole (PRILOSEC) 20 MG capsule Take 1 capsule (20 mg total) by mouth 2 (two) times daily before a meal. 03/19/13   Lorrine Kin, PA-C  ondansetron (ZOFRAN ODT) 4 MG disintegrating tablet Take 1 tablet (4 mg total) by mouth every 8 (eight) hours as needed for nausea or vomiting. 03/19/13   Lorrine Kin, PA-C  oxyCODONE (OXY IR/ROXICODONE) 5 MG immediate release tablet Take 5 mg by mouth every 4 (four) hours as needed for severe pain.    Historical Provider, MD  promethazine  (PHENERGAN) 25 MG suppository Place 25 mg rectally every 6 (six) hours as needed for nausea or vomiting.    Historical Provider, MD  promethazine (PHENERGAN) 25 MG tablet Take 25 mg by mouth 2 (two) times daily as needed for nausea.     Historical Provider, MD  tizanidine (ZANAFLEX) 2 MG capsule Take 4 mg by mouth 3 (three) times daily as needed for muscle spasms.  09/29/12   Historical Provider, MD   BP 147/112  Pulse 118  Temp(Src) 97.8 F (36.6 C)  Resp 20  Ht 5\' 1"  (1.549 m)  Wt 185 lb (83.915 kg)  BMI 34.97 kg/m2  SpO2 100% Physical Exam  Nursing note and vitals reviewed. Constitutional: She is oriented to person, place, and time. She appears well-developed and well-nourished. No distress.  HENT:  Head: Normocephalic and atraumatic.  Mouth/Throat: Oropharynx is clear and moist.  Eyes: EOM are normal. Pupils are equal, round, and reactive to light.  Neck: Normal range of motion. Neck supple.  Cardiovascular: Normal rate and regular rhythm.   Pulmonary/Chest: Effort normal and breath sounds normal. No respiratory distress. She has no wheezes. She has no rales.  Abdominal: Soft. Bowel sounds are normal. She exhibits no distension and no mass. There is tenderness (mild bilateral lower abdominal tenderness. The patient's abdomen is soft and there is no rebound or guarding.). There is no rebound and no guarding.  Musculoskeletal: Normal range of motion. She exhibits no edema and no tenderness.  Arthritic bilateral hands. Her stick of rheumatoid arthritis. There is mild facial swelling. She has good cap refill but decreased range of motion due to pain. No evidence of infection.  Neurological: She is alert and oriented to person, place, and time.  Moves all extremities without deficit. Sensation is grossly intact.  Skin: Skin is warm and dry. No rash noted. No erythema.  Psychiatric: She has a normal mood and affect. Her behavior is normal.    ED Course  Procedures (including critical  care time) Labs Review Labs Reviewed  CBC WITH DIFFERENTIAL  COMPREHENSIVE METABOLIC PANEL  LIPASE, BLOOD  URINALYSIS, ROUTINE W REFLEX MICROSCOPIC    Imaging Review No results found.   EKG Interpretation None      MDM   Final diagnoses:  None    Patient presents with a exacerbation of her chronic condition. Her abdomen is soft and benign. I do not believe that emergent imaging is necessary at this point. We'll treat symptomatically and check basic labs.  Patient's vital signs normalized in emergency department. Her abdominal pain is improved. She's had no vomiting in the department. There is no evidence dehydration. We'll attempt by mouth trial and likely discharge home.  Tolerating by mouth's. Patient does admit to taking extra Aleve due to arthritis. It is likely exacerbated her chronic abdominal issues. Advised her to followup with a rheumatologist. Return precautions given.  Julianne Rice, MD 06/01/13 [phone]

## 2013-06-01 NOTE — ED Notes (Signed)
MD at Bedside.

## 2013-06-01 NOTE — ED Notes (Signed)
ED Doctor at bedside.  

## 2013-06-01 NOTE — ED Notes (Signed)
Attempted IV times 2 and unsuccessful.  

## 2013-06-01 NOTE — ED Notes (Signed)
IV team paged and responded. They are not yet available to see the pt.

## 2013-06-01 NOTE — ED Notes (Signed)
IV team paged and responded. They will see the pt when they are available.

## 2013-06-01 NOTE — ED Notes (Signed)
Per Lita Mains, MD, pt does not need an IV for fluids.

## 2013-06-01 NOTE — ED Notes (Signed)
Abd pain below the umbilicus (feels like someone is stabbing her in the inside), diarrhea and vomiting.  Today also broke out in a rash.

## 2013-06-01 NOTE — ED Notes (Signed)
ED at bedside.

## 2013-06-01 NOTE — ED Notes (Signed)
Pt is reporting itching.

## 2013-06-02 ENCOUNTER — Emergency Department: Payer: Self-pay | Admitting: Emergency Medicine

## 2013-06-02 ENCOUNTER — Ambulatory Visit: Payer: Self-pay | Admitting: Pain Medicine

## 2013-06-02 LAB — COMPREHENSIVE METABOLIC PANEL
ANION GAP: 7 (ref 7–16)
AST: 102 U/L — AB (ref 15–37)
Albumin: 4.7 g/dL (ref 3.4–5.0)
Alkaline Phosphatase: 146 U/L — ABNORMAL HIGH
BILIRUBIN TOTAL: 0.7 mg/dL (ref 0.2–1.0)
BUN: 17 mg/dL (ref 7–18)
CHLORIDE: 105 mmol/L (ref 98–107)
CO2: 26 mmol/L (ref 21–32)
CREATININE: 1.42 mg/dL — AB (ref 0.60–1.30)
Calcium, Total: 9.2 mg/dL (ref 8.5–10.1)
EGFR (Non-African Amer.): 43 — ABNORMAL LOW
GFR CALC AF AMER: 50 — AB
GLUCOSE: 110 mg/dL — AB (ref 65–99)
Osmolality: 278 (ref 275–301)
POTASSIUM: 3.9 mmol/L (ref 3.5–5.1)
SGPT (ALT): 86 U/L — ABNORMAL HIGH (ref 12–78)
SODIUM: 138 mmol/L (ref 136–145)
TOTAL PROTEIN: 9 g/dL — AB (ref 6.4–8.2)

## 2013-06-02 LAB — CBC WITH DIFFERENTIAL/PLATELET
Basophil #: 0.1 10*3/uL
Basophil %: 1.1 %
Eosinophil #: 0 10*3/uL
Eosinophil %: 0.5 %
HCT: 37.3 %
HGB: 12.7 g/dL
Lymphocyte %: 12.6 %
Lymphs Abs: 0.7 10*3/uL — ABNORMAL LOW
MCH: 32 pg
MCHC: 34 g/dL
MCV: 94 fL
Monocyte #: 0.1 10*3/uL — ABNORMAL LOW
Monocyte %: 1.8 %
Neutrophil #: 4.7 10*3/uL
Neutrophil %: 84 %
Platelet: 193 10*3/uL
RBC: 3.97 X10 6/mm 3
RDW: 15.7 % — ABNORMAL HIGH
WBC: 5.6 10*3/uL

## 2013-06-03 LAB — URINALYSIS, COMPLETE
BILIRUBIN, UR: NEGATIVE
Blood: NEGATIVE
Glucose,UR: NEGATIVE mg/dL (ref 0–75)
Ketone: NEGATIVE
NITRITE: NEGATIVE
Ph: 6 (ref 4.5–8.0)
Protein: NEGATIVE
RBC,UR: 12 /HPF (ref 0–5)
SPECIFIC GRAVITY: 1.008 (ref 1.003–1.030)
Squamous Epithelial: 24

## 2013-06-15 ENCOUNTER — Ambulatory Visit: Payer: Self-pay | Admitting: Vascular Surgery

## 2013-06-17 ENCOUNTER — Emergency Department (HOSPITAL_COMMUNITY)
Admission: EM | Admit: 2013-06-17 | Discharge: 2013-06-17 | Disposition: A | Discharge status: Home / Self Care | Payer: Medicare Other | Attending: Emergency Medicine | Admitting: Emergency Medicine

## 2013-06-17 ENCOUNTER — Emergency Department (HOSPITAL_COMMUNITY): Payer: Medicare Other

## 2013-06-17 ENCOUNTER — Encounter (HOSPITAL_COMMUNITY): Payer: Self-pay | Admitting: Emergency Medicine

## 2013-06-17 DIAGNOSIS — Z90710 Acquired absence of both cervix and uterus: Secondary | ICD-10-CM | POA: Insufficient documentation

## 2013-06-17 DIAGNOSIS — G8929 Other chronic pain: Secondary | ICD-10-CM | POA: Insufficient documentation

## 2013-06-17 DIAGNOSIS — M069 Rheumatoid arthritis, unspecified: Secondary | ICD-10-CM | POA: Insufficient documentation

## 2013-06-17 DIAGNOSIS — Z8719 Personal history of other diseases of the digestive system: Secondary | ICD-10-CM | POA: Insufficient documentation

## 2013-06-17 DIAGNOSIS — Z9089 Acquired absence of other organs: Secondary | ICD-10-CM | POA: Insufficient documentation

## 2013-06-17 DIAGNOSIS — IMO0002 Reserved for concepts with insufficient information to code with codable children: Secondary | ICD-10-CM | POA: Insufficient documentation

## 2013-06-17 DIAGNOSIS — Z79899 Other long term (current) drug therapy: Secondary | ICD-10-CM | POA: Insufficient documentation

## 2013-06-17 DIAGNOSIS — R112 Nausea with vomiting, unspecified: Secondary | ICD-10-CM | POA: Insufficient documentation

## 2013-06-17 DIAGNOSIS — E079 Disorder of thyroid, unspecified: Secondary | ICD-10-CM | POA: Insufficient documentation

## 2013-06-17 DIAGNOSIS — R197 Diarrhea, unspecified: Secondary | ICD-10-CM | POA: Insufficient documentation

## 2013-06-17 DIAGNOSIS — R109 Unspecified abdominal pain: Secondary | ICD-10-CM | POA: Insufficient documentation

## 2013-06-17 DIAGNOSIS — IMO0001 Reserved for inherently not codable concepts without codable children: Secondary | ICD-10-CM | POA: Insufficient documentation

## 2013-06-17 LAB — COMPREHENSIVE METABOLIC PANEL
ALT: 42 U/L — AB (ref 0–35)
AST: 34 U/L (ref 0–37)
Albumin: 4.3 g/dL (ref 3.5–5.2)
Alkaline Phosphatase: 104 U/L (ref 39–117)
BUN: 22 mg/dL (ref 6–23)
CALCIUM: 9.8 mg/dL (ref 8.4–10.5)
CO2: 23 meq/L (ref 19–32)
Chloride: 99 mEq/L (ref 96–112)
Creatinine, Ser: 1.36 mg/dL — ABNORMAL HIGH (ref 0.50–1.10)
GFR calc Af Amer: 52 mL/min — ABNORMAL LOW (ref >90)
GFR, EST NON AFRICAN AMERICAN: 45 mL/min — AB (ref >90)
GLUCOSE: 99 mg/dL (ref 70–99)
POTASSIUM: 3.6 meq/L — AB (ref 3.7–5.3)
SODIUM: 141 meq/L (ref 137–147)
TOTAL PROTEIN: 8.1 g/dL (ref 6.0–8.3)
Total Bilirubin: 0.6 mg/dL (ref 0.3–1.2)

## 2013-06-17 LAB — CBC
HCT: 38 % (ref 36.0–46.0)
HEMOGLOBIN: 13 g/dL (ref 12.0–15.0)
MCH: 31.6 pg (ref 26.0–34.0)
MCHC: 34.2 g/dL (ref 30.0–36.0)
MCV: 92.5 fL (ref 78.0–100.0)
PLATELETS: 189 10*3/uL (ref 150–400)
RBC: 4.11 MIL/uL (ref 3.87–5.11)
RDW: 14.4 % (ref 11.5–15.5)
WBC: 9.3 10*3/uL (ref 4.0–10.5)

## 2013-06-17 LAB — I-STAT CG4 LACTIC ACID, ED: Lactic Acid, Venous: 1.38 mmol/L (ref 0.5–2.2)

## 2013-06-17 LAB — LIPASE, BLOOD: Lipase: 9 U/L — ABNORMAL LOW (ref 11–59)

## 2013-06-17 MED ORDER — HEPARIN SOD (PORK) LOCK FLUSH 100 UNIT/ML IV SOLN
500.0000 [IU] | Freq: Once | INTRAVENOUS | Status: AC
  Administered 2013-06-17: 500 [IU]
  Filled 2013-06-17 (×2): qty 5

## 2013-06-17 MED ORDER — DIPHENHYDRAMINE HCL 25 MG PO CAPS
50.0000 mg | ORAL_CAPSULE | Freq: Once | ORAL | Status: AC
  Administered 2013-06-17: 50 mg via ORAL
  Filled 2013-06-17: qty 2

## 2013-06-17 MED ORDER — HYDROMORPHONE HCL PF 1 MG/ML IJ SOLN
1.0000 mg | Freq: Once | INTRAMUSCULAR | Status: AC
  Administered 2013-06-17: 1 mg via INTRAVENOUS
  Filled 2013-06-17: qty 1

## 2013-06-17 MED ORDER — PROMETHAZINE HCL 25 MG PO TABS
25.0000 mg | ORAL_TABLET | Freq: Once | ORAL | Status: AC
  Administered 2013-06-17: 25 mg via ORAL
  Filled 2013-06-17: qty 1

## 2013-06-17 MED ORDER — SODIUM CHLORIDE 0.9 % IV BOLUS (SEPSIS)
1000.0000 mL | Freq: Once | INTRAVENOUS | Status: AC
  Administered 2013-06-17: 1000 mL via INTRAVENOUS

## 2013-06-17 MED ORDER — ONDANSETRON HCL 4 MG/2ML IJ SOLN
4.0000 mg | Freq: Once | INTRAMUSCULAR | Status: AC
  Administered 2013-06-17: 4 mg via INTRAVENOUS
  Filled 2013-06-17: qty 2

## 2013-06-17 NOTE — ED Notes (Signed)
Lactic acid results given to [employer], PA-C

## 2013-06-17 NOTE — Discharge Instructions (Signed)
Abdominal Pain, Women °Abdominal (stomach, pelvic, or belly) pain can be caused by many things. It is important to tell your doctor: °· The location of the pain. °· Does it come and go or is it present all the time? °· Are there things that start the pain (eating certain foods, exercise)? °· Are there other symptoms associated with the pain (fever, nausea, vomiting, diarrhea)? °All of this is helpful to know when trying to find the cause of the pain. °CAUSES  °· Stomach: virus or bacteria infection, or ulcer. °· Intestine: appendicitis (inflamed appendix), regional ileitis (Crohn's disease), ulcerative colitis (inflamed colon), irritable bowel syndrome, diverticulitis (inflamed diverticulum of the colon), or cancer of the stomach or intestine. °· Gallbladder disease or stones in the gallbladder. °· Kidney disease, kidney stones, or infection. °· Pancreas infection or cancer. °· Fibromyalgia (pain disorder). °· Diseases of the female organs: °· Uterus: fibroid (non-cancerous) tumors or infection. °· Fallopian tubes: infection or tubal pregnancy. °· Ovary: cysts or tumors. °· Pelvic adhesions (scar tissue). °· Endometriosis (uterus lining tissue growing in the pelvis and on the pelvic organs). °· Pelvic congestion syndrome (female organs filling up with blood just before the menstrual period). °· Pain with the menstrual period. °· Pain with ovulation (producing an egg). °· Pain with an IUD (intrauterine device, birth control) in the uterus. °· Cancer of the female organs. °· Functional pain (pain not caused by a disease, may improve without treatment). °· Psychological pain. °· Depression. °DIAGNOSIS  °Your doctor will decide the seriousness of your pain by doing an examination. °· Blood tests. °· X-rays. °· Ultrasound. °· CT scan (computed tomography, special type of X-ray). °· MRI (magnetic resonance imaging). °· Cultures, for infection. °· Barium enema (dye inserted in the large intestine, to better view it with  X-rays). °· Colonoscopy (looking in intestine with a lighted tube). °· Laparoscopy (minor surgery, looking in abdomen with a lighted tube). °· Major abdominal exploratory surgery (looking in abdomen with a large incision). °TREATMENT  °The treatment will depend on the cause of the pain.  °· Many cases can be observed and treated at home. °· Over-the-counter medicines recommended by your caregiver. °· Prescription medicine. °· Antibiotics, for infection. °· Birth control pills, for painful periods or for ovulation pain. °· Hormone treatment, for endometriosis. °· Nerve blocking injections. °· Physical therapy. °· Antidepressants. °· Counseling with a psychologist or psychiatrist. °· Minor or major surgery. °HOME CARE INSTRUCTIONS  °· Do not take laxatives, unless directed by your caregiver. °· Take over-the-counter pain medicine only if ordered by your caregiver. Do not take aspirin because it can cause an upset stomach or bleeding. °· Try a clear liquid diet (broth or water) as ordered by your caregiver. Slowly move to a bland diet, as tolerated, if the pain is related to the stomach or intestine. °· Have a thermometer and take your temperature several times a day, and record it. °· Bed rest and sleep, if it helps the pain. °· Avoid sexual intercourse, if it causes pain. °· Avoid stressful situations. °· Keep your follow-up appointments and tests, as your caregiver orders. °· If the pain does not go away with medicine or surgery, you may try: °· Acupuncture. °· Relaxation exercises (yoga, meditation). °· Group therapy. °· Counseling. °SEEK MEDICAL CARE IF:  °· You notice certain foods cause stomach pain. °· Your home care treatment is not helping your pain. °· You need stronger pain medicine. °· You want your IUD removed. °· You feel faint or   lightheaded.  You develop nausea and vomiting.  You develop a rash.  You are having side effects or an allergy to your medicine. SEEK IMMEDIATE MEDICAL CARE IF:   Your  pain does not go away or gets worse.  You have a fever.  Your pain is felt only in portions of the abdomen. The right side could possibly be appendicitis. The left lower portion of the abdomen could be colitis or diverticulitis.  You are passing blood in your stools (bright red or black tarry stools, with or without vomiting).  You have blood in your urine.  You develop chills, with or without a fever.  You pass out. MAKE SURE YOU:   Understand these instructions.  Will watch your condition.  Will get help right away if you are not doing well or get worse. Document Released: 11/26/2006 Document Revised: 04/23/2011 Document Reviewed: 12/16/2008 [facility] Patient Information 2014 [place], [place].  Chronic Pain Chronic pain can be defined as pain that is off and on and lasts for 3 6 months or longer. Many things cause chronic pain, which can make it difficult to make a diagnosis. There are many treatment options available for chronic pain. However, finding a treatment that works well for you may require trying various approaches until the right one is found. Many people benefit from a combination of two or more types of treatment to control their pain. SYMPTOMS  Chronic pain can occur anywhere in the body and can range from mild to very severe. Some types of chronic pain include:  Headache.  Low back pain.  Cancer pain.  Arthritis pain.  Neurogenic pain. This is pain resulting from damage to nerves. People with chronic pain may also have other symptoms such as:  Depression.  Anger.  Insomnia.  Anxiety. DIAGNOSIS  Your health care provider will help diagnose your condition over time. In many cases, the initial focus will be on excluding possible conditions that could be causing the pain. Depending on your symptoms, your health care provider may order tests to diagnose your condition. Some of these tests may include:   Blood tests.   CT scan.   MRI.   X-rays.    Ultrasounds.   Nerve conduction studies.  You may need to see a specialist.  TREATMENT  Finding treatment that works well may take time. You may be referred to a pain specialist. He or she may prescribe medicine or therapies, such as:   Mindful meditation or yoga.  Shots (injections) of numbing or pain-relieving medicines into the spine or area of pain.  Local electrical stimulation.  Acupuncture.   Massage therapy.   Aroma, color, light, or sound therapy.   Biofeedback.   Working with a physical therapist to keep from getting stiff.   Regular, gentle exercise.   Cognitive or behavioral therapy.   Group support.  Sometimes, surgery may be recommended.  HOME CARE INSTRUCTIONS   Take all medicines as directed by your health care provider.   Lessen stress in your life by relaxing and doing things such as listening to calming music.   Exercise or be active as directed by your health care provider.   Eat a healthy diet and include things such as vegetables, fruits, fish, and lean meats in your diet.   Keep all follow-up appointments with your health care provider.   Attend a support group with others suffering from chronic pain. SEEK MEDICAL CARE IF:   Your pain gets worse.   You develop a new pain  that was not there before.   You cannot tolerate medicines given to you by your health care provider.   You have new symptoms since your last visit with your health care provider.  SEEK IMMEDIATE MEDICAL CARE IF:   You feel weak.   You have decreased sensation or numbness.   You lose control of bowel or bladder function.   Your pain suddenly gets much worse.   You develop shaking.  You develop chills.  You develop confusion.  You develop chest pain.  You develop shortness of breath.  MAKE SURE YOU:  Understand these instructions.  Will watch your condition.  Will get help right away if you are not doing well or get  worse. Document Released: 10/21/2001 Document Revised: 10/01/2012 Document Reviewed: 07/25/2012 [facility] Patient Information 2014 [place].

## 2013-06-17 NOTE — ED Notes (Signed)
Crackers and water given to pt.  

## 2013-06-17 NOTE — ED Notes (Addendum)
Pt c/o lower abdominal pain that started last night. Stated that she has had this pain before. Pt had porta cath placed Monday because of the multiple times of having to be stuck with an IV for her degenerative disc disease and she goes every month for treatment. Stated that this pain is the worst it has every been. Vomited x 2 in the past 24 hours. Also has been having diarrhea as well.

## 2013-06-17 NOTE — ED Notes (Signed)
IV team notified of need for port access. 

## 2013-06-17 NOTE — ED Provider Notes (Signed)
CSN: [id number]     Arrival date & time 06/17/13  0751 History   First MD Initiated Contact with Patient 06/17/13 [phone]     Chief Complaint  Patient presents with  . Abdominal Pain  . Emesis     (Consider location/radiation/quality/duration/timing/severity/associated sxs/prior Treatment) Patient is a 48 y.o. female presenting with abdominal pain and vomiting. The history is provided by the patient.  Abdominal Pain Pain location:  Suprapubic Pain quality: aching and sharp   Pain radiates to:  Does not radiate Pain severity:  Moderate Onset quality:  Gradual Timing:  Constant Progression:  Worsening Chronicity:  Chronic Context: not diet changes, not eating, not recent illness, not sick contacts and not suspicious food intake   Relieved by:  Nothing Worsened by:  Nothing tried Associated symptoms: diarrhea, nausea and vomiting   Associated symptoms: no chest pain and no fever   Emesis Associated symptoms: abdominal pain and diarrhea     Past Medical History  Diagnosis Date  . Rheumatoid arthritis   . Gastroparesis   . Degenerative disc disease   . Fibromyalgia   . Diverticulitis   . Thyroid disease   . Lupus   . Porphyria    Past Surgical History  Procedure Laterality Date  . Cesarean section    . Replacement total knee Bilateral   . Abdominal hysterectomy    . Cholecystectomy     Family History  Problem Relation Age of Onset  . Leukemia Mother    History  Substance Use Topics  . Smoking status: Never Smoker   . Smokeless tobacco: Not on file  . Alcohol Use: Yes     Comment: social   OB History   Grav Para Term Preterm Abortions TAB SAB Ect Mult Living                 Review of Systems  Constitutional: Negative for fever.  Cardiovascular: Negative for chest pain.  Gastrointestinal: Positive for nausea, vomiting, abdominal pain and diarrhea.  All other systems reviewed and are negative.     Allergies  Methotrexate derivatives; Morphine and related;  and Relafen  Home Medications   Prior to Admission medications   Medication Sig Start Date End Date Taking? Authorizing Provider  gabapentin (NEURONTIN) 600 MG tablet Take 600 mg by mouth 3 (three) times daily.    Historical Provider, MD  levothyroxine (SYNTHROID, LEVOTHROID) 25 MCG tablet Take 25 mcg by mouth daily before breakfast. Take with 350mcg to = 358mcg total    Historical Provider, MD  levothyroxine (SYNTHROID, LEVOTHROID) 300 MCG tablet Take 300 mcg by mouth daily before breakfast. Take with a 23mcg tablet to = 367mcg    Historical Provider, MD  methocarbamol (ROBAXIN) 500 MG tablet Take 500 mg by mouth 4 (four) times daily.    Historical Provider, MD  metoCLOPramide (REGLAN) 10 MG tablet Take 1 tablet (10 mg total) by mouth 4 (four) times daily. 05/03/13   Carlisle Cater, PA-C  naproxen sodium (ALEVE) 220 MG tablet Take 440 mg by mouth daily as needed (arthritis pain).    Historical Provider, MD  omeprazole (PRILOSEC) 20 MG capsule Take 1 capsule (20 mg total) by mouth 2 (two) times daily before a meal. 03/19/13   Lorrine Kin, PA-C  ondansetron (ZOFRAN ODT) 4 MG disintegrating tablet Take 1 tablet (4 mg total) by mouth every 8 (eight) hours as needed for nausea or vomiting. 03/19/13   Lorrine Kin, PA-C  oxyCODONE (OXY IR/ROXICODONE) 5 MG immediate release tablet Take  5 mg by mouth every 4 (four) hours as needed for severe pain.    Historical Provider, MD  promethazine (PHENERGAN) 25 MG suppository Place 25 mg rectally every 6 (six) hours as needed for nausea or vomiting.    Historical Provider, MD  promethazine (PHENERGAN) 25 MG tablet Take 25 mg by mouth 2 (two) times daily as needed for nausea.     Historical Provider, MD  tizanidine (ZANAFLEX) 2 MG capsule Take 2 mg by mouth 3 (three) times daily as needed for muscle spasms.  09/29/12   Historical Provider, MD   BP 133/95  Pulse 90  Temp(Src) 98 F (36.7 C) (Oral)  Resp 18  SpO2 100% Physical Exam  Nursing note and vitals  reviewed. Constitutional: She is oriented to person, place, and time. She appears well-developed and well-nourished. No distress.  HENT:  Head: Normocephalic and atraumatic.  Eyes: EOM are normal. Pupils are equal, round, and reactive to light.  Neck: Normal range of motion. Neck supple.  Cardiovascular: Normal rate and regular rhythm.  Exam reveals no friction rub.   No murmur heard. Pulmonary/Chest: Effort normal and breath sounds normal. No respiratory distress. She has no wheezes. She has no rales.  Abdominal: Soft. She exhibits no distension. There is tenderness (lower abdominal pain). There is no rebound.  Musculoskeletal: Normal range of motion. She exhibits no edema.  Neurological: She is alert and oriented to person, place, and time.  Skin: She is not diaphoretic.    ED Course  Procedures (including critical care time) Labs Review Labs Reviewed  COMPREHENSIVE METABOLIC PANEL - Abnormal; Notable for the following:    Potassium 3.6 (*)    Creatinine, Ser 1.36 (*)    ALT 42 (*)    GFR calc non Af Amer 45 (*)    GFR calc Af Amer 52 (*)    All other components within normal limits  LIPASE, BLOOD - Abnormal; Notable for the following:    Lipase 9 (*)    All other components within normal limits  CBC  I-STAT CG4 LACTIC ACID, ED    Imaging Review Dg Abd Acute W/chest  06/17/2013   CLINICAL DATA:  Pain.  EXAM: ACUTE ABDOMEN SERIES (ABDOMEN 2 VIEW & CHEST 1 VIEW)  COMPARISON:  CT ABD-PELV W/ CM dated 05/04/2013  FINDINGS: Chest x-ray reveals no acute cardiopulmonary disease. Port-A-Cath noted with tip projected over superior vena cava.  Soft tissues abdomen are unremarkable. Surgical clips in right upper quadrant. Surgical clip left lower pelvis. The gas pattern is nonspecific. No free air. No acute bony abnormality.  IMPRESSION: Negative abdominal radiographs.  No acute cardiopulmonary disease.   Electronically Signed   By: Marcello Moores  Register   On: 06/17/2013 08:46     EKG  Interpretation None      MDM   Final diagnoses:  Abdominal pain    48 year old female with history of chronic abdominal pain, gastroparesis presents with abdominal pain. Has persistent chronic pain every day, worsened last night. States this is the worst it's been several months. Patient is tearful on exam. Has associated vomiting, diarrhea, nausea. Of note had of right chest port placed yesterday because she is a hard stick and has multiple procedures coming up with GI. Last CT done in February, showed possible duodenitis or peptic ulcer disease. Here vitals are stable. Patient has a benign abdomen on exam. She is talking to me on push on her belly without any focal guarding or rebound. Will try symptomatic treatment and will obtain  an abdominal series. We'll try to avoid CT if we can improve her symptomatically. Labs ok, feeling better after fluids, Dilaudid, stable for discharge. Repeat abdominal exam benign.  Osvaldo Shipper, MD 06/17/13 [phone]

## 2013-06-21 ENCOUNTER — Emergency Department (HOSPITAL_COMMUNITY)
Admission: EM | Admit: 2013-06-21 | Discharge: 2013-06-21 | Disposition: A | Discharge status: Home / Self Care | Payer: Medicare Other | Attending: Emergency Medicine | Admitting: Emergency Medicine

## 2013-06-21 ENCOUNTER — Encounter (HOSPITAL_COMMUNITY): Payer: Self-pay | Admitting: Emergency Medicine

## 2013-06-21 DIAGNOSIS — K3184 Gastroparesis: Secondary | ICD-10-CM | POA: Insufficient documentation

## 2013-06-21 DIAGNOSIS — R110 Nausea: Secondary | ICD-10-CM | POA: Insufficient documentation

## 2013-06-21 DIAGNOSIS — Z8719 Personal history of other diseases of the digestive system: Secondary | ICD-10-CM | POA: Insufficient documentation

## 2013-06-21 DIAGNOSIS — Z9089 Acquired absence of other organs: Secondary | ICD-10-CM | POA: Insufficient documentation

## 2013-06-21 DIAGNOSIS — Z90710 Acquired absence of both cervix and uterus: Secondary | ICD-10-CM | POA: Insufficient documentation

## 2013-06-21 DIAGNOSIS — Z8739 Personal history of other diseases of the musculoskeletal system and connective tissue: Secondary | ICD-10-CM | POA: Insufficient documentation

## 2013-06-21 DIAGNOSIS — E079 Disorder of thyroid, unspecified: Secondary | ICD-10-CM | POA: Insufficient documentation

## 2013-06-21 DIAGNOSIS — Z791 Long term (current) use of non-steroidal anti-inflammatories (NSAID): Secondary | ICD-10-CM | POA: Insufficient documentation

## 2013-06-21 DIAGNOSIS — M069 Rheumatoid arthritis, unspecified: Secondary | ICD-10-CM | POA: Insufficient documentation

## 2013-06-21 DIAGNOSIS — R109 Unspecified abdominal pain: Secondary | ICD-10-CM

## 2013-06-21 DIAGNOSIS — R1012 Left upper quadrant pain: Secondary | ICD-10-CM | POA: Insufficient documentation

## 2013-06-21 LAB — COMPREHENSIVE METABOLIC PANEL
ALT: 38 U/L — ABNORMAL HIGH (ref 0–35)
AST: 37 U/L (ref 0–37)
Albumin: 4.3 g/dL (ref 3.5–5.2)
Alkaline Phosphatase: 93 U/L (ref 39–117)
BILIRUBIN TOTAL: 0.6 mg/dL (ref 0.3–1.2)
BUN: 15 mg/dL (ref 6–23)
CALCIUM: 9.4 mg/dL (ref 8.4–10.5)
CHLORIDE: 100 meq/L (ref 96–112)
CO2: 21 mEq/L (ref 19–32)
CREATININE: 1.17 mg/dL — AB (ref 0.50–1.10)
GFR calc non Af Amer: 53 mL/min — ABNORMAL LOW (ref >90)
GFR, EST AFRICAN AMERICAN: 62 mL/min — AB (ref >90)
Glucose, Bld: 87 mg/dL (ref 70–99)
Potassium: 3.7 mEq/L (ref 3.7–5.3)
Sodium: 140 mEq/L (ref 137–147)
Total Protein: 8 g/dL (ref 6.0–8.3)

## 2013-06-21 LAB — CBC
HEMATOCRIT: 39.5 % (ref 36.0–46.0)
Hemoglobin: 13 g/dL (ref 12.0–15.0)
MCH: 31.1 pg (ref 26.0–34.0)
MCHC: 32.9 g/dL (ref 30.0–36.0)
MCV: 94.5 fL (ref 78.0–100.0)
Platelets: 193 10*3/uL (ref 150–400)
RBC: 4.18 MIL/uL (ref 3.87–5.11)
RDW: 14.6 % (ref 11.5–15.5)
WBC: 7.3 10*3/uL (ref 4.0–10.5)

## 2013-06-21 LAB — I-STAT TROPONIN, ED: Troponin i, poc: 0 ng/mL (ref 0.00–0.08)

## 2013-06-21 LAB — LIPASE, BLOOD: LIPASE: 10 U/L — AB (ref 11–59)

## 2013-06-21 MED ORDER — HYDROMORPHONE HCL PF 1 MG/ML IJ SOLN
1.0000 mg | Freq: Once | INTRAMUSCULAR | Status: AC
  Administered 2013-06-21: 1 mg via INTRAVENOUS
  Filled 2013-06-21: qty 1

## 2013-06-21 MED ORDER — SODIUM CHLORIDE 0.9 % IV BOLUS (SEPSIS)
1000.0000 mL | Freq: Once | INTRAVENOUS | Status: AC
  Administered 2013-06-21: 1000 mL via INTRAVENOUS

## 2013-06-21 MED ORDER — ONDANSETRON HCL 4 MG/2ML IJ SOLN
4.0000 mg | Freq: Once | INTRAMUSCULAR | Status: AC
  Administered 2013-06-21: 4 mg via INTRAVENOUS
  Filled 2013-06-21: qty 2

## 2013-06-21 MED ORDER — DIPHENHYDRAMINE HCL 25 MG PO CAPS
25.0000 mg | ORAL_CAPSULE | Freq: Once | ORAL | Status: AC
  Administered 2013-06-21: 25 mg via ORAL
  Filled 2013-06-21: qty 1

## 2013-06-21 NOTE — ED Notes (Signed)
Pt st's pain is returning rates pain # 7 on pain scale 0/10.  Pain meds given.

## 2013-06-21 NOTE — ED Provider Notes (Signed)
CSN: [id number]     Arrival date & time 06/21/13  1633 History   First MD Initiated Contact with Patient 06/21/13 1645     Chief Complaint  Patient presents with  . Abdominal Pain     (Consider location/radiation/quality/duration/timing/severity/associated sxs/prior Treatment) Patient is a 48 y.o. female presenting with abdominal pain. The history is provided by the patient.  Abdominal Pain Pain location:  Epigastric and LUQ Pain quality: aching and sharp   Pain radiates to:  Does not radiate Pain severity:  Moderate Onset quality:  Gradual Duration:  2 days Timing:  Constant Progression:  Worsening Chronicity:  Chronic Context: not eating and not trauma   Relieved by:  Nothing Worsened by:  Nothing tried Associated symptoms: nausea   Associated symptoms: no chest pain, no chills, no cough, no diarrhea, no fever, no shortness of breath and no vomiting     Past Medical History  Diagnosis Date  . Rheumatoid arthritis   . Gastroparesis   . Degenerative disc disease   . Fibromyalgia   . Diverticulitis   . Thyroid disease   . Lupus   . Porphyria    Past Surgical History  Procedure Laterality Date  . Cesarean section    . Replacement total knee Bilateral   . Abdominal hysterectomy    . Cholecystectomy     Family History  Problem Relation Age of Onset  . Leukemia Mother    History  Substance Use Topics  . Smoking status: Never Smoker   . Smokeless tobacco: Not on file  . Alcohol Use: Yes     Comment: social   OB History   Grav Para Term Preterm Abortions TAB SAB Ect Mult Living                 Review of Systems  Constitutional: Negative for fever and chills.  Respiratory: Negative for cough and shortness of breath.   Cardiovascular: Negative for chest pain and leg swelling.  Gastrointestinal: Positive for nausea and abdominal pain. Negative for vomiting and diarrhea.  All other systems reviewed and are negative.     Allergies  Methotrexate  derivatives; Morphine and related; and Relafen  Home Medications   Prior to Admission medications   Medication Sig Start Date End Date Taking? Authorizing Provider  gabapentin (NEURONTIN) 600 MG tablet Take 600 mg by mouth 3 (three) times daily.   Yes Historical Provider, MD  levothyroxine (SYNTHROID, LEVOTHROID) 25 MCG tablet Take 25 mcg by mouth daily before breakfast. Take with 342mcg to = 38mcg total   Yes Historical Provider, MD  levothyroxine (SYNTHROID, LEVOTHROID) 300 MCG tablet Take 300 mcg by mouth daily before breakfast. Take with a 10mcg tablet to = 349mcg   Yes Historical Provider, MD  methocarbamol (ROBAXIN) 500 MG tablet Take 500 mg by mouth 4 (four) times daily.   Yes Historical Provider, MD  metoCLOPramide (REGLAN) 10 MG tablet Take 1 tablet (10 mg total) by mouth 4 (four) times daily. 05/03/13  Yes Carlisle Cater, PA-C  naproxen sodium (ALEVE) 220 MG tablet Take 440 mg by mouth daily as needed (arthritis pain).   Yes Historical Provider, MD  omeprazole (PRILOSEC) 20 MG capsule Take 1 capsule (20 mg total) by mouth 2 (two) times daily before a meal. 03/19/13  Yes Lauren Burnetta Sabin, PA-C  ondansetron (ZOFRAN ODT) 4 MG disintegrating tablet Take 1 tablet (4 mg total) by mouth every 8 (eight) hours as needed for nausea or vomiting. 03/19/13  Yes Lorrine Kin, PA-C  oxyCODONE (  OXY IR/ROXICODONE) 5 MG immediate release tablet Take 5 mg by mouth every 4 (four) hours as needed for severe pain.   Yes Historical Provider, MD  promethazine (PHENERGAN) 25 MG suppository Place 25 mg rectally every 6 (six) hours as needed for nausea or vomiting.   Yes Historical Provider, MD  promethazine (PHENERGAN) 25 MG tablet Take 25 mg by mouth 2 (two) times daily as needed for nausea.    Yes Historical Provider, MD  tizanidine (ZANAFLEX) 2 MG capsule Take 2-4 mg by mouth 3 (three) times daily as needed for muscle spasms.  09/29/12  Yes Historical Provider, MD   BP 124/85  Pulse 102  Temp(Src) 97.7 F (36.5  C) (Oral)  Resp 18  SpO2 100% Physical Exam  Nursing note and vitals reviewed. Constitutional: She is oriented to person, place, and time. She appears well-developed and well-nourished. No distress.  HENT:  Head: Normocephalic and atraumatic.  Eyes: EOM are normal. Pupils are equal, round, and reactive to light.  Neck: Normal range of motion. Neck supple.  Cardiovascular: Normal rate and regular rhythm.  Exam reveals no friction rub.   No murmur heard. Pulmonary/Chest: Effort normal and breath sounds normal. No respiratory distress. She has no wheezes. She has no rales.  Abdominal: Soft. She exhibits no distension. There is tenderness (epigastric, LUQ). There is no rebound and no guarding.  Musculoskeletal: Normal range of motion. She exhibits no edema.  Neurological: She is alert and oriented to person, place, and time.  Skin: No rash noted. She is not diaphoretic.    ED Course  Procedures (including critical care time) Labs Review Labs Reviewed  COMPREHENSIVE METABOLIC PANEL - Abnormal; Notable for the following:    Creatinine, Ser 1.17 (*)    ALT 38 (*)    GFR calc non Af Amer 53 (*)    GFR calc Af Amer 62 (*)    All other components within normal limits  LIPASE, BLOOD - Abnormal; Notable for the following:    Lipase 10 (*)    All other components within normal limits  CBC  I-STAT TROPOININ, ED    Imaging Review No results found.   EKG Interpretation None      MDM   Final diagnoses:  Abdominal pain    83F w/ hx gastroparesis, fibromyalgia presents with abdominal pain. I saw her personally 4 days ago for same. Unable to tolerate PO for past 2 days, associated LE tingling. Patient had port placed due to multiple visits for same. Associated nausea, no vomiting, no diarrhea. No fevers.  Patient here with stable vitals. Belly benign without any peritoneal signs, talking to me during exam. Will treat symptomatically and attempt PO challenge. No need for imaging  today. After pain meds doing better, eating. Stable for discharge.     Osvaldo Shipper, MD 06/21/13 [phone]

## 2013-06-21 NOTE — ED Notes (Signed)
Pt requesting more pain meds.  Meds given

## 2013-06-21 NOTE — ED Notes (Signed)
Pt ambulatory to bathroom without any problems 

## 2013-06-21 NOTE — ED Notes (Signed)
The p[t has had abd  Pain with n v for several  Months.  She has had a portacath placed this past Monday in preparation for  Many tests.lmp none

## 2013-06-21 NOTE — Discharge Instructions (Signed)

## 2013-06-21 NOTE — ED Notes (Signed)
Pt eating turkey sandwich at this time.  

## 2013-06-24 ENCOUNTER — Encounter (HOSPITAL_COMMUNITY): Payer: Self-pay | Admitting: Emergency Medicine

## 2013-06-24 ENCOUNTER — Emergency Department (HOSPITAL_COMMUNITY)
Admission: EM | Admit: 2013-06-24 | Discharge: 2013-06-24 | Disposition: A | Discharge status: Home / Self Care | Payer: Medicare Other | Source: Home / Self Care | Attending: Emergency Medicine | Admitting: Emergency Medicine

## 2013-06-24 DIAGNOSIS — R000 Tachycardia, unspecified: Secondary | ICD-10-CM

## 2013-06-24 DIAGNOSIS — R197 Diarrhea, unspecified: Secondary | ICD-10-CM | POA: Insufficient documentation

## 2013-06-24 DIAGNOSIS — Z791 Long term (current) use of non-steroidal anti-inflammatories (NSAID): Secondary | ICD-10-CM | POA: Insufficient documentation

## 2013-06-24 DIAGNOSIS — E079 Disorder of thyroid, unspecified: Secondary | ICD-10-CM

## 2013-06-24 DIAGNOSIS — M329 Systemic lupus erythematosus, unspecified: Secondary | ICD-10-CM | POA: Insufficient documentation

## 2013-06-24 DIAGNOSIS — G8929 Other chronic pain: Secondary | ICD-10-CM

## 2013-06-24 DIAGNOSIS — K3184 Gastroparesis: Secondary | ICD-10-CM

## 2013-06-24 DIAGNOSIS — R1084 Generalized abdominal pain: Secondary | ICD-10-CM

## 2013-06-24 DIAGNOSIS — Z9089 Acquired absence of other organs: Secondary | ICD-10-CM

## 2013-06-24 DIAGNOSIS — R630 Anorexia: Secondary | ICD-10-CM | POA: Insufficient documentation

## 2013-06-24 DIAGNOSIS — IMO0001 Reserved for inherently not codable concepts without codable children: Secondary | ICD-10-CM | POA: Insufficient documentation

## 2013-06-24 DIAGNOSIS — M069 Rheumatoid arthritis, unspecified: Secondary | ICD-10-CM

## 2013-06-24 DIAGNOSIS — Z79899 Other long term (current) drug therapy: Secondary | ICD-10-CM

## 2013-06-24 DIAGNOSIS — IMO0002 Reserved for concepts with insufficient information to code with codable children: Secondary | ICD-10-CM | POA: Insufficient documentation

## 2013-06-24 DIAGNOSIS — R112 Nausea with vomiting, unspecified: Secondary | ICD-10-CM

## 2013-06-24 DIAGNOSIS — Z90710 Acquired absence of both cervix and uterus: Secondary | ICD-10-CM | POA: Insufficient documentation

## 2013-06-24 DIAGNOSIS — R109 Unspecified abdominal pain: Principal | ICD-10-CM

## 2013-06-24 LAB — CBC WITH DIFFERENTIAL/PLATELET
Basophils Absolute: 0 10*3/uL (ref 0.0–0.1)
Basophils Relative: 0 % (ref 0–1)
Eosinophils Absolute: 0.3 10*3/uL (ref 0.0–0.7)
Eosinophils Relative: 4 % (ref 0–5)
HEMATOCRIT: 37 % (ref 36.0–46.0)
Hemoglobin: 12.6 g/dL (ref 12.0–15.0)
LYMPHS PCT: 20 % (ref 12–46)
Lymphs Abs: 1.6 10*3/uL (ref 0.7–4.0)
MCH: 31.7 pg (ref 26.0–34.0)
MCHC: 34.1 g/dL (ref 30.0–36.0)
MCV: 93 fL (ref 78.0–100.0)
Monocytes Absolute: 0.3 10*3/uL (ref 0.1–1.0)
Monocytes Relative: 4 % (ref 3–12)
Neutro Abs: 5.9 10*3/uL (ref 1.7–7.7)
Neutrophils Relative %: 72 % (ref 43–77)
PLATELETS: 226 10*3/uL (ref 150–400)
RBC: 3.98 MIL/uL (ref 3.87–5.11)
RDW: 14.3 % (ref 11.5–15.5)
WBC: 8.2 10*3/uL (ref 4.0–10.5)

## 2013-06-24 LAB — COMPREHENSIVE METABOLIC PANEL
ALT: 45 U/L — AB (ref 0–35)
AST: 40 U/L — ABNORMAL HIGH (ref 0–37)
Albumin: 4.5 g/dL (ref 3.5–5.2)
Alkaline Phosphatase: 103 U/L (ref 39–117)
BILIRUBIN TOTAL: 0.8 mg/dL (ref 0.3–1.2)
BUN: 17 mg/dL (ref 6–23)
CHLORIDE: 102 meq/L (ref 96–112)
CO2: 20 meq/L (ref 19–32)
Calcium: 9.6 mg/dL (ref 8.4–10.5)
Creatinine, Ser: 1.4 mg/dL — ABNORMAL HIGH (ref 0.50–1.10)
GFR, EST AFRICAN AMERICAN: 50 mL/min — AB (ref >90)
GFR, EST NON AFRICAN AMERICAN: 43 mL/min — AB (ref >90)
Glucose, Bld: 87 mg/dL (ref 70–99)
Potassium: 3.3 mEq/L — ABNORMAL LOW (ref 3.7–5.3)
SODIUM: 141 meq/L (ref 137–147)
Total Protein: 8.1 g/dL (ref 6.0–8.3)

## 2013-06-24 LAB — URINALYSIS, ROUTINE W REFLEX MICROSCOPIC
BILIRUBIN URINE: NEGATIVE
GLUCOSE, UA: NEGATIVE mg/dL
HGB URINE DIPSTICK: NEGATIVE
KETONES UR: NEGATIVE mg/dL
Nitrite: NEGATIVE
Protein, ur: NEGATIVE mg/dL
Specific Gravity, Urine: 1.013 (ref 1.005–1.030)
UROBILINOGEN UA: 1 mg/dL (ref 0.0–1.0)
pH: 7 (ref 5.0–8.0)

## 2013-06-24 LAB — URINE MICROSCOPIC-ADD ON

## 2013-06-24 LAB — LIPASE, BLOOD: Lipase: 15 U/L (ref 11–59)

## 2013-06-24 MED ORDER — SODIUM CHLORIDE 0.9 % IV BOLUS (SEPSIS)
2000.0000 mL | Freq: Once | INTRAVENOUS | Status: AC
  Administered 2013-06-24: 2000 mL via INTRAVENOUS

## 2013-06-24 MED ORDER — SODIUM CHLORIDE 0.9 % IJ SOLN
10.0000 mL | INTRAMUSCULAR | Status: DC | PRN
  Administered 2013-06-24: 10 mL

## 2013-06-24 MED ORDER — HYDROMORPHONE HCL PF 1 MG/ML IJ SOLN
1.0000 mg | Freq: Once | INTRAMUSCULAR | Status: AC
  Administered 2013-06-24: 1 mg via INTRAVENOUS
  Filled 2013-06-24: qty 1

## 2013-06-24 MED ORDER — POTASSIUM CHLORIDE CRYS ER 20 MEQ PO TBCR
40.0000 meq | EXTENDED_RELEASE_TABLET | Freq: Once | ORAL | Status: AC
  Administered 2013-06-24: 40 meq via ORAL
  Filled 2013-06-24: qty 2

## 2013-06-24 MED ORDER — SODIUM CHLORIDE 0.9 % IJ SOLN: 10.0000 mL | Freq: Two times a day (BID) | INTRAMUSCULAR | Status: DC

## 2013-06-24 MED ORDER — HEPARIN SOD (PORK) LOCK FLUSH 100 UNIT/ML IV SOLN
500.0000 [IU] | INTRAVENOUS | Status: AC | PRN
  Administered 2013-06-24: 500 [IU]

## 2013-06-24 MED ORDER — ONDANSETRON HCL 4 MG/2ML IJ SOLN
4.0000 mg | Freq: Once | INTRAMUSCULAR | Status: AC
  Administered 2013-06-24: 4 mg via INTRAVENOUS
  Filled 2013-06-24: qty 2

## 2013-06-24 NOTE — Discharge Instructions (Signed)
1. Medications: usual home medications 2. Treatment: rest, drink plenty of fluids,  3. Follow Up: Please followup with your primary doctor within 2 days for discussion of your diagnoses and further evaluation after today's visit; return to emergency Department if her symptoms become unbearable, you develop high fevers or other concerning symptoms.    Nausea and Vomiting Nausea is a sick feeling that often comes before throwing up (vomiting). Vomiting is a reflex where stomach contents come out of your mouth. Vomiting can cause severe loss of body fluids (dehydration). Children and elderly adults can become dehydrated quickly, especially if they also have diarrhea. Nausea and vomiting are symptoms of a condition or disease. It is important to find the cause of your symptoms. CAUSES   Direct irritation of the stomach lining. This irritation can result from increased acid production (gastroesophageal reflux disease), infection, food poisoning, taking certain medicines (such as nonsteroidal anti-inflammatory drugs), alcohol use, or tobacco use.  Signals from the brain.These signals could be caused by a headache, heat exposure, an inner ear disturbance, increased pressure in the brain from injury, infection, a tumor, or a concussion, pain, emotional stimulus, or metabolic problems.  An obstruction in the gastrointestinal tract (bowel obstruction).  Illnesses such as diabetes, hepatitis, gallbladder problems, appendicitis, kidney problems, cancer, sepsis, atypical symptoms of a heart attack, or eating disorders.  Medical treatments such as chemotherapy and radiation.  Receiving medicine that makes you sleep (general anesthetic) during surgery. DIAGNOSIS Your caregiver may ask for tests to be done if the problems do not improve after a few days. Tests may also be done if symptoms are severe or if the reason for the nausea and vomiting is not clear. Tests may include:  Urine tests.  Blood  tests.  Stool tests.  Cultures (to look for evidence of infection).  X-rays or other imaging studies. Test results can help your caregiver make decisions about treatment or the need for additional tests. TREATMENT You need to stay well hydrated. Drink frequently but in small amounts.You may wish to drink water, sports drinks, clear broth, or eat frozen ice pops or gelatin dessert to help stay hydrated.When you eat, eating slowly may help prevent nausea.There are also some antinausea medicines that may help prevent nausea. HOME CARE INSTRUCTIONS   Take all medicine as directed by your caregiver.  If you do not have an appetite, do not force yourself to eat. However, you must continue to drink fluids.  If you have an appetite, eat a normal diet unless your caregiver tells you differently.  Eat a variety of complex carbohydrates (rice, wheat, potatoes, bread), lean meats, yogurt, fruits, and vegetables.  Avoid high-fat foods because they are more difficult to digest.  Drink enough water and fluids to keep your urine clear or pale yellow.  If you are dehydrated, ask your caregiver for specific rehydration instructions. Signs of dehydration may include:  Severe thirst.  Dry lips and mouth.  Dizziness.  Dark urine.  Decreasing urine frequency and amount.  Confusion.  Rapid breathing or pulse. SEEK IMMEDIATE MEDICAL CARE IF:   You have blood or brown flecks (like coffee grounds) in your vomit.  You have black or bloody stools.  You have a severe headache or stiff neck.  You are confused.  You have severe abdominal pain.  You have chest pain or trouble breathing.  You do not urinate at least once every 8 hours.  You develop cold or clammy skin.  You continue to vomit for longer than 24  to 48 hours.  You have a fever. MAKE SURE YOU:   Understand these instructions.  Will watch your condition.  Will get help right away if you are not doing well or get  worse. Document Released: 01/29/2005 Document Revised: 04/23/2011 Document Reviewed: 06/28/2010 [facility] Patient Information 2014 [place], [place].

## 2013-06-24 NOTE — ED Provider Notes (Signed)
CSN: [id number]     Arrival date & time 06/24/13  1240 History   First MD Initiated Contact with Patient 06/24/13 1608     Chief Complaint  Patient presents with  . Abdominal Pain     (Consider location/radiation/quality/duration/timing/severity/associated sxs/prior Treatment) The history is provided by the patient and medical records. No language interpreter was used.    Adrienne Flores is a 48 y.o. female  with a hx of RA, gastroparesis, DDD, fibromyalgia, lupus, porphyria presents to the Emergency Department complaining of gradual, persistent, progressively worsening diffuse lower abd pain onset 5 year ago.  Has persistent chronic pain every day, but worsened again today.  Associated symptoms include N/V/D, decreased appetite.  Pain is constant and stabbing. She is taking oxycodone without relief and pain is rated at a 10/10 without radiations. She also reports using phenergan suppositories.  Nothing makes it better and attempting to eat makes it worse.   Pt denies fever, chills, headache, neck pain, chest pain, SOB, weakness, dizziness, sycnope, dysuria, hematuria.  Pt reports she is supposed to see GI, but they refused until she had a port placed.  Review shows that GI is requesting multiple procedures and because she is a difficult cannulation they requested port placement. Patient reports this makes her feel much better. Pt reports that these symptoms are the same as previous episodes, but she feels as if they are more intense.      Past Medical History  Diagnosis Date  . Rheumatoid arthritis   . Gastroparesis   . Degenerative disc disease   . Fibromyalgia   . Diverticulitis   . Thyroid disease   . Lupus   . Porphyria    Past Surgical History  Procedure Laterality Date  . Cesarean section    . Replacement total knee Bilateral   . Abdominal hysterectomy    . Cholecystectomy     Family History  Problem Relation Age of Onset  . Leukemia Mother    History  Substance Use  Topics  . Smoking status: Never Smoker   . Smokeless tobacco: Not on file  . Alcohol Use: Yes     Comment: social   OB History   Grav Para Term Preterm Abortions TAB SAB Ect Mult Living                 Review of Systems  Constitutional: Negative for fever, diaphoresis, appetite change, fatigue and unexpected weight change.  HENT: Negative for mouth sores and trouble swallowing.   Respiratory: Negative for cough, chest tightness, shortness of breath, wheezing and stridor.   Cardiovascular: Negative for chest pain and palpitations.  Gastrointestinal: Positive for nausea, vomiting, abdominal pain and diarrhea. Negative for constipation, blood in stool, abdominal distention and rectal pain.  Genitourinary: Negative for dysuria, urgency, frequency, hematuria, flank pain and difficulty urinating.  Musculoskeletal: Negative for back pain, neck pain and neck stiffness.  Skin: Negative for rash.  Neurological: Negative for weakness.  Hematological: Negative for adenopathy.  Psychiatric/Behavioral: Negative for confusion.  All other systems reviewed and are negative.     Allergies  Methotrexate derivatives; Morphine and related; and Relafen  Home Medications   Prior to Admission medications   Medication Sig Start Date End Date Taking? Authorizing Provider  gabapentin (NEURONTIN) 600 MG tablet Take 600 mg by mouth 3 (three) times daily.   Yes Historical Provider, MD  levothyroxine (SYNTHROID, LEVOTHROID) 25 MCG tablet Take 25 mcg by mouth daily before breakfast. Take with 374mcg to = 364mcg total  Yes Historical Provider, MD  levothyroxine (SYNTHROID, LEVOTHROID) 300 MCG tablet Take 300 mcg by mouth daily before breakfast. Take with a 67mcg tablet to = 376mcg   Yes Historical Provider, MD  methocarbamol (ROBAXIN) 500 MG tablet Take 500 mg by mouth 4 (four) times daily.   Yes Historical Provider, MD  metoCLOPramide (REGLAN) 10 MG tablet Take 1 tablet (10 mg total) by mouth 4 (four) times  daily. 05/03/13  Yes Carlisle Cater, PA-C  naproxen sodium (ALEVE) 220 MG tablet Take 440 mg by mouth daily as needed (arthritis pain).   Yes Historical Provider, MD  omeprazole (PRILOSEC) 20 MG capsule Take 1 capsule (20 mg total) by mouth 2 (two) times daily before a meal. 03/19/13  Yes Lauren Burnetta Sabin, PA-C  ondansetron (ZOFRAN ODT) 4 MG disintegrating tablet Take 1 tablet (4 mg total) by mouth every 8 (eight) hours as needed for nausea or vomiting. 03/19/13  Yes Lauren Burnetta Sabin, PA-C  oxyCODONE (OXY IR/ROXICODONE) 5 MG immediate release tablet Take 5 mg by mouth every 4 (four) hours as needed for severe pain.   Yes Historical Provider, MD  promethazine (PHENERGAN) 25 MG suppository Place 25 mg rectally every 6 (six) hours as needed for nausea or vomiting.   Yes Historical Provider, MD  promethazine (PHENERGAN) 25 MG tablet Take 25 mg by mouth 2 (two) times daily as needed for nausea.    Yes Historical Provider, MD  tizanidine (ZANAFLEX) 2 MG capsule Take 2-4 mg by mouth 3 (three) times daily as needed for muscle spasms.  09/29/12  Yes Historical Provider, MD   BP 127/83  Pulse 89  Temp(Src) 98.4 F (36.9 C) (Oral)  Resp 18  SpO2 100% Physical Exam  Nursing note and vitals reviewed. Constitutional: She is oriented to person, place, and time. She appears well-developed and well-nourished. No distress.  Awake, alert, nontoxic appearance  HENT:  Head: Normocephalic and atraumatic.  Mouth/Throat: Oropharynx is clear and moist. No oropharyngeal exudate.  Eyes: Conjunctivae are normal. No scleral icterus.  Neck: Normal range of motion. Neck supple.  Cardiovascular: Regular rhythm, normal heart sounds and intact distal pulses.  Tachycardia present.   Pulses:      Radial pulses are 2+ on the right side, and 2+ on the left side.       Dorsalis pedis pulses are 2+ on the right side, and 2+ on the left side.       Posterior tibial pulses are 2+ on the right side, and 2+ on the left side.  Mild  tachycardia  Pulmonary/Chest: Effort normal and breath sounds normal. No respiratory distress. She has no wheezes.  Port in place in the right chest  Abdominal: Soft. Bowel sounds are normal. She exhibits no distension and no mass. There is no tenderness. There is no rebound and no guarding.  Abdomen is soft and nontender No rebound, peritoneal signs  Musculoskeletal: Normal range of motion. She exhibits no edema.  Lymphadenopathy:    She has no cervical adenopathy.  Neurological: She is alert and oriented to person, place, and time. She exhibits normal muscle tone. Coordination normal.  Speech is clear and goal oriented Moves extremities without ataxia  Skin: Skin is warm and dry. No rash noted. She is not diaphoretic. No erythema.  Psychiatric: She has a normal mood and affect.    ED Course  Procedures (including critical care time) Labs Review Labs Reviewed  COMPREHENSIVE METABOLIC PANEL - Abnormal; Notable for the following:    Potassium 3.3 (*)  Creatinine, Ser 1.40 (*)    AST 40 (*)    ALT 45 (*)    GFR calc non Af Amer 43 (*)    GFR calc Af Amer 50 (*)    All other components within normal limits  URINALYSIS, ROUTINE W REFLEX MICROSCOPIC - Abnormal; Notable for the following:    Leukocytes, UA TRACE (*)    All other components within normal limits  URINE MICROSCOPIC-ADD ON - Abnormal; Notable for the following:    Squamous Epithelial / LPF MANY (*)    Bacteria, UA FEW (*)    All other components within normal limits  CBC WITH DIFFERENTIAL  LIPASE, BLOOD    Imaging Review No results found.   EKG Interpretation None      MDM   Final diagnoses:  Chronic abdominal pain  Nausea and vomiting   Adrienne Flores with history of chronic abdominal pain, gastroparesis presents with acute on chronic abdominal pain with associated vomiting, diarrhea, nausea. Right chest port placed 06/20/13 because she is a hard stick and has multiple procedures coming up with GI.  Last CT done in February, showed possible duodenitis or peptic ulcer disease.    Pt with benign abd.  Will check labs, give fluid, pain control and PO trial.  Will attempt to avoid CT today if possible.  Pt is tachycardic, but afebrile and there is no evidence of infection at the site of the port.    8:03 PM Patient remains a soft nontender abdomen.  It is improving and patient is no longer tachycardic. She reports resolution of abdominal pain and is tolerating by mouth fluids. We'll attempt to by mouth solids.  Urinalysis pending.  Patient with mild elevation in creatinine but no elevation in her BUN. A mild hypokalemia repleted here in the department. I believe that patient's creatinine bumped is likely secondary to dehydration from her persistent vomiting. Patient is being given 2 L of fluid to correct this.  Mild elevation in AST and ALT to baseline.  9:09 PM Pt reports she is feeling much better and she is currently tolerating crackers and water without difficulty. Patient reports she has plenty of pain and nausea medicine at home and does not need a prescription for either. On repeat exams remained soft and nontender. She is no longer tachycardic.  Analysis evidence of urinary tract infection.  Patient is nontoxic, nonseptic appearing, in no apparent distress.  Patient's pain and other symptoms adequately managed in emergency department.  Fluid bolus given.  Labs and vitals reviewed; I do not believe the patient is a CT scan of her abdomen at this time.  Patient does not meet the SIRS or Sepsis criteria.  On repeat exam patient does not have a surgical abdomin and there are no peritoneal signs.  No indication of appendicitis, bowel obstruction, bowel perforation, cholecystitis, diverticulitis.  Patient discharged home and given strict instructions for follow-up with their primary care physician.  I have also discussed reasons to return immediately to the ER including high fevers, intractable vomiting  or other concerning symptoms.  Patient expresses understanding and agrees with plan.  It has been determined that no acute conditions requiring further emergency intervention are present at this time. The patient/guardian have been advised of the diagnosis and plan. We have discussed signs and symptoms that warrant return to the ED, such as changes or worsening in symptoms.   Vital signs are stable at discharge.   BP 127/83  Pulse 89  Temp(Src) 98.4 F (36.9  C) (Oral)  Resp 18  SpO2 100%  Patient/guardian has voiced understanding and agreed to follow-up with the PCP or specialist.   The patient was discussed with Dr. Vivi Martens who agrees with the treatment plan.     Jarrett Soho Minerva Bluett, PA-C 06/24/13 2115

## 2013-06-24 NOTE — ED Notes (Signed)
IV team notified of port needing to be accessed.

## 2013-06-24 NOTE — ED Notes (Signed)
IV team paged to deaccess port.  

## 2013-06-24 NOTE — ED Notes (Signed)
Pt presents to department for evaluation of diffuse abdominal pain. States chronic issues with same. 9/10 pain at the time. Pt also states nausea. Pt is alert and oriented x4.

## 2013-06-25 ENCOUNTER — Emergency Department (HOSPITAL_COMMUNITY): Payer: Medicare Other

## 2013-06-25 ENCOUNTER — Inpatient Hospital Stay (HOSPITAL_COMMUNITY)
Admission: EM | Admit: 2013-06-25 | Discharge: 2013-06-28 | DRG: 684 | Disposition: A | Discharge status: Home / Self Care | Payer: Medicare Other | Attending: Internal Medicine | Admitting: Internal Medicine

## 2013-06-25 ENCOUNTER — Encounter (HOSPITAL_COMMUNITY): Payer: Self-pay | Admitting: Emergency Medicine

## 2013-06-25 ENCOUNTER — Inpatient Hospital Stay (HOSPITAL_COMMUNITY): Payer: Medicare Other

## 2013-06-25 DIAGNOSIS — Z79899 Other long term (current) drug therapy: Secondary | ICD-10-CM

## 2013-06-25 DIAGNOSIS — IMO0001 Reserved for inherently not codable concepts without codable children: Secondary | ICD-10-CM | POA: Diagnosis present

## 2013-06-25 DIAGNOSIS — E876 Hypokalemia: Secondary | ICD-10-CM | POA: Diagnosis present

## 2013-06-25 DIAGNOSIS — R109 Unspecified abdominal pain: Secondary | ICD-10-CM

## 2013-06-25 DIAGNOSIS — N183 Chronic kidney disease, stage 3 unspecified: Secondary | ICD-10-CM | POA: Diagnosis present

## 2013-06-25 DIAGNOSIS — A09 Infectious gastroenteritis and colitis, unspecified: Secondary | ICD-10-CM | POA: Diagnosis present

## 2013-06-25 DIAGNOSIS — R7402 Elevation of levels of lactic acid dehydrogenase (LDH): Secondary | ICD-10-CM | POA: Diagnosis present

## 2013-06-25 DIAGNOSIS — R740 Nonspecific elevation of levels of transaminase and lactic acid dehydrogenase [LDH]: Secondary | ICD-10-CM

## 2013-06-25 DIAGNOSIS — Z791 Long term (current) use of non-steroidal anti-inflammatories (NSAID): Secondary | ICD-10-CM

## 2013-06-25 DIAGNOSIS — R630 Anorexia: Secondary | ICD-10-CM | POA: Diagnosis present

## 2013-06-25 DIAGNOSIS — G8929 Other chronic pain: Secondary | ICD-10-CM | POA: Diagnosis present

## 2013-06-25 DIAGNOSIS — N189 Chronic kidney disease, unspecified: Secondary | ICD-10-CM

## 2013-06-25 DIAGNOSIS — E039 Hypothyroidism, unspecified: Secondary | ICD-10-CM | POA: Diagnosis present

## 2013-06-25 DIAGNOSIS — Z6834 Body mass index (BMI) 34.0-34.9, adult: Secondary | ICD-10-CM

## 2013-06-25 DIAGNOSIS — R7401 Elevation of levels of liver transaminase levels: Secondary | ICD-10-CM | POA: Diagnosis present

## 2013-06-25 DIAGNOSIS — M069 Rheumatoid arthritis, unspecified: Secondary | ICD-10-CM | POA: Diagnosis present

## 2013-06-25 DIAGNOSIS — E785 Hyperlipidemia, unspecified: Secondary | ICD-10-CM | POA: Diagnosis present

## 2013-06-25 DIAGNOSIS — K589 Irritable bowel syndrome without diarrhea: Secondary | ICD-10-CM | POA: Diagnosis present

## 2013-06-25 DIAGNOSIS — M329 Systemic lupus erythematosus, unspecified: Secondary | ICD-10-CM | POA: Diagnosis present

## 2013-06-25 DIAGNOSIS — N179 Acute kidney failure, unspecified: Principal | ICD-10-CM | POA: Diagnosis present

## 2013-06-25 DIAGNOSIS — Z96659 Presence of unspecified artificial knee joint: Secondary | ICD-10-CM

## 2013-06-25 LAB — CREATININE, SERUM
Creatinine, Ser: 1.37 mg/dL — ABNORMAL HIGH (ref 0.50–1.10)
GFR calc Af Amer: 51 mL/min — ABNORMAL LOW (ref >90)
GFR calc non Af Amer: 44 mL/min — ABNORMAL LOW (ref >90)

## 2013-06-25 LAB — LIPID PANEL
CHOL/HDL RATIO: 5 ratio
Cholesterol: 289 mg/dL — ABNORMAL HIGH (ref 0–200)
HDL: 58 mg/dL (ref >39)
LDL Cholesterol: 201 mg/dL — ABNORMAL HIGH (ref 0–99)
Triglycerides: 152 mg/dL — ABNORMAL HIGH (ref <150)
VLDL: 30 mg/dL (ref 0–40)

## 2013-06-25 LAB — CBC WITH DIFFERENTIAL/PLATELET
BASOS ABS: 0 10*3/uL (ref 0.0–0.1)
Basophils Relative: 0 % (ref 0–1)
EOS PCT: 7 % — AB (ref 0–5)
Eosinophils Absolute: 0.5 10*3/uL (ref 0.0–0.7)
HEMATOCRIT: 39.2 % (ref 36.0–46.0)
Hemoglobin: 13.2 g/dL (ref 12.0–15.0)
LYMPHS ABS: 1.8 10*3/uL (ref 0.7–4.0)
Lymphocytes Relative: 25 % (ref 12–46)
MCH: 31.9 pg (ref 26.0–34.0)
MCHC: 33.7 g/dL (ref 30.0–36.0)
MCV: 94.7 fL (ref 78.0–100.0)
MONO ABS: 0.4 10*3/uL (ref 0.1–1.0)
Monocytes Relative: 5 % (ref 3–12)
Neutro Abs: 4.7 10*3/uL (ref 1.7–7.7)
Neutrophils Relative %: 63 % (ref 43–77)
PLATELETS: 209 10*3/uL (ref 150–400)
RBC: 4.14 MIL/uL (ref 3.87–5.11)
RDW: 14.6 % (ref 11.5–15.5)
WBC: 7.5 10*3/uL (ref 4.0–10.5)

## 2013-06-25 LAB — URINALYSIS, ROUTINE W REFLEX MICROSCOPIC
Bilirubin Urine: NEGATIVE
GLUCOSE, UA: NEGATIVE mg/dL
HGB URINE DIPSTICK: NEGATIVE
Ketones, ur: NEGATIVE mg/dL
Nitrite: NEGATIVE
PH: 6.5 (ref 5.0–8.0)
Protein, ur: NEGATIVE mg/dL
SPECIFIC GRAVITY, URINE: 1.015 (ref 1.005–1.030)
Urobilinogen, UA: 1 mg/dL (ref 0.0–1.0)

## 2013-06-25 LAB — COMPREHENSIVE METABOLIC PANEL
ALT: 38 U/L — AB (ref 0–35)
AST: 33 U/L (ref 0–37)
Albumin: 4.5 g/dL (ref 3.5–5.2)
Alkaline Phosphatase: 104 U/L (ref 39–117)
BILIRUBIN TOTAL: 0.6 mg/dL (ref 0.3–1.2)
BUN: 13 mg/dL (ref 6–23)
CALCIUM: 9.8 mg/dL (ref 8.4–10.5)
CHLORIDE: 102 meq/L (ref 96–112)
CO2: 21 mEq/L (ref 19–32)
Creatinine, Ser: 1.34 mg/dL — ABNORMAL HIGH (ref 0.50–1.10)
GFR calc non Af Amer: 45 mL/min — ABNORMAL LOW (ref >90)
GFR, EST AFRICAN AMERICAN: 53 mL/min — AB (ref >90)
Glucose, Bld: 88 mg/dL (ref 70–99)
Potassium: 4 mEq/L (ref 3.7–5.3)
SODIUM: 139 meq/L (ref 137–147)
Total Protein: 8.4 g/dL — ABNORMAL HIGH (ref 6.0–8.3)

## 2013-06-25 LAB — CBC
HEMATOCRIT: 33.7 % — AB (ref 36.0–46.0)
HEMOGLOBIN: 11.4 g/dL — AB (ref 12.0–15.0)
MCH: 32.2 pg (ref 26.0–34.0)
MCHC: 33.8 g/dL (ref 30.0–36.0)
MCV: 95.2 fL (ref 78.0–100.0)
Platelets: 217 10*3/uL (ref 150–400)
RBC: 3.54 MIL/uL — AB (ref 3.87–5.11)
RDW: 14.7 % (ref 11.5–15.5)
WBC: 7.4 10*3/uL (ref 4.0–10.5)

## 2013-06-25 LAB — GLUCOSE, CAPILLARY
GLUCOSE-CAPILLARY: 102 mg/dL — AB (ref 70–99)
GLUCOSE-CAPILLARY: 92 mg/dL (ref 70–99)

## 2013-06-25 LAB — TSH: TSH: >100 u[IU]/mL — ABNORMAL HIGH (ref 0.350–4.500)

## 2013-06-25 LAB — CK: CK TOTAL: 191 U/L — AB (ref 7–177)

## 2013-06-25 LAB — URINE MICROSCOPIC-ADD ON

## 2013-06-25 LAB — LIPASE, BLOOD: Lipase: 22 U/L (ref 11–59)

## 2013-06-25 MED ORDER — SODIUM CHLORIDE 0.9 % IV BOLUS (SEPSIS)
1000.0000 mL | Freq: Once | INTRAVENOUS | Status: AC
  Administered 2013-06-25: 1000 mL via INTRAVENOUS

## 2013-06-25 MED ORDER — HEPARIN SODIUM (PORCINE) 5000 UNIT/ML IJ SOLN
5000.0000 [IU] | Freq: Three times a day (TID) | INTRAMUSCULAR | Status: DC
  Administered 2013-06-25 – 2013-06-28 (×8): 5000 [IU] via SUBCUTANEOUS
  Filled 2013-06-25 (×11): qty 1

## 2013-06-25 MED ORDER — CIPROFLOXACIN IN D5W 400 MG/200ML IV SOLN
400.0000 mg | Freq: Once | INTRAVENOUS | Status: AC
  Administered 2013-06-25: 400 mg via INTRAVENOUS
  Filled 2013-06-25: qty 200

## 2013-06-25 MED ORDER — ONDANSETRON HCL 4 MG/2ML IJ SOLN
4.0000 mg | Freq: Once | INTRAMUSCULAR | Status: AC
  Administered 2013-06-25: 4 mg via INTRAVENOUS
  Filled 2013-06-25: qty 2

## 2013-06-25 MED ORDER — METOCLOPRAMIDE HCL 5 MG/ML IJ SOLN
10.0000 mg | Freq: Once | INTRAMUSCULAR | Status: AC
  Administered 2013-06-25: 10 mg via INTRAVENOUS
  Filled 2013-06-25: qty 2

## 2013-06-25 MED ORDER — MORPHINE SULFATE 2 MG/ML IJ SOLN: 2.0000 mg | INTRAMUSCULAR | Status: DC | PRN

## 2013-06-25 MED ORDER — SODIUM CHLORIDE 0.9 % IV SOLN
INTRAVENOUS | Status: DC
  Administered 2013-06-25 – 2013-06-26 (×3): via INTRAVENOUS

## 2013-06-25 MED ORDER — HYDROMORPHONE HCL PF 1 MG/ML IJ SOLN
1.0000 mg | Freq: Once | INTRAMUSCULAR | Status: AC
  Administered 2013-06-25: 1 mg via INTRAVENOUS
  Filled 2013-06-25: qty 1

## 2013-06-25 MED ORDER — PROMETHAZINE HCL 25 MG RE SUPP: 25.0000 mg | Freq: Four times a day (QID) | RECTAL | Status: DC | PRN

## 2013-06-25 MED ORDER — PANTOPRAZOLE SODIUM 40 MG PO TBEC
40.0000 mg | DELAYED_RELEASE_TABLET | Freq: Every day | ORAL | Status: DC
  Administered 2013-06-26 – 2013-06-28 (×3): 40 mg via ORAL
  Filled 2013-06-25 (×3): qty 1

## 2013-06-25 MED ORDER — LEVOTHYROXINE SODIUM 200 MCG PO TABS
325.0000 ug | ORAL_TABLET | Freq: Every day | ORAL | Status: DC
  Administered 2013-06-26 – 2013-06-28 (×3): 325 ug via ORAL
  Filled 2013-06-25 (×4): qty 1

## 2013-06-25 MED ORDER — INSULIN ASPART 100 UNIT/ML ~~LOC~~ SOLN: 0.0000 [IU] | SUBCUTANEOUS | Status: DC

## 2013-06-25 MED ORDER — METRONIDAZOLE IN NACL 5-0.79 MG/ML-% IV SOLN
500.0000 mg | Freq: Three times a day (TID) | INTRAVENOUS | Status: DC
Start: 2013-06-25 — End: 2013-06-28
  Administered 2013-06-25 – 2013-06-28 (×9): 500 mg via INTRAVENOUS
  Filled 2013-06-25 (×10): qty 100

## 2013-06-25 MED ORDER — OXYCODONE HCL 5 MG PO TABS
5.0000 mg | ORAL_TABLET | ORAL | Status: DC | PRN
  Administered 2013-06-25 – 2013-06-28 (×14): 5 mg via ORAL
  Filled 2013-06-25 (×17): qty 1

## 2013-06-25 MED ORDER — ONDANSETRON HCL 4 MG PO TABS
4.0000 mg | ORAL_TABLET | Freq: Four times a day (QID) | ORAL | Status: DC | PRN
  Administered 2013-06-26: 4 mg via ORAL
  Filled 2013-06-25: qty 1

## 2013-06-25 MED ORDER — CIPROFLOXACIN IN D5W 400 MG/200ML IV SOLN
400.0000 mg | Freq: Two times a day (BID) | INTRAVENOUS | Status: DC
  Administered 2013-06-25 – 2013-06-28 (×6): 400 mg via INTRAVENOUS
  Filled 2013-06-25 (×7): qty 200

## 2013-06-25 MED ORDER — ONDANSETRON HCL 4 MG/2ML IJ SOLN: 4.0000 mg | Freq: Four times a day (QID) | INTRAMUSCULAR | Status: DC | PRN

## 2013-06-25 MED ORDER — LEVOTHYROXINE SODIUM 150 MCG PO TABS: 300.0000 ug | ORAL_TABLET | Freq: Every day | ORAL | Status: DC

## 2013-06-25 MED ORDER — METRONIDAZOLE IN NACL 5-0.79 MG/ML-% IV SOLN
500.0000 mg | Freq: Once | INTRAVENOUS | Status: AC
  Administered 2013-06-25: 500 mg via INTRAVENOUS
  Filled 2013-06-25: qty 100

## 2013-06-25 MED ORDER — GABAPENTIN 600 MG PO TABS
600.0000 mg | ORAL_TABLET | Freq: Three times a day (TID) | ORAL | Status: DC
  Administered 2013-06-25 – 2013-06-28 (×8): 600 mg via ORAL
  Filled 2013-06-25 (×11): qty 1

## 2013-06-25 MED ORDER — METOCLOPRAMIDE HCL 10 MG PO TABS
10.0000 mg | ORAL_TABLET | Freq: Four times a day (QID) | ORAL | Status: DC
  Administered 2013-06-26 – 2013-06-28 (×8): 10 mg via ORAL
  Filled 2013-06-25 (×16): qty 1

## 2013-06-25 MED ORDER — LEVOTHYROXINE SODIUM 25 MCG PO TABS: 25.0000 ug | ORAL_TABLET | Freq: Every day | ORAL | Status: DC

## 2013-06-25 NOTE — ED Provider Notes (Signed)
CSN: [id number]     Arrival date & time 06/25/13  1211 History   First MD Initiated Contact with Patient 06/25/13 1334     Chief Complaint  Patient presents with  . Abdominal Pain     Patient is a 48 year old female with past medical history as below for chronic abdominal pain, fibromyalgia, prior diverticulitis, and degenerative disc disease who presents with abdominal pain. Abdominal pain has been present for one week. It is constant, severe in severity, and sharp in quality. She denies any trauma or inciting event. Other symptoms include numerous bouts of nonbloody diarrhea, as well as 8-10 episodes of nonbloody nonbilious emesis. Patient has been seen here 3 times in the last week for similar symptoms. Labs have been unremarkable and after her IV fluids, antinausea essence, and IV narcotics patient feels better. Last CT scan was in February of 2015 that showed duodenitis/peptic ulcer disease.    (Consider location/radiation/quality/duration/timing/severity/associated sxs/prior Treatment) Patient is a 48 y.o. female presenting with abdominal pain. The history is provided by the patient and medical records. No language interpreter was used.  Abdominal Pain Pain location:  RLQ, LLQ and suprapubic Pain quality: aching   Pain radiates to:  Does not radiate Pain severity:  Severe Onset quality:  Gradual Duration:  7 days Timing:  Constant Progression:  Worsening Context: not alcohol use, not diet changes, not sick contacts and not suspicious food intake   Relieved by:  Nothing Worsened by:  Movement, palpation, position changes and vomiting Ineffective treatments:  OTC medications, movement, palpation and vomiting Associated symptoms: anorexia, diarrhea, nausea and vomiting   Associated symptoms: no chest pain, no chills, no constipation, no fever, no hematochezia, no melena, no shortness of breath, no vaginal bleeding and no vaginal discharge     Past Medical History  Diagnosis Date   . Rheumatoid arthritis   . Gastroparesis   . Degenerative disc disease   . Fibromyalgia   . Diverticulitis   . Thyroid disease   . Lupus   . Porphyria    Past Surgical History  Procedure Laterality Date  . Cesarean section    . Replacement total knee Bilateral   . Abdominal hysterectomy    . Cholecystectomy     Family History  Problem Relation Age of Onset  . Leukemia Mother    History  Substance Use Topics  . Smoking status: Never Smoker   . Smokeless tobacco: Not on file  . Alcohol Use: Yes     Comment: social   OB History   Grav Para Term Preterm Abortions TAB SAB Ect Mult Living                 Review of Systems  Constitutional: Negative for fever and chills.  Respiratory: Negative for shortness of breath.   Cardiovascular: Negative for chest pain.  Gastrointestinal: Positive for nausea, vomiting, abdominal pain, diarrhea and anorexia. Negative for constipation, melena and hematochezia.  Genitourinary: Negative for vaginal bleeding and vaginal discharge.  All other systems reviewed and are negative.     Allergies  Methotrexate derivatives; Morphine and related; and Relafen  Home Medications   Prior to Admission medications   Medication Sig Start Date End Date Taking? Authorizing Provider  gabapentin (NEURONTIN) 600 MG tablet Take 600 mg by mouth 3 (three) times daily.   Yes Historical Provider, MD  levothyroxine (SYNTHROID, LEVOTHROID) 25 MCG tablet Take 25 mcg by mouth daily before breakfast. Take with 340mcg to = 335mcg total   Yes Historical Provider, MD  levothyroxine (SYNTHROID, LEVOTHROID) 300 MCG tablet Take 300 mcg by mouth daily before breakfast. Take with a 37mcg tablet to = 379mcg   Yes Historical Provider, MD  methocarbamol (ROBAXIN) 500 MG tablet Take 500 mg by mouth 4 (four) times daily.   Yes Historical Provider, MD  metoCLOPramide (REGLAN) 10 MG tablet Take 1 tablet (10 mg total) by mouth 4 (four) times daily. 05/03/13  Yes Carlisle Cater,  PA-C  naproxen sodium (ALEVE) 220 MG tablet Take 440 mg by mouth daily as needed (arthritis pain).   Yes Historical Provider, MD  omeprazole (PRILOSEC) 20 MG capsule Take 1 capsule (20 mg total) by mouth 2 (two) times daily before a meal. 03/19/13  Yes Lauren Burnetta Sabin, PA-C  ondansetron (ZOFRAN ODT) 4 MG disintegrating tablet Take 1 tablet (4 mg total) by mouth every 8 (eight) hours as needed for nausea or vomiting. 03/19/13  Yes Lauren Burnetta Sabin, PA-C  oxyCODONE (OXY IR/ROXICODONE) 5 MG immediate release tablet Take 5 mg by mouth every 4 (four) hours as needed for severe pain.   Yes Historical Provider, MD  promethazine (PHENERGAN) 25 MG suppository Place 25 mg rectally every 6 (six) hours as needed for nausea or vomiting.   Yes Historical Provider, MD  promethazine (PHENERGAN) 25 MG tablet Take 25 mg by mouth 2 (two) times daily as needed for nausea.    Yes Historical Provider, MD  tizanidine (ZANAFLEX) 2 MG capsule Take 2-4 mg by mouth 3 (three) times daily as needed for muscle spasms.  09/29/12  Yes Historical Provider, MD   BP 144/108  Pulse 101  Temp(Src) 98 F (36.7 C) (Oral)  Resp 18  Ht 5\' 1"  (1.549 m)  Wt 185 lb (83.915 kg)  BMI 34.97 kg/m2  SpO2 98% Physical Exam  Nursing note and vitals reviewed. Constitutional: She is oriented to person, place, and time. She appears well-developed and well-nourished. No distress.  HENT:  Head: Normocephalic and atraumatic.  Right Ear: External ear normal.  Left Ear: External ear normal.  Nose: Nose normal.  Mouth/Throat: Oropharynx is clear and moist.  Eyes: Conjunctivae and EOM are normal. Pupils are equal, round, and reactive to light.  Neck: Normal range of motion. Neck supple.  Cardiovascular: Normal rate, regular rhythm, normal heart sounds and intact distal pulses.   During my exam HR less than 100  Pulmonary/Chest: Effort normal and breath sounds normal. No respiratory distress.  Abdominal: Soft. Bowel sounds are normal. She exhibits no  distension and no mass. There is tenderness (moderate TTP over LLQ). There is no rebound and no guarding.  Musculoskeletal: Normal range of motion. She exhibits no edema and no tenderness.  Neurological: She is alert and oriented to person, place, and time. She has normal reflexes.  Skin: Skin is warm and dry.  Psychiatric: She has a normal mood and affect.    ED Course  Procedures (including critical care time) Labs Review Labs Reviewed  CBC WITH DIFFERENTIAL - Abnormal; Notable for the following:    Eosinophils Relative 7 (*)    All other components within normal limits  COMPREHENSIVE METABOLIC PANEL - Abnormal; Notable for the following:    Creatinine, Ser 1.34 (*)    Total Protein 8.4 (*)    ALT 38 (*)    GFR calc non Af Amer 45 (*)    GFR calc Af Amer 53 (*)    All other components within normal limits  LIPASE, BLOOD  URINALYSIS, ROUTINE W REFLEX MICROSCOPIC    Imaging Review Ct  Abdomen Pelvis Wo Contrast  06/25/2013   CLINICAL DATA:  Recurrent episodes abdominal pain, vomiting and diarrhea for several years.  EXAM: CT ABDOMEN AND PELVIS WITHOUT CONTRAST  TECHNIQUE: Multidetector CT imaging of the abdomen and pelvis was performed following the standard protocol without IV contrast.  COMPARISON:  05/04/2013 CT.  FINDINGS: Portions of bowel are under distended and evaluation limited. Minimal haziness of fat planes surrounding the ascending colon appears slightly different than on prior examination. This may indicate minimal inflammation of the ascending colon. No inflammation surrounds the terminal ileum or appendix. No free intraperitoneal air.  Age advanced atherosclerotic type changes with coronary artery calcification, calcification of the abdominal aorta and and portions of the iliac arteries. No abdominal aortic aneurysm.  Fatty infiltration the liver. Taking into account limitation by non contrast imaging, no worrisome hepatic, splenic, pancreatic, adrenal or renal lesion. Post  cholecystectomy.  No adenopathy.  Lung bases clear.  Mild degenerative changes L5-S1.  Post hysterectomy.  Decompressed urinary bladder without gross abnormality.  IMPRESSION: Minimal haziness of fat planes surrounding the ascending colon appears slightly different than on prior examination. This may indicate minimal inflammation of the ascending colon.  Atherosclerotic type changes as detailed above.   Electronically Signed   By: Chauncey Cruel M.D.   On: 06/25/2013 15:51     EKG Interpretation None      MDM   Final diagnoses:  Colitis, infectious  AKI (acute kidney injury)  Infectious colitis  Transaminitis  Unspecified hypothyroidism    Patient presents to the ED with complaints of abdominal pain, vomiting, and diarrhea.  VS remarkable for HR in the 100s upon arrival but no fever and no tachycardia.  PE as above and remarkable for moderate LLQ abdominal TTP and mildly dry mucus membranes.  Due to concern for possible diverticulitis/UTI.  Labs, urine samples, and CT of abd/pel w/o (elevated Cr yesterday) were completed.  Review of results shows persistently elevated Cr and CT scan c/w colitis.  Patient reassessed and still unable to tolerate PO intake.  Thus, will admit for IV cipro/flagyl, IVF, and pain control.  Hospitalist consulted and patient admitted without acute events       Corlis Leak, MD 06/25/13 2107

## 2013-06-25 NOTE — ED Notes (Signed)
  Pt transported to ct 

## 2013-06-25 NOTE — ED Provider Notes (Addendum)
Patient seen and examined. History of recurrent episodes of abdominal pain, vomiting, and diarrhea, all for several years. History of many diagnoses. Fibromyalgia, chronic abdominal pain, gastroparesis, and also acute intermittent porphyria. She states she was seen by oncology and hematology 7-8 years ago when her symptoms started and was told this was porphyria and that would cause recurrent episodes of pain.  She's had several visits to ERs last week. One while visiting in [place] and was in the emergency room there for fluids and symptom control.  No fever. No blood in her emesis or stools now states she has a times in the past. She's been told at one point she had an episode of diverticulitis. Her primary care physician had a port placed by general surgery and ORIF patient could go have outpatient studies with GI for endoscopy colonoscopy. These are pending 4 days or now. Her symptoms worsened today and she presents here. Seen yesterday. Had symptom improvement was discharged.  Her abdomen has diffuse LQ tenderness, without peritoneal irritation.  CT non contrast requested 2/2 history of diverticulitis.  Tanna Furry, MD 06/25/13 [place], MD 06/25/13 [phone]

## 2013-06-25 NOTE — H&P (Addendum)
[facility] History and Physical  Adrienne Flores:[id number] DOB: 1965/06/17 DOA: 06/25/2013  Referring physician: Dr. Aline Brochure PCP: [name], MD   Chief Complaint: abdominal pain  HPI: Adrienne Flores is a 48 y.o. female  Past medical history of hypothyroidism, chronic abdominal pain, prior diverticulitis who comes to the emergency room for abdominal pain that has been present in 1 week prior to admission. Progressively getting worse and constant. She has been anorexic. With diarrhea. No blood in her stools. She relates she's had been having 8-10 bowel movements a day. She has been seen in the ED 3 times and her labs have been benign. Her abdominal pain during these times have improved with IV fluids antiemetics and narcotics. But she said that today the IV narcotics and nausea medications are not helping. He denies any fever, chills or chest pain. She relates a lot of nausea but no vomiting.  In the ED: A basic metabolic panel was done that shows new acute kidney injury (baseline 0.7-0.9) afebrile, no leukocytosis and a CT scan has been also were consulted for further evaluation.   Review of Systems:  Constitutional:  No weight loss, night sweats, Fevers, chills, fatigue.  HEENT:  No headaches, Difficulty swallowing,Tooth/dental problems,Sore throat,  No sneezing, itching, ear ache, nasal congestion, post nasal drip,  Cardio-vascular:  No chest pain, Orthopnea, PND, swelling in lower extremities, anasarca, dizziness, palpitations  Resp:  No shortness of breath with exertion or at rest. No excess mucus, no productive cough, No non-productive cough, No coughing up of blood.No change in color of mucus.No wheezing.No chest wall deformity  Skin:  no rash or lesions.  GU:  no dysuria, change in color of urine, no urgency or frequency. No flank pain.  Musculoskeletal:  No joint pain or swelling. No decreased range of motion. No back pain.  Psych:  No change in mood  or affect. No depression or anxiety. No memory loss.   Past Medical History  Diagnosis Date  . Rheumatoid arthritis   . Gastroparesis   . Degenerative disc disease   . Fibromyalgia   . Diverticulitis   . Thyroid disease   . Lupus   . Porphyria    Past Surgical History  Procedure Laterality Date  . Cesarean section    . Replacement total knee Bilateral   . Abdominal hysterectomy    . Cholecystectomy     Social History:  reports that she has never smoked. She does not have any smokeless tobacco history on file. She reports that she drinks alcohol. She reports that she does not use illicit drugs.  Allergies  Allergen Reactions  . Methotrexate Derivatives Other (See Comments)    headaches  . Morphine And Related Hives and Itching  . Relafen [Nabumetone] Other (See Comments)    headaches    Family History  Problem Relation Age of Onset  . Leukemia Mother      Prior to Admission medications   Medication Sig Start Date End Date Taking? Authorizing Provider  gabapentin (NEURONTIN) 600 MG tablet Take 600 mg by mouth 3 (three) times daily.   Yes Historical Provider, MD  levothyroxine (SYNTHROID, LEVOTHROID) 25 MCG tablet Take 25 mcg by mouth daily before breakfast. Take with 373mcg to = 372mcg total   Yes Historical Provider, MD  levothyroxine (SYNTHROID, LEVOTHROID) 300 MCG tablet Take 300 mcg by mouth daily before breakfast. Take with a 14mcg tablet to = 332mcg   Yes Historical Provider, MD  methocarbamol (ROBAXIN) 500 MG tablet Take  500 mg by mouth 4 (four) times daily.   Yes Historical Provider, MD  metoCLOPramide (REGLAN) 10 MG tablet Take 1 tablet (10 mg total) by mouth 4 (four) times daily. 05/03/13  Yes Carlisle Cater, PA-C  naproxen sodium (ALEVE) 220 MG tablet Take 440 mg by mouth daily as needed (arthritis pain).   Yes Historical Provider, MD  omeprazole (PRILOSEC) 20 MG capsule Take 1 capsule (20 mg total) by mouth 2 (two) times daily before a meal. 03/19/13  Yes Lauren Burnetta Sabin, PA-C  ondansetron (ZOFRAN ODT) 4 MG disintegrating tablet Take 1 tablet (4 mg total) by mouth every 8 (eight) hours as needed for nausea or vomiting. 03/19/13  Yes Lauren Burnetta Sabin, PA-C  oxyCODONE (OXY IR/ROXICODONE) 5 MG immediate release tablet Take 5 mg by mouth every 4 (four) hours as needed for severe pain.   Yes Historical Provider, MD  promethazine (PHENERGAN) 25 MG suppository Place 25 mg rectally every 6 (six) hours as needed for nausea or vomiting.   Yes Historical Provider, MD  promethazine (PHENERGAN) 25 MG tablet Take 25 mg by mouth 2 (two) times daily as needed for nausea.    Yes Historical Provider, MD  tizanidine (ZANAFLEX) 2 MG capsule Take 2-4 mg by mouth 3 (three) times daily as needed for muscle spasms.  09/29/12  Yes Historical Provider, MD   Physical Exam: Filed Vitals:   06/25/13 1651  BP:   Pulse: 89  Temp:   Resp:     BP 92/45  Pulse 89  Temp(Src) 98 F (36.7 C) (Oral)  Resp 18  Ht 5\' 1"  (1.549 m)  Wt 83.915 kg (185 lb)  BMI 34.97 kg/m2  SpO2 99%  General:  Appears calm and comfortable Eyes: PERRL, normal lids, irises & conjunctiva ENT: grossly normal hearing, lips & tongue Neck: no LAD, masses or thyromegaly Cardiovascular: RRR, no m/r/g. No LE edema. Respiratory: CTA bilaterally, no w/r/r. Normal respiratory effort. Abdomen: Soft mildly tender in the left lower prodromal rebound or guarding. Skin: She has a macular papular scaly rash diffuse mostly in her trunk which is a been present for over a year. Musculoskeletal: grossly normal tone BUE/BLE Psychiatric: grossly normal mood and affect, speech fluent and appropriate Neurologic: grossly non-focal.          Labs on Admission:  Basic Metabolic Panel:  Recent Labs Lab 06/21/13 1723 06/24/13 1645 06/25/13 1342  NA 140 141 139  K 3.7 3.3* 4.0  CL 100 102 102  CO2 21 20 21   GLUCOSE 87 87 88  BUN 15 17 13   CREATININE 1.17* 1.40* 1.34*  CALCIUM 9.4 9.6 9.8   Liver Function  Tests:  Recent Labs Lab 06/21/13 1723 06/24/13 1645 06/25/13 1342  AST 37 40* 33  ALT 38* 45* 38*  ALKPHOS 93 103 104  BILITOT 0.6 0.8 0.6  PROT 8.0 8.1 8.4*  ALBUMIN 4.3 4.5 4.5    Recent Labs Lab 06/21/13 1723 06/24/13 1645 06/25/13 1342  LIPASE 10* 15 22   No results found for this basename: AMMONIA,  in the last 168 hours CBC:  Recent Labs Lab 06/21/13 1723 06/24/13 1645 06/25/13 1342  WBC 7.3 8.2 7.5  NEUTROABS  --  5.9 4.7  HGB 13.0 12.6 13.2  HCT 39.5 37.0 39.2  MCV 94.5 93.0 94.7  PLT 193 226 209   Cardiac Enzymes: No results found for this basename: CKTOTAL, CKMB, CKMBINDEX, TROPONINI,  in the last 168 hours  BNP (last 3 results) No results found for  this basename: PROBNP,  in the last 8760 hours CBG: No results found for this basename: GLUCAP,  in the last 168 hours  Radiological Exams on Admission: Ct Abdomen Pelvis Wo Contrast  06/25/2013   CLINICAL DATA:  Recurrent episodes abdominal pain, vomiting and diarrhea for several years.  EXAM: CT ABDOMEN AND PELVIS WITHOUT CONTRAST  TECHNIQUE: Multidetector CT imaging of the abdomen and pelvis was performed following the standard protocol without IV contrast.  COMPARISON:  05/04/2013 CT.  FINDINGS: Portions of bowel are under distended and evaluation limited. Minimal haziness of fat planes surrounding the ascending colon appears slightly different than on prior examination. This may indicate minimal inflammation of the ascending colon. No inflammation surrounds the terminal ileum or appendix. No free intraperitoneal air.  Age advanced atherosclerotic type changes with coronary artery calcification, calcification of the abdominal aorta and and portions of the iliac arteries. No abdominal aortic aneurysm.  Fatty infiltration the liver. Taking into account limitation by non contrast imaging, no worrisome hepatic, splenic, pancreatic, adrenal or renal lesion. Post cholecystectomy.  No adenopathy.  Lung bases clear.   Mild degenerative changes L5-S1.  Post hysterectomy.  Decompressed urinary bladder without gross abnormality.  IMPRESSION: Minimal haziness of fat planes surrounding the ascending colon appears slightly different than on prior examination. This may indicate minimal inflammation of the ascending colon.  Atherosclerotic type changes as detailed above.   Electronically Signed   By: Chauncey Cruel M.D.   On: 06/25/2013 15:51    EKG: Independently reviewed. none  Assessment/Plan Colitis, infectious: - She has no leukocytosis, has remained afebrile. She has abdominal pain to palpation with a CT scan that shows haziness of the fat plane surrounding the ascending colon. I will start her on ciprofloxacin and Flagyl. Place her n.p.o. start her on IV fluids. Her hemoglobin seems to be stable.   - There is a chance that this also being ischemic colitis is unlikely as she has had no episodes of hypotension. No blood in her stool, she does not relate cramping abdominal pain and is mostly constant. Check a CPK. She has no acidosis.  AKI (acute kidney injury) - This most likely due to diarrhea and decreased oral intake. - Prerenal azotemia, started on IV fluids check a basic metabolic panel in the morning.  Unspecified hypothyroidism - She is on a significant dose of levothyroxine 325 mcg. Has been going to the chart and she has always been on high-dose. - Back in October 2014 her TSH was 81.5, recheck a TSH. Makes me concern of noncompliance with her medications. - Also hypothyroidism could be contributing to her abdominal pain. Check a CK level. - Check fasting lipid panel.  Transaminitis: - Unclear etiology of her elevated LFTs. Her bilirubin is within normal limits along with her alkaline phosphatase. She has an elevated ALT more than AST. I will go ahead and check a hepatitis panel. She does have a macular rash diffuse to rule out hepatitis C. Check abdominal ultrasound to rule out fatty liver which is the  most likely cause. - Also contributing to the elevation of her LFTs to be hypothyroidism. - Check a random urine for porphobilinogen and Porphyrins. 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   Code Status: full Family Communication: husband Disposition Plan: inpatient  Time spent: 33 minutes  [facility] Pager [phone]

## 2013-06-25 NOTE — Progress Notes (Signed)
Admission note:  Arrival Method: Patient came from ED on stretcher accompanying with her husband and a staff member. Mental Orientation:  Alert and oriented x 4.   Assessment: See doc flow sheets Skin: Warm, dry and Intact skin noted. IV: Right port and infusing Pain: None.  Fall Prevention Safety Plan: Moderate fall risk , non-slip socks on, bed alarm on.  Fall prevention safety plan completed and signed by the patient. Admission Screening: Completed. 6700 Orientation: Patient has been oriented to the unit, staff and to the room.

## 2013-06-25 NOTE — ED Notes (Signed)
Internal Medicine at the bedside assessing patient.

## 2013-06-25 NOTE — ED Notes (Signed)
Patient transported to CT 

## 2013-06-25 NOTE — ED Notes (Addendum)
Seen yesterday for same pain called and got app for Monday but is still feeling bad abd pain still n/v/d

## 2013-06-26 DIAGNOSIS — R109 Unspecified abdominal pain: Secondary | ICD-10-CM

## 2013-06-26 LAB — COMPREHENSIVE METABOLIC PANEL
ALT: 24 U/L (ref 0–35)
AST: 19 U/L (ref 0–37)
Albumin: 3.5 g/dL (ref 3.5–5.2)
Alkaline Phosphatase: 76 U/L (ref 39–117)
BILIRUBIN TOTAL: 0.4 mg/dL (ref 0.3–1.2)
BUN: 10 mg/dL (ref 6–23)
CO2: 22 meq/L (ref 19–32)
CREATININE: 1.31 mg/dL — AB (ref 0.50–1.10)
Calcium: 8.9 mg/dL (ref 8.4–10.5)
Chloride: 107 mEq/L (ref 96–112)
GFR calc non Af Amer: 47 mL/min — ABNORMAL LOW (ref >90)
GFR, EST AFRICAN AMERICAN: 54 mL/min — AB (ref >90)
GLUCOSE: 110 mg/dL — AB (ref 70–99)
Potassium: 3.6 mEq/L — ABNORMAL LOW (ref 3.7–5.3)
Sodium: 142 mEq/L (ref 137–147)
Total Protein: 6.4 g/dL (ref 6.0–8.3)

## 2013-06-26 LAB — CBC
HEMATOCRIT: 31.6 % — AB (ref 36.0–46.0)
Hemoglobin: 10.4 g/dL — ABNORMAL LOW (ref 12.0–15.0)
MCH: 31.3 pg (ref 26.0–34.0)
MCHC: 32.9 g/dL (ref 30.0–36.0)
MCV: 95.2 fL (ref 78.0–100.0)
PLATELETS: 184 10*3/uL (ref 150–400)
RBC: 3.32 MIL/uL — AB (ref 3.87–5.11)
RDW: 15 % (ref 11.5–15.5)
WBC: 5.5 10*3/uL (ref 4.0–10.5)

## 2013-06-26 LAB — HEPATITIS PANEL, ACUTE
HCV Ab: NEGATIVE
HEP B S AG: NEGATIVE
Hep A IgM: NONREACTIVE
Hep B C IgM: NONREACTIVE

## 2013-06-26 LAB — GLUCOSE, CAPILLARY
GLUCOSE-CAPILLARY: 92 mg/dL (ref 70–99)
Glucose-Capillary: 94 mg/dL (ref 70–99)
Glucose-Capillary: 90 mg/dL (ref 70–99)
Glucose-Capillary: 81 mg/dL (ref 70–99)
Glucose-Capillary: 89 mg/dL (ref 70–99)
Glucose-Capillary: 80 mg/dL (ref 70–99)

## 2013-06-26 MED ORDER — HYDROMORPHONE HCL PF 1 MG/ML IJ SOLN
0.5000 mg | Freq: Once | INTRAMUSCULAR | Status: AC
  Administered 2013-06-26: 0.5 mg via INTRAVENOUS
  Filled 2013-06-26: qty 1

## 2013-06-26 MED ORDER — INSULIN ASPART 100 UNIT/ML ~~LOC~~ SOLN: 0.0000 [IU] | Freq: Three times a day (TID) | SUBCUTANEOUS | Status: DC

## 2013-06-26 MED ORDER — SODIUM CHLORIDE 0.9 % IJ SOLN
10.0000 mL | INTRAMUSCULAR | Status: DC | PRN
  Administered 2013-06-28 (×2): 10 mL

## 2013-06-26 MED ORDER — OXYCODONE HCL 5 MG PO TABS
5.0000 mg | ORAL_TABLET | Freq: Once | ORAL | Status: AC
  Administered 2013-06-26: 5 mg via ORAL

## 2013-06-26 NOTE — Progress Notes (Signed)
PROGRESS NOTE  Adrienne Flores PZW:[id number] DOB: 06-30-1965 DOA: 06/25/2013 PCP: [name], MD  Interim history 48 year old female with a history of chronic abdominal pain for the past 7 years admitted on 06/25/2013 with worsening abdominal pain, vomiting, and diarrhea. The patient states that in the past week she has had 3-4 loose bowel movements on a daily basis without any hematochezia or melena. She denies any hematemesis. She has had some subjective chills. Patient also states that she has been taking NSAIDs including Aleve, ibuprofen, and BC powders at home. CT of the abdomen and pelvis in the emergency department revealed some haziness in the fat planes of the ascending colon suggestive of possible inflammation. The patient was previously worked up for porphyria, but urine porphyrins on 11/26/2012 were unremarkable and a 24-hour urine for porphobilinogen was negative. She never followed up with her hematologist. She states that she has an appointment on 06/29/2013 with [facility]. Hepatic panel, lipase, CBC and electrolytes were unremarkable in the emergency department. The patient was admitted due to uncontrolled pain and renal failure.  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 who agreed to see patient -Discussed with the patient to try to minimize opioids -Continue empiric antibiotics for now pending GI evaluation -Previous workup for porphyria including acute intermittent porphyria has been unremarkable as noted in October 2014 -Obtain stool for C.  difficile PCR -Stool pathogen panel -Urinalysis negative for pyuria Hypothyroidism -Suspect poor compliance with levothyroxin as patient states that she frequently misses her doses when she feels "bad" -Continue levothyroxin by mouth for now if patient is able to tolerate by mouth CKD stage III -Baseline creatinine 1.0-1.2 -Continue IV fluids Uncontrolled pain -I discussed with the patient that it is unrealistic to make her pain 0 -Continue oral opioids -IV opioids for severe uncontrolled pain when necessary -Obtain urine drug screen Hyperlipidemia -LDL 201 -Start statin 1 the patient is able to tolerate by mouth   Family Communication:   Pt at beside Disposition Plan:   Home when medically stable   Antibiotics:  Ciprofloxacin 06/25/2013>>>  Metronidazole 06/25/2013>>>    Procedures/Studies: Ct Abdomen Pelvis Wo Contrast  06/25/2013   CLINICAL DATA:  Recurrent episodes abdominal pain, vomiting and diarrhea for several years.  EXAM: CT ABDOMEN AND PELVIS WITHOUT CONTRAST  TECHNIQUE: Multidetector CT imaging of the abdomen and pelvis was performed following the standard protocol without IV contrast.  COMPARISON:  05/04/2013 CT.  FINDINGS: Portions of bowel are under distended and evaluation limited. Minimal haziness of fat planes surrounding the ascending colon appears slightly different than on prior examination. This may indicate minimal inflammation of the ascending colon. No inflammation surrounds the terminal ileum or appendix. No free intraperitoneal air.  Age advanced atherosclerotic type changes with coronary artery calcification, calcification of the abdominal aorta and and portions of the iliac arteries. No abdominal aortic aneurysm.  Fatty infiltration the liver. Taking into account limitation by non contrast imaging, no worrisome hepatic, splenic, pancreatic, adrenal or renal lesion. Post cholecystectomy.  No adenopathy.  Lung bases clear.  Mild degenerative changes L5-S1.   Post hysterectomy.  Decompressed urinary bladder without gross abnormality.  IMPRESSION: Minimal haziness of fat planes surrounding the ascending colon appears slightly different than on prior examination. This may indicate minimal inflammation of the ascending colon.  Atherosclerotic type changes as detailed above.   Electronically Signed   By: Chauncey Cruel M.D.   On: 06/25/2013 15:51   US Abdomen Complete  06/25/2013   CLINICAL DATA:  Elevated liver function tests. Surgical history includes cholecystectomy.  EXAM: ULTRASOUND ABDOMEN COMPLETE  COMPARISON:  CT abdomen and pelvis earlier same date, 05/06/2013, 03/19/2013, 11/24/2012, 03/26/2011. No prior ultrasound.  FINDINGS: Gallbladder:  Surgically absent.  Common bile duct:  Diameter: Approximately 7 mm.  Liver:  Diffusely increased and coarsened echotexture without focal hepatic parenchymal abnormality. Patent portal vein with hepatopetal flow.  IVC:  Patent.  Pancreas:  Suboptimally imaged due to midline bowel gas. The pancreas was atrophic and otherwise unremarkable on the CT earlier today.  Spleen:  Normal size and echotexture without focal parenchymal abnormality.  Right Kidney:  Length: Approximately 9.0 cm. No hydronephrosis. Well-preserved cortex. No shadowing calculi. Normal parenchymal echotexture without focal abnormalities.  Left Kidney:  Length: Approximately 10.4 cm. No hydronephrosis. Well-preserved cortex. No shadowing calculi. Normal parenchymal echotexture without focal abnormalities.  Abdominal aorta:  Normal in caliber throughout its visualized course in the abdomen with evidence of atherosclerosis. Maximum diameter 2.0 cm.  Other findings:  None.  IMPRESSION: 1. Diffuse hepatic steatosis and/or hepatocellular disease. No focal hepatic parenchymal abnormality. 2. No evidence of biliary ductal dilation post cholecystectomy. 3. Suboptimal imaging of the pancreas due to midline bowel gas. The pancreas was atrophic and otherwise unremarkable on  the CT earlier today. 4. Aortic atherosclerosis without aneurysm, advanced for age, as noted on CT earlier today.   Electronically Signed   By: Evangeline Dakin M.D.   On: 06/25/2013 20:01   Dg Abd Acute W/chest  06/17/2013   CLINICAL DATA:  Pain.  EXAM: ACUTE ABDOMEN SERIES (ABDOMEN 2 VIEW & CHEST 1 VIEW)  COMPARISON:  CT ABD-PELV W/ CM dated 05/04/2013  FINDINGS: Chest x-ray reveals no acute cardiopulmonary disease. Port-A-Cath noted with tip projected over superior vena cava.  Soft tissues abdomen are unremarkable. Surgical clips in right upper quadrant. Surgical clip left lower pelvis. The gas pattern is nonspecific. No free air. No acute bony abnormality.  IMPRESSION: Negative abdominal radiographs.  No acute cardiopulmonary disease.   Electronically Signed   By: Marcello Moores  Register   On: 06/17/2013 08:46         Subjective: Patient complains of abdominal pain. She states that she has had 2 loose bowel movements since admission. No emesis. Denies any fevers, chills, chest pain, shortness breath, dysuria, hematuria.  Objective: Filed Vitals:   06/25/13 1651 06/25/13 1730 06/25/13 2049 06/26/13 0431  BP:  105/75 114/78 93/63  Pulse: 89 87 77 76  Temp:  98.4 F (36.9 C) 97.7 F (36.5 C) 97.8 F (36.6 C)  TempSrc:  Oral Oral Oral  Resp:  18 16 16   Height:      Weight:   85.594 kg (188 lb 11.2 oz)   SpO2: 99% 99% 94% 96%    Intake/Output Summary (Last 24 hours) at 06/26/13 0845 Last data filed at 06/26/13 0600  Gross per 24 hour  Intake 1452.5 ml  Output    700 ml  Net  752.5 ml   Weight change:  Exam:   General:  Pt is alert, follows commands appropriately, not in acute distress  HEENT: No icterus, No thrush,  [place]/AT  Cardiovascular: RRR, S1/S2, no rubs, no gallops  Respiratory: CTA bilaterally, no wheezing, no crackles, no rhonchi  Abdomen: Soft/+BS, diffusely tender but no guarding, rebound, peritoneal signs, non distended  Extremities: No edema, No lymphangitis, No  petechiae, No rashes, no synovitis  Data Reviewed: Basic Metabolic Panel:  Recent Labs Lab 06/21/13 1723 06/24/13 1645 06/25/13 1342 06/25/13 2024 06/26/13 0620  NA 140 141 139  --  142  K 3.7 3.3* 4.0  --  3.6*  CL 100 102 102  --  107  CO2 21 20 21   --  22  GLUCOSE 87 87 88  --  110*  BUN 15 17 13   --  10  CREATININE 1.17* 1.40* 1.34* 1.37* 1.31*  CALCIUM 9.4 9.6 9.8  --  8.9   Liver Function Tests:  Recent Labs Lab 06/21/13 1723 06/24/13 1645 06/25/13 1342 06/26/13 0620  AST 37 40* 33 19  ALT 38* 45* 38* 24  ALKPHOS 93 103 104 76  BILITOT 0.6 0.8 0.6 0.4  PROT 8.0 8.1 8.4* 6.4  ALBUMIN 4.3 4.5 4.5 3.5    Recent Labs Lab 06/21/13 1723 06/24/13 1645 06/25/13 1342  LIPASE 10* 15 22   No results found for this basename: AMMONIA,  in the last 168 hours CBC:  Recent Labs Lab 06/21/13 1723 06/24/13 1645 06/25/13 1342 06/25/13 2024 06/26/13 0620  WBC 7.3 8.2 7.5 7.4 5.5  NEUTROABS  --  5.9 4.7  --   --   HGB 13.0 12.6 13.2 11.4* 10.4*  HCT 39.5 37.0 39.2 33.7* 31.6*  MCV 94.5 93.0 94.7 95.2 95.2  PLT 193 226 209 217 184   Cardiac Enzymes:  Recent Labs Lab 06/25/13 2024  CKTOTAL 191*   BNP: No components found with this basename: POCBNP,  CBG:  Recent Labs Lab 06/25/13 1742 06/25/13 2047 06/26/13 0005 06/26/13 0429  GLUCAP 102* 92 92 94    No results found for this or any previous visit (from the past 240 hour(s)).   Scheduled Meds: . ciprofloxacin  400 mg Intravenous Q12H  . gabapentin  600 mg Oral TID  . heparin  5,000 Units Subcutaneous 3 times per day  . insulin aspart  0-9 Units Subcutaneous 6 times per day  . levothyroxine  325 mcg Oral QAC breakfast  . metoCLOPramide  10 mg Oral QID  . metronidazole  500 mg Intravenous Q8H  . pantoprazole  40 mg Oral Daily   Continuous Infusions: . sodium chloride 75 mL/hr at 06/25/13 Peggyann Shoals, DO  [facility] Pager [phone]  If 7PM-7AM, please contact  night-coverage [email or web address] Password [email or web address] 06/26/2013, 8:45 AM   LOS: 1 day

## 2013-06-26 NOTE — Consult Note (Signed)
Referring Provider: Dr. Carles Collet Primary Care Physician:  [name], MD Primary Gastroenterologist:  UNASSIGNED  Reason for Consultation:  Abdominal pain  HPI: Adrienne Flores is a 48 y.o. female with 8 years of diffuse sharp abdominal pain that would worsen after eating. Frequent urgency postprandially with nonbloody diarrhea that would follow. Reports pain has been daily for 8 years except during an 8 month time frame several years ago. 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 She saw Dr. Marene Lenz in January 2014 at [place] and a colonoscopy that month was done. Report not found in Epic but biopsies from [facility] were negative for inflammation or microscopic colitis. She no showed for an appt to [facility] last month. She never followed up with [facility]. Chart history from [facility] of possible cyclical vomiting syndrome. She reports frequent N/V with eating on a chronic basis. She is tearful during my evaluation.  Past Medical History  Diagnosis Date  . Rheumatoid arthritis   . Gastroparesis   . Degenerative disc disease   . Fibromyalgia   . Diverticulitis   . Thyroid disease   . Lupus   . Porphyria     Past Surgical History  Procedure Laterality Date  . Cesarean section    . Replacement total knee Bilateral   . Abdominal hysterectomy    . Cholecystectomy      Prior to Admission medications   Medication Sig Start Date End Date Taking? Authorizing Provider  gabapentin (NEURONTIN) 600 MG tablet Take 600 mg by mouth 3 (three) times daily.   Yes Historical Provider, MD  levothyroxine (SYNTHROID, LEVOTHROID) 25 MCG tablet Take 25 mcg by mouth daily before breakfast. Take with 327mcg to = 346mcg total   Yes Historical Provider, MD  levothyroxine (SYNTHROID, LEVOTHROID) 300 MCG tablet Take 300 mcg by mouth daily before breakfast. Take with a 50mcg tablet to = 373mcg   Yes Historical Provider, MD  methocarbamol (ROBAXIN) 500 MG tablet Take 500 mg by  mouth 4 (four) times daily.   Yes Historical Provider, MD  metoCLOPramide (REGLAN) 10 MG tablet Take 1 tablet (10 mg total) by mouth 4 (four) times daily. 05/03/13  Yes Carlisle Cater, PA-C  naproxen sodium (ALEVE) 220 MG tablet Take 440 mg by mouth daily as needed (arthritis pain).   Yes Historical Provider, MD  omeprazole (PRILOSEC) 20 MG capsule Take 1 capsule (20 mg total) by mouth 2 (two) times daily before a meal. 03/19/13  Yes Lauren Burnetta Sabin, PA-C  ondansetron (ZOFRAN ODT) 4 MG disintegrating tablet Take 1 tablet (4 mg total) by mouth every 8 (eight) hours as needed for nausea or vomiting. 03/19/13  Yes Lauren Burnetta Sabin, PA-C  oxyCODONE (OXY IR/ROXICODONE) 5 MG immediate release tablet Take 5 mg by mouth every 4 (four) hours as needed for severe pain.   Yes Historical Provider, MD  promethazine (PHENERGAN) 25 MG suppository Place 25 mg rectally every 6 (six) hours as needed for nausea or vomiting.   Yes Historical Provider, MD  promethazine (PHENERGAN) 25 MG tablet Take 25 mg by mouth 2 (two) times daily as needed for nausea.    Yes Historical Provider, MD  tizanidine (ZANAFLEX) 2 MG capsule Take 2-4 mg by mouth 3 (three) times daily as needed for muscle spasms.  09/29/12  Yes Historical Provider, MD    Scheduled Meds: . ciprofloxacin  400 mg Intravenous Q12H  . gabapentin  600 mg Oral TID  . heparin  5,000 Units Subcutaneous 3 times per day  . insulin aspart  0-9 Units Subcutaneous 6 times per day  . levothyroxine  325 mcg Oral QAC breakfast  . metoCLOPramide  10 mg Oral QID  . metronidazole  500 mg Intravenous Q8H  . pantoprazole  40 mg Oral Daily   Continuous Infusions: . sodium chloride 75 mL/hr at 06/26/13 1126   PRN Meds:.ondansetron (ZOFRAN) IV, ondansetron, oxyCODONE, promethazine, sodium chloride  Allergies as of 06/25/2013 - Review Complete 06/25/2013  Allergen Reaction Noted  . Methotrexate derivatives Other (See Comments) 11/10/2012  . Morphine and related Hives and Itching  12/02/2012  . Relafen [nabumetone] Other (See Comments) 01/05/2013    Family History  Problem Relation Age of Onset  . Leukemia Mother     History   Social History  . Marital Status: Married    Spouse Name: N/A    Number of Children: N/A  . Years of Education: N/A   Occupational History  . Not on file.   Social History Main Topics  . Smoking status: Never Smoker   . Smokeless tobacco: Not on file  . Alcohol Use: Yes     Comment: social  . Drug Use: No  . Sexual Activity: Yes   Other Topics Concern  . Not on file   Social History Narrative  . No narrative on file    Review of Systems: All negative from GI standpoint except as stated above in HPI.  Physical Exam: Vital signs: Filed Vitals:   06/26/13 1233  BP: 107/75  Pulse: 86  Temp: 98 F (36.7 C)  Resp: 16   Last BM Date: 06/25/13 General:   Awake, tearful, morbidly obese HEENT: anicteric Lungs:  Clear throughout to auscultation.   No wheezes, crackles, or rhonchi. No acute distress. Heart:  Regular rate and rhythm; no murmurs, clicks, rubs,  or gallops. Abdomen: diffuse tenderness with guarding, soft, nondistended, +BS  Rectal:  Deferred Skin: red papules on lower abdomen Neuro: oriented  GI:  Lab Results:  Recent Labs  06/25/13 1342 06/25/13 2024 06/26/13 0620  WBC 7.5 7.4 5.5  HGB 13.2 11.4* 10.4*  HCT 39.2 33.7* 31.6*  PLT 209 217 184   BMET  Recent Labs  06/24/13 1645 06/25/13 1342 06/25/13 2024 06/26/13 0620  NA 141 139  --  142  K 3.3* 4.0  --  3.6*  CL 102 102  --  107  CO2 20 21  --  22  GLUCOSE 87 88  --  110*  BUN 17 13  --  10  CREATININE 1.40* 1.34* 1.37* 1.31*  CALCIUM 9.6 9.8  --  8.9   LFT  Recent Labs  06/26/13 0620  PROT 6.4  ALBUMIN 3.5  AST 19  ALT 24  ALKPHOS 76  BILITOT 0.4   PT/INR No results found for this basename: LABPROT, INR,  in the last 72 hours   Studies/Results: Ct Abdomen Pelvis Wo Contrast  06/25/2013   CLINICAL DATA:  Recurrent  episodes abdominal pain, vomiting and diarrhea for several years.  EXAM: CT ABDOMEN AND PELVIS WITHOUT CONTRAST  TECHNIQUE: Multidetector CT imaging of the abdomen and pelvis was performed following the standard protocol without IV contrast.  COMPARISON:  05/04/2013 CT.  FINDINGS: Portions of bowel are under distended and evaluation limited. Minimal haziness of fat planes surrounding the ascending colon appears slightly different than on prior examination. This may indicate minimal inflammation of the ascending colon. No inflammation surrounds the terminal ileum or appendix. No free  intraperitoneal air.  Age advanced atherosclerotic type changes with coronary artery calcification, calcification of the abdominal aorta and and portions of the iliac arteries. No abdominal aortic aneurysm.  Fatty infiltration the liver. Taking into account limitation by non contrast imaging, no worrisome hepatic, splenic, pancreatic, adrenal or renal lesion. Post cholecystectomy.  No adenopathy.  Lung bases clear.  Mild degenerative changes L5-S1.  Post hysterectomy.  Decompressed urinary bladder without gross abnormality.  IMPRESSION: Minimal haziness of fat planes surrounding the ascending colon appears slightly different than on prior examination. This may indicate minimal inflammation of the ascending colon.  Atherosclerotic type changes as detailed above.   Electronically Signed   By: Chauncey Cruel M.D.   On: 06/25/2013 15:51   US Abdomen Complete  06/25/2013   CLINICAL DATA:  Elevated liver function tests. Surgical history includes cholecystectomy.  EXAM: ULTRASOUND ABDOMEN COMPLETE  COMPARISON:  CT abdomen and pelvis earlier same date, 05/06/2013, 03/19/2013, 11/24/2012, 03/26/2011. No prior ultrasound.  FINDINGS: Gallbladder:  Surgically absent.  Common bile duct:  Diameter: Approximately 7 mm.  Liver:  Diffusely increased and coarsened echotexture without focal hepatic parenchymal abnormality. Patent portal vein with  hepatopetal flow.  IVC:  Patent.  Pancreas:  Suboptimally imaged due to midline bowel gas. The pancreas was atrophic and otherwise unremarkable on the CT earlier today.  Spleen:  Normal size and echotexture without focal parenchymal abnormality.  Right Kidney:  Length: Approximately 9.0 cm. No hydronephrosis. Well-preserved cortex. No shadowing calculi. Normal parenchymal echotexture without focal abnormalities.  Left Kidney:  Length: Approximately 10.4 cm. No hydronephrosis. Well-preserved cortex. No shadowing calculi. Normal parenchymal echotexture without focal abnormalities.  Abdominal aorta:  Normal in caliber throughout its visualized course in the abdomen with evidence of atherosclerosis. Maximum diameter 2.0 cm.  Other findings:  None.  IMPRESSION: 1. Diffuse hepatic steatosis and/or hepatocellular disease. No focal hepatic parenchymal abnormality. 2. No evidence of biliary ductal dilation post cholecystectomy. 3. Suboptimal imaging of the pancreas due to midline bowel gas. The pancreas was atrophic and otherwise unremarkable on the CT earlier today. 4. Aortic atherosclerosis without aneurysm, advanced for age, as noted on CT earlier today.   Electronically Signed   By: Evangeline Dakin M.D.   On: 06/25/2013 20:01    Impression/Plan: 48 yo with chronic abdominal pain/N/V/D with previous workup at [facility] in 2014 and negative colonoscopic biopsies (procedure report not seen) at that time. Doubt porphyria. If she has Lupus that could cause some of the GI symptoms that she describes. 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 I would NOT recommend another colonoscopy at this time and would hold off on an EGD if she can eat. Check TTG IgA for Celiac disease. Porphyria tests pending. Chronic diarrhea not consistent with infection but agree with checking stool studies while in hospital. If unable to take POs then would do EGD with Propofol sedation  early next week. Will sign off. Call us back if unable to take POs or if other questions arise. F/U should be with [facility].    LOS: 1 day   Lear Ng  06/26/2013, 1:09 PM

## 2013-06-26 NOTE — Progress Notes (Signed)
UR completed 

## 2013-06-27 DIAGNOSIS — N189 Chronic kidney disease, unspecified: Secondary | ICD-10-CM

## 2013-06-27 DIAGNOSIS — N179 Acute kidney failure, unspecified: Secondary | ICD-10-CM

## 2013-06-27 LAB — BASIC METABOLIC PANEL
BUN: 7 mg/dL (ref 6–23)
CO2: 21 mEq/L (ref 19–32)
CREATININE: 1.31 mg/dL — AB (ref 0.50–1.10)
Calcium: 8.7 mg/dL (ref 8.4–10.5)
Chloride: 106 mEq/L (ref 96–112)
GFR, EST AFRICAN AMERICAN: 54 mL/min — AB (ref >90)
GFR, EST NON AFRICAN AMERICAN: 47 mL/min — AB (ref >90)
Glucose, Bld: 80 mg/dL (ref 70–99)
Potassium: 3.2 mEq/L — ABNORMAL LOW (ref 3.7–5.3)
Sodium: 141 mEq/L (ref 137–147)

## 2013-06-27 LAB — CBC
HCT: 30.8 % — ABNORMAL LOW (ref 36.0–46.0)
HEMOGLOBIN: 10.3 g/dL — AB (ref 12.0–15.0)
MCH: 31.6 pg (ref 26.0–34.0)
MCHC: 33.4 g/dL (ref 30.0–36.0)
MCV: 94.5 fL (ref 78.0–100.0)
Platelets: 178 10*3/uL (ref 150–400)
RBC: 3.26 MIL/uL — ABNORMAL LOW (ref 3.87–5.11)
RDW: 14.8 % (ref 11.5–15.5)
WBC: 5 10*3/uL (ref 4.0–10.5)

## 2013-06-27 LAB — GLUCOSE, CAPILLARY
GLUCOSE-CAPILLARY: 80 mg/dL (ref 70–99)
GLUCOSE-CAPILLARY: 63 mg/dL — AB (ref 70–99)
GLUCOSE-CAPILLARY: 98 mg/dL (ref 70–99)
Glucose-Capillary: 90 mg/dL (ref 70–99)

## 2013-06-27 MED ORDER — DIPHENHYDRAMINE HCL 25 MG PO CAPS
25.0000 mg | ORAL_CAPSULE | Freq: Once | ORAL | Status: AC
  Administered 2013-06-27: 25 mg via ORAL
  Filled 2013-06-27: qty 1

## 2013-06-27 MED ORDER — POTASSIUM CHLORIDE CRYS ER 20 MEQ PO TBCR
40.0000 meq | EXTENDED_RELEASE_TABLET | Freq: Once | ORAL | Status: AC
  Administered 2013-06-27: 40 meq via ORAL
  Filled 2013-06-27: qty 2

## 2013-06-27 MED ORDER — DIPHENHYDRAMINE HCL 25 MG PO CAPS
25.0000 mg | ORAL_CAPSULE | Freq: Four times a day (QID) | ORAL | Status: DC | PRN
  Administered 2013-06-27 – 2013-06-28 (×2): 25 mg via ORAL
  Filled 2013-06-27 (×2): qty 1

## 2013-06-27 MED ORDER — OXYCODONE HCL 5 MG PO TABS
5.0000 mg | ORAL_TABLET | Freq: Once | ORAL | Status: AC
  Administered 2013-06-27: 5 mg via ORAL

## 2013-06-27 NOTE — Progress Notes (Signed)
Hypoglycemic Event  CBG: 63  Treatment: 15 GM carbohydrate snack  Symptoms: None  Follow-up CBG: Time:1319 CBG Result:90  Possible Reasons for Event: Inadequate meal intake  Comments/MD notified: Dr. Bella Kennedy  Remember to initiate Hypoglycemia Order Set & complete

## 2013-06-27 NOTE — Discharge Summary (Addendum)
Physician Discharge Summary  CHISA KUSHNER WNI:[id number] DOB: 1965/06/13 DOA: 06/25/2013  PCP: [name], MD  Admit date: 06/25/2013 Discharge date: 06/28/13 Recommendations for Outpatient Follow-up:  1. Pt will need to follow up with PCP in 2 weeks post discharge 2. Please obtain BMP to evaluate electrolytes and kidney function 3. Please also check CBC to evaluate Hg and Hct levels 4. Follow up with Dr. 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 consult--the patient had functional bowel disease with IBS as the main source of her GI symptoms. He did not recommend any further workup at this time. He ordered transglutaminase antibodies which are pending at the time of discharge -Discussed with the patient to try to minimize opioids  -Continue empiric antibiotics--the patient will be discharged with ciprofloxacin and Flagyl for 7 additional days which will complete 10 days of therapy -pt was instructed NOT to take Zanaflex until she is finished with all ciprofloxacin pills -Previous workup for porphyria (24hr urine porphyrobilinogen and urine porphyrins)  including acute intermittent porphyria has been unremarkable as noted in October 2014  -Obtain stool for C. difficile PCR--no diarrhea since admission; therefore no stool was obtained   -Stool pathogen panel--no diarrhea since admission; therefore no stool was obtained -Urinalysis negative for pyuria  -Jan 2014 colonoscopy at [facility] unremarkable  -The patient's diet was advanced which she tolerated    Hypothyroidism  -Suspect poor compliance with levothyroxine as patient states that she frequently misses her doses when she feels "bad"  -Continue levothyroxine by mouth for now if patient is able to tolerate by mouth  Acute on chronic CKD stage III  -Baseline creatinine 1.0-1.2  -Continue IV fluids  -Serum creatinine improved -1.28 on day of d/c Uncontrolled pain  -I discussed with the patient that it is unrealistic to make her pain 0  -Continue oral opioids  -IV opioids for severe uncontrolled pain when necessary  -patient had IV opioid seeking behavior. when distracted, the patient's pain was well-controlled    Hyperlipidemia  -LDL 201  -pt would benefit from statin-pt will f/u with PCP for followup Hypokalemia  -Replete  -Check Mag--2.0  Disposition Plan: Home when medically stable  Antibiotics:  Ciprofloxacin 06/25/2013>>>  Metronidazole 06/25/2013>>>   Discharge Condition:  Stable  Disposition:  Follow-up Information   Follow up with Wonda Horner, MD. (06/29/13)    Specialty:  Gastroenterology   Contact information:   [address] [place] [address] [phone]      home   Diet: Cardiac/heart healthy  Wt Readings from Last 3 Encounters:  06/27/13 84.868 kg (187 lb 1.6 oz)  06/01/13 83.915 kg (185 lb)  05/02/13 84.369 kg (186 lb)    History of present illness:  48 year old female with a history of chronic abdominal pain for the past 7 years admitted on 06/25/2013 with worsening abdominal pain, vomiting, and diarrhea. The patient states that in the past week she has had 3-4 loose bowel movements on a daily basis without any hematochezia or melena. She denies any hematemesis. She has had some subjective chills. Patient also  states that she has been taking NSAIDs including Aleve, ibuprofen, and BC powders at home. CT of the abdomen and pelvis in the emergency department revealed some haziness in the fat planes of the ascending colon suggestive of  possible inflammation. The patient was previously worked up for porphyria, but urine porphyrins on 11/26/2012 were unremarkable and a 24-hour urine for porphobilinogen was negative. She never followed up with her hematologist. She states that she has an appointment on 06/29/2013 with [facility]. Hepatic panel, lipase, CBC and electrolytes were unremarkable in the emergency department. The patient was admitted due to uncontrolled pain and renal failure. The patient was treated with oral opioids, but she had IV opioid seeking behavior. The patient's was well-controlled on distracted. Patient was initially n.p.o. Her diet was advanced which she tolerated without any vomiting. Although the patient claimed to have diarrhea prior to admission, the patient did not have any bowel movements during the hospitalization. Therefore the stool studies were not obtained. Gastroenterology was consulted. They felt that the main source of the patient's symptoms was related to functional bowel disease and IBS. The patient has a followup appointment with [facility] gastroenterology on 06/29/2013.     Consultants: [facility]--[name]  Discharge Exam: Filed Vitals:   06/28/13 0432  BP: 99/59  Pulse: 81  Temp: 98.6 F (37 C)  Resp: 17   Filed Vitals:   06/27/13 0900 06/27/13 1700 06/27/13 2205 06/28/13 0432  BP: 105/75 119/89 101/66 99/59  Pulse: 77 92 88 81  Temp: 98.2 F (36.8 C) 98.4 F (36.9 C) 98.4 F (36.9 C) 98.6 F (37 C)  TempSrc: Oral Oral Oral Oral  Resp: 21 23 19 17   Height:      Weight:   84.868 kg (187 lb 1.6 oz)   SpO2: 100% 100% 97% 99%   General: A&O x 3, NAD, pleasant, cooperative Cardiovascular: RRR, no rub, no gallop, no S3 Respiratory: CTAB, no wheeze, no rhonchi Abdomen:soft, nontender, nondistended, positive bowel sounds Extremities: No edema, No lymphangitis, no petechiae  Discharge Instructions      Discharge Instructions   Diet - low sodium heart  healthy    Complete by:  As directed      Increase activity slowly    Complete by:  As directed             Medication List         ALEVE 220 MG tablet  Generic drug:  naproxen sodium  Take 440 mg by mouth daily as needed (arthritis pain).     ciprofloxacin 500 MG tablet  Commonly known as:  CIPRO  Take 1 tablet (500 mg total) by mouth 2 (two) times daily.     gabapentin 600 MG tablet  Commonly known as:  NEURONTIN  Take 600 mg by mouth 3 (three) times daily.     levothyroxine 300 MCG tablet  Commonly known as:  SYNTHROID, LEVOTHROID  Take 300 mcg by mouth daily before breakfast. Take with a 76mcg tablet to = 380mcg     levothyroxine 25 MCG tablet  Commonly known as:  SYNTHROID, LEVOTHROID  Take 25 mcg by mouth daily before breakfast. Take with 379mcg to = 311mcg total     methocarbamol 500 MG tablet  Commonly known as:  ROBAXIN  Take 500 mg by mouth 4 (four) times daily.     metoCLOPramide 10 MG tablet  Commonly known as:  REGLAN  Take 1 tablet (10 mg total) by mouth 4 (four) times daily.  metroNIDAZOLE 500 MG tablet  Commonly known as:  FLAGYL  Take 1 tablet (500 mg total) by mouth every 8 (eight) hours.     omeprazole 20 MG capsule  Commonly known as:  PRILOSEC  Take 1 capsule (20 mg total) by mouth 2 (two) times daily before a meal.     ondansetron 4 MG disintegrating tablet  Commonly known as:  ZOFRAN ODT  Take 1 tablet (4 mg total) by mouth every 8 (eight) hours as needed for nausea or vomiting.     oxyCODONE 5 MG immediate release tablet  Commonly known as:  Oxy IR/ROXICODONE  Take 5 mg by mouth every 4 (four) hours as needed for severe pain.     promethazine 25 MG suppository  Commonly known as:  PHENERGAN  Place 25 mg rectally every 6 (six) hours as needed for nausea or vomiting.     promethazine 25 MG tablet  Commonly known as:  PHENERGAN  Take 25 mg by mouth 2 (two) times daily as needed for nausea.     tizanidine 2 MG capsule  Commonly  known as:  ZANAFLEX  Take 2-4 mg by mouth 3 (three) times daily as needed for muscle spasms.         The results of significant diagnostics from this hospitalization (including imaging, microbiology, ancillary and laboratory) are listed below for reference.    Significant Diagnostic Studies: Ct Abdomen Pelvis Wo Contrast  06/25/2013   CLINICAL DATA:  Recurrent episodes abdominal pain, vomiting and diarrhea for several years.  EXAM: CT ABDOMEN AND PELVIS WITHOUT CONTRAST  TECHNIQUE: Multidetector CT imaging of the abdomen and pelvis was performed following the standard protocol without IV contrast.  COMPARISON:  05/04/2013 CT.  FINDINGS: Portions of bowel are under distended and evaluation limited. Minimal haziness of fat planes surrounding the ascending colon appears slightly different than on prior examination. This may indicate minimal inflammation of the ascending colon. No inflammation surrounds the terminal ileum or appendix. No free intraperitoneal air.  Age advanced atherosclerotic type changes with coronary artery calcification, calcification of the abdominal aorta and and portions of the iliac arteries. No abdominal aortic aneurysm.  Fatty infiltration the liver. Taking into account limitation by non contrast imaging, no worrisome hepatic, splenic, pancreatic, adrenal or renal lesion. Post cholecystectomy.  No adenopathy.  Lung bases clear.  Mild degenerative changes L5-S1.  Post hysterectomy.  Decompressed urinary bladder without gross abnormality.  IMPRESSION: Minimal haziness of fat planes surrounding the ascending colon appears slightly different than on prior examination. This may indicate minimal inflammation of the ascending colon.  Atherosclerotic type changes as detailed above.   Electronically Signed   By: Chauncey Cruel M.D.   On: 06/25/2013 15:51   US Abdomen Complete  06/25/2013   CLINICAL DATA:  Elevated liver function tests. Surgical history includes cholecystectomy.  EXAM:  ULTRASOUND ABDOMEN COMPLETE  COMPARISON:  CT abdomen and pelvis earlier same date, 05/06/2013, 03/19/2013, 11/24/2012, 03/26/2011. No prior ultrasound.  FINDINGS: Gallbladder:  Surgically absent.  Common bile duct:  Diameter: Approximately 7 mm.  Liver:  Diffusely increased and coarsened echotexture without focal hepatic parenchymal abnormality. Patent portal vein with hepatopetal flow.  IVC:  Patent.  Pancreas:  Suboptimally imaged due to midline bowel gas. The pancreas was atrophic and otherwise unremarkable on the CT earlier today.  Spleen:  Normal size and echotexture without focal parenchymal abnormality.  Right Kidney:  Length: Approximately 9.0 cm. No hydronephrosis. Well-preserved cortex. No shadowing calculi. Normal parenchymal echotexture without focal abnormalities.  Left Kidney:  Length: Approximately 10.4 cm. No hydronephrosis. Well-preserved cortex. No shadowing calculi. Normal parenchymal echotexture without focal abnormalities.  Abdominal aorta:  Normal in caliber throughout its visualized course in the abdomen with evidence of atherosclerosis. Maximum diameter 2.0 cm.  Other findings:  None.  IMPRESSION: 1. Diffuse hepatic steatosis and/or hepatocellular disease. No focal hepatic parenchymal abnormality. 2. No evidence of biliary ductal dilation post cholecystectomy. 3. Suboptimal imaging of the pancreas due to midline bowel gas. The pancreas was atrophic and otherwise unremarkable on the CT earlier today. 4. Aortic atherosclerosis without aneurysm, advanced for age, as noted on CT earlier today.   Electronically Signed   By: Evangeline Dakin M.D.   On: 06/25/2013 20:01   Dg Abd Acute W/chest  06/17/2013   CLINICAL DATA:  Pain.  EXAM: ACUTE ABDOMEN SERIES (ABDOMEN 2 VIEW & CHEST 1 VIEW)  COMPARISON:  CT ABD-PELV W/ CM dated 05/04/2013  FINDINGS: Chest x-ray reveals no acute cardiopulmonary disease. Port-A-Cath noted with tip projected over superior vena cava.  Soft tissues abdomen are unremarkable.  Surgical clips in right upper quadrant. Surgical clip left lower pelvis. The gas pattern is nonspecific. No free air. No acute bony abnormality.  IMPRESSION: Negative abdominal radiographs.  No acute cardiopulmonary disease.   Electronically Signed   By: Marcello Moores  Register   On: 06/17/2013 08:46     Microbiology: No results found for this or any previous visit (from the past 240 hour(s)).   Labs: Basic Metabolic Panel:  Recent Labs Lab 06/24/13 1645 06/25/13 1342 06/25/13 2024 06/26/13 0620 06/27/13 0425 06/28/13 0414  NA 141 139  --  142 141 140  K 3.3* 4.0  --  3.6* 3.2* 3.3*  CL 102 102  --  107 106 106  CO2 20 21  --  22 21 21   GLUCOSE 87 88  --  110* 80 87  BUN 17 13  --  10 7 6   CREATININE 1.40* 1.34* 1.37* 1.31* 1.31* 1.28*  CALCIUM 9.6 9.8  --  8.9 8.7 9.0  MG  --   --   --   --   --  2.0   Liver Function Tests:  Recent Labs Lab 06/21/13 1723 06/24/13 1645 06/25/13 1342 06/26/13 0620  AST 37 40* 33 19  ALT 38* 45* 38* 24  ALKPHOS 93 103 104 76  BILITOT 0.6 0.8 0.6 0.4  PROT 8.0 8.1 8.4* 6.4  ALBUMIN 4.3 4.5 4.5 3.5    Recent Labs Lab 06/21/13 1723 06/24/13 1645 06/25/13 1342  LIPASE 10* 15 22   No results found for this basename: AMMONIA,  in the last 168 hours CBC:  Recent Labs Lab 06/24/13 1645 06/25/13 1342 06/25/13 2024 06/26/13 0620 06/27/13 0425  WBC 8.2 7.5 7.4 5.5 5.0  NEUTROABS 5.9 4.7  --   --   --   HGB 12.6 13.2 11.4* 10.4* 10.3*  HCT 37.0 39.2 33.7* 31.6* 30.8*  MCV 93.0 94.7 95.2 95.2 94.5  PLT 226 209 217 184 178   Cardiac Enzymes:  Recent Labs Lab 06/25/13 2024  CKTOTAL 191*   BNP: No components found with this basename: POCBNP,  CBG:  Recent Labs Lab 06/27/13 1150 06/27/13 1317 06/27/13 1707 06/27/13 2156 06/28/13 0825  GLUCAP 63* 90 98 80 91    Time coordinating discharge:  Greater than 30 minutes  Signed:  Orson Eva, DO [facility] Pager: [phone] 06/28/2013, 10:11 AM

## 2013-06-27 NOTE — Progress Notes (Addendum)
PROGRESS NOTE  Adrienne Flores RJJ:[id number] DOB: April 21, 1965 DOA: 06/25/2013 PCP: [name], MD  Assessment/Plan: Abdominal pain with diarrhea  -CT of abdomen and pelvis suggested possible inflammation of the ascending colon, but this may be due to under distention of the bowel due contrast  -The patient has chronic abdominal pain is on chronic opioids with oxycodone at home  -Unclear if the patient's hypothyroidism may be playing a role in her abdominal pain  -Celiac disease has been associated was severe hypothyroidism  -Appreciate [name] consult -Discussed with the patient to try to minimize opioids  -Continue empiric antibiotics -Previous workup for porphyria including acute intermittent porphyria has been unremarkable as noted in October 2014  -Obtain stool for C. difficile PCR--no diarrhea since admission  -Stool pathogen panel--no diarrhea since admission -Urinalysis negative for pyuria  -Jan 2014 colonoscopy at [facility] unremarkable Hypothyroidism  -Suspect poor compliance with levothyroxine as patient states that she frequently misses her doses when she feels "bad"  -Continue levothyroxine by mouth for now if patient is able to tolerate by mouth  Acute on chronic CKD stage III  -Baseline creatinine 1.0-1.2  -Continue IV fluids  Uncontrolled pain  -I discussed with the patient that it is unrealistic to make her pain 0  -Continue oral opioids  -IV opioids for severe uncontrolled pain when necessary   Hyperlipidemia  -LDL 201  -Start statin once the patient is able to tolerate by mouth  Hypokalemia -Replete -Check Mag Family Communication: Pt at beside  Disposition Plan: Home when medically stable  Antibiotics:  Ciprofloxacin 06/25/2013>>>  Metronidazole 06/25/2013>>>       Procedures/Studies: Ct Abdomen Pelvis Wo Contrast  06/25/2013   CLINICAL DATA:  Recurrent episodes abdominal pain, vomiting and diarrhea for several years.  EXAM: CT  ABDOMEN AND PELVIS WITHOUT CONTRAST  TECHNIQUE: Multidetector CT imaging of the abdomen and pelvis was performed following the standard protocol without IV contrast.  COMPARISON:  05/04/2013 CT.  FINDINGS: Portions of bowel are under distended and evaluation limited. Minimal haziness of fat planes surrounding the ascending colon appears slightly different than on prior examination. This may indicate minimal inflammation of the ascending colon. No inflammation surrounds the terminal ileum or appendix. No free intraperitoneal air.  Age advanced atherosclerotic type changes with coronary artery calcification, calcification of the abdominal aorta and and portions of the iliac arteries. No abdominal aortic aneurysm.  Fatty infiltration the liver. Taking into account limitation by non contrast imaging, no worrisome hepatic, splenic, pancreatic, adrenal or renal lesion. Post cholecystectomy.  No adenopathy.  Lung bases clear.  Mild degenerative changes L5-S1.  Post hysterectomy.  Decompressed urinary bladder without gross abnormality.  IMPRESSION: Minimal haziness of fat planes surrounding the ascending colon appears slightly different than on prior examination. This may indicate minimal inflammation of the ascending colon.  Atherosclerotic type changes as detailed above.   Electronically Signed   By: Chauncey Cruel M.D.   On: 06/25/2013 15:51   US Abdomen Complete  06/25/2013   CLINICAL DATA:  Elevated liver function tests. Surgical history includes cholecystectomy.  EXAM: ULTRASOUND ABDOMEN COMPLETE  COMPARISON:  CT abdomen and pelvis earlier same date, 05/06/2013, 03/19/2013, 11/24/2012, 03/26/2011. No prior ultrasound.  FINDINGS: Gallbladder:  Surgically absent.  Common bile duct:  Diameter: Approximately 7 mm.  Liver:  Diffusely increased and coarsened echotexture without focal hepatic parenchymal abnormality. Patent portal vein with hepatopetal flow.  IVC:  Patent.  Pancreas:  Suboptimally imaged due to  midline  bowel gas. The pancreas was atrophic and otherwise unremarkable on the CT earlier today.  Spleen:  Normal size and echotexture without focal parenchymal abnormality.  Right Kidney:  Length: Approximately 9.0 cm. No hydronephrosis. Well-preserved cortex. No shadowing calculi. Normal parenchymal echotexture without focal abnormalities.  Left Kidney:  Length: Approximately 10.4 cm. No hydronephrosis. Well-preserved cortex. No shadowing calculi. Normal parenchymal echotexture without focal abnormalities.  Abdominal aorta:  Normal in caliber throughout its visualized course in the abdomen with evidence of atherosclerosis. Maximum diameter 2.0 cm.  Other findings:  None.  IMPRESSION: 1. Diffuse hepatic steatosis and/or hepatocellular disease. No focal hepatic parenchymal abnormality. 2. No evidence of biliary ductal dilation post cholecystectomy. 3. Suboptimal imaging of the pancreas due to midline bowel gas. The pancreas was atrophic and otherwise unremarkable on the CT earlier today. 4. Aortic atherosclerosis without aneurysm, advanced for age, as noted on CT earlier today.   Electronically Signed   By: Evangeline Dakin M.D.   On: 06/25/2013 20:01   Dg Abd Acute W/chest  06/17/2013   CLINICAL DATA:  Pain.  EXAM: ACUTE ABDOMEN SERIES (ABDOMEN 2 VIEW & CHEST 1 VIEW)  COMPARISON:  CT ABD-PELV W/ CM dated 05/04/2013  FINDINGS: Chest x-ray reveals no acute cardiopulmonary disease. Port-A-Cath noted with tip projected over superior vena cava.  Soft tissues abdomen are unremarkable. Surgical clips in right upper quadrant. Surgical clip left lower pelvis. The gas pattern is nonspecific. No free air. No acute bony abnormality.  IMPRESSION: Negative abdominal radiographs.  No acute cardiopulmonary disease.   Electronically Signed   By: Marcello Moores  Register   On: 06/17/2013 08:46         Subjective: Patient states the abdominal pain is somewhat improved. She is able to tolerate a soft diet. Denies any fevers, chills, chest  pain, shortness breath, vomiting, diarrhea. She has not had a bowel movement since admission.   Objective: Filed Vitals:   06/26/13 1233 06/26/13 2057 06/27/13 0430 06/27/13 0900  BP: 107/75 113/79 93/60 105/75  Pulse: 86 84 86 77  Temp: 98 F (36.7 C) 98.2 F (36.8 C) 98 F (36.7 C) 98.2 F (36.8 C)  TempSrc: Oral Oral Oral Oral  Resp: 16 18 18 21   Height:      Weight:  85.412 kg (188 lb 4.8 oz)    SpO2: 97% 93% 96% 100%    Intake/Output Summary (Last 24 hours) at 06/27/13 1509 Last data filed at 06/27/13 1300  Gross per 24 hour  Intake   3100 ml  Output    400 ml  Net   2700 ml   Weight change: 1.497 kg (3 lb 4.8 oz) Exam:   General:  Pt is alert, follows commands appropriately, not in acute distress  HEENT: No icterus, No thrush,  [place]/AT  Cardiovascular: RRR, S1/S2, no rubs, no gallops  Respiratory: CTA bilaterally, no wheezing, no crackles, no rhonchi  Abdomen: Soft/+BS, non tender when distracted, non distended, no guarding  Extremities: No edema, No lymphangitis, No petechiae, No rashes, no synovitis  Data Reviewed: Basic Metabolic Panel:  Recent Labs Lab 06/21/13 1723 06/24/13 1645 06/25/13 1342 06/25/13 2024 06/26/13 0620 06/27/13 0425  NA 140 141 139  --  142 141  K 3.7 3.3* 4.0  --  3.6* 3.2*  CL 100 102 102  --  107 106  CO2 21 20 21   --  22 21  GLUCOSE 87 87 88  --  110* 80  BUN 15 17 13   --  10  7  CREATININE 1.17* 1.40* 1.34* 1.37* 1.31* 1.31*  CALCIUM 9.4 9.6 9.8  --  8.9 8.7   Liver Function Tests:  Recent Labs Lab 06/21/13 1723 06/24/13 1645 06/25/13 1342 06/26/13 0620  AST 37 40* 33 19  ALT 38* 45* 38* 24  ALKPHOS 93 103 104 76  BILITOT 0.6 0.8 0.6 0.4  PROT 8.0 8.1 8.4* 6.4  ALBUMIN 4.3 4.5 4.5 3.5    Recent Labs Lab 06/21/13 1723 06/24/13 1645 06/25/13 1342  LIPASE 10* 15 22   No results found for this basename: AMMONIA,  in the last 168 hours CBC:  Recent Labs Lab 06/24/13 1645 06/25/13 1342 06/25/13 2024  06/26/13 0620 06/27/13 0425  WBC 8.2 7.5 7.4 5.5 5.0  NEUTROABS 5.9 4.7  --   --   --   HGB 12.6 13.2 11.4* 10.4* 10.3*  HCT 37.0 39.2 33.7* 31.6* 30.8*  MCV 93.0 94.7 95.2 95.2 94.5  PLT 226 209 217 184 178   Cardiac Enzymes:  Recent Labs Lab 06/25/13 2024  CKTOTAL 191*   BNP: No components found with this basename: POCBNP,  CBG:  Recent Labs Lab 06/26/13 1716 06/26/13 2048 06/27/13 0818 06/27/13 1150 06/27/13 1317  GLUCAP 89 80 80 63* 90    No results found for this or any previous visit (from the past 240 hour(s)).   Scheduled Meds: . ciprofloxacin  400 mg Intravenous Q12H  . gabapentin  600 mg Oral TID  . heparin  5,000 Units Subcutaneous 3 times per day  . insulin aspart  0-9 Units Subcutaneous TID WC  . levothyroxine  325 mcg Oral QAC breakfast  . metoCLOPramide  10 mg Oral QID  . metronidazole  500 mg Intravenous Q8H  . pantoprazole  40 mg Oral Daily   Continuous Infusions: . sodium chloride 75 mL/hr at 06/26/13 2345     Orson Eva, DO  [facility] Pager [phone]  If 7PM-7AM, please contact night-coverage [email or web address] Password [email or web address] 06/27/2013, 3:09 PM   LOS: 2 days

## 2013-06-28 LAB — MAGNESIUM: Magnesium: 2 mg/dL (ref 1.5–2.5)

## 2013-06-28 LAB — GLUCOSE, CAPILLARY
Glucose-Capillary: 80 mg/dL (ref 70–99)
Glucose-Capillary: 91 mg/dL (ref 70–99)

## 2013-06-28 LAB — BASIC METABOLIC PANEL
BUN: 6 mg/dL (ref 6–23)
CO2: 21 mEq/L (ref 19–32)
Calcium: 9 mg/dL (ref 8.4–10.5)
Chloride: 106 mEq/L (ref 96–112)
Creatinine, Ser: 1.28 mg/dL — ABNORMAL HIGH (ref 0.50–1.10)
GFR, EST AFRICAN AMERICAN: 56 mL/min — AB (ref >90)
GFR, EST NON AFRICAN AMERICAN: 48 mL/min — AB (ref >90)
GLUCOSE: 87 mg/dL (ref 70–99)
Potassium: 3.3 mEq/L — ABNORMAL LOW (ref 3.7–5.3)
SODIUM: 140 meq/L (ref 137–147)

## 2013-06-28 MED ORDER — METRONIDAZOLE 500 MG PO TABS: 500.0000 mg | ORAL_TABLET | Freq: Three times a day (TID) | ORAL | Status: DC

## 2013-06-28 MED ORDER — CIPROFLOXACIN HCL 500 MG PO TABS
500.0000 mg | ORAL_TABLET | Freq: Two times a day (BID) | ORAL | Status: DC
  Filled 2013-06-28 (×2): qty 1

## 2013-06-28 MED ORDER — POTASSIUM CHLORIDE CRYS ER 20 MEQ PO TBCR
30.0000 meq | EXTENDED_RELEASE_TABLET | Freq: Once | ORAL | Status: AC
  Administered 2013-06-28: 30 meq via ORAL
  Filled 2013-06-28: qty 1

## 2013-06-28 MED ORDER — CIPROFLOXACIN HCL 500 MG PO TABS: 500.0000 mg | ORAL_TABLET | Freq: Two times a day (BID) | ORAL | Status: DC

## 2013-06-28 MED ORDER — HEPARIN SOD (PORK) LOCK FLUSH 100 UNIT/ML IV SOLN
500.0000 [IU] | INTRAVENOUS | Status: AC | PRN
  Administered 2013-06-28: 500 [IU]

## 2013-06-28 MED ORDER — METRONIDAZOLE 500 MG PO TABS
500.0000 mg | ORAL_TABLET | Freq: Three times a day (TID) | ORAL | Status: DC
  Filled 2013-06-28 (×2): qty 1

## 2013-06-28 NOTE — Discharge Instructions (Signed)
Do NOT take Zanaflex (tizanidine) for 8 days.  Do NOT take Zanaflex until you are finished with Ciprofloxacin

## 2013-06-28 NOTE — Progress Notes (Signed)
  Patient Discharge:  Disposition: home   Education: Educated patient on discharge instructions, follow-up medications, and follow-up appointment.  Patient instructed not to resume taking Zanaflex until after the course of Cipro is completed.  Patient verbalized understanding.  AVS signed.   IV: Right chest port heparin locked and deaccessed by IV team  Telemetry: n/a   Follow-up appointments: patient instructed on how to make follow-up appointment and given number.  Patient verbalized understanding.   Prescriptions:  Electronically sent to [facility]:  Ambulated to ED with husband  Belongings:

## 2013-06-29 LAB — TISSUE TRANSGLUTAMINASE, IGA: TISSUE TRANSGLUTAMINASE AB, IGA: 5.1 U/mL (ref <20)

## 2013-06-29 LAB — PORPHOBILINOGEN, RANDOM URINE: Quantitative Porphobilinogen: 0 mg/g creat (ref <2.0)

## 2013-06-29 NOTE — ED Provider Notes (Signed)
Medical screening examination/treatment/procedure(s) were performed by non-physician practitioner and as supervising physician I was immediately available for consultation/collaboration.  Leota Jacobsen, MD 06/29/13 [phone]

## 2013-06-30 LAB — IGA: IgA: 391 mg/dL — ABNORMAL HIGH (ref 69–380)

## 2013-07-01 ENCOUNTER — Emergency Department: Payer: Self-pay | Admitting: Emergency Medicine

## 2013-07-02 ENCOUNTER — Ambulatory Visit: Payer: Self-pay | Admitting: Pain Medicine

## 2013-07-02 NOTE — ED Provider Notes (Signed)
I saw and evaluated the patient, reviewed the resident's note and I agree with the findings and plan.   EKG Interpretation None      Please see my additional dictation   Tanna Furry, MD 07/02/13 [phone]

## 2013-07-03 LAB — AMINOLEVULINIC ACID, 24 HOUR
Creatinine, Urine mg/day-ALA: 0.891 g/(24.h) (ref 0.800–2.000)
Delta Ala,  24H Ur: 0.11 mg/dL (ref 0.03–0.54)
Total Volume - ALA: 1350

## 2013-07-08 LAB — PORPHYRINS, FRACTIONATED URINE (TIMED COLLECTION)
COPROPORPHYRIN, U: 8.1 ug/(24.h) (ref 7.1–48.7)
Coproporphyrin III: 9.9 mcg/24 h — ABNORMAL LOW (ref 11.0–148.5)
HEPTACARBOXYL PORPHYRIN 24H UR: 2 ug/(24.h) (ref <=3.3)
Porphyrins, total: 34.7 mcg/24 h — ABNORMAL LOW (ref 35.0–210.7)
Uroporphyrin III (PORPF): 2.4 mcg/24 h (ref 0.7–7.4)
Uroporphyrin, Urine: 12.3 mcg/24 h (ref 4.1–22.4)
VOLUME, URINE-PORPF: 1350 mL

## 2013-07-22 DIAGNOSIS — E079 Disorder of thyroid, unspecified: Secondary | ICD-10-CM | POA: Insufficient documentation

## 2013-07-22 DIAGNOSIS — N390 Urinary tract infection, site not specified: Secondary | ICD-10-CM | POA: Insufficient documentation

## 2013-07-22 DIAGNOSIS — Z9889 Other specified postprocedural states: Secondary | ICD-10-CM | POA: Insufficient documentation

## 2013-07-22 DIAGNOSIS — Z9089 Acquired absence of other organs: Secondary | ICD-10-CM | POA: Insufficient documentation

## 2013-07-22 DIAGNOSIS — Z79899 Other long term (current) drug therapy: Secondary | ICD-10-CM | POA: Insufficient documentation

## 2013-07-22 DIAGNOSIS — Z8739 Personal history of other diseases of the musculoskeletal system and connective tissue: Secondary | ICD-10-CM | POA: Insufficient documentation

## 2013-07-22 DIAGNOSIS — Z90710 Acquired absence of both cervix and uterus: Secondary | ICD-10-CM | POA: Insufficient documentation

## 2013-07-23 ENCOUNTER — Encounter (HOSPITAL_COMMUNITY): Payer: Self-pay | Admitting: Emergency Medicine

## 2013-07-23 ENCOUNTER — Emergency Department (HOSPITAL_COMMUNITY)
Admission: EM | Admit: 2013-07-23 | Discharge: 2013-07-23 | Disposition: A | Discharge status: Home / Self Care | Payer: Medicare Other | Attending: Emergency Medicine | Admitting: Emergency Medicine

## 2013-07-23 DIAGNOSIS — N390 Urinary tract infection, site not specified: Secondary | ICD-10-CM

## 2013-07-23 DIAGNOSIS — R109 Unspecified abdominal pain: Secondary | ICD-10-CM

## 2013-07-23 LAB — URINALYSIS, ROUTINE W REFLEX MICROSCOPIC
Bilirubin Urine: NEGATIVE
Glucose, UA: NEGATIVE mg/dL
HGB URINE DIPSTICK: NEGATIVE
Ketones, ur: NEGATIVE mg/dL
NITRITE: NEGATIVE
Protein, ur: NEGATIVE mg/dL
SPECIFIC GRAVITY, URINE: 1.014 (ref 1.005–1.030)
UROBILINOGEN UA: 1 mg/dL (ref 0.0–1.0)
pH: 6.5 (ref 5.0–8.0)

## 2013-07-23 LAB — COMPREHENSIVE METABOLIC PANEL
ALBUMIN: 4.6 g/dL (ref 3.5–5.2)
ALT: 23 U/L (ref 0–35)
AST: 23 U/L (ref 0–37)
Alkaline Phosphatase: 129 U/L — ABNORMAL HIGH (ref 39–117)
BUN: 15 mg/dL (ref 6–23)
CALCIUM: 10.1 mg/dL (ref 8.4–10.5)
CO2: 24 mEq/L (ref 19–32)
CREATININE: 1.19 mg/dL — AB (ref 0.50–1.10)
Chloride: 95 mEq/L — ABNORMAL LOW (ref 96–112)
GFR calc Af Amer: 61 mL/min — ABNORMAL LOW (ref >90)
GFR calc non Af Amer: 52 mL/min — ABNORMAL LOW (ref >90)
GLUCOSE: 104 mg/dL — AB (ref 70–99)
Potassium: 4.1 mEq/L (ref 3.7–5.3)
Sodium: 138 mEq/L (ref 137–147)
TOTAL PROTEIN: 8.9 g/dL — AB (ref 6.0–8.3)
Total Bilirubin: 0.6 mg/dL (ref 0.3–1.2)

## 2013-07-23 LAB — CBC WITH DIFFERENTIAL/PLATELET
BASOS PCT: 0 % (ref 0–1)
Basophils Absolute: 0 10*3/uL (ref 0.0–0.1)
EOS ABS: 0.1 10*3/uL (ref 0.0–0.7)
EOS PCT: 2 % (ref 0–5)
HCT: 38.9 % (ref 36.0–46.0)
HEMOGLOBIN: 12.7 g/dL (ref 12.0–15.0)
LYMPHS ABS: 1.3 10*3/uL (ref 0.7–4.0)
Lymphocytes Relative: 19 % (ref 12–46)
MCH: 30.7 pg (ref 26.0–34.0)
MCHC: 32.6 g/dL (ref 30.0–36.0)
MCV: 94 fL (ref 78.0–100.0)
MONO ABS: 0.5 10*3/uL (ref 0.1–1.0)
MONOS PCT: 6 % (ref 3–12)
Neutro Abs: 5.1 10*3/uL (ref 1.7–7.7)
Neutrophils Relative %: 73 % (ref 43–77)
Platelets: 243 10*3/uL (ref 150–400)
RBC: 4.14 MIL/uL (ref 3.87–5.11)
RDW: 14.1 % (ref 11.5–15.5)
WBC: 7 10*3/uL (ref 4.0–10.5)

## 2013-07-23 LAB — URINE MICROSCOPIC-ADD ON

## 2013-07-23 LAB — I-STAT TROPONIN, ED: TROPONIN I, POC: 0 ng/mL (ref 0.00–0.08)

## 2013-07-23 MED ORDER — OXYCODONE-ACETAMINOPHEN 5-325 MG PO TABS
1.0000 | ORAL_TABLET | Freq: Once | ORAL | Status: AC
  Administered 2013-07-23: 1 via ORAL
  Filled 2013-07-23: qty 1

## 2013-07-23 MED ORDER — HYDROMORPHONE HCL PF 1 MG/ML IJ SOLN
2.0000 mg | Freq: Once | INTRAMUSCULAR | Status: AC
  Administered 2013-07-23: 2 mg via INTRAVENOUS
  Filled 2013-07-23: qty 2

## 2013-07-23 MED ORDER — OXYCODONE-ACETAMINOPHEN 5-325 MG PO TABS: 2.0000 | ORAL_TABLET | ORAL | Status: DC | PRN

## 2013-07-23 MED ORDER — DEXTROSE 5 % IV SOLN
1.0000 g | Freq: Once | INTRAVENOUS | Status: AC
  Administered 2013-07-23: 1 g via INTRAVENOUS
  Filled 2013-07-23: qty 10

## 2013-07-23 MED ORDER — CEPHALEXIN 500 MG PO CAPS: 500.0000 mg | ORAL_CAPSULE | Freq: Four times a day (QID) | ORAL | Status: DC

## 2013-07-23 MED ORDER — SODIUM CHLORIDE 0.9 % IV BOLUS (SEPSIS)
1000.0000 mL | Freq: Once | INTRAVENOUS | Status: AC
  Administered 2013-07-23: 1000 mL via INTRAVENOUS

## 2013-07-23 MED ORDER — ONDANSETRON HCL 4 MG/2ML IJ SOLN
4.0000 mg | Freq: Once | INTRAMUSCULAR | Status: AC
  Administered 2013-07-23: 4 mg via INTRAVENOUS
  Filled 2013-07-23: qty 2

## 2013-07-23 NOTE — ED Provider Notes (Signed)
CSN: [id number]     Arrival date & time 07/22/13  2339 History   First MD Initiated Contact with Patient 07/23/13 0405     Chief Complaint  Patient presents with  . Abdominal Pain     (Consider location/radiation/quality/duration/timing/severity/associated sxs/prior Treatment) HPI Comments: Patient is a 48 year old female with history of chronic abdominal pain. She frequents the ER for similar episodes. She is currently undergoing workup by [facility] gastroenterology, however no cause of her discomfort has been found. She has had colonoscopy, imaging studies, however none of these have given the etiology. She was recently admitted for a similar episode during which time there was possibly mild inflammation of the ascending colon.  She presents today with complaints of severe abdominal pain that started up again today. She denies any fevers or chills. She denies any bloody stool.  Patient is a 47 y.o. female presenting with abdominal pain. The history is provided by the patient.  Abdominal Pain Pain location:  LLQ Pain quality: cramping   Pain radiates to:  Does not radiate Pain severity:  Severe Onset quality:  Gradual Duration:  1 day Timing:  Constant Progression:  Worsening Chronicity:  Chronic Relieved by:  Nothing   Past Medical History  Diagnosis Date  . Rheumatoid arthritis   . Gastroparesis   . Degenerative disc disease   . Fibromyalgia   . Diverticulitis   . Thyroid disease   . Lupus   . Porphyria    Past Surgical History  Procedure Laterality Date  . Cesarean section    . Replacement total knee Bilateral   . Abdominal hysterectomy    . Cholecystectomy     Family History  Problem Relation Age of Onset  . Leukemia Mother    History  Substance Use Topics  . Smoking status: Never Smoker   . Smokeless tobacco: Not on file  . Alcohol Use: Yes     Comment: social   OB History   Grav Para Term Preterm Abortions TAB SAB Ect Mult Living                 Review  of Systems  Gastrointestinal: Positive for abdominal pain.  All other systems reviewed and are negative.     Allergies  Methotrexate derivatives; Morphine and related; and Relafen  Home Medications   Prior to Admission medications   Medication Sig Start Date End Date Taking? Authorizing Provider  gabapentin (NEURONTIN) 600 MG tablet Take 600 mg by mouth 3 (three) times daily.   Yes Historical Provider, MD  levothyroxine (SYNTHROID, LEVOTHROID) 25 MCG tablet Take 25 mcg by mouth daily before breakfast. Take with 388mcg to = 377mcg total   Yes Historical Provider, MD  levothyroxine (SYNTHROID, LEVOTHROID) 300 MCG tablet Take 300 mcg by mouth daily before breakfast. Take with a 90mcg tablet to = 378mcg   Yes Historical Provider, MD  methocarbamol (ROBAXIN) 500 MG tablet Take 500 mg by mouth 4 (four) times daily.   Yes Historical Provider, MD  metoCLOPramide (REGLAN) 10 MG tablet Take 1 tablet (10 mg total) by mouth 4 (four) times daily. 05/03/13  Yes Carlisle Cater, PA-C  omeprazole (PRILOSEC) 20 MG capsule Take 1 capsule (20 mg total) by mouth 2 (two) times daily before a meal. 03/19/13  Yes Lauren Burnetta Sabin, PA-C  ondansetron (ZOFRAN ODT) 4 MG disintegrating tablet Take 1 tablet (4 mg total) by mouth every 8 (eight) hours as needed for nausea or vomiting. 03/19/13  Yes Lorrine Kin, PA-C  oxyCODONE (OXY IR/ROXICODONE)  5 MG immediate release tablet Take 5 mg by mouth every 4 (four) hours as needed for severe pain.   Yes Historical Provider, MD  promethazine (PHENERGAN) 25 MG suppository Place 25 mg rectally every 6 (six) hours as needed for nausea or vomiting.   Yes Historical Provider, MD  promethazine (PHENERGAN) 25 MG tablet Take 25 mg by mouth 2 (two) times daily as needed for nausea.    Yes Historical Provider, MD  tizanidine (ZANAFLEX) 2 MG capsule Take 2-4 mg by mouth 3 (three) times daily as needed for muscle spasms.  09/29/12  Yes Historical Provider, MD   BP 120/81  Pulse 79  Temp(Src)  99.7 F (37.6 C) (Oral)  Resp 20  Ht 5\' 1"  (1.549 m)  Wt 183 lb (83.008 kg)  BMI 34.60 kg/m2  SpO2 99% Physical Exam  Nursing note and vitals reviewed. Constitutional: She is oriented to person, place, and time. She appears well-developed and well-nourished. No distress.  HENT:  Head: Normocephalic and atraumatic.  Neck: Normal range of motion. Neck supple.  Cardiovascular: Normal rate and regular rhythm.  Exam reveals no gallop and no friction rub.   No murmur heard. Pulmonary/Chest: Effort normal and breath sounds normal. No respiratory distress. She has no wheezes.  Abdominal: Soft. Bowel sounds are normal. She exhibits no distension. There is tenderness. There is no rebound and no guarding.  There is tenderness to palpation in the left lower clodronate.  Musculoskeletal: Normal range of motion.  Neurological: She is alert and oriented to person, place, and time.  Skin: Skin is warm and dry. She is not diaphoretic.    ED Course  Procedures (including critical care time) Labs Review Labs Reviewed  COMPREHENSIVE METABOLIC PANEL - Abnormal; Notable for the following:    Chloride 95 (*)    Glucose, Bld 104 (*)    Creatinine, Ser 1.19 (*)    Total Protein 8.9 (*)    Alkaline Phosphatase 129 (*)    GFR calc non Af Amer 52 (*)    GFR calc Af Amer 61 (*)    All other components within normal limits  CBC WITH DIFFERENTIAL  URINALYSIS, ROUTINE W REFLEX MICROSCOPIC  I-STAT TROPOININ, ED    Imaging Review No results found.   EKG Interpretation None      MDM   Final diagnoses:  None    Patient is a 48 year old female with history of chronic abdominal pain. She is currently undergoing workup with gastroenterology. She presents today with a recurrence of this discomfort. She was given IV fluids and several doses of pain medications and is feeling better and wants to go home. Workup reveals no elevation of white count, normal LFTs, however urinalysis does reveal a  significant UTI. She will be given IV Rocephin and discharge with Keflex and pain medication. She is to followup with her gastroenterologist and return if she develops worsening pain.  She appears nontoxic, is afebrile, and has no white count. Her abdomen reveals tenderness in the left lower quadrant, but no peritoneal signs. She has already undergone 2 CT scans in 2015 and I do not feel as though it is in her best interest to be subjected to another.    Veryl Speak, MD 07/23/13 [phone]

## 2013-07-23 NOTE — Discharge Instructions (Signed)
Keflex as prescribed.  Percocet as prescribed for pain.  Followup with your gastroenterologist.   Abdominal Pain, Adult Many things can cause abdominal pain. Usually, abdominal pain is not caused by a disease and will improve without treatment. It can often be observed and treated at home. Your health care provider will do a physical exam and possibly order blood tests and X-rays to help determine the seriousness of your pain. However, in many cases, more time must pass before a clear cause of the pain can be found. Before that point, your health care provider may not know if you need more testing or further treatment. HOME CARE INSTRUCTIONS  Monitor your abdominal pain for any changes. The following actions may help to alleviate any discomfort you are experiencing:  Only take over-the-counter or prescription medicines as directed by your health care provider.  Do not take laxatives unless directed to do so by your health care provider.  Try a clear liquid diet (broth, tea, or water) as directed by your health care provider. Slowly move to a bland diet as tolerated. SEEK MEDICAL CARE IF:  You have unexplained abdominal pain.  You have abdominal pain associated with nausea or diarrhea.  You have pain when you urinate or have a bowel movement.  You experience abdominal pain that wakes you in the night.  You have abdominal pain that is worsened or improved by eating food.  You have abdominal pain that is worsened with eating fatty foods. SEEK IMMEDIATE MEDICAL CARE IF:   Your pain does not go away within 2 hours.  You have a fever.  You keep throwing up (vomiting).  Your pain is felt only in portions of the abdomen, such as the right side or the left lower portion of the abdomen.  You pass bloody or black tarry stools. MAKE SURE YOU:  Understand these instructions.   Will watch your condition.   Will get help right away if you are not doing well or get worse.  Document  Released: 11/08/2004 Document Revised: 11/19/2012 Document Reviewed: 10/08/2012 [facility] Patient Information 2014 [place].  Urinary Tract Infection Urinary tract infections (UTIs) can develop anywhere along your urinary tract. Your urinary tract is your body's drainage system for removing wastes and extra water. Your urinary tract includes two kidneys, two ureters, a bladder, and a urethra. Your kidneys are a pair of bean-shaped organs. Each kidney is about the size of your fist. They are located below your ribs, one on each side of your spine. CAUSES Infections are caused by microbes, which are microscopic organisms, including fungi, viruses, and bacteria. These organisms are so small that they can only be seen through a microscope. Bacteria are the microbes that most commonly cause UTIs. SYMPTOMS  Symptoms of UTIs may vary by age and gender of the patient and by the location of the infection. Symptoms in young women typically include a frequent and intense urge to urinate and a painful, burning feeling in the bladder or urethra during urination. Older women and men are more likely to be tired, shaky, and weak and have muscle aches and abdominal pain. A fever may mean the infection is in your kidneys. Other symptoms of a kidney infection include pain in your back or sides below the ribs, nausea, and vomiting. DIAGNOSIS To diagnose a UTI, your caregiver will ask you about your symptoms. Your caregiver also will ask to provide a urine sample. The urine sample will be tested for bacteria and white blood cells. White blood  cells are made by your body to help fight infection. TREATMENT  Typically, UTIs can be treated with medication. Because most UTIs are caused by a bacterial infection, they usually can be treated with the use of antibiotics. The choice of antibiotic and length of treatment depend on your symptoms and the type of bacteria causing your infection. HOME CARE INSTRUCTIONS  If you  were prescribed antibiotics, take them exactly as your caregiver instructs you. Finish the medication even if you feel better after you have only taken some of the medication.  Drink enough water and fluids to keep your urine clear or pale yellow.  Avoid caffeine, tea, and carbonated beverages. They tend to irritate your bladder.  Empty your bladder often. Avoid holding urine for long periods of time.  Empty your bladder before and after sexual intercourse.  After a bowel movement, women should cleanse from front to back. Use each tissue only once. SEEK MEDICAL CARE IF:   You have back pain.  You develop a fever.  Your symptoms do not begin to resolve within 3 days. SEEK IMMEDIATE MEDICAL CARE IF:   You have severe back pain or lower abdominal pain.  You develop chills.  You have nausea or vomiting.  You have continued burning or discomfort with urination. MAKE SURE YOU:   Understand these instructions.  Will watch your condition.  Will get help right away if you are not doing well or get worse. Document Released: 11/08/2004 Document Revised: 07/31/2011 Document Reviewed: 03/09/2011 [facility] Patient Information 2014 [place].

## 2013-07-23 NOTE — ED Notes (Signed)
Pt reports Hx of diverticulitis, pt reports this feels like previous exacerbation. Pt reports some dark looking stools. Pt reports n/v throughout the day. Pt ax4, NAD.

## 2013-07-23 NOTE — ED Notes (Signed)
Patient states she has port-o-cath, states that she wants this port accessed and blood to be drawn and medications to be administered by this route only. RN notified patient that this could cause delay in results. Pt understands.

## 2013-07-23 NOTE — ED Notes (Signed)
Pt A&OX4, ambulatory at d/c with steady gait, NAD 

## 2013-07-23 NOTE — ED Notes (Signed)
Port de-accessed

## 2013-07-24 LAB — URINE CULTURE: SPECIAL REQUESTS: NORMAL

## 2013-07-25 ENCOUNTER — Inpatient Hospital Stay (HOSPITAL_COMMUNITY)
Admission: EM | Admit: 2013-07-25 | Discharge: 2013-07-28 | DRG: 392 | Disposition: A | Discharge status: Home / Self Care | Payer: Medicare Other | Attending: Internal Medicine | Admitting: Internal Medicine

## 2013-07-25 ENCOUNTER — Encounter (HOSPITAL_COMMUNITY): Payer: Self-pay | Admitting: Emergency Medicine

## 2013-07-25 DIAGNOSIS — E785 Hyperlipidemia, unspecified: Secondary | ICD-10-CM | POA: Diagnosis present

## 2013-07-25 DIAGNOSIS — R52 Pain, unspecified: Secondary | ICD-10-CM

## 2013-07-25 DIAGNOSIS — R4586 Emotional lability: Secondary | ICD-10-CM | POA: Diagnosis present

## 2013-07-25 DIAGNOSIS — N183 Chronic kidney disease, stage 3 unspecified: Secondary | ICD-10-CM | POA: Diagnosis present

## 2013-07-25 DIAGNOSIS — E039 Hypothyroidism, unspecified: Secondary | ICD-10-CM | POA: Diagnosis present

## 2013-07-25 DIAGNOSIS — R112 Nausea with vomiting, unspecified: Secondary | ICD-10-CM

## 2013-07-25 DIAGNOSIS — Z888 Allergy status to other drugs, medicaments and biological substances status: Secondary | ICD-10-CM

## 2013-07-25 DIAGNOSIS — Z96659 Presence of unspecified artificial knee joint: Secondary | ICD-10-CM

## 2013-07-25 DIAGNOSIS — M329 Systemic lupus erythematosus, unspecified: Secondary | ICD-10-CM | POA: Diagnosis present

## 2013-07-25 DIAGNOSIS — N390 Urinary tract infection, site not specified: Secondary | ICD-10-CM | POA: Diagnosis present

## 2013-07-25 DIAGNOSIS — M069 Rheumatoid arthritis, unspecified: Secondary | ICD-10-CM | POA: Diagnosis present

## 2013-07-25 DIAGNOSIS — Z79899 Other long term (current) drug therapy: Secondary | ICD-10-CM

## 2013-07-25 DIAGNOSIS — K589 Irritable bowel syndrome without diarrhea: Principal | ICD-10-CM | POA: Diagnosis present

## 2013-07-25 DIAGNOSIS — K5900 Constipation, unspecified: Secondary | ICD-10-CM | POA: Diagnosis present

## 2013-07-25 DIAGNOSIS — K3184 Gastroparesis: Secondary | ICD-10-CM | POA: Diagnosis present

## 2013-07-25 DIAGNOSIS — N179 Acute kidney failure, unspecified: Secondary | ICD-10-CM | POA: Diagnosis present

## 2013-07-25 DIAGNOSIS — G8929 Other chronic pain: Secondary | ICD-10-CM | POA: Diagnosis present

## 2013-07-25 DIAGNOSIS — IMO0002 Reserved for concepts with insufficient information to code with codable children: Secondary | ICD-10-CM | POA: Diagnosis present

## 2013-07-25 DIAGNOSIS — R109 Unspecified abdominal pain: Secondary | ICD-10-CM

## 2013-07-25 DIAGNOSIS — Z806 Family history of leukemia: Secondary | ICD-10-CM

## 2013-07-25 DIAGNOSIS — IMO0001 Reserved for inherently not codable concepts without codable children: Secondary | ICD-10-CM | POA: Diagnosis present

## 2013-07-25 LAB — COMPREHENSIVE METABOLIC PANEL
ALBUMIN: 3.9 g/dL (ref 3.5–5.2)
ALK PHOS: 89 U/L (ref 39–117)
ALT: 13 U/L (ref 0–35)
AST: 15 U/L (ref 0–37)
BILIRUBIN TOTAL: 0.4 mg/dL (ref 0.3–1.2)
BUN: 12 mg/dL (ref 6–23)
CO2: 22 mEq/L (ref 19–32)
Calcium: 9.2 mg/dL (ref 8.4–10.5)
Chloride: 103 mEq/L (ref 96–112)
Creatinine, Ser: 1.09 mg/dL (ref 0.50–1.10)
GFR calc Af Amer: 67 mL/min — ABNORMAL LOW (ref >90)
GFR calc non Af Amer: 58 mL/min — ABNORMAL LOW (ref >90)
Glucose, Bld: 104 mg/dL — ABNORMAL HIGH (ref 70–99)
POTASSIUM: 3.5 meq/L — AB (ref 3.7–5.3)
Sodium: 141 mEq/L (ref 137–147)
Total Protein: 7.4 g/dL (ref 6.0–8.3)

## 2013-07-25 LAB — CBC WITH DIFFERENTIAL/PLATELET
Basophils Absolute: 0 10*3/uL (ref 0.0–0.1)
Basophils Relative: 0 % (ref 0–1)
Eosinophils Absolute: 0.2 10*3/uL (ref 0.0–0.7)
Eosinophils Relative: 3 % (ref 0–5)
HCT: 35.7 % — ABNORMAL LOW (ref 36.0–46.0)
HEMOGLOBIN: 11.5 g/dL — AB (ref 12.0–15.0)
Lymphocytes Relative: 34 % (ref 12–46)
Lymphs Abs: 1.8 10*3/uL (ref 0.7–4.0)
MCH: 30.9 pg (ref 26.0–34.0)
MCHC: 32.2 g/dL (ref 30.0–36.0)
MCV: 96 fL (ref 78.0–100.0)
MONOS PCT: 5 % (ref 3–12)
Monocytes Absolute: 0.3 10*3/uL (ref 0.1–1.0)
Neutro Abs: 3.1 10*3/uL (ref 1.7–7.7)
Neutrophils Relative %: 58 % (ref 43–77)
Platelets: 208 10*3/uL (ref 150–400)
RBC: 3.72 MIL/uL — AB (ref 3.87–5.11)
RDW: 14.2 % (ref 11.5–15.5)
WBC: 5.3 10*3/uL (ref 4.0–10.5)

## 2013-07-25 LAB — URINE MICROSCOPIC-ADD ON

## 2013-07-25 LAB — URINALYSIS, ROUTINE W REFLEX MICROSCOPIC
BILIRUBIN URINE: NEGATIVE
Glucose, UA: NEGATIVE mg/dL
Hgb urine dipstick: NEGATIVE
KETONES UR: NEGATIVE mg/dL
Nitrite: NEGATIVE
Protein, ur: NEGATIVE mg/dL
SPECIFIC GRAVITY, URINE: 1.026 (ref 1.005–1.030)
UROBILINOGEN UA: 1 mg/dL (ref 0.0–1.0)
pH: 5.5 (ref 5.0–8.0)

## 2013-07-25 LAB — LIPASE, BLOOD: Lipase: 15 U/L (ref 11–59)

## 2013-07-25 MED ORDER — HYDROMORPHONE HCL PF 1 MG/ML IJ SOLN
1.0000 mg | Freq: Once | INTRAMUSCULAR | Status: AC
Start: 2013-07-26 — End: 2013-07-25
  Administered 2013-07-25: 1 mg via INTRAVENOUS
  Filled 2013-07-25: qty 1

## 2013-07-25 MED ORDER — SODIUM CHLORIDE 0.9 % IJ SOLN
10.0000 mL | INTRAMUSCULAR | Status: DC | PRN
  Administered 2013-07-26 – 2013-07-28 (×2): 10 mL

## 2013-07-25 MED ORDER — SODIUM CHLORIDE 0.9 % IV BOLUS (SEPSIS)
1000.0000 mL | Freq: Once | INTRAVENOUS | Status: AC
  Administered 2013-07-25: 1000 mL via INTRAVENOUS

## 2013-07-25 MED ORDER — ONDANSETRON 4 MG PO TBDP
8.0000 mg | ORAL_TABLET | Freq: Once | ORAL | Status: AC
  Administered 2013-07-25: 8 mg via ORAL
  Filled 2013-07-25: qty 2

## 2013-07-25 MED ORDER — METOCLOPRAMIDE HCL 5 MG/ML IJ SOLN
10.0000 mg | INTRAMUSCULAR | Status: AC
  Administered 2013-07-25: 10 mg via INTRAVENOUS
  Filled 2013-07-25: qty 2

## 2013-07-25 MED ORDER — HYDROMORPHONE HCL PF 1 MG/ML IJ SOLN
1.0000 mg | Freq: Once | INTRAMUSCULAR | Status: AC
  Administered 2013-07-25: 1 mg via INTRAVENOUS
  Filled 2013-07-25: qty 1

## 2013-07-25 NOTE — ED Notes (Signed)
Pt arrives w/ c/o abdominal pain/nausea/vomiting, seen here in ED three days ago with same problem and has gotten worse today. Pt refusing PO intake d/t nausea/pain/fear of vomiting.

## 2013-07-25 NOTE — ED Notes (Signed)
Pt states she has a chronic condition that has been off and on for eight years and has been worse lately. Was seen here recently for same. Pt requests no IV and to have her port accessed if needed.

## 2013-07-25 NOTE — ED Notes (Signed)
IV TEAM accessed port. Okay to give meds.

## 2013-07-25 NOTE — ED Provider Notes (Signed)
CSN: [id number]     Arrival date & time 07/25/13  2020 History   First MD Initiated Contact with Patient 07/25/13 2120     Chief Complaint  Patient presents with  . Abdominal Pain  . Emesis   HPI  Adrienne Flores is a 48 y.o. female with a PMH of gastroparesis, RA, DDD, fibromyalgia, diverticulitis, thyroid disease, lupus, and porphyria who presents to the ED for evaluation of abdominal pain and emesis. History was provided by the patient. Patient states she has had constant lower abdominal pain in the lower abdomen diffusely for years. Her pain has been worse the past few days. Her pain is described as a constant stabbing pain which is worse with eating or drinking. Patient seen in the ED a few days ago on 07/23/13 and was prescribed Keflex and oxycodone which she has been taking. She has not follow-up with anyone after discharge. Patient states she took her Reglan at home but has also has had nausea and vomiting. Has had mild constipation. No dysuria, vaginal discharge/bleeding, or genital sores. She denies any fever, chills. No other concerns. Previous abdominal surgeries include a hysterectomy and cholecystectomy. Patient has a GI specialist at [place].    Past Medical History  Diagnosis Date  . Rheumatoid arthritis   . Gastroparesis   . Degenerative disc disease   . Fibromyalgia   . Diverticulitis   . Thyroid disease   . Lupus   . Porphyria    Past Surgical History  Procedure Laterality Date  . Cesarean section    . Replacement total knee Bilateral   . Abdominal hysterectomy    . Cholecystectomy     Family History  Problem Relation Age of Onset  . Leukemia Mother    History  Substance Use Topics  . Smoking status: Never Smoker   . Smokeless tobacco: Not on file  . Alcohol Use: Yes     Comment: social   OB History   Grav Para Term Preterm Abortions TAB SAB Ect Mult Living                  Review of Systems  Constitutional: Negative for fever, chills,  activity change, appetite change and fatigue.  Respiratory: Negative for cough and shortness of breath.   Cardiovascular: Negative for chest pain and leg swelling.  Gastrointestinal: Positive for nausea, vomiting, abdominal pain and constipation. Negative for diarrhea and blood in stool.  Genitourinary: Negative for dysuria, urgency, frequency, hematuria, decreased urine volume, vaginal bleeding, vaginal discharge, difficulty urinating, genital sores, vaginal pain and pelvic pain.  Musculoskeletal: Negative for back pain.  Neurological: Negative for dizziness, weakness, light-headedness and headaches.     Allergies  Methotrexate derivatives; Morphine and related; and Relafen  Home Medications   Prior to Admission medications   Medication Sig Start Date End Date Taking? Authorizing Provider  cephALEXin (KEFLEX) 500 MG capsule Take 1 capsule (500 mg total) by mouth 4 (four) times daily. 07/23/13  Yes Veryl Speak, MD  gabapentin (NEURONTIN) 600 MG tablet Take 600 mg by mouth 3 (three) times daily.   Yes Historical Provider, MD  levothyroxine (SYNTHROID, LEVOTHROID) 25 MCG tablet Take 25 mcg by mouth daily before breakfast. Take with 339mcg to = 344mcg total   Yes Historical Provider, MD  levothyroxine (SYNTHROID, LEVOTHROID) 300 MCG tablet Take 300 mcg by mouth daily before breakfast. Take with a 86mcg tablet to = 324mcg   Yes Historical Provider, MD  omeprazole (PRILOSEC) 20 MG capsule Take 1  capsule (20 mg total) by mouth 2 (two) times daily before a meal. 03/19/13  Yes Lauren Burnetta Sabin, PA-C  ondansetron (ZOFRAN ODT) 4 MG disintegrating tablet Take 1 tablet (4 mg total) by mouth every 8 (eight) hours as needed for nausea or vomiting. 03/19/13  Yes Lauren Burnetta Sabin, PA-C  oxyCODONE (OXY IR/ROXICODONE) 5 MG immediate release tablet Take 5 mg by mouth every 4 (four) hours as needed for severe pain.   Yes Historical Provider, MD  oxyCODONE-acetaminophen (PERCOCET) 5-325 MG per tablet Take 2 tablets by  mouth every 4 (four) hours as needed. 07/23/13  Yes Veryl Speak, MD  tizanidine (ZANAFLEX) 2 MG capsule Take 2-4 mg by mouth 3 (three) times daily as needed for muscle spasms.  09/29/12  Yes Historical Provider, MD  metoCLOPramide (REGLAN) 10 MG tablet Take 1 tablet (10 mg total) by mouth 4 (four) times daily. 05/03/13   Carlisle Cater, PA-C   BP 107/73  Pulse 66  Temp(Src) 98.1 F (36.7 C) (Oral)  Resp 14  Ht 5\' 1"  (1.549 m)  Wt 183 lb (83.008 kg)  BMI 34.60 kg/m2  SpO2 99%  Filed Vitals:   07/25/13 2118 07/25/13 2233 07/25/13 2300 07/26/13 0014  BP: 107/73 117/90 124/82 122/77  Pulse: 66 63 69 75  Temp:      TempSrc:      Resp:  10 14 16   Height:      Weight:      SpO2: 99% 100% 100% 100%    Physical Exam  Nursing note and vitals reviewed. Constitutional: She is oriented to person, place, and time. She appears well-developed and well-nourished. No distress.  Patient tearful  HENT:  Head: Normocephalic and atraumatic.  Right Ear: External ear normal.  Left Ear: External ear normal.  Nose: Nose normal.  Dry mucus membranes  Eyes: Conjunctivae are normal. Right eye exhibits no discharge. Left eye exhibits no discharge.  Neck: Normal range of motion. Neck supple.  Cardiovascular: Normal rate, regular rhythm and normal heart sounds.  Exam reveals no gallop and no friction rub.   No murmur heard. Pulmonary/Chest: Effort normal and breath sounds normal. No respiratory distress. She has no wheezes. She has no rales. She exhibits no tenderness.  Abdominal: Soft. She exhibits no distension and no mass. There is tenderness. There is no rebound and no guarding.  Diffuse tenderness to palpation to the lower abdomen   Musculoskeletal: Normal range of motion. She exhibits no edema and no tenderness.  Neurological: She is alert and oriented to person, place, and time.  Skin: Skin is warm and dry. She is not diaphoretic.    ED Course  Procedures (including critical care time) Labs  Review Labs Reviewed  CBC WITH DIFFERENTIAL - Abnormal; Notable for the following:    RBC 3.72 (*)    Hemoglobin 11.5 (*)    HCT 35.7 (*)    All other components within normal limits  URINALYSIS, ROUTINE W REFLEX MICROSCOPIC  COMPREHENSIVE METABOLIC PANEL  LIPASE, BLOOD    Imaging Review No results found.   EKG Interpretation None      Results for orders placed during the hospital encounter of 07/25/13  URINALYSIS, ROUTINE W REFLEX MICROSCOPIC      Result Value Ref Range   Color, Urine YELLOW  YELLOW   APPearance CLOUDY (*) CLEAR   Specific Gravity, Urine 1.026  1.005 - 1.030   pH 5.5  5.0 - 8.0   Glucose, UA NEGATIVE  NEGATIVE mg/dL   Hgb urine dipstick  NEGATIVE  NEGATIVE   Bilirubin Urine NEGATIVE  NEGATIVE   Ketones, ur NEGATIVE  NEGATIVE mg/dL   Protein, ur NEGATIVE  NEGATIVE mg/dL   Urobilinogen, UA 1.0  0.0 - 1.0 mg/dL   Nitrite NEGATIVE  NEGATIVE   Leukocytes, UA MODERATE (*) NEGATIVE  CBC WITH DIFFERENTIAL      Result Value Ref Range   WBC 5.3  4.0 - 10.5 K/uL   RBC 3.72 (*) 3.87 - 5.11 MIL/uL   Hemoglobin 11.5 (*) 12.0 - 15.0 g/dL   HCT 35.7 (*) 36.0 - 46.0 %   MCV 96.0  78.0 - 100.0 fL   MCH 30.9  26.0 - 34.0 pg   MCHC 32.2  30.0 - 36.0 g/dL   RDW 14.2  11.5 - 15.5 %   Platelets 208  150 - 400 K/uL   Neutrophils Relative % 58  43 - 77 %   Neutro Abs 3.1  1.7 - 7.7 K/uL   Lymphocytes Relative 34  12 - 46 %   Lymphs Abs 1.8  0.7 - 4.0 K/uL   Monocytes Relative 5  3 - 12 %   Monocytes Absolute 0.3  0.1 - 1.0 K/uL   Eosinophils Relative 3  0 - 5 %   Eosinophils Absolute 0.2  0.0 - 0.7 K/uL   Basophils Relative 0  0 - 1 %   Basophils Absolute 0.0  0.0 - 0.1 K/uL  COMPREHENSIVE METABOLIC PANEL      Result Value Ref Range   Sodium 141  137 - 147 mEq/L   Potassium 3.5 (*) 3.7 - 5.3 mEq/L   Chloride 103  96 - 112 mEq/L   CO2 22  19 - 32 mEq/L   Glucose, Bld 104 (*) 70 - 99 mg/dL   BUN 12  6 - 23 mg/dL   Creatinine, Ser 1.09  0.50 - 1.10 mg/dL    Calcium 9.2  8.4 - 10.5 mg/dL   Total Protein 7.4  6.0 - 8.3 g/dL   Albumin 3.9  3.5 - 5.2 g/dL   AST 15  0 - 37 U/L   ALT 13  0 - 35 U/L   Alkaline Phosphatase 89  39 - 117 U/L   Total Bilirubin 0.4  0.3 - 1.2 mg/dL   GFR calc non Af Amer 58 (*) >90 mL/min   GFR calc Af Amer 67 (*) >90 mL/min  LIPASE, BLOOD      Result Value Ref Range   Lipase 15  11 - 59 U/L  URINE MICROSCOPIC-ADD ON      Result Value Ref Range   Squamous Epithelial / LPF MANY (*) RARE   WBC, UA 7-10  <3 WBC/hpf   Bacteria, UA MANY (*) RARE   Crystals CA OXALATE CRYSTALS (*) NEGATIVE     MDM   Adrienne Flores is a 48 y.o. female with a PMH of gastroparesis, RA, DDD, fibromyalgia, diverticulitis, thyroid disease, lupus, and porphyria who presents to the ED for evaluation of abdominal pain and emesis. Etiology of abdominal pain likely chronic in nature. Patient states her pain is unchanged from her previous abdominal pain in the past. Patient admitted for chronic abdominal pain control. Patient given 1 mg dilaudid x 3, Zofran, Reglan, and 2L IV fluids with no improvement in her pain or nausesa. Abdominal exam benign. Vital signs stable. Patient recently seen in the ED for this a few days ago (07/23/13) and was unable to control her pain as an OP. Patient previously treated for  a UTI with Keflex on previous ED visit, which does not appear to have cleared at this point. Labs unremarkable. Patient in agreement with admission and plan.    Rechecks  12:00 AM = Pain continues to be a 10/10. Ordering another 1 mg dilaudid.  1:00 PM = No improvements in pain. Ordering another 1 mg dilaudid.  1:30 AM = Pain 10/10. Patient crying.     Final impressions: 1. Chronic abdominal pain   2. Abdominal pain   3. Nausea and vomiting   4. AKI (acute kidney injury)   5. Porphyria   6. Rheumatoid arthritis   7. Unspecified hypothyroidism   8. UTI (lower urinary tract infection)       Harold Hedge Harrol Novello PA-C            Lucila Maine, PA-C 07/26/13 [phone]

## 2013-07-25 NOTE — ED Notes (Signed)
Pt has power port on right side

## 2013-07-26 ENCOUNTER — Encounter (HOSPITAL_COMMUNITY): Payer: Self-pay

## 2013-07-26 DIAGNOSIS — M069 Rheumatoid arthritis, unspecified: Secondary | ICD-10-CM

## 2013-07-26 DIAGNOSIS — R109 Unspecified abdominal pain: Secondary | ICD-10-CM

## 2013-07-26 DIAGNOSIS — G8929 Other chronic pain: Secondary | ICD-10-CM

## 2013-07-26 DIAGNOSIS — N390 Urinary tract infection, site not specified: Secondary | ICD-10-CM

## 2013-07-26 DIAGNOSIS — R112 Nausea with vomiting, unspecified: Secondary | ICD-10-CM

## 2013-07-26 DIAGNOSIS — N179 Acute kidney failure, unspecified: Secondary | ICD-10-CM

## 2013-07-26 DIAGNOSIS — E039 Hypothyroidism, unspecified: Secondary | ICD-10-CM

## 2013-07-26 DIAGNOSIS — R52 Pain, unspecified: Secondary | ICD-10-CM

## 2013-07-26 LAB — CBC
HCT: 31 % — ABNORMAL LOW (ref 36.0–46.0)
HEMOGLOBIN: 10.1 g/dL — AB (ref 12.0–15.0)
MCH: 30.5 pg (ref 26.0–34.0)
MCHC: 32.6 g/dL (ref 30.0–36.0)
MCV: 93.7 fL (ref 78.0–100.0)
Platelets: 203 10*3/uL (ref 150–400)
RBC: 3.31 MIL/uL — AB (ref 3.87–5.11)
RDW: 13.8 % (ref 11.5–15.5)
WBC: 4.6 10*3/uL (ref 4.0–10.5)

## 2013-07-26 LAB — BASIC METABOLIC PANEL
BUN: 9 mg/dL (ref 6–23)
CO2: 22 mEq/L (ref 19–32)
Calcium: 8.6 mg/dL (ref 8.4–10.5)
Chloride: 108 mEq/L (ref 96–112)
Creatinine, Ser: 1.01 mg/dL (ref 0.50–1.10)
GFR calc Af Amer: 74 mL/min — ABNORMAL LOW (ref >90)
GFR calc non Af Amer: 64 mL/min — ABNORMAL LOW (ref >90)
Glucose, Bld: 87 mg/dL (ref 70–99)
Potassium: 3.7 mEq/L (ref 3.7–5.3)
SODIUM: 146 meq/L (ref 137–147)

## 2013-07-26 MED ORDER — HYDROMORPHONE HCL PF 1 MG/ML IJ SOLN
1.0000 mg | Freq: Once | INTRAMUSCULAR | Status: AC
Start: 2013-07-26 — End: 2013-07-26
  Administered 2013-07-26: 1 mg via INTRAVENOUS
  Filled 2013-07-26: qty 1

## 2013-07-26 MED ORDER — FENTANYL CITRATE 0.05 MG/ML IJ SOLN: 25.0000 ug | Freq: Once | INTRAMUSCULAR | Status: DC

## 2013-07-26 MED ORDER — HYDROMORPHONE HCL PF 1 MG/ML IJ SOLN: 1.0000 mg | INTRAMUSCULAR | Status: DC | PRN

## 2013-07-26 MED ORDER — LEVOTHYROXINE SODIUM 25 MCG PO TABS: 25.0000 ug | ORAL_TABLET | Freq: Every day | ORAL | Status: DC

## 2013-07-26 MED ORDER — HYDROMORPHONE HCL PF 1 MG/ML IJ SOLN
1.0000 mg | INTRAMUSCULAR | Status: DC | PRN
  Administered 2013-07-26 (×3): 2 mg via INTRAVENOUS
  Filled 2013-07-26 (×3): qty 2

## 2013-07-26 MED ORDER — CEFTRIAXONE SODIUM 1 G IJ SOLR
1.0000 g | INTRAMUSCULAR | Status: AC
  Administered 2013-07-26 – 2013-07-27 (×3): 1 g via INTRAVENOUS
  Filled 2013-07-26 (×3): qty 10

## 2013-07-26 MED ORDER — ACETAMINOPHEN 650 MG RE SUPP: 650.0000 mg | Freq: Four times a day (QID) | RECTAL | Status: DC | PRN

## 2013-07-26 MED ORDER — ONDANSETRON HCL 4 MG/2ML IJ SOLN: 4.0000 mg | Freq: Four times a day (QID) | INTRAMUSCULAR | Status: DC | PRN

## 2013-07-26 MED ORDER — GABAPENTIN 600 MG PO TABS
600.0000 mg | ORAL_TABLET | Freq: Three times a day (TID) | ORAL | Status: DC
  Administered 2013-07-26 – 2013-07-28 (×7): 600 mg via ORAL
  Filled 2013-07-26 (×10): qty 1

## 2013-07-26 MED ORDER — LEVOTHYROXINE SODIUM 150 MCG PO TABS: 300.0000 ug | ORAL_TABLET | Freq: Every day | ORAL | Status: DC

## 2013-07-26 MED ORDER — ACETAMINOPHEN 325 MG PO TABS
650.0000 mg | ORAL_TABLET | Freq: Four times a day (QID) | ORAL | Status: DC | PRN
  Administered 2013-07-27 – 2013-07-28 (×2): 650 mg via ORAL
  Filled 2013-07-26 (×2): qty 2

## 2013-07-26 MED ORDER — ONDANSETRON HCL 4 MG PO TABS: 4.0000 mg | ORAL_TABLET | Freq: Four times a day (QID) | ORAL | Status: DC | PRN

## 2013-07-26 MED ORDER — HYDROMORPHONE HCL PF 1 MG/ML IJ SOLN
1.0000 mg | INTRAMUSCULAR | Status: DC | PRN
  Administered 2013-07-26 – 2013-07-28 (×11): 1 mg via INTRAVENOUS
  Filled 2013-07-26 (×12): qty 1

## 2013-07-26 MED ORDER — LEVOTHYROXINE SODIUM 200 MCG PO TABS
325.0000 ug | ORAL_TABLET | Freq: Every day | ORAL | Status: DC
  Administered 2013-07-26 – 2013-07-28 (×3): 325 ug via ORAL
  Filled 2013-07-26 (×5): qty 1
  Filled 2013-07-26: qty 2

## 2013-07-26 MED ORDER — PROMETHAZINE HCL 25 MG/ML IJ SOLN
25.0000 mg | Freq: Once | INTRAMUSCULAR | Status: AC
  Administered 2013-07-26: 25 mg via INTRAVENOUS
  Filled 2013-07-26: qty 1

## 2013-07-26 MED ORDER — DEXTROSE-NACL 5-0.45 % IV SOLN
INTRAVENOUS | Status: DC
  Administered 2013-07-26 – 2013-07-27 (×3): via INTRAVENOUS
  Filled 2013-07-26 (×6): qty 1000

## 2013-07-26 MED ORDER — SODIUM CHLORIDE 0.9 % IV SOLN
INTRAVENOUS | Status: DC
Start: 2013-07-26 — End: 2013-07-26
  Administered 2013-07-26: 04:00:00 via INTRAVENOUS

## 2013-07-26 MED ORDER — OXYCODONE HCL 5 MG PO TABS
15.0000 mg | ORAL_TABLET | Freq: Four times a day (QID) | ORAL | Status: DC | PRN
  Administered 2013-07-26 – 2013-07-27 (×5): 15 mg via ORAL
  Filled 2013-07-26 (×5): qty 3

## 2013-07-26 MED ORDER — ALUM & MAG HYDROXIDE-SIMETH 200-200-20 MG/5ML PO SUSP: 30.0000 mL | Freq: Four times a day (QID) | ORAL | Status: DC | PRN

## 2013-07-26 MED ORDER — METOCLOPRAMIDE HCL 5 MG/ML IJ SOLN
10.0000 mg | Freq: Four times a day (QID) | INTRAMUSCULAR | Status: AC
  Administered 2013-07-26 (×4): 10 mg via INTRAVENOUS
  Filled 2013-07-26 (×5): qty 2

## 2013-07-26 MED ORDER — TIZANIDINE HCL 2 MG PO TABS
2.0000 mg | ORAL_TABLET | Freq: Three times a day (TID) | ORAL | Status: DC | PRN
  Administered 2013-07-27 – 2013-07-28 (×3): 4 mg via ORAL
  Filled 2013-07-26 (×6): qty 2

## 2013-07-26 MED ORDER — ENOXAPARIN SODIUM 40 MG/0.4ML ~~LOC~~ SOLN
40.0000 mg | Freq: Every day | SUBCUTANEOUS | Status: DC
  Administered 2013-07-26 – 2013-07-28 (×3): 40 mg via SUBCUTANEOUS
  Filled 2013-07-26 (×3): qty 0.4

## 2013-07-26 NOTE — ED Notes (Signed)
Pt's family member reporting pt's nausea returning. PA made aware.  No new orders.

## 2013-07-26 NOTE — ED Notes (Signed)
Per Dr. Arnoldo Morale, she would like phenergan and fentanyl ordered.  Phenergan to be administered first before pt to receive Fentanyl.  Pt updated on plan of care.

## 2013-07-26 NOTE — ED Notes (Signed)
Pt tearful at bedside.  RN reinformed pt of admitting physicians orders of pain management.  Pt verbalized understanding.

## 2013-07-26 NOTE — Progress Notes (Signed)
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 consult--the patient has functional bowel disease with IBS as the main source of her GI symptoms. He did not recommend any further workup at this time.  -He ordered transglutaminase antibodies--negative -Previous workup for porphyria (24hr urine porphyrobilinogen and urine porphyrins) including acute intermittent porphyria has been unremarkable as noted in October 2014 and May 2015 -Obtain stool for C. difficile PCR--no diarrhea since admission; therefore no stool was obtained  -Stool pathogen panel--no diarrhea since admission; therefore no stool was obtained  -07/25/13 Urinalysis negative for pyuria but will finish abx course from previously diagnosed UTI--ceftriaxone -Jan 2014 colonoscopy at [facility] unremarkable  -The patient's diet was advanced which she tolerated  Hypothyroidism  -Suspect poor compliance with levothyroxine as patient states that she frequently misses her doses when she feels "bad"  -Continue levothyroxine by mouth for now if patient is able to tolerate by mouth  CKD stage III  -Baseline creatinine 1.0-1.2  -Continue IV fluids  -Serum creatinine improved Uncontrolled pain  -I discussed with the patient that it is unrealistic to make her pain 0  -Continue oral opioids  -IV opioids for severe uncontrolled pain when necessary  -patient had IV opioid seeking behavior. when distracted,  the patient's pain was well-controlled  Hyperlipidemia  -LDL 201  -pt would benefit from statin-pt will f/u with PCP for followup   Family Communication:   Pt at beside Disposition Plan:   Home when medically stable       Procedures/Studies:  No results found.      Subjective: Patient states the pain is somewhat better today. No loose stools since admission. Denies any vomiting, diarrhea, chest pain, shortness breath, dizziness, headache, fevers, chills, dysuria.  Objective: Filed Vitals:   07/26/13 0148 07/26/13 0331 07/26/13 0633 07/26/13 0931  BP: 128/84 134/82 114/73 118/79  Pulse: 76 65 74 63  Temp:  98 F (36.7 C) 98.5 F (36.9 C) 98.4 F (36.9 C)  TempSrc:  Oral Oral Oral  Resp: 22 18 16 17   Height:      Weight:      SpO2: 100% 100% 100% 100%    Intake/Output Summary (Last 24 hours) at 07/26/13 1321 Last data filed at 07/26/13 0909  Gross per 24 hour  Intake 533.33 ml  Output    975 ml  Net -441.67 ml   Weight change:  Exam:   General:  Pt is alert, follows commands appropriately, not in acute distress  HEENT: No icterus, No thrush,  [place]/AT  Cardiovascular: RRR, S1/S2, no rubs, no gallops  Respiratory: CTA bilaterally, no wheezing, no crackles, no rhonchi  Abdomen: Soft/+BS, non tender when distracted, non distended, no guarding  Extremities: trace LE edema, No lymphangitis, No petechiae, No rashes, no synovitis  Data Reviewed: Basic Metabolic Panel:  Recent Labs Lab 07/23/13 0330 07/25/13 2059 07/26/13 0455  NA 138 141 146  K 4.1 3.5* 3.7  CL 95* 103 108  CO2  24 22 22   GLUCOSE 104* 104* 87  BUN 15 12 9   CREATININE 1.19* 1.09 1.01  CALCIUM 10.1 9.2 8.6   Liver Function Tests:  Recent Labs Lab 07/23/13 0330 07/25/13 2059  AST 23 15  ALT 23 13  ALKPHOS 129* 89  BILITOT 0.6 0.4  PROT 8.9* 7.4  ALBUMIN 4.6 3.9    Recent Labs Lab 07/25/13 2059  LIPASE 15   No results found for this basename: AMMONIA,  in the last 168  hours CBC:  Recent Labs Lab 07/23/13 0330 07/25/13 2059 07/26/13 0455  WBC 7.0 5.3 4.6  NEUTROABS 5.1 3.1  --   HGB 12.7 11.5* 10.1*  HCT 38.9 35.7* 31.0*  MCV 94.0 96.0 93.7  PLT 243 208 203   Cardiac Enzymes: No results found for this basename: CKTOTAL, CKMB, CKMBINDEX, TROPONINI,  in the last 168 hours BNP: No components found with this basename: POCBNP,  CBG: No results found for this basename: GLUCAP,  in the last 168 hours  Recent Results (from the past 240 hour(s))  URINE CULTURE     Status: None   Collection Time    07/23/13  6:00 AM      Result Value Ref Range Status   Specimen Description URINE, CLEAN CATCH   Final   Special Requests Normal   Final   Culture  Setup Time     Final   Value: 07/23/2013 13:17     Performed at [place]     Final   Value: 55,000 COLONIES/ML     Performed at [facility]   Culture     Final   Value: Multiple bacterial morphotypes present, none predominant. Suggest appropriate recollection if clinically indicated.     Performed at [facility]   Report Status 07/24/2013 FINAL   Final     Scheduled Meds: . cefTRIAXone (ROCEPHIN)  IV  1 g Intravenous Q24H  . enoxaparin (LOVENOX) injection  40 mg Subcutaneous Daily  . fentaNYL  25 mcg Intravenous Once  . gabapentin  600 mg Oral TID  . levothyroxine  325 mcg Oral QAC breakfast  . metoCLOPramide (REGLAN) injection  10 mg Intravenous 4 times per day   Continuous Infusions: . sodium chloride 100 mL/hr at 07/26/13 0340     Casy Brunetto, DO  [facility] Pager [phone]  If 7PM-7AM, please contact night-coverage [email or web address] Password [email or web address] 07/26/2013, 1:21 PM   LOS: 1 day

## 2013-07-26 NOTE — H&P (Signed)
[facility] History and Physical  Adrienne Flores ZGY:[id number] DOB: [birth year] DOA: 07/25/2013  Referring physician:  EDP PCP: [name], MD  Specialists:   Chief Complaint:  ABD Pain and Nausea and Vomiting  HPI: Adrienne Flores is a 48 y.o. female with a history of Rheumatoid Arthritis, Chronic ABD Pain. Gastroparesis, and Porphyria who presents to the ED with complaints of worsening ABD pain over the past 3 days.   She has had increased nausea and vomiting and can not hold down foods or liquids.   She denies havign any fevers or chills or chest pain.  She has had constipation.    Her pain is a sharp and diffuse and constant and she rates her pain at a 10/10 at this time.     Review of Systems:  Constitutional: No Weight Loss, No Weight Gain, Night Sweats, Fevers, Chills, Fatigue, or Generalized Weakness HEENT: No Headaches, Difficulty Swallowing,Tooth/Dental Problems,Sore Throat,  No Sneezing, Rhinitis, Ear Ache, Nasal Congestion, or Post Nasal Drip,  Cardio-vascular:  No Chest pain, Orthopnea, PND, Edema in lower extremities, Anasarca, Dizziness, Palpitations  Resp: No Dyspnea, No DOE, No Productive Cough, No Non-Productive Cough, No Hemoptysis, No Change in Color of Mucus,  No Wheezing.    GI: No Heartburn, Indigestion, +Abdominal Pain, +Nausea, +Vomiting, Diarrhea, Change in Bowel Habits,  Loss of Appetite  GU: No Dysuria, Change in Color of Urine, No Urgency or Frequency.  No flank pain.  Musculoskeletal: No Joint Pain or Swelling.  No Decreased Range of Motion. No Back Pain.  Neurologic: No Syncope, No Seizures, Muscle Weakness, Paresthesia, Vision Disturbance or Loss, No Diplopia, No Vertigo, No Difficulty Walking,  Skin: No Rash or Lesions. Psych: No Change in Mood or Affect. No Depression or Anxiety. No Memory loss. No Confusion or Hallucinations   Past Medical History  Diagnosis Date  . Rheumatoid arthritis   . Gastroparesis   . Degenerative disc disease    . Fibromyalgia   . Diverticulitis   . Thyroid disease   . Lupus   . Porphyria     Past Surgical History  Procedure Laterality Date  . Cesarean section    . Replacement total knee Bilateral   . Abdominal hysterectomy    . Cholecystectomy       Prior to Admission medications   Medication Sig Start Date End Date Taking? Authorizing Provider  cephALEXin (KEFLEX) 500 MG capsule Take 1 capsule (500 mg total) by mouth 4 (four) times daily. 07/23/13  Yes Veryl Speak, MD  gabapentin (NEURONTIN) 600 MG tablet Take 600 mg by mouth 3 (three) times daily.   Yes Historical Provider, MD  levothyroxine (SYNTHROID, LEVOTHROID) 25 MCG tablet Take 25 mcg by mouth daily before breakfast. Take with 362mcg to = 314mcg total   Yes Historical Provider, MD  levothyroxine (SYNTHROID, LEVOTHROID) 300 MCG tablet Take 300 mcg by mouth daily before breakfast. Take with a 39mcg tablet to = 318mcg   Yes Historical Provider, MD  omeprazole (PRILOSEC) 20 MG capsule Take 1 capsule (20 mg total) by mouth 2 (two) times daily before a meal. 03/19/13  Yes Lauren Burnetta Sabin, PA-C  ondansetron (ZOFRAN ODT) 4 MG disintegrating tablet Take 1 tablet (4 mg total) by mouth every 8 (eight) hours as needed for nausea or vomiting. 03/19/13  Yes Lauren Burnetta Sabin, PA-C  oxyCODONE (OXY IR/ROXICODONE) 5 MG immediate release tablet Take 5 mg by mouth every 4 (four) hours as needed for severe pain.   Yes Historical Provider, MD  oxyCODONE-acetaminophen (  PERCOCET) 5-325 MG per tablet Take 2 tablets by mouth every 4 (four) hours as needed. 07/23/13  Yes Veryl Speak, MD  tizanidine (ZANAFLEX) 2 MG capsule Take 2-4 mg by mouth 3 (three) times daily as needed for muscle spasms.  09/29/12  Yes Historical Provider, MD  metoCLOPramide (REGLAN) 10 MG tablet Take 1 tablet (10 mg total) by mouth 4 (four) times daily. 05/03/13   Carlisle Cater, PA-C    Allergies  Allergen Reactions  . Methotrexate Derivatives Other (See Comments)    headaches  . Morphine  And Related Hives and Itching  . Relafen [Nabumetone] Other (See Comments)    headaches    Social History:  reports that she has never smoked. She does not have any smokeless tobacco history on file. She reports that she drinks alcohol. She reports that she does not use illicit drugs.     Family History  Problem Relation Age of Onset  . Leukemia Mother        Physical Exam:  GEN:  Tearful Obese 48 y.o. Caucasian female examined and in no acute distress; cooperative with exam Filed Vitals:   07/25/13 2233 07/25/13 2300 07/26/13 0014 07/26/13 0148  BP: 117/90 124/82 122/77 128/84  Pulse: 63 69 75 76  Temp:      TempSrc:      Resp: 10 14 16 22   Height:      Weight:      SpO2: 100% 100% 100% 100%   Blood pressure 128/84, pulse 76, temperature 98.1 F (36.7 C), temperature source Oral, resp. rate 22, height 5\' 1"  (1.549 m), weight 83.008 kg (183 lb), SpO2 100.00%. PSYCH: She is alert and oriented x4; does not appear anxious does not appear depressed; affect is normal HEENT: Normocephalic and Atraumatic, Mucous membranes pink; PERRLA; EOM intact; Fundi:  Benign;  No scleral icterus, Nares: Patent, Oropharynx: Clear, Fair Dentition, Neck:  FROM, no cervical lymphadenopathy nor thyromegaly or carotid bruit; no JVD; Breasts:: Not examined CHEST WALL: No tenderness CHEST: Normal respiration, clear to auscultation bilaterally HEART: Regular rate and rhythm; no murmurs rubs or gallops BACK: No kyphosis or scoliosis; no CVA tenderness ABDOMEN: Positive Bowel Sounds, Obese, soft  but Mildly tender diffuse non-tender; no masses, no organomegaly. Rectal Exam: Not done EXTREMITIES: No cyanosis, clubbing or edema; no ulcerations. Genitalia: not examined PULSES: 2+ and symmetric SKIN: Normal hydration no rash or ulceration CNS:  Alert and Oriented X 4,  No Focal Deficits.   Vascular: pulses palpable throughout    Labs on Admission:  Basic Metabolic Panel:  Recent Labs Lab  07/23/13 0330 07/25/13 2059  NA 138 141  K 4.1 3.5*  CL 95* 103  CO2 24 22  GLUCOSE 104* 104*  BUN 15 12  CREATININE 1.19* 1.09  CALCIUM 10.1 9.2   Liver Function Tests:  Recent Labs Lab 07/23/13 0330 07/25/13 2059  AST 23 15  ALT 23 13  ALKPHOS 129* 89  BILITOT 0.6 0.4  PROT 8.9* 7.4  ALBUMIN 4.6 3.9    Recent Labs Lab 07/25/13 2059  LIPASE 15   No results found for this basename: AMMONIA,  in the last 168 hours CBC:  Recent Labs Lab 07/23/13 0330 07/25/13 2059  WBC 7.0 5.3  NEUTROABS 5.1 3.1  HGB 12.7 11.5*  HCT 38.9 35.7*  MCV 94.0 96.0  PLT 243 208   Cardiac Enzymes: No results found for this basename: CKTOTAL, CKMB, CKMBINDEX, TROPONINI,  in the last 168 hours  BNP (last 3 results) No results  found for this basename: PROBNP,  in the last 8760 hours CBG: No results found for this basename: GLUCAP,  in the last 168 hours  Radiological Exams on Admission: No results found.    Assessment/Plan:   48 y.o. female with  Active Problems:   Abdominal pain   Unspecified hypothyroidism   AKI (acute kidney injury)   Chronic abdominal pain   Nausea and vomiting   Porphyria   Rheumatoid arthritis   UTI (lower urinary tract infection)     1.   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     2.   Nausea and Vomiting-  Due to Porphyria Flare or Gastroparesis.   Supportive Rs, and IV Anti-emetics PRN.   Clear liquid Diet as tolerated, advance as tolerated to Regular.    3.   AKI- due to N+V,  IVFs for Rehydration.    4.   Chronic ABD Pain - See #1.    5.   RA- Stable  Since Bilateral Knee replacements.    6.   UTI- partially treated,  Urine C+S.   IV rocephin while having difficulty taking PO.    7.  DVT Prophylaxis with Lovenox.       Code Status:  FULL CODE Family Communication:    Husband at Bedside Disposition Plan:       Observation  Time  spent:  [facility] Pager [phone]  If 7PM-7AM, please contact night-coverage [email or web address] Password [email or web address] 07/26/2013, 2:43 AM

## 2013-07-26 NOTE — ED Notes (Signed)
Transporting patient to new room assignment. 

## 2013-07-27 LAB — URINE CULTURE
CULTURE: NO GROWTH
Colony Count: NO GROWTH

## 2013-07-27 MED ORDER — CEFPODOXIME PROXETIL 200 MG PO TABS
200.0000 mg | ORAL_TABLET | Freq: Two times a day (BID) | ORAL | Status: DC
  Administered 2013-07-28: 200 mg via ORAL
  Filled 2013-07-27 (×2): qty 1

## 2013-07-27 NOTE — Discharge Summary (Addendum)
Physician Discharge Summary  Adrienne Flores:[id number] DOB: 1965/04/27 DOA: 07/25/2013  PCP: Adrienne Pilar, MD  Admit date: 07/25/2013 Discharge date: 07/28/13  Recommendations for Outpatient Follow-up:  1. Pt will need to follow up with PCP in 2 weeks post discharge 2. Please obtain BMP to evaluate electrolytes and kidney function 3. Please also check CBC to evaluate Hg and Hct levels  Discharge Diagnoses:  Abdominal pain with diarrhea secondary to irritable bowel syndrome and functional bowel disease  -Abdominal pain improving, no diarrhea since admission  -patient has chronic abdominal pain is on chronic opioids with oxycodone at home  -she may have a degree of opioid withdraw when she runs out of opioids  -It appears she gets opioids from numerous ED visits  -she has IV opioid seeking behavior -pt's emotional lability is playing a role into her abdominal pain -Unclear if the patient's hypothyroidism may be playing a role in her abdominal pain  -Celiac 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 consult--the patient has functional bowel disease with IBS as the main source of her GI symptoms. He did not recommend any further workup at this time.  -He ordered transglutaminase antibodies--negative  -Unfortunately, the patient continued to have many excuses for not following up with her PCP and missing her appointment with outpatient gastroenterology with Dr. Penelope Flores -She has history of numerous emergency department visits to multiple facilities from which she obtains oral opioids. -Previous workup for porphyria (24hr urine porphyrobilinogen and urine porphyrins) including acute intermittent porphyria has been unremarkable as noted in October 2014 and May 2015  -Obtain stool for C. difficile PCR--no diarrhea since admission; therefore no stool was obtained  -Stool pathogen panel--no diarrhea since admission; therefore no stool was obtained  -07/25/13  Urinalysis negative for pyuria but will finish abx course from previously diagnosed UTI--ceftriaxone  -Jan 2014 colonoscopy at [facility] unremarkable  -The patient's diet was advanced which she tolerated  Hypothyroidism  -Suspect poor compliance with levothyroxine as patient states that she frequently misses her doses when she feels "bad"  -Continue levothyroxine by mouth for now if patient is able to tolerate by mouth  CKD stage III  -Baseline creatinine 1.0-1.2  -Continue IV fluids  -Serum creatinine improved  Uncontrolled pain  -I discussed with the patient that it is unrealistic to make her pain 0  -Continue oral opioids  -IV opioids for severe uncontrolled pain when necessary  -patient had IV opioid seeking behavior. when distracted, the patient's pain was well-controlled  Hyperlipidemia  -LDL 201  -pt would benefit from statin-pt will f/u with PCP for followup  UTI -The patient was diagnosed with a urinary tract infection on 07/23/2013 during one of her emergency department visits. -Urinalysis at that time revealed TNTC WBC, pt was sent home with cephalexin -Her urine culture at that time-Showed only 55,000 colonies of multiple bacterial morphotypes -during this admission, cephalexin was d/ced and pt started on ceftriaxone -Visual be discharged home with 4 additional days of cefpodoxime which will complete 7 days of therapy  Discharge Condition: stable  Disposition: home  Diet:soft Wt Readings from Last 3 Encounters:  07/25/13 83.008 kg (183 lb)  07/23/13 83.008 kg (183 lb)  06/27/13 84.868 kg (187 lb 1.6 oz)    History of present illness:  48 y.o. female with a PMH of chronic abdominal pain, IBS, gastroparesis, RA, DDD, fibromyalgia, diverticulitis, hypothyroidism who presents to the ED for evaluation of abdominal pain and emesis. Patient states she has had constant lower abdominal  pain in the lower abdomen diffusely for years. Her pain has been worse the past few days prior to  admission. Her pain is described as a constant stabbing pain which is worse with eating or drinking. Patient seen in the ED on 07/23/13 and was prescribed Keflex and oxycodone which she has been taking. She has not follow-up with anyone after discharge. Patient states she took her Reglan at home but has also has had nausea and vomiting. Previous abdominal surgeries include a hysterectomy and cholecystectomy.  The patient was recently discharged from [facility] on 06/28/2013 for very similar complaints and presentation. The patient has a significant emotional lability and emotional overlay which is affecting her abdominal discomfort. The patient has intravenous opioid seeking behavior. When the patient is distracted, the patient has minimal to no abdominal discomfort. In fact, went intravenous opioids were weaned and ultimately discontinued, the patient went to go home. The patient continues to have numerous excuses for her outpatient followup with her primary care provider as well as with gastroenterology. She has history of numerous emergency department visits to multiple facilities from which she obtains oral opioids. Nevertheless, the patient was treated conservatively. She was initially made n.p.o. and her diet was gradually advanced. She was maintained on intravenous fluids. Once the patient was able tolerate a soft diet, she was discharged in stable condition.     Discharge Exam: Filed Vitals:   07/28/13 0517  BP: 90/46  Pulse: 71  Temp: 98.3 F (36.8 C)  Resp: 16   Filed Vitals:   07/27/13 1745 07/27/13 2134 07/28/13 0147 07/28/13 0517  BP: 140/91 110/69 130/88 90/46  Pulse: 86 74 69 71  Temp: 98.2 F (36.8 C) 97.7 F (36.5 C) 98.1 F (36.7 C) 98.3 F (36.8 C)  TempSrc: Oral Oral Oral Oral  Resp: 18 16 15 16   Height:      Weight:      SpO2: 100% 100% 100% 98%   General: A&O x 3, NAD, pleasant, cooperative Cardiovascular: RRR, no rub, no gallop, no S3 Respiratory: CTAB, no wheeze, no  rhonchi Abdomen:soft, nontender, nondistended, positive bowel sounds Extremities: trace LE edema, No lymphangitis, no petechiae  Discharge Instructions     Medication List    STOP taking these medications       cephALEXin 500 MG capsule  Commonly known as:  KEFLEX      TAKE these medications       cefpodoxime 200 MG tablet  Commonly known as:  VANTIN  Take 1 tablet (200 mg total) by mouth every 12 (twelve) hours.     gabapentin 600 MG tablet  Commonly known as:  NEURONTIN  Take 600 mg by mouth 3 (three) times daily.     levothyroxine 300 MCG tablet  Commonly known as:  SYNTHROID, LEVOTHROID  Take 300 mcg by mouth daily before breakfast. Take with a 68mcg tablet to = 366mcg     levothyroxine 25 MCG tablet  Commonly known as:  SYNTHROID, LEVOTHROID  Take 25 mcg by mouth daily before breakfast. Take with 360mcg to = 359mcg total     metoCLOPramide 10 MG tablet  Commonly known as:  REGLAN  Take 1 tablet (10 mg total) by mouth 4 (four) times daily.     omeprazole 20 MG capsule  Commonly known as:  PRILOSEC  Take 1 capsule (20 mg total) by mouth 2 (two) times daily before a meal.     ondansetron 4 MG disintegrating tablet  Commonly known as:  ZOFRAN ODT  Take 1 tablet (4 mg total) by mouth every 8 (eight) hours as needed for nausea or vomiting.     oxyCODONE 5 MG immediate release tablet  Commonly known as:  Oxy IR/ROXICODONE  Take 5 mg by mouth every 4 (four) hours as needed for severe pain.     oxyCODONE-acetaminophen 5-325 MG per tablet  Commonly known as:  PERCOCET  Take 2 tablets by mouth every 4 (four) hours as needed.     tizanidine 2 MG capsule  Commonly known as:  ZANAFLEX  Take 2-4 mg by mouth 3 (three) times daily as needed for muscle spasms.         The results of significant diagnostics from this hospitalization (including imaging, microbiology, ancillary and laboratory) are listed below for reference.    Significant Diagnostic Studies: No  results found.   Microbiology: Recent Results (from the past 240 hour(s))  URINE CULTURE     Status: None   Collection Time    07/23/13  6:00 AM      Result Value Ref Range Status   Specimen Description URINE, CLEAN CATCH   Final   Special Requests Normal   Final   Culture  Setup Time     Final   Value: 07/23/2013 13:17     Performed at [place]     Final   Value: 55,000 COLONIES/ML     Performed at [facility]   Culture     Final   Value: Multiple bacterial morphotypes present, none predominant. Suggest appropriate recollection if clinically indicated.     Performed at [facility]   Report Status 07/24/2013 FINAL   Final  URINE CULTURE     Status: None   Collection Time    07/26/13  4:33 AM      Result Value Ref Range Status   Specimen Description URINE, CLEAN CATCH   Final   Special Requests NONE   Final   Culture  Setup Time     Final   Value: 07/26/2013 14:15     Performed at [place]     Final   Value: NO GROWTH     Performed at [facility]   Culture     Final   Value: NO GROWTH     Performed at [facility]   Report Status 07/27/2013 FINAL   Final     Labs: Basic Metabolic Panel:  Recent Labs Lab 07/23/13 0330 07/25/13 2059 07/26/13 0455 07/28/13 0425  NA 138 141 146 139  K 4.1 3.5* 3.7 3.6*  CL 95* 103 108 100  CO2 24 22 22 22   GLUCOSE 104* 104* 87 109*  BUN 15 12 9  5*  CREATININE 1.19* 1.09 1.01 0.85  CALCIUM 10.1 9.2 8.6 9.1   Liver Function Tests:  Recent Labs Lab 07/23/13 0330 07/25/13 2059  AST 23 15  ALT 23 13  ALKPHOS 129* 89  BILITOT 0.6 0.4  PROT 8.9* 7.4  ALBUMIN 4.6 3.9    Recent Labs Lab 07/25/13 2059  LIPASE 15   No results found for this basename: AMMONIA,  in the last 168 hours CBC:  Recent Labs Lab 07/23/13 0330 07/25/13 2059 07/26/13 0455 07/28/13 0425  WBC 7.0 5.3 4.6 4.5  NEUTROABS 5.1 3.1  --   --   HGB 12.7 11.5*  10.1* 10.7*  HCT 38.9 35.7* 31.0* 32.5*  MCV 94.0 96.0 93.7 92.9  PLT 243 208  203 208   Cardiac Enzymes: No results found for this basename: CKTOTAL, CKMB, CKMBINDEX, TROPONINI,  in the last 168 hours BNP: No components found with this basename: POCBNP,  CBG: No results found for this basename: GLUCAP,  in the last 168 hours  Time coordinating discharge:  Greater than 30 minutes  Signed:  Alize Borrayo, DO [facility] Pager: [phone] 07/28/2013, 9:39 AM

## 2013-07-27 NOTE — Progress Notes (Signed)
PROGRESS NOTE  Adrienne Flores LOV:[id number] DOB: 1965-08-07 DOA: 07/25/2013 PCP: [name], MD  Interim summary 48 y.o. female with a PMH of chronic abdominal pain, IBS, gastroparesis, RA, DDD, fibromyalgia, diverticulitis, hypothyroidism who presents to the ED for evaluation of abdominal pain and emesis.  Patient states she has had constant lower abdominal pain in the lower abdomen diffusely for years. Her pain has been worse the past few days prior to admission. Her pain is described as a constant stabbing pain which is worse with eating or drinking. Patient seen in the ED  on 07/23/13 and was prescribed Keflex and oxycodone which she has been taking. She has not follow-up with anyone after discharge. Patient states she took her Reglan at home but has also has had nausea and vomiting.  Previous abdominal surgeries include a hysterectomy and cholecystectomy.   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 consult--the patient has functional bowel disease with IBS as the main source of her GI symptoms. He did not recommend any further workup at this time.  -He ordered transglutaminase antibodies--negative  -Previous workup for porphyria (24hr urine porphyrobilinogen and urine porphyrins) including acute intermittent porphyria has been unremarkable as noted in October 2014 and May 2015  -Obtain stool for C. difficile PCR--no diarrhea since admission; therefore no stool was obtained  -Stool pathogen panel--no diarrhea since admission; therefore no  stool was obtained  -07/25/13 Urinalysis negative for pyuria but will finish abx course from previously diagnosed UTI--ceftriaxone  -Jan 2014 colonoscopy at [facility] unremarkable  -The patient's diet was advanced which she tolerated  Hypothyroidism  -Suspect poor compliance with levothyroxine as patient states that she frequently misses her doses when she feels "bad"  -Continue levothyroxine by mouth for now if patient is able to tolerate by mouth  CKD stage III  -Baseline creatinine 1.0-1.2  -Continue IV fluids  -Serum creatinine improved  Uncontrolled pain  -I discussed with the patient that it is unrealistic to make her pain 0  -Continue oral opioids  -IV opioids for severe uncontrolled pain when necessary  -patient had IV opioid seeking behavior. when distracted, the patient's pain was well-controlled  Hyperlipidemia  -LDL 201  -pt would benefit from statin-pt will f/u with PCP for followup  Family Communication: husband at beside  Disposition Plan: Home 07/28/13 if medically stable       Subjective: Patient continues abdominal pain. She complains of some constipation. At home she has alternating bouts of constipation and loose stools. Denies any fevers, chills, chest pain, shortness breath, hematochezia, melena, dysuria, hematuria.  Objective: Filed Vitals:   07/26/13 2128 07/27/13 0211 07/27/13 0537 07/27/13 0926  BP: 116/79 123/75 144/85 135/76  Pulse: 71 86 79 68  Temp: 98.4 F (36.9 C) 98.3 F (36.8 C) 98.4 F (36.9 C) 98.4 F (36.9 C)  TempSrc: Oral Oral Oral Oral  Resp: 17 16 16 16   Height:      Weight:      SpO2: 99% 100% 100% 99%    Intake/Output Summary (Last 24 hours)  at 07/27/13 1237 Last data filed at 07/27/13 0538  Gross per 24 hour  Intake 1483.33 ml  Output   1025 ml  Net 458.33 ml   Weight change:  Exam:   General:  Pt is alert, follows commands appropriately, not in acute distress  HEENT: No icterus, No thrush, [place]/AT  Cardiovascular: RRR,  S1/S2, no rubs, no gallops  Respiratory: CTA bilaterally, no wheezing, no crackles, no rhonchi  Abdomen: Soft/+BS, non tender when distracted, non distended, no guarding  Extremities: No edema, No lymphangitis, No petechiae, No rashes, no synovitis  Data Reviewed: Basic Metabolic Panel:  Recent Labs Lab 07/23/13 0330 07/25/13 2059 07/26/13 0455  NA 138 141 146  K 4.1 3.5* 3.7  CL 95* 103 108  CO2 24 22 22   GLUCOSE 104* 104* 87  BUN 15 12 9   CREATININE 1.19* 1.09 1.01  CALCIUM 10.1 9.2 8.6   Liver Function Tests:  Recent Labs Lab 07/23/13 0330 07/25/13 2059  AST 23 15  ALT 23 13  ALKPHOS 129* 89  BILITOT 0.6 0.4  PROT 8.9* 7.4  ALBUMIN 4.6 3.9    Recent Labs Lab 07/25/13 2059  LIPASE 15   No results found for this basename: AMMONIA,  in the last 168 hours CBC:  Recent Labs Lab 07/23/13 0330 07/25/13 2059 07/26/13 0455  WBC 7.0 5.3 4.6  NEUTROABS 5.1 3.1  --   HGB 12.7 11.5* 10.1*  HCT 38.9 35.7* 31.0*  MCV 94.0 96.0 93.7  PLT 243 208 203   Cardiac Enzymes: No results found for this basename: CKTOTAL, CKMB, CKMBINDEX, TROPONINI,  in the last 168 hours BNP: No components found with this basename: POCBNP,  CBG: No results found for this basename: GLUCAP,  in the last 168 hours  Recent Results (from the past 240 hour(s))  URINE CULTURE     Status: None   Collection Time    07/23/13  6:00 AM      Result Value Ref Range Status   Specimen Description URINE, CLEAN CATCH   Final   Special Requests Normal   Final   Culture  Setup Time     Final   Value: 07/23/2013 13:17     Performed at [place]     Final   Value: 55,000 COLONIES/ML     Performed at [facility]   Culture     Final   Value: Multiple bacterial morphotypes present, none predominant. Suggest appropriate recollection if clinically indicated.     Performed at [facility]   Report Status 07/24/2013 FINAL   Final     Scheduled Meds: .  cefTRIAXone (ROCEPHIN)  IV  1 g Intravenous Q24H  . enoxaparin (LOVENOX) injection  40 mg Subcutaneous Daily  . fentaNYL  25 mcg Intravenous Once  . gabapentin  600 mg Oral TID  . levothyroxine  325 mcg Oral QAC breakfast   Continuous Infusions: . dextrose 5 % and 0.45% NaCl 1,000 mL with potassium chloride 20 mEq infusion 75 mL/hr at 07/27/13 0359     Harolyn Cocker, DO  [facility] Pager [phone]  If 7PM-7AM, please contact night-coverage [email or web address] Password [facility] 07/27/2013, 12:37 PM   LOS: 2 days

## 2013-07-27 NOTE — Progress Notes (Signed)
UR Completed.  Adrienne Flores Jane [phone] 07/27/2013  

## 2013-07-28 LAB — BASIC METABOLIC PANEL
BUN: 5 mg/dL — ABNORMAL LOW (ref 6–23)
CALCIUM: 9.1 mg/dL (ref 8.4–10.5)
CO2: 22 mEq/L (ref 19–32)
CREATININE: 0.85 mg/dL (ref 0.50–1.10)
Chloride: 100 mEq/L (ref 96–112)
GFR calc Af Amer: >90 mL/min (ref >90)
GFR calc non Af Amer: 79 mL/min — ABNORMAL LOW (ref >90)
Glucose, Bld: 109 mg/dL — ABNORMAL HIGH (ref 70–99)
Potassium: 3.6 mEq/L — ABNORMAL LOW (ref 3.7–5.3)
Sodium: 139 mEq/L (ref 137–147)

## 2013-07-28 LAB — CBC
HCT: 32.5 % — ABNORMAL LOW (ref 36.0–46.0)
Hemoglobin: 10.7 g/dL — ABNORMAL LOW (ref 12.0–15.0)
MCH: 30.6 pg (ref 26.0–34.0)
MCHC: 32.9 g/dL (ref 30.0–36.0)
MCV: 92.9 fL (ref 78.0–100.0)
PLATELETS: 208 10*3/uL (ref 150–400)
RBC: 3.5 MIL/uL — ABNORMAL LOW (ref 3.87–5.11)
RDW: 14 % (ref 11.5–15.5)
WBC: 4.5 10*3/uL (ref 4.0–10.5)

## 2013-07-28 MED ORDER — CEFPODOXIME PROXETIL 200 MG PO TABS: 200.0000 mg | ORAL_TABLET | Freq: Two times a day (BID) | ORAL | Status: DC

## 2013-07-28 MED ORDER — OXYCODONE HCL 5 MG PO TABS
10.0000 mg | ORAL_TABLET | ORAL | Status: DC | PRN
  Administered 2013-07-28: 10 mg via ORAL
  Filled 2013-07-28: qty 2

## 2013-07-28 MED ORDER — HEPARIN SOD (PORK) LOCK FLUSH 100 UNIT/ML IV SOLN
500.0000 [IU] | INTRAVENOUS | Status: AC | PRN
  Administered 2013-07-28: 500 [IU]

## 2013-07-28 NOTE — Progress Notes (Signed)
Discussed discharge summary with pt. Pt stable and did not have any questions. Pt ready for discharge.

## 2013-07-29 ENCOUNTER — Ambulatory Visit: Payer: Self-pay | Admitting: Pain Medicine

## 2013-07-30 NOTE — ED Provider Notes (Signed)
Medical screening examination/treatment/procedure(s) were performed by non-physician practitioner and as supervising physician I was immediately available for consultation/collaboration.   EKG Interpretation None        Wandra Arthurs, MD 07/30/13 2255

## 2013-08-10 ENCOUNTER — Emergency Department (HOSPITAL_COMMUNITY)
Admission: EM | Admit: 2013-08-10 | Discharge: 2013-08-10 | Disposition: A | Discharge status: Home / Self Care | Payer: Medicare Other | Attending: Emergency Medicine | Admitting: Emergency Medicine

## 2013-08-10 ENCOUNTER — Emergency Department (HOSPITAL_COMMUNITY): Payer: Medicare Other

## 2013-08-10 ENCOUNTER — Encounter (HOSPITAL_COMMUNITY): Payer: Self-pay | Admitting: Emergency Medicine

## 2013-08-10 DIAGNOSIS — R112 Nausea with vomiting, unspecified: Secondary | ICD-10-CM | POA: Insufficient documentation

## 2013-08-10 DIAGNOSIS — Z8639 Personal history of other endocrine, nutritional and metabolic disease: Secondary | ICD-10-CM | POA: Insufficient documentation

## 2013-08-10 DIAGNOSIS — R109 Unspecified abdominal pain: Secondary | ICD-10-CM

## 2013-08-10 DIAGNOSIS — G8929 Other chronic pain: Secondary | ICD-10-CM

## 2013-08-10 DIAGNOSIS — R5381 Other malaise: Secondary | ICD-10-CM | POA: Insufficient documentation

## 2013-08-10 DIAGNOSIS — Z79899 Other long term (current) drug therapy: Secondary | ICD-10-CM | POA: Insufficient documentation

## 2013-08-10 DIAGNOSIS — E079 Disorder of thyroid, unspecified: Secondary | ICD-10-CM | POA: Insufficient documentation

## 2013-08-10 DIAGNOSIS — R1032 Left lower quadrant pain: Secondary | ICD-10-CM | POA: Insufficient documentation

## 2013-08-10 DIAGNOSIS — R5383 Other fatigue: Secondary | ICD-10-CM

## 2013-08-10 DIAGNOSIS — Z90710 Acquired absence of both cervix and uterus: Secondary | ICD-10-CM | POA: Insufficient documentation

## 2013-08-10 DIAGNOSIS — R52 Pain, unspecified: Secondary | ICD-10-CM

## 2013-08-10 DIAGNOSIS — Z792 Long term (current) use of antibiotics: Secondary | ICD-10-CM | POA: Insufficient documentation

## 2013-08-10 DIAGNOSIS — Z862 Personal history of diseases of the blood and blood-forming organs and certain disorders involving the immune mechanism: Secondary | ICD-10-CM | POA: Insufficient documentation

## 2013-08-10 DIAGNOSIS — Z8719 Personal history of other diseases of the digestive system: Secondary | ICD-10-CM | POA: Insufficient documentation

## 2013-08-10 LAB — COMPREHENSIVE METABOLIC PANEL
ALK PHOS: 133 U/L — AB (ref 39–117)
ALT: 22 U/L (ref 0–35)
AST: 25 U/L (ref 0–37)
Albumin: 4.4 g/dL (ref 3.5–5.2)
BUN: 12 mg/dL (ref 6–23)
CO2: 24 mEq/L (ref 19–32)
Calcium: 10.1 mg/dL (ref 8.4–10.5)
Chloride: 99 mEq/L (ref 96–112)
Creatinine, Ser: 0.96 mg/dL (ref 0.50–1.10)
GFR calc non Af Amer: 68 mL/min — ABNORMAL LOW (ref >90)
GFR, EST AFRICAN AMERICAN: 79 mL/min — AB (ref >90)
GLUCOSE: 95 mg/dL (ref 70–99)
Potassium: 4.2 mEq/L (ref 3.7–5.3)
Sodium: 141 mEq/L (ref 137–147)
Total Bilirubin: 0.5 mg/dL (ref 0.3–1.2)
Total Protein: 8.8 g/dL — ABNORMAL HIGH (ref 6.0–8.3)

## 2013-08-10 LAB — CBC WITH DIFFERENTIAL/PLATELET
BASOS ABS: 0.1 10*3/uL (ref 0.0–0.1)
BASOS PCT: 1 % (ref 0–1)
EOS ABS: 0.1 10*3/uL (ref 0.0–0.7)
Eosinophils Relative: 2 % (ref 0–5)
HCT: 42.8 % (ref 36.0–46.0)
Hemoglobin: 14.1 g/dL (ref 12.0–15.0)
Lymphocytes Relative: 30 % (ref 12–46)
Lymphs Abs: 1.5 10*3/uL (ref 0.7–4.0)
MCH: 29.9 pg (ref 26.0–34.0)
MCHC: 32.9 g/dL (ref 30.0–36.0)
MCV: 90.9 fL (ref 78.0–100.0)
MONO ABS: 0.4 10*3/uL (ref 0.1–1.0)
Monocytes Relative: 8 % (ref 3–12)
NEUTROS ABS: 3 10*3/uL (ref 1.7–7.7)
Neutrophils Relative %: 59 % (ref 43–77)
Platelets: 256 10*3/uL (ref 150–400)
RBC: 4.71 MIL/uL (ref 3.87–5.11)
RDW: 13.4 % (ref 11.5–15.5)
WBC: 5 10*3/uL (ref 4.0–10.5)

## 2013-08-10 LAB — URINALYSIS, ROUTINE W REFLEX MICROSCOPIC
BILIRUBIN URINE: NEGATIVE
Glucose, UA: NEGATIVE mg/dL
HGB URINE DIPSTICK: NEGATIVE
Ketones, ur: NEGATIVE mg/dL
Nitrite: NEGATIVE
Protein, ur: NEGATIVE mg/dL
Specific Gravity, Urine: 1.018 (ref 1.005–1.030)
UROBILINOGEN UA: 0.2 mg/dL (ref 0.0–1.0)
pH: 6 (ref 5.0–8.0)

## 2013-08-10 LAB — LIPASE, BLOOD: Lipase: 12 U/L (ref 11–59)

## 2013-08-10 LAB — URINE MICROSCOPIC-ADD ON

## 2013-08-10 MED ORDER — HYDROMORPHONE HCL PF 1 MG/ML IJ SOLN
1.0000 mg | Freq: Once | INTRAMUSCULAR | Status: AC
  Administered 2013-08-10: 1 mg via INTRAVENOUS
  Filled 2013-08-10: qty 1

## 2013-08-10 MED ORDER — IOHEXOL 300 MG/ML  SOLN
80.0000 mL | Freq: Once | INTRAMUSCULAR | Status: AC | PRN
  Administered 2013-08-10: 80 mL via INTRAVENOUS

## 2013-08-10 MED ORDER — IOHEXOL 300 MG/ML  SOLN
25.0000 mL | Freq: Once | INTRAMUSCULAR | Status: DC | PRN
Start: 2013-08-10 — End: 2013-08-10

## 2013-08-10 MED ORDER — SODIUM CHLORIDE 0.9 % IV BOLUS (SEPSIS)
1000.0000 mL | Freq: Once | INTRAVENOUS | Status: AC
  Administered 2013-08-10: 1000 mL via INTRAVENOUS

## 2013-08-10 MED ORDER — ACETAMINOPHEN 325 MG PO TABS
650.0000 mg | ORAL_TABLET | Freq: Once | ORAL | Status: AC
  Administered 2013-08-10: 650 mg via ORAL
  Filled 2013-08-10: qty 2

## 2013-08-10 MED ORDER — SODIUM CHLORIDE 0.9 % IV SOLN
Freq: Once | INTRAVENOUS | Status: AC
  Administered 2013-08-10: 08:00:00 via INTRAVENOUS

## 2013-08-10 MED ORDER — HEPARIN SOD (PORK) LOCK FLUSH 100 UNIT/ML IV SOLN
500.0000 [IU] | INTRAVENOUS | Status: AC | PRN
  Administered 2013-08-10: 500 [IU]

## 2013-08-10 MED ORDER — ONDANSETRON HCL 4 MG/2ML IJ SOLN
4.0000 mg | Freq: Once | INTRAMUSCULAR | Status: AC
  Administered 2013-08-10: 4 mg via INTRAVENOUS
  Filled 2013-08-10: qty 2

## 2013-08-10 NOTE — ED Notes (Signed)
Dr. Bartholome Bill at bedside.

## 2013-08-10 NOTE — ED Notes (Signed)
IV Team paged/phoned to de access IV [place].

## 2013-08-10 NOTE — ED Provider Notes (Signed)
CSN: [id number]     Arrival date & time 08/10/13  0444 History   First MD Initiated Contact with Patient 08/10/13 [phone]     Chief Complaint  Patient presents with  . Abdominal Pain     (Consider location/radiation/quality/duration/timing/severity/associated sxs/prior Treatment) HPI Comments: 48 year-old female with history of colitis, diverticulitis, chronic abdominal pain, rheumatoid arthritis non-prednisone, urine infection presents with left lower quadrant abdominal pain, severe and mild radiation the back. Patient had similar in the past it is more severe. Patient has had vomiting and diarrhea and worsening pain since early this morning. No fevers. No blood in the stools.  Patient is a 48 y.o. female presenting with abdominal pain. The history is provided by the patient.  Abdominal Pain Associated symptoms: fatigue, nausea and vomiting   Associated symptoms: no chest pain, no chills, no dysuria, no fever and no shortness of breath     Past Medical History  Diagnosis Date  . Rheumatoid arthritis   . Gastroparesis   . Degenerative disc disease   . Fibromyalgia   . Diverticulitis   . Thyroid disease   . Lupus   . Porphyria    Past Surgical History  Procedure Laterality Date  . Cesarean section    . Replacement total knee Bilateral   . Abdominal hysterectomy    . Cholecystectomy     Family History  Problem Relation Age of Onset  . Leukemia Mother    History  Substance Use Topics  . Smoking status: Never Smoker   . Smokeless tobacco: Not on file  . Alcohol Use: Yes     Comment: social   OB History   Grav Para Term Preterm Abortions TAB SAB Ect Mult Living                 Review of Systems  Constitutional: Positive for appetite change and fatigue. Negative for fever and chills.  HENT: Negative for congestion.   Eyes: Negative for visual disturbance.  Respiratory: Negative for shortness of breath.   Cardiovascular: Negative for chest pain.  Gastrointestinal:  Positive for nausea, vomiting and abdominal pain. Negative for blood in stool.  Genitourinary: Negative for dysuria and flank pain.  Musculoskeletal: Negative for back pain, neck pain and neck stiffness.  Skin: Negative for rash.  Neurological: Negative for light-headedness and headaches.      Allergies  Methotrexate derivatives; Morphine and related; and Relafen  Home Medications   Prior to Admission medications   Medication Sig Start Date End Date Taking? Authorizing Provider  cefpodoxime (VANTIN) 200 MG tablet Take 1 tablet (200 mg total) by mouth every 12 (twelve) hours. 07/28/13  Yes Orson Eva, MD  gabapentin (NEURONTIN) 600 MG tablet Take 600 mg by mouth 3 (three) times daily.   Yes Historical Provider, MD  levothyroxine (SYNTHROID, LEVOTHROID) 25 MCG tablet Take 25 mcg by mouth daily before breakfast. Take with 389mcg to = 360mcg total   Yes Historical Provider, MD  levothyroxine (SYNTHROID, LEVOTHROID) 300 MCG tablet Take 300 mcg by mouth daily before breakfast. Take with a 17mcg tablet to = 356mcg   Yes Historical Provider, MD  metoCLOPramide (REGLAN) 10 MG tablet Take 1 tablet (10 mg total) by mouth 4 (four) times daily. 05/03/13  Yes Carlisle Cater, PA-C  omeprazole (PRILOSEC) 20 MG capsule Take 1 capsule (20 mg total) by mouth 2 (two) times daily before a meal. 03/19/13  Yes Lauren Burnetta Sabin, PA-C  ondansetron (ZOFRAN ODT) 4 MG disintegrating tablet Take 1 tablet (4 mg total)  by mouth every 8 (eight) hours as needed for nausea or vomiting. 03/19/13  Yes Lauren Burnetta Sabin, PA-C  oxyCODONE (OXY IR/ROXICODONE) 5 MG immediate release tablet Take 5 mg by mouth every 4 (four) hours as needed for severe pain.   Yes Historical Provider, MD  tizanidine (ZANAFLEX) 2 MG capsule Take 2-4 mg by mouth 3 (three) times daily as needed for muscle spasms.  09/29/12  Yes Historical Provider, MD  oxyCODONE-acetaminophen (PERCOCET) 5-325 MG per tablet Take 2 tablets by mouth every 4 (four) hours as needed.  07/23/13   Veryl Speak, MD   BP 141/101  Pulse 93  Temp(Src) 97.9 F (36.6 C) (Oral)  Resp 14  Ht 5\' 1"  (1.549 m)  Wt 193 lb (87.544 kg)  BMI 36.49 kg/m2  SpO2 100% Physical Exam  Nursing note and vitals reviewed. Constitutional: She is oriented to person, place, and time. She appears well-developed and well-nourished.  HENT:  Head: Normocephalic and atraumatic.  Dry mucous membranes  Eyes: Conjunctivae are normal. Right eye exhibits no discharge. Left eye exhibits no discharge.  Neck: Normal range of motion. Neck supple. No tracheal deviation present.  Cardiovascular: Normal rate and regular rhythm.   Pulmonary/Chest: Effort normal and breath sounds normal.  Abdominal: Soft. She exhibits no distension. There is tenderness (left lower quadrant moderate). There is no guarding.  Musculoskeletal: She exhibits no edema.  Neurological: She is alert and oriented to person, place, and time.  Skin: Skin is warm. No rash noted.  Psychiatric: She has a normal mood and affect.    ED Course  Procedures (including critical care time) Labs Review Labs Reviewed  COMPREHENSIVE METABOLIC PANEL - Abnormal; Notable for the following:    Total Protein 8.8 (*)    Alkaline Phosphatase 133 (*)    GFR calc non Af Amer 68 (*)    GFR calc Af Amer 79 (*)    All other components within normal limits  CBC WITH DIFFERENTIAL  LIPASE, BLOOD  URINALYSIS, ROUTINE W REFLEX MICROSCOPIC    Imaging Review No results found.   EKG Interpretation None      MDM   Final diagnoses:  None   Patient with acute on chronic abdominal pain worse left lower quadrant. Discussed differential colitis, diverticulitis. Patient recent CT scan overall unremarkable reviewed. Patient comfortable repeat CT scan, changed without contrast as lower suspicion for any significant pathology except for inflammation.  Pain meds, IV fluids and nausea meds.  Patient signed out to ED provider to fup CT and dispo./   Medications   iohexol (OMNIPAQUE) 300 MG/ML solution 25 mL (not administered)  HYDROmorphone (DILAUDID) injection 1 mg (not administered)  HYDROmorphone (DILAUDID) injection 1 mg (1 mg Intravenous Given 08/10/13 0711)  ondansetron (ZOFRAN) injection 4 mg (4 mg Intravenous Given 08/10/13 0709)  sodium chloride 0.9 % bolus 1,000 mL (0 mLs Intravenous Stopped 08/10/13 0814)    Filed Vitals:   08/10/13 0452  BP: 141/101  Pulse: 93  Temp: 97.9 F (36.6 C)  TempSrc: Oral  Resp: 14  Height: 5\' 1"  (1.549 m)  Weight: 193 lb (87.544 kg)  SpO2: 100%    Abd pain, vomiting, diarrhea .  Mariea Clonts, MD 08/10/13 [phone]

## 2013-08-10 NOTE — ED Notes (Signed)
Pt. woke up this morning with low abdominal pain , emesis and diarrhea . Denies fever or chills.

## 2013-08-10 NOTE — ED Notes (Signed)
CT Claiborne Billings) notified that pt to have IV Contrast due to pt not tolerating PO fluids due to abdominal pains.

## 2013-08-10 NOTE — Discharge Instructions (Signed)
Abdominal Pain, Women °Abdominal (stomach, pelvic, or belly) pain can be caused by many things. It is important to tell your doctor: °· The location of the pain. °· Does it come and go or is it present all the time? °· Are there things that start the pain (eating certain foods, exercise)? °· Are there other symptoms associated with the pain (fever, nausea, vomiting, diarrhea)? °All of this is helpful to know when trying to find the cause of the pain. °CAUSES  °· Stomach: virus or bacteria infection, or ulcer. °· Intestine: appendicitis (inflamed appendix), regional ileitis (Crohn's disease), ulcerative colitis (inflamed colon), irritable bowel syndrome, diverticulitis (inflamed diverticulum of the colon), or cancer of the stomach or intestine. °· Gallbladder disease or stones in the gallbladder. °· Kidney disease, kidney stones, or infection. °· Pancreas infection or cancer. °· Fibromyalgia (pain disorder). °· Diseases of the female organs: °¨ Uterus: fibroid (non-cancerous) tumors or infection. °¨ Fallopian tubes: infection or tubal pregnancy. °¨ Ovary: cysts or tumors. °¨ Pelvic adhesions (scar tissue). °¨ Endometriosis (uterus lining tissue growing in the pelvis and on the pelvic organs). °¨ Pelvic congestion syndrome (female organs filling up with blood just before the menstrual period). °¨ Pain with the menstrual period. °¨ Pain with ovulation (producing an egg). °¨ Pain with an IUD (intrauterine device, birth control) in the uterus. °¨ Cancer of the female organs. °· Functional pain (pain not caused by a disease, may improve without treatment). °· Psychological pain. °· Depression. °DIAGNOSIS  °Your doctor will decide the seriousness of your pain by doing an examination. °· Blood tests. °· X-rays. °· Ultrasound. °· CT scan (computed tomography, special type of X-ray). °· MRI (magnetic resonance imaging). °· Cultures, for infection. °· Barium enema (dye inserted in the large intestine, to better view it with  X-rays). °· Colonoscopy (looking in intestine with a lighted tube). °· Laparoscopy (minor surgery, looking in abdomen with a lighted tube). °· Major abdominal exploratory surgery (looking in abdomen with a large incision). °TREATMENT  °The treatment will depend on the cause of the pain.  °· Many cases can be observed and treated at home. °· Over-the-counter medicines recommended by your caregiver. °· Prescription medicine. °· Antibiotics, for infection. °· Birth control pills, for painful periods or for ovulation pain. °· Hormone treatment, for endometriosis. °· Nerve blocking injections. °· Physical therapy. °· Antidepressants. °· Counseling with a psychologist or psychiatrist. °· Minor or major surgery. °HOME CARE INSTRUCTIONS  °· Do not take laxatives, unless directed by your caregiver. °· Take over-the-counter pain medicine only if ordered by your caregiver. Do not take aspirin because it can cause an upset stomach or bleeding. °· Try a clear liquid diet (broth or water) as ordered by your caregiver. Slowly move to a bland diet, as tolerated, if the pain is related to the stomach or intestine. °· Have a thermometer and take your temperature several times a day, and record it. °· Bed rest and sleep, if it helps the pain. °· Avoid sexual intercourse, if it causes pain. °· Avoid stressful situations. °· Keep your follow-up appointments and tests, as your caregiver orders. °· If the pain does not go away with medicine or surgery, you may try: °¨ Acupuncture. °¨ Relaxation exercises (yoga, meditation). °¨ Group therapy. °¨ Counseling. °SEEK MEDICAL CARE IF:  °· You notice certain foods cause stomach pain. °· Your home care treatment is not helping your pain. °· You need stronger pain medicine. °· You want your IUD removed. °· You feel faint or   lightheaded. °· You develop nausea and vomiting. °· You develop a rash. °· You are having side effects or an allergy to your medicine. °SEEK IMMEDIATE MEDICAL CARE IF:  °· Your  pain does not go away or gets worse. °· You have a fever. °· Your pain is felt only in portions of the abdomen. The right side could possibly be appendicitis. The left lower portion of the abdomen could be colitis or diverticulitis. °· You are passing blood in your stools (bright red or black tarry stools, with or without vomiting). °· You have blood in your urine. °· You develop chills, with or without a fever. °· You pass out. °MAKE SURE YOU:  °· Understand these instructions. °· Will watch your condition. °· Will get help right away if you are not doing well or get worse. °Document Released: 11/26/2006 Document Revised: 04/23/2011 Document Reviewed: 12/16/2008 °[employer]® Patient Information ©2015 [employer]. This information is not intended to replace advice given to you by your health care provider. Make sure you discuss any questions you have with your health care provider. ° °

## 2013-08-10 NOTE — ED Notes (Signed)
Pt alert and oriented at discharge.  Pt provided a wheelchair at discharge.  Husband at bedside during discharge instructions.

## 2013-08-11 ENCOUNTER — Encounter (HOSPITAL_COMMUNITY): Payer: Self-pay | Admitting: Emergency Medicine

## 2013-08-11 ENCOUNTER — Emergency Department (HOSPITAL_COMMUNITY)
Admission: EM | Admit: 2013-08-11 | Discharge: 2013-08-11 | Discharge status: Against Medical Advice | Payer: Medicare Other | Attending: Emergency Medicine | Admitting: Emergency Medicine

## 2013-08-11 DIAGNOSIS — R109 Unspecified abdominal pain: Secondary | ICD-10-CM | POA: Insufficient documentation

## 2013-08-11 NOTE — ED Notes (Signed)
Per pt, states abdominal pain for a couple of days-was at [facility] for same symptoms but left, states nurse told her to come here "because we are quicker and better"

## 2013-08-22 DIAGNOSIS — R197 Diarrhea, unspecified: Secondary | ICD-10-CM | POA: Insufficient documentation

## 2013-08-22 DIAGNOSIS — E876 Hypokalemia: Secondary | ICD-10-CM | POA: Insufficient documentation

## 2013-08-27 ENCOUNTER — Ambulatory Visit: Payer: Self-pay | Admitting: Pain Medicine

## 2013-09-02 ENCOUNTER — Ambulatory Visit: Payer: Self-pay | Admitting: Pain Medicine

## 2013-09-06 ENCOUNTER — Encounter (HOSPITAL_COMMUNITY): Payer: Self-pay | Admitting: Emergency Medicine

## 2013-09-06 ENCOUNTER — Emergency Department (HOSPITAL_COMMUNITY)
Admission: EM | Admit: 2013-09-06 | Discharge: 2013-09-06 | Disposition: A | Discharge status: Home / Self Care | Payer: Medicare Other | Attending: Emergency Medicine | Admitting: Emergency Medicine

## 2013-09-06 DIAGNOSIS — Z8739 Personal history of other diseases of the musculoskeletal system and connective tissue: Secondary | ICD-10-CM | POA: Insufficient documentation

## 2013-09-06 DIAGNOSIS — R112 Nausea with vomiting, unspecified: Secondary | ICD-10-CM | POA: Insufficient documentation

## 2013-09-06 DIAGNOSIS — Z90710 Acquired absence of both cervix and uterus: Secondary | ICD-10-CM | POA: Diagnosis not present

## 2013-09-06 DIAGNOSIS — Z792 Long term (current) use of antibiotics: Secondary | ICD-10-CM | POA: Insufficient documentation

## 2013-09-06 DIAGNOSIS — Z79899 Other long term (current) drug therapy: Secondary | ICD-10-CM | POA: Diagnosis not present

## 2013-09-06 DIAGNOSIS — G8929 Other chronic pain: Secondary | ICD-10-CM | POA: Diagnosis not present

## 2013-09-06 DIAGNOSIS — IMO0001 Reserved for inherently not codable concepts without codable children: Secondary | ICD-10-CM | POA: Insufficient documentation

## 2013-09-06 DIAGNOSIS — R1032 Left lower quadrant pain: Secondary | ICD-10-CM | POA: Insufficient documentation

## 2013-09-06 DIAGNOSIS — R109 Unspecified abdominal pain: Secondary | ICD-10-CM | POA: Insufficient documentation

## 2013-09-06 DIAGNOSIS — E079 Disorder of thyroid, unspecified: Secondary | ICD-10-CM | POA: Diagnosis not present

## 2013-09-06 LAB — URINALYSIS, ROUTINE W REFLEX MICROSCOPIC
Bilirubin Urine: NEGATIVE
Glucose, UA: NEGATIVE mg/dL
Hgb urine dipstick: NEGATIVE
KETONES UR: NEGATIVE mg/dL
Leukocytes, UA: NEGATIVE
NITRITE: NEGATIVE
Protein, ur: NEGATIVE mg/dL
Specific Gravity, Urine: <1.005 — ABNORMAL LOW (ref 1.005–1.030)
UROBILINOGEN UA: 0.2 mg/dL (ref 0.0–1.0)
pH: 6.5 (ref 5.0–8.0)

## 2013-09-06 LAB — COMPREHENSIVE METABOLIC PANEL
ALK PHOS: 140 U/L — AB (ref 39–117)
ALT: 28 U/L (ref 0–35)
ANION GAP: 16 — AB (ref 5–15)
AST: 30 U/L (ref 0–37)
Albumin: 3.7 g/dL (ref 3.5–5.2)
BUN: 15 mg/dL (ref 6–23)
CHLORIDE: 101 meq/L (ref 96–112)
CO2: 24 meq/L (ref 19–32)
Calcium: 9.2 mg/dL (ref 8.4–10.5)
Creatinine, Ser: 0.83 mg/dL (ref 0.50–1.10)
GFR, EST NON AFRICAN AMERICAN: 81 mL/min — AB (ref >90)
GLUCOSE: 104 mg/dL — AB (ref 70–99)
POTASSIUM: 3.6 meq/L — AB (ref 3.7–5.3)
Sodium: 141 mEq/L (ref 137–147)
Total Bilirubin: 0.3 mg/dL (ref 0.3–1.2)
Total Protein: 7.2 g/dL (ref 6.0–8.3)

## 2013-09-06 LAB — CBC WITH DIFFERENTIAL/PLATELET
Basophils Absolute: 0 10*3/uL (ref 0.0–0.1)
Basophils Relative: 1 % (ref 0–1)
Eosinophils Absolute: 0.2 10*3/uL (ref 0.0–0.7)
Eosinophils Relative: 3 % (ref 0–5)
HCT: 38.4 % (ref 36.0–46.0)
HEMOGLOBIN: 12.6 g/dL (ref 12.0–15.0)
LYMPHS ABS: 1.3 10*3/uL (ref 0.7–4.0)
LYMPHS PCT: 23 % (ref 12–46)
MCH: 29 pg (ref 26.0–34.0)
MCHC: 32.8 g/dL (ref 30.0–36.0)
MCV: 88.5 fL (ref 78.0–100.0)
MONOS PCT: 6 % (ref 3–12)
Monocytes Absolute: 0.3 10*3/uL (ref 0.1–1.0)
NEUTROS PCT: 69 % (ref 43–77)
Neutro Abs: 4 10*3/uL (ref 1.7–7.7)
Platelets: 196 10*3/uL (ref 150–400)
RBC: 4.34 MIL/uL (ref 3.87–5.11)
RDW: 13.8 % (ref 11.5–15.5)
WBC: 5.9 10*3/uL (ref 4.0–10.5)

## 2013-09-06 MED ORDER — HEPARIN SOD (PORK) LOCK FLUSH 100 UNIT/ML IV SOLN
500.0000 [IU] | INTRAVENOUS | Status: AC | PRN
  Administered 2013-09-06: 500 [IU]

## 2013-09-06 MED ORDER — ONDANSETRON HCL 4 MG/2ML IJ SOLN
4.0000 mg | Freq: Once | INTRAMUSCULAR | Status: AC
  Administered 2013-09-06: 4 mg via INTRAVENOUS
  Filled 2013-09-06: qty 2

## 2013-09-06 MED ORDER — METOCLOPRAMIDE HCL 5 MG/ML IJ SOLN: 10.0000 mg | Freq: Once | INTRAMUSCULAR | Status: DC

## 2013-09-06 MED ORDER — HYDROMORPHONE HCL PF 1 MG/ML IJ SOLN
1.0000 mg | Freq: Once | INTRAMUSCULAR | Status: AC
  Administered 2013-09-06: 1 mg via INTRAVENOUS
  Filled 2013-09-06: qty 1

## 2013-09-06 MED ORDER — SODIUM CHLORIDE 0.9 % IV BOLUS (SEPSIS)
1000.0000 mL | Freq: Once | INTRAVENOUS | Status: AC
  Administered 2013-09-06: 1000 mL via INTRAVENOUS

## 2013-09-06 MED ORDER — SODIUM CHLORIDE 0.9 % IJ SOLN
10.0000 mL | INTRAMUSCULAR | Status: DC | PRN
  Administered 2013-09-06: 10 mL

## 2013-09-06 MED ORDER — TRAMADOL HCL 50 MG PO TABS: 50.0000 mg | ORAL_TABLET | Freq: Four times a day (QID) | ORAL | Status: DC | PRN

## 2013-09-06 NOTE — ED Notes (Signed)
Pt. Vomited 2 times.

## 2013-09-06 NOTE — ED Notes (Signed)
Dr. Jacubowitz at bedside 

## 2013-09-06 NOTE — ED Provider Notes (Signed)
CSN: [id number]     Arrival date & time 09/06/13  1157 History   First MD Initiated Contact with Patient 09/06/13 1301     Chief Complaint  Patient presents with  . Abdominal Pain  . Nausea  . Emesis   HPI  Adrienne Flores is a 48 y.o. female with a PMH of diverticulitis, chronic abdominal pain, fibromyalia, lupus, porphyria, DDD, and RA who presents to the ED for evaluation of abdominal pain, nausea and vomiting. History was provided by the patient. Patient states she has had nausea, vomiting and abdominal pain which started this morning. Patient complains of a constant stabbing pain in her left lower quadrant. Pain today is similar to her chronic abdominal pain with no acute changes. States has been taking Tramadol, oxycodone, and Phenergan but this has not improved her symptoms. Nothing makes her symptoms better or worse. Patient had 3 episodes of nonbloody and nonbilious emesis today. She denies any diarrhea or constipation. No hematochezia, dysuria, vaginal bleeding, vaginal discharge. Previous surgeries include abdominal hysterectomy and cholecystectomy  Patient last seen in the ED on 08/10/13 for abdominal pain. Had repeat CT which was negative. Patient also seen for chronic abdominal pain on 07/25/13 and admitted for further evaluation. She was evaluated by [name] with GI who attributed her pain to functional bowel disease and IBS. She was discharged on 07/28/13. Patient was also admitted at [facility] on 7/11 for abdominal pain and discharged on 7/12. She states she was doing well after discharge until today.   Past Medical History  Diagnosis Date  . Rheumatoid arthritis   . Gastroparesis   . Degenerative disc disease   . Fibromyalgia   . Diverticulitis   . Thyroid disease   . Lupus   . Porphyria    Past Surgical History  Procedure Laterality Date  . Cesarean section    . Replacement total knee Bilateral   . Abdominal hysterectomy    . Cholecystectomy     Family History   Problem Relation Age of Onset  . Leukemia Mother    History  Substance Use Topics  . Smoking status: Never Smoker   . Smokeless tobacco: Not on file  . Alcohol Use: Yes     Comment: social   OB History   Grav Para Term Preterm Abortions TAB SAB Ect Mult Living                 Review of Systems  Constitutional: Negative for fever, chills, activity change, appetite change and fatigue.  Respiratory: Negative for cough and shortness of breath.   Cardiovascular: Negative for chest pain and leg swelling.  Gastrointestinal: Positive for nausea, vomiting and abdominal pain. Negative for diarrhea, constipation, blood in stool, anal bleeding and rectal pain.  Genitourinary: Negative for dysuria, decreased urine volume, vaginal bleeding, vaginal discharge, difficulty urinating, vaginal pain and pelvic pain.  Musculoskeletal: Negative for back pain and myalgias.  Neurological: Negative for dizziness, weakness, light-headedness and headaches.    Allergies  Methotrexate derivatives; Morphine and related; and Relafen  Home Medications   Prior to Admission medications   Medication Sig Start Date End Date Taking? Authorizing Provider  cefpodoxime (VANTIN) 200 MG tablet Take 1 tablet (200 mg total) by mouth every 12 (twelve) hours. 07/28/13   Orson Eva, MD  gabapentin (NEURONTIN) 600 MG tablet Take 600 mg by mouth 3 (three) times daily.    Historical Provider, MD  levothyroxine (SYNTHROID, LEVOTHROID) 25 MCG tablet Take 25 mcg by mouth daily before breakfast.  Take with 366mcg to = 320mcg total    Historical Provider, MD  levothyroxine (SYNTHROID, LEVOTHROID) 300 MCG tablet Take 300 mcg by mouth daily before breakfast. Take with a 41mcg tablet to = 331mcg    Historical Provider, MD  metoCLOPramide (REGLAN) 10 MG tablet Take 1 tablet (10 mg total) by mouth 4 (four) times daily. 05/03/13   Carlisle Cater, PA-C  omeprazole (PRILOSEC) 20 MG capsule Take 1 capsule (20 mg total) by mouth 2 (two) times  daily before a meal. 03/19/13   Lorrine Kin, PA-C  ondansetron (ZOFRAN ODT) 4 MG disintegrating tablet Take 1 tablet (4 mg total) by mouth every 8 (eight) hours as needed for nausea or vomiting. 03/19/13   Lorrine Kin, PA-C  oxyCODONE (OXY IR/ROXICODONE) 5 MG immediate release tablet Take 5 mg by mouth every 4 (four) hours as needed for severe pain.    Historical Provider, MD  oxyCODONE-acetaminophen (PERCOCET) 5-325 MG per tablet Take 2 tablets by mouth every 4 (four) hours as needed. 07/23/13   Veryl Speak, MD  tizanidine (ZANAFLEX) 2 MG capsule Take 2-4 mg by mouth 3 (three) times daily as needed for muscle spasms.  09/29/12   Historical Provider, MD   BP 136/95  Pulse 108  Temp(Src) 97.7 F (36.5 C) (Oral)  Resp 18  SpO2 97%  Filed Vitals:   09/06/13 1630 09/06/13 1645 09/06/13 1730 09/06/13 1752  BP: 121/88 143/96 117/83 122/76  Pulse: 61 72 79 60  Temp:      TempSrc:      Resp:    22  SpO2: 100% 99% 95% 97%    Physical Exam  Nursing note and vitals reviewed. Constitutional: She is oriented to person, place, and time. She appears well-developed and well-nourished. No distress.  HENT:  Head: Normocephalic and atraumatic.  Right Ear: External ear normal.  Left Ear: External ear normal.  Mouth/Throat: Oropharynx is clear and moist.  Eyes: Conjunctivae are normal. Right eye exhibits no discharge. Left eye exhibits no discharge.  Neck: Neck supple.  Cardiovascular: Normal rate, regular rhythm, normal heart sounds and intact distal pulses.  Exam reveals no gallop and no friction rub.   No murmur heard. Pulmonary/Chest: Effort normal and breath sounds normal. No respiratory distress. She has no wheezes. She has no rales. She exhibits no tenderness.  Abdominal: Soft. Bowel sounds are normal. She exhibits no distension and no mass. There is tenderness. There is no rebound and no guarding.  Mild tenderness to palpation to the left lower quadrant  Musculoskeletal: Normal range of  motion. She exhibits no edema and no tenderness.  No CVA, lumbar, or flank tenderness bilaterally.   Neurological: She is alert and oriented to person, place, and time.  Skin: Skin is warm and dry. She is not diaphoretic.     ED Course  Procedures (including critical care time) Labs Review Labs Reviewed - No data to display  Imaging Review No results found.   EKG Interpretation None      Results for orders placed during the hospital encounter of 09/06/13  CBC WITH DIFFERENTIAL      Result Value Ref Range   WBC 5.9  4.0 - 10.5 K/uL   RBC 4.34  3.87 - 5.11 MIL/uL   Hemoglobin 12.6  12.0 - 15.0 g/dL   HCT 38.4  36.0 - 46.0 %   MCV 88.5  78.0 - 100.0 fL   MCH 29.0  26.0 - 34.0 pg   MCHC 32.8  30.0 - 36.0  g/dL   RDW 13.8  11.5 - 15.5 %   Platelets 196  150 - 400 K/uL   Neutrophils Relative % 69  43 - 77 %   Neutro Abs 4.0  1.7 - 7.7 K/uL   Lymphocytes Relative 23  12 - 46 %   Lymphs Abs 1.3  0.7 - 4.0 K/uL   Monocytes Relative 6  3 - 12 %   Monocytes Absolute 0.3  0.1 - 1.0 K/uL   Eosinophils Relative 3  0 - 5 %   Eosinophils Absolute 0.2  0.0 - 0.7 K/uL   Basophils Relative 1  0 - 1 %   Basophils Absolute 0.0  0.0 - 0.1 K/uL  COMPREHENSIVE METABOLIC PANEL      Result Value Ref Range   Sodium 141  137 - 147 mEq/L   Potassium 3.6 (*) 3.7 - 5.3 mEq/L   Chloride 101  96 - 112 mEq/L   CO2 24  19 - 32 mEq/L   Glucose, Bld 104 (*) 70 - 99 mg/dL   BUN 15  6 - 23 mg/dL   Creatinine, Ser 0.83  0.50 - 1.10 mg/dL   Calcium 9.2  8.4 - 10.5 mg/dL   Total Protein 7.2  6.0 - 8.3 g/dL   Albumin 3.7  3.5 - 5.2 g/dL   AST 30  0 - 37 U/L   ALT 28  0 - 35 U/L   Alkaline Phosphatase 140 (*) 39 - 117 U/L   Total Bilirubin 0.3  0.3 - 1.2 mg/dL   GFR calc non Af Amer 81 (*) >90 mL/min   GFR calc Af Amer >90  >90 mL/min   Anion gap 16 (*) 5 - 15  URINALYSIS, ROUTINE W REFLEX MICROSCOPIC      Result Value Ref Range   Color, Urine YELLOW  YELLOW   APPearance CLEAR  CLEAR   Specific  Gravity, Urine <1.005 (*) 1.005 - 1.030   pH 6.5  5.0 - 8.0   Glucose, UA NEGATIVE  NEGATIVE mg/dL   Hgb urine dipstick NEGATIVE  NEGATIVE   Bilirubin Urine NEGATIVE  NEGATIVE   Ketones, ur NEGATIVE  NEGATIVE mg/dL   Protein, ur NEGATIVE  NEGATIVE mg/dL   Urobilinogen, UA 0.2  0.0 - 1.0 mg/dL   Nitrite NEGATIVE  NEGATIVE   Leukocytes, UA NEGATIVE  NEGATIVE    MDM   KERRIA SAPIEN is a 48 y.o. female with a PMH of diverticulitis, chronic abdominal pain, fibromyalia, lupus, porphyria, DDD, and RA who presents to the ED for evaluation of abdominal pain, nausea and vomiting. Patient has hx of chronic abdominal pain similar to her pain today. Has numerous visits and two recent admissions for the same. Abdominal exam benign today. Patient had improvements in her pain throughout her ED visit. Labs unremarkable. No leukocytosis. Patient has follow-up with [facility]. Return precautions, discharge instructions, and follow-up was discussed with the patient before discharge.    Rechecks  2:30 PM = Pain currently is a 6/10. 4:45 PM = Pain currently a 5/10. Patient states this manageable. Asking for refill of Ultram. Able to tolerate PO challenge.     Discharge Medication List as of 09/06/2013  4:53 PM    START taking these medications   Details  !! traMADol (ULTRAM) 50 MG tablet Take 1 tablet (50 mg total) by mouth every 6 (six) hours as needed., Starting 09/06/2013, Until Discontinued, Print     !! - Potential duplicate medications found. Please discuss with provider.  Final impressions: 1. Chronic abdominal pain      Mercy Moore PA-C   This patient was discussed with Dr. Elicia Lamp, PA-C 09/06/13 2244

## 2013-09-06 NOTE — ED Notes (Signed)
Pt. Stated, I started having nausea and vomiting this morning with abdominal pain.

## 2013-09-06 NOTE — ED Provider Notes (Signed)
Plains of abdominal pain constant for 8 years. She presents today his pain became worse and she vomited twice this morning. Pain is now under control. She is able to eat in the emergency department. Patient is followed in a pain clinic for chronic back pain and has followup arranged at [facility] for chronic abdominal pain  Orlie Dakin, MD 09/06/13 1621

## 2013-09-06 NOTE — ED Notes (Signed)
IV team at the bedside. 

## 2013-09-06 NOTE — ED Notes (Signed)
Jessica, PA at bedside

## 2013-09-06 NOTE — ED Notes (Addendum)
Pt c/o lower abd pain x 1 day, worsening this morning. Reports decrease appetite since pain started. Has hx of similar symptoms. Pain reports as constant stabbing pain. States she is trying to get an appointment at [facility] for gastroscopy. Reports tramadol helps pain, out of prescription x 5 days

## 2013-09-06 NOTE — ED Notes (Signed)
Called IV team to deaccess port-a-cath

## 2013-09-06 NOTE — Discharge Instructions (Signed)
Continue to take tramadol for pain  Return to the emergency department if you develop any changing/worsening condition, fever, worsening pain, repeated vomiting, or any other concerns (please read additional information regarding your condition below)   Abdominal Pain Many things can cause abdominal pain. Usually, abdominal pain is not caused by a disease and will improve without treatment. It can often be observed and treated at home. Your health care provider will do a physical exam and possibly order blood tests and X-rays to help determine the seriousness of your pain. However, in many cases, more time must pass before a clear cause of the pain can be found. Before that point, your health care provider may not know if you need more testing or further treatment. HOME CARE INSTRUCTIONS  Monitor your abdominal pain for any changes. The following actions may help to alleviate any discomfort you are experiencing:  Only take over-the-counter or prescription medicines as directed by your health care provider.  Do not take laxatives unless directed to do so by your health care provider.  Try a clear liquid diet (broth, tea, or water) as directed by your health care provider. Slowly move to a bland diet as tolerated. SEEK MEDICAL CARE IF:  You have unexplained abdominal pain.  You have abdominal pain associated with nausea or diarrhea.  You have pain when you urinate or have a bowel movement.  You experience abdominal pain that wakes you in the night.  You have abdominal pain that is worsened or improved by eating food.  You have abdominal pain that is worsened with eating fatty foods.  You have a fever. SEEK IMMEDIATE MEDICAL CARE IF:   Your pain does not go away within 2 hours.  You keep throwing up (vomiting).  Your pain is felt only in portions of the abdomen, such as the right side or the left lower portion of the abdomen.  You pass bloody or black tarry stools. MAKE SURE  YOU:  Understand these instructions.   Will watch your condition.   Will get help right away if you are not doing well or get worse.  Document Released: 11/08/2004 Document Revised: 02/03/2013 Document Reviewed: 10/08/2012 [facility] Patient Information 2015 [place], [place]. This information is not intended to replace advice given to you by your health care provider. Make sure you discuss any questions you have with your health care provider.

## 2013-09-06 NOTE — ED Notes (Signed)
Case management at bedside.

## 2013-09-06 NOTE — ED Notes (Signed)
Provider at bedside

## 2013-09-10 NOTE — ED Provider Notes (Signed)
Medical screening examination/treatment/procedure(s) were conducted as a shared visit with non-physician practitioner(s) and myself.  I personally evaluated the patient during the encounter.   EKG Interpretation None       Orlie Dakin, MD 09/10/13 1718

## 2013-09-11 ENCOUNTER — Encounter (HOSPITAL_COMMUNITY): Payer: Self-pay | Admitting: Emergency Medicine

## 2013-09-11 ENCOUNTER — Emergency Department (HOSPITAL_COMMUNITY): Payer: Medicare Other

## 2013-09-11 ENCOUNTER — Emergency Department (HOSPITAL_COMMUNITY)
Admission: EM | Admit: 2013-09-11 | Discharge: 2013-09-11 | Disposition: A | Discharge status: Home / Self Care | Payer: Medicare Other | Attending: Emergency Medicine | Admitting: Emergency Medicine

## 2013-09-11 DIAGNOSIS — Z9089 Acquired absence of other organs: Secondary | ICD-10-CM | POA: Insufficient documentation

## 2013-09-11 DIAGNOSIS — G8929 Other chronic pain: Secondary | ICD-10-CM | POA: Diagnosis not present

## 2013-09-11 DIAGNOSIS — Z90710 Acquired absence of both cervix and uterus: Secondary | ICD-10-CM | POA: Insufficient documentation

## 2013-09-11 DIAGNOSIS — R42 Dizziness and giddiness: Secondary | ICD-10-CM | POA: Insufficient documentation

## 2013-09-11 DIAGNOSIS — R112 Nausea with vomiting, unspecified: Secondary | ICD-10-CM | POA: Diagnosis present

## 2013-09-11 DIAGNOSIS — R109 Unspecified abdominal pain: Secondary | ICD-10-CM | POA: Diagnosis not present

## 2013-09-11 DIAGNOSIS — K3184 Gastroparesis: Secondary | ICD-10-CM | POA: Diagnosis not present

## 2013-09-11 DIAGNOSIS — Z79899 Other long term (current) drug therapy: Secondary | ICD-10-CM | POA: Insufficient documentation

## 2013-09-11 DIAGNOSIS — IMO0001 Reserved for inherently not codable concepts without codable children: Secondary | ICD-10-CM | POA: Insufficient documentation

## 2013-09-11 DIAGNOSIS — E079 Disorder of thyroid, unspecified: Secondary | ICD-10-CM | POA: Diagnosis not present

## 2013-09-11 DIAGNOSIS — M069 Rheumatoid arthritis, unspecified: Secondary | ICD-10-CM | POA: Diagnosis not present

## 2013-09-11 LAB — CBC WITH DIFFERENTIAL/PLATELET
Basophils Absolute: 0 10*3/uL (ref 0.0–0.1)
Basophils Relative: 0 % (ref 0–1)
Eosinophils Absolute: 0 10*3/uL (ref 0.0–0.7)
Eosinophils Relative: 1 % (ref 0–5)
HCT: 38.9 % (ref 36.0–46.0)
Hemoglobin: 12.5 g/dL (ref 12.0–15.0)
Lymphocytes Relative: 19 % (ref 12–46)
Lymphs Abs: 1.1 10*3/uL (ref 0.7–4.0)
MCH: 29 pg (ref 26.0–34.0)
MCHC: 32.1 g/dL (ref 30.0–36.0)
MCV: 90.3 fL (ref 78.0–100.0)
Monocytes Absolute: 0.3 10*3/uL (ref 0.1–1.0)
Monocytes Relative: 5 % (ref 3–12)
Neutro Abs: 4.4 10*3/uL (ref 1.7–7.7)
Neutrophils Relative %: 75 % (ref 43–77)
Platelets: 209 10*3/uL (ref 150–400)
RBC: 4.31 MIL/uL (ref 3.87–5.11)
RDW: 13.9 % (ref 11.5–15.5)
WBC: 5.9 10*3/uL (ref 4.0–10.5)

## 2013-09-11 LAB — COMPREHENSIVE METABOLIC PANEL
ALT: 27 U/L (ref 0–35)
AST: 28 U/L (ref 0–37)
Albumin: 3.8 g/dL (ref 3.5–5.2)
Alkaline Phosphatase: 131 U/L — ABNORMAL HIGH (ref 39–117)
Anion gap: 14 (ref 5–15)
BUN: 12 mg/dL (ref 6–23)
CO2: 23 mEq/L (ref 19–32)
Calcium: 9.1 mg/dL (ref 8.4–10.5)
Chloride: 104 mEq/L (ref 96–112)
Creatinine, Ser: 0.76 mg/dL (ref 0.50–1.10)
GFR calc Af Amer: >90 mL/min (ref >90)
GFR calc non Af Amer: >90 mL/min (ref >90)
Glucose, Bld: 99 mg/dL (ref 70–99)
Potassium: 3.6 mEq/L — ABNORMAL LOW (ref 3.7–5.3)
Sodium: 141 mEq/L (ref 137–147)
Total Bilirubin: 0.3 mg/dL (ref 0.3–1.2)
Total Protein: 7.3 g/dL (ref 6.0–8.3)

## 2013-09-11 LAB — URINALYSIS, ROUTINE W REFLEX MICROSCOPIC
Bilirubin Urine: NEGATIVE
Glucose, UA: NEGATIVE mg/dL
Hgb urine dipstick: NEGATIVE
Ketones, ur: NEGATIVE mg/dL
Leukocytes, UA: NEGATIVE
Nitrite: NEGATIVE
Protein, ur: NEGATIVE mg/dL
Specific Gravity, Urine: 1.015 (ref 1.005–1.030)
Urobilinogen, UA: 1 mg/dL (ref 0.0–1.0)
pH: 7 (ref 5.0–8.0)

## 2013-09-11 MED ORDER — PROMETHAZINE HCL 25 MG/ML IJ SOLN
12.5000 mg | Freq: Once | INTRAMUSCULAR | Status: AC
  Administered 2013-09-11: 12.5 mg via INTRAVENOUS
  Filled 2013-09-11: qty 1

## 2013-09-11 MED ORDER — MECLIZINE HCL 25 MG PO TABS
25.0000 mg | ORAL_TABLET | Freq: Once | ORAL | Status: AC
  Administered 2013-09-11: 25 mg via ORAL
  Filled 2013-09-11: qty 1

## 2013-09-11 MED ORDER — HYDROMORPHONE HCL PF 1 MG/ML IJ SOLN
1.0000 mg | Freq: Once | INTRAMUSCULAR | Status: AC
  Administered 2013-09-11: 1 mg via INTRAVENOUS
  Filled 2013-09-11: qty 1

## 2013-09-11 MED ORDER — OXYCODONE-ACETAMINOPHEN 5-325 MG PO TABS: 1.0000 | ORAL_TABLET | Freq: Four times a day (QID) | ORAL | Status: DC | PRN

## 2013-09-11 MED ORDER — HYDROMORPHONE HCL PF 1 MG/ML IJ SOLN: 1.0000 mg | Freq: Once | INTRAMUSCULAR | Status: DC

## 2013-09-11 MED ORDER — MECLIZINE HCL 25 MG PO TABS: 25.0000 mg | ORAL_TABLET | Freq: Three times a day (TID) | ORAL | Status: DC | PRN

## 2013-09-11 MED ORDER — SODIUM CHLORIDE 0.9 % IV BOLUS (SEPSIS)
1000.0000 mL | Freq: Once | INTRAVENOUS | Status: AC
  Administered 2013-09-11: 1000 mL via INTRAVENOUS

## 2013-09-11 MED ORDER — ONDANSETRON HCL 4 MG/2ML IJ SOLN
4.0000 mg | Freq: Once | INTRAMUSCULAR | Status: AC
  Administered 2013-09-11: 4 mg via INTRAVENOUS
  Filled 2013-09-11: qty 2

## 2013-09-11 NOTE — ED Provider Notes (Signed)
CSN: [id number]     Arrival date & time 09/11/13  7412 History   First MD Initiated Contact with Patient 09/11/13 1113     Chief Complaint  Patient presents with  . Dizziness  . Emesis     (Consider location/radiation/quality/duration/timing/severity/associated sxs/prior Treatment) HPI Patient presents to the emergency department with abdominal pain, dizziness, with nausea and vomiting.  The patient, states, that she has chronic abdominal pain, states she's been seen multiple times here for this without relief of her symptoms and her doctor told her to return to the emergency department as needed.  Patient, states she has seen GI in the past, but did not go back any further because he did not tell her what is wrong.  Patient, states she does not have any chest pain, shortness of breath, weakness, back pain, neck pain diarrhea, hematemesis, bloody stool, rash, fever, altered mental status, incontinence, dysuria, or syncope.  The patient, states, that she took her home medications without relief of her symptoms.  Patient, states she's had dizziness over the last 3 days.  Patient describes it as slight room spinning. Past Medical History  Diagnosis Date  . Rheumatoid arthritis   . Gastroparesis   . Degenerative disc disease   . Fibromyalgia   . Diverticulitis   . Thyroid disease   . Lupus   . Porphyria    Past Surgical History  Procedure Laterality Date  . Cesarean section    . Replacement total knee Bilateral   . Abdominal hysterectomy    . Cholecystectomy     Family History  Problem Relation Age of Onset  . Leukemia Mother    History  Substance Use Topics  . Smoking status: Never Smoker   . Smokeless tobacco: Not on file  . Alcohol Use: Yes     Comment: social   OB History   Grav Para Term Preterm Abortions TAB SAB Ect Mult Living                 Review of Systems  All other systems negative except as documented in the HPI. All pertinent positives and negatives as  reviewed in the HPI.  Allergies  Methotrexate derivatives; Morphine and related; and Relafen  Home Medications   Prior to Admission medications   Medication Sig Start Date End Date Taking? Authorizing Provider  gabapentin (NEURONTIN) 600 MG tablet Take 600 mg by mouth 3 (three) times daily.   Yes Historical Provider, MD  levothyroxine (SYNTHROID, LEVOTHROID) 25 MCG tablet Take 25 mcg by mouth daily before breakfast. Take with 326mcg to = 337mcg total   Yes Historical Provider, MD  levothyroxine (SYNTHROID, LEVOTHROID) 300 MCG tablet Take 300 mcg by mouth daily before breakfast. Take with a 83mcg tablet to = 330mcg   Yes Historical Provider, MD  metoCLOPramide (REGLAN) 10 MG tablet Take 1 tablet (10 mg total) by mouth 4 (four) times daily. 05/03/13  Yes Carlisle Cater, PA-C  ondansetron (ZOFRAN ODT) 4 MG disintegrating tablet Take 1 tablet (4 mg total) by mouth every 8 (eight) hours as needed for nausea or vomiting. 03/19/13  Yes Lauren Burnetta Sabin, PA-C  oxyCODONE (OXY IR/ROXICODONE) 5 MG immediate release tablet Take 5 mg by mouth every 4 (four) hours as needed for severe pain.   Yes Historical Provider, MD  tizanidine (ZANAFLEX) 2 MG capsule Take 2 mg by mouth 3 (three) times daily as needed for muscle spasms.  09/29/12  Yes Historical Provider, MD  traMADol (ULTRAM) 50 MG tablet Take 1 tablet (  50 mg total) by mouth every 6 (six) hours as needed. 09/06/13  Yes Jessica K Palmer, PA-C   BP 137/95  Pulse 57  Temp(Src) 98.1 F (36.7 C) (Oral)  Resp 16  SpO2 97% Physical Exam  Nursing note and vitals reviewed. Constitutional: She is oriented to person, place, and time. She appears well-developed and well-nourished.  HENT:  Head: Normocephalic and atraumatic.  Mouth/Throat: Oropharynx is clear and moist.  Eyes: Pupils are equal, round, and reactive to light.  Neck: Normal range of motion. Neck supple.  Cardiovascular: Normal rate, regular rhythm and normal heart sounds.  Exam reveals no gallop and  no friction rub.   No murmur heard. Pulmonary/Chest: Effort normal and breath sounds normal. No respiratory distress.  Abdominal: Soft. Bowel sounds are normal. She exhibits no distension. There is tenderness. There is no rebound and no guarding.  Neurological: She is alert and oriented to person, place, and time. She has normal strength and normal reflexes. No cranial nerve deficit or sensory deficit. She exhibits normal muscle tone. Coordination and gait normal. GCS eye subscore is 4. GCS verbal subscore is 5. GCS motor subscore is 6.  Patient has no fine motor deficits on Finger to nose testing  Skin: Skin is warm and dry. No rash noted. No erythema.    ED Course  Procedures (including critical care time) Labs Review Labs Reviewed  COMPREHENSIVE METABOLIC PANEL - Abnormal; Notable for the following:    Potassium 3.6 (*)    Alkaline Phosphatase 131 (*)    All other components within normal limits  URINE CULTURE  CBC WITH DIFFERENTIAL  URINALYSIS, ROUTINE W REFLEX MICROSCOPIC    Imaging Review Dg Abd Acute W/chest  09/11/2013   CLINICAL DATA:  Abdominal pain  EXAM: ACUTE ABDOMEN SERIES (ABDOMEN 2 VIEW & CHEST 1 VIEW)  COMPARISON:  08/10/2013-CT abdomen  FINDINGS: There is no evidence of dilated bowel loops or free intraperitoneal air. No radiopaque calculi or other significant radiographic abnormality is seen. Heart size and mediastinal contours are within normal limits. Both lungs are clear. Right-sided Port-A-Cath in satisfactory position.  IMPRESSION: Negative abdominal radiographs.  No acute cardiopulmonary disease.   Electronically Signed   By: Kathreen Devoid   On: 09/11/2013 14:54     EKG Interpretation   Date/Time:  Friday September 11 2013 10:05:46 EDT Ventricular Rate:  81 PR Interval:  152 QRS Duration: 86 QT Interval:  330 QTC Calculation: 383 R Axis:   -53 Text Interpretation:  Normal sinus rhythm Left axis deviation , new since  last tracing Cannot rule out Anterior  infarct , age undetermined Abnormal  ECG Artifact Confirmed by KNAPP  MD-J, JON ([id number]) on 09/11/2013 1:46:30 PM        Patient will be advised to followup with her primary care Dr. for her chronic abdominal pain.  The patient also has vertiginous symptoms seem consistent with a peripheral vertigo.  She does not have any fine motor deficits noted on exam, and can ambulate and was feeling some better following, meclizine, and Phenergan, although we cannot totally exclude, posterior circulation stroke seems less likely, but still a possibility.  Patient is resting comfortably on reexamination  Brent [facility], [place] 09/13/13 [phone]

## 2013-09-11 NOTE — ED Notes (Signed)
Ambulatory to and from br gait steady

## 2013-09-11 NOTE — ED Notes (Signed)
Pt in c/o dizziness and multiple falls at home, also reports n/v from this, seen here for same a few days ago and states symptoms worsened at home, c/o lower abdominal pain

## 2013-09-11 NOTE — ED Notes (Signed)
Pt sitting upright on cart has difficulty keeping eyes open resp at 10/min sat 95% on room air explained with pts decrease in alertness and shallow resp unable to administer dilaudid explained this to pt and her spouse pt did not verbalize to pt but did open eyes then closed them writer able to touoch pt and call her name and she will respond Probation officer informed provider

## 2013-09-11 NOTE — ED Notes (Signed)
Pt to xray

## 2013-09-11 NOTE — Discharge Instructions (Signed)
Return here as needed.  Followup with your primary care Dr. °

## 2013-09-11 NOTE — ED Notes (Signed)
Er provider informed of pts request for addl pain med

## 2013-09-12 LAB — URINE CULTURE
Colony Count: NO GROWTH
Culture: NO GROWTH

## 2013-09-14 NOTE — ED Provider Notes (Signed)
Medical screening examination/treatment/procedure(s) were performed by non-physician practitioner and as supervising physician I was immediately available for consultation/collaboration.   EKG Interpretation   Date/Time:  Friday September 11 2013 10:05:46 EDT Ventricular Rate:  81 PR Interval:  152 QRS Duration: 86 QT Interval:  330 QTC Calculation: 383 R Axis:   -53 Text Interpretation:  Normal sinus rhythm Left axis deviation , new since  last tracing Cannot rule out Anterior infarct , age undetermined Abnormal  ECG Artifact Confirmed by Gomer France  MD-J, Marrissa Dai ([id number]) on 09/11/2013 1:46:30 PM        Dorie Rank, MD 09/14/13 [phone]

## 2013-09-27 ENCOUNTER — Encounter (HOSPITAL_COMMUNITY): Payer: Self-pay | Admitting: Emergency Medicine

## 2013-09-27 ENCOUNTER — Emergency Department (HOSPITAL_COMMUNITY)
Admission: EM | Admit: 2013-09-27 | Discharge: 2013-09-27 | Disposition: A | Discharge status: Home / Self Care | Payer: Medicare Other | Attending: Emergency Medicine | Admitting: Emergency Medicine

## 2013-09-27 DIAGNOSIS — Z79899 Other long term (current) drug therapy: Secondary | ICD-10-CM | POA: Diagnosis not present

## 2013-09-27 DIAGNOSIS — E079 Disorder of thyroid, unspecified: Secondary | ICD-10-CM | POA: Insufficient documentation

## 2013-09-27 DIAGNOSIS — K3184 Gastroparesis: Secondary | ICD-10-CM | POA: Insufficient documentation

## 2013-09-27 DIAGNOSIS — IMO0001 Reserved for inherently not codable concepts without codable children: Secondary | ICD-10-CM | POA: Diagnosis not present

## 2013-09-27 DIAGNOSIS — R112 Nausea with vomiting, unspecified: Secondary | ICD-10-CM | POA: Diagnosis not present

## 2013-09-27 DIAGNOSIS — R197 Diarrhea, unspecified: Secondary | ICD-10-CM | POA: Diagnosis not present

## 2013-09-27 DIAGNOSIS — R109 Unspecified abdominal pain: Secondary | ICD-10-CM | POA: Diagnosis present

## 2013-09-27 DIAGNOSIS — Z8739 Personal history of other diseases of the musculoskeletal system and connective tissue: Secondary | ICD-10-CM | POA: Diagnosis not present

## 2013-09-27 DIAGNOSIS — G8929 Other chronic pain: Secondary | ICD-10-CM

## 2013-09-27 DIAGNOSIS — R42 Dizziness and giddiness: Secondary | ICD-10-CM | POA: Diagnosis not present

## 2013-09-27 LAB — URINALYSIS, ROUTINE W REFLEX MICROSCOPIC
Bilirubin Urine: NEGATIVE
GLUCOSE, UA: NEGATIVE mg/dL
Hgb urine dipstick: NEGATIVE
KETONES UR: NEGATIVE mg/dL
Nitrite: NEGATIVE
Protein, ur: NEGATIVE mg/dL
Specific Gravity, Urine: 1.007 (ref 1.005–1.030)
Urobilinogen, UA: 0.2 mg/dL (ref 0.0–1.0)
pH: 7 (ref 5.0–8.0)

## 2013-09-27 LAB — URINE MICROSCOPIC-ADD ON

## 2013-09-27 LAB — COMPREHENSIVE METABOLIC PANEL
ALBUMIN: 3.8 g/dL (ref 3.5–5.2)
ALT: 9 U/L (ref 0–35)
AST: 19 U/L (ref 0–37)
Alkaline Phosphatase: 117 U/L (ref 39–117)
Anion gap: 16 — ABNORMAL HIGH (ref 5–15)
BUN: 11 mg/dL (ref 6–23)
CHLORIDE: 104 meq/L (ref 96–112)
CO2: 20 mEq/L (ref 19–32)
CREATININE: 0.97 mg/dL (ref 0.50–1.10)
Calcium: 9.3 mg/dL (ref 8.4–10.5)
GFR calc Af Amer: 78 mL/min — ABNORMAL LOW (ref >90)
GFR calc non Af Amer: 67 mL/min — ABNORMAL LOW (ref >90)
Glucose, Bld: 84 mg/dL (ref 70–99)
Potassium: 3.6 mEq/L — ABNORMAL LOW (ref 3.7–5.3)
Sodium: 140 mEq/L (ref 137–147)
Total Bilirubin: 0.2 mg/dL — ABNORMAL LOW (ref 0.3–1.2)
Total Protein: 7.6 g/dL (ref 6.0–8.3)

## 2013-09-27 LAB — CBC WITH DIFFERENTIAL/PLATELET
Basophils Absolute: 0 10*3/uL (ref 0.0–0.1)
Basophils Relative: 1 % (ref 0–1)
EOS PCT: 2 % (ref 0–5)
Eosinophils Absolute: 0.1 10*3/uL (ref 0.0–0.7)
HCT: 40.3 % (ref 36.0–46.0)
Hemoglobin: 13 g/dL (ref 12.0–15.0)
Lymphocytes Relative: 21 % (ref 12–46)
Lymphs Abs: 1.1 10*3/uL (ref 0.7–4.0)
MCH: 28.4 pg (ref 26.0–34.0)
MCHC: 32.3 g/dL (ref 30.0–36.0)
MCV: 88 fL (ref 78.0–100.0)
MONO ABS: 0.2 10*3/uL (ref 0.1–1.0)
Monocytes Relative: 4 % (ref 3–12)
Neutro Abs: 3.8 10*3/uL (ref 1.7–7.7)
Neutrophils Relative %: 72 % (ref 43–77)
Platelets: 191 10*3/uL (ref 150–400)
RBC: 4.58 MIL/uL (ref 3.87–5.11)
RDW: 14.4 % (ref 11.5–15.5)
WBC: 5.2 10*3/uL (ref 4.0–10.5)

## 2013-09-27 MED ORDER — HYDROMORPHONE HCL PF 1 MG/ML IJ SOLN: 2.0000 mg | Freq: Once | INTRAMUSCULAR | Status: DC

## 2013-09-27 MED ORDER — OXYCODONE-ACETAMINOPHEN 5-325 MG PO TABS
2.0000 | ORAL_TABLET | Freq: Once | ORAL | Status: AC
  Administered 2013-09-27: 2 via ORAL
  Filled 2013-09-27: qty 2

## 2013-09-27 MED ORDER — DICYCLOMINE HCL 10 MG/ML IM SOLN
20.0000 mg | Freq: Once | INTRAMUSCULAR | Status: AC
  Administered 2013-09-27: 20 mg via INTRAMUSCULAR
  Filled 2013-09-27: qty 2

## 2013-09-27 MED ORDER — METOCLOPRAMIDE HCL 5 MG/ML IJ SOLN
10.0000 mg | Freq: Once | INTRAMUSCULAR | Status: AC
  Administered 2013-09-27: 10 mg via INTRAMUSCULAR
  Filled 2013-09-27: qty 2

## 2013-09-27 MED ORDER — PROMETHAZINE HCL 25 MG/ML IJ SOLN
25.0000 mg | Freq: Once | INTRAMUSCULAR | Status: AC
  Administered 2013-09-27: 25 mg via INTRAMUSCULAR
  Filled 2013-09-27: qty 1

## 2013-09-27 MED ORDER — PROMETHAZINE HCL 25 MG RE SUPP: 25.0000 mg | Freq: Four times a day (QID) | RECTAL | Status: DC | PRN

## 2013-09-27 MED ORDER — DIPHENHYDRAMINE HCL 50 MG/ML IJ SOLN
25.0000 mg | Freq: Once | INTRAMUSCULAR | Status: AC
  Administered 2013-09-27: 25 mg via INTRAMUSCULAR
  Filled 2013-09-27: qty 1

## 2013-09-27 NOTE — ED Provider Notes (Signed)
CSN: [id number]     Arrival date & time 09/27/13  1210 History   First MD Initiated Contact with Patient 09/27/13 1356     Chief Complaint  Patient presents with  . Abdominal Pain     (Consider location/radiation/quality/duration/timing/severity/associated sxs/prior Treatment) HPI Comments: The patient is a 48 year old female past medical history significant for chronic abdominal pain, RA, fibromyalgia, diverticulitis, lupus, gastroparesis presenting to the emergency department for 3-4 day history of nausea, nonbloody nonbilious emesis and generalized abdominal pain. She has not tried any of her home medications for her symptoms. She's also complaining she has had continued episodes of dizziness, has been seen in the past for this and diagnosed with vertigo that caused a fall on Wednesday, denies any continued back pain. She has not tried taking her meclizine for her vertigo. Patient is to followup at [facility] with her gastroenterologist on the 20th of this month. Patient has been seen in the emergency department multiple times in the last 6 months for similar symptoms.   Patient is a 48 y.o. female presenting with abdominal pain. The history is provided by the patient and medical records.  Abdominal Pain Associated symptoms: no chest pain, no diarrhea, no nausea, no shortness of breath and no vomiting     Past Medical History  Diagnosis Date  . Rheumatoid arthritis   . Gastroparesis   . Degenerative disc disease   . Fibromyalgia   . Diverticulitis   . Thyroid disease   . Lupus   . Porphyria    Past Surgical History  Procedure Laterality Date  . Cesarean section    . Replacement total knee Bilateral   . Abdominal hysterectomy    . Cholecystectomy     Family History  Problem Relation Age of Onset  . Leukemia Mother    History  Substance Use Topics  . Smoking status: Never Smoker   . Smokeless tobacco: Not on file  . Alcohol Use: Yes     Comment: social   OB History   Grav  Para Term Preterm Abortions TAB SAB Ect Mult Living                 Review of Systems  Respiratory: Negative for shortness of breath.   Cardiovascular: Negative for chest pain.  Gastrointestinal: Positive for abdominal pain. Negative for nausea, vomiting, diarrhea, blood in stool, abdominal distention and anal bleeding.  Neurological: Positive for dizziness. Negative for syncope, weakness and numbness.  All other systems reviewed and are negative.     Allergies  Methotrexate derivatives; Morphine and related; and Relafen  Home Medications   Prior to Admission medications   Medication Sig Start Date End Date Taking? Authorizing Provider  gabapentin (NEURONTIN) 600 MG tablet Take 600 mg by mouth 3 (three) times daily.   Yes Historical Provider, MD  levothyroxine (SYNTHROID, LEVOTHROID) 25 MCG tablet Take 25 mcg by mouth daily before breakfast. Take with 363mcg to = 339mcg total   Yes Historical Provider, MD  levothyroxine (SYNTHROID, LEVOTHROID) 300 MCG tablet Take 300 mcg by mouth daily before breakfast. Take with a 86mcg tablet to = 327mcg   Yes Historical Provider, MD  meclizine (ANTIVERT) 25 MG tablet Take 1 tablet (25 mg total) by mouth 3 (three) times daily as needed. 09/11/13  Yes Resa Miner Lawyer, PA-C  metoCLOPramide (REGLAN) 10 MG tablet Take 1 tablet (10 mg total) by mouth 4 (four) times daily. 05/03/13  Yes Carlisle Cater, PA-C  ondansetron (ZOFRAN ODT) 4 MG disintegrating tablet Take 1  tablet (4 mg total) by mouth every 8 (eight) hours as needed for nausea or vomiting. 03/19/13  Yes Harvie Heck, PA-C  oxyCODONE (OXY IR/ROXICODONE) 5 MG immediate release tablet Take 5 mg by mouth every 4 (four) hours as needed for severe pain.   Yes Historical Provider, MD  oxyCODONE-acetaminophen (PERCOCET/ROXICET) 5-325 MG per tablet Take 1 tablet by mouth every 6 (six) hours as needed for severe pain. 09/11/13  Yes Resa Miner Lawyer, PA-C  tizanidine (ZANAFLEX) 2 MG capsule Take 2 mg by  mouth 3 (three) times daily as needed for muscle spasms.  09/29/12  Yes Historical Provider, MD  traMADol (ULTRAM) 50 MG tablet Take 1 tablet (50 mg total) by mouth every 6 (six) hours as needed. 09/06/13  Yes Lucila [place], PA-C  promethazine (PHENERGAN) 25 MG suppository Place 1 suppository (25 mg total) rectally every 6 (six) hours as needed for nausea or vomiting. You may place vaginally as well. 09/27/13   Deyon Chizek L Lacy Sofia, PA-C   BP 127/85  Pulse 77  Temp(Src) 97.9 F (36.6 C) (Oral)  Resp 20  Ht 5\' 1"  (1.549 m)  Wt 193 lb (87.544 kg)  BMI 36.49 kg/m2  SpO2 99% Physical Exam  Nursing note and vitals reviewed. Constitutional: She is oriented to person, place, and time. She appears well-developed and well-nourished. No distress.  HENT:  Head: Normocephalic and atraumatic.  Right Ear: External ear normal.  Left Ear: External ear normal.  Nose: Nose normal.  Mouth/Throat: Oropharynx is clear and moist. No oropharyngeal exudate.  Eyes: Conjunctivae and EOM are normal. Pupils are equal, round, and reactive to light.  Neck: Normal range of motion. Neck supple.  Cardiovascular: Normal rate, regular rhythm, normal heart sounds and intact distal pulses.   Pulmonary/Chest: Effort normal and breath sounds normal. No respiratory distress.  Abdominal: Soft. Bowel sounds are normal. She exhibits no distension. There is no tenderness. There is no rebound and no guarding.  No abdominal tenderness with distraction.   Musculoskeletal: Normal range of motion.  Neurological: She is alert and oriented to person, place, and time. She has normal strength. No cranial nerve deficit. Gait normal. GCS eye subscore is 4. GCS verbal subscore is 5. GCS motor subscore is 6.  Sensation grossly intact.  No pronator drift.  Bilateral heel-knee-shin intact. Finger-nose-finger intact bilaterally.   Skin: Skin is warm and dry. She is not diaphoretic.  Psychiatric: She has a normal mood and affect.    ED  Course  Procedures (including critical care time) Medications  HYDROmorphone (DILAUDID) injection 2 mg (2 mg Intramuscular Not Given 09/27/13 1716)  promethazine (PHENERGAN) injection 25 mg (25 mg Intramuscular Given 09/27/13 1454)  dicyclomine (BENTYL) injection 20 mg (20 mg Intramuscular Given 09/27/13 1454)  diphenhydrAMINE (BENADRYL) injection 25 mg (25 mg Intramuscular Given 09/27/13 1615)  metoCLOPramide (REGLAN) injection 10 mg (10 mg Intramuscular Given 09/27/13 1620)  oxyCODONE-acetaminophen (PERCOCET/ROXICET) 5-325 MG per tablet 2 tablet (2 tablets Oral Given 09/27/13 1614)    Labs Review Labs Reviewed  COMPREHENSIVE METABOLIC PANEL - Abnormal; Notable for the following:    Potassium 3.6 (*)    Total Bilirubin 0.2 (*)    GFR calc non Af Amer 67 (*)    GFR calc Af Amer 78 (*)    Anion gap 16 (*)    All other components within normal limits  URINALYSIS, ROUTINE W REFLEX MICROSCOPIC - Abnormal; Notable for the following:    Leukocytes, UA TRACE (*)    All other components within normal  limits  URINE MICROSCOPIC-ADD ON - Abnormal; Notable for the following:    Squamous Epithelial / LPF FEW (*)    All other components within normal limits  CBC WITH DIFFERENTIAL    Imaging Review No results found.   EKG Interpretation None      MDM   Final diagnoses:  Chronic abdominal pain  Nausea vomiting and diarrhea    Filed Vitals:   09/27/13 1700  BP: 127/85  Pulse: 77  Temp:   Resp:    Afebrile, NAD, non-toxic appearing, AAOx4. Abdomen soft, non-tender, non-distended. No peritoneal signs. No neurofocal deficits. Patient with multiple visits to the ED with extensive work ups. Patient will be advised to followup with her PCP and DI doctors for her chronic abdominal pain. The patient also has vertiginous symptoms seem consistent with a peripheral vertigo as noted by previous ED visits. She does not have any fine motor deficits noted on exam, and can ambulate and was feeling some  better following, meclizine, and Phenergan, although we cannot totally exclude, posterior circulation stroke seems less likely, but still a possibility. Patient is resting comfortably on reexamination. She is ready to go home at this time. Return precautions discussed. Patient is agreeable to plan. Patient is stable at time of discharge         Harlow Mares, PA-C 09/27/13 2001

## 2013-09-27 NOTE — ED Notes (Signed)
Pt c/o fall on Tuesday/wednesday causing pain to her back. Pt reports falling x 3 this week. Pt c/o pain to abdomen area. Pt has appointment with GI Dr soon. Pt reports vomiting and diarrhea.

## 2013-09-27 NOTE — ED Notes (Signed)
Husband walked out of room and informed this EMT that pt is "Just ready to go". Pt is now fully dressed and ready to go. Nurse informed.

## 2013-09-27 NOTE — Discharge Instructions (Signed)
Please follow up with your primary care physician in 1-2 days. If you do not have one please call the [place] number listed above. Please follow up with your GI doctor as scheduled on the 20th. Please read all discharge instructions and return precautions.    Abdominal Pain Many things can cause abdominal pain. Usually, abdominal pain is not caused by a disease and will improve without treatment. It can often be observed and treated at home. Your health care provider will do a physical exam and possibly order blood tests and X-rays to help determine the seriousness of your pain. However, in many cases, more time must pass before a clear cause of the pain can be found. Before that point, your health care provider may not know if you need more testing or further treatment. HOME CARE INSTRUCTIONS  Monitor your abdominal pain for any changes. The following actions may help to alleviate any discomfort you are experiencing:  Only take over-the-counter or prescription medicines as directed by your health care provider.  Do not take laxatives unless directed to do so by your health care provider.  Try a clear liquid diet (broth, tea, or water) as directed by your health care provider. Slowly move to a bland diet as tolerated. SEEK MEDICAL CARE IF:  You have unexplained abdominal pain.  You have abdominal pain associated with nausea or diarrhea.  You have pain when you urinate or have a bowel movement.  You experience abdominal pain that wakes you in the night.  You have abdominal pain that is worsened or improved by eating food.  You have abdominal pain that is worsened with eating fatty foods.  You have a fever. SEEK IMMEDIATE MEDICAL CARE IF:   Your pain does not go away within 2 hours.  You keep throwing up (vomiting).  Your pain is felt only in portions of the abdomen, such as the right side or the left lower portion of the abdomen.  You pass bloody or black  tarry stools. MAKE SURE YOU:  Understand these instructions.   Will watch your condition.   Will get help right away if you are not doing well or get worse.  Document Released: 11/08/2004 Document Revised: 02/03/2013 Document Reviewed: 10/08/2012 [facility] Patient Information 2015 [place], [place]. This information is not intended to replace advice given to you by your health care provider. Make sure you discuss any questions you have with your health care provider.  Chronic Pain Discharge Instructions  Emergency care providers appreciate that many patients coming to [place] are in severe pain and we wish to address their pain in the safest, most responsible manner.  It is important to recognize however, that the proper treatment of chronic pain differs from that of the pain of injuries and acute illnesses.  Our goal is to provide quality, safe, personalized care and we thank you for giving [place] the opportunity to serve you. The use of narcotics and related agents for chronic pain syndromes may lead to additional physical and psychological problems.  Nearly as many people die from prescription narcotics each year as die from car crashes.  Additionally, this risk is increased if such prescriptions are obtained from a variety of sources.  Therefore, only your primary care physician or a pain management specialist is able to safely treat such syndromes with narcotic medications long-term.    Documentation revealing such prescriptions have been sought from multiple sources may prohibit [place] from providing a refill or different narcotic medication.  Your name may be checked first through the [place].  This database is a record of controlled substance medication prescriptions that the patient has received.  This has been established by [facility] in an effort to eliminate the dangerous, and often life threatening, practice of obtaining multiple prescriptions from  different medical providers.   If you have a chronic pain syndrome (i.e. chronic headaches, recurrent back or neck pain, dental pain, abdominal or pelvis pain without a specific diagnosis, or neuropathic pain such as fibromyalgia) or recurrent visits for the same condition without an acute diagnosis, you may be treated with non-narcotics and other non-addictive medicines.  Allergic reactions or negative side effects that may be reported by a patient to such medications will not typically lead to the use of a narcotic analgesic or other controlled substance as an alternative.   Patients managing chronic pain with a personal physician should have provisions in place for breakthrough pain.  If you are in crisis, you should call your physician.  If your physician directs you to the emergency department, please have the doctor call and speak to our attending physician concerning your care.   When patients come to the Emergency Department (ED) with acute medical conditions in which the Emergency Department physician feels appropriate to prescribe narcotic or sedating pain medication, the physician will prescribe these in very limited quantities.  The amount of these medications will last only until you can see your primary care physician in his/her office.  Any patient who returns to the ED seeking refills should expect only non-narcotic pain medications.   In the event of an acute medical condition exists and the emergency physician feels it is necessary that the patient be given a narcotic or sedating medication -  a responsible adult driver should be present in the room prior to the medication being given by the nurse.   Prescriptions for narcotic or sedating medications that have been lost, stolen or expired will not be refilled in the Emergency Department.    Patients who have chronic pain may receive non-narcotic prescriptions until seen by their primary care physician.  It is every patients personal  responsibility to maintain active prescriptions with his or her primary care physician or specialist.

## 2013-09-29 ENCOUNTER — Ambulatory Visit: Payer: Self-pay | Admitting: Pain Medicine

## 2013-09-30 NOTE — ED Provider Notes (Signed)
Medical screening examination/treatment/procedure(s) were performed by non-physician practitioner and as supervising physician I was immediately available for consultation/collaboration.  Leota Jacobsen, MD 09/30/13 1002

## 2013-10-28 ENCOUNTER — Ambulatory Visit: Payer: Self-pay | Admitting: Pain Medicine

## 2013-11-12 HISTORY — PX: ESOPHAGOGASTRODUODENOSCOPY: SHX1529

## 2013-11-12 HISTORY — PX: COLONOSCOPY: SHX174

## 2013-11-25 ENCOUNTER — Ambulatory Visit: Payer: Self-pay | Admitting: Pain Medicine

## 2013-12-21 ENCOUNTER — Ambulatory Visit: Payer: Self-pay | Admitting: Pain Medicine

## 2014-01-28 ENCOUNTER — Ambulatory Visit: Payer: Self-pay | Admitting: Pain Medicine

## 2014-02-24 ENCOUNTER — Ambulatory Visit: Payer: Self-pay | Admitting: Pain Medicine

## 2014-03-04 ENCOUNTER — Emergency Department (HOSPITAL_COMMUNITY): Payer: Medicare Other

## 2014-03-04 ENCOUNTER — Encounter (HOSPITAL_COMMUNITY): Payer: Self-pay | Admitting: Neurology

## 2014-03-04 ENCOUNTER — Emergency Department (HOSPITAL_COMMUNITY)
Admission: EM | Admit: 2014-03-04 | Discharge: 2014-03-04 | Disposition: A | Discharge status: Home / Self Care | Payer: Medicare Other | Attending: Emergency Medicine | Admitting: Emergency Medicine

## 2014-03-04 DIAGNOSIS — R1032 Left lower quadrant pain: Secondary | ICD-10-CM

## 2014-03-04 DIAGNOSIS — K5900 Constipation, unspecified: Secondary | ICD-10-CM

## 2014-03-04 DIAGNOSIS — Z79899 Other long term (current) drug therapy: Secondary | ICD-10-CM | POA: Diagnosis not present

## 2014-03-04 DIAGNOSIS — M1990 Unspecified osteoarthritis, unspecified site: Secondary | ICD-10-CM | POA: Insufficient documentation

## 2014-03-04 DIAGNOSIS — E039 Hypothyroidism, unspecified: Secondary | ICD-10-CM | POA: Diagnosis not present

## 2014-03-04 DIAGNOSIS — Z90710 Acquired absence of both cervix and uterus: Secondary | ICD-10-CM | POA: Diagnosis not present

## 2014-03-04 DIAGNOSIS — Z9889 Other specified postprocedural states: Secondary | ICD-10-CM | POA: Diagnosis not present

## 2014-03-04 LAB — CBC WITH DIFFERENTIAL/PLATELET
BASOS PCT: 1 % (ref 0–1)
Basophils Absolute: 0.1 10*3/uL (ref 0.0–0.1)
Eosinophils Absolute: 0.1 10*3/uL (ref 0.0–0.7)
Eosinophils Relative: 3 % (ref 0–5)
HCT: 32.6 % — ABNORMAL LOW (ref 36.0–46.0)
Hemoglobin: 10.9 g/dL — ABNORMAL LOW (ref 12.0–15.0)
LYMPHS ABS: 1.3 10*3/uL (ref 0.7–4.0)
Lymphocytes Relative: 32 % (ref 12–46)
MCH: 30.4 pg (ref 26.0–34.0)
MCHC: 33.4 g/dL (ref 30.0–36.0)
MCV: 90.8 fL (ref 78.0–100.0)
MONO ABS: 0.2 10*3/uL (ref 0.1–1.0)
MONOS PCT: 6 % (ref 3–12)
NEUTROS ABS: 2.4 10*3/uL (ref 1.7–7.7)
NEUTROS PCT: 58 % (ref 43–77)
Platelets: 195 10*3/uL (ref 150–400)
RBC: 3.59 MIL/uL — ABNORMAL LOW (ref 3.87–5.11)
RDW: 14.1 % (ref 11.5–15.5)
WBC: 4.1 10*3/uL (ref 4.0–10.5)

## 2014-03-04 LAB — COMPREHENSIVE METABOLIC PANEL
ALK PHOS: 72 U/L (ref 39–117)
ALT: 42 U/L — ABNORMAL HIGH (ref 0–35)
ANION GAP: 9 (ref 5–15)
AST: 57 U/L — ABNORMAL HIGH (ref 0–37)
Albumin: 3.4 g/dL — ABNORMAL LOW (ref 3.5–5.2)
BILIRUBIN TOTAL: 0.5 mg/dL (ref 0.3–1.2)
BUN: 14 mg/dL (ref 6–23)
CHLORIDE: 104 meq/L (ref 96–112)
CO2: 24 mmol/L (ref 19–32)
Calcium: 8.7 mg/dL (ref 8.4–10.5)
Creatinine, Ser: 1.13 mg/dL — ABNORMAL HIGH (ref 0.50–1.10)
GFR calc Af Amer: 65 mL/min — ABNORMAL LOW (ref >90)
GFR calc non Af Amer: 56 mL/min — ABNORMAL LOW (ref >90)
Glucose, Bld: 90 mg/dL (ref 70–99)
Potassium: 3.6 mmol/L (ref 3.5–5.1)
SODIUM: 137 mmol/L (ref 135–145)
Total Protein: 6.6 g/dL (ref 6.0–8.3)

## 2014-03-04 LAB — URINE MICROSCOPIC-ADD ON

## 2014-03-04 LAB — URINALYSIS, ROUTINE W REFLEX MICROSCOPIC
BILIRUBIN URINE: NEGATIVE
GLUCOSE, UA: NEGATIVE mg/dL
Hgb urine dipstick: NEGATIVE
KETONES UR: NEGATIVE mg/dL
NITRITE: NEGATIVE
PH: 6.5 (ref 5.0–8.0)
PROTEIN: NEGATIVE mg/dL
Specific Gravity, Urine: 1.01 (ref 1.005–1.030)
UROBILINOGEN UA: 0.2 mg/dL (ref 0.0–1.0)

## 2014-03-04 LAB — LIPASE, BLOOD: LIPASE: 25 U/L (ref 11–59)

## 2014-03-04 MED ORDER — ONDANSETRON HCL 4 MG/2ML IJ SOLN
4.0000 mg | Freq: Once | INTRAMUSCULAR | Status: AC
  Administered 2014-03-04: 4 mg via INTRAVENOUS
  Filled 2014-03-04: qty 2

## 2014-03-04 MED ORDER — SODIUM CHLORIDE 0.9 % IJ SOLN
INTRAMUSCULAR | Status: AC
  Filled 2014-03-04: qty 10

## 2014-03-04 MED ORDER — HEPARIN SOD (PORK) LOCK FLUSH 100 UNIT/ML IV SOLN
500.0000 [IU] | Freq: Once | INTRAVENOUS | Status: AC
  Administered 2014-03-04: 500 [IU] via INTRAVENOUS
  Filled 2014-03-04: qty 5

## 2014-03-04 MED ORDER — FENTANYL CITRATE 0.05 MG/ML IJ SOLN
50.0000 ug | Freq: Once | INTRAMUSCULAR | Status: AC
  Administered 2014-03-04: 50 ug via INTRAVENOUS
  Filled 2014-03-04: qty 2

## 2014-03-04 MED ORDER — SODIUM CHLORIDE 0.9 % IV SOLN: 1000.0000 mL | INTRAVENOUS | Status: DC

## 2014-03-04 MED ORDER — SODIUM CHLORIDE 0.9 % IV SOLN
1000.0000 mL | Freq: Once | INTRAVENOUS | Status: AC
  Administered 2014-03-04: 1000 mL via INTRAVENOUS

## 2014-03-04 MED ORDER — HYDROMORPHONE HCL 1 MG/ML IJ SOLN
1.0000 mg | Freq: Once | INTRAMUSCULAR | Status: AC
  Administered 2014-03-04: 1 mg via INTRAVENOUS
  Filled 2014-03-04: qty 1

## 2014-03-04 MED ORDER — IOHEXOL 300 MG/ML  SOLN
100.0000 mL | Freq: Once | INTRAMUSCULAR | Status: AC | PRN
  Administered 2014-03-04: 100 mL via INTRAVENOUS

## 2014-03-04 MED ORDER — POLYETHYLENE GLYCOL 3350 17 G PO PACK: 17.0000 g | PACK | Freq: Two times a day (BID) | ORAL | Status: DC

## 2014-03-04 NOTE — ED Notes (Signed)
1000 mL infusion complete.

## 2014-03-04 NOTE — ED Notes (Signed)
Pt reports LLQ abd pain for several days. Thinks it may be her diverticulitis. Denies n/v. Some diarrhea. Pti s a x 4.

## 2014-03-04 NOTE — ED Notes (Signed)
Discharge instructions and prescription reviewed.  Voiced understanding

## 2014-03-04 NOTE — ED Notes (Signed)
Patient transported to CT 

## 2014-03-04 NOTE — ED Provider Notes (Signed)
CSN: [id number]     Arrival date & time 03/04/14  1413 History   First MD Initiated Contact with Patient 03/04/14 1610     Chief Complaint  Patient presents with  . Abdominal Pain     (Consider location/radiation/quality/duration/timing/severity/associated sxs/prior Treatment) HPI 49 year old female with past medical history of prior uncomplicated diverticulitis , RA, lupus, fibromyalgia, porphyria who presents to ED for left lower quadrant pain 4 days. During sign patient reports some mild diarrhea and she denies any nausea, vomiting. She also reports a low-grade fever of 100. She states this feels identical to her diverticulitis in the past. He reports having normal bowel movements prior to yesterday when she began having diarrhea. She has not been on any recent antibiotics. Pain is rated 8/10. No treatment started home. Pain is sharp, nonradiating. No other complaints at this time.   Past Medical History  Diagnosis Date  . Rheumatoid arthritis   . Gastroparesis   . Degenerative disc disease   . Fibromyalgia   . Diverticulitis   . Thyroid disease   . Lupus   . Porphyria    Past Surgical History  Procedure Laterality Date  . Cesarean section    . Replacement total knee Bilateral   . Abdominal hysterectomy    . Cholecystectomy     Family History  Problem Relation Age of Onset  . Leukemia Mother    History  Substance Use Topics  . Smoking status: Never Smoker   . Smokeless tobacco: Not on file  . Alcohol Use: Yes     Comment: social   OB History    No data available     Review of Systems  Constitutional: Positive for fever and appetite change. Negative for chills and activity change.  HENT: Negative for congestion, rhinorrhea and sore throat.   Eyes: Negative for visual disturbance.  Respiratory: Negative for cough and shortness of breath.   Cardiovascular: Negative for chest pain, palpitations and leg swelling.  Gastrointestinal: Positive for abdominal pain and  diarrhea. Negative for nausea, vomiting, constipation, blood in stool, abdominal distention, anal bleeding and rectal pain.  Genitourinary: Negative for dysuria, hematuria, vaginal bleeding and vaginal discharge.  Musculoskeletal: Negative for back pain and neck pain.  Skin: Negative for rash.  Neurological: Negative for weakness and headaches.  All other systems reviewed and are negative.     Allergies  Methotrexate derivatives; Morphine and related; and Relafen  Home Medications   Prior to Admission medications   Medication Sig Start Date End Date Taking? Authorizing Provider  gabapentin (NEURONTIN) 600 MG tablet Take 600 mg by mouth 3 (three) times daily.   Yes Historical Provider, MD  levothyroxine (SYNTHROID, LEVOTHROID) 25 MCG tablet Take 25 mcg by mouth daily before breakfast. Take with 367mcg to = 360mcg total   Yes Historical Provider, MD  levothyroxine (SYNTHROID, LEVOTHROID) 300 MCG tablet Take 300 mcg by mouth daily before breakfast. Take with a 69mcg tablet to = 3101mcg   Yes Historical Provider, MD  meclizine (ANTIVERT) 25 MG tablet Take 1 tablet (25 mg total) by mouth 3 (three) times daily as needed. 09/11/13  Yes Resa Miner Lawyer, PA-C  metoCLOPramide (REGLAN) 10 MG tablet Take 1 tablet (10 mg total) by mouth 4 (four) times daily. 05/03/13  Yes Carlisle Cater, PA-C  ondansetron (ZOFRAN ODT) 4 MG disintegrating tablet Take 1 tablet (4 mg total) by mouth every 8 (eight) hours as needed for nausea or vomiting. 03/19/13  Yes Harvie Heck, PA-C  oxyCODONE (OXY IR/ROXICODONE) 5 MG  immediate release tablet Take 5 mg by mouth 2 (two) times daily. Take everyday per patient   Yes Historical Provider, MD  oxyCODONE-acetaminophen (PERCOCET/ROXICET) 5-325 MG per tablet Take 1 tablet by mouth every 6 (six) hours as needed for severe pain. 09/11/13  Yes Resa Miner Lawyer, PA-C  promethazine (PHENERGAN) 25 MG suppository Place 1 suppository (25 mg total) rectally every 6 (six) hours as  needed for nausea or vomiting. You may place vaginally as well. 09/27/13  Yes Jennifer L Piepenbrink, PA-C  tizanidine (ZANAFLEX) 2 MG capsule Take 2 mg by mouth 3 (three) times daily as needed for muscle spasms.  09/29/12  Yes Historical Provider, MD  traMADol (ULTRAM) 50 MG tablet Take 1 tablet (50 mg total) by mouth every 6 (six) hours as needed. Patient not taking: Reported on 03/04/2014 09/06/13   Lucila Maine, PA-C   BP 94/61 mmHg  Pulse 55  Temp(Src) 97.9 F (36.6 C) (Oral)  Resp 14  SpO2 100% Physical Exam  Constitutional: She is oriented to person, place, and time. She appears well-developed and well-nourished. No distress.  HENT:  Head: Normocephalic and atraumatic.  Eyes: Conjunctivae are normal.  Neck: Normal range of motion.  Cardiovascular: Normal rate, regular rhythm, normal heart sounds and intact distal pulses.   No murmur heard. Pulmonary/Chest: Effort normal and breath sounds normal. No respiratory distress. She has no wheezes. She has no rales. She exhibits no tenderness.  Abdominal: Soft. Bowel sounds are normal. She exhibits no distension. There is no hepatosplenomegaly. There is tenderness in the left lower quadrant. There is no rigidity, no rebound, no guarding, no tenderness at McBurney's point and negative Murphy's sign. No hernia.  Musculoskeletal: Normal range of motion.  Neurological: She is alert and oriented to person, place, and time. No cranial nerve deficit.  Skin: Skin is warm and dry.  Scattered erythematous maculopapular rash with some excoriation to LUE and L abd.  Psychiatric: She has a normal mood and affect.  Nursing note and vitals reviewed.   ED Course  Procedures (including critical care time) Labs Review Labs Reviewed  CBC WITH DIFFERENTIAL - Abnormal; Notable for the following:    RBC 3.59 (*)    Hemoglobin 10.9 (*)    HCT 32.6 (*)    All other components within normal limits  COMPREHENSIVE METABOLIC PANEL - Abnormal; Notable for the  following:    Creatinine, Ser 1.13 (*)    Albumin 3.4 (*)    AST 57 (*)    ALT 42 (*)    GFR calc non Af Amer 56 (*)    GFR calc Af Amer 65 (*)    All other components within normal limits  URINALYSIS, ROUTINE W REFLEX MICROSCOPIC - Abnormal; Notable for the following:    APPearance CLOUDY (*)    Leukocytes, UA TRACE (*)    All other components within normal limits  URINE MICROSCOPIC-ADD ON - Abnormal; Notable for the following:    Squamous Epithelial / LPF FEW (*)    Bacteria, UA MANY (*)    All other components within normal limits  LIPASE, BLOOD    Imaging Review Ct Abdomen Pelvis W Contrast  03/04/2014   CLINICAL DATA:  Left lower quadrant abdominal pain for several days. History of diverticulitis.  EXAM: CT ABDOMEN AND PELVIS WITH CONTRAST  TECHNIQUE: Multidetector CT imaging of the abdomen and pelvis was performed using the standard protocol following bolus administration of intravenous contrast.  CONTRAST:  183mL OMNIPAQUE IOHEXOL 300 MG/ML  SOLN  COMPARISON:  08/10/2013  FINDINGS: The lung bases are clear.  The liver is mildly decreased in attenuation as can be seen with hepatic steatosis. There is no intrahepatic or extrahepatic biliary ductal dilatation. The gallbladder is surgically absent. The spleen demonstrates no focal abnormality. The kidneys, adrenal glands and pancreas are normal. The bladder is unremarkable.  The stomach, duodenum, small intestine, and large intestine demonstrate no contrast extravasation or dilatation. There is a large amount of stool throughout the colon. There is a normal caliber appendix in the right lower quadrant without periappendiceal inflammatory changes. There is no pneumoperitoneum, pneumatosis, or portal venous gas. There is no abdominal or pelvic free fluid. There is no lymphadenopathy. Prior hysterectomy.  The abdominal aorta is normal in caliber with atherosclerosis.  There are no lytic or sclerotic osseous lesions.  IMPRESSION: 1. Large amount  of stool throughout the colon.   Electronically Signed   By: Kathreen Devoid   On: 03/04/2014 19:45     EKG Interpretation None      MDM   Final diagnoses:  LLQ pain   KEITH CANCIO is a 49 y.o. female with H&P as above. HDS, NAD. Abdominal exam shows mild left lower quadrant TTP without signs of peritonitis. Patient's skin lesions are likely secondary to her lupus, porphyria and I do not suspect that this is shingles. Patient will get screening labs and a CT of her abdomen and pelvis for further evaluation. She was treated for symptoms on the ED. Screening labs unremarkable. CT shows large stool throughout the colon and no signs of active diverticulitis. On reevaluation a patient she reports feeling much better. She is currently requesting narcotic pain medication had a long discussion with patient about how counterintuitive this is. I have advised her to use MiraLAX and follow closely with her PCP. Strict return precautions have been discussed. Stable for discharge.  Clinical Impression: 1. Constipation, unspecified constipation type   2. LLQ pain     Disposition: Discharge  Condition: Good  I have discussed the results, Dx and Tx plan with the pt(& family if present). He/she/they expressed understanding and agree(s) with the plan. Discharge instructions discussed at great length. Strict return precautions discussed and pt &/or family have verbalized understanding of the instructions. No further questions at time of discharge.    Discharge Medication List as of 03/04/2014  8:36 PM    START taking these medications   Details  polyethylene glycol (MIRALAX / GLYCOLAX) packet Take 17 g by mouth 2 (two) times daily., Starting 03/04/2014, Until Discontinued, Print        Follow Up: [name], MD [place] [id number] [phone]   If not improved in 2 days  [place] [address] [id number] [place] [phone]  If symptoms worsen   Pt seen in conjunction with Dr. Filiberto Pinks, [facility] Resident - PGY-2       Kirstie Peri, MD 03/05/14 0005

## 2014-03-04 NOTE — ED Provider Notes (Signed)
.  I saw and evaluated the patient, reviewed the resident's note and I agree with the findings and plan.   EKG Interpretation   Date/Time:  Thursday March 04 2014 18:48:35 EST Ventricular Rate:  52 PR Interval:    QRS Duration: 85 QT Interval:  580 QTC Calculation: 539 R Axis:   71 Text Interpretation:  Sinus bradycardia Low voltage, extremity and  precordial leads Prolonged QT interval Confirmed by Shalayna Ornstein,  DO, Magalie Almon  [phone]) on 03/04/2014 6:58:27 PM      Pt is a 49 y.o. F with history of lupus, porphyria, hypothyroidism who presents emergency department with complaints of left lower quadrant pain for the past several days, nausea and very mild nonbloody diarrhea. States she's had diverticulitis in the past and this feels similar. She has had a low-grade temperature of 100. She is status post hysterectomy and cholecystectomy. On exam, patient is tender to palpation in her left lower quadrant with voluntary guarding but has a nonsurgical abdomen. She is hemodynamically stable. No fever here. Hemodynamically stable. Labs unremarkable. CT scan shows constipation. No diverticulitis or other acute abnormality. We'll discharge with instructions to use Metamucil, MiraLAX, Colace. Will not discharge him with narcotic pain medicine.  [place], DO 03/05/14 0008

## 2014-03-04 NOTE — ED Notes (Signed)
Has power port, wants labs drawn from this.

## 2014-03-04 NOTE — ED Notes (Signed)
MD at bedside. 

## 2014-03-04 NOTE — Discharge Instructions (Signed)

## 2014-03-25 ENCOUNTER — Ambulatory Visit: Payer: Self-pay | Admitting: Pain Medicine

## 2014-04-17 ENCOUNTER — Emergency Department (HOSPITAL_COMMUNITY)
Admission: EM | Admit: 2014-04-17 | Discharge: 2014-04-17 | Disposition: A | Discharge status: Home / Self Care | Payer: Medicare Other | Attending: Emergency Medicine | Admitting: Emergency Medicine

## 2014-04-17 ENCOUNTER — Encounter (HOSPITAL_COMMUNITY): Payer: Self-pay | Admitting: Nurse Practitioner

## 2014-04-17 DIAGNOSIS — M797 Fibromyalgia: Secondary | ICD-10-CM | POA: Insufficient documentation

## 2014-04-17 DIAGNOSIS — Z8719 Personal history of other diseases of the digestive system: Secondary | ICD-10-CM | POA: Diagnosis not present

## 2014-04-17 DIAGNOSIS — Z9889 Other specified postprocedural states: Secondary | ICD-10-CM | POA: Insufficient documentation

## 2014-04-17 DIAGNOSIS — Z3202 Encounter for pregnancy test, result negative: Secondary | ICD-10-CM | POA: Insufficient documentation

## 2014-04-17 DIAGNOSIS — G8929 Other chronic pain: Secondary | ICD-10-CM | POA: Diagnosis not present

## 2014-04-17 DIAGNOSIS — R109 Unspecified abdominal pain: Secondary | ICD-10-CM | POA: Diagnosis present

## 2014-04-17 DIAGNOSIS — E039 Hypothyroidism, unspecified: Secondary | ICD-10-CM | POA: Insufficient documentation

## 2014-04-17 DIAGNOSIS — Z79899 Other long term (current) drug therapy: Secondary | ICD-10-CM | POA: Diagnosis not present

## 2014-04-17 LAB — COMPREHENSIVE METABOLIC PANEL
ALT: 23 U/L (ref 0–35)
AST: 30 U/L (ref 0–37)
Albumin: 3.9 g/dL (ref 3.5–5.2)
Alkaline Phosphatase: 81 U/L (ref 39–117)
Anion gap: 7 (ref 5–15)
BILIRUBIN TOTAL: 0.6 mg/dL (ref 0.3–1.2)
BUN: 13 mg/dL (ref 6–23)
CHLORIDE: 107 mmol/L (ref 96–112)
CO2: 25 mmol/L (ref 19–32)
Calcium: 9.4 mg/dL (ref 8.4–10.5)
Creatinine, Ser: 1.08 mg/dL (ref 0.50–1.10)
GFR calc Af Amer: 68 mL/min — ABNORMAL LOW (ref >90)
GFR calc non Af Amer: 59 mL/min — ABNORMAL LOW (ref >90)
Glucose, Bld: 84 mg/dL (ref 70–99)
Potassium: 3.2 mmol/L — ABNORMAL LOW (ref 3.5–5.1)
SODIUM: 139 mmol/L (ref 135–145)
Total Protein: 7.2 g/dL (ref 6.0–8.3)

## 2014-04-17 LAB — CBC WITH DIFFERENTIAL/PLATELET
BASOS PCT: 1 % (ref 0–1)
Basophils Absolute: 0 10*3/uL (ref 0.0–0.1)
EOS ABS: 0.1 10*3/uL (ref 0.0–0.7)
Eosinophils Relative: 1 % (ref 0–5)
HEMATOCRIT: 34 % — AB (ref 36.0–46.0)
Hemoglobin: 11.4 g/dL — ABNORMAL LOW (ref 12.0–15.0)
Lymphocytes Relative: 31 % (ref 12–46)
Lymphs Abs: 1.6 10*3/uL (ref 0.7–4.0)
MCH: 29.9 pg (ref 26.0–34.0)
MCHC: 33.5 g/dL (ref 30.0–36.0)
MCV: 89.2 fL (ref 78.0–100.0)
MONOS PCT: 7 % (ref 3–12)
Monocytes Absolute: 0.4 10*3/uL (ref 0.1–1.0)
NEUTROS PCT: 60 % (ref 43–77)
Neutro Abs: 3.2 10*3/uL (ref 1.7–7.7)
Platelets: 189 10*3/uL (ref 150–400)
RBC: 3.81 MIL/uL — ABNORMAL LOW (ref 3.87–5.11)
RDW: 14.3 % (ref 11.5–15.5)
WBC: 5.3 10*3/uL (ref 4.0–10.5)

## 2014-04-17 LAB — URINALYSIS, ROUTINE W REFLEX MICROSCOPIC
BILIRUBIN URINE: NEGATIVE
Glucose, UA: NEGATIVE mg/dL
Hgb urine dipstick: NEGATIVE
KETONES UR: NEGATIVE mg/dL
Leukocytes, UA: NEGATIVE
Nitrite: NEGATIVE
PH: 6 (ref 5.0–8.0)
PROTEIN: NEGATIVE mg/dL
Specific Gravity, Urine: 1.008 (ref 1.005–1.030)
Urobilinogen, UA: 0.2 mg/dL (ref 0.0–1.0)

## 2014-04-17 LAB — POC URINE PREG, ED: PREG TEST UR: NEGATIVE

## 2014-04-17 LAB — LIPASE, BLOOD: Lipase: 29 U/L (ref 11–59)

## 2014-04-17 MED ORDER — TRAMADOL HCL 50 MG PO TABS: 50.0000 mg | ORAL_TABLET | Freq: Four times a day (QID) | ORAL | Status: DC | PRN

## 2014-04-17 MED ORDER — SODIUM CHLORIDE 0.9 % IJ SOLN: 10.0000 mL | Freq: Two times a day (BID) | INTRAMUSCULAR | Status: DC

## 2014-04-17 MED ORDER — ONDANSETRON HCL 4 MG/2ML IJ SOLN
4.0000 mg | Freq: Once | INTRAMUSCULAR | Status: AC
  Administered 2014-04-17: 4 mg via INTRAVENOUS
  Filled 2014-04-17: qty 2

## 2014-04-17 MED ORDER — FENTANYL CITRATE 0.05 MG/ML IJ SOLN
100.0000 ug | Freq: Once | INTRAMUSCULAR | Status: AC
  Administered 2014-04-17: 100 ug via INTRAVENOUS
  Filled 2014-04-17: qty 2

## 2014-04-17 MED ORDER — HYDROMORPHONE HCL 1 MG/ML IJ SOLN
1.0000 mg | Freq: Once | INTRAMUSCULAR | Status: AC
  Administered 2014-04-17: 1 mg via INTRAVENOUS
  Filled 2014-04-17: qty 1

## 2014-04-17 MED ORDER — SODIUM CHLORIDE 0.9 % IJ SOLN: 10.0000 mL | INTRAMUSCULAR | Status: DC | PRN

## 2014-04-17 MED ORDER — SODIUM CHLORIDE 0.9 % IV BOLUS (SEPSIS)
1000.0000 mL | Freq: Once | INTRAVENOUS | Status: AC
  Administered 2014-04-17: 1000 mL via INTRAVENOUS

## 2014-04-17 NOTE — Discharge Instructions (Signed)
Abdominal Pain, Women °Abdominal (stomach, pelvic, or belly) pain can be caused by many things. It is important to tell your doctor: °· The location of the pain. °· Does it come and go or is it present all the time? °· Are there things that start the pain (eating certain foods, exercise)? °· Are there other symptoms associated with the pain (fever, nausea, vomiting, diarrhea)? °All of this is helpful to know when trying to find the cause of the pain. °CAUSES  °· Stomach: virus or bacteria infection, or ulcer. °· Intestine: appendicitis (inflamed appendix), regional ileitis (Crohn's disease), ulcerative colitis (inflamed colon), irritable bowel syndrome, diverticulitis (inflamed diverticulum of the colon), or cancer of the stomach or intestine. °· Gallbladder disease or stones in the gallbladder. °· Kidney disease, kidney stones, or infection. °· Pancreas infection or cancer. °· Fibromyalgia (pain disorder). °· Diseases of the female organs: °¨ Uterus: fibroid (non-cancerous) tumors or infection. °¨ Fallopian tubes: infection or tubal pregnancy. °¨ Ovary: cysts or tumors. °¨ Pelvic adhesions (scar tissue). °¨ Endometriosis (uterus lining tissue growing in the pelvis and on the pelvic organs). °¨ Pelvic congestion syndrome (female organs filling up with blood just before the menstrual period). °¨ Pain with the menstrual period. °¨ Pain with ovulation (producing an egg). °¨ Pain with an IUD (intrauterine device, birth control) in the uterus. °¨ Cancer of the female organs. °· Functional pain (pain not caused by a disease, may improve without treatment). °· Psychological pain. °· Depression. °DIAGNOSIS  °Your doctor will decide the seriousness of your pain by doing an examination. °· Blood tests. °· X-rays. °· Ultrasound. °· CT scan (computed tomography, special type of X-ray). °· MRI (magnetic resonance imaging). °· Cultures, for infection. °· Barium enema (dye inserted in the large intestine, to better view it with  X-rays). °· Colonoscopy (looking in intestine with a lighted tube). °· Laparoscopy (minor surgery, looking in abdomen with a lighted tube). °· Major abdominal exploratory surgery (looking in abdomen with a large incision). °TREATMENT  °The treatment will depend on the cause of the pain.  °· Many cases can be observed and treated at home. °· Over-the-counter medicines recommended by your caregiver. °· Prescription medicine. °· Antibiotics, for infection. °· Birth control pills, for painful periods or for ovulation pain. °· Hormone treatment, for endometriosis. °· Nerve blocking injections. °· Physical therapy. °· Antidepressants. °· Counseling with a psychologist or psychiatrist. °· Minor or major surgery. °HOME CARE INSTRUCTIONS  °· Do not take laxatives, unless directed by your caregiver. °· Take over-the-counter pain medicine only if ordered by your caregiver. Do not take aspirin because it can cause an upset stomach or bleeding. °· Try a clear liquid diet (broth or water) as ordered by your caregiver. Slowly move to a bland diet, as tolerated, if the pain is related to the stomach or intestine. °· Have a thermometer and take your temperature several times a day, and record it. °· Bed rest and sleep, if it helps the pain. °· Avoid sexual intercourse, if it causes pain. °· Avoid stressful situations. °· Keep your follow-up appointments and tests, as your caregiver orders. °· If the pain does not go away with medicine or surgery, you may try: °¨ Acupuncture. °¨ Relaxation exercises (yoga, meditation). °¨ Group therapy. °¨ Counseling. °SEEK MEDICAL CARE IF:  °· You notice certain foods cause stomach pain. °· Your home care treatment is not helping your pain. °· You need stronger pain medicine. °· You want your IUD removed. °· You feel faint or   lightheaded. °· You develop nausea and vomiting. °· You develop a rash. °· You are having side effects or an allergy to your medicine. °SEEK IMMEDIATE MEDICAL CARE IF:  °· Your  pain does not go away or gets worse. °· You have a fever. °· Your pain is felt only in portions of the abdomen. The right side could possibly be appendicitis. The left lower portion of the abdomen could be colitis or diverticulitis. °· You are passing blood in your stools (bright red or black tarry stools, with or without vomiting). °· You have blood in your urine. °· You develop chills, with or without a fever. °· You pass out. °MAKE SURE YOU:  °· Understand these instructions. °· Will watch your condition. °· Will get help right away if you are not doing well or get worse. °Document Released: 11/26/2006 Document Revised: 06/15/2013 Document Reviewed: 12/16/2008 °[employer]® Patient Information ©2015 [employer]. This information is not intended to replace advice given to you by your health care provider. Make sure you discuss any questions you have with your health care provider. ° °

## 2014-04-17 NOTE — ED Notes (Signed)
She c/o abd pain since this morning. She also reports chills, nausea. The pain was worse after eating and when she had a BM. She has hx ulcerative colitis and has been hospitalized multiple times for it. She normally takes tramadol at home for the pain but she ran out

## 2014-04-17 NOTE — ED Notes (Signed)
IV Team at bedside 

## 2014-04-17 NOTE — ED Provider Notes (Signed)
CSN: [id number]     Arrival date & time 04/17/14  1520 History   First MD Initiated Contact with Patient 04/17/14 1729     Chief Complaint  Patient presents with  . Abdominal Pain     (Consider location/radiation/quality/duration/timing/severity/associated sxs/prior Treatment) HPI Adrienne Flores is a 49 y.o. female history of lupus, porphyria, UC, RA, fibromyalgia who comes in for evaluation of abdominal discomfort. Patient states yesterday at lunchtime she ate a [place] and provolone sandwich and 20 minutes later she began to experience a "tearing abdominal pain". This pain is exacerbated with eating and having a bowel movement. Tramadol usually helps this discomfort, but she has run out of this medication taking her last dose on Thursday. She does report taking oxycodone today and yesterday with only minimal relief. She also reports associated nausea without vomiting. She denies fevers, diarrhea, dark stools, urinary symptoms, vaginal bleeding or discharge. Patient does report last month she was hospitalized for ulcerative colitis at [facility]. She reports having a follow-up appointment with her GI doctor on March 17 at [facility]. Of note, patient also has a surgical history of total abdominal hysterectomy, cholecystectomy and C-section.  Past Medical History  Diagnosis Date  . Rheumatoid arthritis   . Gastroparesis   . Degenerative disc disease   . Fibromyalgia   . Diverticulitis   . Thyroid disease   . Lupus   . Porphyria   . Ulcerative colitis    Past Surgical History  Procedure Laterality Date  . Cesarean section    . Replacement total knee Bilateral   . Abdominal hysterectomy    . Cholecystectomy     Family History  Problem Relation Age of Onset  . Leukemia Mother    History  Substance Use Topics  . Smoking status: Never Smoker   . Smokeless tobacco: Not on file  . Alcohol Use: Yes     Comment: social   OB History    No data available     Review of Systems A  10 point review of systems was completed and was negative except for pertinent positives and negatives as mentioned in the history of present illness     Allergies  Methotrexate derivatives; Morphine and related; and Relafen  Home Medications   Prior to Admission medications   Medication Sig Start Date End Date Taking? Authorizing Provider  amitriptyline (ELAVIL) 75 MG tablet Take 75 mg by mouth at bedtime.   Yes Historical Provider, MD  gabapentin (NEURONTIN) 600 MG tablet Take 600 mg by mouth 3 (three) times daily.   Yes Historical Provider, MD  levothyroxine (SYNTHROID, LEVOTHROID) 25 MCG tablet Take 25 mcg by mouth daily before breakfast. Take with 37mcg to = 391mcg total   Yes Historical Provider, MD  levothyroxine (SYNTHROID, LEVOTHROID) 300 MCG tablet Take 300 mcg by mouth daily before breakfast. Take with a 69mcg tablet to = 368mcg   Yes Historical Provider, MD  meclizine (ANTIVERT) 25 MG tablet Take 1 tablet (25 mg total) by mouth 3 (three) times daily as needed. Patient taking differently: Take 25 mg by mouth 3 (three) times daily as needed for dizziness or nausea.  09/11/13  Yes Resa Miner Lawyer, PA-C  metoCLOPramide (REGLAN) 10 MG tablet Take 1 tablet (10 mg total) by mouth 4 (four) times daily. 05/03/13  Yes Carlisle Cater, PA-C  ondansetron (ZOFRAN ODT) 4 MG disintegrating tablet Take 1 tablet (4 mg total) by mouth every 8 (eight) hours as needed for nausea or vomiting. 03/19/13  Yes  Harvie Heck, PA-C  oxyCODONE (OXY IR/ROXICODONE) 5 MG immediate release tablet Take 5 mg by mouth 2 (two) times daily. Take everyday per patient   Yes Historical Provider, MD  polyethylene glycol (MIRALAX / GLYCOLAX) packet Take 17 g by mouth 2 (two) times daily. Patient taking differently: Take 17 g by mouth daily as needed for moderate constipation.  03/04/14  Yes Kirstie Peri, MD  tizanidine (ZANAFLEX) 2 MG capsule Take 2-4 mg by mouth 3 (three) times daily as needed for muscle spasms.  09/29/12   Yes Historical Provider, MD  oxyCODONE-acetaminophen (PERCOCET/ROXICET) 5-325 MG per tablet Take 1 tablet by mouth every 6 (six) hours as needed for severe pain. 09/11/13   Resa Miner Lawyer, PA-C  promethazine (PHENERGAN) 25 MG suppository Place 1 suppository (25 mg total) rectally every 6 (six) hours as needed for nausea or vomiting. You may place vaginally as well. 09/27/13   Jennifer L Piepenbrink, PA-C  traMADol (ULTRAM) 50 MG tablet Take 1 tablet (50 mg total) by mouth every 6 (six) hours as needed. 04/17/14   Viona Gilmore Lamonica Trueba, PA-C   BP 133/79 mmHg  Pulse 53  Temp(Src) 98.3 F (36.8 C) (Oral)  Resp 17  SpO2 100% Physical Exam  Constitutional: She is oriented to person, place, and time. She appears well-developed and well-nourished.  HENT:  Head: Normocephalic and atraumatic.  Mouth/Throat: Oropharynx is clear and moist.  Eyes: Conjunctivae are normal. Pupils are equal, round, and reactive to light. Right eye exhibits no discharge. Left eye exhibits no discharge. No scleral icterus.  Neck: Normal range of motion. Neck supple.  Cardiovascular: Normal rate, regular rhythm and normal heart sounds.   Pulmonary/Chest: Effort normal and breath sounds normal. No respiratory distress. She has no wheezes. She has no rales.  Abdominal: Soft. Bowel sounds are normal.  Mild diffuse abdominal tenderness with no focal tenderness. Abdomen is nondistended. No rebound or guarding. No evidence of acute or surgical abdomen. Small maculopapular lesions in multiple states of healing with excoriations--also noted to bilateral upper extremities  Musculoskeletal: She exhibits no tenderness.  Neurological: She is alert and oriented to person, place, and time.  Cranial Nerves II-XII grossly intact  Skin: Skin is warm and dry. No rash noted.  Psychiatric: She has a normal mood and affect.  Nursing note and vitals reviewed.   ED Course  Procedures (including critical care time) Labs Review Labs Reviewed   CBC WITH DIFFERENTIAL/PLATELET - Abnormal; Notable for the following:    RBC 3.81 (*)    Hemoglobin 11.4 (*)    HCT 34.0 (*)    All other components within normal limits  COMPREHENSIVE METABOLIC PANEL - Abnormal; Notable for the following:    Potassium 3.2 (*)    GFR calc non Af Amer 59 (*)    GFR calc Af Amer 68 (*)    All other components within normal limits  URINALYSIS, ROUTINE W REFLEX MICROSCOPIC  LIPASE, BLOOD  POC URINE PREG, ED    Imaging Review No results found.   EKG Interpretation None     Meds given in ED:  Medications  sodium chloride 0.9 % injection 10-40 mL (not administered)  sodium chloride 0.9 % injection 10-40 mL (not administered)  fentaNYL (SUBLIMAZE) injection 100 mcg (100 mcg Intravenous Given 04/17/14 1841)  sodium chloride 0.9 % bolus 1,000 mL (0 mLs Intravenous Stopped 04/17/14 1935)  ondansetron (ZOFRAN) injection 4 mg (4 mg Intravenous Given 04/17/14 1841)  HYDROmorphone (DILAUDID) injection 1 mg (1 mg Intravenous Given 04/17/14 2024)  New Prescriptions   TRAMADOL (ULTRAM) 50 MG TABLET    Take 1 tablet (50 mg total) by mouth every 6 (six) hours as needed.   Filed Vitals:   04/17/14 2015 04/17/14 2030 04/17/14 2059 04/17/14 2130  BP: 131/85 140/83 135/98 133/79  Pulse: 55 54 60 53  Temp:   98.3 F (36.8 C)   TempSrc:   Oral   Resp:  16 16 17   SpO2: 100% 100% 100% 100%    MDM  Vitals stable - WNL -afebrile Pt resting comfortably in ED. reports her pain is much better after analgesia administered in ED. PE--benign abdominal exam. No evidence of acute or surgical abdomen Labwork noncontributory Imaging-a normal abdominal CT completed on 1/21  DDX--Will treat patient symptomatically. Encourage follow-up with her GI. Will DC with short course pain medicine, reports that she still has oral and rectal suppositories for nausea at home. At this time I do not feel that there is an emergent or acute pathology that requires immediate intervention. I  feel this patient is stable to follow-up with her gastroenterologist for her regularly scheduled appointment on March 17  I discussed all relevant lab findings and imaging results with pt and they verbalized understanding. Discussed f/u with PCP within 48 hrs and return precautions, pt very amenable to plan.  Prior to patient discharge, I discussed and reviewed this case with Dr. Tawnya Crook     Final diagnoses:  Chronic abdominal pain       Viona Gilmore [place], PA-C 04/17/14 2152  Ernestina Patches, MD 04/18/14 [phone]

## 2014-04-17 NOTE — ED Notes (Signed)
Pt has port-a-cath and would prefer labs to be drawn from it

## 2014-04-17 NOTE — ED Notes (Signed)
Pt is in stable condition and ambulates from ED. 

## 2014-04-17 NOTE — ED Notes (Signed)
Pt ready to d/c, but must deaccess port a cath. IV team consult placed.

## 2014-04-22 ENCOUNTER — Ambulatory Visit: Payer: Self-pay | Admitting: Pain Medicine

## 2014-04-25 IMAGING — CT CT ABD-PELV W/ CM
2 of 5 series · 16 of 46 positions shown, 18 images · IV contrast (isovue)
Comparison: CT ABD/PELV WO CM dated 03/19/2013

CLINICAL DATA: Lower abdominal pain, vomiting

EXAM:
CT ABDOMEN AND PELVIS WITH CONTRAST
TECHNIQUE: Multidetector CT imaging of the abdomen and pelvis was performed
using the standard protocol following bolus administration of
intravenous contrast.
CONTRAST:  125 cc Isovue 370 IV

[Series 2: routine abd pel with · axial · 0.65mm/px · z∈[-992,-592]mm · 13 of 90 slices shown, 15 images]
[im 5/90  soft-tissue]
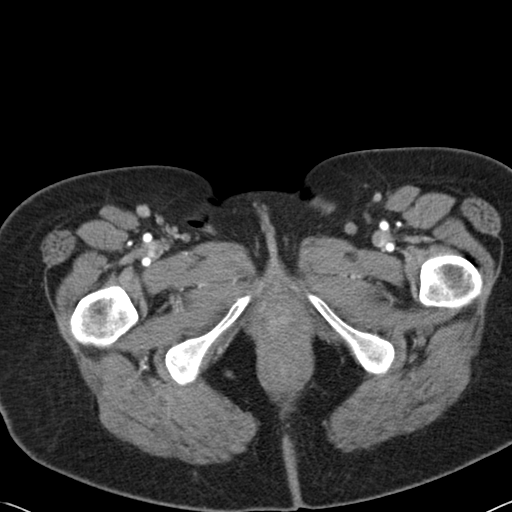
[im 5/90  bone]
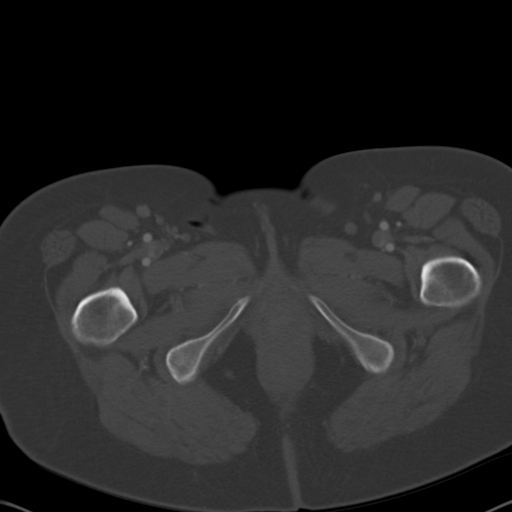
[im 10/90  soft-tissue]
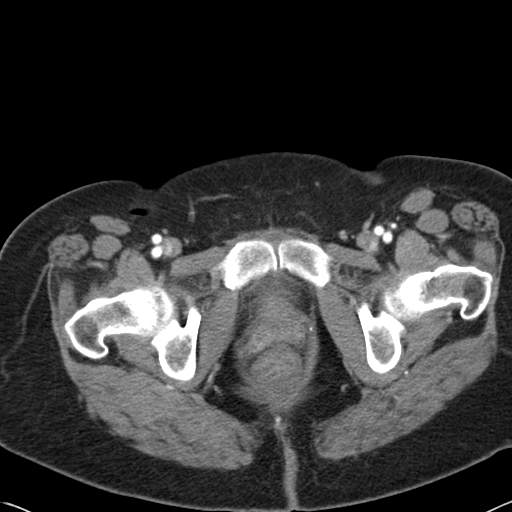
[im 20/90  soft-tissue]
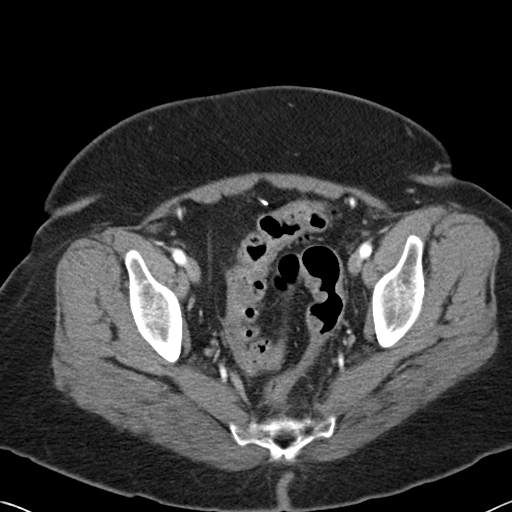
[im 25/90  soft-tissue]
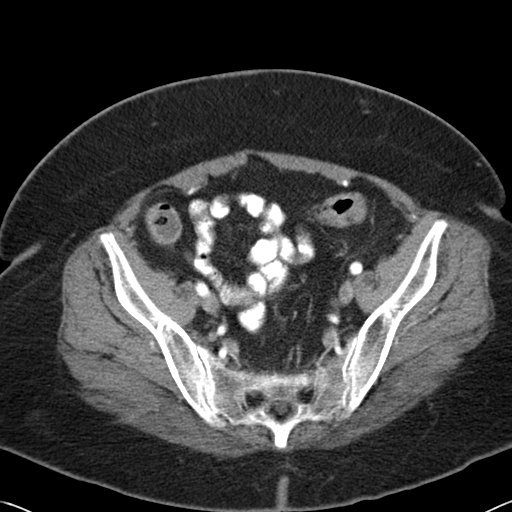
[im 30/90  soft-tissue]
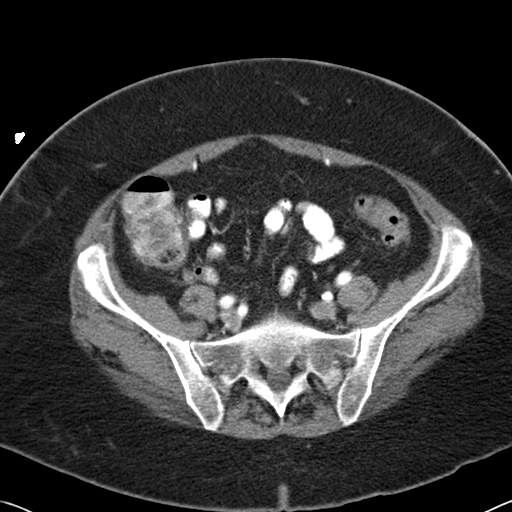
[im 40/90  soft-tissue]
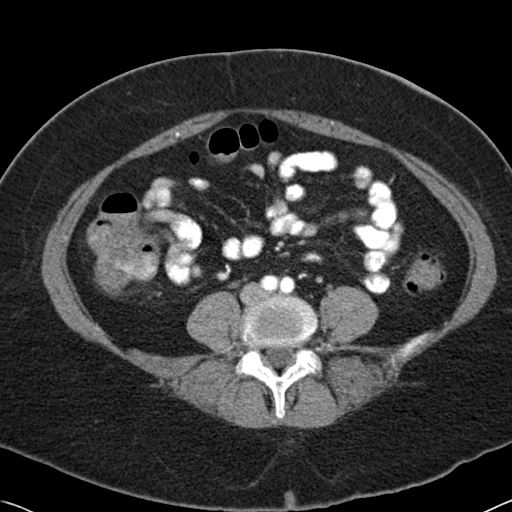
[im 45/90  soft-tissue]
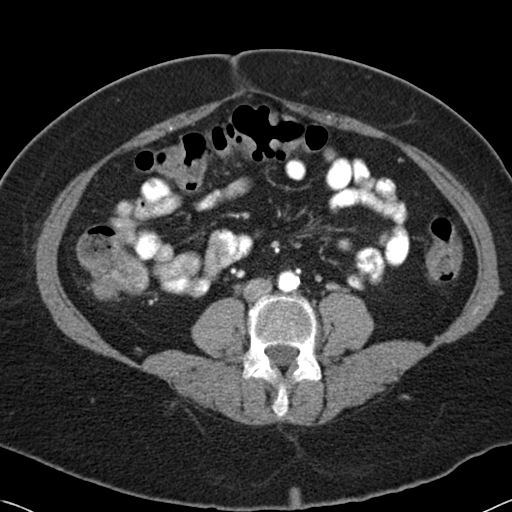
[im 50/90  soft-tissue]
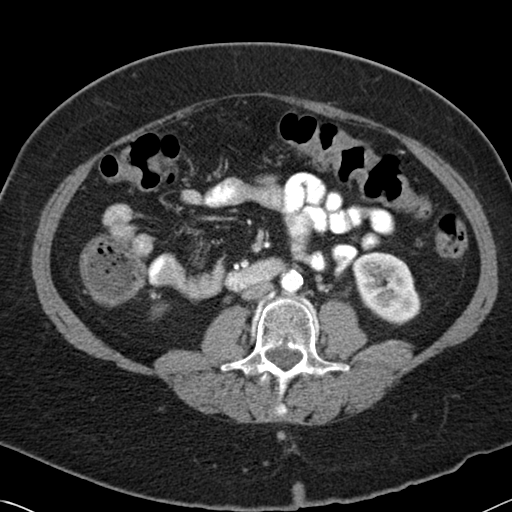
[im 60/90  soft-tissue]
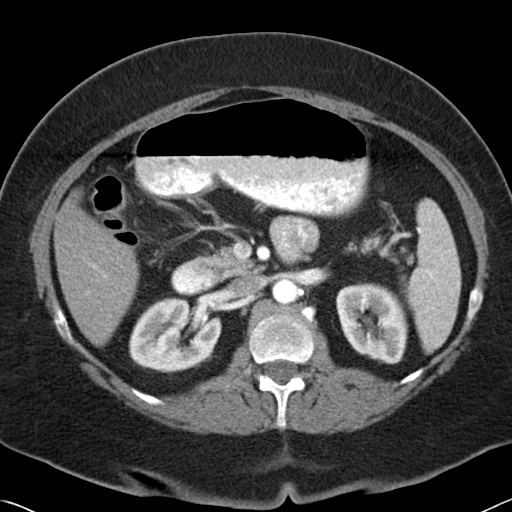
[im 60/90  bone]
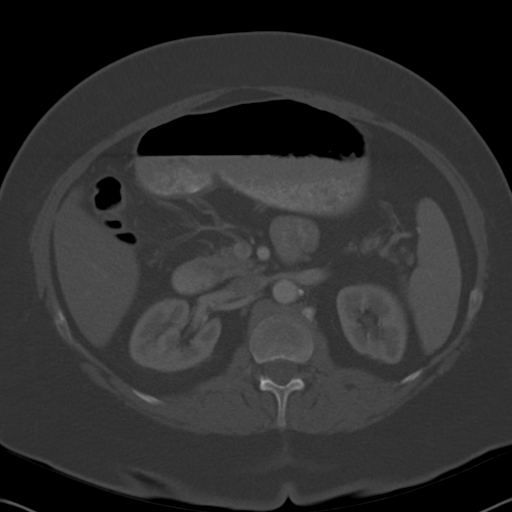
[im 65/90  soft-tissue]
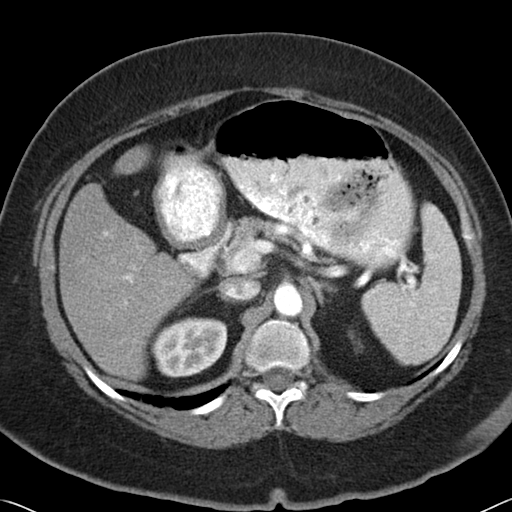
[im 70/90  soft-tissue]
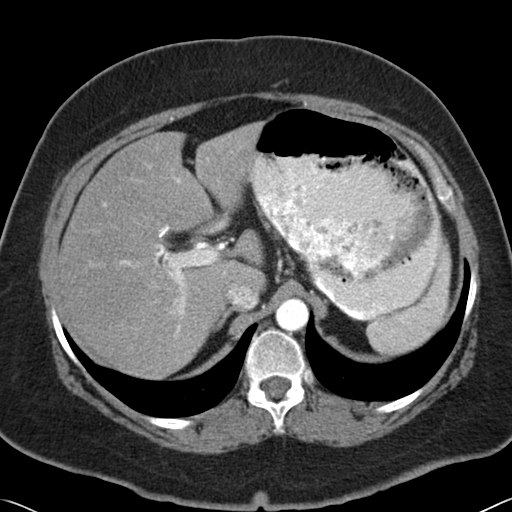
[im 80/90  soft-tissue]
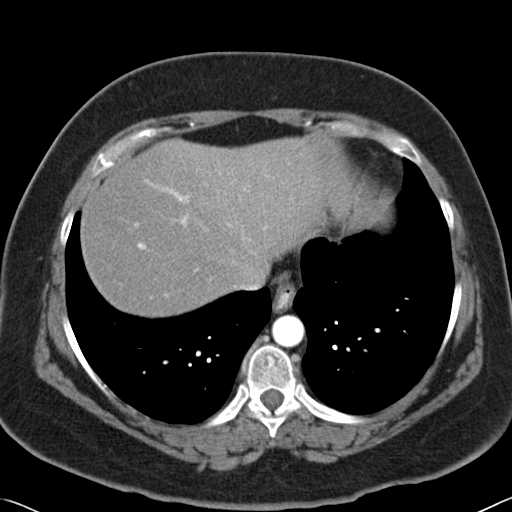
[im 85/90  soft-tissue]
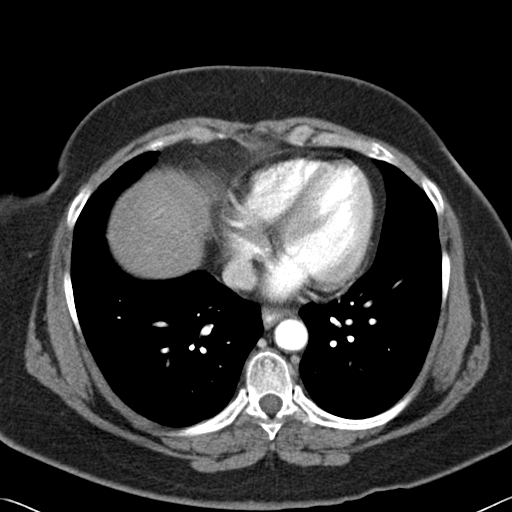

[Series 5: cor routine abd pel with · coronal · 0.66mm/px · 3 of 150 slices shown]
[im 50/150  soft-tissue]
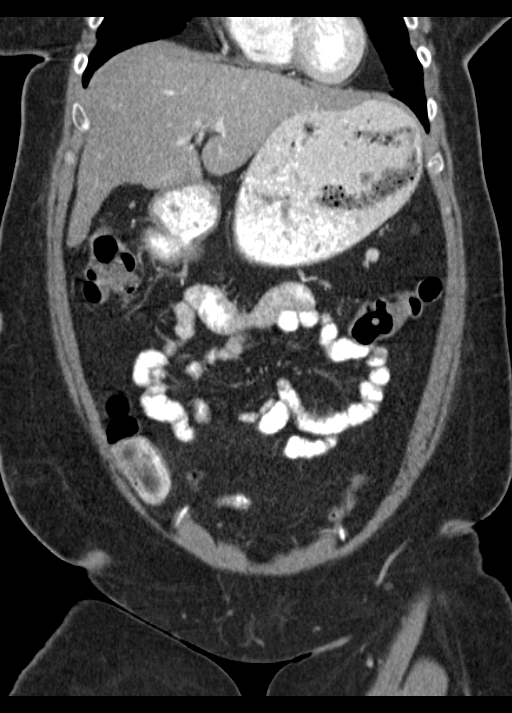
[im 67/150  soft-tissue]
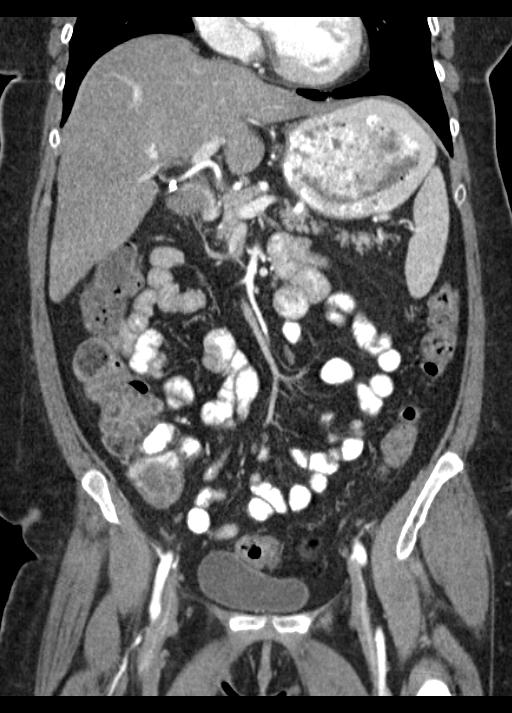
[im 83/150  soft-tissue]
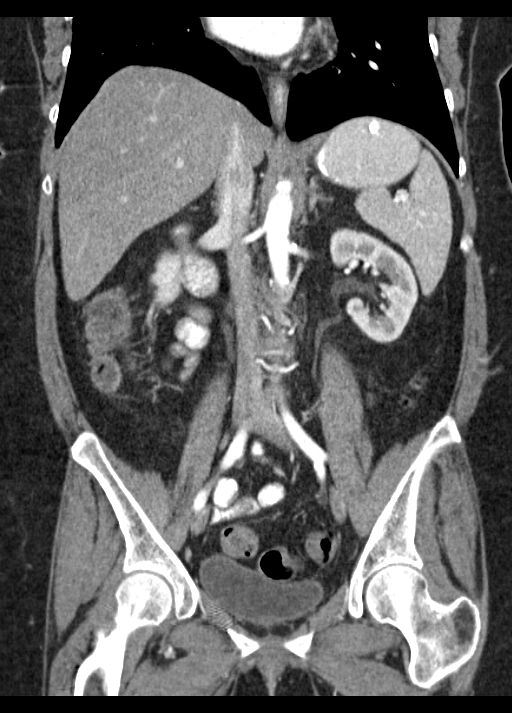

[16 of 46 positions shown; findings below may reference images not displayed]

FINDINGS: Lung bases are clear. No effusions. Heart is normal size.

Diffuse fatty infiltration of the liver. Prior cholecystectomy.
Spleen, pancreas, adrenals and kidneys are unremarkable. Previously
seen abnormal first and second portions of the duodenum appear
improved. The stomach is mildly distended. The antral region of the
stomach appears to have slight wall thickening given the degree of
distention. This could reflect mild gastritis appear

Prior hysterectomy. No adnexal masses. Urinary bladder is
unremarkable.

Remainder the small bowel is decompressed. Colon grossly
unremarkable. No free fluid, free air or adenopathy.

No acute bony abnormality.  Aorta is normal caliber.
IMPRESSION: Mild distention of the stomach. Slight wall thickening in the distal
stomach may reflect mild gastritis.

Diffuse fatty infiltration of the liver.

## 2014-05-19 ENCOUNTER — Ambulatory Visit
Admit: 2014-05-19 | Disposition: A | Discharge status: Home / Self Care | Payer: Self-pay | Attending: Pain Medicine | Admitting: Pain Medicine

## 2014-06-05 NOTE — Op Note (Signed)
PATIENT NAME:  Adrienne Flores, Adrienne Flores MR#:  [id number] DATE OF BIRTH:  06-06-1965  DATE OF PROCEDURE:  06/15/2013  PREOPERATIVE DIAGNOSES:   1.  Rheumatoid arthritis. 2.  Poor venous access.  POSTOPERATIVE DIAGNOSES:  1.  Rheumatoid arthritis. 2.  Poor venous access.  PROCEDURES:  1.  Ultrasound guidance for vascular access, right internal jugular vein.  2.  Fluoroscopic guidance for placement of catheter.  3.  Placement of CT compatible Port-A-Cath, right internal jugular vein.   SURGEON:  Leotis Pain, MD  ANESTHESIA:  Local with moderate conscious sedation.   FLUOROSCOPY TIME:  Less than 1 minute.   CONTRAST:  Zero.   ESTIMATED BLOOD LOSS:  Minimal.   INDICATION FOR PROCEDURE:  A 49 year old female who has very poor venous access with multiple hospitalizations and emergency room visits. She has severe rheumatoid arthritis and gets treatment for that as well. We are asked to place a Port-A- Cath for durable venous access. Risks and benefits were discussed. Informed consent was obtained.   DESCRIPTION OF THE PROCEDURE:  The patient was brought to the vascular and interventional radiology suite. The right neck and chest were sterilely prepped and draped, and a sterile surgical field was created. Ultrasound was used to help visualize a patent right internal jugular vein. This was then accessed under direct ultrasound guidance without difficulty with a Seldinger needle and a permanent image was recorded. A J-wire was placed. After skin nick and dilatation, the peel-away sheath was then placed over the wire. I then anesthetized an area under the clavicle approximately 2 fingerbreadths. A transverse incision was created and an inferior pocket was created with electrocautery and blunt dissection. The port was then brought onto the field, placed into the pocket and secured to the chest wall with 2 Prolene sutures. The catheter was connected to the port and tunneled from the subclavicular incision to  the access site. Fluoroscopic guidance was used to cut the catheter to an appropriate length. The catheter was then placed through the peel-away sheath and the peel-away sheath was removed. The catheter tip was parked in excellent location in the cavoatrial junction. The pocket was then irrigated with antibiotic-impregnated saline and the wound was closed with a running 3-0 Vicryl and a 4-0 Monocryl. The access incision was closed with a single 4-0 Monocryl. The Huber needle was used to withdraw blood and flush the port with heparinized saline. Dermabond was then placed as a dressing. The patient tolerated the procedure well and was taken to the recovery room in stable condition.   ____________________________ Algernon Huxley, MD jsd:sb D: 06/15/2013 10:48:12 ET T: 06/15/2013 11:04:53 ET JOB#: [id number]  cc: Algernon Huxley, MD, <Dictator> Algernon Huxley MD ELECTRONICALLY SIGNED 06/18/2013 14:24

## 2014-06-05 NOTE — Discharge Summary (Signed)
PATIENT NAME:  Adrienne Flores, Adrienne Flores MR#:  [id number] DATE OF BIRTH:  [birth year]  DATE OF ADMISSION:  03/25/2013 DATE OF DISCHARGE:  03/25/2013  The patient left AGAINST MEDICAL ADVICE.   CHIEF COMPLAINT: Left upper chest pain with right arm redness after cat scratch.   DISCHARGE DIAGNOSIS: Appears preliminary since the patient left AGAINST MEDICAL ADVICE.   DISCHARGE DIAGNOSES: Primary since the patient left AGAINST MEDICAL ADVICE:  1. Right upper extremity cellulitis.  2. Left upper chest pain.  3. Chronic pain syndrome.  4. Rheumatoid arthritis.  5. Fibromyalgia.   [name] is a 49 year old Caucasian female who came into the Emergency Room with left upper chest discomfort along with right upper extremity redness. She was admitted with chest pain, rule out. Her 2 sets of cardiac enzymes remained negative. EKG did not show any acute changes. Her blood pressure remained stable. The patient was also started on IV Unasyn for the her right upper extremity cellulitis secondary to cat scratch. The patient thereafter went on the floor decided to leave [name].   TIME SPENT: 35 minutes.  ____________________________ Hart Rochester Posey Pronto, MD sap:sg D: 03/26/2013 12:49:16 ET T: 03/26/2013 13:30:27 ET JOB#: [id number]  cc: [name], MD, <Dictator> Ilda Basset MD ELECTRONICALLY SIGNED 04/02/2013 17:07

## 2014-06-05 NOTE — H&P (Signed)
PATIENT NAME:  Adrienne Flores, Adrienne Flores MR#:  [id number] DATE OF BIRTH:  02/12/1966  DATE OF ADMISSION:  03/25/2013  PRIMARY CARE PHYSICIAN: Adrienne Perna, MD, at [facility].  CHIEF COMPLAINT: Left upper chest pain along with right arm redness after cat scratch.   HISTORY OF PRESENT ILLNESS: Adrienne Flores is a 49 year old Caucasian female with past medical history of lupus, diverticulitis, chronic back pain, who follows at pain clinic, history of osteoporosis, hypothyroidism, depressive disorder and history of fibromyalgia, who comes to the Emergency Room after she started having some left upper chest pain radiating to the arm. The patient said it woke her up in the middle of the night; however, her pain is gone. She did not take anything for pain. She received aspirin in the Emergency Room. She was found to be mildly tachycardic with heart rate in the 90s to upper 100s. She is currently hemodynamically stable. Blood pressure is stable. Her first set of cardiac enzymes are negative. EKG did not show any acute changes. She does not have history of coronary artery disease. She is being admitted for further evaluation and management.   The patient has developed a scratch from her cat, when her cat jumped on her right arm yesterday and started developing redness in the right arm, with some redness with some pain and "stretch-like feeling." She received IV dose of Unasyn. She has developed some cellulitis of the right upper extremity. Right upper extremity Doppler ultrasound was negative.   PAST MEDICAL HISTORY: As listed. 1. 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  2. Fibromyalgia.  3. Rheumatoid arthritis.  4. Right knee replacement.  5. Left knee surgery.  6. Gallbladder removal.  7. C-section.  8. Left total knee replacement.  9. Hysterectomy.  10. C-section.  11. Depression.  12. History of chickenpox.  13. DJD.  14. Hemorrhoids.  15. Hypothyroidism.  16. Osteoporosis.  17. Chronic back  pain, on pain medications through the pain clinic with Dr. Primus Bravo.  18. History of lupus.    ALLERGIES: METHOTREXATE, MORPHINE AND RELAFEN.   HOME MEDICATIONS:  1. Zanaflex 2 capsules 2 mg 1 to 2 times a day as needed.  2. Synthroid 325 mcg p.o. daily.  3. Reglan 10 mg 4 times a day.  4. Oxycodone 5 mg 1 tablet 4 to 6 times a day as needed.  5. Gabapentin 600 mg t.i.d.  6. Docusate 1 capsule b.i.d. 7. Alprazolam 0.5 mg 3 times a day.   SOCIAL HISTORY: She is married, lives with her husband at home. Nonsmoker.   FAMILY HISTORY: Uncle had coronary artery disease. The patient's sister has CAD as well.   REVIEW OF SYSTEMS:  CONSTITUTIONAL: Positive for weakness, fatigue. No fever.  EYES: No blurred or double vision, glaucoma or cataracts.  ENT: No tinnitus, ear pain, sinusitis or congestion.  RESPIRATORY: No cough, wheeze, hemoptysis or shortness of breath.  CARDIOVASCULAR: Positive for mild left upper chest pain. No hypertension. No arrhythmia or palpitations.  GASTROINTESTINAL: No nausea, vomiting, diarrhea, abdominal pain. Positive for some GERD-like symptoms.  GENITOURINARY: No hematuria, dysuria or frequency.  ENDOCRINE: No polyuria, nocturia or thyroid problems.  HEMATOLOGY: No anemia or easy bruising or bleeding disorder.  SKIN: No acne, rash. Positive for cellulitis on the right upper extremity.  MUSCULOSKELETAL: Positive for back pain and arthritis.  NEUROLOGIC: No numbness, epilepsy, tremors.  PSYCHIATRIC: Positive for depression. No anxiety or insomnia.  All other systems reviewed and negative.   PHYSICAL EXAMINATION:  GENERAL: The patient is awake, alert,  oriented x3, not in acute distress.  VITAL SIGNS: Afebrile. Pulse is 89, blood pressure is 122/77, saturations are 99% on room air.  HEENT: Atraumatic, normocephalic. PERRLA. EOMI intact. Oral mucosa is moist.  NECK: Supple. No JVD. No carotid bruit.  RESPIRATORY: Clear to auscultation bilaterally. No rales, rhonchi,  respiratory distress or labored breathing.  CARDIOVASCULAR: Both the heart sounds are normal. Mild tachycardia. No murmur heard. PMI not lateralized.  CHEST: Nontender.  EXTREMITIES: Good pedal pulses, good femoral pulses. No lower extremity edema.  ABDOMEN: Obese, soft, nontender. No organomegaly. Positive bowel sounds.  NEUROLOGIC: Grossly intact cranial nerves II through XII. No motor or sensory deficit.  PSYCHIATRIC: The patient is awake, alert, oriented x3. She does have chronic depression and anxiety.   DIAGNOSTIC STUDIES:  EKG shows sinus tachycardia. No acute EKG ST elevation or depression.  Ultrasound Doppler of the upper extremity: No DVT.  Chest x-ray: No active disease.  White count is 9.4, H and H is 17.3 and 50.9.  Glucose is 103, BUN is 10, creatinine is 1.36, sodium 132, potassium 4.0, chloride is 101, bicarbonate is 21.  UA: Negative for UTI.   ASSESSMENT AND PLAN: The 49 year old [name] with history of chronic pain syndrome, chronic fibromyalgia, rheumatoid arthritis and diverticulitis, comes in with:   1. Right upper extremity cellulitis after cat scratch yesterday. The patient's white count is normal. Will admit the patient for overnight observation. She is already started on Unasyn 3 grams q.8 hours, which I will continue. If she remains afebrile and blood culture remains negative, change it to p.o. Augmentin for 10 days.  2. Chest pain, left upper, with mild radiation to the left arm, resolved. Appears atypical at this time. The patient does not have any cardiac history. She does have positive family history. Denies any history of hyperlipidemia as well. Will cycle cardiac enzymes x3, give aspirin and p.r.n. nitroglycerin as needed. Consider cardiology consultation if the patient's symptoms continue. The patient does have a lot of fibromyalgia symptoms as well. This could be part of her fibromyalgia as well. Echo was done in December 2013, and it showed mild left  ventricular hypertrophy with no wall motion abnormality. Ejection fraction was normal. I will not repeat the echo at this time.  3. Hypothyroidism. Continue Synthroid.  4. Chronic pain syndrome. Continue oxycodone.  5. Deep vein thrombosis prophylaxis. Will give subcutaneous heparin.  6. Anxiety, depression. Continue the patient's alprazolam.  7. Further workup according to the patient's clinical course. Hospital admission plan was discussed with the patient and the patient's husband, who are agreeable to it.   TIME SPENT: 50 minutes.   ____________________________ Hart Rochester Posey Pronto, MD sap:lb D: 03/25/2013 10:29:17 ET T: 03/25/2013 10:46:50 ET JOB#: [id number]  cc: Santhosh Gulino A. Posey Pronto, MD, <Dictator> Adrienne Perna, MD Ilda Basset MD ELECTRONICALLY SIGNED 03/26/2013 13:28

## 2014-06-06 NOTE — Consult Note (Signed)
Brief Consult Note: Diagnosis: Recently diagnosed with Gastroparesis. Gastric emptying study was performed with patient being on chronic narcotic use thus possible false positive results as well as current guidelines recommend gastric emptying study guidelines recommend study over 4 hours.  Nausea, vomiting and abdominal pain.  Abdominal pain bilaterally to upper abdomen.  Weight loss.   Discussed with Attending MD.   Comments: Patient's presentation discussed with Dr. Verdie Shire.  Recommendation is for gastric emptying study to be repeated with patient being off of narcotic therapy for at least 48 hours prior to study.  Will continue to monitor.  Continue anit-emetic therapy.  Electronic Signatures: Payton Emerald (NP)  (Signed 20-Mar-13 16:14)  Authored: Brief Consult Note   Last Updated: 20-Mar-13 16:14 by Payton Emerald (NP)

## 2014-06-06 NOTE — Consult Note (Signed)
Chief Complaint:   Subjective/Chief Complaint some nausea today no diarrhea, mild to moderate luq/llq abdominal pain   VITAL SIGNS/ANCILLARY NOTES: **Vital Signs.:   08-Feb-13 17:29   Vital Signs Type Q 4hr   Temperature Temperature (F) 98   Celsius 36.6   Pulse Pulse 90   Respirations Respirations 18   Systolic BP Systolic BP 875   Diastolic BP (mmHg) Diastolic BP (mmHg) 76   Mean BP 92   Pulse Ox % Pulse Ox % 96   Pulse Ox Activity Level  At rest   Oxygen Delivery Room Air/ 21 %   Brief Assessment:   Cardiac Regular    Respiratory clear BS    Gastrointestinal details normal Soft  Nondistended  No masses palpable  Bowel sounds normal  mild pain/discomfort llq luq   Cardiology:  08-Feb-13 13:46    Ventricular Rate 76   Atrial Rate 76   P-R Interval 186   QRS Duration 86   QT 358   QTc 402   P Axis 40   R Axis -2   T Axis -12  Routine Hem:  08-Feb-13 14:25    Hemoglobin (CBC) 11.7   Radiology Results: Nuclear Med:    08-Feb-13 12:40, Gastric Emptying Study - Nuc Med   Gastric Emptying Study - Nuc Med    REASON FOR EXAM:    Nausea, chronic gabapentin use, hypothyroidism,   possible gastroparesis  COMMENTS:       PROCEDURE: NM  - NM GASTRIC EMPTYING STUDY  - Mar 23 2011 12:40PM     RESULT: Following oral administration of 2.17 mCi technetium 12m sulfur   colloid administered in an egg sandwich with 8 ounces of water there is   observed 9% gastric emptying at 95 minutes. This value is in the markedly   hypokinetic range.    IMPRESSION:   1. Abnormal gastric emptying study. Gastric emptying measures9%, which   is well below the normal range.  2. No gastroesophageal reflux is seen during the course of this exam.  Thank you for the opportunity to contribute to the care of your patient.           Verified By: Dionne Ano WALL, M.D., MD   Assessment/Plan:  Assessment/Plan:   Assessment 1) n/v abdominal pain, diarrhea; symptoms improving gastric emptying study  markedly positive.  addition of reglan noted.  will need to advance to low residue diet.  would obtain dietary consult to help patient in this regard.  2) use of chronic pain meds for back problems-these will tend to worsen  gastroparesis,  recommend limiting narcotics to that currently used, discussed with patient    Plan as noted. continue reglan and ppi.   Electronic Signatures: Loistine Simas (MD)  (Signed 08-Feb-13 22:17)  Authored: Chief Complaint, VITAL SIGNS/ANCILLARY NOTES, Brief Assessment, Lab Results, Radiology Results, Assessment/Plan   Last Updated: 08-Feb-13 22:17 by Loistine Simas (MD)

## 2014-06-06 NOTE — Consult Note (Signed)
Chief Complaint:   Subjective/Chief Complaint Patietn seen and examined, please see full GI consult. Patietn admitted with intractible n/v, some abdominal pain.  She has a history of dx of Cyclical Vomiting Syndrome.  Currently feeling some better, tolerating clears.  In review with patietn she has not had a completed gastric empyting study.  Recommend using antiemetic on a scheduled doeing for 24-36 hours, obtain gastric empyting study when clinically feasible. Awaiting results of stool studies.  It may be necessary to do a EGD if symptoms do not resolve.  Continue ppi.  If GES positive for delay, reglan my also be of benefit with restriction of diet to low residue.  Following.   Electronic Signatures: Loistine Simas (MD)  (Signed 06-Feb-13 20:46)  Authored: Chief Complaint   Last Updated: 06-Feb-13 20:46 by Loistine Simas (MD)

## 2014-06-06 NOTE — Consult Note (Signed)
Brief Consult Note: Diagnosis: Intractable NVD.   Patient was seen by consultant.   Consult note dictated.   Discussed with Attending MD.   Comments: Appreciate consult for 49 y/o caucasian woman with history of chronic back pain, hypothyroidism, diverticulitis, DDD, depression, cholecystectomy, and hysterectomy for intractable NVD and lower abdominal pain. States that she started feeling very fatigued a week ago, with the onset of NVD 4-5d ago. Had been to ED a couple of times with same complaint, then admitted 2/4 for same, with mild dehydration and hypokalemia. States at some  point someone told her she might have cyclical vomiting syndrome, but this is not a confirmed diagnosis. Reports that both the vomit and stools are green- last episodes of both were yesterday, but the lower abdominal pain and nausea continue. States the smell of food nauseates her.  Defecating makes the lower cramps go away. Denies GERD symptoms,blood in stool, black tarry stools, problems swallowing, fevers, known sick contacts. No recent antibiotics or foreign travel. Does have several pets, including a turtle, parakeets, chickens, dogs, and cats. Uses well water, does not think it has been sanitized. Does eat at restaraunts regularly. Rare NSAID use. Does take oxycodone, neurontin, and xanaflex on a regular basis for chronic pain. Did have colonscopy 2008 with internal hemorrhoids only ,2 EGDs 2007 - the first with gastritis, the 2nd appeared normal. States she feels about the same as she did at the time of admission. Reports another episode like this about 22yr ago. Does not remember what made her better. Heme negative stool Impression: NVD, abdominal pain: differentials include viral v. bacterial; also may consider gastroparesis.  Agree with stool studies, PPI. Would schedule regular administration of antiemetic. Noted the patient has had 20mg  Dilaudid over the last couple of days. Will also plan for gastric emptying test to  assess gastroparesis in setting of chronic narcotic/gabapentin use, hypothyroidism, and intractable nausea  Electronic Signatures: Stephens November H (NP)  (Signed 06-Feb-13 17:39)  Authored: Brief Consult Note   Last Updated: 06-Feb-13 17:39 by Theodore Demark (NP)

## 2014-06-06 NOTE — Consult Note (Signed)
Chief Complaint:   Subjective/Chief Complaint Less nausea and abd pain. On clears. No more diarrhea.   VITAL SIGNS/ANCILLARY NOTES: **Vital Signs.:   21-Mar-13 11:47   Vital Signs Type Q 4hr   Temperature Temperature (F) 97.8   Celsius 36.5   Temperature Source oral   Pulse Pulse 84   Pulse source per Dinamap   Respirations Respirations 20   Systolic BP Systolic BP 606   Diastolic BP (mmHg) Diastolic BP (mmHg) 94   Mean BP 108   BP Source Dinamap   Pulse Ox % Pulse Ox % 97   Pulse Ox Activity Level  At rest   Oxygen Delivery Room Air/ 21 %   Brief Assessment:   Cardiac Regular    Respiratory clear BS    Gastrointestinal mild diffuse abd tenderness   Routine Hem:  20-Mar-13 00:53    WBC (CBC) 8.1   RBC (CBC) 4.58   Hemoglobin (CBC) 14.2   Hematocrit (CBC) 41.9   Platelet Count (CBC) 233   MCV 92   MCH 31.1   MCHC 34.0   RDW 14.1  Routine Chem:  20-Mar-13 00:53    Glucose, Serum 83   BUN 16   Creatinine (comp) 0.78   Sodium, Serum 142   Potassium, Serum 3.9   Chloride, Serum 106   CO2, Serum 18   Calcium (Total), Serum 8.7  Hepatic:  20-Mar-13 00:53    Bilirubin, Total 0.6   Alkaline Phosphatase 101   SGPT (ALT) 15   SGOT (AST) 27   Total Protein, Serum 8.2   Albumin, Serum 4.1  Routine Chem:  20-Mar-13 00:53    Osmolality (calc) 283   eGFR (African American) >60   eGFR (Non-African American) >60   Anion Gap 18   Lipase 43  Routine Hem:  21-Mar-13 04:31    WBC (CBC) 5.7   RBC (CBC) 4.48   Hemoglobin (CBC) 14.1   Hematocrit (CBC) 40.8   Platelet Count (CBC) 219   MCV 91   MCH 31.5   MCHC 34.6   RDW 14.1  Routine Chem:  21-Mar-13 04:31    Glucose, Serum 74   BUN 9   Creatinine (comp) 0.80   Sodium, Serum 141   Potassium, Serum 3.6   Chloride, Serum 104   CO2, Serum 21   Calcium (Total), Serum 8.9  Hepatic:  21-Mar-13 04:31    Bilirubin, Total 0.6   Alkaline Phosphatase 104   SGPT (ALT) 16   SGOT (AST) 27   Total Protein, Serum  8.1   Albumin, Serum 4.0  Routine Chem:  21-Mar-13 04:31    Osmolality (calc) 279   eGFR (African American) >60   eGFR (Non-African American) >60   Anion Gap 16  Routine Hem:  21-Mar-13 04:31    Neutrophil % 55.6   Lymphocyte % 34.8   Monocyte % 5.1   Eosinophil % 3.8   Basophil % 0.7   Neutrophil # 3.2   Lymphocyte # 2.0   Monocyte # 0.3   Eosinophil # 0.2   Basophil # 0.0   Assessment/Plan:  Assessment/Plan:   Assessment Nausea/vomiting. Improved.    Plan Try low residue diet. Continue antiemetics. If tolerates solids, ok for discharge. Thanks   Electronic Signatures: Verdie Shire (MD)  (Signed 21-Mar-13 13:05)  Authored: Chief Complaint, VITAL SIGNS/ANCILLARY NOTES, Brief Assessment, Lab Results, Assessment/Plan   Last Updated: 21-Mar-13 13:05 by Verdie Shire (MD)

## 2014-06-06 NOTE — Consult Note (Signed)
Chief Complaint:   Subjective/Chief Complaint main  complaint is hip pain, left more than right, history of chronic lower back pain.  less nausea, continues with llq pain   VITAL SIGNS/ANCILLARY NOTES: **Vital Signs.:   10-Feb-13 14:42   Vital Signs Type Routine   Temperature Temperature (F) 98.5   Celsius 36.9   Temperature Source oral   Pulse Pulse 79   Respirations Respirations 20   Systolic BP Systolic BP 423   Diastolic BP (mmHg) Diastolic BP (mmHg) 75   Mean BP 90   BP Source Dinamap   Pulse Ox % Pulse Ox % 96   Brief Assessment:   Cardiac Regular    Respiratory clear BS    Gastrointestinal details normal Soft  Nondistended  No masses palpable  Bowel sounds normal  mild llq discomfort to palpation, milder also in the luq.   Routine Chem:  09-Feb-13 06:48    Glucose, Serum 84   BUN 5   Creatinine (comp) 0.85   Sodium, Serum 140   Potassium, Serum 3.4   Chloride, Serum 103   CO2, Serum 26   Calcium (Total), Serum 8.8  Hepatic:  09-Feb-13 06:48    Bilirubin, Total 0.6   Alkaline Phosphatase 88   SGPT (ALT) 24   SGOT (AST) 28   Total Protein, Serum 7.2   Albumin, Serum 3.6  Routine Chem:  09-Feb-13 06:48    Osmolality (calc) 276   eGFR (African American) >60   eGFR (Non-African American) >60   Anion Gap 11  Thyroid:  09-Feb-13 06:48    Thyroid Stimulating Hormone 57.8   Thyroxine, Free 1.09  Routine Chem:  09-Feb-13 06:48    Magnesium, Serum 1.4   Assessment/Plan:  Assessment/Plan:   Assessment 1) nausea, vomiting, diarrheal illness-resolved except mild nausea.  continues with some left abdominal pain though improving. 2) bilater hip pain, much mor e singce hospitalization per patient.  history of chronic low back pain.  3) gastroparesis-on reglan, will restart low residue diet 4) hypothyroid-possibly contributory along with chronic pain meds to gastroparesis.    Plan 1) continue current.  if left abd pain continues, will do contrasted abd ct and  possible colonoscopy.   Electronic Signatures: Loistine Simas (MD)  (Signed 10-Feb-13 18:22)  Authored: Chief Complaint, VITAL SIGNS/ANCILLARY NOTES, Brief Assessment, Lab Results, Assessment/Plan   Last Updated: 10-Feb-13 18:22 by Loistine Simas (MD)

## 2014-06-06 NOTE — H&P (Signed)
PATIENT NAME:  Adrienne Flores, Adrienne Flores MR#:  [id number] DATE OF BIRTH:  [birth year]  DATE OF ADMISSION:  05/02/2011  PRIMARY CARE PHYSICIAN: [facility], [address], [place] GASTROENTEROLOGIST: Dr. Gustavo Lah   HOSPITALIST : Patient is a 49 year old Caucasian female with past medical history significant for history of rheumatoid arthritis as well as hypothyroidism, constipation as well as gastroparesis with most recent admission in February 2013, presented back to the hospital with complaints of nausea and vomiting as well as abdominal pain and inability to eat. According to patient, she was doing well up until approximately 3 or 4 days ago. She was discharged from the hospital on 03/26/2011. She did well up until a few days ago, 3 or 4 days ago, when she started having again nausea and vomiting. She is not able to eat or drink. She has been complaining of abdominal pain. Pain is usually in lower part of abdomen, however, sometimes shoots up to upper abdomen and intermittent and related to food intake. Approximately 20 minutes after she eats she would get nausea and vomiting. She got so dehydrated that she had very little urine output, in fact, yesterday she barely urinated and whenever she got urine sample her urine was dark color. She had large bowel movement on Sunday which is three days ago, which was a good bowel movement, however, since Sunday she has been having somewhat diarrheal stool. She denies any sick contacts or fevers and stated that diarrhea was somewhat new for her.   PAST MEDICAL HISTORY:  1. History of gastroparesis. 2. History of diverticulitis in the past. 3. Chronic back as well as neck pains. Patient does have occipital nerves blocks periodically performed by Dr. Primus Bravo.  4. History of scarlet fever. 5. Osteoporosis. 6. Hypothyroidism. 7. Degenerative disk disease. 8. Depressive disorder. 9. History of rheumatoid arthritis.   PAST SURGICAL HISTORY:   1. Hysterectomy. 2. Bilateral knee replacement. 3. Cholecystectomy   MEDICATIONS:  1. Reglan 10 mg 4 times daily. 2. Zofran as needed.  3. Synthroid 175 mcg p.o. daily.  4. Zanaflex 2 mg 1 to 2 tablets up to three times daily. 5. Oxycodone 5 mg up to three times daily. 6. Gabapentin 600 mg p.o. 3 times daily.  7. Multivitamins once daily.  8. Calcium with vitamin D once daily.   SOCIAL HISTORY: No smoking or alcohol abuse. Lives with her husband. She is a housewife.   FAMILY HISTORY: Patient's uncle had coronary artery disease. Patient's sister has coronary artery disease as well.   ALLERGIES: Patient admits allergy to methotrexate which gives her headache.   REVIEW OF SYSTEMS: CONSTITUTIONAL: Positive for weakness, pains in her abdomen, weight loss approximately 13 pounds since a week ago, some blurring of vision for which she uses reading glasses. Admits of some palpitations today as well as nausea and vomiting and diarrhea and abdominal pains. Decreased frequency of urination as well as dark urine and very dry skin. Otherwise, denies any fevers, chills, fatigue or weight gain. EYES: In regards to eyes, denies any double vision, glaucoma, cataracts. ENT: Denies any tinnitus, allergies, epistaxis, sinus pain, dentures, difficulty swallowing. RESPIRATORY: Denies any cough, wheezes, asthma, chronic obstructive pulmonary disease. CARDIOVASCULAR: Denies orthopnea, chest pains, arrhythmias, palpitations, or syncope. GASTROINTESTINAL: Denies any hematemesis, rectal bleeding, change in bowel habits. Admits of having constipation in the past, however, had a good bowel movement just a few days ago and since a few days ago she has been having diarrhea. GENITOURINARY: Denies dysuria, hematuria, or incontinence. ENDOCRINOLOGY: Denies any  polydipsia, nocturia, thyroid problems, heat or cold intolerance, or thirst. HEMATOLOGIC: Denies anemia, easy bruising, bleeding, swollen glands. SKIN: Denies any acne,  rash, change in moles. MUSCULOSKELETAL: Denies arthritis, cramps, swelling, gout. NEUROLOGIC: No numbness, epilepsy, tremor. PSYCHIATRIC: Denies anxiety, insomnia, or depression.     PHYSICAL EXAMINATION:  VITAL SIGNS: On arrival to the hospital patient's vitals: Temperature 97.2, pulse 100, respiration rate 18, blood pressure 156/112, saturation 100% on room air.   GENERAL: This is well nourished Caucasian female in no significant distress sitting on the stretcher.   HEENT: Her pupils are equal, reactive to light. Extraocular movements intact. No icterus or conjunctivitis. Has normal hearing. No pharyngeal erythema. Mucosa is very dry.  NECK: Neck did not reveal any masses, supple, nontender. Thyroid not enlarged. No adenopathy. No JVD or carotid bruits bilaterally. Full range of motion.   LUNGS: Clear to auscultation in all fields. No rales, rhonchi, diminished breath sounds or wheezing. No labored inspirations, increased effort, dullness to percussion, not in overt respiratory distress.   CARDIOVASCULAR: S1, S2 appreciated. No murmurs, gallops, rubs noted. Rhythm is regular. PMI not lateralized. Chest is nontender to palpation.   EXTREMITIES: 1+ pedal pulses. No lower extremity edema, calf tenderness, or cyanosis was noted.   ABDOMEN: Soft, nontender. Bowel sounds are present. No hepatomegaly or masses were noted. Mild discomfort all over her abdomen however, no tender points were noted.   RECTAL: Deferred.   MUSCULOSKELETAL: 5/5 in all extremities. No cyanosis, degenerative joint disease, or kyphosis. Gait is not tested.   SKIN: Skin did not reveal any rashes, lesions, erythema, nodularity, induration. It was warm and very dry to palpation, even scaling.   LYMPH: No adenopathy in cervical region.   NEUROLOGICAL: Cranial nerves grossly intact. Sensory is intact. No dysarthria, aphasia.   PSYCH: Patient is alert, oriented to time, person, place, cooperative. Memory is good. No  significant confusion, agitation, or depression noted.   LABORATORY, DIAGNOSTIC, AND RADIOLOGICAL DATA: BMP showed within normal limits except for bicarbonate level was low at 18, anion gap was also elevated to 18. Patient's liver enzymes were normal. CBC within normal limits. Urinalysis amber cloudy urine, negative for glucose or bilirubin, 1+ ketones, specific gravity 1.029, pH  5.0, negative for blood, 30 mg/dL protein, negative for nitrites, 1+ leukocyte esterase, 3 red blood cells, 5 white blood cells, trace bacteria, 26 epithelial cells as well as mucus is present. EKG is not done.   ASSESSMENT AND PLAN:  1. Gastroparesis exacerbation/flare. Admit patient to medical floor. Continue her on IV fluids as well as symptomatic therapy with Zofran, Reglan as well as oxycodone and possibly even Dilaudid. Patient received few Dilaudid doses while in the Emergency Room. We need to rule out other abdominal pathology. Will get also lipase level and will ask gastroenterologist to come by.   2. Dehydration. As above will continue IV fluids.  3. Acidosis, metabolic with anion gap possibly related to her diarrhea as well as poor p.o. intake. Will follow with IV fluid administration.  4. History of constipation. Will hold off any medications as patient describes having diarrhea. Will get stool cultures for diarrhea.  5. Hypothyroidism. Continue Synthroid. 6. History of neuropathy. Continue gabapentin.  TIME SPENT: One hour.    ____________________________ Theodoro Grist, MD rv:cms D: 05/02/2011 05:57:22 ET T: 05/02/2011 09:05:35 ET JOB#: [id number]  cc: Theodoro Grist, MD, <Dictator> Dani Gobble. Dema Severin, NP Rayden Dock MD ELECTRONICALLY SIGNED 05/05/2011 10:48

## 2014-06-06 NOTE — Consult Note (Signed)
PATIENT NAME:  Adrienne Flores, Adrienne Flores MR#:  [id number] DATE OF BIRTH:  Jun 13, 1965  DATE OF CONSULTATION:  05/02/2011  REFERRING PHYSICIAN:  Dr. Ether Griffins  CONSULTING PHYSICIAN:  Dr. Eddie Dibbles Oh/ Payton Emerald, NP  PRIMARY CARE PHYSICIAN:  Eulogio Bear, PA at [facility] GASTROENTEROLOGIST: Dr. Loistine Simas     REASON FOR CONSULTATION: Gastroparesis flare.  HISTORY OF PRESENT ILLNESS: Adrienne Flores is a 49 year old Caucasian female with a past medical history of gastroparesis recently diagnosed during her hospitalization in February 2012 and diverticulitis, chronic back pain and neck pain followed by Dr. Primus Bravo, scarlet fever, osteoporosis, hypothyroidism, degenerative disk disease, depressive disorder, and rheumatoid arthritis.   She was hospitalized in February 2013 for seven days in length because of intractable nausea and vomiting with associated abdominal pain. During that time she had a gastric emptying study done which revealed abnormal gastric emptying at 9%. At that time she was placed on Reglan 10 mg, one tablet before meals and one at bedtime. She was doing well up until this past Saturday, started to feel discomfort across her upper abdomen, then started with nausea and vomiting. She has remained on low residue diet at home. She had a 3 to 4-day loss of appetite prior to onset of the symptoms being more severe. She does not have any difficulty with liquids; when she eats solid foods is when she experiences early satiety. She was given apple juice and chicken broth in the Emergency Room and was only able to tolerate a small amount and she became more nauseated. One episode of diarrhea this morning. Prior to that, last bowel movement was on Saturday. She has known history of constipation where her bowel pattern habit is normally once every three days. No rectal bleeding. No melena.   PAST MEDICAL HISTORY:  1. Gastroparesis.  2. Diverticulitis.  3. Chronic back pain and neck  pain.  4. Scarlet fever.  5. Osteoporosis.  6. Hypothyroidism.  7. Degenerative disk disease.  8. Depressive disorder. 9. History of rheumatoid arthritis.   PAST SURGICAL HISTORY:  1. Hysterectomy with bilateral salpingo-oophorectomy. 2. Bilateral knee replacements five years and approximately four years ago performed by Dr. Marry Guan. 3. Cholecystectomy performed by Dr. Sharlet Salina due to biliary dyskinesia.  4. Fractured thumb currently undergoing orthopedic evaluation and treatment.   CURRENT HOME MEDICATIONS: 1. Reglan 10 mg 1 tablet 4 times a day.  2. Zofran 8 mg as directed as needed.  3. Synthroid 175 mcg a day.  4. Zanaflex 2 mg 1 to 2 tablets up to 3 times a day.  5. Oxycodone 5 mg 1 tablet 3 times a day.  6. Gabapentin 600 mg 3 times daily.  7. Multivitamin. 8. Calcium with vitamin D.   SOCIAL HISTORY:  No tobacco use. No alcohol. Lives with her husband. Housewife.   FAMILY HISTORY: Uncle with history of coronary artery disease. Sister with history of coronary artery disease. Mother with history of leukemia.   ALLERGIES: Allergic to methotrexate, gives her headaches.   REVIEW OF SYSTEMS:  CONSTITUTIONAL: Significant for fever, significant for weight loss since February of approximately 13 pounds. Significant for fatigue. HEENT: Significant for blurred vision and uses reading glasses. Denies any tinnitus, allergies, epistaxis, sinus pain, or difficulty swallowing. RESPIRATORY: No coughing, no wheezing, no asthma, no chronic obstructive pulmonary disease. CARDIOVASCULAR: No chest pain. No arrhythmias. No palpitations or syncope. GI: See history of present illness. GU: Denies any dysuria, hematuria, or incontinence. ENDOCRINE: No polydipsia, nocturia, thyroid problems, heat or cold  intolerance, or thirst. HEMATOLOGIC: Denies anemia. Denies being significant for easy bruising or bleeding. SKIN: No rashes. No lesions though noted infiltration of IV during interviewing process.  MUSCULOSKELETAL: Denies any neuralgias. Significant for chronic pain to back.  NEUROLOGICAL: Known history of depression.     PHYSICAL EXAMINATION:  VITAL SIGNS:  Temperature 97.6, pulse 80, respirations 20, blood pressure 136/86, pulse oximetry 100% on room air.   GENERAL: Well developed, overweight 50 year old Caucasian female, no acute distress noted. Pleasant. Resting comfortably in bed.   HEENT: Normocephalic, atraumatic. Pupils equal, reactive to light. Conjunctivae clear. Sclerae anicteric.   NECK: Supple. Trachea midline. No lymphadenopathy or thyromegaly. Upper right chest clavicular area noted large infiltration in association with her IV. IV was stopped and the patient's nurse notified.   PULMONARY: Symmetric rise and fall of chest. Clear to auscultation throughout. No adventitious sounds.   CARDIOVASCULAR: Regular rhythm, S1, S2. No murmurs, no gallops.   ABDOMEN: Large, soft, nondistended. Hypoactive bowel sounds. No evidence of hepatosplenomegaly. Tenderness noted to upper abdomen. No rebound tenderness.   RECTAL: Deferred.   MUSCULOSKELETAL: Moving all four extremities. No contractures. No clubbing.   SKIN: Warm, dry. Color slightly pale. No lesions. No rashes.   EXTREMITIES: No edema.   PSYCH: Alert and oriented times four. Appropriate affect and mood.    LABORATORY, DIAGNOSTIC, AND RADIOLOGICAL DATA: Chemistry panel within normal limits except serum CO2 low at 18, anion gap elevated at 18, and lipase 43. CBC within normal limits. Hepatic panel within normal limits as well. Urinalysis on the nineteenth revealed +1 ketones, +1 leukocytes, RBCs were 3 per high-power field with WBC 5 per high-power field, epithelial cells 26 per high-power field, mucous present. Three-way of the abdomen inclusive of PA of chest unremarkable.   IMPRESSION:  1. Recently diagnosed with gastroparesis. Concern with accuracy of this diagnosis as narcotic therapy was not held when tests were  being done. Additionally, gastric emptying study was performed for only 90 minutes versus recommended guidelines of 4 hours.  2. Chronic back pain as well as neck pain requiring chronic narcotic management.  3. Known history of hypothyroidism.  4. Known history of depression.   PLAN:  1. The patient's presentation was discussed with Dr. Verdie Shire. Recommendation is for gastric emptying study to be considered to be repeated where it is able to be done for four hours and where the patient has been off narcotic therapy for at least 48 hours prior to the test being done.  2. In agreement with the continuation of Reglan empirically at this time to help with the nausea and vomiting. Also other antiemetic therapy to be continued during her hospitalization as well as Zofran on an outpatient basis.  3. Do recommend small frequent meals with more of a low-residue diet. 4. We will continue to monitor the patient during her hospitalization. Otherwise, no further GI  recommendations at this time.    These services provided by Ebony Cargo, NP in collaborative agreement with Dr. Verdie Shire. Thank you for allowing [place] to participate in the care of Adrienne Flores  during her hospitalization.   ____________________________ Payton Emerald, NP dsh:bjt D: 05/02/2011 16:02:00 ET T: 05/02/2011 16:53:07 ET JOB#: [id number]  cc: Payton Emerald, NP, <Dictator> Payton Emerald MD ELECTRONICALLY SIGNED 05/02/2011 18:10

## 2014-06-06 NOTE — Discharge Summary (Signed)
PATIENT NAME:  Adrienne Flores, Adrienne Flores MR#:  [id number] DATE OF BIRTH:  1965/06/30  DATE OF ADMISSION:  03/19/2011 DATE OF DISCHARGE:  03/26/2011  ADMITTING DIAGNOSIS: Intractable nausea and vomiting.  DISCHARGE DIAGNOSES:  1. Intractable nausea and vomiting, resolved, likely gastroparesis related, per nuclear test.  2. Constipation, exacerbated by opiates as well as hypothyroidism, resolved with stool stimulants as well as softeners and enema. 3. Chronic pain syndrome.  4. Hypokalemia. 5. Hypomagnesemia. 6. Bilateral hip pain of unclear etiology. 7. History of rheumatoid arthritis.  8. Obesity.   DISCHARGE CONDITION: Stable.   DISCHARGE MEDICATIONS: The patient is to resume her outpatient medications which include the following. 1. Zanaflex 2 mg one to two 2 tablets daily or twice daily as needed.  2. Oxycodone 5 mg two to three tablets daily as needed. 3. Levothroid 175 mcg p.o. daily. 4. Gabapentin 600 mg p.o. three times daily.  ADDITIONAL MEDICATIONS:  1. Roxicodone 5 mg p.o. every six hours as needed.  2. Senokot 1 tablet twice daily as needed. The patient was advised to have at least one soft bowel movement daily.  3. MiraLax 17 grams p.o. daily.  4. Omeprazole 40 mg p.o. twice daily.  5. Colace 100 mg p.o. twice daily.  6. Reglan 10 mg p.o. four times daily before meals.  7. Zofran 4 mg every four hours as needed.   HOME OXYGEN: None.   DIET: Soft, low residue. The patient was advised to drink Ensure if she does not eat a lot as she refused to drink fluids and that was stressed to her upon discharge.   DISCHARGE FOLLOWUP: She was advised to follow up with her primary care physician, Eulogio Bear, PA in two to three days after discharge. She is to follow-up with Dr. Gustavo Lah in one week after discharge.   CONSULTANTS:  1. Loistine Simas, MD. 2. Care Management.   RADIOLOGIC STUDIES: Gastric emptying study, Apr 11, 2011, showed abnormal gastric emptying study, gastric  emptying measures 9% which is well below the normal range. No gastroesophageal reflux is seen during the course of this examination, according to the radiologist.   CT of abdomen and pelvis with contrast on 03/26/2011 showed no evidence of diverticulitis or other form of colitis. No evidence of bowel obstruction or ileus. There are fatty infiltrative changes of the liver. There is borderline splenomegaly. Gallbladder is surgically absent. The pancreas exhibits no acute abnormality. No intraabdominal or pelvic lymphadenopathy. There is no evidence of ascites.   Left and right hip x-rays, on 03/26/2011: No evidence of acute abnormalities bilaterally, according to the radiologist.   HISTORY OF PRESENT ILLNESS: The patient is a 49 year old Caucasian female with past medical history significant for history of chronic pain syndrome who presented to the hospital with complaints of intractable nausea and vomiting. Please refer to Dr. Serita Grit admission note on 03/19/2011. Apparently the patient had problems with cyclical vomiting syndrome. She was seen one day before admission to the hospital, in the Emergency Room, and she was given some antiemetics and pain medications, as well as IV fluids, and was released home. However, she came back with intractable nausea and vomiting, as well as episodes of diarrhea.   On arrival to the Emergency Room, she was afebrile. Her pulse was 84, respiration rate was normal, blood pressure was 144/99, and saturation was 98% on room air. Physical examination was remarkable for mild generalized tenderness, but otherwise no organomegaly or other significant changes. The patient was admitted to the hospital.  LABS/STUDIES: On arrival to  the emergency room, her labs showed an elevation of creatinine of 1.32, on 03/19/2011. However, the patient's BUN was normal at 6, otherwise BMP was unremarkable. The patient's liver enzymes were remarkable for total protein elevated to 8.6, otherwise  unremarkable. Troponin level was less than 0.02. CBC was within normal limits.   Urinalysis was remarkable for 2+ leukocyte esterase and 3 red blood cells, as well as 3 white cells. However, urine cultures grew bacterial organisms, results suggestive of contamination.   HOSPITAL COURSE: The patient was admitted to the hospital. Consultation with a  gastroenterologist was obtained. The gastroenterologist recommended to get gastric emptying study, which was significantly abnormal. Multiple days passed with conservative management; however, minimal improvement was made with this patient. She required a lot of pain medications as well as medications to help her with constipation, which was relieved with stool softeners as well as enema. She also was complaining of some hip pain, as well as generalized abdominal pain. For this reason, she underwent CT scan of her abdomen as well as x-rays of her hips. However, no pathology was found. It was felt that the patient's chronic pain could have been related to her symptoms as well as constipation, probably giving her significant discomfort in her abdomen. However, as the patient's constipation was relieved, the patient was advised to continue medications to help prevent constipation and continue Reglan for her gastroparesis. She is to follow-up with her primary care physician for further recommendations and she is to follow-up with Dr. Gustavo Lah for further recommendations as well.  The patient was noted to have hypokalemia as well as hypomagnesemia. Both of those elements were supplemented IV as well as orally and abnormalities resolved. It is recommended to follow the patient's magnesium as well as potassium levels as an outpatient to ensure normal levels.   In regards to hypothyroidism, the patient is to continue her outpatient medications, her usual dose of Synthroid. TSH was mildly elevated, however, it was much less than it was in the past. TSH was done on  03/24/2011 and was 57.8, however, the patient's free T4 as well as free T3 were within normal limits. It was felt that the patient's hypothyroidism, still poorly controlled, could have contributed to her gastroparesis as well as constipation.   In regards to bilateral hip pain, it was unclear why she had significant pain, especially when she strains. Initially it was felt that could it be due to constipation, however, as the patient's constipation was relieved, she was still having some hip pain. X-rays were performed and x-rays were unremarkable though. It is recommended to reassess the patient's pain as an outpatient and make decisions about reevaluation of her bilateral hip pain if needed. The patient was able to ambulate however with no significant distress or hip pain, in the hallway, prior to leaving the hospital.  The patient was advised to lose weight and manage her obesity, which is contributing to her hip problems. The patient is being discharged in stable condition with the above-mentioned medications and follow-up.   On the day of discharge her temperature was 98.2, pulse 76, respiratory rate 20, blood pressure 131/81, and saturation 100% on room air at rest.   TIME SPENT: 40 minutes. ____________________________ Theodoro Grist, MD rv:slb D: 03/26/2011 20:36:31 ET T: 03/27/2011 11:18:37 ET JOB#: [id number]  cc: Theodoro Grist, MD, <Dictator> Eulogio Bear, [place]  Precilla Purnell Ether Griffins MD ELECTRONICALLY SIGNED 04/02/2011 11:59

## 2014-06-06 NOTE — Discharge Summary (Signed)
PATIENT NAME:  Adrienne Flores, Adrienne Flores MR#:  [id number] DATE OF BIRTH:  1965/03/09  DATE OF ADMISSION:  05/02/2011 DATE OF DISCHARGE:  05/03/2011  DISCHARGE DIAGNOSES:  1. Gastroparesis with nausea and vomiting. Nausea and vomiting resolved.  2. History of gastroparesis before.  3. History of chronic pain in the back and neck. History of nerve blocks by Dr. Primus Bravo. 4. History of osteoporosis.  5. Hypothyroidism.  6. Degenerative disk. 7. Rheumatoid arthritis. 8. Diverticulitis.   DISCHARGE MEDICATIONS:  1. Reglan 10 mg 4 times daily.  2. Zofran as needed.  3. 4. Zanaflex 2 mg, 2 tablets up to 3 times daily.  5. Oxycodone 5 mg 3 times daily. 6. Gabapentin 600 mg  p.o. t.i.d.  7. Calcium with vitamin D.   CONSULTATIONS:  GI consult with Dr. Candace Cruise.   HOSPITAL COURSE: This is a 49 year old female patient with past medical history of gastroparesis diagnosed recently in February and history of chronic back pain, neck pain, osteoporosis, and hypothyroidism who came in because of abdominal pain with nausea and vomiting. The patient had a gastric emptying done in February.  At that time it was only 9%. The patient was doing better after discharge in February, started on Reglan, but came in because of nausea and vomiting. See the History and Physical for full details.  She also had an episode of diarrhea. The patient was started on IV fluids along with IV Reglan and was seen by the gastroenterologist. The patient because she was on narcotics gastric emptying study in [place]. He was the question. Because she was on narcotics, the gastric emptying study reliability was questioned.  The patient should have a gastric emptying study after she has been off the narcotics and should be done after four hours.  The gastric emptying study was done only after 90 minutes so the study accuracy was questioned by the gastroenterologist. However the patient was seen by Dr. Candace Cruise and did not recommend repeating studies because the  patient should be off the narcotics at least 48 hours before testing is done. The patient anyway is doing better with the Reglan and tolerated a liquid diet. Dr. Candace Cruise recommended frequent meals . The patient was seen by him today. The patient did not have any more nausea, vomiting, or abdominal pain. She was tolerating the diet and eager to go home. The patient was cleared by gastroenterology to be discharged. The rest of the labs, white count and electrolytes, are stable. The abdominal x-ray showed unremarkable abdominal series on admission. The patient is on Reglan 10 mg t.i.d. that can be continued. Follow up with GI.  TIME SPENT ON DISCHARGE PREPARATION: More than 30 minutes.   ____________________________ Epifanio Lesches, MD sk:bjt D: 05/03/2011 15:08:33 ET T: 05/04/2011 09:29:46 ET JOB#: [id number]  cc: Epifanio Lesches, MD, <Dictator> Epifanio Lesches MD ELECTRONICALLY SIGNED 05/07/2011 20:06

## 2014-06-06 NOTE — Consult Note (Signed)
PATIENT NAME:  Adrienne Flores, Adrienne Flores MR#:  [id number] DATE OF BIRTH:  [birth year]  DATE OF CONSULTATION:  03/21/2011  REFERRING PHYSICIAN:   CONSULTING PHYSICIAN:  Theodore Demark, MSN, NPC/Martin Kassie Mends, MD   PRIMARY CARE PHYSICIAN: Eulogio Bear, NP at [facility].   HISTORY OF PRESENT ILLNESS: Adrienne Flores was admitted two days ago with intractable nausea and vomiting for three days. Please see the admission history and physical for full details. She is a 49 year old Caucasian woman referred by Dr Leslye Peer, with a history of rheumatoid arthritis, hypothyroidism, chronic back and neck pain for which she is followed in the pain clinic, cholecystectomy. She tells me today that she was feeling fatigued for the last week with the onset of nausea, vomiting and diarrhea 4 to 5 days ago. She has been to the ED a couple of times with the same complaint and then was admitted on 02/04 for the same along with some dehydration and hypokalemia. She states at one point someone told her she might have cyclical vomiting syndrome, but this was not a confirmed diagnosis. Reports that both her emesis and stools are green. The last episodes of both were yesterday, but the lower abdominal pain and the nausea continue. States the smell of food nauseates her. Defecating makes the lower back pain and cramps go away. She denies acid reflux and other gastroesophageal reflux disease    symptoms, blood in the stool, black tarry stools, problems swallowing, fevers, known sick contacts. Denies recent antibiotics or foreign travel. Does have several pets including a turtle, parakeets, chickens, dogs and cats. Uses well water. Does not think it has been sanitized. Does eat at restaurants regularly. Rare NSAID use. Does take oxycodone, Neurontin and Zanaflex on a regular basis for chronic pain. Did have colonoscopy in 2008 with internal hemorrhoids only. Two EGDs in 2007, the first with gastritis. The second appeared normal.  Today she states that she feels about the same as she did at the time of admission. Reports a similar episode like this about five years ago, does not remember what made her better. Noted that she has received several doses of Dilaudid since arrival and stools have been ordered for C. difficile, white blood cell, occult blood, Giardia, ova parasite and culture and she is on pantoprazole IV b.i.d.   ALLERGIES: Methotrexate, Relafen.   PAST MEDICAL HISTORY:  1. Diverticulitis.  2. Chronic back and neck pain. She is seen by Dr. Primus Bravo and has had occipital nerve blocks in the past. 3. Scarlet fever. 4. Osteoporosis. 5. Hypothyroidism.  6. Degenerative disk disease. 7. Depressive disorder. 8. Rheumatoid arthritis. 9. Hysterectomy. 10. Bilateral knee replacement.  11. Cholecystectomy.   HOME MEDICATIONS: 1. Gabapentin 600 t.i.d.  2. Levothroid 175 mcg p.o. daily.  3. Oxycodone 5 mg 2 to 3 p.o. daily.  4. Zanaflex 2 mg 1 to 2 b.i.d. as tolerated.   SOCIAL HISTORY: Lives with husband. No tobacco or alcohol. No illicits.   FAMILY HISTORY: No history of liver disease, peptic ulcer disease. Reports family history of leukemia and questionable other types of cancer. Also, pertinent for coronary artery disease.   REVIEW OF SYSTEMS: Positive for fatigue. No fevers, weight changes. HEENT: No headaches at present, changes to vision, says she does have some ear ringing from time to time. No hearing loss or pain. RESPIRATORY: No cough, wheeze, shortness of breath, hemoptysis. CARDIOVASCULAR: No chest pain, edema, orthopnea, syncope. GASTROINTESTINAL: As noted. GU: No dysuria or hematuria. ENDOCRINE: Does have a history  of hypothyroidism. No history of diabetes, polyuria, polyphagia, polydipsia. HEMATOLOGY: No history of anemia. SKIN: No erythema, lesion or rash. MUSCULOSKELETAL: Does have history of rheumatoid arthritis, chronic back and neck pain. No unusual joint pain. NEUROLOGIC: No history of  cerebrovascular accident or transient ischemic attack, fainting, dizziness or seizures. PSYCH: Does have history of depression which is not an issue at this time. No anxiety.   LABORATORY, RADIOLOGICAL AND DIAGNOSTIC DATA: Most recent lab work: Serum glucose 81, BUN 5, creatinine 1.01, serum sodium 145, potassium 3.2, chloride 108, GFR greater than 60, calcium 8.4. Liver panel on 02/04: total serum protein 8.6, albumin 4.3, total bilirubin 0.9, ALP 113, AST 29, ALT 28. Troponin was 0.02. CBC from 02/06: WBC 4.1, hemoglobin 11.7, hematocrit 33, platelet count 163, MCV 93, MCH 32.8, RDW 17, normal distribution noted on the differential. Urine test suggest a possible urinary tract infection. However, culture came back with mixed results suggesting contamination. Noncontrasted CT on 02/04 done for left lower quadrant pain was negative for acute changes and there were no kidney stones or other uropathy noted as well.   PHYSICAL EXAMINATION:  MOST RECENT VITAL SIGNS: Temperature 96.7, pulse 74, respiratory rate 18, blood pressure 127/72, oxygen saturation 93% on room air.   GENERAL: Obese Caucasian woman lying in bed in no apparent distress, appears comfortable at present.   PSYCH: Mood stable, logical thought, pleasant, cooperative.   HEENT: Normocephalic, atraumatic. No redness or drainage or inflammation to eyes or nares. Oral mucous membranes are pink and moist. No icterus to sclerae.   NECK: Supple. No JVD, thyromegaly, lymphadenopathy.   RESPIRATORY: Respirations eupneic. Lungs CTAB.   CARDIOVASCULAR: S1, S2. Regular rate and rhythm. No murmurs, rubs, or gallops. Peripheral pulses palpable 2+. No edema.   ABDOMEN: Protuberant abdomen. Active bowel sounds x4. Soft, nondistended. Tenderness to both lower quadrants, primarily the left lower quadrant. No rebound, tenderness, peritoneal signs, hepatosplenomegaly, hernias, masses or guarding.   RECTAL: No apparent abnormalities. Stool brownish, heme  negative.   EXTREMITIES: Strength five out of five. Sensation intact. No clubbing, cyanosis, or edema.   GU: Normal female genitalia.   NEUROLOGICAL: Cranial nerves II through XII grossly intact. Alert and oriented x3. Speech clear. No facial droop.   IMPRESSION/RECOMMENDATIONS: Nausea, vomiting, diarrhea with abdominal pain. Differentials include viral versus bacterial gastroenteritis. Also should consider some possible gastroparesis as the patient has been on chronic narcotics, gabapentin and has hypothyroidism and has never had a gastric emptying study and has episode similar like this in the past. Agree with stool studies and PPI. Would schedule regular administration of antiemetic. Gastroenterology will follow.   Thank you for this consult.   These services were provided by Theodore Demark, MSN, NPC in collaboration with Lollie Sails, MD.    ____________________________ Theodore Demark, NP chl:ap D: 03/22/2011 08:50:40 ET T: 03/22/2011 09:08:18 ET JOB#: [id number]  cc: Theodore Demark, NP, <Dictator> [place] SIGNED 03/22/2011 13:47

## 2014-06-06 NOTE — Consult Note (Signed)
Chief Complaint:   Subjective/Chief Complaint continued nausea, less abdominal pain. no diarrhea   VITAL SIGNS/ANCILLARY NOTES: **Vital Signs.:   09-Feb-13 14:15   Vital Signs Type Routine   Temperature Temperature (F) 98.6   Celsius 37   Temperature Source oral   Pulse Pulse 74   Respirations Respirations 18   Systolic BP Systolic BP 149   Diastolic BP (mmHg) Diastolic BP (mmHg) 87   Mean BP 101   BP Source Dinamap   Pulse Ox % Pulse Ox % 98   Pulse Ox Activity Level  At rest   Brief Assessment:   Cardiac Regular    Respiratory clear BS    Gastrointestinal details normal Soft  Nondistended  No masses palpable  Bowel sounds normal  No rebound tenderness  mild tenderness in the left abdomen   Routine Chem:  09-Feb-13 06:48    Glucose, Serum 84   BUN 5   Creatinine (comp) 0.85   Sodium, Serum 140   Potassium, Serum 3.4   Chloride, Serum 103   CO2, Serum 26   Calcium (Total), Serum 8.8  Hepatic:  09-Feb-13 06:48    Bilirubin, Total 0.6   Alkaline Phosphatase 88   SGPT (ALT) 24   SGOT (AST) 28   Total Protein, Serum 7.2   Albumin, Serum 3.6  Routine Chem:  09-Feb-13 06:48    Osmolality (calc) 276   eGFR (African American) >60   eGFR (Non-African American) >60   Anion Gap 11  Thyroid:  09-Feb-13 06:48    Thyroid Stimulating Hormone 57.8   Thyroxine, Free 1.09  Routine Chem:  09-Feb-13 06:48    Magnesium, Serum 1.4   Radiology Results: Nuclear Med:    08-Feb-13 12:40, Gastric Emptying Study - Nuc Med   Gastric Emptying Study - Nuc Med    REASON FOR EXAM:    Nausea, chronic gabapentin use, hypothyroidism,   possible gastroparesis  COMMENTS:       PROCEDURE: NM  - NM GASTRIC EMPTYING STUDY  - Mar 23 2011 12:40PM     RESULT: Following oral administration of 2.17 mCi technetium 62msulfur   colloid administered in an egg sandwich with 8 ounces of water there is   observed 9% gastric emptying at 95 minutes. This value is in the markedly   hypokinetic  range.    IMPRESSION:   1. Abnormal gastric emptying study. Gastric emptying measures9%, which   is well below the normal range.  2. No gastroesophageal reflux is seen during the course of this exam.  Thank you for the opportunity to contribute to the care of your patient.           Verified By: JDionne AnoWALL, M.D., MD   Assessment/Plan:  Assessment/Plan:   Assessment 1) acute gi illness with n/v diarrhea/ abdominal pain..Marland Kitchenall sy improved, no diarrhea, no bm.  gastric emptying styudy c/w gastroparesis, etiology uncertain, likely exacerbated by narcotic use for chronic back pain.    Plan 1) continue ppi and reglan.  low residue diet.  pe showing less llq pain than yesterday.  no new GI recs, but would try to wean dilaudid re gastroparesis.   Electronic Signatures: SLoistine Simas(MD)  (Signed 09-Feb-13 18:13)  Authored: Chief Complaint, VITAL SIGNS/ANCILLARY NOTES, Brief Assessment, Lab Results, Radiology Results, Assessment/Plan   Last Updated: 09-Feb-13 18:13 by SLoistine Simas(MD)

## 2014-06-06 NOTE — H&P (Signed)
PATIENT NAME:  Adrienne Flores, Adrienne Flores MR#:  [id number] DATE OF BIRTH:  December 11, 1965  DATE OF ADMISSION:  03/19/2011  PRIMARY CARE PHYSICIAN:  [facility]- Eulogio Bear, NP  CHIEF COMPLAINT: Intractable nausea and vomiting for three days.  HISTORY OF PRESENT ILLNESS: Adrienne Flores is a morbidly obese 49 year old Caucasian female with history of rheumatoid arthritis and hypothyroidism who comes to the Emergency Room accompanied by her husband with complaints of nausea and vomiting for three days associated with some diarrhea. The patient states she has had these kinds of symptoms before, however it was a long time ago. She gets it periodically and she was told code it might be "cyclical vomiting syndrome". She was seen in the Emergency Room yesterday, given antiemetics, pain medications, and IV fluids. She was released to go home and came back with intractable vomiting and diarrhea.  She had vomited twice today and had diarrhea a few minutes ago in the Emergency Room. She appears mildly clinically dehydrated. She is being admitted for further evaluation and management.   ALLERGIES: Methotrexate and Relafen.   PAST MEDICAL HISTORY:  1. History of diverticulitis.  2. Chronic back pain and neck pain. She takes occipital nerve blocks periodically from Dr. Primus Bravo.  3. History of scarlet fever.  4. Osteoporosis.  5. Hypothyroidism.  6. Degenerative disk disease.  7. Depressive disorder.  8. History of rheumatoid arthritis.   PAST SURGICAL HISTORY:  1. Hysterectomy.  2. Bilateral knee replacement. 3. Cholecystectomy.   MEDICATIONS:  1. Gabapentin 600 mg three times a day.  2. Levothroid 175 mcg p.o. daily.  3. Oxycodone 5 mg, limit  2-3 tablets p.o. daily.  4. Zanaflex 2 mg, 1 to 2 tablets b.i.d. as tolerated.   SOCIAL HISTORY: Nonsmoker, nonalcoholic. Lives with her husband. She is a housewife.   FAMILY HISTORY: Uncle with coronary artery disease. The patient's sister has coronary artery  disease as well.   REVIEW OF SYSTEMS: CONSTITUTIONAL: No fever. Positive for fatigue and weakness. EYES: No blurred or double vision. ENT: No tinnitus, ear pain, or hearing loss. RESPIRATORY: No cough, wheeze, or hemoptysis. CARDIOVASCULAR: No chest pain, orthopnea, or edema. GASTROINTESTINAL: Positive for nausea, vomiting, diarrhea, and abdominal pain. No gastroesophageal reflux disease. GENITOURINARY: No dysuria or hematuria. ENDOCRINE: No polyuria or nocturia. HEMATOLOGY: No anemia or easy bruising. SKIN: No acne or rash. MUSCULOSKELETAL: Positive for arthritis. NEUROLOGIC: No cerebrovascular accident or transient ischemic attack. PSYCH: No anxiety. Depression. All other systems reviewed and negative.   PHYSICAL EXAMINATION:  GENERAL: The patient is awake, alert, and oriented times three, not in acute distress.   VITAL SIGNS: She is afebrile, pulse 84, blood pressure is 144/99. Sats 98% on room air.   HEENT: Atraumatic, normocephalic. Pupils are equal, round, and reactive to light and accommodation. Extraocular movements intact. Oral mucosa is mildly dry.   NECK: Supple. No JVD. No carotid bruit.   LUNGS: Clear to auscultation bilaterally. No rales, rhonchi, respiratory distress, or labored breathing.   CARDIOVASCULAR: Both the heart sounds are normal. Rate and rhythm are regular. PMI is not lateralized. Chest nontender.   EXTREMITIES: Good pedal pulses, good femoral pulses. No lower extremity edema.   ABDOMEN: Soft, morbidly obese. Mild generalized tenderness. No organomegaly.    EXTREMITIES: Good pedal pulses, good femoral pulses. No lower extremity edema.   NEURO: Grossly intact cranial nerves II through XII. No motor or sensory deficit.   PSYCH: The patient is awake, alert, oriented times three.   LABORATORY DATA: Urinalysis positive for 2+  leukocyte esterase, negative nitrite, three RBCs and three WBCs. CBC within normal limits. Creatinine 1.32. Glucose 99, BUN 6. Sodium 143,  potassium 3.5, chloride 105, bicarbonate is 26. SGOT 29, total protein is 8.6. Lipase 96. Troponin is less than 0.02. CT of the abdomen and pelvis for stone shows no urolithiasis or obstructive uropathy.   ASSESSMENT:  43. 49 year old Adrienne Flores with intractable nausea, vomiting, and diarrhea for three days. Seen in the ER yesterday, sent home, returns with similar symptoms. She reports having similar symptoms many years ago and was told it might be cyclical vomiting syndrome, although her symptoms have occurred after a long time.  2. Morbid obesity.  3. Chronic pain syndrome with chronic back and neck pain, gets occipital nerve blocks with Dr. Primus Bravo as an outpatient in the pain clinic.  4. Rheumatoid arthritis.   PLAN: 1. Admit the patient for overnight observation. We will give IV fluids, give scheduled Zofran and p.r.n. Dilaudid along with p.o. oxycodone home dose. We will start the patient on some Prilosec. Clear liquid diet. Advance as tolerated.  2. Further work-up according to the patient's clinical course. The hospital admission plan was discussed with the patient and the patient's husband, who are agreeable to it.   TIME SPENT: 50 minutes. ____________________________ Hart Rochester Posey Pronto, MD sap:bjt D:  03/19/2011 14:55:38 ET          T: 03/19/2011 15:51:46 ET         JOB#: [id number]  cc: [facility] [place] Ilda Basset MD ELECTRONICALLY SIGNED 03/22/2011 7:41

## 2014-06-06 NOTE — Consult Note (Signed)
Pt seen and examined. See Dawn Harrison's notes. Possible gastroparesis but patient on chronic narcotics. IV removed. Try to minimize pain meds. Otherwise, patient may continue to have nausea issues. Will see how she does rest of today. Thanks  Electronic Signatures: Verdie Shire (MD)  (Signed on 20-Mar-13 16:36)  Authored  Last Updated: 20-Mar-13 16:36 by Verdie Shire (MD)

## 2014-06-06 NOTE — Consult Note (Signed)
Chief Complaint:   Subjective/Chief Complaint patient feeling some better, continues with some nausea, no emesis, tolerating clears, no bm no diarrhea, abdominal pain improved , now only mostly llq, 5/10   VITAL SIGNS/ANCILLARY NOTES: **Vital Signs.:   07-Feb-13 09:37   Vital Signs Type Q 4hr   Temperature Temperature (F) 98.4   Celsius 36.8   Temperature Source oral   Pulse Pulse 85   Pulse source per Dinamap   Respirations Respirations 18   Systolic BP Systolic BP 726   Diastolic BP (mmHg) Diastolic BP (mmHg) 92   Mean BP 107   BP Source Dinamap   Pulse Ox % Pulse Ox % 92   Pulse Ox Activity Level  At rest   Oxygen Delivery Room Air/ 21 %   Brief Assessment:   Cardiac Regular    Respiratory clear BS    Gastrointestinal details normal Soft  Nondistended  No masses palpable  Bowel sounds normal  mild to moderate llq tenderness to palpatin   Routine Hem:  07-Feb-13 05:43    WBC (CBC) 4.2   RBC (CBC) 3.80   Hemoglobin (CBC) 12.0   Hematocrit (CBC) 35.1   Platelet Count (CBC) 180   MCV 92   MCH 31.5   MCHC 34.1   RDW 17.8  Routine Chem:  07-Feb-13 05:43    Glucose, Serum 81   BUN 3   Creatinine (comp) 0.98   Sodium, Serum 141   Potassium, Serum 3.6   Chloride, Serum 106   CO2, Serum 25   Calcium (Total), Serum 8.7   Osmolality (calc) 277   eGFR (African American) >60   eGFR (Non-African American) >60   Anion Gap 10  Routine Hem:  07-Feb-13 05:43    Neutrophil % 44.9   Lymphocyte % 43.5   Monocyte % 6.8   Eosinophil % 3.5   Basophil % 1.3   Neutrophil # 1.9   Lymphocyte # 1.8   Monocyte # 0.3   Eosinophil # 0.1   Basophil # 0.1   Assessment/Plan:  Assessment/Plan:   Assessment 1) acute n/v/abdominal pain/diarrheal illness.  resolving/improved.  attempt to get gastric emptying study not done, patient ate.    Plan 1) gastric emptying study tomorrow.  continue current.   Electronic Signatures: Loistine Simas (MD)  (Signed 07-Feb-13  18:01)  Authored: Chief Complaint, VITAL SIGNS/ANCILLARY NOTES, Brief Assessment, Lab Results, Assessment/Plan   Last Updated: 07-Feb-13 18:01 by Loistine Simas (MD)

## 2014-06-16 ENCOUNTER — Encounter: Payer: Self-pay | Admitting: Pain Medicine

## 2014-06-16 ENCOUNTER — Ambulatory Visit: Payer: Medicare Other | Attending: Pain Medicine | Admitting: Pain Medicine

## 2014-06-16 VITALS — BP 123/83 | HR 63 | Temp 98.5°F | Resp 16 | Ht 61.0 in | Wt 167.0 lb

## 2014-06-16 DIAGNOSIS — M7060 Trochanteric bursitis, unspecified hip: Secondary | ICD-10-CM | POA: Insufficient documentation

## 2014-06-16 DIAGNOSIS — M05671 Rheumatoid arthritis of right ankle and foot with involvement of other organs and systems: Secondary | ICD-10-CM | POA: Insufficient documentation

## 2014-06-16 DIAGNOSIS — R51 Headache: Secondary | ICD-10-CM | POA: Diagnosis not present

## 2014-06-16 DIAGNOSIS — M791 Myalgia: Secondary | ICD-10-CM | POA: Insufficient documentation

## 2014-06-16 DIAGNOSIS — M5134 Other intervertebral disc degeneration, thoracic region: Secondary | ICD-10-CM

## 2014-06-16 DIAGNOSIS — M5030 Other cervical disc degeneration, unspecified cervical region: Secondary | ICD-10-CM | POA: Diagnosis not present

## 2014-06-16 DIAGNOSIS — M542 Cervicalgia: Secondary | ICD-10-CM | POA: Diagnosis present

## 2014-06-16 DIAGNOSIS — M546 Pain in thoracic spine: Secondary | ICD-10-CM | POA: Diagnosis present

## 2014-06-16 DIAGNOSIS — M069 Rheumatoid arthritis, unspecified: Secondary | ICD-10-CM | POA: Diagnosis not present

## 2014-06-16 DIAGNOSIS — M5136 Other intervertebral disc degeneration, lumbar region: Secondary | ICD-10-CM | POA: Insufficient documentation

## 2014-06-16 DIAGNOSIS — M5137 Other intervertebral disc degeneration, lumbosacral region: Secondary | ICD-10-CM

## 2014-06-16 NOTE — Progress Notes (Signed)
Subjective:    Patient ID: Adrienne Flores, female    DOB: 01-31-1966, 49 y.o.   MRN: [id number]  HPI  Patient is 49 year old female returns to pain management Center for follow-up evaluation and treatment of pain involving the neck entire back upper and lower extremity regions patient carries diagnosis of rheumatoid arthritis and is presently undergoing endocrine evaluation. Patient with headaches as well as upper extremity pain and paresthesias and weakness. We'll avoid interventional treatment for headaches as well as pain of the cervical and upper extremity regions as discussed with patient due to patient's medical condition. Patient is well aware of need to delay interventional treatment as as has been discussed with Dr. Hoy Morn as well. On today's visit we also discussed patient's medications and at the present time there is concern regarding patient having received medications from other treating physicians form patient since there was concern regarding patient receiving medications from other treating physicians. We informed patient that since we had this concern we would be unable to prescribe medications on today's visit until we could further address the issue. The patient was understanding and agreement with suggested treatment plan. At this time we will have patient return to Dr. Hoy Morn to discuss referral to another pain management facility and to proceed with psych evaluation. We also informed patient that we wish to further discuss the issue with her and to further address all concerns. The patient will obtain information regarding this issue and return to discuss with [place] as planned. At the present time patient also is aware that we will be unable to prescribe any additional medications and that she should discuss obtaining further treatment at another pain management facility and also address psych evaluation with Dr. Hoy Morn.  Review of Systems     Objective:   Physical  Exam  Physical examination revealed patient to be with moderate tenderness to palpation of the paraspinal musculature region of the cervical region with severely limited range of motion of the cervical region. Patient was with evidence of radiation toward the upper extremity with rotation of the cervical spine with decreased grip strength as well. There was tenderness over the thoracic facet region and significant muscle spasms noted of the cervical thoracic and lumbar regions. Palpation of the lumbar paraspinal musculature region was attends to palpation with limited motion with severe tenderness over the lumbar facets especially with extension and palpation of the lumbar facets. There was tenderness to palpation of the gluteal and piriformis muscles of moderate degree. There was moderate tenderness to palpation of the greater trochanteric region. No sensory deficit of dermatomal distribution was detected. There were well-healed surgical scars of the left and right knees without increased warmth or erythema of the knees haven't been noted are without evidence of swelling area the abdomen was soft without tenderness to palpation and no costovertebral angle tenderness was noted.      Assessment & Plan:  Patient is with rheumatoid arthritis with multiple arthralgias and myalgias. Patient with degenerative disc disease of the cervical and lumbar regions with concern regarding radiculopathy of the cervical region with lumbar and lower extremity pain with concern regarding facet arthropathy without strong findings of radiculopathy. Patient with greater trochanteric bursitis headaches appear to be due to greater occipital myofascial pain related headaches, cervicogenic headaches, and component of migraine headache. Patient with evidence of fibromyalgia as well.  Plan #1 medications. There is concern regarding patient receiving medications from other physicians therefore we were unable to prescribe medications on  today's visit  as explained earlier in this dictation. Plan #2 patient is to follow up with Dr. Hoy Morn to discuss the medication issue as well as to consider patient being referred to another pain management facility as well as to discuss patient undergoing psych evaluation for there is concern regarding receiving patient receiving medications from other treating physicians. Plan #3 we will remain available to review additional information regarding the medication issue and to discuss further treatment for the patient as explained to patient on today's visit Plan #4 neurological evaluation to be considered Plan #5 neurosurgical evaluation as discussed. Patient will follow-up with Dr. Marius Ditch in this regard. Plan #6 endocrine evaluation to continue as is presently in process as an as discussed with patient on today's visit Plan #7 may consider [place] radiofrequency rhizolysis as discussed with patient since this technique is performed without the use of steroid and may be of significant benefit to patient in terms of pain of the lumbar region and cervical regions Patient advised to call pain management should there be change in condition however concerned regarding this prior to scheduled return appointment patient's understanding and in agreement cyst treatment plan

## 2014-06-16 NOTE — Patient Instructions (Addendum)
PT TO F/U WITH DR Hoy Morn AND TO HAVE PSYCH EVAL AND F/U'S  RECOMMEND PT OBTAIN TREATMENT FROM ANOTHER PAIN MGMT FACILITY AT THIS TIME F/U DR Jerilynn Mages ROY AS PLANNED FOR NEUROSURGICAL EVAL

## 2014-06-16 NOTE — Progress Notes (Signed)
Discharged at 1500, ambulatory.

## 2014-06-21 ENCOUNTER — Telehealth: Payer: Self-pay | Admitting: Pain Medicine

## 2014-06-21 NOTE — Telephone Encounter (Signed)
Pt called and stated she called several times last week and no one would call her back or talk to her about what is going on. She said she is not gonna let it go because she needs her meds. She wants to talk to someone about where Dr Primus Bravo got his information about where she got "other" scripts and had them filled at. She wants to get it figured out.

## 2014-06-21 NOTE — Telephone Encounter (Signed)
Dr. Primus Bravo,               Please advise on what you would like the nurses to tell this patient and if you would like [place] to look into this any further. If so please advise on who/where we ned to follow up with and the info you need.

## 2014-06-22 ENCOUNTER — Encounter: Payer: Medicare Other | Admitting: Pain Medicine

## 2014-06-22 NOTE — Telephone Encounter (Signed)
Dr. Primus Bravo, the question from Ms. Bunkley has not been answered. She wants to know where you got the info that she received these other narcotics. Please advise.

## 2014-06-22 NOTE — Telephone Encounter (Signed)
Adrienne Flores, due to patient having lost medications, vacuum cleaner destroying medications, spilling liquid on medications, and several visits to different ED's, and notice from insurance company, at this time it seems best to have pt f/u with Dr Hoy Morn to discuss referral to another Pain Management facility and to have psych eval to address these issues as well. May consider future treatment pending review of additional information.

## 2014-06-22 NOTE — Telephone Encounter (Signed)
The information obtained was from patient's prior insurance company

## 2014-06-23 ENCOUNTER — Telehealth: Payer: Self-pay | Admitting: Pain Medicine

## 2014-06-23 NOTE — Telephone Encounter (Signed)
As I have previously informed you Adrienne Flores as well as Adrienne Flores the patient is to discuss referral to a note the pain clinic with her primary care physician Dr. Hoy Morn the patient has had several calls regarding loss of medications and or damage of medications as well as several emergency room visits for treatment at which time the patient received medications as well  At this time I feel that it is best that patient receive further treatment at another pain management facility and the patient undergoes psych evaluation for medication use and for counseling regarding her condition which involves several medical conditions which appear to contribute to her pain

## 2014-06-23 NOTE — Telephone Encounter (Signed)
Adrienne Flores, has only had [employer]. Since 2014, she is going to call [facility] to check again, the only other meds ins she had is [employer], and will have them look into it, she thinks someone must have gotten her infor. And got meds She has never been to [facility] or [facility]. Why has no one said anything within the last 5 years. Wants some one to call her

## 2014-06-23 NOTE — Telephone Encounter (Signed)
Dr. Primus Bravo, I don't know what to tell Adrienne Flores.

## 2014-07-05 NOTE — Telephone Encounter (Signed)
Sent in error

## 2014-07-07 ENCOUNTER — Telehealth: Payer: Self-pay | Admitting: Pain Medicine

## 2014-07-07 NOTE — Telephone Encounter (Addendum)
Adrienne Flores received call from [employer] asking if issue has been resolved. She said Dr. Primus Bravo told her and Adrienne Flores that if [facility] called confirming she has not received any medications other than what he gave her for pain that he would continue to treat her. [employer] wanted to know if he was treating her for back pain with all the procedures he did, and if not then they had a big problem. Wants to know if Dr. Primus Bravo is going to treat her or not since [facility] has called and confirmed that she has not gotten any medications for pain other that what dr crisp wrote or knew about from ER visits.   No new msgs at this time

## 2014-07-08 NOTE — Telephone Encounter (Signed)
Returned patient call. No answer. Left VM.

## 2014-08-30 DIAGNOSIS — G8929 Other chronic pain: Secondary | ICD-10-CM | POA: Insufficient documentation

## 2014-08-30 DIAGNOSIS — M545 Low back pain: Secondary | ICD-10-CM

## 2014-10-22 ENCOUNTER — Encounter (HOSPITAL_COMMUNITY): Payer: Self-pay | Admitting: Emergency Medicine

## 2014-10-22 ENCOUNTER — Emergency Department (HOSPITAL_COMMUNITY)
Admission: EM | Admit: 2014-10-22 | Discharge: 2014-10-22 | Disposition: A | Discharge status: Home / Self Care | Payer: Medicare Other | Attending: Emergency Medicine | Admitting: Emergency Medicine

## 2014-10-22 DIAGNOSIS — Z79899 Other long term (current) drug therapy: Secondary | ICD-10-CM | POA: Insufficient documentation

## 2014-10-22 DIAGNOSIS — E079 Disorder of thyroid, unspecified: Secondary | ICD-10-CM | POA: Diagnosis not present

## 2014-10-22 DIAGNOSIS — E876 Hypokalemia: Secondary | ICD-10-CM | POA: Insufficient documentation

## 2014-10-22 DIAGNOSIS — R51 Headache: Secondary | ICD-10-CM | POA: Diagnosis not present

## 2014-10-22 DIAGNOSIS — M797 Fibromyalgia: Secondary | ICD-10-CM | POA: Insufficient documentation

## 2014-10-22 DIAGNOSIS — Z8619 Personal history of other infectious and parasitic diseases: Secondary | ICD-10-CM | POA: Insufficient documentation

## 2014-10-22 DIAGNOSIS — M1990 Unspecified osteoarthritis, unspecified site: Secondary | ICD-10-CM | POA: Insufficient documentation

## 2014-10-22 DIAGNOSIS — E0590 Thyrotoxicosis, unspecified without thyrotoxic crisis or storm: Secondary | ICD-10-CM | POA: Insufficient documentation

## 2014-10-22 DIAGNOSIS — R0602 Shortness of breath: Secondary | ICD-10-CM | POA: Insufficient documentation

## 2014-10-22 DIAGNOSIS — F329 Major depressive disorder, single episode, unspecified: Secondary | ICD-10-CM | POA: Diagnosis not present

## 2014-10-22 DIAGNOSIS — R1084 Generalized abdominal pain: Secondary | ICD-10-CM

## 2014-10-22 DIAGNOSIS — N390 Urinary tract infection, site not specified: Secondary | ICD-10-CM | POA: Diagnosis not present

## 2014-10-22 DIAGNOSIS — R1033 Periumbilical pain: Secondary | ICD-10-CM | POA: Diagnosis present

## 2014-10-22 LAB — URINALYSIS, ROUTINE W REFLEX MICROSCOPIC
BILIRUBIN URINE: NEGATIVE
GLUCOSE, UA: NEGATIVE mg/dL
Hgb urine dipstick: NEGATIVE
KETONES UR: NEGATIVE mg/dL
NITRITE: NEGATIVE
PH: 6.5 (ref 5.0–8.0)
Protein, ur: NEGATIVE mg/dL
SPECIFIC GRAVITY, URINE: 1.008 (ref 1.005–1.030)
Urobilinogen, UA: 1 mg/dL (ref 0.0–1.0)

## 2014-10-22 LAB — COMPREHENSIVE METABOLIC PANEL
ALBUMIN: 4.2 g/dL (ref 3.5–5.0)
ALT: 43 U/L (ref 14–54)
AST: 55 U/L — AB (ref 15–41)
Alkaline Phosphatase: 72 U/L (ref 38–126)
Anion gap: 9 (ref 5–15)
BUN: 18 mg/dL (ref 6–20)
CHLORIDE: 102 mmol/L (ref 101–111)
CO2: 25 mmol/L (ref 22–32)
CREATININE: 1.17 mg/dL — AB (ref 0.44–1.00)
Calcium: 9.1 mg/dL (ref 8.9–10.3)
GFR calc Af Amer: >60 mL/min (ref >60)
GFR calc non Af Amer: 53 mL/min — ABNORMAL LOW (ref >60)
Glucose, Bld: 81 mg/dL (ref 65–99)
POTASSIUM: 3.8 mmol/L (ref 3.5–5.1)
SODIUM: 136 mmol/L (ref 135–145)
Total Bilirubin: 0.7 mg/dL (ref 0.3–1.2)
Total Protein: 7.9 g/dL (ref 6.5–8.1)

## 2014-10-22 LAB — CBC
HEMATOCRIT: 34.1 % — AB (ref 36.0–46.0)
Hemoglobin: 11.4 g/dL — ABNORMAL LOW (ref 12.0–15.0)
MCH: 30.7 pg (ref 26.0–34.0)
MCHC: 33.4 g/dL (ref 30.0–36.0)
MCV: 91.9 fL (ref 78.0–100.0)
PLATELETS: 162 10*3/uL (ref 150–400)
RBC: 3.71 MIL/uL — ABNORMAL LOW (ref 3.87–5.11)
RDW: 14.7 % (ref 11.5–15.5)
WBC: 5.8 10*3/uL (ref 4.0–10.5)

## 2014-10-22 LAB — LIPASE, BLOOD: LIPASE: 35 U/L (ref 22–51)

## 2014-10-22 LAB — URINE MICROSCOPIC-ADD ON

## 2014-10-22 MED ORDER — SODIUM CHLORIDE 0.9 % IJ SOLN
10.0000 mL | INTRAMUSCULAR | Status: DC | PRN
  Administered 2014-10-22: 10 mL
  Filled 2014-10-22: qty 40

## 2014-10-22 MED ORDER — DICYCLOMINE HCL 20 MG PO TABS: 20.0000 mg | ORAL_TABLET | Freq: Two times a day (BID) | ORAL | Status: DC

## 2014-10-22 MED ORDER — DIPHENHYDRAMINE HCL 50 MG/ML IJ SOLN
25.0000 mg | Freq: Once | INTRAMUSCULAR | Status: AC
  Administered 2014-10-22: 25 mg via INTRAVENOUS
  Filled 2014-10-22: qty 1

## 2014-10-22 MED ORDER — HEPARIN SOD (PORK) LOCK FLUSH 100 UNIT/ML IV SOLN
500.0000 [IU] | INTRAVENOUS | Status: AC | PRN
  Administered 2014-10-22: 500 [IU]

## 2014-10-22 MED ORDER — ONDANSETRON 4 MG PO TBDP: 4.0000 mg | ORAL_TABLET | Freq: Three times a day (TID) | ORAL | Status: DC | PRN

## 2014-10-22 MED ORDER — PROCHLORPERAZINE EDISYLATE 5 MG/ML IJ SOLN
10.0000 mg | Freq: Four times a day (QID) | INTRAMUSCULAR | Status: DC | PRN
  Administered 2014-10-22: 10 mg via INTRAVENOUS
  Filled 2014-10-22: qty 2

## 2014-10-22 MED ORDER — ONDANSETRON 4 MG PO TBDP
4.0000 mg | ORAL_TABLET | Freq: Once | ORAL | Status: AC | PRN
  Administered 2014-10-22: 4 mg via ORAL

## 2014-10-22 MED ORDER — ONDANSETRON 4 MG PO TBDP
ORAL_TABLET | ORAL | Status: AC
  Filled 2014-10-22: qty 1

## 2014-10-22 MED ORDER — CEPHALEXIN 500 MG PO CAPS: 500.0000 mg | ORAL_CAPSULE | Freq: Three times a day (TID) | ORAL | Status: AC

## 2014-10-22 MED ORDER — SODIUM CHLORIDE 0.9 % IV BOLUS (SEPSIS)
1000.0000 mL | Freq: Once | INTRAVENOUS | Status: AC
  Administered 2014-10-22: 1000 mL via INTRAVENOUS

## 2014-10-22 MED ORDER — HYDROMORPHONE HCL 1 MG/ML IJ SOLN
1.0000 mg | Freq: Once | INTRAMUSCULAR | Status: AC
  Administered 2014-10-22: 1 mg via INTRAVENOUS
  Filled 2014-10-22: qty 1

## 2014-10-22 NOTE — ED Provider Notes (Signed)
CSN: [id number]     Arrival date & time 10/22/14  1613 History   First MD Initiated Contact with Patient 10/22/14 1841     Chief Complaint  Patient presents with  . Abdominal Pain  . Headache     (Consider location/radiation/quality/duration/timing/severity/associated sxs/prior Treatment) Patient is a 49 y.o. female presenting with abdominal pain.  Abdominal Pain Pain location:  Periumbilical Pain quality: sharp, shooting (to right side) and stabbing (constant periumbilica)   Pain radiates to:  R flank Pain severity:  Severe Onset quality:  Sudden Duration:  2 days Timing:  Constant Progression:  Worsening Chronicity:  New Context: not sick contacts and not suspicious food intake   Relieved by:  Nothing Worsened by:  Movement Ineffective treatments: ultram. Associated symptoms: anorexia, diarrhea (started yesterday, started out normal, then became loose), nausea and shortness of breath (when pain hits)   Associated symptoms: no chest pain, no constipation, no cough, no dysuria, no fever, no hematuria, no melena, no sore throat, no vaginal bleeding, no vaginal discharge and no vomiting     Past Medical History  Diagnosis Date  . Rheumatoid arthritis   . Gastroparesis   . Degenerative disc disease   . Fibromyalgia   . Diverticulitis   . Thyroid disease   . Lupus   . Porphyria   . Ulcerative colitis   . Hyperthyroidism   . Hemorrhoids   . DDD (degenerative disc disease), lumbar   . History of chicken pox   . Hypokalemia   . Depression   . Psychogenic disorder   . Fibromyalgia   . Rheumatoid arthritis   . Fibromyalgia    Past Surgical History  Procedure Laterality Date  . Replacement total knee Bilateral   . Abdominal hysterectomy    . Cholecystectomy    . Oophorectomy    . Knee surgery Left   . Cesarean section      x2   Family History  Problem Relation Age of Onset  . Leukemia Mother   . Arthritis Mother   . Early death Mother   . Miscarriages /  [place] Mother    Social History  Substance Use Topics  . Smoking status: Never Smoker   . Smokeless tobacco: None  . Alcohol Use: Yes     Comment: social   OB History    No data available     Review of Systems  Constitutional: Negative for fever.  HENT: Negative for sore throat.   Eyes: Negative for visual disturbance.  Respiratory: Positive for shortness of breath (when pain hits). Negative for cough.   Cardiovascular: Negative for chest pain.  Gastrointestinal: Positive for nausea, abdominal pain, diarrhea (started yesterday, started out normal, then became loose) and anorexia. Negative for vomiting, constipation and melena.  Genitourinary: Negative for dysuria, hematuria, vaginal bleeding, vaginal discharge and difficulty urinating.  Musculoskeletal: Negative for back pain and neck pain.  Skin: Negative for rash.  Neurological: Positive for headaches (when bp goes up, don't remember when it started, somtime late yesterday, started slowly, across forehead, took tylenol last night which didnt  help). Negative for dizziness, seizures, syncope, facial asymmetry, speech difficulty, weakness, light-headedness and numbness.      Allergies  Methotrexate derivatives; Morphine and related; and Relafen  Home Medications   Prior to Admission medications   Medication Sig Start Date End Date Taking? Authorizing Provider  amitriptyline (ELAVIL) 75 MG tablet Take 75 mg by mouth at bedtime.   Yes Historical Provider, MD  gabapentin (NEURONTIN) 600 MG tablet Take  600 mg by mouth 3 (three) times daily.   Yes Historical Provider, MD  levothyroxine (SYNTHROID, LEVOTHROID) 25 MCG tablet Take 25 mcg by mouth daily before breakfast. Take with 375mcg to = 318mcg total   Yes Historical Provider, MD  levothyroxine (SYNTHROID, LEVOTHROID) 300 MCG tablet Take 300 mcg by mouth daily before breakfast. Take with a 17mcg tablet to = 319mcg   Yes Historical Provider, MD  meclizine (ANTIVERT) 25 MG  tablet Take 1 tablet (25 mg total) by mouth 3 (three) times daily as needed. Patient taking differently: Take 25 mg by mouth 3 (three) times daily as needed for dizziness or nausea.  09/11/13  Yes Christopher Lawyer, PA-C  metoCLOPramide (REGLAN) 10 MG tablet Take 1 tablet (10 mg total) by mouth 4 (four) times daily. 05/03/13  Yes Carlisle Cater, PA-C  traMADol (ULTRAM) 50 MG tablet Take 1 tablet (50 mg total) by mouth every 6 (six) hours as needed. 04/17/14  Yes Benjamin Cartner, PA-C  cephALEXin (KEFLEX) 500 MG capsule Take 1 capsule (500 mg total) by mouth 3 (three) times daily. 10/22/14 10/29/14  Gareth Morgan, MD  dicyclomine (BENTYL) 20 MG tablet Take 1 tablet (20 mg total) by mouth 2 (two) times daily. 10/22/14   Gareth Morgan, MD  ondansetron (ZOFRAN ODT) 4 MG disintegrating tablet Take 1 tablet (4 mg total) by mouth every 8 (eight) hours as needed for nausea or vomiting. 10/22/14   Gareth Morgan, MD   BP 108/71 mmHg  Pulse 53  Temp(Src) 98.4 F (36.9 C) (Oral)  Resp 20  Ht 5\' 1"  (1.549 m)  Wt 173 lb (78.472 kg)  BMI 32.70 kg/m2  SpO2 92% Physical Exam  Constitutional: She is oriented to person, place, and time. She appears well-developed and well-nourished. No distress.  HENT:  Head: Normocephalic and atraumatic.  Eyes: Conjunctivae and EOM are normal.  Neck: Normal range of motion.  Cardiovascular: Normal rate, regular rhythm, normal heart sounds and intact distal pulses.  Exam reveals no gallop and no friction rub.   No murmur heard. Pulmonary/Chest: Effort normal and breath sounds normal. No respiratory distress. She has no wheezes. She has no rales.  Abdominal: Soft. She exhibits no distension. There is tenderness in the epigastric area and periumbilical area. There is no guarding, no CVA tenderness, no tenderness at McBurney's point and negative Murphy's sign.  Musculoskeletal: She exhibits no edema or tenderness.  Neurological: She is alert and oriented to person, place, and  time.  Skin: Skin is warm and dry. No rash noted. She is not diaphoretic. No erythema.  Nursing note and vitals reviewed.   ED Course  Procedures (including critical care time) Labs Review Labs Reviewed  COMPREHENSIVE METABOLIC PANEL - Abnormal; Notable for the following:    Creatinine, Ser 1.17 (*)    AST 55 (*)    GFR calc non Af Amer 53 (*)    All other components within normal limits  CBC - Abnormal; Notable for the following:    RBC 3.71 (*)    Hemoglobin 11.4 (*)    HCT 34.1 (*)    All other components within normal limits  URINALYSIS, ROUTINE W REFLEX MICROSCOPIC (NOT AT [facility]) - Abnormal; Notable for the following:    Leukocytes, UA MODERATE (*)    All other components within normal limits  URINE MICROSCOPIC-ADD ON - Abnormal; Notable for the following:    Squamous Epithelial / LPF FEW (*)    Bacteria, UA FEW (*)    All other components within normal  limits  LIPASE, BLOOD    Imaging Review No results found. I have personally reviewed and evaluated these images and lab results as part of my medical decision-making.   EKG Interpretation None      [facility] progress note: ""49 y.o. year old female with a pmhx of rheumatoid arthritis, lupus, osteoarthritis and chronic abdominal pain with nausea and vomiting who presents for evaluation of the latter problems.   *Multiple gastrointestinal complaints - The differential diagnosis for these symptoms is broad and includes multiple diverse etiologies. 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 In particular, she has features suggesting functional dyspepsia as well as irritable bowel syndrome with diarrhea predominance. These latter issues are most consistent with a post-infectious process. Previous physicians have queried AIP, which was essentially ruled out. The patient does not have any symptoms or features to suggest  inflammatory bowel disease or eosinophilic gastroenteritis. Furthermore, I do not believe that her history is entirely consistent with cyclic vomiting syndrome. Her gastroparesis, if truly present, is likely explained by her narcotic usage. Furthermore, her abdominal pain, being chronic, is likely not served by narcotics and may in fact be paradoxically worsened by these medications. The patient's Rheumatoid Arthritis appears to be quiescent making associated vasculitis unlikely as well. Over 2016, pt has had multiple further CT scans that have suggested an infectious colitis. These symptoms have largely abated in the interim and have largely returned to their baseline.  - This patient has had a very large number of CT scans. Please consider alternative studies if possible.""  MDM   Final diagnoses:  UTI (lower urinary tract infection)  Generalized abdominal pain   49 year old female with a history of rheumatoid arthritis, lupus, osteoarthritis, chronic abdominal pain with nausea and vomiting for which she seen by [facility] gastroenterology, presents with concern of abdominal pain.  DDx includes appendicitis, pancreatitis, cholecystitis, pyelonephritis, nephrolithiasis, diverticulitis, PID, ovarian torsion, ectopic pregnancy, and tuboovarian abscess.  Patient without specific pelvic pain, post menopausal, without vaginal discharge or bleeding, and have low suspicion for a pelvic etiology of her pain. Distribution of pain not consistent with nephrolithiasis. Lipase was within normal limits. CMP showed mild elevation in creatinine and AST which had been present previously. CBC showed no leukocytosis and hemoglobin at baseline. Urinalysis showed moderate leukocytes in 7-10 white blood cells, and may represent urinary tract infection.  Discussed with patient, as mentioned and her gastroenterologist clinic note, she has had multiple CT scans, many of which had shown colitis, and would avoid further CT scans or  radiation of possible.  Patient is in agreement with this. Her abdominal exam is benign, and have low suspicion for obstruction, diverticulitis.  Given possible urinary tract infection, patient was given a prescription for Keflex. It is recommended she follow up closely with her gastroenteritis regarding her symptoms.  Given HA cocktail, 1LNS, and one dose of dilaudid with improvement in symptoms. She is given a prescription for Bentyl and Zofran. Patient discharged in stable condition with understanding of reasons to return.   Gareth Morgan, MD 10/23/14 [phone]

## 2014-10-22 NOTE — ED Notes (Addendum)
Family member stopped this Probation officer to say that the pt has a pain scale 10/10 at the moment and pain has increased "to where she cant stand it anymore". Nurse notified.

## 2014-10-22 NOTE — ED Notes (Signed)
Waiting for IV team for [facility] access of port

## 2014-10-22 NOTE — ED Notes (Signed)
Pt. States unable to void at the moment. Left specimen cup and call bell for pt to alert staff when she is able to urinate.

## 2014-10-22 NOTE — Discharge Instructions (Signed)
Abdominal Pain, Women °Abdominal (stomach, pelvic, or belly) pain can be caused by many things. It is important to tell your doctor: °· The location of the pain. °· Does it come and go or is it present all the time? °· Are there things that start the pain (eating certain foods, exercise)? °· Are there other symptoms associated with the pain (fever, nausea, vomiting, diarrhea)? °All of this is helpful to know when trying to find the cause of the pain. °CAUSES  °· Stomach: virus or bacteria infection, or ulcer. °· Intestine: appendicitis (inflamed appendix), regional ileitis (Crohn's disease), ulcerative colitis (inflamed colon), irritable bowel syndrome, diverticulitis (inflamed diverticulum of the colon), or cancer of the stomach or intestine. °· Gallbladder disease or stones in the gallbladder. °· Kidney disease, kidney stones, or infection. °· Pancreas infection or cancer. °· Fibromyalgia (pain disorder). °· Diseases of the female organs: °¨ Uterus: fibroid (non-cancerous) tumors or infection. °¨ Fallopian tubes: infection or tubal pregnancy. °¨ Ovary: cysts or tumors. °¨ Pelvic adhesions (scar tissue). °¨ Endometriosis (uterus lining tissue growing in the pelvis and on the pelvic organs). °¨ Pelvic congestion syndrome (female organs filling up with blood just before the menstrual period). °¨ Pain with the menstrual period. °¨ Pain with ovulation (producing an egg). °¨ Pain with an IUD (intrauterine device, birth control) in the uterus. °¨ Cancer of the female organs. °· Functional pain (pain not caused by a disease, may improve without treatment). °· Psychological pain. °· Depression. °DIAGNOSIS  °Your doctor will decide the seriousness of your pain by doing an examination. °· Blood tests. °· X-rays. °· Ultrasound. °· CT scan (computed tomography, special type of X-ray). °· MRI (magnetic resonance imaging). °· Cultures, for infection. °· Barium enema (dye inserted in the large intestine, to better view it with  X-rays). °· Colonoscopy (looking in intestine with a lighted tube). °· Laparoscopy (minor surgery, looking in abdomen with a lighted tube). °· Major abdominal exploratory surgery (looking in abdomen with a large incision). °TREATMENT  °The treatment will depend on the cause of the pain.  °· Many cases can be observed and treated at home. °· Over-the-counter medicines recommended by your caregiver. °· Prescription medicine. °· Antibiotics, for infection. °· Birth control pills, for painful periods or for ovulation pain. °· Hormone treatment, for endometriosis. °· Nerve blocking injections. °· Physical therapy. °· Antidepressants. °· Counseling with a psychologist or psychiatrist. °· Minor or major surgery. °HOME CARE INSTRUCTIONS  °· Do not take laxatives, unless directed by your caregiver. °· Take over-the-counter pain medicine only if ordered by your caregiver. Do not take aspirin because it can cause an upset stomach or bleeding. °· Try a clear liquid diet (broth or water) as ordered by your caregiver. Slowly move to a bland diet, as tolerated, if the pain is related to the stomach or intestine. °· Have a thermometer and take your temperature several times a day, and record it. °· Bed rest and sleep, if it helps the pain. °· Avoid sexual intercourse, if it causes pain. °· Avoid stressful situations. °· Keep your follow-up appointments and tests, as your caregiver orders. °· If the pain does not go away with medicine or surgery, you may try: °¨ Acupuncture. °¨ Relaxation exercises (yoga, meditation). °¨ Group therapy. °¨ Counseling. °SEEK MEDICAL CARE IF:  °· You notice certain foods cause stomach pain. °· Your home care treatment is not helping your pain. °· You need stronger pain medicine. °· You want your IUD removed. °· You feel faint or   lightheaded. °· You develop nausea and vomiting. °· You develop a rash. °· You are having side effects or an allergy to your medicine. °SEEK IMMEDIATE MEDICAL CARE IF:  °· Your  pain does not go away or gets worse. °· You have a fever. °· Your pain is felt only in portions of the abdomen. The right side could possibly be appendicitis. The left lower portion of the abdomen could be colitis or diverticulitis. °· You are passing blood in your stools (bright red or black tarry stools, with or without vomiting). °· You have blood in your urine. °· You develop chills, with or without a fever. °· You pass out. °MAKE SURE YOU:  °· Understand these instructions. °· Will watch your condition. °· Will get help right away if you are not doing well or get worse. °Document Released: 11/26/2006 Document Revised: 06/15/2013 Document Reviewed: 12/16/2008 °[employer]® Patient Information ©2015 [employer]. This information is not intended to replace advice given to you by your health care provider. Make sure you discuss any questions you have with your health care provider. ° °

## 2014-10-22 NOTE — ED Notes (Signed)
Pt from home for eval of generalized abd pain with radiation to right flank, pt also reports nausea and loss of appetite, states was seen in [facility] ED and was discharged but pt states pain is still there. nad noted. Pt has port for IV access due to difficulty with IVs in past.

## 2014-10-22 NOTE — ED Notes (Signed)
MD at bedside. 

## 2014-11-04 ENCOUNTER — Other Ambulatory Visit: Payer: Self-pay | Admitting: Medical

## 2014-11-14 ENCOUNTER — Emergency Department
Admission: EM | Admit: 2014-11-14 | Discharge: 2014-11-14 | Disposition: A | Discharge status: Home / Self Care | Payer: Medicare Other | Attending: Emergency Medicine | Admitting: Emergency Medicine

## 2014-11-14 ENCOUNTER — Emergency Department: Payer: Medicare Other

## 2014-11-14 ENCOUNTER — Encounter: Payer: Self-pay | Admitting: Emergency Medicine

## 2014-11-14 DIAGNOSIS — R51 Headache: Secondary | ICD-10-CM | POA: Insufficient documentation

## 2014-11-14 DIAGNOSIS — R109 Unspecified abdominal pain: Secondary | ICD-10-CM | POA: Diagnosis present

## 2014-11-14 DIAGNOSIS — G8929 Other chronic pain: Secondary | ICD-10-CM

## 2014-11-14 DIAGNOSIS — M549 Dorsalgia, unspecified: Secondary | ICD-10-CM | POA: Insufficient documentation

## 2014-11-14 LAB — URINALYSIS COMPLETE WITH MICROSCOPIC (ARMC ONLY)
Bilirubin Urine: NEGATIVE
GLUCOSE, UA: NEGATIVE mg/dL
HGB URINE DIPSTICK: NEGATIVE
KETONES UR: NEGATIVE mg/dL
LEUKOCYTES UA: NEGATIVE
NITRITE: NEGATIVE
PROTEIN: NEGATIVE mg/dL
SPECIFIC GRAVITY, URINE: 1.009 (ref 1.005–1.030)
pH: 7 (ref 5.0–8.0)

## 2014-11-14 LAB — CBC WITH DIFFERENTIAL/PLATELET
BASOS PCT: 1 %
Basophils Absolute: 0 10*3/uL (ref 0–0.1)
EOS ABS: 0.1 10*3/uL (ref 0–0.7)
EOS PCT: 1 %
HCT: 34.5 % — ABNORMAL LOW (ref 35.0–47.0)
Hemoglobin: 11.7 g/dL — ABNORMAL LOW (ref 12.0–16.0)
LYMPHS ABS: 1.4 10*3/uL (ref 1.0–3.6)
Lymphocytes Relative: 27 %
MCH: 30.3 pg (ref 26.0–34.0)
MCHC: 34 g/dL (ref 32.0–36.0)
MCV: 89.3 fL (ref 80.0–100.0)
MONO ABS: 0.3 10*3/uL (ref 0.2–0.9)
MONOS PCT: 6 %
NEUTROS PCT: 65 %
Neutro Abs: 3.3 10*3/uL (ref 1.4–6.5)
PLATELETS: 155 10*3/uL (ref 150–440)
RBC: 3.86 MIL/uL (ref 3.80–5.20)
RDW: 15.5 % — AB (ref 11.5–14.5)
WBC: 5.1 10*3/uL (ref 3.6–11.0)

## 2014-11-14 LAB — COMPREHENSIVE METABOLIC PANEL
ALBUMIN: 4.3 g/dL (ref 3.5–5.0)
ALK PHOS: 82 U/L (ref 38–126)
ALT: 45 U/L (ref 14–54)
ANION GAP: 9 (ref 5–15)
AST: 61 U/L — ABNORMAL HIGH (ref 15–41)
BUN: 24 mg/dL — ABNORMAL HIGH (ref 6–20)
CALCIUM: 9.4 mg/dL (ref 8.9–10.3)
CO2: 26 mmol/L (ref 22–32)
Chloride: 103 mmol/L (ref 101–111)
Creatinine, Ser: 1.19 mg/dL — ABNORMAL HIGH (ref 0.44–1.00)
GFR calc non Af Amer: 52 mL/min — ABNORMAL LOW (ref >60)
GLUCOSE: 86 mg/dL (ref 65–99)
POTASSIUM: 3.8 mmol/L (ref 3.5–5.1)
SODIUM: 138 mmol/L (ref 135–145)
Total Bilirubin: 0.8 mg/dL (ref 0.3–1.2)
Total Protein: 8.2 g/dL — ABNORMAL HIGH (ref 6.5–8.1)

## 2014-11-14 LAB — LIPASE, BLOOD: Lipase: 27 U/L (ref 22–51)

## 2014-11-14 MED ORDER — KETOROLAC TROMETHAMINE 30 MG/ML IJ SOLN
30.0000 mg | Freq: Once | INTRAMUSCULAR | Status: AC
  Administered 2014-11-14: 30 mg via INTRAVENOUS
  Filled 2014-11-14: qty 1

## 2014-11-14 MED ORDER — METOCLOPRAMIDE HCL 5 MG/ML IJ SOLN
10.0000 mg | Freq: Once | INTRAMUSCULAR | Status: AC
  Administered 2014-11-14: 10 mg via INTRAVENOUS
  Filled 2014-11-14: qty 2

## 2014-11-14 NOTE — Discharge Instructions (Signed)

## 2014-11-14 NOTE — ED Provider Notes (Addendum)
[facility] Emergency Department Provider Note     Time seen: ----------------------------------------- 4:15 PM on 11/14/2014 -----------------------------------------    I have reviewed the triage vital signs and the nursing notes.   HISTORY  Chief Complaint Abdominal Pain; Headache; and Flank Pain    HPI Adrienne Flores is a 49 y.o. female who presents ER for abdominal pain. She states she has chronic abdominal pain but it was worse than normal starting yesterday. Patient states was similar to a gallbladder attack that she had years ago. She is subsequently had a cholecystectomy. Patient is a chronic pain patient, always has neck head and back pain. Does not have abdominal pain every day, but hasn't many days of the week. It is still worse than normal, she also has nausea. Patient is not had any vomiting or diarrhea.   Past Medical History  Diagnosis Date  . Rheumatoid arthritis ([place])   . Gastroparesis   . Degenerative disc disease   . Fibromyalgia   . Diverticulitis   . Thyroid disease   . Lupus ([place])   . 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 Ulcerative colitis ([place])   . Hyperthyroidism   . Hemorrhoids   . DDD (degenerative disc disease), lumbar   . History of chicken pox   . Hypokalemia   . Depression   . Psychogenic disorder   . Fibromyalgia   . Rheumatoid arthritis ([place])   . Fibromyalgia     Patient Active Problem List   Diagnosis Date Noted  . Rheu arthrit of right ank/ft w involv of organs and systems 06/16/2014  . DDD (degenerative disc disease), cervical 06/16/2014  . 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 DDD (degenerative disc disease), lumbosacral 06/16/2014  . Chronic abdominal pain 07/26/2013  . Nausea and vomiting 07/26/2013  . Porphyria ([place]) 07/26/2013  . Rheumatoid arthritis ([place]) 07/26/2013  . UTI (lower urinary tract infection) 07/26/2013  . Uncontrolled pain 07/26/2013  . Acute on chronic renal failure ([place])  06/27/2013  . Colitis, infectious 06/25/2013  . AKI (acute kidney injury) ([place]) 06/25/2013  . Transaminitis 06/25/2013  . Infectious colitis 06/25/2013  . Abdominal pain 11/26/2012  . Unspecified hypothyroidism 11/26/2012    Past Surgical History  Procedure Laterality Date  . Replacement total knee Bilateral   . Abdominal hysterectomy    . Cholecystectomy    . Oophorectomy    . Knee surgery Left   . Cesarean section      x2    Allergies Methotrexate derivatives; Morphine and related; and Relafen  Social History Social History  Substance Use Topics  . Smoking status: Never Smoker   . Smokeless tobacco: None  . Alcohol Use: Yes     Comment: social    Review of Systems Constitutional: Negative for fever. Eyes: Negative for visual changes. ENT: Negative for sore throat. Cardiovascular: Negative for chest pain. Respiratory: Negative for shortness of breath. Gastrointestinal: As if her abdominal pain Genitourinary: Negative for dysuria. Musculoskeletal: As if her chronic back pain Skin: Negative for rash. Neurological: Positive for headache  10-point ROS otherwise negative.  ____________________________________________   PHYSICAL EXAM:  VITAL SIGNS: ED Triage Vitals  Enc Vitals Group     BP 11/14/14 1324 131/94 mmHg     Pulse Rate 11/14/14 1324 81     Resp 11/14/14 1324 18     Temp 11/14/14 1324 98.1 F (36.7 C)     Temp Source 11/14/14 1324 Oral     SpO2 11/14/14 1324 100 %  Weight 11/14/14 1324 167 lb (75.751 kg)     Height 11/14/14 1324 5\' 1"  (1.549 m)     Head Cir --      Peak Flow --      Pain Score 11/14/14 1326 9     Pain Loc --      Pain Edu? --      Excl. in [place]? --     Constitutional: Alert and oriented. Well appearing and in no distress. Eyes: Conjunctivae are normal. PERRL. Normal extraocular movements. ENT   Head: Normocephalic and atraumatic.   Nose: No congestion/rhinnorhea.   Mouth/Throat: Mucous membranes are moist.    Neck: No stridor. Cardiovascular: Normal rate, regular rhythm. Normal and symmetric distal pulses are present in all extremities. No murmurs, rubs, or gallops. Respiratory: Normal respiratory effort without tachypnea nor retractions. Breath sounds are clear and equal bilaterally. No wheezes/rales/rhonchi. Gastrointestinal: Nonfocal tenderness, no rebound or guarding. Normal bowel sounds. Musculoskeletal: Nontender with normal range of motion in all extremities. No joint effusions.  No lower extremity tenderness nor edema. Neurologic:  Normal speech and language. No gross focal neurologic deficits are appreciated. Speech is normal. No gait instability. Skin:  Skin is warm, dry and intact. No rash noted. Psychiatric: Mood and affect are normal. Speech and behavior are normal. Patient exhibits appropriate insight and judgment. ____________________________________________  ED COURSE:  Pertinent labs & imaging results that were available during my care of the patient were reviewed by me and considered in my medical decision making (see chart for details). Patient is in no acute distress, well known to me. She has chronic pain. Will check basic labs and reevaluate. ____________________________________________    LABS (pertinent positives/negatives)  Labs Reviewed  CBC WITH DIFFERENTIAL/PLATELET - Abnormal; Notable for the following:    Hemoglobin 11.7 (*)    HCT 34.5 (*)    RDW 15.5 (*)    All other components within normal limits  COMPREHENSIVE METABOLIC PANEL - Abnormal; Notable for the following:    BUN 24 (*)    Creatinine, Ser 1.19 (*)    Total Protein 8.2 (*)    AST 61 (*)    GFR calc non Af Amer 52 (*)    All other components within normal limits  URINALYSIS COMPLETEWITH MICROSCOPIC ([facility] ONLY) - Abnormal; Notable for the following:    Color, Urine STRAW (*)    APPearance CLEAR (*)    Bacteria, UA RARE (*)    Squamous Epithelial / LPF 0-5 (*)    All other components within  normal limits  LIPASE, BLOOD     ____________________________________________  FINAL ASSESSMENT AND PLAN  Chronic pain management   Plan: Patient with labs and imaging as dictated above. Patient is in no acute distress, labs are unremarkable. She has chronic pain that has an unclear etiology. This is just one of her many chronic pain symptoms. She is encouraged to have close follow-up with her doctor and return for worsening or worrisome symptoms.   Earleen Newport, MD Patient is unhappy with my assessment of her chronic pain. He is requesting Dilaudid for pain control. I've advised we do not manage chronic pain in the ER. She is stable for outpatient follow-up  Earleen Newport, MD 11/14/14 Bosie Helper  Earleen Newport, MD 11/14/14 [phone]

## 2014-11-14 NOTE — ED Notes (Signed)
Discharge instructions and follow-up care reviewed with patient. Pt stable at discharge.

## 2014-11-14 NOTE — ED Notes (Signed)
States her abd pain is more towards RLQ and c/o flank pain as well. Pt has her appendix, burning with urination.

## 2014-11-14 NOTE — ED Notes (Signed)
Pt c/o abdominal pain since yesterday, not easing off, also c/o headache that began today. Pt appears in no distress.

## 2015-08-05 ENCOUNTER — Emergency Department: Payer: Medicare Other

## 2015-08-05 ENCOUNTER — Encounter: Payer: Self-pay | Admitting: Urgent Care

## 2015-08-05 ENCOUNTER — Emergency Department
Admission: EM | Admit: 2015-08-05 | Discharge: 2015-08-05 | Disposition: A | Discharge status: Home / Self Care | Payer: Medicare Other | Attending: Emergency Medicine | Admitting: Emergency Medicine

## 2015-08-05 ENCOUNTER — Other Ambulatory Visit: Payer: Self-pay

## 2015-08-05 DIAGNOSIS — Z5321 Procedure and treatment not carried out due to patient leaving prior to being seen by health care provider: Secondary | ICD-10-CM | POA: Insufficient documentation

## 2015-08-05 DIAGNOSIS — R079 Chest pain, unspecified: Secondary | ICD-10-CM | POA: Diagnosis present

## 2015-08-05 NOTE — ED Notes (Signed)
No beds available. Patient informed that her stay is being extended by not allowing RN to attempt blood draw. Patient states, "I just know that you are not going to be able to get it and I dont want to be stuck". Patient got out of wheelchair that she was brought to triage 2 in and walked towards the lobby. RN explained to patient that every effort would be made to get her back as soon as possible; verbalized understanding and consent. ED charge nurse made aware

## 2015-08-05 NOTE — ED Notes (Signed)
Patient to the desk. Husband states, "We are going to go try to have her seen at [facility]. It wont be 5 hours down there". This RN explained to patient that [facility] facility is likely to have an extended wait time as well, and that the wait time here will not be 5 hours as he stated. Of note, patient has been here since 0134; EKG and CXR have been done. RN has attempted to do labs, however patient refusing citing that she wants her port accessed and to be put into a room. RN explained that [facility] is likely to have at least a couple hour of wait as well; husband states, "Well it is better than 5 here". RN unsure where patient and family got that it was going to be 5 hours, however I am certain that this was not communicated to her by an ED staff member due to the longest wait only being 2.5 hours. Patient was wheeled to the door by her husband; husband states, "Thank you all and have a good night".

## 2015-08-05 NOTE — ED Notes (Signed)
Patient allowed peripheral stick x 1 by EDT. Refusing further sticks citing the fact that she has a power portacath and wants it accessed to continue care and treatment. Patient states, "They can never get blood. That is why they put it in so I wouldn't be stuck so much". ED charge nurse made aware.

## 2015-08-05 NOTE — ED Notes (Signed)
Patient presents to the ED from home with c/o generalized chest pain with (+) nausea, SOB, and diaphoresis. Pain started yesterday at 1530. When asked to describe pain, patient states, "Its just pain. That is all I can tell you". Patient denies PMH significant for cardiac problems. Reports that her SLE and fibromyalgia have been acting up as of late.

## 2015-11-13 DIAGNOSIS — K920 Hematemesis: Secondary | ICD-10-CM

## 2015-11-13 HISTORY — DX: Hematemesis: K92.0

## 2015-12-07 DIAGNOSIS — Z9114 Patient's other noncompliance with medication regimen: Secondary | ICD-10-CM | POA: Insufficient documentation

## 2015-12-09 ENCOUNTER — Observation Stay (HOSPITAL_COMMUNITY): Payer: Medicare Other | Admitting: Anesthesiology

## 2015-12-09 ENCOUNTER — Encounter (HOSPITAL_COMMUNITY): Payer: Self-pay | Admitting: Emergency Medicine

## 2015-12-09 ENCOUNTER — Observation Stay (HOSPITAL_COMMUNITY): Payer: Medicare Other

## 2015-12-09 ENCOUNTER — Encounter (HOSPITAL_COMMUNITY)
Admission: EM | Disposition: A | Discharge status: Skilled Nursing Facility | Payer: Self-pay | Source: Home / Self Care | Attending: Internal Medicine

## 2015-12-09 ENCOUNTER — Inpatient Hospital Stay (HOSPITAL_COMMUNITY)
Admission: EM | Admit: 2015-12-09 | Discharge: 2015-12-14 | DRG: 377 | Disposition: A | Discharge status: Skilled Nursing Facility | Payer: Medicare Other | Attending: Internal Medicine | Admitting: Internal Medicine

## 2015-12-09 DIAGNOSIS — E039 Hypothyroidism, unspecified: Secondary | ICD-10-CM | POA: Diagnosis present

## 2015-12-09 DIAGNOSIS — G894 Chronic pain syndrome: Secondary | ICD-10-CM | POA: Diagnosis present

## 2015-12-09 DIAGNOSIS — M549 Dorsalgia, unspecified: Secondary | ICD-10-CM | POA: Diagnosis present

## 2015-12-09 DIAGNOSIS — Z6828 Body mass index (BMI) 28.0-28.9, adult: Secondary | ICD-10-CM

## 2015-12-09 DIAGNOSIS — G8929 Other chronic pain: Secondary | ICD-10-CM | POA: Diagnosis not present

## 2015-12-09 DIAGNOSIS — D62 Acute posthemorrhagic anemia: Secondary | ICD-10-CM | POA: Diagnosis present

## 2015-12-09 DIAGNOSIS — R109 Unspecified abdominal pain: Secondary | ICD-10-CM

## 2015-12-09 DIAGNOSIS — D649 Anemia, unspecified: Secondary | ICD-10-CM | POA: Diagnosis present

## 2015-12-09 DIAGNOSIS — K920 Hematemesis: Principal | ICD-10-CM | POA: Diagnosis present

## 2015-12-09 DIAGNOSIS — R7401 Elevation of levels of liver transaminase levels: Secondary | ICD-10-CM | POA: Diagnosis present

## 2015-12-09 DIAGNOSIS — Z9114 Patient's other noncompliance with medication regimen: Secondary | ICD-10-CM

## 2015-12-09 DIAGNOSIS — K3184 Gastroparesis: Secondary | ICD-10-CM | POA: Diagnosis present

## 2015-12-09 DIAGNOSIS — Z806 Family history of leukemia: Secondary | ICD-10-CM

## 2015-12-09 DIAGNOSIS — Z96653 Presence of artificial knee joint, bilateral: Secondary | ICD-10-CM | POA: Diagnosis present

## 2015-12-09 DIAGNOSIS — E876 Hypokalemia: Secondary | ICD-10-CM | POA: Diagnosis present

## 2015-12-09 DIAGNOSIS — M797 Fibromyalgia: Secondary | ICD-10-CM | POA: Diagnosis present

## 2015-12-09 DIAGNOSIS — K5900 Constipation, unspecified: Secondary | ICD-10-CM

## 2015-12-09 DIAGNOSIS — M069 Rheumatoid arthritis, unspecified: Secondary | ICD-10-CM | POA: Diagnosis present

## 2015-12-09 DIAGNOSIS — Z90710 Acquired absence of both cervix and uterus: Secondary | ICD-10-CM

## 2015-12-09 DIAGNOSIS — E871 Hypo-osmolality and hyponatremia: Secondary | ICD-10-CM | POA: Diagnosis present

## 2015-12-09 DIAGNOSIS — R490 Dysphonia: Secondary | ICD-10-CM | POA: Diagnosis present

## 2015-12-09 DIAGNOSIS — E43 Unspecified severe protein-calorie malnutrition: Secondary | ICD-10-CM | POA: Diagnosis present

## 2015-12-09 DIAGNOSIS — Z8261 Family history of arthritis: Secondary | ICD-10-CM

## 2015-12-09 DIAGNOSIS — R740 Nonspecific elevation of levels of transaminase and lactic acid dehydrogenase [LDH]: Secondary | ICD-10-CM

## 2015-12-09 DIAGNOSIS — R634 Abnormal weight loss: Secondary | ICD-10-CM | POA: Diagnosis present

## 2015-12-09 DIAGNOSIS — Z79891 Long term (current) use of opiate analgesic: Secondary | ICD-10-CM

## 2015-12-09 DIAGNOSIS — K922 Gastrointestinal hemorrhage, unspecified: Secondary | ICD-10-CM

## 2015-12-09 HISTORY — DX: Cyclical vomiting syndrome unrelated to migraine: R11.15

## 2015-12-09 HISTORY — DX: Hypothyroidism, unspecified: E03.9

## 2015-12-09 HISTORY — DX: Hematemesis: K92.0

## 2015-12-09 HISTORY — DX: Other chronic pain: G89.29

## 2015-12-09 HISTORY — DX: Noninfective gastroenteritis and colitis, unspecified: K52.9

## 2015-12-09 HISTORY — PX: ESOPHAGOGASTRODUODENOSCOPY: SHX5428

## 2015-12-09 HISTORY — DX: Unspecified abdominal pain: R10.9

## 2015-12-09 LAB — CBC WITH DIFFERENTIAL/PLATELET
BASOS ABS: 0 10*3/uL (ref 0.0–0.1)
Basophils Relative: 1 %
EOS ABS: 0 10*3/uL (ref 0.0–0.7)
Eosinophils Relative: 1 %
HCT: 26 % — ABNORMAL LOW (ref 36.0–46.0)
HEMOGLOBIN: 8.4 g/dL — AB (ref 12.0–15.0)
LYMPHS PCT: 17 %
Lymphs Abs: 0.7 10*3/uL (ref 0.7–4.0)
MCH: 29.9 pg (ref 26.0–34.0)
MCHC: 32.3 g/dL (ref 30.0–36.0)
MCV: 92.5 fL (ref 78.0–100.0)
MONO ABS: 0.3 10*3/uL (ref 0.1–1.0)
Monocytes Relative: 7 %
NEUTROS PCT: 74 %
Neutro Abs: 3.3 10*3/uL (ref 1.7–7.7)
PLATELETS: 176 10*3/uL (ref 150–400)
RBC: 2.81 MIL/uL — AB (ref 3.87–5.11)
RDW: 16.8 % — ABNORMAL HIGH (ref 11.5–15.5)
WBC: 4.3 10*3/uL (ref 4.0–10.5)

## 2015-12-09 LAB — COMPREHENSIVE METABOLIC PANEL
ALBUMIN: 3.2 g/dL — AB (ref 3.5–5.0)
ALT: 37 U/L (ref 14–54)
ANION GAP: 15 (ref 5–15)
AST: 62 U/L — ABNORMAL HIGH (ref 15–41)
Alkaline Phosphatase: 84 U/L (ref 38–126)
BILIRUBIN TOTAL: 1.3 mg/dL — AB (ref 0.3–1.2)
BUN: 13 mg/dL (ref 6–20)
CALCIUM: 8.2 mg/dL — AB (ref 8.9–10.3)
CO2: 16 mmol/L — ABNORMAL LOW (ref 22–32)
Chloride: 99 mmol/L — ABNORMAL LOW (ref 101–111)
Creatinine, Ser: 0.88 mg/dL (ref 0.44–1.00)
GFR calc non Af Amer: >60 mL/min (ref >60)
GLUCOSE: 78 mg/dL (ref 65–99)
POTASSIUM: 3.7 mmol/L (ref 3.5–5.1)
Sodium: 130 mmol/L — ABNORMAL LOW (ref 135–145)
TOTAL PROTEIN: 6.3 g/dL — AB (ref 6.5–8.1)

## 2015-12-09 LAB — POC OCCULT BLOOD, ED: FECAL OCCULT BLD: NEGATIVE

## 2015-12-09 LAB — LIPASE, BLOOD: LIPASE: 26 U/L (ref 11–51)

## 2015-12-09 LAB — FERRITIN: FERRITIN: 257 ng/mL (ref 11–307)

## 2015-12-09 LAB — VITAMIN B12: Vitamin B-12: 959 pg/mL — ABNORMAL HIGH (ref 180–914)

## 2015-12-09 LAB — RETICULOCYTES
RBC.: 1.8 MIL/uL — ABNORMAL LOW (ref 3.87–5.11)
RETIC CT PCT: 2.5 % (ref 0.4–3.1)
Retic Count, Absolute: 45 10*3/uL (ref 19.0–186.0)

## 2015-12-09 LAB — FOLATE: Folate: 6.9 ng/mL (ref >5.9)

## 2015-12-09 LAB — PREALBUMIN: Prealbumin: 13.5 mg/dL — ABNORMAL LOW (ref 18–38)

## 2015-12-09 LAB — PHOSPHORUS: Phosphorus: 2.5 mg/dL (ref 2.5–4.6)

## 2015-12-09 LAB — MAGNESIUM: Magnesium: 1.7 mg/dL (ref 1.7–2.4)

## 2015-12-09 LAB — IRON AND TIBC
Iron: 61 ug/dL (ref 28–170)
SATURATION RATIOS: 25 % (ref 10.4–31.8)
TIBC: 245 ug/dL — AB (ref 250–450)
UIBC: 184 ug/dL

## 2015-12-09 LAB — ETHANOL: Alcohol, Ethyl (B): <5 mg/dL (ref <5)

## 2015-12-09 LAB — TSH: TSH: 37.434 u[IU]/mL — ABNORMAL HIGH (ref 0.350–4.500)

## 2015-12-09 SURGERY — EGD (ESOPHAGOGASTRODUODENOSCOPY)
Anesthesia: Monitor Anesthesia Care

## 2015-12-09 MED ORDER — METOCLOPRAMIDE HCL 5 MG/ML IJ SOLN
10.0000 mg | Freq: Four times a day (QID) | INTRAMUSCULAR | Status: DC
  Administered 2015-12-09 – 2015-12-14 (×20): 10 mg via INTRAVENOUS
  Filled 2015-12-09 (×20): qty 2

## 2015-12-09 MED ORDER — HYDROMORPHONE HCL 1 MG/ML IJ SOLN
1.0000 mg | Freq: Once | INTRAMUSCULAR | Status: AC
  Administered 2015-12-09: 1 mg via INTRAVENOUS
  Filled 2015-12-09: qty 1

## 2015-12-09 MED ORDER — MORPHINE SULFATE (PF) 4 MG/ML IV SOLN
1.0000 mg | INTRAVENOUS | Status: DC | PRN
  Administered 2015-12-09 (×4): 2 mg via INTRAVENOUS
  Administered 2015-12-09 – 2015-12-10 (×6): 4 mg via INTRAVENOUS
  Administered 2015-12-10: 2 mg via INTRAVENOUS
  Administered 2015-12-10 – 2015-12-12 (×9): 4 mg via INTRAVENOUS
  Filled 2015-12-09 (×22): qty 1

## 2015-12-09 MED ORDER — ONDANSETRON HCL 4 MG PO TABS
4.0000 mg | ORAL_TABLET | Freq: Four times a day (QID) | ORAL | Status: DC | PRN
  Administered 2015-12-12: 4 mg via ORAL
  Filled 2015-12-09 (×2): qty 1

## 2015-12-09 MED ORDER — ONDANSETRON HCL 4 MG/2ML IJ SOLN
4.0000 mg | Freq: Four times a day (QID) | INTRAMUSCULAR | Status: DC | PRN
  Administered 2015-12-09 – 2015-12-13 (×8): 4 mg via INTRAVENOUS
  Filled 2015-12-09 (×9): qty 2

## 2015-12-09 MED ORDER — IOPAMIDOL (ISOVUE-300) INJECTION 61%
INTRAVENOUS | Status: AC
  Administered 2015-12-09: 75 mL via INTRAVENOUS
  Filled 2015-12-09: qty 100

## 2015-12-09 MED ORDER — TRAMADOL HCL 50 MG PO TABS: 50.0000 mg | ORAL_TABLET | Freq: Four times a day (QID) | ORAL | Status: DC | PRN

## 2015-12-09 MED ORDER — ACETAMINOPHEN 650 MG RE SUPP: 650.0000 mg | Freq: Four times a day (QID) | RECTAL | Status: DC | PRN

## 2015-12-09 MED ORDER — LEVOTHYROXINE SODIUM 25 MCG PO TABS
25.0000 ug | ORAL_TABLET | Freq: Every day | ORAL | Status: DC
  Administered 2015-12-09 – 2015-12-14 (×6): 25 ug via ORAL
  Filled 2015-12-09 (×7): qty 1

## 2015-12-09 MED ORDER — ONDANSETRON 4 MG PO TBDP: 4.0000 mg | ORAL_TABLET | Freq: Three times a day (TID) | ORAL | Status: DC | PRN

## 2015-12-09 MED ORDER — SODIUM CHLORIDE 0.9% FLUSH
10.0000 mL | INTRAVENOUS | Status: DC | PRN
  Administered 2015-12-10: 10 mL
  Administered 2015-12-10 – 2015-12-11 (×2): 20 mL
  Administered 2015-12-12: 30 mL
  Administered 2015-12-13 (×2): 10 mL
  Filled 2015-12-09 (×6): qty 40

## 2015-12-09 MED ORDER — LEVOTHYROXINE SODIUM 100 MCG PO TABS
300.0000 ug | ORAL_TABLET | Freq: Every day | ORAL | Status: DC
  Administered 2015-12-09 – 2015-12-14 (×6): 300 ug via ORAL
  Filled 2015-12-09: qty 3
  Filled 2015-12-09: qty 2
  Filled 2015-12-09 (×4): qty 3
  Filled 2015-12-09: qty 4
  Filled 2015-12-09: qty 3

## 2015-12-09 MED ORDER — FENTANYL CITRATE (PF) 100 MCG/2ML IJ SOLN
100.0000 ug | Freq: Once | INTRAMUSCULAR | Status: AC
  Administered 2015-12-09: 100 ug via INTRAVENOUS
  Filled 2015-12-09: qty 2

## 2015-12-09 MED ORDER — METRONIDAZOLE IN NACL 5-0.79 MG/ML-% IV SOLN
500.0000 mg | Freq: Three times a day (TID) | INTRAVENOUS | Status: DC
  Administered 2015-12-09 – 2015-12-10 (×4): 500 mg via INTRAVENOUS
  Filled 2015-12-09 (×4): qty 100

## 2015-12-09 MED ORDER — METOCLOPRAMIDE HCL 10 MG PO TABS: 10.0000 mg | ORAL_TABLET | Freq: Four times a day (QID) | ORAL | Status: DC

## 2015-12-09 MED ORDER — ALTEPLASE 2 MG IJ SOLR
2.0000 mg | Freq: Once | INTRAMUSCULAR | Status: AC
  Administered 2015-12-09: 2 mg
  Filled 2015-12-09: qty 2

## 2015-12-09 MED ORDER — DIPHENHYDRAMINE HCL 50 MG/ML IJ SOLN
12.5000 mg | Freq: Four times a day (QID) | INTRAMUSCULAR | Status: DC | PRN
  Filled 2015-12-09: qty 1

## 2015-12-09 MED ORDER — FAMOTIDINE IN NACL 20-0.9 MG/50ML-% IV SOLN
20.0000 mg | Freq: Two times a day (BID) | INTRAVENOUS | Status: DC
  Administered 2015-12-09 – 2015-12-11 (×6): 20 mg via INTRAVENOUS
  Filled 2015-12-09 (×8): qty 50

## 2015-12-09 MED ORDER — PROPOFOL 10 MG/ML IV BOLUS
INTRAVENOUS | Status: DC | PRN
  Administered 2015-12-09: 20 mg via INTRAVENOUS

## 2015-12-09 MED ORDER — GABAPENTIN 600 MG PO TABS
600.0000 mg | ORAL_TABLET | Freq: Three times a day (TID) | ORAL | Status: DC
  Administered 2015-12-09 – 2015-12-14 (×15): 600 mg via ORAL
  Filled 2015-12-09 (×16): qty 1

## 2015-12-09 MED ORDER — DIPHENHYDRAMINE HCL 50 MG/ML IJ SOLN
25.0000 mg | Freq: Once | INTRAMUSCULAR | Status: AC
  Administered 2015-12-09: 25 mg via INTRAVENOUS
  Filled 2015-12-09: qty 1

## 2015-12-09 MED ORDER — PROPOFOL 500 MG/50ML IV EMUL
INTRAVENOUS | Status: DC | PRN
  Administered 2015-12-09: 125 ug/kg/min via INTRAVENOUS

## 2015-12-09 MED ORDER — SODIUM CHLORIDE 0.9 % IV SOLN
INTRAVENOUS | Status: DC
  Administered 2015-12-09: 15:00:00 via INTRAVENOUS
  Administered 2015-12-09: 1000 mL via INTRAVENOUS
  Administered 2015-12-10: 03:00:00 via INTRAVENOUS

## 2015-12-09 MED ORDER — CIPROFLOXACIN IN D5W 400 MG/200ML IV SOLN
400.0000 mg | Freq: Two times a day (BID) | INTRAVENOUS | Status: DC
  Administered 2015-12-09 – 2015-12-11 (×6): 400 mg via INTRAVENOUS
  Filled 2015-12-09 (×6): qty 200

## 2015-12-09 MED ORDER — DIPHENHYDRAMINE HCL 50 MG/ML IJ SOLN
25.0000 mg | Freq: Four times a day (QID) | INTRAMUSCULAR | Status: DC | PRN
  Administered 2015-12-09 – 2015-12-10 (×2): 25 mg via INTRAVENOUS
  Filled 2015-12-09 (×3): qty 1

## 2015-12-09 MED ORDER — ONDANSETRON HCL 4 MG/2ML IJ SOLN
4.0000 mg | Freq: Once | INTRAMUSCULAR | Status: AC
  Administered 2015-12-09: 4 mg via INTRAVENOUS
  Filled 2015-12-09: qty 2

## 2015-12-09 MED ORDER — SODIUM CHLORIDE 0.9 % IV BOLUS (SEPSIS)
1000.0000 mL | Freq: Once | INTRAVENOUS | Status: AC
  Administered 2015-12-09: 1000 mL via INTRAVENOUS

## 2015-12-09 MED ORDER — ACETAMINOPHEN 325 MG PO TABS
650.0000 mg | ORAL_TABLET | Freq: Four times a day (QID) | ORAL | Status: DC | PRN
  Filled 2015-12-09: qty 2

## 2015-12-09 MED ORDER — ONDANSETRON HCL 4 MG/2ML IJ SOLN
INTRAMUSCULAR | Status: AC
  Filled 2015-12-09: qty 2

## 2015-12-09 MED ORDER — METOCLOPRAMIDE HCL 5 MG/ML IJ SOLN
10.0000 mg | Freq: Once | INTRAMUSCULAR | Status: AC
  Administered 2015-12-09: 10 mg via INTRAVENOUS
  Filled 2015-12-09: qty 2

## 2015-12-09 MED ORDER — HYDROMORPHONE HCL 1 MG/ML IJ SOLN: 0.5000 mg | Freq: Two times a day (BID) | INTRAMUSCULAR | Status: DC | PRN

## 2015-12-09 NOTE — Consult Note (Signed)
Consultation  Referring Provider:  Dr. Aggie Moats    Primary Care Physician:  [name], MD Primary Gastroenterologist:    Althia Forts     Reason for Consultation:  Symptomatic anemia            HPI:   Adrienne Flores is a 50 y.o. female with a past medical history significant for degenerative disc disease, depression, diverticulitis, fibromyalgia, gastroparesis, hemorrhoids, hyperthyroidism, lupus, rheumatoid arthritis and ulcerative colitis, who presented to the ED on 12/09/2015 with complaint of 2 weeks of recurrent emesis in the setting of chronic abdominal pain. Recently the patient has had diagnosis of progressive anemia and unexplained weight loss along with chronic hoarseness, hypothyroidism and recent infectious colitis treated in June 2017.    In ED, patient reported worsening abdominal pain and decrease in weight loss sating in the past 2 weeks she has decreased her weight from 160 to do 143 pounds. She did report emesis which was primarily food products in the past 24 hours as well as several episodes of bright red to bloody red hematemesis. There was no hematemesis or emesis while in the ER. Since arrival to the ER the patient complained of significant pain and was requesting Dilaudid. She was given fentanyl for 1 dose patient was found have significantly worsened anemia with hemoglobin of 8.4 down from around 11 in July and was found to be hyponatremic with a sodium of 130 with normal renal function.    At time of my interview this morning, the patient tells me that she has been losing her appetite for the past few months and has been noticing a decrease in weight during this time, see above. She explains that this is mostly due to the fact that when she would eat her lower abdominal pain would worsen within 10-15 minutes which made her "scared to eat". This is different from her chronic abdominal pain which she has been experiencing for years now. Over the past week and a half to  2 the patient notes that she has also developed vomiting when she "puts anything in my mouth", she has been sucking on ice chips over this time period and even sometimes when the water gets to her stomach she will vomit it up. She has had too many episodes of vomiting to count over this two-week time span and on Wednesday of this week, 12/07/15 noted some bright red blood in her vomit for the first time. She tells me that this looked like "quite a lot of blood". She notes that since that time she was vomiting every 20 minutes with some bright red blood in this. She has not vomited since in the hospital, though is on anti-nausea medications. She does continue with some nausea and is currently only sucking on ice chips. Patient tells me she also has report from her husband that there was some bright red blood mixed in with dark "black" stool about a week and a half ago when she was going to the bathroom "becuase I don't look at my stool, but he did", but since then she has had normal daily bowel movements which are solid with no blood.  Patient denies fever, chills, dysphagia, heartburn, reflux, sick contacts, new medications, changes in diet, anticoagulation or use of NSAIDs.  ED Course:  Vital Signs: BP 114/77   Pulse 80   Temp 97.9 F (36.6 C) (Axillary)   Resp 14   SpO2 100%  Lab data: Sodium 1:30, potassium 3.7, chloride 99, CO2  16, BUN 13, creatinine 0.88, anion gap 15, alkaline phosphatase 84, albumin 3.2, lipase 26, AST 62, ALT 37, total protein 6.3, total bilirubin 1.3, white count 4300 with normal differential, hemoglobin 8.4, platelets 176,000 Medications and treatments: Normal saline bolus 1 L 1, Zofran 4 mg IV 1, fentanyl 100 g IV 1, Reglan 10 mg IV 1 lverlying the left hemipelvis. No acute osseous abnormality. Previous GI workup: 12/06/15-abdominal x-ray at [facility]: Small by basilar opacities, likely atelectasis. Cholecystectomy clips. Mildly prominent loops of air-filled small bowel  overlying the mid abdomen and nonobstructive pattern. Distal bowel gas is noted. Mild colonic stool burden. No abnormal soft tissue calcifications. Clip overlying the left hemipelvis. No acute osseous abnormality. 06/05/15-CT abdomen and pelvis with contrast: Impression: Mild wall thickening and pericolonic inflammatory stranding throughout the large bowel, most pronounced in the cecum and ascending colon, these findings are consistent with mild colitis. (Looking back it appears patient has had 5 CTs of her abdomen since January 2016 on questioning this mild wall thickening, normal CT 08/20/13, began being abnormal with bowel wall thickening on 03/07/14) 04/30/14-office visit at [facility] with Dr. 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 In particular patient had features suggesting functional dyspepsia as well as irritable bowel syndrome with diarrhea predominance. The patient did not have symptoms or features to suggest inflammatory bowel disease or eosinophilic gastroenteritis. Is also thought her history is not entirely consistent with cyclic vomiting syndrome. Patient was sent referral to the GI psychiatrist. 12/03/13-colonoscopy, Dr. Kennith Gain at [facility] health care: Impression: Internal hemorrhoids, diverticulosis in the sigmoid colon, exam was otherwise normal; pathology: Unremarkable colonic mucosa, no dysplasia or inflammatory process identified, no evidence for microscopic colitis 12/03/13-upper endoscopy, Dr. Kennith Gain at [facility] health care: Impression: Normal esophagus, normal stomach, normal examined duodenum; pathology: No significant pathologic abnormality, no villous abnormalities in the duodenum 06/26/13-consult by [name] during hospital admission: At that time patient described 8 years of diffuse sharp abdominal pain that would worsen  after eating with frequent urgency postprandially with nonbloody diarrhea that would follow. Apparently she saw Dr. Marene Lenz in January 2014 at [place] and a colonoscopy was done that month. The report is not in Epic but biopsies were negative for inflammation or microscopic colitis. She also no showed for an appointment to [facility] previously. [facility] shows possible cyclical vomiting syndrome. Plan: At that time was discussed previous workup at [facility] in 2014 was negative. It was discussed that if she had lupus that could cause some GI symptoms. 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 Further evaluation with colonoscopy and EGD were not recommended at that time. Porphyria tests were pending.  Past Medical History:  Diagnosis Date  . DDD (degenerative disc disease), lumbar   . Degenerative disc disease   . Depression   . Diverticulitis   . Fibromyalgia   . Fibromyalgia   . Fibromyalgia   . Gastroparesis   . H/O degenerative disc disease   . Hemorrhoids   . History of chicken pox   . Hyperthyroidism   . Hypokalemia   . Lupus   . 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 Psychogenic disorder   . Rheumatoid arthritis ([place])   . Rheumatoid arthritis ([place])   . Thyroid disease   . Ulcerative colitis ([place])  Past Surgical History:  Procedure Laterality Date  . ABDOMINAL HYSTERECTOMY    . CESAREAN SECTION     x2  . CHOLECYSTECTOMY    . KNEE SURGERY Left   . OOPHORECTOMY    . REPLACEMENT TOTAL KNEE Bilateral     Family History  Problem Relation Age of Onset  . Leukemia Mother   . Arthritis Mother   . Early death Mother   . Miscarriages / [place] Mother    Social History  Substance Use Topics  . Smoking status: Never Smoker  . Smokeless tobacco: Never Used  . Alcohol use Yes     Comment: social    Prior to Admission medications   Medication Sig Start Date End Date Taking? Authorizing Provider    amitriptyline (ELAVIL) 75 MG tablet Take 75 mg by mouth at bedtime.   Yes Historical Provider, MD  dicyclomine (BENTYL) 20 MG tablet Take 1 tablet (20 mg total) by mouth 2 (two) times daily. 10/22/14  Yes Gareth Morgan, MD  gabapentin (NEURONTIN) 600 MG tablet Take 600 mg by mouth 3 (three) times daily.   Yes Historical Provider, MD  levothyroxine (SYNTHROID, LEVOTHROID) 25 MCG tablet Take 25 mcg by mouth daily before breakfast. Take with 359mcg to = 346mcg total   Yes Historical Provider, MD  levothyroxine (SYNTHROID, LEVOTHROID) 300 MCG tablet Take 300 mcg by mouth daily before breakfast. Take with a 2mcg tablet to = 364mcg   Yes Historical Provider, MD  meclizine (ANTIVERT) 25 MG tablet Take 1 tablet (25 mg total) by mouth 3 (three) times daily as needed. Patient taking differently: Take 25 mg by mouth 3 (three) times daily as needed for dizziness or nausea.  09/11/13  Yes Christopher Lawyer, PA-C  metoCLOPramide (REGLAN) 10 MG tablet Take 1 tablet (10 mg total) by mouth 4 (four) times daily. 05/03/13  Yes Carlisle Cater, PA-C  ondansetron (ZOFRAN ODT) 4 MG disintegrating tablet Take 1 tablet (4 mg total) by mouth every 8 (eight) hours as needed for nausea or vomiting. 10/22/14  Yes Gareth Morgan, MD  oxyCODONE-acetaminophen (PERCOCET/ROXICET) 5-325 MG tablet Take 1 tablet by mouth every 6 (six) hours as needed for pain. 12/02/15  Yes Historical Provider, MD  potassium chloride SA (K-DUR,KLOR-CON) 20 MEQ tablet Take 20 mEq by mouth daily. 11/18/15 11/17/16 Yes Historical Provider, MD    Current Facility-Administered Medications  Medication Dose Route Frequency Provider Last Rate Last Dose  . 0.9 %  sodium chloride infusion   Intravenous Continuous Samella Parr, NP      . acetaminophen (TYLENOL) tablet 650 mg  650 mg Oral Q6H PRN Samella Parr, NP       Or  . acetaminophen (TYLENOL) suppository 650 mg  650 mg Rectal Q6H PRN Samella Parr, NP      . diphenhydrAMINE (BENADRYL) injection 25 mg   25 mg Intravenous Q6H PRN Samella Parr, NP      . famotidine (PEPCID) IVPB 20 mg premix  20 mg Intravenous Q12H Samella Parr, NP   20 mg at 12/09/15 1015  . gabapentin (NEURONTIN) tablet 600 mg  600 mg Oral TID Samella Parr, NP   600 mg at 12/09/15 0925  . levothyroxine (SYNTHROID, LEVOTHROID) tablet 25 mcg  25 mcg Oral QAC breakfast Samella Parr, NP   25 mcg at 12/09/15 0926  . levothyroxine (SYNTHROID, LEVOTHROID) tablet 300 mcg  300 mcg Oral QAC breakfast Samella Parr, NP   300 mcg at 12/09/15 0925  . metoCLOPramide (  REGLAN) injection 10 mg  10 mg Intravenous Q6H Samella Parr, NP   10 mg at 12/09/15 1016  . morphine 4 MG/ML injection 1-4 mg  1-4 mg Intravenous Q2H PRN Samella Parr, NP   4 mg at 12/09/15 [id number]  . ondansetron (ZOFRAN) tablet 4 mg  4 mg Oral Q6H PRN Samella Parr, NP       Or  . ondansetron [facility]) injection 4 mg  4 mg Intravenous Q6H PRN Samella Parr, NP   4 mg at 12/09/15 0926  . sodium chloride flush (NS) 0.9 % injection 10-40 mL  10-40 mL Intracatheter PRN Everlene Balls, MD        Allergies as of 12/09/2015 - Review Complete 12/09/2015  Allergen Reaction Noted  . Methotrexate derivatives Other (See Comments) 11/10/2012  . Morphine and related Hives and Itching 12/02/2012  . Relafen [nabumetone] Other (See Comments) 01/05/2013     Review of Systems:     Constitutional: Positive for weight loss and weakness No fever, chills or fatigue HEENT: Eyes: No change in vision               Ears, Nose, Throat:  No change in hearing or congestion Skin: No rash or itching Cardiovascular: No chest pain, chest pressure or palpitations   Respiratory: No SOB or cough Gastrointestinal: See HPI and otherwise negative Genitourinary: No dysuria or change in urinary frequency Neurological: No headache, dizziness or syncope Musculoskeletal: No new muscle or joint pain Hematologic: No bruising Psychiatric: Positive history of depression, No anxiety    Physical  Exam:  Vital signs in last 24 hours: Temp:  [97.9 F (36.6 C)-98.4 F (36.9 C)] 98.4 F (36.9 C) (10/27 0919) Pulse Rate:  [77-105] 77 (10/27 0919) Resp:  [10-22] 16 (10/27 0919) BP: (101-125)/(66-93) 105/70 (10/27 0919) SpO2:  [100 %] 100 % (10/27 0919) Weight:  [151 lb 0.2 oz (68.5 kg)] 151 lb 0.2 oz (68.5 kg) (10/27 0919)   General: Caucasian female appears to be in mild distress, Well developed, Well nourished, alert and cooperative Head:  Normocephalic and atraumatic. Eyes:   PEERL, EOMI. No icterus. Conjunctiva pink. Ears:  Normal auditory acuity. Neck:  Supple Throat: Oral cavity and pharynx without inflammation, swelling or lesion. Malodorous breath Lungs: Respirations even and unlabored. Lungs clear to auscultation bilaterally.   No wheezes, crackles, or rhonchi. Port-A-Cath in place right upper chest Heart: Normal S1, S2. No MRG. Regular rate and rhythm. No peripheral edema, cyanosis or pallor.  Abdomen:  Soft, nondistended, nontender. No rebound or guarding. Normal bowel sounds. No appreciable masses or hepatomegaly. Rectal:  Not performed.  Msk:  Symmetrical without gross deformities. Peripheral pulses intact.  Extremities:  Without edema, no deformity or joint abnormality. Normal ROM, normal sensation. Neurologic:  Alert and  oriented x4;  grossly normal neurologically. CN II-XII intact.  Skin:   Dry and intact without significant lesions or rashes. Psychiatric: Oriented to person, place and time. Anxious mood.   LAB RESULTS:  Recent Labs  12/09/15 0445  WBC 4.3  HGB 8.4*  HCT 26.0*  PLT 176   BMET  Recent Labs  12/09/15 0615  NA 130*  K 3.7  CL 99*  CO2 16*  GLUCOSE 78  BUN 13  CREATININE 0.88  CALCIUM 8.2*   LFT  Recent Labs  12/09/15 0615  PROT 6.3*  ALBUMIN 3.2*  AST 62*  ALT 37  ALKPHOS 84  BILITOT 1.3*   PT/INR No results for input(s): LABPROT, INR in  the last 72 hours.  STUDIES: Ct Abdomen Pelvis W Contrast  Result Date:  12/09/2015 CLINICAL DATA:  Diffuse abdominal pain, nausea and vomiting for the past 2 weeks. EXAM: CT ABDOMEN AND PELVIS WITH CONTRAST TECHNIQUE: Multidetector CT imaging of the abdomen and pelvis was performed using the standard protocol following bolus administration of intravenous contrast. CONTRAST:  75 ISOVUE-300 IOPAMIDOL (ISOVUE-300) INJECTION 61% COMPARISON:  CT abdomen and pelvis - 03/04/2014; 08/10/2013 ; abdominal radiograph - earlier same day FINDINGS: Lower chest: Evaluation of lower thorax is degraded secondary to patient respiratory artifact. Minimal dependent subpleural ground-glass atelectasis within image right lower lobe. No focal airspace opacities. No pleural effusion. Hepatobiliary: Normal hepatic contour. No discrete hepatic lesions. Common bile duct is very mildly dilated measuring 7 mm in diameter with associated mild intrahepatic biliary duct dilatation, presumably secondary to biliary reservoir phenomenon secondary to post cholecystectomy state. No ascites. Pancreas: Normal appearance of the pancreas Spleen: Normal appearance of the spleen. Note is made of a small splenule. Adrenals/Urinary Tract: There is symmetric enhancement and excretion of the bilateral kidneys. No definite renal stones. No discrete renal lesions. No urine obstruction or perinephric stranding. Normal appearance of the bilateral adrenal glands. Normal appearance of the urinary bladder given degree distention. Stomach/Bowel: Scattered colonic diverticulosis without evidence of diverticulitis. There is mild rather diffuse circumferential colonic wall thickening involving the ascending (coronal image 49, series 5) and proximal transverse (images 39, series 2, coronal images 25 and 30, series 5), not resulting in enteric obstruction. Normal appearance of the terminal ileum and retrocecal appendix. No pneumoperitoneum, pneumatosis or portal venous gas. Vascular/Lymphatic: Moderate amount of mixed calcified and noncalcified  atherosclerotic plaque within a normal caliber abdominal aorta, not resulting in a hemodynamically significant stenosis. No abdominal aortic dissection or periaortic stranding. The branch vessels of the abdominal aorta appear patent on this non CTA examination. No bulky retroperitoneal, mesenteric, pelvic or inguinal lymphadenopathy. Reproductive: Post hysterectomy. No discrete adnexal lesion. No free fluid in the pelvic cul-de-sac. Other: Regional soft tissues appear normal. Musculoskeletal: No acute or aggressive osseous abnormalities. IMPRESSION: 1. Mild diffuse circumferential colonic wall thickening involving the ascending and proximal transverse colon, potentially accentuated due to underdistention though a colitis could have a similar appearance. Clinical correlation is advised. No evidence of enteric obstruction. 2. Colonic diverticulosis without evidence of diverticulitis. 3.  Aortic Atherosclerosis (ICD10-170.0) Electronically Signed   By: Sandi Mariscal M.D.   On: 12/09/2015 09:02   Dg Abd Portable 1v  Result Date: 12/09/2015 CLINICAL DATA:  Abdominal pain today mostly left-sided as symptoms have been ongoing for 1 year. 55 pound weight loss. Nausea and vomiting. EXAM: PORTABLE ABDOMEN - 1 VIEW COMPARISON:  11/14/2014 and CT 03/04/2014 FINDINGS: Surgical clips over the right upper quadrant. Bowel gas pattern is nonobstructive. Haustral contour of the descending colon may be due to mild submucosal edema. No evidence of mass or mass effect. No abnormal calcifications. Mild degenerate change of the spine. Single surgical clip over the left lower quadrant. IMPRESSION: Nonobstructive bowel gas pattern. Haustral contour of the descending colon suggestion of mild submucosal edema. Given patient's symptoms, consider CT for further evaluation. Electronically Signed   By: Marin Olp M.D.   On: 12/09/2015 08:08     PREVIOUS ENDOSCOPIES:            See history of present illness, all performed at [facility]    Impression / Plan:   Impression: 1. Symptomatic Anemia: Patient's hemoglobin has decreased from 11.2 in July to 8.4 currently, iron  and vitamin B12 are normal, recent episode of hematemesis reported, last EGD and colonoscopy 12/03/13 [facility], see report above 2. Abdominal pain: Chronic for the patient, see extensive chart review notes an history of present illness, thought to be functional in nature by [facility] 3. Hematemesis: Patient reports 2 weeks of recurrent emesis and recent episodes of hematemesis; likely mallory weiss as occurred after weeks of vomiting 4. Rheumatoid arthritis: Not on DMARD's or biologic agents or prednisone 5. Unintentional weight loss: Based on chart review patient's weight has fluctuated between 160 10/14/1973 between June and early September 2016 and has essentially remained the same between 166 and 167 from October to June 2017, weight on 10/27 was noted to be 151 6. Transaminitis: Chronic and stable 7. Hypothyroidism: TSH found to be elevated, question relation to chronic hoarseness and GI symptoms 8. Chronic hoarseness: See above  Plan: 1. Patient will need EGD for further evaluation of recent episodes of hematemesis as well as anemia, discussed risks, benefits, limitations and alternatives this procedure and the patient agrees to proceed. This was scheduled for this afternoon with Dr. Loletha Carrow. 2. NPO until after time of procedure today 3. Continue to monitor hemoglobin with transfusion for hemoglobin less than 7 4. Continue antiemetics 5. Will discuss above with Dr. Loletha Carrow, please await any further recommendations  Thank you for your kind consultation, we will continue to follow.  Lavone Nian Lemmon  12/09/2015, 11:00 AM Pager #: [phone]  I have reviewed the entire case in detail with the above APP and discussed the plan in detail.  Therefore, I agree with the diagnoses recorded above. In addition,  I have personally interviewed and examined the patient and  have personally reviewed any abdominal/pelvic CT scan images.  My additional thoughts are as follows:  The hematemesis is most likely due to a MWT from retching.  Her chronic abdominal pain and gastroparesis appear to have at least something to so with chronic pain syndrome and use of narcotic analgesics.  I offered her an EGD to investigate bleeding, and she is agreeable.  She understands is it not likely to be revealing for her other symptoms, as they are chronic and have been worked up by previous GI physicians at an academic institution.    Nelida Meuse III Pager [phone]  Mon-Fri 8a-5p [phone] after 5p, weekends, holidays

## 2015-12-09 NOTE — ED Triage Notes (Signed)
Pt. reports multiple bloody emesis yesterday with  generalized abdominal pain , denies diarrhea , no fever or chills.

## 2015-12-09 NOTE — ED Notes (Signed)
Femoral stick performed by Dr. Claudine Mouton for blood work. Witnessed rectal exam by Danford PA-C. Plan of care discussed with patient, no orders for pain medicine at this time.

## 2015-12-09 NOTE — H&P (View-Only) (Signed)
Consultation  Referring Provider:  Dr. Aggie Moats    Primary Care Physician:  [name], MD Primary Gastroenterologist:    Althia Forts     Reason for Consultation:  Symptomatic anemia            HPI:   Adrienne Flores is a 50 y.o. female with a past medical history significant for degenerative disc disease, depression, diverticulitis, fibromyalgia, gastroparesis, hemorrhoids, hyperthyroidism, lupus, rheumatoid arthritis and ulcerative colitis, who presented to the ED on 12/09/2015 with complaint of 2 weeks of recurrent emesis in the setting of chronic abdominal pain. Recently the patient has had diagnosis of progressive anemia and unexplained weight loss along with chronic hoarseness, hypothyroidism and recent infectious colitis treated in June 2017.    In ED, patient reported worsening abdominal pain and decrease in weight loss sating in the past 2 weeks she has decreased her weight from 160 to do 143 pounds. She did report emesis which was primarily food products in the past 24 hours as well as several episodes of bright red to bloody red hematemesis. There was no hematemesis or emesis while in the ER. Since arrival to the ER the patient complained of significant pain and was requesting Dilaudid. She was given fentanyl for 1 dose patient was found have significantly worsened anemia with hemoglobin of 8.4 down from around 11 in July and was found to be hyponatremic with a sodium of 130 with normal renal function.    At time of my interview this morning, the patient tells me that she has been losing her appetite for the past few months and has been noticing a decrease in weight during this time, see above. She explains that this is mostly due to the fact that when she would eat her lower abdominal pain would worsen within 10-15 minutes which made her "scared to eat". This is different from her chronic abdominal pain which she has been experiencing for years now. Over the past week and a half to  2 the patient notes that she has also developed vomiting when she "puts anything in my mouth", she has been sucking on ice chips over this time period and even sometimes when the water gets to her stomach she will vomit it up. She has had too many episodes of vomiting to count over this two-week time span and on Wednesday of this week, 12/07/15 noted some bright red blood in her vomit for the first time. She tells me that this looked like "quite a lot of blood". She notes that since that time she was vomiting every 20 minutes with some bright red blood in this. She has not vomited since in the hospital, though is on anti-nausea medications. She does continue with some nausea and is currently only sucking on ice chips. Patient tells me she also has report from her husband that there was some bright red blood mixed in with dark "black" stool about a week and a half ago when she was going to the bathroom "becuase I don't look at my stool, but he did", but since then she has had normal daily bowel movements which are solid with no blood.  Patient denies fever, chills, dysphagia, heartburn, reflux, sick contacts, new medications, changes in diet, anticoagulation or use of NSAIDs.  ED Course:  Vital Signs: BP 114/77   Pulse 80   Temp 97.9 F (36.6 C) (Axillary)   Resp 14   SpO2 100%  Lab data: Sodium 1:30, potassium 3.7, chloride 99, CO2  16, BUN 13, creatinine 0.88, anion gap 15, alkaline phosphatase 84, albumin 3.2, lipase 26, AST 62, ALT 37, total protein 6.3, total bilirubin 1.3, white count 4300 with normal differential, hemoglobin 8.4, platelets 176,000 Medications and treatments: Normal saline bolus 1 L 1, Zofran 4 mg IV 1, fentanyl 100 g IV 1, Reglan 10 mg IV 1 lverlying the left hemipelvis. No acute osseous abnormality. Previous GI workup: 12/06/15-abdominal x-ray at [facility]: Small by basilar opacities, likely atelectasis. Cholecystectomy clips. Mildly prominent loops of air-filled small bowel  overlying the mid abdomen and nonobstructive pattern. Distal bowel gas is noted. Mild colonic stool burden. No abnormal soft tissue calcifications. Clip overlying the left hemipelvis. No acute osseous abnormality. 06/05/15-CT abdomen and pelvis with contrast: Impression: Mild wall thickening and pericolonic inflammatory stranding throughout the large bowel, most pronounced in the cecum and ascending colon, these findings are consistent with mild colitis. (Looking back it appears patient has had 5 CTs of her abdomen since January 2016 on questioning this mild wall thickening, normal CT 08/20/13, began being abnormal with bowel wall thickening on 03/07/14) 04/30/14-office visit at [facility] with Dr. 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 In particular patient had features suggesting functional dyspepsia as well as irritable bowel syndrome with diarrhea predominance. The patient did not have symptoms or features to suggest inflammatory bowel disease or eosinophilic gastroenteritis. Is also thought her history is not entirely consistent with cyclic vomiting syndrome. Patient was sent referral to the GI psychiatrist. 12/03/13-colonoscopy, Dr. Kennith Gain at [facility] health care: Impression: Internal hemorrhoids, diverticulosis in the sigmoid colon, exam was otherwise normal; pathology: Unremarkable colonic mucosa, no dysplasia or inflammatory process identified, no evidence for microscopic colitis 12/03/13-upper endoscopy, Dr. Kennith Gain at [facility] care: Impression: Normal esophagus, normal stomach, normal examined duodenum; pathology: No significant pathologic abnormality, no villous abnormalities in the duodenum 06/26/13-consult by [name] during hospital admission: At that time patient described 8 years of diffuse sharp abdominal pain that would worsen  after eating with frequent urgency postprandially with nonbloody diarrhea that would follow. Apparently she saw Dr. Marene Lenz in January 2014 at [place] and a colonoscopy was done that month. The report is not in Epic but biopsies were negative for inflammation or microscopic colitis. She also no showed for an appointment to [facility] previously. Chart history from [facility] shows possible cyclical vomiting syndrome. Plan: At that time was discussed previous workup at [facility] in 2014 was negative. It was discussed that if she had lupus that could cause some GI symptoms. 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 Further evaluation with colonoscopy and EGD were not recommended at that time. Porphyria tests were pending.  Past Medical History:  Diagnosis Date  . DDD (degenerative disc disease), lumbar   . Degenerative disc disease   . Depression   . Diverticulitis   . Fibromyalgia   . Fibromyalgia   . Fibromyalgia   . Gastroparesis   . H/O degenerative disc disease   . Hemorrhoids   . History of chicken pox   . Hyperthyroidism   . Hypokalemia   . Lupus   . 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 Psychogenic disorder   . Rheumatoid arthritis ([place])   . Rheumatoid arthritis ([place])   . Thyroid disease   . Ulcerative colitis ([place])  Past Surgical History:  Procedure Laterality Date  . ABDOMINAL HYSTERECTOMY    . CESAREAN SECTION     x2  . CHOLECYSTECTOMY    . KNEE SURGERY Left   . OOPHORECTOMY    . REPLACEMENT TOTAL KNEE Bilateral     Family History  Problem Relation Age of Onset  . Leukemia Mother   . Arthritis Mother   . Early death Mother   . Miscarriages / [place] Mother    Social History  Substance Use Topics  . Smoking status: Never Smoker  . Smokeless tobacco: Never Used  . Alcohol use Yes     Comment: social    Prior to Admission medications   Medication Sig Start Date End Date Taking? Authorizing Provider    amitriptyline (ELAVIL) 75 MG tablet Take 75 mg by mouth at bedtime.   Yes Historical Provider, MD  dicyclomine (BENTYL) 20 MG tablet Take 1 tablet (20 mg total) by mouth 2 (two) times daily. 10/22/14  Yes Gareth Morgan, MD  gabapentin (NEURONTIN) 600 MG tablet Take 600 mg by mouth 3 (three) times daily.   Yes Historical Provider, MD  levothyroxine (SYNTHROID, LEVOTHROID) 25 MCG tablet Take 25 mcg by mouth daily before breakfast. Take with 373mcg to = 342mcg total   Yes Historical Provider, MD  levothyroxine (SYNTHROID, LEVOTHROID) 300 MCG tablet Take 300 mcg by mouth daily before breakfast. Take with a 68mcg tablet to = 372mcg   Yes Historical Provider, MD  meclizine (ANTIVERT) 25 MG tablet Take 1 tablet (25 mg total) by mouth 3 (three) times daily as needed. Patient taking differently: Take 25 mg by mouth 3 (three) times daily as needed for dizziness or nausea.  09/11/13  Yes Christopher Lawyer, PA-C  metoCLOPramide (REGLAN) 10 MG tablet Take 1 tablet (10 mg total) by mouth 4 (four) times daily. 05/03/13  Yes Carlisle Cater, PA-C  ondansetron (ZOFRAN ODT) 4 MG disintegrating tablet Take 1 tablet (4 mg total) by mouth every 8 (eight) hours as needed for nausea or vomiting. 10/22/14  Yes Gareth Morgan, MD  oxyCODONE-acetaminophen (PERCOCET/ROXICET) 5-325 MG tablet Take 1 tablet by mouth every 6 (six) hours as needed for pain. 12/02/15  Yes Historical Provider, MD  potassium chloride SA (K-DUR,KLOR-CON) 20 MEQ tablet Take 20 mEq by mouth daily. 11/18/15 11/17/16 Yes Historical Provider, MD    Current Facility-Administered Medications  Medication Dose Route Frequency Provider Last Rate Last Dose  . 0.9 %  sodium chloride infusion   Intravenous Continuous Samella Parr, NP      . acetaminophen (TYLENOL) tablet 650 mg  650 mg Oral Q6H PRN Samella Parr, NP       Or  . acetaminophen (TYLENOL) suppository 650 mg  650 mg Rectal Q6H PRN Samella Parr, NP      . diphenhydrAMINE (BENADRYL) injection 25 mg   25 mg Intravenous Q6H PRN Samella Parr, NP      . famotidine (PEPCID) IVPB 20 mg premix  20 mg Intravenous Q12H Samella Parr, NP   20 mg at 12/09/15 1015  . gabapentin (NEURONTIN) tablet 600 mg  600 mg Oral TID Samella Parr, NP   600 mg at 12/09/15 0925  . levothyroxine (SYNTHROID, LEVOTHROID) tablet 25 mcg  25 mcg Oral QAC breakfast Samella Parr, NP   25 mcg at 12/09/15 0926  . levothyroxine (SYNTHROID, LEVOTHROID) tablet 300 mcg  300 mcg Oral QAC breakfast Samella Parr, NP   300 mcg at 12/09/15 0925  . metoCLOPramide (  REGLAN) injection 10 mg  10 mg Intravenous Q6H Samella Parr, NP   10 mg at 12/09/15 1016  . morphine 4 MG/ML injection 1-4 mg  1-4 mg Intravenous Q2H PRN Samella Parr, NP   4 mg at 12/09/15 [id number]  . ondansetron (ZOFRAN) tablet 4 mg  4 mg Oral Q6H PRN Samella Parr, NP       Or  . ondansetron [facility]) injection 4 mg  4 mg Intravenous Q6H PRN Samella Parr, NP   4 mg at 12/09/15 0926  . sodium chloride flush (NS) 0.9 % injection 10-40 mL  10-40 mL Intracatheter PRN Everlene Balls, MD        Allergies as of 12/09/2015 - Review Complete 12/09/2015  Allergen Reaction Noted  . Methotrexate derivatives Other (See Comments) 11/10/2012  . Morphine and related Hives and Itching 12/02/2012  . Relafen [nabumetone] Other (See Comments) 01/05/2013     Review of Systems:     Constitutional: Positive for weight loss and weakness No fever, chills or fatigue HEENT: Eyes: No change in vision               Ears, Nose, Throat:  No change in hearing or congestion Skin: No rash or itching Cardiovascular: No chest pain, chest pressure or palpitations   Respiratory: No SOB or cough Gastrointestinal: See HPI and otherwise negative Genitourinary: No dysuria or change in urinary frequency Neurological: No headache, dizziness or syncope Musculoskeletal: No new muscle or joint pain Hematologic: No bruising Psychiatric: Positive history of depression, No anxiety    Physical  Exam:  Vital signs in last 24 hours: Temp:  [97.9 F (36.6 C)-98.4 F (36.9 C)] 98.4 F (36.9 C) (10/27 0919) Pulse Rate:  [77-105] 77 (10/27 0919) Resp:  [10-22] 16 (10/27 0919) BP: (101-125)/(66-93) 105/70 (10/27 0919) SpO2:  [100 %] 100 % (10/27 0919) Weight:  [151 lb 0.2 oz (68.5 kg)] 151 lb 0.2 oz (68.5 kg) (10/27 0919)   General: Caucasian female appears to be in mild distress, Well developed, Well nourished, alert and cooperative Head:  Normocephalic and atraumatic. Eyes:   PEERL, EOMI. No icterus. Conjunctiva pink. Ears:  Normal auditory acuity. Neck:  Supple Throat: Oral cavity and pharynx without inflammation, swelling or lesion. Malodorous breath Lungs: Respirations even and unlabored. Lungs clear to auscultation bilaterally.   No wheezes, crackles, or rhonchi. Port-A-Cath in place right upper chest Heart: Normal S1, S2. No MRG. Regular rate and rhythm. No peripheral edema, cyanosis or pallor.  Abdomen:  Soft, nondistended, nontender. No rebound or guarding. Normal bowel sounds. No appreciable masses or hepatomegaly. Rectal:  Not performed.  Msk:  Symmetrical without gross deformities. Peripheral pulses intact.  Extremities:  Without edema, no deformity or joint abnormality. Normal ROM, normal sensation. Neurologic:  Alert and  oriented x4;  grossly normal neurologically. CN II-XII intact.  Skin:   Dry and intact without significant lesions or rashes. Psychiatric: Oriented to person, place and time. Anxious mood.   LAB RESULTS:  Recent Labs  12/09/15 0445  WBC 4.3  HGB 8.4*  HCT 26.0*  PLT 176   BMET  Recent Labs  12/09/15 0615  NA 130*  K 3.7  CL 99*  CO2 16*  GLUCOSE 78  BUN 13  CREATININE 0.88  CALCIUM 8.2*   LFT  Recent Labs  12/09/15 0615  PROT 6.3*  ALBUMIN 3.2*  AST 62*  ALT 37  ALKPHOS 84  BILITOT 1.3*   PT/INR No results for input(s): LABPROT, INR in  the last 72 hours.  STUDIES: Ct Abdomen Pelvis W Contrast  Result Date:  12/09/2015 CLINICAL DATA:  Diffuse abdominal pain, nausea and vomiting for the past 2 weeks. EXAM: CT ABDOMEN AND PELVIS WITH CONTRAST TECHNIQUE: Multidetector CT imaging of the abdomen and pelvis was performed using the standard protocol following bolus administration of intravenous contrast. CONTRAST:  75 ISOVUE-300 IOPAMIDOL (ISOVUE-300) INJECTION 61% COMPARISON:  CT abdomen and pelvis - 03/04/2014; 08/10/2013 ; abdominal radiograph - earlier same day FINDINGS: Lower chest: Evaluation of lower thorax is degraded secondary to patient respiratory artifact. Minimal dependent subpleural ground-glass atelectasis within image right lower lobe. No focal airspace opacities. No pleural effusion. Hepatobiliary: Normal hepatic contour. No discrete hepatic lesions. Common bile duct is very mildly dilated measuring 7 mm in diameter with associated mild intrahepatic biliary duct dilatation, presumably secondary to biliary reservoir phenomenon secondary to post cholecystectomy state. No ascites. Pancreas: Normal appearance of the pancreas Spleen: Normal appearance of the spleen. Note is made of a small splenule. Adrenals/Urinary Tract: There is symmetric enhancement and excretion of the bilateral kidneys. No definite renal stones. No discrete renal lesions. No urine obstruction or perinephric stranding. Normal appearance of the bilateral adrenal glands. Normal appearance of the urinary bladder given degree distention. Stomach/Bowel: Scattered colonic diverticulosis without evidence of diverticulitis. There is mild rather diffuse circumferential colonic wall thickening involving the ascending (coronal image 49, series 5) and proximal transverse (images 39, series 2, coronal images 25 and 30, series 5), not resulting in enteric obstruction. Normal appearance of the terminal ileum and retrocecal appendix. No pneumoperitoneum, pneumatosis or portal venous gas. Vascular/Lymphatic: Moderate amount of mixed calcified and noncalcified  atherosclerotic plaque within a normal caliber abdominal aorta, not resulting in a hemodynamically significant stenosis. No abdominal aortic dissection or periaortic stranding. The branch vessels of the abdominal aorta appear patent on this non CTA examination. No bulky retroperitoneal, mesenteric, pelvic or inguinal lymphadenopathy. Reproductive: Post hysterectomy. No discrete adnexal lesion. No free fluid in the pelvic cul-de-sac. Other: Regional soft tissues appear normal. Musculoskeletal: No acute or aggressive osseous abnormalities. IMPRESSION: 1. Mild diffuse circumferential colonic wall thickening involving the ascending and proximal transverse colon, potentially accentuated due to underdistention though a colitis could have a similar appearance. Clinical correlation is advised. No evidence of enteric obstruction. 2. Colonic diverticulosis without evidence of diverticulitis. 3.  Aortic Atherosclerosis (ICD10-170.0) Electronically Signed   By: Sandi Mariscal M.D.   On: 12/09/2015 09:02   Dg Abd Portable 1v  Result Date: 12/09/2015 CLINICAL DATA:  Abdominal pain today mostly left-sided as symptoms have been ongoing for 1 year. 55 pound weight loss. Nausea and vomiting. EXAM: PORTABLE ABDOMEN - 1 VIEW COMPARISON:  11/14/2014 and CT 03/04/2014 FINDINGS: Surgical clips over the right upper quadrant. Bowel gas pattern is nonobstructive. Haustral contour of the descending colon may be due to mild submucosal edema. No evidence of mass or mass effect. No abnormal calcifications. Mild degenerate change of the spine. Single surgical clip over the left lower quadrant. IMPRESSION: Nonobstructive bowel gas pattern. Haustral contour of the descending colon suggestion of mild submucosal edema. Given patient's symptoms, consider CT for further evaluation. Electronically Signed   By: Marin Olp M.D.   On: 12/09/2015 08:08     PREVIOUS ENDOSCOPIES:            See history of present illness, all performed at [facility]    Impression / Plan:   Impression: 1. Symptomatic Anemia: Patient's hemoglobin has decreased from 11.2 in July to 8.4 currently, iron  and vitamin B12 are normal, recent episode of hematemesis reported, last EGD and colonoscopy 12/03/13 it [facility], see report above 2. Abdominal pain: Chronic for the patient, see extensive chart review notes an history of present illness, thought to be functional in nature by [facility] 3. Hematemesis: Patient reports 2 weeks of recurrent emesis and recent episodes of hematemesis; likely mallory weiss as occurred after weeks of vomiting 4. Rheumatoid arthritis: Not on DMARD's or biologic agents or prednisone 5. Unintentional weight loss: Based on chart review patient's weight has fluctuated between 160 10/14/1973 between June and early September 2016 and has essentially remained the same between 166 and 167 from October to June 2017, weight on 10/27 was noted to be 151 6. Transaminitis: Chronic and stable 7. Hypothyroidism: TSH found to be elevated, question relation to chronic hoarseness and GI symptoms 8. Chronic hoarseness: See above  Plan: 1. Patient will need EGD for further evaluation of recent episodes of hematemesis as well as anemia, discussed risks, benefits, limitations and alternatives this procedure and the patient agrees to proceed. This was scheduled for this afternoon with Dr. Loletha Carrow. 2. NPO until after time of procedure today 3. Continue to monitor hemoglobin with transfusion for hemoglobin less than 7 4. Continue antiemetics 5. Will discuss above with Dr. Loletha Carrow, please await any further recommendations  Thank you for your kind consultation, we will continue to follow.  Adrienne Flores  12/09/2015, 11:00 AM Pager #: [phone]  I have reviewed the entire case in detail with the above APP and discussed the plan in detail.  Therefore, I agree with the diagnoses recorded above. In addition,  I have personally interviewed and examined the patient and  have personally reviewed any abdominal/pelvic CT scan images.  My additional thoughts are as follows:  The hematemesis is most likely due to a MWT from retching.  Her chronic abdominal pain and gastroparesis appear to have at least something to so with chronic pain syndrome and use of narcotic analgesics.  I offered her an EGD to investigate bleeding, and she is agreeable.  She understands is it not likely to be revealing for her other symptoms, as they are chronic and have been worked up by previous GI physicians at an academic institution.    Adrienne Flores Pager [phone]  Mon-Fri 8a-5p [phone] after 5p, weekends, holidays

## 2015-12-09 NOTE — ED Notes (Signed)
Pt asking for PO percocet shortly after being updated on plan of care for no further narcotics in ED. Informed by nurse that she needs to remain NPO until all the blood work comes back.

## 2015-12-09 NOTE — ED Notes (Signed)
Report called  

## 2015-12-09 NOTE — Progress Notes (Addendum)
Found prescription medication, Percocet, in pt's room in the drawer.   Educated and informed pt that she could not have it in the room per protocol. Informed pt that the medication would have to be collected by staff member and sent to pharmacy.   Pt stated her husband would stop by later to pick them up.  Husband arrived on the unit around 1900. Educated and informed husband of medications from home policy. Pt's husband agreed to remove Percocet medication from room and take home.   Also informed the oncoming night nurse, Caryl Pina about Percocet medication in pt's room and was present when pt and husband were educated about medications from home, protocol.  The pt and husband demonstrated understanding.  Will continue to monitor.  Paulla Fore, RN

## 2015-12-09 NOTE — ED Notes (Signed)
Pt requesting more fentanyl or dilaudid. Continues to c/o pain. PA-C notified.

## 2015-12-09 NOTE — Transfer of Care (Signed)
Immediate Anesthesia Transfer of Care Note  Patient: Adrienne Flores  Procedure(s) Performed: Procedure(s): ESOPHAGOGASTRODUODENOSCOPY (EGD) (N/A)  Patient Location: PACU  Anesthesia Type:MAC  Level of Consciousness: awake, alert , oriented and patient cooperative  Airway & Oxygen Therapy: Patient Spontanous Breathing and Patient connected to nasal cannula oxygen  Post-op Assessment: Report given to RN and Post -op Vital signs reviewed and stable  Post vital signs: Reviewed and stable  Last Vitals:  Vitals:   12/09/15 1449 12/09/15 1558  BP: (!) 145/103 (!) 140/96  Pulse: 97 79  Resp: 18 10  Temp:      Last Pain:  Vitals:   12/09/15 1558  TempSrc: Oral  PainSc: 8       Patients Stated Pain Goal: 4 ([id number])  Complications: No apparent anesthesia complications

## 2015-12-09 NOTE — ED Notes (Signed)
Pt continuing to ask for diluadid. PA-C notified, no new orders at this time.

## 2015-12-09 NOTE — ED Notes (Signed)
IV team at bedside 

## 2015-12-09 NOTE — ED Provider Notes (Signed)
[place] DEPT Provider Note   CSN: [id number] Arrival date & time: 12/09/15  0217     History   Chief Complaint Chief Complaint  Patient presents with  . Hematemesis    HPI Adrienne Flores is a 50 y.o. female.  Adrienne Flores is a 50 y.o. Female with a history of chronic abdominal pain with multiple ER visits at multiple different ERs across multiple different systems who presents to the ED complaining of hematemesis beginning today. She also complains of her chronic abdominal pain that is generalized her abdomen. She claims of nausea and vomiting. She reports "several" episodes of hematemesis today. She reports bright red blood in her vomit. No diarrhea or hematochezia. No treatments prior to arrival today. On chart review patient was just discharged from [facility] emergency department with nausea and vomiting 2 days ago. She was not taking her synthroid and was requesting pain medications. She was told her pain medacations would be dependent on if she is compliant with her other medications. Previous Pseudomonas surgical history includes a cholecystectomy and hysterectomy. Last vomiting was yesterday. Patient denies fevers, diarrhea, hematochezia, urinary symptoms, rashes, alcohol use, syncope.    The history is provided by the patient and medical records. No language interpreter was used.    Past Medical History:  Diagnosis Date  . DDD (degenerative disc disease), lumbar   . Degenerative disc disease   . Depression   . Diverticulitis   . Fibromyalgia   . Fibromyalgia   . Fibromyalgia   . Gastroparesis   . H/O degenerative disc disease   . Hemorrhoids   . History of chicken pox   . Hyperthyroidism   . Hypokalemia   . Lupus   . 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 Psychogenic disorder   . Rheumatoid arthritis ([place])   . Rheumatoid arthritis ([place])   . Thyroid disease   . Ulcerative colitis [facility])     Patient Active Problem List   Diagnosis Date Noted  . 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 DDD (degenerative disc disease), cervical 06/16/2014  . 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 DDD (degenerative disc disease), lumbosacral 06/16/2014  . Chronic abdominal pain 07/26/2013  . Nausea and vomiting 07/26/2013  . 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 Rheumatoid arthritis ([place]) 07/26/2013  . UTI (lower urinary tract infection) 07/26/2013  . Uncontrolled pain 07/26/2013  . Acute on chronic renal failure ([place]) 06/27/2013  . Colitis, infectious 06/25/2013  . AKI (acute kidney injury) ([place]) 06/25/2013  . Transaminitis 06/25/2013  . Infectious colitis 06/25/2013  . Abdominal pain 11/26/2012  . Unspecified hypothyroidism 11/26/2012    Past Surgical History:  Procedure Laterality Date  . ABDOMINAL HYSTERECTOMY    . CESAREAN SECTION     x2  . CHOLECYSTECTOMY    . KNEE SURGERY Left   . OOPHORECTOMY    . REPLACEMENT TOTAL KNEE Bilateral     OB History    No data available       Home Medications    Prior to Admission medications   Medication Sig Start Date End Date Taking? Authorizing Provider  amitriptyline (ELAVIL) 75 MG tablet Take 75 mg by mouth at bedtime.    Historical Provider, MD  dicyclomine (BENTYL) 20 MG tablet Take 1 tablet (20 mg total) by mouth 2 (two) times daily. 10/22/14   Gareth Morgan, MD  gabapentin (NEURONTIN) 600 MG tablet Take 600 mg by mouth 3 (three) times daily.  Historical Provider, MD  levothyroxine (SYNTHROID, LEVOTHROID) 25 MCG tablet Take 25 mcg by mouth daily before breakfast. Take with 38mcg to = 317mcg total    Historical Provider, MD  levothyroxine (SYNTHROID, LEVOTHROID) 300 MCG tablet Take 300 mcg by mouth daily before breakfast. Take with a 34mcg tablet to = 365mcg    Historical Provider, MD  meclizine (ANTIVERT) 25 MG tablet Take 1 tablet (25 mg total) by mouth 3 (three) times daily as needed. Patient taking differently: Take 25 mg by mouth 3 (three)  times daily as needed for dizziness or nausea.  09/11/13   Dalia Heading, PA-C  metoCLOPramide (REGLAN) 10 MG tablet Take 1 tablet (10 mg total) by mouth 4 (four) times daily. 05/03/13   Carlisle Cater, PA-C  ondansetron (ZOFRAN ODT) 4 MG disintegrating tablet Take 1 tablet (4 mg total) by mouth every 8 (eight) hours as needed for nausea or vomiting. 10/22/14   Gareth Morgan, MD  traMADol (ULTRAM) 50 MG tablet Take 1 tablet (50 mg total) by mouth every 6 (six) hours as needed. 04/17/14   Comer Locket, PA-C    Family History Family History  Problem Relation Age of Onset  . Leukemia Mother   . Arthritis Mother   . Early death Mother   . Miscarriages / [place] Mother     Social History Social History  Substance Use Topics  . Smoking status: Never Smoker  . Smokeless tobacco: Never Used  . Alcohol use Yes     Comment: social     Allergies   Methotrexate derivatives; Morphine and related; and Relafen [nabumetone]   Review of Systems Review of Systems  Constitutional: Negative for chills and fever.  HENT: Negative for congestion and sore throat.   Eyes: Negative for visual disturbance.  Respiratory: Negative for cough and shortness of breath.   Cardiovascular: Negative for chest pain.  Gastrointestinal: Positive for abdominal pain, blood in stool, nausea and vomiting. Negative for diarrhea.  Genitourinary: Negative for difficulty urinating, dysuria, frequency and hematuria.  Musculoskeletal: Negative for back pain.  Skin: Negative for rash.  Neurological: Negative for syncope and headaches.     Physical Exam Updated Vital Signs BP 114/77   Pulse 80   Temp 97.9 F (36.6 C) (Axillary)   Resp 14   SpO2 100%   Physical Exam  Constitutional: She is oriented to person, place, and time. She appears well-developed and well-nourished. No distress.  Nontoxic appearing.  HENT:  Head: Normocephalic and atraumatic.  Mouth/Throat: Oropharynx is clear and moist.  Mucous  members are moist. No blood in her oropharynx.  Eyes: Conjunctivae are normal. Pupils are equal, round, and reactive to light. Right eye exhibits no discharge. Left eye exhibits no discharge.  Neck: Neck supple.  Cardiovascular: Normal rate, regular rhythm, normal heart sounds and intact distal pulses.  Exam reveals no gallop and no friction rub.   No murmur heard. Heart rate is 88.  Pulmonary/Chest: Effort normal and breath sounds normal. No respiratory distress. She has no wheezes. She has no rales.  Lungs are clear to auscultation bilaterally. Port in left chest.   Abdominal: Soft. Bowel sounds are normal. She exhibits no distension and no mass. There is tenderness. There is no rebound and no guarding.  Abdomen is soft. Bowel sounds present. Patient has generalized abdominal tenderness to palpation without focal tenderness. No peritoneal signs.  Genitourinary: Rectal exam shows guaiac negative stool.  Genitourinary Comments: On digital rectal exam no gross bloody stool. Anal sphincter tone is normal.  Musculoskeletal: She exhibits no edema.  Lymphadenopathy:    She has no cervical adenopathy.  Neurological: She is alert and oriented to person, place, and time. Coordination normal.  Skin: Skin is warm and dry. Capillary refill takes less than 2 seconds. No rash noted. She is not diaphoretic. No erythema. No pallor.  Psychiatric: She has a normal mood and affect. Her behavior is normal.  Nursing note and vitals reviewed.    ED Treatments / Results  Labs (all labs ordered are listed, but only abnormal results are displayed) Labs Reviewed  COMPREHENSIVE METABOLIC PANEL - Abnormal; Notable for the following:       Result Value   Sodium 130 (*)    Chloride 99 (*)    CO2 16 (*)    Calcium 8.2 (*)    Total Protein 6.3 (*)    Albumin 3.2 (*)    AST 62 (*)    Total Bilirubin 1.3 (*)    All other components within normal limits  CBC WITH DIFFERENTIAL/PLATELET - Abnormal; Notable for the  following:    RBC 2.81 (*)    Hemoglobin 8.4 (*)    HCT 26.0 (*)    RDW 16.8 (*)    All other components within normal limits  LIPASE, BLOOD  CBC WITH DIFFERENTIAL/PLATELET  URINALYSIS, ROUTINE W REFLEX MICROSCOPIC (NOT AT [facility])  POC OCCULT BLOOD, ED    EKG  EKG Interpretation None       Radiology No results found.  Procedures Procedures (including critical care time)  Medications Ordered in ED Medications  sodium chloride flush (NS) 0.9 % injection 10-40 mL (not administered)  sodium chloride 0.9 % bolus 1,000 mL (0 mLs Intravenous Stopped 12/09/15 0426)  ondansetron (ZOFRAN) injection 4 mg (4 mg Intravenous Given 12/09/15 0318)  fentaNYL (SUBLIMAZE) injection 100 mcg (100 mcg Intravenous Given 12/09/15 0313)  alteplase (CATHFLO ACTIVASE) injection 2 mg (2 mg Intracatheter Given 12/09/15 0508)  metoCLOPramide (REGLAN) injection 10 mg (10 mg Intravenous Given 12/09/15 0359)  diphenhydrAMINE (BENADRYL) injection 25 mg (25 mg Intravenous Given 12/09/15 0359)     Initial Impression / Assessment and Plan / ED Course  I have reviewed the triage vital signs and the nursing notes.  Pertinent labs & imaging results that were available during my care of the patient were reviewed by me and considered in my medical decision making (see chart for details).  Clinical Course   This is a 50 y.o. Female with a history of chronic abdominal pain with multiple ER visits at multiple different ERs across multiple different systems who presents to the ED complaining of hematemesis beginning today. She also complains of her chronic abdominal pain that is generalized her abdomen. She claims of nausea and vomiting. She reports "several" episodes of hematemesis today. She reports bright red blood in her vomit. No diarrhea or hematochezia. No treatments prior to arrival today. On chart review patient was just discharged from [facility] emergency department with nausea and vomiting 2 days ago. On  exam the patient is afebrile and non-toxic appearing. Her abdomen is soft and she has tenderness to palpation diffusely. No focal tenderness. No peritoneal signs. Mucous membranes are moist. No evidence of blood in her oropharynx. On digital rectal exam there is no gross bloody stool. Hemoccult is negative. Patient has chronic abdominal pain and has had multiple scans and I see no need for imaging at this time.  CBC reveals a hemoglobin of 8.4. This is a significant drop from her baseline of 11.7  from her last blood work here. She had a hemoglobin of 9.9 two days ago at [facility] in [place]. Will admit to medicine. Patient agrees with plan for admission.  I spoke with [name] from [facility] who accepted the patient for admission and she will place admission orders.   This patient was discussed with Dr. Claudine Mouton who agrees with assessment and plan.   Final Clinical Impressions(s) / ED Diagnoses   Final diagnoses:  Upper GI bleed  Anemia, unspecified type    New Prescriptions New Prescriptions   No medications on file     Waynetta Pean, PA-C 12/09/15 0720    Everlene Balls, MD 12/10/15 1223

## 2015-12-09 NOTE — ED Notes (Signed)
Portable at bedside 

## 2015-12-09 NOTE — Interval H&P Note (Signed)
History and Physical Interval Note:  12/09/2015 3:12 PM  Adrienne Flores  has presented today for surgery, with the diagnosis of Hematemesis  The various methods of treatment have been discussed with the patient and family. After consideration of risks, benefits and other options for treatment, the patient has consented to  Procedure(s): ESOPHAGOGASTRODUODENOSCOPY (EGD) (N/A) as a surgical intervention .  The patient's history has been reviewed, patient examined, no change in status, stable for surgery.  I have reviewed the patient's chart and labs.  Questions were answered to the patient's satisfaction.     Nelida Meuse III

## 2015-12-09 NOTE — ED Notes (Signed)
Difficulty obtaining labs, pt very difficult stick. MD and PA-C made aware, may attempt femoral stick for blood work.

## 2015-12-09 NOTE — H&P (Signed)
History and Physical    Adrienne Flores [id number] DOB: 1966-01-16 DOA: 12/09/2015   PCP: [name], MD /UNASSIGNED  Patient coming from/Resides with: Private residence/lives with husband  Admission status: Observation/medical floor -need to reevaluate in 24 hours in the [place] will be medically necessary to stay a minimum 2 midnights to rule out impending and/or unexpected changes in physiologic status that may differ from initial evaluation performed in the ER and/or at time of admission. Patient presents with acute issue of recurrent nausea and vomiting for 2 weeks primarily of food substances although one reported episode of black tarry emesis-12-24 hours of brick red/red emesis. Patient has chronic issues of ongoing weight loss since June of this year and chronic abdominal pain that has worsened causing her to eat one meal per day. At the present time she will be allowed only clear liquids and will likely transition to NPO, IV fluids, IV narcotic pain medications and will attempt to utilize home Ultram, GI has been consulted for consideration for endoscopy due to concerns of possible intractable emesis induced Mallory-Weiss tear. Patient has in place outpatient workup regarding her weight loss and chronic hoarseness (see below). She also has abnormal finding on plain abdominal films prompting CT to further evaluate with history of nonspecified colitis treated in June 2017.  Chief Complaint: Hematemesis  HPI: Adrienne Flores is a 50 y.o. female with medical history significant for rheumatoid arthritis not on biologic agents or chronic steroids, chronic back pain related to rheumatoid arthritis followed by pain clinic, chronic transaminitis, recent issues with progressive anemia and unexplained weight loss, chronic hoarseness, hypothyroidism and recent infectious colitis treated in June 2017. Patient reports over the past 2 weeks she has had recurrent emesis and setting of chronic  abdominal pain. She had previously reported to her PCP losing up to 2-4 pounds a week because of inability to eat secondary to abdominal pain. She reported eating only one meal per day at that time. She reports she has had worsened abdominal pain and increase in weight loss stating's in the past 2 weeks she has decreased her weight from 161 to 143 pounds. On review of patient's PCP visit in July her weight was 161 lbs. In addition to the emesis which was primarily food products, in the past 24 hours she's had several episodes of brick red to bloody red hematemesis. EDP reports no hematemesis or any other emesis while in the ER. Patient reports that she "just saw" her PCP although upon review of the electronic medical record she last saw her PCP in July with plans to work up the weight loss and chronic hoarseness. She was to follow-up on August 18 but it is documented she was a no-show. Since that time she has called the office twice for refills of medications. Since arrival to the ER patient has been complaining of significant pain and was requesting Dilaudid. She was given fentanyl by the ER physician 1 dose. She was found to have significantly worsened anemia with a hemoglobin of 8.4 down from around 11 and July and was found to be hyponatremic with a sodium of 130 with normal renal function. Her transaminitis is stable. Of note patient has a Port-A-Cath in place in the right upper chest which has been accessed in the ER.  ED Course:  Vital Signs: BP 114/77   Pulse 80   Temp 97.9 F (36.6 C) (Axillary)   Resp 14   SpO2 100%  Lab data: Sodium 1:30, potassium 3.7, chloride 99,  CO2 16, BUN 13, creatinine 0.88, anion gap 15, alkaline phosphatase 84, albumin 3.2, lipase 26, AST 62, ALT 37, total protein 6.3, total bilirubin 1.3, white count 4300 with normal differential, hemoglobin 8.4, platelets 176,000 Medications and treatments: Normal saline bolus 1 L 1, Zofran 4 mg IV 1, fentanyl 100 g IV 1, Reglan  10 mg IV 1  Review of Systems:  In addition to the HPI above,  No Fever-chills, myalgias or other constitutional symptoms No Headache, changes with Vision or hearing, new weakness, tingling, numbness in any extremity, dizziness, dysarthria or word finding difficulty, gait disturbance or imbalance, tremors or seizure activity No problems swallowing food or Liquids, indigestion/reflux, choking or coughing while eating No Chest pain, Cough or Shortness of Breath, palpitations, orthopnea or DOE No dark tarry stools, constipation No dysuria, malodorous urine, hematuria or flank pain No new skin rashes, lesions, masses or bruises, No new joint pains, aches, swelling or redness No recent unintentional weight gain  No polyuria, polydypsia or polyphagia   Past Medical History:  Diagnosis Date  . DDD (degenerative disc disease), lumbar   . Degenerative disc disease   . Depression   . Diverticulitis   . Fibromyalgia   . Fibromyalgia   . Fibromyalgia   . Gastroparesis   . H/O degenerative disc disease   . Hemorrhoids   . History of chicken pox   . Hyperthyroidism   . Hypokalemia   . Lupus   . 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 Psychogenic disorder   . Rheumatoid arthritis ([place])   . Rheumatoid arthritis ([place])   . Thyroid disease   . Ulcerative colitis [facility])     Past Surgical History:  Procedure Laterality Date  . ABDOMINAL HYSTERECTOMY    . CESAREAN SECTION     x2  . CHOLECYSTECTOMY    . KNEE SURGERY Left   . OOPHORECTOMY    . REPLACEMENT TOTAL KNEE Bilateral     Social History   Social History  . Marital status: Married    Spouse name: N/A  . Number of children: N/A  . Years of education: N/A   Occupational History  . Not on file.   Social History Main Topics  . Smoking status: Never Smoker  . Smokeless tobacco: Never Used  . Alcohol use Yes     Comment: social  . Drug use: No  . Sexual activity: Yes   Other Topics Concern  . Not on file   Social History Narrative  .  No narrative on file    Mobility: Without assistive devices Work history: Disabled   Allergies  Allergen Reactions  . Methotrexate Derivatives Other (See Comments)    headaches  . Morphine And Related Hives and Itching  . Relafen [Nabumetone] Other (See Comments)    headaches    Family History  Problem Relation Age of Onset  . Leukemia Mother   . Arthritis Mother   . Early death Mother   . Miscarriages / [place] Mother      Prior to Admission medications   Medication Sig Start Date End Date Taking? Authorizing Provider  amitriptyline (ELAVIL) 75 MG tablet Take 75 mg by mouth at bedtime.   Yes Historical Provider, MD  dicyclomine (BENTYL) 20 MG tablet Take 1 tablet (20 mg total) by mouth 2 (two) times daily. 10/22/14  Yes Gareth Morgan, MD  gabapentin (NEURONTIN) 600 MG tablet Take 600 mg by mouth 3 (three) times daily.   Yes Historical Provider, MD  levothyroxine (SYNTHROID, LEVOTHROID) 25 MCG  tablet Take 25 mcg by mouth daily before breakfast. Take with 399mcg to = 370mcg total   Yes Historical Provider, MD  levothyroxine (SYNTHROID, LEVOTHROID) 300 MCG tablet Take 300 mcg by mouth daily before breakfast. Take with a 45mcg tablet to = 360mcg   Yes Historical Provider, MD  meclizine (ANTIVERT) 25 MG tablet Take 1 tablet (25 mg total) by mouth 3 (three) times daily as needed. Patient taking differently: Take 25 mg by mouth 3 (three) times daily as needed for dizziness or nausea.  09/11/13  Yes Christopher Lawyer, PA-C  metoCLOPramide (REGLAN) 10 MG tablet Take 1 tablet (10 mg total) by mouth 4 (four) times daily. 05/03/13  Yes Carlisle Cater, PA-C  ondansetron (ZOFRAN ODT) 4 MG disintegrating tablet Take 1 tablet (4 mg total) by mouth every 8 (eight) hours as needed for nausea or vomiting. 10/22/14  Yes Gareth Morgan, MD  oxyCODONE-acetaminophen (PERCOCET/ROXICET) 5-325 MG tablet Take 1 tablet by mouth every 6 (six) hours as needed for pain. 12/02/15  Yes Historical Provider, MD    potassium chloride SA (K-DUR,KLOR-CON) 20 MEQ tablet Take 20 mEq by mouth daily. 11/18/15 11/17/16 Yes Historical Provider, MD    Physical Exam: Vitals:   12/09/15 0500 12/09/15 0545 12/09/15 0615 12/09/15 0645  BP: 111/66 106/73 101/67 114/77  Pulse: 82 83 77 80  Resp: 16 10 22 14   Temp:      TempSrc:      SpO2: 100% 100% 100% 100%      Constitutional: NAD, calm, uncomfortable-Appears pale and chronically ill Eyes: PERRL, lids and conjunctivae normal ENMT: Mucous membranes are dry. Posterior pharynx clear of any exudate or lesions.Normal dentition.  Neck: normal, supple, no masses, no thyromegaly Respiratory: clear to auscultation bilaterally, no wheezing, no crackles. Normal respiratory effort. No accessory muscle use.  Cardiovascular: Regular rate and rhythm, no murmurs / rubs / gallops. No extremity edema. 2+ pedal pulses. No carotid bruits. Port-A-Cath in place right upper chest. Abdomen: no tenderness appreciated upon exam on patient distracted, no masses palpated. No hepatosplenomegaly. Bowel sounds positive.  Musculoskeletal: no clubbing / cyanosis. No joint deformity upper and lower extremities. Good ROM, no contractures. Normal muscle tone.  Skin: no rashes, lesions, ulcers. No induration Neurologic: CN 2-12 grossly intact. Sensation intact, DTR normal. Strength 5/5 x all 4 extremities.  Psychiatric: Normal judgment and insight. Alert and oriented x 3. Anxious mood.    Labs on Admission: I have personally reviewed following labs and imaging studies  CBC:  Recent Labs Lab 12/09/15 0445  WBC 4.3  NEUTROABS 3.3  HGB 8.4*  HCT 26.0*  MCV 92.5  PLT 0000000   Basic Metabolic Panel:  Recent Labs Lab 12/09/15 0615 12/09/15 0817  NA 130*  --   K 3.7  --   CL 99*  --   CO2 16*  --   GLUCOSE 78  --   BUN 13  --   CREATININE 0.88  --   CALCIUM 8.2*  --   MG  --  1.7  PHOS  --  2.5   GFR: CrCl cannot be calculated (Unknown ideal weight.). Liver Function  Tests:  Recent Labs Lab 12/09/15 0615  AST 62*  ALT 37  ALKPHOS 84  BILITOT 1.3*  PROT 6.3*  ALBUMIN 3.2*    Recent Labs Lab 12/09/15 0615  LIPASE 26   No results for input(s): AMMONIA in the last 168 hours. Coagulation Profile: No results for input(s): INR, PROTIME in the last 168 hours. Cardiac Enzymes: No results  for input(s): CKTOTAL, CKMB, CKMBINDEX, TROPONINI in the last 168 hours. BNP (last 3 results) No results for input(s): PROBNP in the last 8760 hours. HbA1C: No results for input(s): HGBA1C in the last 72 hours. CBG: No results for input(s): GLUCAP in the last 168 hours. Lipid Profile: No results for input(s): CHOL, HDL, LDLCALC, TRIG, CHOLHDL, LDLDIRECT in the last 72 hours. Thyroid Function Tests: No results for input(s): TSH, T4TOTAL, FREET4, T3FREE, THYROIDAB in the last 72 hours. Anemia Panel:  Recent Labs  12/09/15 0721  RETICCTPCT 2.5   Urine analysis:    Component Value Date/Time   COLORURINE STRAW (A) 11/14/2014 1716   APPEARANCEUR CLEAR (A) 11/14/2014 1716   APPEARANCEUR Cloudy 06/03/2013 0028   LABSPEC 1.009 11/14/2014 1716   LABSPEC 1.008 06/03/2013 0028   PHURINE 7.0 11/14/2014 1716   GLUCOSEU NEGATIVE 11/14/2014 1716   GLUCOSEU Negative 06/03/2013 0028   HGBUR NEGATIVE 11/14/2014 1716   BILIRUBINUR NEGATIVE 11/14/2014 1716   BILIRUBINUR Negative 06/03/2013 0028   KETONESUR NEGATIVE 11/14/2014 1716   PROTEINUR NEGATIVE 11/14/2014 1716   UROBILINOGEN 1.0 10/22/2014 1928   NITRITE NEGATIVE 11/14/2014 1716   LEUKOCYTESUR NEGATIVE 11/14/2014 1716   LEUKOCYTESUR 3+ 06/03/2013 0028   Sepsis Labs: @LABRCNTIP (procalcitonin:4,lacticidven:4) )No results found for this or any previous visit (from the past 240 hour(s)).   Radiological Exams on Admission: Ct Abdomen Pelvis W Contrast  Result Date: 12/09/2015 CLINICAL DATA:  Diffuse abdominal pain, nausea and vomiting for the past 2 weeks. EXAM: CT ABDOMEN AND PELVIS WITH CONTRAST  TECHNIQUE: Multidetector CT imaging of the abdomen and pelvis was performed using the standard protocol following bolus administration of intravenous contrast. CONTRAST:  75 ISOVUE-300 IOPAMIDOL (ISOVUE-300) INJECTION 61% COMPARISON:  CT abdomen and pelvis - 03/04/2014; 08/10/2013 ; abdominal radiograph - earlier same day FINDINGS: Lower chest: Evaluation of lower thorax is degraded secondary to patient respiratory artifact. Minimal dependent subpleural ground-glass atelectasis within image right lower lobe. No focal airspace opacities. No pleural effusion. Hepatobiliary: Normal hepatic contour. No discrete hepatic lesions. Common bile duct is very mildly dilated measuring 7 mm in diameter with associated mild intrahepatic biliary duct dilatation, presumably secondary to biliary reservoir phenomenon secondary to post cholecystectomy state. No ascites. Pancreas: Normal appearance of the pancreas Spleen: Normal appearance of the spleen. Note is made of a small splenule. Adrenals/Urinary Tract: There is symmetric enhancement and excretion of the bilateral kidneys. No definite renal stones. No discrete renal lesions. No urine obstruction or perinephric stranding. Normal appearance of the bilateral adrenal glands. Normal appearance of the urinary bladder given degree distention. Stomach/Bowel: Scattered colonic diverticulosis without evidence of diverticulitis. There is mild rather diffuse circumferential colonic wall thickening involving the ascending (coronal image 49, series 5) and proximal transverse (images 39, series 2, coronal images 25 and 30, series 5), not resulting in enteric obstruction. Normal appearance of the terminal ileum and retrocecal appendix. No pneumoperitoneum, pneumatosis or portal venous gas. Vascular/Lymphatic: Moderate amount of mixed calcified and noncalcified atherosclerotic plaque within a normal caliber abdominal aorta, not resulting in a hemodynamically significant stenosis. No abdominal  aortic dissection or periaortic stranding. The branch vessels of the abdominal aorta appear patent on this non CTA examination. No bulky retroperitoneal, mesenteric, pelvic or inguinal lymphadenopathy. Reproductive: Post hysterectomy. No discrete adnexal lesion. No free fluid in the pelvic cul-de-sac. Other: Regional soft tissues appear normal. Musculoskeletal: No acute or aggressive osseous abnormalities. IMPRESSION: 1. Mild diffuse circumferential colonic wall thickening involving the ascending and proximal transverse colon, potentially accentuated due to underdistention though  a colitis could have a similar appearance. Clinical correlation is advised. No evidence of enteric obstruction. 2. Colonic diverticulosis without evidence of diverticulitis. 3.  Aortic Atherosclerosis (ICD10-170.0) Electronically Signed   By: Sandi Mariscal M.D.   On: 12/09/2015 09:02   Dg Abd Portable 1v  Result Date: 12/09/2015 CLINICAL DATA:  Abdominal pain today mostly left-sided as symptoms have been ongoing for 1 year. 55 pound weight loss. Nausea and vomiting. EXAM: PORTABLE ABDOMEN - 1 VIEW COMPARISON:  11/14/2014 and CT 03/04/2014 FINDINGS: Surgical clips over the right upper quadrant. Bowel gas pattern is nonobstructive. Haustral contour of the descending colon may be due to mild submucosal edema. No evidence of mass or mass effect. No abnormal calcifications. Mild degenerate change of the spine. Single surgical clip over the left lower quadrant. IMPRESSION: Nonobstructive bowel gas pattern. Haustral contour of the descending colon suggestion of mild submucosal edema. Given patient's symptoms, consider CT for further evaluation. Electronically Signed   By: Marin Olp M.D.   On: 12/09/2015 08:08      Assessment/Plan Principal Problem:   Hematemesis -Patient presents with abdominal pain, weight loss and reported hematemesis after 2 weeks of recurrent emesis concerning for possible Mallory-Weiss tear -Clear liquids -GI  consultation -IV Pepcid -FOB -No NSAIDs for pain at this juncture  Active Problems:   Symptomatic anemia -Hemoglobin has decreased from 11.2 in July 2017 to 8.4 -Check anemia panel **iron and vitamin B12 are normal and TSH (see abnormal results below) -FOB  -?? Uncertain if related to malnutrition from patient reports of poor oral intake and weight loss (see below) -Possibly related to recent hematemesis -Follow CBC -Transfuse only if hemoglobin less than 8.0    Acute hyponatremia -Suspect related to dehydration 2/2 nausea and vomiting and reports of poor oral intake -IV fluids -Follow chemistries    Chronic abdominal and back pain -Patient has chronic back pain secondary to underlying rheumatoid arthritis followed by [facility]  -Patient reports takes Percocet at home; also on Neurontin-Will use low-dose IV morphine with Benadryl for itching -Patient also has what appears to be a chronic abdominal pain in setting of recent colitis type symptoms in July; this has led to poor oral intake and apparent recurrent vomiting and reported weight loss -Portable abdominal film today reveals mild submucosal edema in the colon with CT recommended in this has been ordered -Patient denies diarrhea, fevers or chills -Alcohol level less than 5 -UDS pending    Rheumatoid arthritis  -Not on DMARD's or biologic agents    Unintended weight loss -Patient's PCP is aware and is in the process of working up -CT in June did not reveal explanation other than colitis which was a new finding and was not accompanied by diarrhea or bloody stools -Follow up on today's CT and anemia panel -If no significant GI findings on CT and if stools are Hemoccult negative and likely defer additional workup to PCP -Check weight **151 on 10/27 and ck pre-albumin -May benefit from nutritional consultation pending above results -Of note based on our weight graph from this facility patient's weight has  fluctuated between 169 and 173 between June and early September 2016 and has essentially remained steady between 166 and 167 from October 2016 to June 2017    Hypothyroidism -High-dose Synthroid -Per PCP notes is due TSH but has missed August appointment -Check TSH **37.434 !!-Check free T4 and T3 -?? Compliant with home dosing-PCP documented history of noncompliance with taking thyroid medications -If TSH remains abnormal  could explain some GI symptoms as well    Transaminitis -Apparently is chronic and stable    Chronic hoarseness -PCP aware and has arranged outpatient ENT evaluation -Husband reports that due to changes in his job he has been unable to take off time to take his wife to the appointments and she does not drive -TSH found to be markedly elevated and this could explain patient's chronic hoarseness      DVT prophylaxis: SCDs Code Status: Full Family Communication: Husband at bedside Disposition Plan: Anticipate discharge back to preadmission home environment Consults called: Gastroenterology/Dan[facility]     Samella Parr ANP-BC [facility] Pager [phone]   If 7PM-7AM, please contact night-coverage [email or web address] Password [email or web address]  12/09/2015, 9:06 AM

## 2015-12-09 NOTE — Progress Notes (Signed)
Initial Nutrition Assessment  DOCUMENTATION CODES:   Not applicable  INTERVENTION:  Once diet advances, RD to order nutritional supplements as appropriate.   Monitor magnesium, potassium, and phosphorus daily for at least 3 days once diet initiated, MD to replete as needed, as pt is at risk for refeeding syndrome given recurrent 2 week emesis.  NUTRITION DIAGNOSIS:   Inadequate oral intake related to inability to eat as evidenced by NPO status.  GOAL:   Patient will meet greater than or equal to 90% of their needs  MONITOR:   Diet advancement, Labs, Weight trends, Skin, I & O's  REASON FOR ASSESSMENT:   Malnutrition Screening Tool    ASSESSMENT:   50 y.o. female with a past medical history significant for degenerative disc disease, depression, diverticulitis, fibromyalgia, gastroparesis, hemorrhoids, hyperthyroidism, lupus, rheumatoid arthritis and ulcerative colitis, who presented to the ED on 12/09/2015 with complaint of 2 weeks of recurrent emesis in the setting of chronic abdominal pain. Recently the patient has had diagnosis of progressive anemia and unexplained weight loss along with chronic hoarseness, hypothyroidism and recent infectious colitis treated in June 2017.  Pt NPO and currently undergoing an EGD procedure. Per MD note, pt has a lost of appetite which has been ongoing over the past few months and has noticed weight loss which she explains that this is mostly due to the fact that when she would eat, her lower abdominal pain would worsen within 10-15 minutes which made her "scared to eat". Per Epic weight records, Pt with a 10% weight loss in 4 months. Unable to complete Nutrition-Focused physical exam at this time. RD to order nutritional supplements once diet advances.   Labs and medications reviewed. Potassium, phosphorous, magnesium WNL.   Diet Order:  Diet NPO time specified  Skin:  Reviewed, no issues  Last BM:  Unknown  Height:   Ht Readings from  Last 1 Encounters:  12/09/15 5\' 1"  (1.549 m)    Weight:   Wt Readings from Last 1 Encounters:  12/09/15 151 lb 0.2 oz (68.5 kg)    Ideal Body Weight:  47.7 kg  BMI:  Body mass index is 28.53 kg/m.  Estimated Nutritional Needs:   Kcal:  1700-1900  Protein:  80-90 grams  Fluid:  1.7 - 1.9 L/day  EDUCATION NEEDS:   No education needs identified at this time  Corrin Parker, MS, RD, LDN Pager # [phone] After hours/ weekend pager # [phone]

## 2015-12-09 NOTE — Op Note (Signed)
[facility] Patient Name: Adrienne Flores Procedure Date : 12/09/2015 MRN: [id number] Attending MD: Estill Cotta. Loletha Carrow , MD Date of Birth: Jan 22, 1966 CSN: [id number] Age: 50 Admit Type: Inpatient Procedure:                Upper GI endoscopy Indications:              Epigastric abdominal pain, Hematemesis, Nausea with                            vomiting, normocytic anemia Providers:                Mallie Mussel L. Loletha Carrow, MD, Carolynn Comment, RN, Ralene Bathe, Technician, Gershon Crane, CRNA Referring MD:              Medicines:                Monitored Anesthesia Care Complications:            No immediate complications. Estimated Blood Loss:     Estimated blood loss: none. Procedure:                Pre-Anesthesia Assessment:                           - Prior to the procedure, a History and Physical                            was performed, and patient medications and                            allergies were reviewed. The patient's tolerance of                            previous anesthesia was also reviewed. The risks                            and benefits of the procedure and the sedation                            options and risks were discussed with the patient.                            All questions were answered, and informed consent                            was obtained. Prior Anticoagulants: The patient has                            taken no previous anticoagulant or antiplatelet                            agents. ASA Grade Assessment: III - A patient with  severe systemic disease. After reviewing the risks                            and benefits, the patient was deemed in                            satisfactory condition to undergo the procedure.                           After obtaining informed consent, the endoscope was                            passed under direct vision. Throughout the         procedure, the patient's blood pressure, pulse, and                            oxygen saturations were monitored continuously. The                            [id number]) scope was introduced through the                            mouth, and advanced to the second part of duodenum.                            The upper GI endoscopy was accomplished without                            difficulty. The patient tolerated the procedure                            well. Scope In: Scope Out: Findings:      The esophagus was normal.      The stomach was normal.      The cardia and gastric fundus were normal on retroflexion.      The examined duodenum was normal. Impression:               - Normal esophagus.                           - Normal stomach.                           - Normal examined duodenum.                           - No specimens collected.                           Nothing seen to explain reported hematemesis. Moderate Sedation:      MAC sedation used Recommendation:           - Resume regular diet.                           - Follow up with outpatient GI physician in [place] after discharge.                           - Continue present medications. Procedure Code(s):        --- Professional ---                           [id number], Esophagogastroduodenoscopy, flexible,                            transoral; diagnostic, including collection of                            specimen(s) by brushing or washing, when performed                            (separate procedure) Diagnosis Code(s):        --- Professional ---                           R10.13, Epigastric pain                           K92.0, Hematemesis                           R11.2, Nausea with vomiting, unspecified CPT copyright 2016 [employer]. All rights reserved. The codes documented in this report are preliminary and upon coder review may  be revised to meet  current compliance requirements. Henry L. Loletha Carrow, MD 12/09/2015 3:55:41 PM This report has been signed electronically. Number of Addenda: 0

## 2015-12-09 NOTE — Progress Notes (Addendum)
Received report from [place], ED RN .   Pt arrived to unit on strecther accompained by ED staff member.   Pt on Room air. Porta cath in the right side of chest.   Pt is alert and orientated x4. Husband is at bedside. \  Pt is from home with husband.   Pt is able to get up from the bed with one assist to bedside commode.   Conducted skin assessment and noticed dry skin and scabs overall body. Pt stated they were from puppy she owns.  Brown discoloration under breasts also noted.    Orientated pt to room. Placed call light and phone within reach.  Pt understands to call before getting up and how to reach the nurses and Nt.  Pt denies any needs at this time. Will continue to monitor.   Paulla Fore, RN

## 2015-12-09 NOTE — ED Notes (Signed)
This RN attempted to access port-a-cath for blood work, unable to withdraw blood from site. Paged IV team to assess. Pt refused seconday PIV insertion.

## 2015-12-09 NOTE — ED Notes (Signed)
Patient transported to CT 

## 2015-12-09 NOTE — ED Notes (Signed)
Admitting NP at bedside

## 2015-12-10 DIAGNOSIS — Z90710 Acquired absence of both cervix and uterus: Secondary | ICD-10-CM | POA: Diagnosis not present

## 2015-12-10 DIAGNOSIS — Z8261 Family history of arthritis: Secondary | ICD-10-CM | POA: Diagnosis not present

## 2015-12-10 DIAGNOSIS — Z6828 Body mass index (BMI) 28.0-28.9, adult: Secondary | ICD-10-CM | POA: Diagnosis not present

## 2015-12-10 DIAGNOSIS — G894 Chronic pain syndrome: Secondary | ICD-10-CM | POA: Diagnosis present

## 2015-12-10 DIAGNOSIS — Z96653 Presence of artificial knee joint, bilateral: Secondary | ICD-10-CM | POA: Diagnosis present

## 2015-12-10 DIAGNOSIS — K3184 Gastroparesis: Secondary | ICD-10-CM | POA: Diagnosis present

## 2015-12-10 DIAGNOSIS — R109 Unspecified abdominal pain: Secondary | ICD-10-CM | POA: Diagnosis not present

## 2015-12-10 DIAGNOSIS — Z9114 Patient's other noncompliance with medication regimen: Secondary | ICD-10-CM | POA: Diagnosis not present

## 2015-12-10 DIAGNOSIS — M549 Dorsalgia, unspecified: Secondary | ICD-10-CM | POA: Diagnosis present

## 2015-12-10 DIAGNOSIS — Z806 Family history of leukemia: Secondary | ICD-10-CM | POA: Diagnosis not present

## 2015-12-10 DIAGNOSIS — E038 Other specified hypothyroidism: Secondary | ICD-10-CM | POA: Diagnosis not present

## 2015-12-10 DIAGNOSIS — M069 Rheumatoid arthritis, unspecified: Secondary | ICD-10-CM | POA: Diagnosis present

## 2015-12-10 DIAGNOSIS — E039 Hypothyroidism, unspecified: Secondary | ICD-10-CM | POA: Diagnosis present

## 2015-12-10 DIAGNOSIS — M797 Fibromyalgia: Secondary | ICD-10-CM | POA: Diagnosis present

## 2015-12-10 DIAGNOSIS — Z79891 Long term (current) use of opiate analgesic: Secondary | ICD-10-CM | POA: Diagnosis not present

## 2015-12-10 DIAGNOSIS — D62 Acute posthemorrhagic anemia: Secondary | ICD-10-CM | POA: Diagnosis present

## 2015-12-10 DIAGNOSIS — E876 Hypokalemia: Secondary | ICD-10-CM | POA: Diagnosis present

## 2015-12-10 DIAGNOSIS — D649 Anemia, unspecified: Secondary | ICD-10-CM | POA: Diagnosis not present

## 2015-12-10 DIAGNOSIS — E43 Unspecified severe protein-calorie malnutrition: Secondary | ICD-10-CM | POA: Diagnosis present

## 2015-12-10 DIAGNOSIS — K920 Hematemesis: Secondary | ICD-10-CM | POA: Diagnosis present

## 2015-12-10 DIAGNOSIS — E871 Hypo-osmolality and hyponatremia: Secondary | ICD-10-CM | POA: Diagnosis present

## 2015-12-10 LAB — COMPREHENSIVE METABOLIC PANEL
ALK PHOS: 78 U/L (ref 38–126)
ALT: 31 U/L (ref 14–54)
ANION GAP: 9 (ref 5–15)
AST: 38 U/L (ref 15–41)
Albumin: 2.9 g/dL — ABNORMAL LOW (ref 3.5–5.0)
BILIRUBIN TOTAL: 0.9 mg/dL (ref 0.3–1.2)
BUN: 7 mg/dL (ref 6–20)
CALCIUM: 7.9 mg/dL — AB (ref 8.9–10.3)
CO2: 20 mmol/L — ABNORMAL LOW (ref 22–32)
CREATININE: 0.9 mg/dL (ref 0.44–1.00)
Chloride: 102 mmol/L (ref 101–111)
Glucose, Bld: 76 mg/dL (ref 65–99)
Potassium: 2.9 mmol/L — ABNORMAL LOW (ref 3.5–5.1)
Sodium: 131 mmol/L — ABNORMAL LOW (ref 135–145)
TOTAL PROTEIN: 6 g/dL — AB (ref 6.5–8.1)

## 2015-12-10 LAB — CBC
HCT: 20.2 % — ABNORMAL LOW (ref 36.0–46.0)
HEMOGLOBIN: 6.6 g/dL — AB (ref 12.0–15.0)
MCH: 29.1 pg (ref 26.0–34.0)
MCHC: 32.7 g/dL (ref 30.0–36.0)
MCV: 89 fL (ref 78.0–100.0)
PLATELETS: 208 10*3/uL (ref 150–400)
RBC: 2.27 MIL/uL — AB (ref 3.87–5.11)
RDW: 16.6 % — ABNORMAL HIGH (ref 11.5–15.5)
WBC: 4 10*3/uL (ref 4.0–10.5)

## 2015-12-10 LAB — LACTATE DEHYDROGENASE: LDH: 131 U/L (ref 98–192)

## 2015-12-10 LAB — PREPARE RBC (CROSSMATCH)

## 2015-12-10 LAB — PROTIME-INR
INR: 1.1
Prothrombin Time: 14.3 seconds (ref 11.4–15.2)

## 2015-12-10 LAB — T4, FREE: FREE T4: 1.11 ng/dL (ref 0.61–1.12)

## 2015-12-10 LAB — ABO/RH: ABO/RH(D): O POS

## 2015-12-10 MED ORDER — POTASSIUM CHLORIDE CRYS ER 20 MEQ PO TBCR
40.0000 meq | EXTENDED_RELEASE_TABLET | ORAL | Status: AC
  Administered 2015-12-10 (×3): 40 meq via ORAL
  Filled 2015-12-10 (×3): qty 2

## 2015-12-10 MED ORDER — SODIUM CHLORIDE 0.9 % IV SOLN
Freq: Once | INTRAVENOUS | Status: AC
  Administered 2015-12-10: 15:00:00 via INTRAVENOUS

## 2015-12-10 MED ORDER — PROMETHAZINE HCL 25 MG/ML IJ SOLN
25.0000 mg | Freq: Four times a day (QID) | INTRAMUSCULAR | Status: DC | PRN
  Administered 2015-12-10 – 2015-12-12 (×4): 25 mg via INTRAVENOUS
  Filled 2015-12-10 (×4): qty 1

## 2015-12-10 MED ORDER — METRONIDAZOLE 500 MG PO TABS
500.0000 mg | ORAL_TABLET | Freq: Three times a day (TID) | ORAL | Status: DC
  Administered 2015-12-10 – 2015-12-14 (×12): 500 mg via ORAL
  Filled 2015-12-10 (×13): qty 1

## 2015-12-10 NOTE — Anesthesia Preprocedure Evaluation (Signed)
Anesthesia Evaluation  Patient identified by MRN, date of birth, ID band Patient awake    Reviewed: Allergy & Precautions, NPO status , Patient's Chart, lab work & pertinent test results  Airway Mallampati: II  TM Distance: >3 FB Neck ROM: Full    Dental  (+) Teeth Intact   Pulmonary    breath sounds clear to auscultation       Cardiovascular  Rhythm:Regular Rate:Normal     Neuro/Psych    GI/Hepatic   Endo/Other    Renal/GU      Musculoskeletal   Abdominal   Peds  Hematology   Anesthesia Other Findings   Reproductive/Obstetrics                             Anesthesia Physical Anesthesia Plan  ASA: III  Anesthesia Plan: MAC   Post-op Pain Management:    Induction:   Airway Management Planned: Natural Airway and Nasal Cannula  Additional Equipment:   Intra-op Plan:   Post-operative Plan:   Informed Consent: I have reviewed the patients History and Physical, chart, labs and discussed the procedure including the risks, benefits and alternatives for the proposed anesthesia with the patient or authorized representative who has indicated his/her understanding and acceptance.     Plan Discussed with: CRNA and Anesthesiologist  Anesthesia Plan Comments:         Anesthesia Quick Evaluation

## 2015-12-10 NOTE — Anesthesia Postprocedure Evaluation (Signed)
Anesthesia Post Note  Patient: Adrienne Flores  Procedure(s) Performed: Procedure(s) (LRB): ESOPHAGOGASTRODUODENOSCOPY (EGD) (N/A)  Patient location during evaluation: Endoscopy Anesthesia Type: MAC Level of consciousness: awake, awake and alert and oriented Pain management: pain level controlled Vital Signs Assessment: post-procedure vital signs reviewed and stable Respiratory status: spontaneous breathing, nonlabored ventilation, respiratory function stable and patient connected to nasal cannula oxygen Cardiovascular status: blood pressure returned to baseline Anesthetic complications: no    Last Vitals:  Vitals:   12/10/15 0558 12/10/15 1013  BP: 138/78 115/73  Pulse: (!) 59 90  Resp: 16 16  Temp: 36.9 C 36.8 C    Last Pain:  Vitals:   12/10/15 1013  TempSrc: Oral  PainSc:                  [name] COKER

## 2015-12-10 NOTE — Progress Notes (Signed)
PROGRESS NOTE    Adrienne Flores  [id number] DOB: 1965/02/28 DOA: 12/09/2015 PCP: [name], MD     Brief Narrative:  Adrienne Flores is a 50 y.o. female with medical history significant for rheumatoid arthritis not on biologic agents or chronic steroids, chronic back pain related to rheumatoid arthritis followed by pain clinic, chronic transaminitis, recent issues with progressive anemia and unexplained weight loss, chronic hoarseness, hypothyroidism and recent infectious colitis treated in June 2017. Patient reports over the past 2 weeks she has had recurrent emesis and setting of chronic abdominal pain as well as weight loss. In addition to the emesis, she has had several episodes of hematemesis. EDP reports no hematemesis or any other emesis while in the ER. Patient reports that she "just saw" her PCP although upon review of the electronic medical record she last saw her PCP in July with plans to work up the weight loss and chronic hoarseness. She was to follow-up on August 18 but it is documented she was a no-show. Since that time she has called the office twice for refills of medications. Since arrival to the ER patient has been complaining of significant pain and was requesting Dilaudid. She was given fentanyl by the ER physician 1 dose. She was found to have significantly worsened anemia with a hemoglobin of 8.4 down from around 11.   Assessment & Plan:   Principal Problem:   Symptomatic anemia Active Problems:   Hypothyroidism   Transaminitis   Chronic abdominal and back  pain   Rheumatoid arthritis (HCC)   Acute hyponatremia   Hematemesis   Chronic hoarseness   Unintended weight loss  Hematemesis -GI was consulted and patient underwent EGD on 10/27, without any evidence of source of hematemesis  -No NSAIDs or blood thinning products -No further episodes since admission   Symptomatic anemia -Iron studies normal, B12 and folate normal. FOBT negative    -Hemoglobin from 11.2 --> 8.4 --> 6.6 -Transfuse 2 units packed red blood cells today, H&H post-transfusion  -Check LDH, haptoglobin, INR   Possible colitis -C Diff canceled as patient has not had BM since Wednesday  -Started on cipro/flagyl 10/27    Hyponatremia -Stable   Hypokalemia  -Replace today   Hypothyroidism -TSH 37.434, T4 1.11, T3 pending  -PCP documented hx noncompliance with synthroid  -Synthroid 3105mcg daily   Chronic abdominal and chronic back pain Hx fibromyalgia Hx gastroparesis  Weight loss in setting of decreased oral intake, malnutrition  -Patient has chronic back pain secondary to underlying rheumatoid arthritis followed by [facility]. Patient reports takes Percocet at home; also on Neurontin.  -Chronic abdominal pain thought to be functional in nature by [facility].Has had this for the past 9 years per patient   -Weight from 167 (June 2017) --> 151 (Oct 2017) -Nutrition consult  -Reglan  -Zofran/phenergan for nausea   Rheumatoid arthritis  -Not on DMARD's or biologic agents  Chronic hoarseness -PCP has arranged outpatient ENT evaluation   DVT prophylaxis: SCDs Code Status: Full Family Communication: family at bedside Disposition Plan: pending further improvement and plan    Consultants:   GI  Procedures:   EGD 10/27  Antimicrobials:   Cipro/flagyl 10/27 >>    Subjective: Patient states that she has had chronic abdominal pain since she had caught a viral GI illness from a family member about 9 years ago. Ever since then, she states that she has had intermittent nausea, vomiting, decreased appetite and severe abdominal pain. She has seen many  different physicians for this. She states that the only time she remembers feeling better is when they put her on TPN at [place] 9 years ago. She states that her symptoms are intermittent in nature. Over the past several months, she has been unable to eat or keep anything down. She  has lost about 15 pounds due to decreased oral intake. She states that she had some vomiting with bright red blood prior to admission. No further episodes since this admission. She complains of severe fatigue, severe nausea, dry heaving, abdominal pain. She has not had a bowel movement since Wednesday. No dysuria.   Objective: Vitals:   12/09/15 1610 12/09/15 2141 12/10/15 0509 12/10/15 0558  BP: (!) 142/79 109/73 110/71 138/78  Pulse: 80 82 89 (!) 59  Resp: 15 17 16 16   Temp:  97.8 F (36.6 C) 98.5 F (36.9 C) 98.4 F (36.9 C)  TempSrc:  Oral Oral Oral  SpO2: 98% 93% 100% 100%  Weight:  68.3 kg (150 lb 8 oz)    Height:        Intake/Output Summary (Last 24 hours) at 12/10/15 0943 Last data filed at 12/10/15 0559  Gross per 24 hour  Intake             1830 ml  Output             1521 ml  Net              309 ml   Filed Weights   12/09/15 0919 12/09/15 2141  Weight: 68.5 kg (151 lb 0.2 oz) 68.3 kg (150 lb 8 oz)    Examination:  General exam: Crying, dry heaving  Respiratory system: Clear to auscultation. Respiratory effort normal. Cardiovascular system: S1 & S2 heard, RRR. No JVD, murmurs, rubs, gallops or clicks. No pedal edema. Gastrointestinal system: Abdomen is nondistended, soft and TTP diffusely but no guarding or rigidity. No organomegaly or masses felt. Normal bowel sounds heard. Central nervous system: Alert and oriented. No focal neurological deficits. Extremities: Symmetric 5 x 5 power. Skin: No rashes, lesions or ulcers  Data Reviewed: I have personally reviewed following labs and imaging studies  CBC:  Recent Labs Lab 12/09/15 0445 12/10/15 0440  WBC 4.3 4.0  NEUTROABS 3.3  --   HGB 8.4* 6.6*  HCT 26.0* 20.2*  MCV 92.5 89.0  PLT 176 [id number]   Basic Metabolic Panel:  Recent Labs Lab 12/09/15 0615 12/09/15 0817 12/10/15 0440  NA 130*  --  131*  K 3.7  --  2.9*  CL 99*  --  102  CO2 16*  --  20*  GLUCOSE 78  --  76  BUN 13  --  7  CREATININE  0.88  --  0.90  CALCIUM 8.2*  --  7.9*  MG  --  1.7  --   PHOS  --  2.5  --    GFR: Estimated Creatinine Clearance: 64.6 mL/min (by C-G formula based on SCr of 0.9 mg/dL). Liver Function Tests:  Recent Labs Lab 12/09/15 0615 12/10/15 0440  AST 62* 38  ALT 37 31  ALKPHOS 84 78  BILITOT 1.3* 0.9  PROT 6.3* 6.0*  ALBUMIN 3.2* 2.9*    Recent Labs Lab 12/09/15 0615  LIPASE 26   No results for input(s): AMMONIA in the last 168 hours. Coagulation Profile: No results for input(s): INR, PROTIME in the last 168 hours. Cardiac Enzymes: No results for input(s): CKTOTAL, CKMB, CKMBINDEX, TROPONINI in the last 168 hours. BNP (  last 3 results) No results for input(s): PROBNP in the last 8760 hours. HbA1C: No results for input(s): HGBA1C in the last 72 hours. CBG: No results for input(s): GLUCAP in the last 168 hours. Lipid Profile: No results for input(s): CHOL, HDL, LDLCALC, TRIG, CHOLHDL, LDLDIRECT in the last 72 hours. Thyroid Function Tests:  Recent Labs  12/09/15 0722 12/10/15 0445  TSH 37.434*  --   FREET4  --  1.11   Anemia Panel:  Recent Labs  12/09/15 0721  VITAMINB12 959*  FOLATE 6.9  FERRITIN 257  TIBC 245*  IRON 61  RETICCTPCT 2.5   Sepsis Labs: No results for input(s): PROCALCITON, LATICACIDVEN in the last 168 hours.  No results found for this or any previous visit (from the past 240 hour(s)).     Radiology Studies: Ct Abdomen Pelvis W Contrast  Result Date: 12/09/2015 CLINICAL DATA:  Diffuse abdominal pain, nausea and vomiting for the past 2 weeks. EXAM: CT ABDOMEN AND PELVIS WITH CONTRAST TECHNIQUE: Multidetector CT imaging of the abdomen and pelvis was performed using the standard protocol following bolus administration of intravenous contrast. CONTRAST:  75 ISOVUE-300 IOPAMIDOL (ISOVUE-300) INJECTION 61% COMPARISON:  CT abdomen and pelvis - 03/04/2014; 08/10/2013 ; abdominal radiograph - earlier same day FINDINGS: Lower chest: Evaluation of  lower thorax is degraded secondary to patient respiratory artifact. Minimal dependent subpleural ground-glass atelectasis within image right lower lobe. No focal airspace opacities. No pleural effusion. Hepatobiliary: Normal hepatic contour. No discrete hepatic lesions. Common bile duct is very mildly dilated measuring 7 mm in diameter with associated mild intrahepatic biliary duct dilatation, presumably secondary to biliary reservoir phenomenon secondary to post cholecystectomy state. No ascites. Pancreas: Normal appearance of the pancreas Spleen: Normal appearance of the spleen. Note is made of a small splenule. Adrenals/Urinary Tract: There is symmetric enhancement and excretion of the bilateral kidneys. No definite renal stones. No discrete renal lesions. No urine obstruction or perinephric stranding. Normal appearance of the bilateral adrenal glands. Normal appearance of the urinary bladder given degree distention. Stomach/Bowel: Scattered colonic diverticulosis without evidence of diverticulitis. There is mild rather diffuse circumferential colonic wall thickening involving the ascending (coronal image 49, series 5) and proximal transverse (images 39, series 2, coronal images 25 and 30, series 5), not resulting in enteric obstruction. Normal appearance of the terminal ileum and retrocecal appendix. No pneumoperitoneum, pneumatosis or portal venous gas. Vascular/Lymphatic: Moderate amount of mixed calcified and noncalcified atherosclerotic plaque within a normal caliber abdominal aorta, not resulting in a hemodynamically significant stenosis. No abdominal aortic dissection or periaortic stranding. The branch vessels of the abdominal aorta appear patent on this non CTA examination. No bulky retroperitoneal, mesenteric, pelvic or inguinal lymphadenopathy. Reproductive: Post hysterectomy. No discrete adnexal lesion. No free fluid in the pelvic cul-de-sac. Other: Regional soft tissues appear normal.  Musculoskeletal: No acute or aggressive osseous abnormalities. IMPRESSION: 1. Mild diffuse circumferential colonic wall thickening involving the ascending and proximal transverse colon, potentially accentuated due to underdistention though a colitis could have a similar appearance. Clinical correlation is advised. No evidence of enteric obstruction. 2. Colonic diverticulosis without evidence of diverticulitis. 3.  Aortic Atherosclerosis (ICD10-170.0) Electronically Signed   By: Sandi Mariscal M.D.   On: 12/09/2015 09:02   Dg Abd Portable 1v  Result Date: 12/09/2015 CLINICAL DATA:  Abdominal pain today mostly left-sided as symptoms have been ongoing for 1 year. 55 pound weight loss. Nausea and vomiting. EXAM: PORTABLE ABDOMEN - 1 VIEW COMPARISON:  11/14/2014 and CT 03/04/2014 FINDINGS: Surgical clips over the  right upper quadrant. Bowel gas pattern is nonobstructive. Haustral contour of the descending colon may be due to mild submucosal edema. No evidence of mass or mass effect. No abnormal calcifications. Mild degenerate change of the spine. Single surgical clip over the left lower quadrant. IMPRESSION: Nonobstructive bowel gas pattern. Haustral contour of the descending colon suggestion of mild submucosal edema. Given patient's symptoms, consider CT for further evaluation. Electronically Signed   By: Marin Olp M.D.   On: 12/09/2015 08:08      Scheduled Meds: . sodium chloride   Intravenous Once  . ciprofloxacin  400 mg Intravenous Q12H  . famotidine (PEPCID) IV  20 mg Intravenous Q12H  . gabapentin  600 mg Oral TID  . levothyroxine  25 mcg Oral QAC breakfast  . levothyroxine  300 mcg Oral QAC breakfast  . metoCLOPramide (REGLAN) injection  10 mg Intravenous Q6H  . metronidazole  500 mg Intravenous Q8H  . potassium chloride  40 mEq Oral Q4H   Continuous Infusions:    LOS: 0 days    Time spent: 40 minutes   Dessa Phi, DO [facility] [email or web address] Password [facility] 12/10/2015,  9:43 AM

## 2015-12-11 ENCOUNTER — Encounter (HOSPITAL_COMMUNITY): Payer: Self-pay | Admitting: Gastroenterology

## 2015-12-11 DIAGNOSIS — E038 Other specified hypothyroidism: Secondary | ICD-10-CM

## 2015-12-11 LAB — BASIC METABOLIC PANEL
ANION GAP: 7 (ref 5–15)
BUN: <5 mg/dL — ABNORMAL LOW (ref 6–20)
CALCIUM: 8.3 mg/dL — AB (ref 8.9–10.3)
CO2: 23 mmol/L (ref 22–32)
Chloride: 101 mmol/L (ref 101–111)
Creatinine, Ser: 0.76 mg/dL (ref 0.44–1.00)
GLUCOSE: 83 mg/dL (ref 65–99)
POTASSIUM: 3.5 mmol/L (ref 3.5–5.1)
Sodium: 131 mmol/L — ABNORMAL LOW (ref 135–145)

## 2015-12-11 LAB — CBC WITH DIFFERENTIAL/PLATELET
BASOS PCT: 1 %
Basophils Absolute: 0 10*3/uL (ref 0.0–0.1)
EOS PCT: 3 %
Eosinophils Absolute: 0.1 10*3/uL (ref 0.0–0.7)
HEMATOCRIT: 28.7 % — AB (ref 36.0–46.0)
Hemoglobin: 9.6 g/dL — ABNORMAL LOW (ref 12.0–15.0)
LYMPHS PCT: 20 %
Lymphs Abs: 0.8 10*3/uL (ref 0.7–4.0)
MCH: 29.5 pg (ref 26.0–34.0)
MCHC: 33.4 g/dL (ref 30.0–36.0)
MCV: 88.3 fL (ref 78.0–100.0)
MONO ABS: 0.4 10*3/uL (ref 0.1–1.0)
Monocytes Relative: 9 %
NEUTROS ABS: 2.6 10*3/uL (ref 1.7–7.7)
Neutrophils Relative %: 67 %
PLATELETS: 162 10*3/uL (ref 150–400)
RBC: 3.25 MIL/uL — AB (ref 3.87–5.11)
RDW: 15.9 % — AB (ref 11.5–15.5)
WBC: 3.9 10*3/uL — AB (ref 4.0–10.5)

## 2015-12-11 LAB — HAPTOGLOBIN: HAPTOGLOBIN: 92 mg/dL (ref 34–200)

## 2015-12-11 LAB — T3: T3, Total: 39 ng/dL — ABNORMAL LOW (ref 71–180)

## 2015-12-11 NOTE — Progress Notes (Addendum)
PROGRESS NOTE    Adrienne Flores  [id number] DOB: 04/20/1965 DOA: 12/09/2015 PCP: [name], MD     Brief Narrative:  Adrienne Flores is a 50 y.o. female with medical history significant for rheumatoid arthritis not on biologic agents or chronic steroids, chronic back pain related to rheumatoid arthritis followed by pain clinic, chronic transaminitis, recent issues with progressive anemia and unexplained weight loss, chronic hoarseness, hypothyroidism and recent infectious colitis treated in June 2017. Patient reports over the past 2 weeks she has had recurrent emesis and setting of chronic abdominal pain as well as weight loss. In addition to the emesis, she has had several episodes of hematemesis. EDP reports no hematemesis or any other emesis while in the ER. Patient reports that she "just saw" her PCP although upon review of the electronic medical record she last saw her PCP in July with plans to work up the weight loss and chronic hoarseness. She was to follow-up on August 18 but it is documented she was a no-show. Since that time she has called the office twice for refills of medications. Since arrival to the ER patient has been complaining of significant pain and was requesting Dilaudid. She was given fentanyl by the ER physician 1 dose. She was found to have significantly worsened anemia with a hemoglobin of 8.4 down from around 11 in July 2017  Assessment & Plan:   Principal Problem:   Symptomatic anemia Active Problems:   Hypothyroidism   Transaminitis   Chronic abdominal and back  pain   Rheumatoid arthritis (HCC)   Acute hyponatremia   Hematemesis   Chronic hoarseness   Unintended weight loss  Hematemesis -GI was consulted and patient underwent EGD on 10/27, without any evidence of source of hematemesis  -No NSAIDs or blood thinning products -No further episodes since admission  -Hemodynamically stable   Symptomatic anemia -Iron studies normal, B12 and  folate normal. FOBT negative  -Hemoglobin from 11.2 --> 8.4 --> 6.6 (2u pRBC on 10/28) --> 9.6  -Haptoglobin pending, but LDH is not indicative of hemolysis. No schistocytes seen. -Unclear etiology of anemia. Possibly lower GI bleed, but with possible colitis, would not recommend colonoscopy in this setting. Will need close follow up with outpatient GI physician in [facility]  Possible colitis -Last colonoscopy was at [facility] in 2015 -C Diff canceled as patient has not had BM since Wednesday  -Started on cipro/flagyl 10/27    Hyponatremia -Stable   Hypokalemia  -Improved with replacement   Hypothyroidism -TSH 37.434, T4 1.11, T3 pending  -PCP documented hx noncompliance with synthroid  -Synthroid 320mcg daily with repeat labs as outpatient   Chronic abdominal and chronic back pain Hx fibromyalgia Hx gastroparesis  Weight loss in setting of decreased oral intake, malnutrition  -Patient has chronic back pain secondary to underlying rheumatoid arthritis followed by [facility]. Patient reports takes Percocet at home; also on Neurontin.  -Chronic abdominal pain thought to be functional in nature by [facility]. Has had this intermittently for the past 9 years, been evaluated at numerous facilities and underwent many testing  -Weight from 167 (June 2017) --> 151 (Oct 2017) -Nutrition consult  -Reglan  -Zofran/phenergan for nausea   Rheumatoid arthritis  -Not on DMARD's or biologic agents  Chronic hoarseness -PCP has arranged outpatient ENT evaluation   DVT prophylaxis: SCDs Code Status: Full Family Communication: family at bedside Disposition Plan: pending further improvement and plan. PT/OT    Consultants:   GI  Procedures:  EGD 10/27  Antimicrobials:   Cipro/flagyl 10/27 >>    Subjective: She continues to complain of nausea, vomiting, abdominal pain. States she had one bout of bowel movement yesterday, no gross blood.    Objective: Vitals:    12/11/15 0237 12/11/15 0257 12/11/15 0558 12/11/15 0949  BP: 116/74 (!) 117/55 115/78 118/72  Pulse: 80 81 80 77  Resp: 18 18 17 18   Temp: 98.8 F (37.1 C) 98.8 F (37.1 C) 98.7 F (37.1 C) 98.5 F (36.9 C)  TempSrc: Oral Oral Oral Oral  SpO2: 98%  96% 99%  Weight:      Height:        Intake/Output Summary (Last 24 hours) at 12/11/15 1217 Last data filed at 12/11/15 1034  Gross per 24 hour  Intake              918 ml  Output             1375 ml  Net             -457 ml   Filed Weights   12/09/15 0919 12/09/15 2141 12/10/15 2159  Weight: 68.5 kg (151 lb 0.2 oz) 68.3 kg (150 lb 8 oz) 70.3 kg (155 lb)    Examination:  General exam: Appears comfortable, nontoxic appearing Respiratory system: Clear to auscultation. Respiratory effort normal. Cardiovascular system: S1 & S2 heard, RRR. No JVD, murmurs, rubs, gallops or clicks. No pedal edema. Gastrointestinal system: Abdomen is nondistended, soft and TTP diffusely but no guarding or rigidity. No organomegaly or masses felt. Normal bowel sounds heard. Abdominal Exam has improved since yesterday Central nervous system: Alert and oriented. No focal neurological deficits. Extremities: Symmetric 5 x 5 power. Skin: No rashes, lesions or ulcers  Data Reviewed: I have personally reviewed following labs and imaging studies  CBC:  Recent Labs Lab 12/09/15 0445 12/10/15 0440 12/11/15 0624  WBC 4.3 4.0 3.9*  NEUTROABS 3.3  --  2.6  HGB 8.4* 6.6* 9.6*  HCT 26.0* 20.2* 28.7*  MCV 92.5 89.0 88.3  PLT 176 208 0000000   Basic Metabolic Panel:  Recent Labs Lab 12/09/15 0615 12/09/15 0817 12/10/15 0440 12/11/15 0624  NA 130*  --  131* 131*  K 3.7  --  2.9* 3.5  CL 99*  --  102 101  CO2 16*  --  20* 23  GLUCOSE 78  --  76 83  BUN 13  --  7 <5*  CREATININE 0.88  --  0.90 0.76  CALCIUM 8.2*  --  7.9* 8.3*  MG  --  1.7  --   --   PHOS  --  2.5  --   --    GFR: Estimated Creatinine Clearance: 73.8 mL/min (by C-G formula based on  SCr of 0.76 mg/dL). Liver Function Tests:  Recent Labs Lab 12/09/15 0615 12/10/15 0440  AST 62* 38  ALT 37 31  ALKPHOS 84 78  BILITOT 1.3* 0.9  PROT 6.3* 6.0*  ALBUMIN 3.2* 2.9*    Recent Labs Lab 12/09/15 0615  LIPASE 26   No results for input(s): AMMONIA in the last 168 hours. Coagulation Profile:  Recent Labs Lab 12/10/15 1030  INR 1.10   Cardiac Enzymes: No results for input(s): CKTOTAL, CKMB, CKMBINDEX, TROPONINI in the last 168 hours. BNP (last 3 results) No results for input(s): PROBNP in the last 8760 hours. HbA1C: No results for input(s): HGBA1C in the last 72 hours. CBG: No results for input(s): GLUCAP in the last 168 hours.  Lipid Profile: No results for input(s): CHOL, HDL, LDLCALC, TRIG, CHOLHDL, LDLDIRECT in the last 72 hours. Thyroid Function Tests:  Recent Labs  12/09/15 0722 12/10/15 0445  TSH 37.434*  --   FREET4  --  1.11   Anemia Panel:  Recent Labs  12/09/15 0721  VITAMINB12 959*  FOLATE 6.9  FERRITIN 257  TIBC 245*  IRON 61  RETICCTPCT 2.5   Sepsis Labs: No results for input(s): PROCALCITON, LATICACIDVEN in the last 168 hours.  No results found for this or any previous visit (from the past 240 hour(s)).     Radiology Studies: No results found.    Scheduled Meds: . ciprofloxacin  400 mg Intravenous Q12H  . famotidine (PEPCID) IV  20 mg Intravenous Q12H  . gabapentin  600 mg Oral TID  . levothyroxine  25 mcg Oral QAC breakfast  . levothyroxine  300 mcg Oral QAC breakfast  . metoCLOPramide (REGLAN) injection  10 mg Intravenous Q6H  . metroNIDAZOLE  500 mg Oral Q8H   Continuous Infusions:    LOS: 1 day    Time spent: 25 minutes   Dessa Phi, DO [facility] [email or web address] Password [email or web address] 12/11/2015, 12:17 PM

## 2015-12-12 DIAGNOSIS — E43 Unspecified severe protein-calorie malnutrition: Secondary | ICD-10-CM | POA: Insufficient documentation

## 2015-12-12 LAB — CBC WITH DIFFERENTIAL/PLATELET
Basophils Absolute: 0 10*3/uL (ref 0.0–0.1)
Basophils Relative: 1 %
EOS ABS: 0.1 10*3/uL (ref 0.0–0.7)
Eosinophils Relative: 3 %
HCT: 29.9 % — ABNORMAL LOW (ref 36.0–46.0)
HEMOGLOBIN: 10.1 g/dL — AB (ref 12.0–15.0)
LYMPHS ABS: 0.6 10*3/uL — AB (ref 0.7–4.0)
Lymphocytes Relative: 18 %
MCH: 29.7 pg (ref 26.0–34.0)
MCHC: 33.8 g/dL (ref 30.0–36.0)
MCV: 87.9 fL (ref 78.0–100.0)
MONOS PCT: 8 %
Monocytes Absolute: 0.3 10*3/uL (ref 0.1–1.0)
NEUTROS PCT: 70 %
Neutro Abs: 2.4 10*3/uL (ref 1.7–7.7)
Platelets: 141 10*3/uL — ABNORMAL LOW (ref 150–400)
RBC: 3.4 MIL/uL — ABNORMAL LOW (ref 3.87–5.11)
RDW: 16.4 % — ABNORMAL HIGH (ref 11.5–15.5)
WBC: 3.4 10*3/uL — ABNORMAL LOW (ref 4.0–10.5)

## 2015-12-12 LAB — BASIC METABOLIC PANEL
Anion gap: 8 (ref 5–15)
CHLORIDE: 98 mmol/L — AB (ref 101–111)
CO2: 24 mmol/L (ref 22–32)
CREATININE: 0.69 mg/dL (ref 0.44–1.00)
Calcium: 8.4 mg/dL — ABNORMAL LOW (ref 8.9–10.3)
GFR calc Af Amer: >60 mL/min (ref >60)
GFR calc non Af Amer: >60 mL/min (ref >60)
GLUCOSE: 81 mg/dL (ref 65–99)
Potassium: 3.1 mmol/L — ABNORMAL LOW (ref 3.5–5.1)
SODIUM: 130 mmol/L — AB (ref 135–145)

## 2015-12-12 LAB — TYPE AND SCREEN
ABO/RH(D): O POS
Antibody Screen: NEGATIVE
UNIT DIVISION: 0
UNIT DIVISION: 0

## 2015-12-12 MED ORDER — CIPROFLOXACIN HCL 500 MG PO TABS
500.0000 mg | ORAL_TABLET | Freq: Two times a day (BID) | ORAL | Status: DC
  Administered 2015-12-12 – 2015-12-14 (×5): 500 mg via ORAL
  Filled 2015-12-12 (×5): qty 1

## 2015-12-12 MED ORDER — ENSURE ENLIVE PO LIQD
237.0000 mL | Freq: Three times a day (TID) | ORAL | Status: DC
  Administered 2015-12-12 – 2015-12-13 (×4): 237 mL via ORAL

## 2015-12-12 MED ORDER — POTASSIUM CHLORIDE CRYS ER 20 MEQ PO TBCR
40.0000 meq | EXTENDED_RELEASE_TABLET | ORAL | Status: AC
  Administered 2015-12-12 (×2): 40 meq via ORAL
  Filled 2015-12-12 (×2): qty 2

## 2015-12-12 MED ORDER — FAMOTIDINE 20 MG PO TABS
20.0000 mg | ORAL_TABLET | Freq: Every day | ORAL | Status: DC
  Administered 2015-12-12 – 2015-12-14 (×3): 20 mg via ORAL
  Filled 2015-12-12 (×3): qty 1

## 2015-12-12 MED ORDER — OXYCODONE-ACETAMINOPHEN 5-325 MG PO TABS
1.0000 | ORAL_TABLET | Freq: Four times a day (QID) | ORAL | Status: DC | PRN
  Administered 2015-12-12 – 2015-12-14 (×7): 1 via ORAL
  Filled 2015-12-12 (×8): qty 1

## 2015-12-12 NOTE — Progress Notes (Addendum)
PROGRESS NOTE    Adrienne Flores  [id number] DOB: 12/18/1965 DOA: 12/09/2015 PCP: [name], MD     Brief Narrative:  Adrienne Flores is a 50 y.o. female with medical history significant for rheumatoid arthritis not on biologic agents or chronic steroids, chronic back pain related to rheumatoid arthritis followed by pain clinic, chronic transaminitis, recent issues with progressive anemia and unexplained weight loss, chronic hoarseness, hypothyroidism and recent infectious colitis treated in June 2017. Patient reports over the past 2 weeks she has had recurrent emesis and setting of chronic abdominal pain as well as weight loss. In addition to the emesis, she has had several episodes of hematemesis. EDP reports no hematemesis or any other emesis while in the ER. Patient reports that she "just saw" her PCP although upon review of the electronic medical record she last saw her PCP in July with plans to work up the weight loss and chronic hoarseness. She was to follow-up on August 18 but it is documented she was a no-show. Since that time she has called the office twice for refills of medications. Since arrival to the ER patient has been complaining of significant pain and was requesting Dilaudid. She was given fentanyl by the ER physician 1 dose. She was found to have significantly worsened anemia with a hemoglobin of 8.4 down from around 11 in July 2017  Assessment & Plan:   Principal Problem:   Symptomatic anemia Active Problems:   Hypothyroidism   Transaminitis   Chronic abdominal and back  pain   Rheumatoid arthritis (HCC)   Acute hyponatremia   Hematemesis   Chronic hoarseness   Unintended weight loss  Hematemesis -GI was consulted and patient underwent EGD on 10/27, without any evidence of source of hematemesis  -No NSAIDs or blood thinning products -No further episodes since admission  -Hemodynamically stable   Symptomatic anemia -Iron studies normal, B12 and  folate normal. FOBT negative  -Hemoglobin from 11.2 --> 8.4 --> 6.6 (2u pRBC on 10/28) --> 9.6 --> 10.1  -Haptoglobin and LDH not indicative of hemolysis. No schistocytes seen. -Unclear etiology of anemia. Possibly lower GI bleed, but with possible colitis, would not recommend colonoscopy in this setting. Will need close follow up with outpatient GI physician in [place] colonoscopy was at [facility] in 2015 -C Diff canceled, not watery stool  -Started on cipro/flagyl 10/27 - switch to PO   Hyponatremia -Stable   Hypokalemia  -Replace   Hypothyroidism -TSH 37.434, T4 1.11, T3 pending  -PCP documented hx noncompliance with synthroid  -Synthroid 335mcg daily with repeat labs as outpatient   Chronic abdominal and chronic back pain Hx fibromyalgia Hx gastroparesis  Weight loss in setting of decreased oral intake, malnutrition  -Patient has chronic back pain secondary to underlying rheumatoid arthritis followed by [facility]. Patient reports takes Percocet at home; also on Neurontin.  -Chronic abdominal pain thought to be functional in nature by [facility]. Has had this intermittently for the past 9 years, been evaluated at numerous facilities and underwent many testing  -Weight from 167 (June 2017) --> 151 (Oct 2017) -Nutrition consult  -Reglan  -Zofran/phenergan for nausea   Rheumatoid arthritis  -Not on DMARD's or biologic agents  Chronic hoarseness -PCP has arranged outpatient ENT evaluation   DVT prophylaxis: SCDs Code Status: Full Family Communication: no family at bedside Disposition Plan: pending further improvement and plan. PT/OT and nutrition consult. Switch abx to PO    Consultants:   GI  Procedures:   EGD 10/27  Antimicrobials:   Cipro/flagyl 10/27 >>    Subjective: She continues to ask me for TPN. She states that 9 years ago, this is what has helped. I discussed with her that TPN is not a long-term solution regarding her  nutrition status. She states that eating is difficult because she cannot keep things down. No nausea or vomiting reported however. Bowel movement has been soft but not loose, reportedly dark but no gross blood. Afebrile, hemodynamically stable. No further episodes of bleeding. She then discussed possible feeding tube. I do not believe that this is a good solution for patient. Will consult nutrition today.    Objective: Vitals:   12/11/15 0949 12/11/15 1737 12/11/15 2040 12/12/15 0617  BP: 118/72 125/78 114/77 124/78  Pulse: 77 81 76 77  Resp: 18 18 17 16   Temp: 98.5 F (36.9 C) 98.5 F (36.9 C) 98.5 F (36.9 C) 99.7 F (37.6 C)  TempSrc: Oral Oral Oral Oral  SpO2: 99% 98% 98% 99%  Weight:   69.4 kg (153 lb)   Height:        Intake/Output Summary (Last 24 hours) at 12/12/15 0933 Last data filed at 12/12/15 0617  Gross per 24 hour  Intake              420 ml  Output             1450 ml  Net            -1030 ml   Filed Weights   12/09/15 2141 12/10/15 2159 12/11/15 2040  Weight: 68.3 kg (150 lb 8 oz) 70.3 kg (155 lb) 69.4 kg (153 lb)    Examination:  General exam: Appears comfortable, nontoxic appearing Respiratory system: Clear to auscultation. Respiratory effort normal. Cardiovascular system: S1 & S2 heard, RRR. No JVD, murmurs, rubs, gallops or clicks. No pedal edema. Gastrointestinal system: Abdomen is nondistended, soft and not TTP. No organomegaly or masses felt. Normal bowel sounds heard. Abdominal exam is benign  Central nervous system: Alert and oriented. No focal neurological deficits. Extremities: Symmetric 5 x 5 power. Skin: No rashes, lesions or ulcers  Data Reviewed: I have personally reviewed following labs and imaging studies  CBC:  Recent Labs Lab 12/09/15 0445 12/10/15 0440 12/11/15 0624 12/12/15 0357  WBC 4.3 4.0 3.9* 3.4*  NEUTROABS 3.3  --  2.6 2.4  HGB 8.4* 6.6* 9.6* 10.1*  HCT 26.0* 20.2* 28.7* 29.9*  MCV 92.5 89.0 88.3 87.9  PLT 176 208 162  [id number]*   Basic Metabolic Panel:  Recent Labs Lab 12/09/15 0615 12/09/15 0817 12/10/15 0440 12/11/15 0624 12/12/15 0357  NA 130*  --  131* 131* 130*  K 3.7  --  2.9* 3.5 3.1*  CL 99*  --  102 101 98*  CO2 16*  --  20* 23 24  GLUCOSE 78  --  76 83 81  BUN 13  --  7 <5* <5*  CREATININE 0.88  --  0.90 0.76 0.69  CALCIUM 8.2*  --  7.9* 8.3* 8.4*  MG  --  1.7  --   --   --   PHOS  --  2.5  --   --   --    GFR: Estimated Creatinine Clearance: 73.2 mL/min (by C-G formula based on SCr of 0.69 mg/dL). Liver Function Tests:  Recent Labs Lab 12/09/15 0615 12/10/15 0440  AST 62* 38  ALT 37 31  ALKPHOS 84 78  BILITOT 1.3* 0.9  PROT 6.3* 6.0*  ALBUMIN 3.2* 2.9*    Recent Labs Lab 12/09/15 0615  LIPASE 26   No results for input(s): AMMONIA in the last 168 hours. Coagulation Profile:  Recent Labs Lab 12/10/15 1030  INR 1.10   Cardiac Enzymes: No results for input(s): CKTOTAL, CKMB, CKMBINDEX, TROPONINI in the last 168 hours. BNP (last 3 results) No results for input(s): PROBNP in the last 8760 hours. HbA1C: No results for input(s): HGBA1C in the last 72 hours. CBG: No results for input(s): GLUCAP in the last 168 hours. Lipid Profile: No results for input(s): CHOL, HDL, LDLCALC, TRIG, CHOLHDL, LDLDIRECT in the last 72 hours. Thyroid Function Tests:  Recent Labs  12/10/15 0445  FREET4 1.11   Anemia Panel: No results for input(s): VITAMINB12, FOLATE, FERRITIN, TIBC, IRON, RETICCTPCT in the last 72 hours. Sepsis Labs: No results for input(s): PROCALCITON, LATICACIDVEN in the last 168 hours.  No results found for this or any previous visit (from the past 240 hour(s)).     Radiology Studies: No results found.    Scheduled Meds: . ciprofloxacin  500 mg Oral BID  . famotidine  20 mg Oral Daily  . gabapentin  600 mg Oral TID  . levothyroxine  25 mcg Oral QAC breakfast  . levothyroxine  300 mcg Oral QAC breakfast  . metoCLOPramide (REGLAN) injection  10 mg  Intravenous Q6H  . metroNIDAZOLE  500 mg Oral Q8H  . potassium chloride  40 mEq Oral Q4H   Continuous Infusions:    LOS: 2 days    Time spent: 25 minutes   Dessa Phi, DO [facility] [email or web address] Password [email or web address] 12/12/2015, 9:33 AM

## 2015-12-12 NOTE — Evaluation (Signed)
Physical Therapy Evaluation Patient Details Name: Adrienne Flores MRN: [id number] DOB: 1966-02-04 Today's Date: 12/12/2015   History of Present Illness  Adrienne Flores is a 50 y.o. female with a Past Medical History history of cyclic vomiting syndrome, diverticulitis, gastroparesis, colitis, cholecystectomy, lupus, fibromyalgia, and anxiety who presents with abdominal pain  Clinical Impression  Pt admitted with/for abdominal pain.  Pt currently limited functionally due to the problems listed. ( See problems list.)   Pt will benefit from PT to maximize function and safety in order to get ready for next venue listed below.     Follow Up Recommendations SNF    Equipment Recommendations  None recommended by PT    Recommendations for Other Services       Precautions / Restrictions Precautions Precautions: Fall      Mobility  Bed Mobility Overal bed mobility: Needs Assistance Bed Mobility: Supine to Sit;Sit to Supine     Supine to sit: Supervision Sit to supine: Supervision   General bed mobility comments: not assist needed  Transfers Overall transfer level: Needs assistance   Transfers: Sit to/from Stand Sit to Stand: Min assist         General transfer comment: min for steady assist due to significant instability  Ambulation/Gait Ambulation/Gait assistance: Min assist;Mod assist Ambulation Distance (Feet): 50 Feet Assistive device: 1 person hand held assist Gait Pattern/deviations: Step-through pattern   Gait velocity interpretation: Below normal speed for age/gender General Gait Details: significant unsteadiness with variable BOS staggering and scissoring in attempt to maintain balance.  Stairs            Wheelchair Mobility    Modified Rankin (Stroke Patients Only)       Balance Overall balance assessment: Needs assistance Sitting-balance support: No upper extremity supported Sitting balance-Leahy Scale: Good     Standing balance  support: No upper extremity supported;Single extremity supported Standing balance-Leahy Scale: Poor Standing balance comment: required external support for safety                             Pertinent Vitals/Pain Pain Assessment: Faces Faces Pain Scale: Hurts little more Pain Location: vague--stomach, knees Pain Descriptors / Indicators: Aching;Sore (gripey) Pain Intervention(s): Monitored during session    Home Living Family/patient expects to be discharged to:: Private residence Living Arrangements: Spouse/significant other Available Help at Discharge: Family;Available PRN/intermittently;Other (Comment) (spouse has lots of extracurricular activities ) Type of Home: House Home Access: Stairs to enter Entrance Stairs-Rails: Psychiatric nurse of Steps: several Home Layout: One Spangle: Environmental consultant - 2 wheels;Cane - single point;Bedside commode      Prior Function Level of Independence: Needs assistance   Gait / Transfers Assistance Needed: pt had gotten to the point she was unsafe to walk much  ADL's / Homemaking Assistance Needed: husband around when she bathed, BSC beside the bed.        Hand Dominance        Extremity/Trunk Assessment               Lower Extremity Assessment: Generalized weakness         Communication   Communication: No difficulties  Cognition Arousal/Alertness: Awake/alert Behavior During Therapy: Flat affect Overall Cognitive Status: Within Functional Limits for tasks assessed                      General Comments      Exercises     Assessment/Plan  PT Assessment Patient needs continued PT services  PT Problem List Decreased strength;Decreased activity tolerance;Decreased balance;Decreased mobility;Decreased knowledge of use of DME;Decreased knowledge of precautions          PT Treatment Interventions Gait training;Functional mobility training;Therapeutic activities;Balance  training;Patient/family education;DME instruction;Therapeutic exercise    PT Goals (Current goals can be found in the Care Plan section)  Acute Rehab PT Goals Patient Stated Goal: get better PT Goal Formulation: With patient Time For Goal Achievement: 12/26/15 Potential to Achieve Goals: Good    Frequency Min 3X/week   Barriers to discharge Decreased caregiver support husband at work and activities    Co-evaluation               End of Session   Activity Tolerance: Patient limited by fatigue Patient left: in bed;with call bell/phone within reach;with nursing/sitter in room Nurse Communication: Mobility status         Time: 1031-1100 PT Time Calculation (min) (ACUTE ONLY): 29 min   Charges:   PT Evaluation $PT Eval Moderate Complexity: 1 Procedure PT Treatments $Gait Training: 8-22 mins   PT G Codes:        Collen Vincent, Tessie Fass 12/12/2015, 11:15 AM 12/12/2015  Donnella Sham, PT [phone]  (pager)

## 2015-12-12 NOTE — Progress Notes (Signed)
Nutrition Follow-up  DOCUMENTATION CODES:   Severe malnutrition in context of chronic illness  INTERVENTION:  Provide Ensure Enlive po TID, each supplement provides 350 kcal and 20 grams of protein.  Encourage adequate PO intake.  NUTRITION DIAGNOSIS:   Malnutrition related to chronic illness as evidenced by percent weight loss, energy intake < or equal to 75% for > or equal to 1 month  GOAL:   Patient will meet greater than or equal to 90% of their needs; not met  MONITOR:   PO intake, Supplement acceptance, Labs, Weight trends, Skin, I & O's  REASON FOR ASSESSMENT:   Malnutrition Screening Tool    ASSESSMENT:   50 y.o. female with a past medical history significant for degenerative disc disease, depression, diverticulitis, fibromyalgia, gastroparesis, hemorrhoids, hyperthyroidism, lupus, rheumatoid arthritis and ulcerative colitis, who presented to the ED on 12/09/2015 with complaint of 2 weeks of recurrent emesis in the setting of chronic abdominal pain. Recently the patient has had diagnosis of progressive anemia and unexplained weight loss along with chronic hoarseness, hypothyroidism and recent infectious colitis treated in June 2017. GI was consulted and patient underwent EGD on 10/27, without any evidence of source of hematemesis.  Meal completion has been varied from 0-75%. Pt reports a loss of appetite and n/v which has been ongoing over the past 5 weeks. Pt reports poor po at home with only 1 meal a day at dinner time which she reports usually consists of a protein and starch. Pt with a 10% weight loss in 4 months. Pt is agreeable to nutritional supplements to aid in caloric and protein needs. Pt educated on the importance of adequate caloric and protein intake. Pt expressed understanding. During time of visit, pt only consumes a couple of bites of her lunch tray. Pt reports stomach pains soon afterwards and requested nausea medication. RN made aware.   Pt with no  observed significant fat or muscle mass loss.   Labs and medications reviewed.   Diet Order:  Diet regular Room service appropriate? Yes; Fluid consistency: Thin  Skin:  Reviewed, no issues  Last BM:  10/25  Height:   Ht Readings from Last 1 Encounters:  12/09/15 5' 1"  (1.549 m)    Weight:   Wt Readings from Last 1 Encounters:  12/11/15 153 lb (69.4 kg)    Ideal Body Weight:  47.7 kg  BMI:  Body mass index is 28.91 kg/m.  Estimated Nutritional Needs:   Kcal:  1700-1900  Protein:  80-90 grams  Fluid:  1.7 - 1.9 L/day  EDUCATION NEEDS:   No education needs identified at this time  Corrin Parker, MS, RD, LDN Pager # [phone] After hours/ weekend pager # [phone]

## 2015-12-13 ENCOUNTER — Encounter
Admission: RE | Admit: 2015-12-13 | Discharge: 2015-12-13 | Disposition: A | Discharge status: Home / Self Care | Payer: Medicare Other | Source: Ambulatory Visit | Attending: Internal Medicine | Admitting: Internal Medicine

## 2015-12-13 LAB — BASIC METABOLIC PANEL
Anion gap: 8 (ref 5–15)
BUN: 5 mg/dL — AB (ref 6–20)
CALCIUM: 8.6 mg/dL — AB (ref 8.9–10.3)
CO2: 24 mmol/L (ref 22–32)
CREATININE: 0.67 mg/dL (ref 0.44–1.00)
Chloride: 99 mmol/L — ABNORMAL LOW (ref 101–111)
GFR calc Af Amer: >60 mL/min (ref >60)
GLUCOSE: 83 mg/dL (ref 65–99)
Potassium: 3.7 mmol/L (ref 3.5–5.1)
Sodium: 131 mmol/L — ABNORMAL LOW (ref 135–145)

## 2015-12-13 LAB — CBC WITH DIFFERENTIAL/PLATELET
BASOS ABS: 0 10*3/uL (ref 0.0–0.1)
Basophils Relative: 1 %
EOS PCT: 1 %
Eosinophils Absolute: 0 10*3/uL (ref 0.0–0.7)
HCT: 31.4 % — ABNORMAL LOW (ref 36.0–46.0)
Hemoglobin: 10.6 g/dL — ABNORMAL LOW (ref 12.0–15.0)
LYMPHS ABS: 0.9 10*3/uL (ref 0.7–4.0)
LYMPHS PCT: 30 %
MCH: 29.7 pg (ref 26.0–34.0)
MCHC: 33.8 g/dL (ref 30.0–36.0)
MCV: 88 fL (ref 78.0–100.0)
MONO ABS: 0.3 10*3/uL (ref 0.1–1.0)
MONOS PCT: 10 %
Neutro Abs: 1.8 10*3/uL (ref 1.7–7.7)
Neutrophils Relative %: 58 %
PLATELETS: 140 10*3/uL — AB (ref 150–400)
RBC: 3.57 MIL/uL — ABNORMAL LOW (ref 3.87–5.11)
RDW: 15.8 % — AB (ref 11.5–15.5)
WBC: 3.1 10*3/uL — ABNORMAL LOW (ref 4.0–10.5)

## 2015-12-13 MED ORDER — HEPARIN SOD (PORK) LOCK FLUSH 100 UNIT/ML IV SOLN
500.0000 [IU] | INTRAVENOUS | Status: AC | PRN
  Administered 2015-12-13: 500 [IU]

## 2015-12-13 MED ORDER — PROMETHAZINE HCL 12.5 MG PO TABS: 12.5000 mg | ORAL_TABLET | Freq: Four times a day (QID) | ORAL | 0 refills | Status: DC | PRN

## 2015-12-13 MED ORDER — ALTEPLASE 2 MG IJ SOLR
2.0000 mg | Freq: Once | INTRAMUSCULAR | Status: AC
  Administered 2015-12-13: 2 mg
  Filled 2015-12-13 (×2): qty 2

## 2015-12-13 MED ORDER — PROMETHAZINE HCL 25 MG/ML IJ SOLN
25.0000 mg | Freq: Three times a day (TID) | INTRAMUSCULAR | Status: DC
  Administered 2015-12-14 (×2): 25 mg via INTRAVENOUS
  Filled 2015-12-13 (×3): qty 1

## 2015-12-13 MED ORDER — CIPROFLOXACIN HCL 500 MG PO TABS: 500.0000 mg | ORAL_TABLET | Freq: Two times a day (BID) | ORAL | 0 refills | Status: AC

## 2015-12-13 MED ORDER — MORPHINE SULFATE (PF) 2 MG/ML IV SOLN
1.0000 mg | Freq: Once | INTRAVENOUS | Status: AC | PRN
  Administered 2015-12-13: 1 mg via INTRAVENOUS
  Filled 2015-12-13: qty 1

## 2015-12-13 MED ORDER — ONDANSETRON HCL 4 MG/5ML PO SOLN
4.0000 mg | Freq: Three times a day (TID) | ORAL | Status: DC
  Administered 2015-12-13 – 2015-12-14 (×2): 4 mg via ORAL
  Filled 2015-12-13 (×3): qty 5

## 2015-12-13 MED ORDER — METRONIDAZOLE 500 MG PO TABS: 500.0000 mg | ORAL_TABLET | Freq: Three times a day (TID) | ORAL | 0 refills | Status: AC

## 2015-12-13 NOTE — Progress Notes (Signed)
Patient was set up for discharge to SNF. However, she started to have nausea and vomiting again this afternoon and not ready for discharge. She does not have anything anatomic that would be causing these symptoms; EGD was normal and CT abdomen/pelvis with mild colitis (being tx with cipro/flagyl). I will switch her zofran and phenergan from PRN to scheduled to see if we can better control her symptoms.   She has hx of chronic abdominal pain and vomiting. She follows with GI at [facility] and has been diagnosed with functional abdominal syndrome and she has had numerous testing, imaging, evaluation in the past. She was recently admitted at [facility] 10/24-10/26 with same symptoms and was thought to be due to poor compliance with synthroid. Patient tells me that her mother had these same symptoms and was diagnosed with a very rare disorder, but does not know what it is. I encouraged her to find out from family more information.    Dessa Phi, DO [facility] [email or web address] Password [email or web address] 12/13/2015, 4:30 PM

## 2015-12-13 NOTE — Clinical Social Work Note (Signed)
Clinical Social Work Assessment  Patient Details  Name: Adrienne Flores MRN: [id number] Date of Birth: 26-Jan-1966  Date of referral:  12/13/15               Reason for consult:  Facility Placement                Permission sought to share information with:  Family Supports Permission granted to share information::  Yes, Verbal Permission Granted  Name::     Jalesa Bigger  Agency::     Relationship::  Spouse  Contact Information:  [phone]  Housing/Transportation Living arrangements for the past 2 months:  Mobile Home Source of Information:  Patient Patient Interpreter Needed:  None Criminal Activity/Legal Involvement Pertinent to Current Situation/Hospitalization:  No - Comment as needed Significant Relationships:  Spouse Lives with:  Spouse Do you feel safe going back to the place where you live?  No Need for family participation in patient care:  Yes (Comment)  Care giving concerns:  Patient's husband Mawada Dewaard is currently working two jobs and taking care of his 52 year old mother.     Social Worker assessment / plan:  CSW spoke with patient about discharge plans and informed patient that PT is recommending a short term rehab.   Patient became tearful, stating that is used to being independent and  she feels that she is a burden to her family, as well as the hospital staff.  Mrs. Potempa has two children and 3 grandchildren whom she is not close to.  Patient stated her mother and father are deceased, as well as a brother who is deceased also.  Patient stated that she does have a sister that is still living.  Mrs. Towles stated that her husband has been a good support system, however he has two jobs and is currently taking care of his 52 year old mother.  CSW explained the facility search process, to the patient and she was open to any facility in [facility] except for [facility].  Mrs. Badenhop expressed interest in [facility] for her short term  rehab.  Patient was appreciative and thanked CSW for services.    Employment status:  Disabled (Comment on whether or not currently receiving Disability) Insurance information:  Medicare PT Recommendations:  [place] / Referral to community resources:  [place]  Patient/Family's Response to care:   Patient stated that she felt that the doctors in the hospital does not listen to her.  She did not go into detail about specifics.  Patient/Family's Understanding of and Emotional Response to Diagnosis, Current Treatment, and Prognosis: . Not discussed   Emotional Assessment Appearance:  Appears older than stated age Attitude/Demeanor/Rapport:  Crying Affect (typically observed):  Accepting, Tearful/Crying, Sad, Appropriate Orientation:  Oriented to Self, Oriented to Place, Oriented to  Time, Oriented to Situation Alcohol / Substance use:  Not Applicable Psych involvement (Current and /or in the community):  No (Comment)  Discharge Needs  Concerns to be addressed:  Discharge Planning Concerns Readmission within the last 30 days:  No Current discharge risk:  Dependent with Mobility Barriers to Discharge:  No Barriers Identified   Linward Headland, [place] Work 12/13/2015, 11:18 AM

## 2015-12-13 NOTE — NC FL2 (Signed)
[place] LEVEL OF CARE SCREENING TOOL     IDENTIFICATION  Patient Name: Adrienne Flores Birthdate: 1965-06-15 Sex: female Admission Date (Current Location): 12/09/2015  [facility] and [place] Number:  [employer] and Address:  The [place], [place] [address], [place], [place] [address]      Provider Number: [id number]  Attending Physician Name and Address:  Shon Millet*  Relative Name and Phone Number:  [name] (Spouse) - [phone]    Current Level of Care: Hospital Recommended Level of Care:  SNF Prior Approval Number:    Date Approved/Denied:   PASRR Number: [id number]  Discharge Plan: SNF    Current Diagnoses: Patient Active Problem List   Diagnosis Date Noted  . Protein-calorie malnutrition, severe 12/12/2015  . Symptomatic anemia 12/09/2015  . Acute hyponatremia 12/09/2015  . Hematemesis 12/09/2015  . Chronic hoarseness 12/09/2015  . Unintended weight loss 12/09/2015  . Rheu arthrit of right ank/ft w involv of organs and systems ([place]) 06/16/2014  . DDD (degenerative disc disease), cervical 06/16/2014  . 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 DDD (degenerative disc disease), lumbosacral 06/16/2014  . Chronic abdominal and back  pain 07/26/2013  . Nausea and vomiting 07/26/2013  . 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 Rheumatoid arthritis ([place]) 07/26/2013  . UTI (lower urinary tract infection) 07/26/2013  . Uncontrolled pain 07/26/2013  . Acute on chronic renal failure ([place]) 06/27/2013  . Colitis, infectious 06/25/2013  . AKI (acute kidney injury) ([place]) 06/25/2013  . Transaminitis 06/25/2013  . Infectious colitis 06/25/2013  . Abdominal pain 11/26/2012  . Hypothyroidism 11/26/2012    Orientation RESPIRATION BLADDER Height & Weight     Self, Time, Situation, Place  Normal Continent Weight: 151 lb 0.2 oz (68.5 kg) Height:  5\' 1"  (154.9 cm)  BEHAVIORAL SYMPTOMS/MOOD NEUROLOGICAL  BOWEL NUTRITION STATUS      Continent Diet (Thin Liquid)  AMBULATORY STATUS COMMUNICATION OF NEEDS Skin   Extensive Assist Verbally Other (Comment) (Surgical Incision)                       Personal Care Assistance Level of Assistance  Bathing, Feeding, Dressing Bathing Assistance: Limited assistance Feeding assistance: Independent Dressing Assistance: Limited assistance     Functional Limitations Info  Sight, Hearing, Speech Sight Info: Adequate Hearing Info: Adequate Speech Info: Adequate    SPECIAL CARE FACTORS FREQUENCY  PT (By licensed PT)     PT Frequency: PT Eval completed o 12/12/2015 with recommedation of a minimum of 3x/week              Contractures Contractures Info: Present    Additional Factors Info  Code Status, Allergies Code Status Info: Full Allergies Info: Methotrexate Derivatives, Morphine And Related, Relafen Nabumetone           Current Medications (12/13/2015):  This is the current hospital active medication list Current Facility-Administered Medications  Medication Dose Route Frequency Provider Last Rate Last Dose  . acetaminophen (TYLENOL) tablet 650 mg  650 mg Oral Q6H PRN Samella Parr, NP       Or  . acetaminophen (TYLENOL) suppository 650 mg  650 mg Rectal Q6H PRN Samella Parr, NP      . ciprofloxacin (CIPRO) tablet 500 mg  500 mg Oral BID Shon Millet, DO   500 mg at 12/13/15 0808  . diphenhydrAMINE (BENADRYL) injection 25 mg  25 mg Intravenous Q6H PRN Samella Parr, NP  25 mg at 12/10/15 0707  . famotidine (PEPCID) tablet 20 mg  20 mg Oral Daily Rich Fuchs Choi, DO   20 mg at 12/13/15 1027  . feeding supplement (ENSURE ENLIVE) (ENSURE ENLIVE) liquid 237 mL  237 mL Oral TID BM Jennifer Chahn-Yang Choi, DO   237 mL at 12/13/15 1027  . gabapentin (NEURONTIN) tablet 600 mg  600 mg Oral TID Samella Parr, NP   600 mg at 12/13/15 1027  . levothyroxine (SYNTHROID, LEVOTHROID) tablet 25 mcg  25 mcg Oral QAC  breakfast Samella Parr, NP   25 mcg at 12/13/15 [id number]  . levothyroxine (SYNTHROID, LEVOTHROID) tablet 300 mcg  300 mcg Oral QAC breakfast Samella Parr, NP   300 mcg at 12/13/15 0810  . metoCLOPramide (REGLAN) injection 10 mg  10 mg Intravenous Q6H Samella Parr, NP   10 mg at 12/13/15 0651  . metroNIDAZOLE (FLAGYL) tablet 500 mg  500 mg Oral Q8H Jennifer Chahn-Yang Choi, DO   500 mg at 12/13/15 [id number]  . ondansetron (ZOFRAN) tablet 4 mg  4 mg Oral Q6H PRN Samella Parr, NP   4 mg at 12/12/15 0950   Or  . ondansetron (ZOFRAN) injection 4 mg  4 mg Intravenous Q6H PRN Samella Parr, NP   4 mg at 12/13/15 0955  . oxyCODONE-acetaminophen (PERCOCET/ROXICET) 5-325 MG per tablet 1 tablet  1 tablet Oral Q6H PRN Shon Millet, DO   1 tablet at 12/13/15 [phone]  . promethazine (PHENERGAN) injection 25 mg  25 mg Intravenous Q6H PRN Shon Millet, DO   25 mg at 12/12/15 0708  . sodium chloride flush (NS) 0.9 % injection 10-40 mL  10-40 mL Intracatheter PRN Everlene Balls, MD   10 mL at 12/13/15 0358     Discharge Medications: Please see discharge summary for a list of discharge medications.  Relevant Imaging Results:  Relevant Lab Results:   Additional Information SSN:  [id number]  Lajoyce Lauber Work [phone]

## 2015-12-13 NOTE — Discharge Summary (Signed)
Physician Discharge Summary  Adrienne Flores [id number] DOB: Jun 25, 1965 DOA: 12/09/2015  PCP: [name], MD  Admit date: 12/09/2015 Discharge date: 12/13/2015  Admitted From: Home Disposition:  SNF  Recommendations for Outpatient Follow-up:  1. Follow up with PCP in 1-2 weeks 2. Follow up with outpatient GI in [facility] in 2 weeks  3. Finish course of Cipro and Flagyl for colitis. You may need a repeat colonoscopy once acute illness has resolved. Follow up with your physician outpatient.  4. Repeat thyroid studies as outpatient in 4-6 weeks. You must be compliant with your synthroid medication.  5. Please obtain BMP/CBC in one week.   Home Health: No  Equipment/Devices: None   Discharge Condition: Stable CODE STATUS: Full  Diet recommendation: General, as tolerated   Brief/Interim Summary: From H&P: Adrienne Flores a 50 y.o.femalewith medical history significant for rheumatoid arthritis not on biologic agents or chronic steroids, chronic back pain related to rheumatoid arthritis followed by pain clinic, chronic transaminitis, recent issues with progressive anemia and unexplained weight loss, chronic hoarseness, hypothyroidism and recent infectious colitis treated in June 2017. Patient reports over the past 2 weeks she has had recurrent emesis and setting of chronic abdominal pain as well as weight loss. In addition to the emesis, she has had several episodes of hematemesis. EDP reports no hematemesis or any other emesis while in the ER. Patient reports that she "just saw"her PCP although upon review of the electronic medical record she last saw her PCP in July with plans to work up the weight loss and chronic hoarseness. She was to follow-up on August 18 but it is documented she was a no-show. Since that time she has called the office twice for refills of medications. Since arrival to the ER patient has been complaining of significant pain and was requesting Dilaudid.  She was given fentanyl by the ER physician 1 dose. She was found to have significantly worsened anemia with a hemoglobin of 8.4 down from around 11 in July 2017  Interim: Due to reports of hematemesis and anemia, GI was consulted and she underwent EGD on 10/27, which was normal without any evidence of source of hematemesis. She had no further episodes of hematemesis during admission. She did require 2u pRBC during hospitalization with stable Hgb for 48 hours prior to discharge. Her anemia may have been secondary to colitis or lower GI bleed, but did not have any gross blood loss while in hospital. She will need close follow up with outpatient GI in [facility]. CT abdomen showed possible colitis. She was given cipro/flagyl.   Subjective on day of discharge: No new symptoms. Still nauseated and dry heaving with chronic abdominal pain which is not any worse than it has chronically been in the past 9 years. No fevers, CP, SOB. Hgb stable.   Discharge Diagnoses:  Principal Problem:   Symptomatic anemia Active Problems:   Hypothyroidism   Transaminitis   Chronic abdominal and back  pain   Rheumatoid arthritis (HCC)   Acute hyponatremia   Hematemesis   Chronic hoarseness   Unintended weight loss   Protein-calorie malnutrition, severe   Hematemesis -GI was consulted and patient underwent EGD on 10/27, without any evidence of source of hematemesis  -No NSAIDs or blood thinning products -No further episodes since admission  -Hemodynamically stable   Symptomatic anemia -Iron studies normal, B12 and folate normal. FOBT negative  -Hemoglobin from 11.2 --> 8.4 --> 6.6 (2u pRBC on 10/28) --> 9.6 --> 10.1  -Haptoglobin  and LDH not indicative of hemolysis. No schistocytes seen. -Unclear etiology of anemia. Possibly lower GI bleed, but with possible colitis, would not recommend colonoscopy in this setting. Will need close follow up with outpatient GI physician in [place]  colonoscopy was at [facility] in 2015 -C Diff canceled, not watery stool  -Started on cipro/flagyl 10/27 - switch to PO for total 10 day course   Hyponatremia -Stable   Hypothyroidism -TSH 37.434, T4 1.11, T3 pending  -PCP documented hx noncompliance with synthroid  -Synthroid 352mcg daily with repeat labs as outpatient   Chronic abdominal and chronic back pain Hx fibromyalgia Hx gastroparesis  Weight loss in setting of decreased oral intake, malnutrition  -Patient has chronic back pain secondary to underlying rheumatoid arthritis followed by [facility]. Patient reports takes Percocet at [employer] on [address].  -Chronic abdominal pain thought to be functional in nature by [facility]. Has had this intermittently for the past 9 years, been evaluated at numerous facilities and underwent many testing  -Weight from 167 (June 2017) --> 151 (Oct 2017) -Nutrition consult  -Reglan  -Zofran/phenergan for nausea   Rheumatoid arthritis  -Not on DMARD's or biologic agents  Chronic hoarseness -PCP has arranged outpatient ENT evaluation   Discharge Instructions  Discharge Instructions    Diet - low sodium heart healthy    Complete by:  As directed    Discharge instructions    Complete by:  As directed    Recommendations for Outpatient Follow-up:  Follow up with PCP in 1-2 weeks Follow up with outpatient GI in [facility] in 2 weeks  Finish course of Cipro and Flagyl for colitis. You may need a repeat colonoscopy once acute illness has resolved. Follow up with your physician outpatient.  Repeat thyroid studies as outpatient in 4-6 weeks. You must be compliant with your synthroid medication.  Please obtain BMP/CBC in one week.   Increase activity slowly    Complete by:  As directed        Medication List    STOP taking these medications   potassium chloride SA 20 MEQ tablet Commonly known as:  K-DUR,KLOR-CON     TAKE these medications   amitriptyline 75 MG  tablet Commonly known as:  ELAVIL Take 75 mg by mouth at bedtime.   ciprofloxacin 500 MG tablet Commonly known as:  CIPRO Take 1 tablet (500 mg total) by mouth 2 (two) times daily.   dicyclomine 20 MG tablet Commonly known as:  BENTYL Take 1 tablet (20 mg total) by mouth 2 (two) times daily.   gabapentin 600 MG tablet Commonly known as:  NEURONTIN Take 600 mg by mouth 3 (three) times daily.   levothyroxine 300 MCG tablet Commonly known as:  SYNTHROID, LEVOTHROID Take 300 mcg by mouth daily before breakfast. Take with a 45mcg tablet to = 314mcg   levothyroxine 25 MCG tablet Commonly known as:  SYNTHROID, LEVOTHROID Take 25 mcg by mouth daily before breakfast. Take with 342mcg to = 355mcg total   meclizine 25 MG tablet Commonly known as:  ANTIVERT Take 1 tablet (25 mg total) by mouth 3 (three) times daily as needed. What changed:  reasons to take this   metoCLOPramide 10 MG tablet Commonly known as:  REGLAN Take 1 tablet (10 mg total) by mouth 4 (four) times daily.   metroNIDAZOLE 500 MG tablet Commonly known as:  FLAGYL Take 1 tablet (500 mg total) by mouth every 8 (eight) hours.   ondansetron 4 MG disintegrating  tablet Commonly known as:  ZOFRAN ODT Take 1 tablet (4 mg total) by mouth every 8 (eight) hours as needed for nausea or vomiting.   oxyCODONE-acetaminophen 5-325 MG tablet Commonly known as:  PERCOCET/ROXICET Take 1 tablet by mouth every 6 (six) hours as needed for pain.   promethazine 12.5 MG tablet Commonly known as:  PHENERGAN Take 1 tablet (12.5 mg total) by mouth every 6 (six) hours as needed for nausea or vomiting.      Follow-up Information    [name], MD. Schedule an appointment as soon as possible for a visit in 1 week(s).   Specialty:  Family Medicine Contact information: [place] [address] [phone]        GI in [place]. Schedule an appointment as soon as possible for a visit in 2 week(s).           Allergies  Allergen Reactions  . Methotrexate Derivatives Other (See Comments)    headaches  . Morphine And Related Hives and Itching  . Relafen [Nabumetone] Other (See Comments)    headaches    Consultations:  None   Procedures/Studies: Ct Abdomen Pelvis W Contrast  Result Date: 12/09/2015 CLINICAL DATA:  Diffuse abdominal pain, nausea and vomiting for the past 2 weeks. EXAM: CT ABDOMEN AND PELVIS WITH CONTRAST TECHNIQUE: Multidetector CT imaging of the abdomen and pelvis was performed using the standard protocol following bolus administration of intravenous contrast. CONTRAST:  75 ISOVUE-300 IOPAMIDOL (ISOVUE-300) INJECTION 61% COMPARISON:  CT abdomen and pelvis - 03/04/2014; 08/10/2013 ; abdominal radiograph - earlier same day FINDINGS: Lower chest: Evaluation of lower thorax is degraded secondary to patient respiratory artifact. Minimal dependent subpleural ground-glass atelectasis within image right lower lobe. No focal airspace opacities. No pleural effusion. Hepatobiliary: Normal hepatic contour. No discrete hepatic lesions. Common bile duct is very mildly dilated measuring 7 mm in diameter with associated mild intrahepatic biliary duct dilatation, presumably secondary to biliary reservoir phenomenon secondary to post cholecystectomy state. No ascites. Pancreas: Normal appearance of the pancreas Spleen: Normal appearance of the spleen. Note is made of a small splenule. Adrenals/Urinary Tract: There is symmetric enhancement and excretion of the bilateral kidneys. No definite renal stones. No discrete renal lesions. No urine obstruction or perinephric stranding. Normal appearance of the bilateral adrenal glands. Normal appearance of the urinary bladder given degree distention. Stomach/Bowel: Scattered colonic diverticulosis without evidence of diverticulitis. There is mild rather diffuse circumferential colonic wall thickening involving the ascending (coronal image 49, series 5) and  proximal transverse (images 39, series 2, coronal images 25 and 30, series 5), not resulting in enteric obstruction. Normal appearance of the terminal ileum and retrocecal appendix. No pneumoperitoneum, pneumatosis or portal venous gas. Vascular/Lymphatic: Moderate amount of mixed calcified and noncalcified atherosclerotic plaque within a normal caliber abdominal aorta, not resulting in a hemodynamically significant stenosis. No abdominal aortic dissection or periaortic stranding. The branch vessels of the abdominal aorta appear patent on this non CTA examination. No bulky retroperitoneal, mesenteric, pelvic or inguinal lymphadenopathy. Reproductive: Post hysterectomy. No discrete adnexal lesion. No free fluid in the pelvic cul-de-sac. Other: Regional soft tissues appear normal. Musculoskeletal: No acute or aggressive osseous abnormalities. IMPRESSION: 1. Mild diffuse circumferential colonic wall thickening involving the ascending and proximal transverse colon, potentially accentuated due to underdistention though a colitis could have a similar appearance. Clinical correlation is advised. No evidence of enteric obstruction. 2. Colonic diverticulosis without evidence of diverticulitis. 3.  Aortic Atherosclerosis (ICD10-170.0) Electronically Signed   By: Jenny Reichmann  Watts M.D.   On: 12/09/2015 09:02   Dg Abd Portable 1v  Result Date: 12/09/2015 CLINICAL DATA:  Abdominal pain today mostly left-sided as symptoms have been ongoing for 1 year. 55 pound weight loss. Nausea and vomiting. EXAM: PORTABLE ABDOMEN - 1 VIEW COMPARISON:  11/14/2014 and CT 03/04/2014 FINDINGS: Surgical clips over the right upper quadrant. Bowel gas pattern is nonobstructive. Haustral contour of the descending colon may be due to mild submucosal edema. No evidence of mass or mass effect. No abnormal calcifications. Mild degenerate change of the spine. Single surgical clip over the left lower quadrant. IMPRESSION: Nonobstructive bowel gas pattern.  Haustral contour of the descending colon suggestion of mild submucosal edema. Given patient's symptoms, consider CT for further evaluation. Electronically Signed   By: Marin Olp M.D.   On: 12/09/2015 08:08       Discharge Exam: Vitals:   12/13/15 0506 12/13/15 1000  BP: 106/65 116/74  Pulse: 68 75  Resp: 18 20  Temp: 97.8 F (36.6 C) 97.9 F (36.6 C)   Vitals:   12/12/15 1600 12/12/15 2152 12/13/15 0506 12/13/15 1000  BP: 129/78 115/68 106/65 116/74  Pulse: 72 72 68 75  Resp: 16 16 18 20   Temp: 98.6 F (37 C) 98.4 F (36.9 C) 97.8 F (36.6 C) 97.9 F (36.6 C)  TempSrc: Oral Oral Oral Oral  SpO2: 99% 98% 99% 100%  Weight:  68.5 kg (151 lb 0.2 oz)    Height:        General: Pt is alert, awake, not in acute distress Cardiovascular: RRR, S1/S2 +, no rubs, no gallops Respiratory: CTA bilaterally, no wheezing, no rhonchi Abdominal: Soft, tender to palpation but no guarding or rigidity, no change from previous  Extremities: no edema, no cyanosis    The results of significant diagnostics from this hospitalization (including imaging, microbiology, ancillary and laboratory) are listed below for reference.     Microbiology: No results found for this or any previous visit (from the past 240 hour(s)).   Labs: BNP (last 3 results) No results for input(s): BNP in the last 8760 hours. Basic Metabolic Panel:  Recent Labs Lab 12/09/15 0615 12/09/15 0817 12/10/15 0440 12/11/15 0624 12/12/15 0357 12/13/15 0649  NA 130*  --  131* 131* 130* 131*  K 3.7  --  2.9* 3.5 3.1* 3.7  CL 99*  --  102 101 98* 99*  CO2 16*  --  20* 23 24 24   GLUCOSE 78  --  76 83 81 83  BUN 13  --  7 <5* <5* 5*  CREATININE 0.88  --  0.90 0.76 0.69 0.67  CALCIUM 8.2*  --  7.9* 8.3* 8.4* 8.6*  MG  --  1.7  --   --   --   --   PHOS  --  2.5  --   --   --   --    Liver Function Tests:  Recent Labs Lab 12/09/15 0615 12/10/15 0440  AST 62* 38  ALT 37 31  ALKPHOS 84 78  BILITOT 1.3* 0.9   PROT 6.3* 6.0*  ALBUMIN 3.2* 2.9*    Recent Labs Lab 12/09/15 0615  LIPASE 26   No results for input(s): AMMONIA in the last 168 hours. CBC:  Recent Labs Lab 12/09/15 0445 12/10/15 0440 12/11/15 0624 12/12/15 0357 12/13/15 0649  WBC 4.3 4.0 3.9* 3.4* 3.1*  NEUTROABS 3.3  --  2.6 2.4 1.8  HGB 8.4* 6.6* 9.6* 10.1* 10.6*  HCT 26.0* 20.2* 28.7*  29.9* 31.4*  MCV 92.5 89.0 88.3 87.9 88.0  PLT 176 208 162 141* 140*   Cardiac Enzymes: No results for input(s): CKTOTAL, CKMB, CKMBINDEX, TROPONINI in the last 168 hours. BNP: Invalid input(s): POCBNP CBG: No results for input(s): GLUCAP in the last 168 hours. D-Dimer No results for input(s): DDIMER in the last 72 hours. Hgb A1c No results for input(s): HGBA1C in the last 72 hours. Lipid Profile No results for input(s): CHOL, HDL, LDLCALC, TRIG, CHOLHDL, LDLDIRECT in the last 72 hours. Thyroid function studies No results for input(s): TSH, T4TOTAL, T3FREE, THYROIDAB in the last 72 hours.  Invalid input(s): FREET3 Anemia work up No results for input(s): VITAMINB12, FOLATE, FERRITIN, TIBC, IRON, RETICCTPCT in the last 72 hours. Urinalysis    Component Value Date/Time   COLORURINE STRAW (A) 11/14/2014 1716   APPEARANCEUR CLEAR (A) 11/14/2014 1716   APPEARANCEUR Cloudy 06/03/2013 0028   LABSPEC 1.009 11/14/2014 1716   LABSPEC 1.008 06/03/2013 0028   PHURINE 7.0 11/14/2014 1716   GLUCOSEU NEGATIVE 11/14/2014 1716   GLUCOSEU Negative 06/03/2013 0028   HGBUR NEGATIVE 11/14/2014 1716   BILIRUBINUR NEGATIVE 11/14/2014 1716   BILIRUBINUR Negative 06/03/2013 0028   KETONESUR NEGATIVE 11/14/2014 1716   PROTEINUR NEGATIVE 11/14/2014 1716   UROBILINOGEN 1.0 10/22/2014 1928   NITRITE NEGATIVE 11/14/2014 1716   LEUKOCYTESUR NEGATIVE 11/14/2014 1716   LEUKOCYTESUR 3+ 06/03/2013 0028   Sepsis Labs Invalid input(s): PROCALCITONIN,  WBC,  LACTICIDVEN Microbiology No results found for this or any previous visit (from the past 240  hour(s)).   Time coordinating discharge: Over 30 minutes  SIGNED:  Dessa Phi, DO [facility] Pager [phone]  If 7PM-7AM, please contact night-coverage [email or web address] Password [email or web address] 12/13/2015, 1:12 PM

## 2015-12-13 NOTE — Progress Notes (Signed)
Porta cath de accessed as pt had d/c orders for SNF and was ready to go but she started to have nausea and vomiting, notified Dr. Maylene Roes.  Pt will be discharged tomorrow and Dr. Maylene Roes wanted peripheral IV but IV team unable to start d/t poor veins.  IV team nurse accessed her porta cath as pt has prn IV Zofran and Phenergan for her nausea vomiting.  Notified Dr. Maylene Roes about porta cath access.

## 2015-12-13 NOTE — Evaluation (Signed)
Occupational Therapy Evaluation Patient Details Name: Adrienne Flores MRN: [id number] DOB: 1965-05-23 Today's Date: 12/13/2015    History of Present Illness Adrienne Flores is a 50 y.o. female with a Past Medical History history of cyclic vomiting syndrome, diverticulitis, gastroparesis, colitis, cholecystectomy, lupus, fibromyalgia, and anxiety who presents with abdominal pain   Clinical Impression   Pt reports being able to bathe and dress herself with her husband assisting with tub transfers. Pt with low activity tolerance and impaired standing balance. Pt will need post acute rehab in SNF prior to return home. Will follow acutely.    Follow Up Recommendations  SNF    Equipment Recommendations       Recommendations for Other Services       Precautions / Restrictions Precautions Precautions: Fall      Mobility Bed Mobility Overal bed mobility: Needs Assistance Bed Mobility: Supine to Sit;Sit to Supine     Supine to sit: Supervision Sit to supine: Supervision   General bed mobility comments: no assist needed  Transfers Overall transfer level: Needs assistance Equipment used: 1 person hand held assist Transfers: Sit to/from Stand;Stand Pivot Transfers Sit to Stand: Min assist Stand pivot transfers: Min assist       General transfer comment: min assist to steady    Balance     Sitting balance-Leahy Scale: Good       Standing balance-Leahy Scale: Poor                              ADL Overall ADL's : Needs assistance/impaired Eating/Feeding: Independent;Bed level   Grooming: Wash/dry hands;Wash/dry face;Sitting;Set up   Upper Body Bathing: Set up;Sitting   Lower Body Bathing: Minimal assistance;Sit to/from stand   Upper Body Dressing : Set up;Sitting   Lower Body Dressing: Minimal assistance;Sit to/from stand   Toilet Transfer: Minimal assistance;Stand-pivot;BSC   Toileting- Clothing Manipulation and Hygiene: Minimal  assistance;Sit to/from stand               Vision     Perception     Praxis      Pertinent Vitals/Pain Pain Assessment: Faces Faces Pain Scale: Hurts little more Pain Location: stomach Pain Descriptors / Indicators: Aching Pain Intervention(s): Monitored during session     Hand Dominance Right   Extremity/Trunk Assessment Upper Extremity Assessment Upper Extremity Assessment: Overall WFL for tasks assessed   Lower Extremity Assessment Lower Extremity Assessment: Generalized weakness       Communication Communication Communication: No difficulties   Cognition Arousal/Alertness: Awake/alert Behavior During Therapy: Flat affect Overall Cognitive Status: Within Functional Limits for tasks assessed                     General Comments       Exercises       Shoulder Instructions      Home Living Family/patient expects to be discharged to:: Private residence Living Arrangements: Spouse/significant other Available Help at Discharge: Family;Available PRN/intermittently (husband works and cares for his elderly mother) Type of Home: House Home Access: Stairs to enter Technical brewer of Steps: several Entrance Stairs-Rails: Right;Left Home Layout: One level     Bathroom Shower/Tub: Teacher, early years/pre: Standard     Home Equipment: Environmental consultant - 2 wheels;Cane - single point;Bedside commode;Shower seat          Prior Functioning/Environment Level of Independence: Needs assistance  Gait / Transfers Assistance Needed: pt had gotten to the  point she was unsafe to walk much ADL's / Homemaking Assistance Needed: husband assisted with tub transfer, was eating little, but able to take care of herself            OT Problem List: Decreased activity tolerance;Impaired balance (sitting and/or standing);Decreased strength;Decreased knowledge of use of DME or AE;Pain   OT Treatment/Interventions: Self-care/ADL training;DME and/or AE  instruction;Patient/family education;Balance training;Therapeutic activities    OT Goals(Current goals can be found in the care plan section) Acute Rehab OT Goals Patient Stated Goal: be able to eat  OT Goal Formulation: With patient Time For Goal Achievement: 12/27/15 Potential to Achieve Goals: Good ADL Goals Pt Will Perform Grooming: with supervision;standing Pt Will Perform Lower Body Bathing: with supervision;sit to/from stand Pt Will Perform Lower Body Dressing: with supervision;sit to/from stand Pt Will Transfer to Toilet: with supervision;ambulating;regular height toilet Pt Will Perform Toileting - Clothing Manipulation and hygiene: with supervision;sit to/from stand  OT Frequency: Min 2X/week   Barriers to D/C: Decreased caregiver support          Co-evaluation              End of Session    Activity Tolerance: Patient limited by fatigue Patient left: in bed;with call bell/phone within reach;with bed alarm set   Time: [id number] OT Time Calculation (min): 18 min Charges:  OT General Charges $OT Visit: 1 Procedure OT Evaluation $OT Eval Moderate Complexity: 1 Procedure G-Codes:    Malka So 12/13/2015, 12:12 PM [phone]

## 2015-12-13 NOTE — Progress Notes (Signed)
Paged doctor about pt stating she has abdominal pain. 10 out of 10 on pain scale.   Last Percocet given at 1757 and order states to gave every 6 hrs- not time for administration.  Pt has Tylenol PRN but refused when offered the medication.   Notified the doctor through [facility] - waiting for orders.  Paulla Fore, RN.

## 2015-12-13 NOTE — Clinical Social Work Placement (Signed)
   CLINICAL SOCIAL WORK PLACEMENT  NOTE  Date:  12/13/2015  Patient Details  Name: Adrienne Flores MRN: [id number] Date of Birth: Jan 21, 1966  Clinical Social Work is seeking post-discharge placement for this patient at the [place] level of care (*CSW will initial, date and re-position this form in  chart as items are completed):  Yes   Patient/family provided with [place] Work Department's list of facilities offering this level of care within the geographic area requested by the patient (or if unable, by the patient's family).  Yes   Patient/family informed of their freedom to choose among providers that offer the needed level of care, that participate in Medicare, Medicaid or managed care program needed by the patient, have an available bed and are willing to accept the patient.  Yes   Patient/family informed of [place]'s ownership interest in [facility] and [facility], as well as of the fact that they are under no obligation to receive care at these facilities.  PASRR submitted to EDS on 12/13/15     PASRR number received on 12/13/15     Existing PASRR number confirmed on       FL2 transmitted to all facilities in geographic area requested by pt/family on 12/13/15     FL2 transmitted to all facilities within larger geographic area on       Patient informed that his/her managed care company has contracts with or will negotiate with certain facilities, including the following:        Yes   Patient/family informed of bed offers received.  Patient chooses bed at [facility]     Physician recommends and patient chooses bed at      Patient to be transferred to [facility] on 12/13/15.  Patient to be transferred to facility by Private Vehicle     Patient family notified on 12/13/15 of transfer.  Name of family member notified:  Karilyn Cota (Spouse)     PHYSICIAN       Additional Comment:     _______________________________________________ Linward Headland, [place] Work 12/13/2015, 12:25 PM

## 2015-12-13 NOTE — Clinical Social Work Note (Signed)
Patient was initially going to discharge today and facility search initiated and [facility] skilled nursing facility chosen for ST rehab. CSW intern Linward Headland and CSW informed by patient's nurse that patient will not discharge today. Call made to facility by CSW Intern to [facility] and admissions director advised. CSW will continue to monitor patient's progress and facilitate discharge to SNF when medically stable.  Kendarious Gudino Givens, MSW, LCSW Licensed Clinical Social Worker [place] [phone]

## 2015-12-14 ENCOUNTER — Encounter
Admission: RE | Admit: 2015-12-14 | Discharge: 2015-12-14 | Disposition: A | Discharge status: Home / Self Care | Payer: Medicare Other | Source: Ambulatory Visit | Attending: Internal Medicine | Admitting: Internal Medicine

## 2015-12-14 LAB — BASIC METABOLIC PANEL
ANION GAP: 10 (ref 5–15)
BUN: 5 mg/dL — ABNORMAL LOW (ref 6–20)
CALCIUM: 8.6 mg/dL — AB (ref 8.9–10.3)
CO2: 24 mmol/L (ref 22–32)
CREATININE: 0.75 mg/dL (ref 0.44–1.00)
Chloride: 97 mmol/L — ABNORMAL LOW (ref 101–111)
GLUCOSE: 81 mg/dL (ref 65–99)
Potassium: 3.6 mmol/L (ref 3.5–5.1)
Sodium: 131 mmol/L — ABNORMAL LOW (ref 135–145)

## 2015-12-14 LAB — CBC WITH DIFFERENTIAL/PLATELET
BASOS ABS: 0 10*3/uL (ref 0.0–0.1)
BASOS PCT: 1 %
EOS PCT: 2 %
Eosinophils Absolute: 0.1 10*3/uL (ref 0.0–0.7)
HCT: 30.5 % — ABNORMAL LOW (ref 36.0–46.0)
Hemoglobin: 10.3 g/dL — ABNORMAL LOW (ref 12.0–15.0)
Lymphocytes Relative: 34 %
Lymphs Abs: 1.1 10*3/uL (ref 0.7–4.0)
MCH: 29.1 pg (ref 26.0–34.0)
MCHC: 33.8 g/dL (ref 30.0–36.0)
MCV: 86.2 fL (ref 78.0–100.0)
MONO ABS: 0.5 10*3/uL (ref 0.1–1.0)
MONOS PCT: 14 %
Neutro Abs: 1.6 10*3/uL — ABNORMAL LOW (ref 1.7–7.7)
Neutrophils Relative %: 49 %
PLATELETS: 157 10*3/uL (ref 150–400)
RBC: 3.54 MIL/uL — ABNORMAL LOW (ref 3.87–5.11)
RDW: 15.1 % (ref 11.5–15.5)
WBC: 3.3 10*3/uL — ABNORMAL LOW (ref 4.0–10.5)

## 2015-12-14 MED ORDER — HEPARIN SOD (PORK) LOCK FLUSH 100 UNIT/ML IV SOLN
500.0000 [IU] | INTRAVENOUS | Status: AC | PRN
Start: 2015-12-14 — End: 2015-12-14
  Administered 2015-12-14: 500 [IU]

## 2015-12-14 MED ORDER — OXYCODONE-ACETAMINOPHEN 5-325 MG PO TABS: 1.0000 | ORAL_TABLET | Freq: Four times a day (QID) | ORAL | 0 refills | Status: DC | PRN

## 2015-12-14 MED ORDER — GABAPENTIN 600 MG PO TABS: 600.0000 mg | ORAL_TABLET | Freq: Three times a day (TID) | ORAL | 0 refills | Status: DC

## 2015-12-14 MED ORDER — METOCLOPRAMIDE HCL 10 MG PO TABS
10.0000 mg | ORAL_TABLET | Freq: Three times a day (TID) | ORAL | Status: DC
  Administered 2015-12-14: 10 mg via ORAL
  Filled 2015-12-14: qty 1

## 2015-12-14 NOTE — Progress Notes (Signed)
[facility] PROGRESS NOTE  Adrienne Flores [id number] DOB: [birth year] DOA: 12/09/2015   50 year old Caucasian female with the multiple medical problems as noted in previous notes, presented to the hospital with complaints of nausea, vomiting and abdominal pain. Most of her issues are chronic. She has seen providers including gastroenterology [facility]. According to a note from March 2016 by Dr. 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 Referral at that time was sent to GI psychiatry.  Patient was hospitalized to [facility] due to reports of hematemesis and anemia. Gastroenterology was consulted and the patient underwent EGD which did not show any abnormality whatsoever. She also underwent CT scan of her abdomen and pelvis which raised concern for colitis. Patient was placed on antibiotics. She was transfused 2 units of blood.  Patient remained stable for the most part, however, continued to have nausea and poor oral intake. She was given antiemetics. She was placed on her Reglan which was also given intravenously. Her abdomen remains benign. However, gastroenterologist suggested that she go back to see GI at [facility].   Her symptoms were discussed in detail with the patient and it does appear that these have been ongoing for a long time. She's had multiple ED visits for the same. Care-everywhere was also reviewed and suggested the same.   Patient was reassured. She was told that she should seek further care for her chronic issues at [facility] as she had done previously. From a medical standpoint, patient is stable. Her blood work has been stable. Her vital signs are stable. She should try eating small meals. Continue with Reglan half an hour before each meal as mentioned in the discharge summary.  Medically stable for her to go to skilled nursing facility at this time.   Vital Signs  Vitals:   12/13/15 1827 12/13/15 2336 12/14/15 0703  12/14/15 1030  BP: (!) 133/54 122/79 116/71 120/70  Pulse: 64 75 81 80  Resp: 20 18 18 18   Temp: 98.5 F (36.9 C) 98.4 F (36.9 C) 98.4 F (36.9 C) 98.5 F (36.9 C)  TempSrc: Oral Oral Oral Oral  SpO2: 100% 100% 97% 99%  Weight:  68.5 kg (151 lb 1.9 oz)    Height:  5\' 1"  (1.549 m)      Intake/Output Summary (Last 24 hours) at 12/14/15 1458 Last data filed at 12/14/15 1000  Gross per 24 hour  Intake              240 ml  Output              500 ml  Net             -260 ml   Filed Weights   12/11/15 2040 12/12/15 2152 12/13/15 2336  Weight: 69.4 kg (153 lb) 68.5 kg (151 lb 0.2 oz) 68.5 kg (151 lb 1.9 oz)    General appearance: alert, cooperative, appears stated age and no distress Resp: clear to auscultation bilaterally Cardio: regular rate and rhythm, S1, S2 normal, no murmur, click, rub or gallop GI: soft, non-tender; bowel sounds normal; no masses,  no organomegaly Extremities: extremities normal, atraumatic, no cyanosis or edema  Lab Results:  Data Reviewed: I have personally reviewed following labs and imaging studies  CBC:  Recent Labs Lab 12/09/15 0445 12/10/15 0440 12/11/15 0624 12/12/15 0357 12/13/15 0649 12/14/15 0439  WBC 4.3 4.0 3.9* 3.4* 3.1* 3.3*  NEUTROABS 3.3  --  2.6 2.4 1.8 1.6*  HGB 8.4* 6.6* 9.6* 10.1* 10.6* 10.3*  HCT 26.0* 20.2* 28.7* 29.9* 31.4* 30.5*  MCV 92.5 89.0 88.3 87.9 88.0 86.2  PLT 176 208 162 141* 140* [id number]    Basic Metabolic Panel:  Recent Labs Lab 12/09/15 0817 12/10/15 0440 12/11/15 0624 12/12/15 0357 12/13/15 0649 12/14/15 0439  NA  --  131* 131* 130* 131* 131*  K  --  2.9* 3.5 3.1* 3.7 3.6  CL  --  102 101 98* 99* 97*  CO2  --  20* 23 24 24 24   GLUCOSE  --  76 83 81 83 81  BUN  --  7 <5* <5* 5* 5*  CREATININE  --  0.90 0.76 0.69 0.67 0.75  CALCIUM  --  7.9* 8.3* 8.4* 8.6* 8.6*  MG 1.7  --   --   --   --   --   PHOS 2.5  --   --   --   --   --     GFR: Estimated Creatinine Clearance: 72.9 mL/min (by C-G  formula based on SCr of 0.75 mg/dL).  Liver Function Tests:  Recent Labs Lab 12/09/15 0615 12/10/15 0440  AST 62* 38  ALT 37 31  ALKPHOS 84 78  BILITOT 1.3* 0.9  PROT 6.3* 6.0*  ALBUMIN 3.2* 2.9*     Recent Labs Lab 12/09/15 0615  LIPASE 26   Coagulation Profile:  Recent Labs Lab 12/10/15 1030  INR 1.10     Medications:  Scheduled: . ciprofloxacin  500 mg Oral BID  . famotidine  20 mg Oral Daily  . feeding supplement (ENSURE ENLIVE)  237 mL Oral TID BM  . gabapentin  600 mg Oral TID  . levothyroxine  25 mcg Oral QAC breakfast  . levothyroxine  300 mcg Oral QAC breakfast  . metoCLOPramide  10 mg Oral TID AC & HS  . metroNIDAZOLE  500 mg Oral Q8H  . ondansetron  4 mg Oral Q8H  . promethazine  25 mg Intravenous Q8H   Continuous:  [id number] **OR** acetaminophen, diphenhydrAMINE, oxyCODONE-acetaminophen, sodium chloride flush    Principal Problem:   Symptomatic anemia Active Problems:   Hypothyroidism   Transaminitis   Chronic abdominal and back  pain   Rheumatoid arthritis (HCC)   Acute hyponatremia   Hematemesis   Chronic hoarseness   Unintended weight loss   Protein-calorie malnutrition, severe     LOS: 4 days   [place] [facility] Pager [phone] 12/14/2015, 2:58 PM  If 7PM-7AM, please contact night-coverage at [email or web address], password [facility]

## 2015-12-14 NOTE — Clinical Social Work Placement (Signed)
   CLINICAL SOCIAL WORK PLACEMENT  NOTE 12/14/15 - DISCHARGED TO [facility] VIA [facility]  Date:  12/14/2015  Patient Details  Name: Adrienne Flores MRN: [id number] Date of Birth: 1965-11-04  Clinical Social Work is seeking post-discharge placement for this patient at the [place] level of care (*CSW will initial, date and re-position this form in  chart as items are completed):  Yes   Patient/family provided with [place] Work Department's list of facilities offering this level of care within the geographic area requested by the patient (or if unable, by the patient's family).  Yes   Patient/family informed of their freedom to choose among providers that offer the needed level of care, that participate in Medicare, Medicaid or managed care program needed by the patient, have an available bed and are willing to accept the patient.  Yes   Patient/family informed of [place]'s ownership interest in [facility] and [facility], as well as of the fact that they are under no obligation to receive care at these facilities.  PASRR submitted to EDS on 12/13/15     PASRR number received on 12/13/15     Existing PASRR number confirmed on       FL2 transmitted to all facilities in geographic area requested by pt/family on 12/13/15     FL2 transmitted to all facilities within larger geographic area on       Patient informed that his/her managed care company has contracts with or will negotiate with certain facilities, including the following:        Yes   Patient/family informed of bed offers received.  Patient [facility] bed at [facility]     Physician recommends and patient chooses bed at      Patient to be transferred to [facility] on 12/14/15.  Patient to be transferred to facility by ambulance Corey Harold)     Patient family notified on 12/14/15 of transfer.  Name of family member notified:  Sheralee Metzen (Spouse), by phone on 12/14/15.      PHYSICIAN       Additional Comment:    _______________________________________________ Sable Feil, LCSW 12/14/2015, 3:18 PM

## 2015-12-14 NOTE — Care Management Important Message (Signed)
Important Message  Patient Details  Name: Adrienne Flores MRN: [id number] Date of Birth: 25-Aug-1965   Medicare Important Message Given:  Yes    Orbie Pyo 12/14/2015, 10:37 AM

## 2015-12-20 ENCOUNTER — Emergency Department: Payer: Medicare Other

## 2015-12-20 ENCOUNTER — Emergency Department
Admission: EM | Admit: 2015-12-20 | Discharge: 2015-12-20 | Disposition: A | Discharge status: Home / Self Care | Payer: Medicare Other | Attending: Emergency Medicine | Admitting: Emergency Medicine

## 2015-12-20 ENCOUNTER — Encounter: Payer: Self-pay | Admitting: Emergency Medicine

## 2015-12-20 DIAGNOSIS — G8929 Other chronic pain: Secondary | ICD-10-CM | POA: Diagnosis not present

## 2015-12-20 DIAGNOSIS — Z79899 Other long term (current) drug therapy: Secondary | ICD-10-CM | POA: Diagnosis not present

## 2015-12-20 DIAGNOSIS — E876 Hypokalemia: Secondary | ICD-10-CM | POA: Diagnosis not present

## 2015-12-20 DIAGNOSIS — R112 Nausea with vomiting, unspecified: Secondary | ICD-10-CM | POA: Diagnosis present

## 2015-12-20 DIAGNOSIS — R1084 Generalized abdominal pain: Secondary | ICD-10-CM | POA: Diagnosis not present

## 2015-12-20 DIAGNOSIS — K9289 Other specified diseases of the digestive system: Secondary | ICD-10-CM | POA: Insufficient documentation

## 2015-12-20 DIAGNOSIS — E039 Hypothyroidism, unspecified: Secondary | ICD-10-CM | POA: Diagnosis not present

## 2015-12-20 DIAGNOSIS — R109 Unspecified abdominal pain: Secondary | ICD-10-CM

## 2015-12-20 LAB — HEPATIC FUNCTION PANEL
ALBUMIN: 4.1 g/dL (ref 3.5–5.0)
ALT: 9 U/L — ABNORMAL LOW (ref 14–54)
AST: 18 U/L (ref 15–41)
Alkaline Phosphatase: 63 U/L (ref 38–126)
BILIRUBIN TOTAL: 1.4 mg/dL — AB (ref 0.3–1.2)
Bilirubin, Direct: 0.2 mg/dL (ref 0.1–0.5)
Indirect Bilirubin: 1.2 mg/dL — ABNORMAL HIGH (ref 0.3–0.9)
Total Protein: 7.8 g/dL (ref 6.5–8.1)

## 2015-12-20 LAB — CBC WITH DIFFERENTIAL/PLATELET
Basophils Absolute: 0 10*3/uL (ref 0–0.1)
Basophils Relative: 1 %
EOS ABS: 0 10*3/uL (ref 0–0.7)
EOS PCT: 2 %
HCT: 32.5 % — ABNORMAL LOW (ref 35.0–47.0)
HEMOGLOBIN: 11.2 g/dL — AB (ref 12.0–16.0)
LYMPHS ABS: 0.8 10*3/uL — AB (ref 1.0–3.6)
Lymphocytes Relative: 31 %
MCH: 29.9 pg (ref 26.0–34.0)
MCHC: 34.5 g/dL (ref 32.0–36.0)
MCV: 86.7 fL (ref 80.0–100.0)
MONO ABS: 0.3 10*3/uL (ref 0.2–0.9)
MONOS PCT: 12 %
Neutro Abs: 1.5 10*3/uL (ref 1.4–6.5)
Neutrophils Relative %: 54 %
PLATELETS: 157 10*3/uL (ref 150–440)
RBC: 3.75 MIL/uL — ABNORMAL LOW (ref 3.80–5.20)
RDW: 15 % — AB (ref 11.5–14.5)
WBC: 2.7 10*3/uL — ABNORMAL LOW (ref 3.6–11.0)

## 2015-12-20 LAB — C-REACTIVE PROTEIN

## 2015-12-20 LAB — RETICULOCYTES
RBC.: 3.71 MIL/uL — ABNORMAL LOW (ref 3.80–5.20)
Retic Count, Absolute: 77.9 10*3/uL (ref 19.0–183.0)
Retic Ct Pct: 2.1 % (ref 0.4–3.1)

## 2015-12-20 LAB — BASIC METABOLIC PANEL
Anion gap: 12 (ref 5–15)
BUN: 12 mg/dL (ref 6–20)
CHLORIDE: 95 mmol/L — AB (ref 101–111)
CO2: 24 mmol/L (ref 22–32)
CREATININE: 0.57 mg/dL (ref 0.44–1.00)
Calcium: 9 mg/dL (ref 8.9–10.3)
GFR calc Af Amer: >60 mL/min (ref >60)
GFR calc non Af Amer: >60 mL/min (ref >60)
Glucose, Bld: 85 mg/dL (ref 65–99)
Potassium: 2.8 mmol/L — ABNORMAL LOW (ref 3.5–5.1)
Sodium: 131 mmol/L — ABNORMAL LOW (ref 135–145)

## 2015-12-20 LAB — LIPASE, BLOOD: Lipase: 23 U/L (ref 11–51)

## 2015-12-20 LAB — SEDIMENTATION RATE: SED RATE: 45 mm/h — AB (ref 0–30)

## 2015-12-20 MED ORDER — POTASSIUM CHLORIDE CRYS ER 20 MEQ PO TBCR
40.0000 meq | EXTENDED_RELEASE_TABLET | Freq: Once | ORAL | Status: DC
Start: 2015-12-20 — End: 2015-12-21
  Filled 2015-12-20: qty 2

## 2015-12-20 MED ORDER — PROMETHAZINE HCL 25 MG RE SUPP: 25.0000 mg | Freq: Four times a day (QID) | RECTAL | 1 refills | Status: DC | PRN

## 2015-12-20 MED ORDER — SODIUM CHLORIDE 0.9 % IV BOLUS (SEPSIS)
1000.0000 mL | INTRAVENOUS | Status: AC
  Administered 2015-12-20: 1000 mL via INTRAVENOUS

## 2015-12-20 MED ORDER — HALOPERIDOL LACTATE 5 MG/ML IJ SOLN
5.0000 mg | Freq: Once | INTRAMUSCULAR | Status: AC
  Administered 2015-12-20: 5 mg via INTRAVENOUS
  Filled 2015-12-20: qty 1

## 2015-12-20 MED ORDER — DIPHENHYDRAMINE HCL 50 MG/ML IJ SOLN
25.0000 mg | Freq: Once | INTRAMUSCULAR | Status: AC
  Administered 2015-12-20: 25 mg via INTRAVENOUS
  Filled 2015-12-20: qty 1

## 2015-12-20 MED ORDER — POTASSIUM CHLORIDE CRYS ER 20 MEQ PO TBCR: 20.0000 meq | EXTENDED_RELEASE_TABLET | Freq: Two times a day (BID) | ORAL | 0 refills | Status: DC

## 2015-12-20 NOTE — ED Notes (Signed)
ED Provider at bedside. 

## 2015-12-20 NOTE — ED Notes (Signed)
Discharge instructions reviewed with patient. Questions fielded by this RN. Patient verbalizes understanding of instructions. Patient discharged home in stable condition per [place] MD . No acute distress noted at time of discharge.   

## 2015-12-20 NOTE — ED Notes (Addendum)
Dr. Karma Greaser aware that patient would not attempt to take PO potassium. Verbalized understanding.  Pt has had no events of emesis since arrival but will not try anything PO.

## 2015-12-20 NOTE — ED Provider Notes (Signed)
[facility] Emergency Department Provider Note  ____________________________________________   First MD Initiated Contact with Patient 12/20/15 1711     (approximate)  I have reviewed the triage vital signs and the nursing notes.   HISTORY  Chief Complaint No chief complaint on file.    HPI Adrienne Flores is a 50 y.o. female with extensive chronic abdominal pain and chronic nausea and vomiting history.  She presents for evaluation of the same today.  She was sent over from her GI clinic about 15 minutes ago by the nurse practitioner.  The nurse practitioner spoke with Myriam Jacobson our charge nurse and explained that the patient "just feels poopy" although she was not able to identify specifically what was new or different.  They obtain lab work earlier today and the patient's labs were notable for slightly increased hypokalemia but were otherwise essentially unchanged from prior.  Her hemoglobin is higher than it was previously when she was infused 2 units of blood.  She had an endoscopy about a week ago which was unremarkable.  She has had intermittent episodes of colitis in the past.  Today she complains of persistent episodes of vomiting and difficulty tolerating by mouth intake over the last week.  She reports that the pain in her abdomen is generalized, waxing and waning but generally severe, nothing makes better and nothing makes worse.  Her vomiting is severe as well and by mouth intake seems to make it worse.  She states that Dilaudid typically helps.  She reports that she continues to take her Reglan and Bentyl as an outpatient.  She also goes to a pain management doctor.  She reports that she has been having these problems for years and has had 100s of pounds of unintentional weight loss.  She reports that Dr. Frazier Richards, her PCP, has even talked about whether or not she needs a feeding tube.  Has been worse over the last few months..    Past Medical  History:  Diagnosis Date  . Chronic abdominal pain 2008   dates back to at least 2008.  multiple ultrasound, CT scans, x rays at [facility], [place].  [facility] colonoscopy, egd unrevealing.    . Colitis 2015   ? colitis per CTs in 2015, 2016 and 11/2015.  presumed infectious.  colonoscopy in 2015 negative for colitis.   . Cyclic vomiting syndrome 2015   per opinion  of Dr Granville Lewis, IBS specialist at [facility].   . Degenerative disc disease   . Depression   . Fibromyalgia   . Gastroparesis 03/2011   markedly abnormal gastric emptying study.   . Hematemesis 11/2015   mild to moderate blood in emesis appeared after 2 plus weeks of frequent, non-bloody emesis.   [name] Hemorrhoids   . History of chicken pox   . Hypokalemia   . Hypothyroid   . Lupus   . 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 Rheumatoid arthritis [facility])     Patient Active Problem List   Diagnosis Date Noted  . Protein-calorie malnutrition, severe 12/12/2015  . Symptomatic anemia 12/09/2015  . Acute hyponatremia 12/09/2015  . Hematemesis 12/09/2015  . Chronic hoarseness 12/09/2015  . Unintended weight loss 12/09/2015  . 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 DDD (degenerative disc disease), cervical 06/16/2014  . 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 DDD (degenerative disc disease), lumbosacral 06/16/2014  . Chronic abdominal and back  pain 07/26/2013  . Nausea  and vomiting 07/26/2013  . 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 Rheumatoid arthritis ([place]) 07/26/2013  . UTI (lower urinary tract infection) 07/26/2013  . Uncontrolled pain 07/26/2013  . Acute on chronic renal failure ([place]) 06/27/2013  . Colitis, infectious 06/25/2013  . AKI (acute kidney injury) ([place]) 06/25/2013  . Transaminitis 06/25/2013  . Infectious colitis 06/25/2013  . Abdominal pain 11/26/2012  . Hypothyroidism 11/26/2012    Past Surgical History:  Procedure Laterality Date  . ABDOMINAL HYSTERECTOMY    . CESAREAN  SECTION     x2  . CHOLECYSTECTOMY    . COLONOSCOPY  11/2013   at [facility] Dr Kennith Gain. Hemorrhoids. sigmoid tics. random biopsies negative for microscopic colitis or any pathology.   . ESOPHAGOGASTRODUODENOSCOPY  11/2013   Dr Kennith Gain at [facility].  Normal study.  Duodenal bx negative for villous atrophy.   . ESOPHAGOGASTRODUODENOSCOPY N/A 12/09/2015   Procedure: ESOPHAGOGASTRODUODENOSCOPY (EGD);  Surgeon: Doran Stabler, MD;  Location: [facility] ENDOSCOPY;  Service: Endoscopy;  Laterality: N/A;  . KNEE SURGERY Left   . OOPHORECTOMY    . REPLACEMENT TOTAL KNEE Bilateral     Prior to Admission medications   Medication Sig Start Date End Date Taking? Authorizing Provider  amitriptyline (ELAVIL) 75 MG tablet Take 75 mg by mouth at bedtime.    Historical Provider, MD  dicyclomine (BENTYL) 20 MG tablet Take 1 tablet (20 mg total) by mouth 2 (two) times daily. 10/22/14   Gareth Morgan, MD  gabapentin (NEURONTIN) 600 MG tablet Take 1 tablet (600 mg total) by mouth 3 (three) times daily. 12/14/15   Bonnielee Haff, MD  levothyroxine (SYNTHROID, LEVOTHROID) 25 MCG tablet Take 25 mcg by mouth daily before breakfast. Take with 362mcg to = 370mcg total    Historical Provider, MD  levothyroxine (SYNTHROID, LEVOTHROID) 300 MCG tablet Take 300 mcg by mouth daily before breakfast. Take with a 35mcg tablet to = 38mcg    Historical Provider, MD  meclizine (ANTIVERT) 25 MG tablet Take 1 tablet (25 mg total) by mouth 3 (three) times daily as needed. Patient taking differently: Take 25 mg by mouth 3 (three) times daily as needed for dizziness or nausea.  09/11/13   Dalia Heading, PA-C  metoCLOPramide (REGLAN) 10 MG tablet Take 1 tablet (10 mg total) by mouth 4 (four) times daily. 05/03/13   Carlisle Cater, PA-C  ondansetron (ZOFRAN ODT) 4 MG disintegrating tablet Take 1 tablet (4 mg total) by mouth every 8 (eight) hours as needed for nausea or vomiting. 10/22/14   Gareth Morgan, MD  oxyCODONE-acetaminophen (PERCOCET/ROXICET)  5-325 MG tablet Take 1 tablet by mouth every 6 (six) hours as needed. 12/14/15   Bonnielee Haff, MD  potassium chloride SA (KLOR-CON M20) 20 MEQ tablet Take 1 tablet (20 mEq total) by mouth 2 (two) times daily. 12/20/15   Hinda Kehr, MD  promethazine (PHENERGAN) 12.5 MG tablet Take 1 tablet (12.5 mg total) by mouth every 6 (six) hours as needed for nausea or vomiting. 12/13/15   Jennifer Chahn-Yang Choi, DO  promethazine (PHENERGAN) 25 MG suppository Place 1 suppository (25 mg total) rectally every 6 (six) hours as needed for nausea. Do not use these if you are also using the oral Phenergan tablets you were previously prescribed. 12/20/15 12/19/16  Hinda Kehr, MD    Allergies Methotrexate derivatives; Morphine and related; and Relafen [nabumetone]  Family History  Problem Relation Age of Onset  . Leukemia Mother   . Arthritis Mother   . Early death Mother   . Miscarriages / [place]  Mother     Social History Social History  Substance Use Topics  . Smoking status: Never Smoker  . Smokeless tobacco: Never Used  . Alcohol use Yes     Comment: social    Review of Systems Constitutional: No fever/chills Eyes: No visual changes. ENT: No sore throat. Cardiovascular: Denies chest pain. Respiratory: Denies shortness of breath. Gastrointestinal: +abdominal pain.  +N/V.  Intermittent diarrhea. Genitourinary: Negative for dysuria. Musculoskeletal: Negative for back pain. Skin: Negative for rash. Neurological: Negative for headaches, focal weakness or numbness.  10-point ROS otherwise negative.  ____________________________________________   PHYSICAL EXAM:  VITAL SIGNS: ED Triage Vitals  Enc Vitals Group     BP 12/20/15 1648 107/78     Pulse Rate 12/20/15 1648 (!) 108     Resp 12/20/15 1648 18     Temp 12/20/15 1648 97.8 F (36.6 C)     Temp Source 12/20/15 1648 Oral     SpO2 12/20/15 1648 100 %     Weight 12/20/15 1645 133 lb (60.3 kg)     Height 12/20/15 1645 5\' 1"   (1.549 m)     Head Circumference --      Peak Flow --      Pain Score 12/20/15 1645 10     Pain Loc --      Pain Edu? --      Excl. in [place]? --     Constitutional: Alert and oriented. No acute distress.  Non-toxic, does not appear uncomfortable at rest. Eyes: Conjunctivae are normal. PERRL. EOMI. Head: Atraumatic. Nose: No congestion/rhinnorhea. Mouth/Throat: Mucous membranes are moist, lips are not dry nor cracked.  Oropharynx non-erythematous. Neck: No stridor.  No meningeal signs.   Cardiovascular: Borderline tachycardia. Good peripheral circulation. Grossly normal heart sounds. Respiratory: Normal respiratory effort.  No retractions. Lungs CTAB. Gastrointestinal: Soft and nontender. No distention.  Musculoskeletal: No lower extremity tenderness nor edema. No gross deformities of extremities. Neurologic:  Normal speech and language. No gross focal neurologic deficits are appreciated.  Skin:  Skin is warm, dry and intact. No rash noted. Psychiatric: Mood and affect are depressed . Labile emotions, occasionally tearful, became angry when we discussed narcotics.  ____________________________________________   LABS (all labs ordered are listed, but only abnormal results are displayed)  Labs Reviewed  HEPATIC FUNCTION PANEL - Abnormal; Notable for the following:       Result Value   ALT 9 (*)    Total Bilirubin 1.4 (*)    Indirect Bilirubin 1.2 (*)    All other components within normal limits  LIPASE, BLOOD   ____________________________________________  EKG  No new EKG, but review of prior ECGs shows no history of QT prolongation. ____________________________________________  [id number]   Dg Abdomen Acute W/chest  Result Date: 12/20/2015 CLINICAL DATA:  Acute on chronic abdominal pain with nausea and vomiting. EXAM: DG ABDOMEN ACUTE W/ 1V CHEST COMPARISON:  Chest radiographs 08/05/2015. Abdominal radiographs and CT abdomen and pelvis 12/09/2015. FINDINGS: Right jugular  Port-A-Cath remains in place with tip overlying the upper SVC. Cardiomediastinal silhouette is within normal limits. The lungs are clear. No pleural effusion or pneumothorax is seen. No acute osseous abnormality is identified. There is no evidence of intraperitoneal free air. There is mild diffuse gaseous distension of the colon aside from a segment of decompressed descending colon containing a small amount of stool. No dilated loops of small bowel are seen. Surgical clips are present in the right upper and left lower quadrants. IMPRESSION: 1. Mild gaseous distension of the  colon. No evidence of significant obstruction. 2. No evidence of acute cardiopulmonary process. Electronically Signed   By: Logan Bores M.D.   On: 12/20/2015 18:28    ____________________________________________   PROCEDURES  Procedure(s) performed:   Procedures   Critical Care performed: No ____________________________________________   INITIAL IMPRESSION / ASSESSMENT AND PLAN / ED COURSE  Pertinent labs & imaging results that were available during my care of the patient were reviewed by me and considered in my medical decision making (see chart for details).  The patient is a very unfortunate woman with severe chronic medical problems and a great deal of chronic pain.  However, based on her labs that were obtained earlier today as an outpatient, she does not have any acute medical problem today.  Her persistent vomiting has been well documented in the past and I reviewed did review her other notes including her recent hospitalization.  She has these episodes in cycles where her symptoms are better and worse at times, but she appears overall to be in a better state of health and she was during her admission at [facility] over the last couple of weeks.  I discussed with her the reassuring results in terms of her kidney function, her hemoglobin, her electrolytes other than a slight dip in her potassium, and even her vital signs  being essentially normal except for the very borderline tachycardia which is actually improved down into the 90s when I was talking with her.  I told her that I will definitely try to make her feel better by controlling the persistent vomiting and abdominal discomfort but that we would avoid narcotic medications due to the chronic nature of the issue.  She became angry at this and told me that that is what she is always given and I explained that it is against [facility] policy to provide narcotics for chronic pain.  She pointed out that she was given fentanyl and Dilaudid when she was in [place] just recently, but I also acknowledged that this was done while they are in the process of ruling out an acute or emergent medical condition which she does not seem to have at this time.  I assured her I am happy to work with her on symptom control with non-opioid medications.  I will start with Haldol 5 mg IV to help with her intractable nausea and vomiting as well as 25 mg of IV Benadryl to avoid side effects from the Haldol.  I am giving her 1 L of fluids given her recent episodes of vomiting in spite of no clinical evidence of dehydration and we are going to provide potassium chloride 40 mEq by mouth.  It is my hope that we will be able to make her feel better she can return to her rehabilitation/SNF facility for additional outpatient follow up.  I understand that it was apparently the nurse practitioner's concerned that the patient needed admission, but as I explained to the patient, there are no admittable diagnoses at this time and that we will be better off treating her symptoms and returning her to her facility.   Clinical Course as of Dec 19 2005  Tue Dec 20, 2015  1819 I spoke by phone with the on-call gastroenterologist, Dr. Vicente Males.  Although he is not in the same clinic as the nurse practitioner who saw the patient earlier, I discussed the case in detail with him and explain both her clinical presentation  as well as her lab results.  He agreed with  me that it does not sound as if she needs admission at this time and he also agreed that avoiding narcotics and functional abdominal pain is preferable; he states that he never gets narcotics and these kinds of situations.  I have ordered an acute abdomen series and waiting for the results and we are also awaiting the results of the hepatic function panel and lipase which I added on because they were not obtained this morning by the GI clinic.  I anticipate discharge with outpatient follow-up.  The patient has not had any episodes of vomiting since coming to the emergency department.  [CF]  1829 Patient is refusing to take the oral potassium and instead requesting a feeding tube.  I will provide her with the name and number of a surgeon with whom she can follow up.  I will also provide a prescription for rectal Phenergan and potassium supplements.  [CF]  2006 I had a conversation again with the patient prior to her discharge with the patient's nurse present in the room.  I explained how strong labs believe she should take the potassium and she agreed to try to do so, but wants to wait until she gets back to [place].  I encouraged her to do her best to stay hydrated and to follow-up with her regular doctors at the next available opportunity.  [CF]    Clinical Course User Index [CF] Hinda Kehr, MD    ____________________________________________  FINAL CLINICAL IMPRESSION(S) / ED DIAGNOSES  Final diagnoses:  Chronic abdominal pain  Nausea and vomiting, intractability of vomiting not specified, unspecified vomiting type  Hypokalemia, gastrointestinal losses     MEDICATIONS GIVEN DURING THIS VISIT:  Medications  potassium chloride SA (K-DUR,KLOR-CON) CR tablet 40 mEq (40 mEq Oral Refused 12/20/15 1742)  sodium chloride 0.9 % bolus 1,000 mL (1,000 mLs Intravenous New Bag/Given 12/20/15 1742)  haloperidol lactate (HALDOL) injection 5 mg (5 mg Intravenous  Given 12/20/15 1742)  diphenhydrAMINE (BENADRYL) injection 25 mg (25 mg Intravenous Given 12/20/15 1742)     NEW OUTPATIENT MEDICATIONS STARTED DURING THIS VISIT:  New Prescriptions   POTASSIUM CHLORIDE SA (KLOR-CON M20) 20 MEQ TABLET    Take 1 tablet (20 mEq total) by mouth 2 (two) times daily.   PROMETHAZINE (PHENERGAN) 25 MG SUPPOSITORY    Place 1 suppository (25 mg total) rectally every 6 (six) hours as needed for nausea. Do not use these if you are also using the oral Phenergan tablets you were previously prescribed.    Modified Medications   No medications on file    Discontinued Medications   No medications on file     Note:  This document was prepared using Dragon voice recognition software and may include unintentional dictation errors.    Hinda Kehr, MD 12/20/15 2007

## 2015-12-20 NOTE — ED Triage Notes (Addendum)
Seen by GI today.  States bloodwork from this morning was abnormal and is here for admission.  States has chronic nausea/ vomiting x 10 days.  Seen through [facility] and transfused 2 units of blood.  Currently at [facility] for rehabilitation.

## 2015-12-20 NOTE — ED Notes (Signed)
Pt states that she cannot eat or drink anything without emesis. Refusing to attempt to take PO potassium. Pt requesting "feeding tube" placement.

## 2015-12-20 NOTE — ED Notes (Signed)
Patient transported to X-ray 

## 2015-12-20 NOTE — ED Notes (Signed)
Pt requesting that port be accessed vs PIV insertion. States she has had port x 2 oyear and had it placed due to being a "hard stick".

## 2015-12-20 NOTE — Discharge Instructions (Signed)
You have been seen in the Emergency Department (ED) for abdominal pain.  Your evaluation did not identify a clear cause of your symptoms but was generally reassuring.  We understand you have been suffering from these symptoms for years, and we hope your outpatient specialists will be able to further assist you.  We encourage you to take the prescribed potassium supplements since your potassium is a bit low, but as we discussed, there is no indication for hospital admission today.  Please follow up as instructed above regarding today?s emergent visit and the symptoms that are bothering you.  As we discussed, we cannot provide narcotics for chronic pain according to the [facility] Chronic Pain policy.  Return to the ED if your abdominal pain worsens or fails to improve, you develop bloody vomiting, bloody diarrhea, you are unable to tolerate fluids due to vomiting, fever greater than 101, or other symptoms that concern you.

## 2015-12-20 NOTE — ED Notes (Addendum)
Pt states that she was sent here after her GI appt and bloodwork because she is "dehydrated", "everything is low". Pt hx of chronic abdominal pain. Pt alert and oriented X4, active, cooperative, pt in NAD. RR even and unlabored, color WNL.  Pt denies any new pain, c/o chronic pain of the abdomen.

## 2015-12-20 NOTE — ED Notes (Signed)
Into patient room to establish IV and administer ordered meds. Pt female visitor at bedside. Pt making comment to female visitor, "he is one of those that is on Dr. Jimmye Norman bandwagon" in reference to [place], Karma Greaser. Pt states that she is upset that EDP will not order her pain medications for her abdominal pain. Pt then making comments about nurses in hospital not liking Dr. Jimmye Norman because he will not prescribe pain medications to patients in ER.

## 2015-12-20 NOTE — ED Notes (Signed)
Offered again for patient to take PO potassium, pt states that she does not want to try. Will take her prescribed PO potassium when she gets back to facility and has it filled. Pt resides at [facility] currently. Female visitor at bedside for this conversation.

## 2015-12-22 LAB — CBC WITH DIFFERENTIAL/PLATELET
Basophils Absolute: 0 10*3/uL (ref 0–0.1)
Basophils Relative: 1 %
EOS PCT: 1 %
Eosinophils Absolute: 0 10*3/uL (ref 0–0.7)
HEMATOCRIT: 32.9 % — AB (ref 35.0–47.0)
Hemoglobin: 11.2 g/dL — ABNORMAL LOW (ref 12.0–16.0)
LYMPHS ABS: 0.6 10*3/uL — AB (ref 1.0–3.6)
LYMPHS PCT: 15 %
MCH: 29.9 pg (ref 26.0–34.0)
MCHC: 33.9 g/dL (ref 32.0–36.0)
MCV: 88.3 fL (ref 80.0–100.0)
MONO ABS: 0.3 10*3/uL (ref 0.2–0.9)
Monocytes Relative: 7 %
Neutro Abs: 3.3 10*3/uL (ref 1.4–6.5)
Neutrophils Relative %: 76 %
PLATELETS: 214 10*3/uL (ref 150–440)
RBC: 3.72 MIL/uL — ABNORMAL LOW (ref 3.80–5.20)
RDW: 14.8 % — AB (ref 11.5–14.5)
WBC: 4.2 10*3/uL (ref 3.6–11.0)

## 2015-12-22 LAB — COMPREHENSIVE METABOLIC PANEL
ALT: 6 U/L — AB (ref 14–54)
AST: 13 U/L — ABNORMAL LOW (ref 15–41)
Albumin: 4.2 g/dL (ref 3.5–5.0)
Alkaline Phosphatase: 66 U/L (ref 38–126)
Anion gap: 12 (ref 5–15)
BILIRUBIN TOTAL: 0.8 mg/dL (ref 0.3–1.2)
BUN: 11 mg/dL (ref 6–20)
CHLORIDE: 95 mmol/L — AB (ref 101–111)
CO2: 25 mmol/L (ref 22–32)
CREATININE: 0.64 mg/dL (ref 0.44–1.00)
Calcium: 9.5 mg/dL (ref 8.9–10.3)
Glucose, Bld: 81 mg/dL (ref 65–99)
POTASSIUM: 3.1 mmol/L — AB (ref 3.5–5.1)
Sodium: 132 mmol/L — ABNORMAL LOW (ref 135–145)
TOTAL PROTEIN: 8.1 g/dL (ref 6.5–8.1)

## 2015-12-25 ENCOUNTER — Emergency Department
Admission: EM | Admit: 2015-12-25 | Discharge: 2015-12-25 | Disposition: A | Discharge status: Home / Self Care | Payer: Medicare Other | Attending: Emergency Medicine | Admitting: Emergency Medicine

## 2015-12-25 DIAGNOSIS — R112 Nausea with vomiting, unspecified: Secondary | ICD-10-CM | POA: Insufficient documentation

## 2015-12-25 DIAGNOSIS — R1084 Generalized abdominal pain: Secondary | ICD-10-CM | POA: Insufficient documentation

## 2015-12-25 DIAGNOSIS — E039 Hypothyroidism, unspecified: Secondary | ICD-10-CM | POA: Diagnosis not present

## 2015-12-25 DIAGNOSIS — Z79899 Other long term (current) drug therapy: Secondary | ICD-10-CM | POA: Diagnosis not present

## 2015-12-25 DIAGNOSIS — G8929 Other chronic pain: Secondary | ICD-10-CM | POA: Diagnosis not present

## 2015-12-25 DIAGNOSIS — R109 Unspecified abdominal pain: Secondary | ICD-10-CM | POA: Diagnosis present

## 2015-12-25 LAB — COMPREHENSIVE METABOLIC PANEL
ALT: 8 U/L — ABNORMAL LOW (ref 14–54)
ANION GAP: 10 (ref 5–15)
AST: 19 U/L (ref 15–41)
Albumin: 3.7 g/dL (ref 3.5–5.0)
Alkaline Phosphatase: 58 U/L (ref 38–126)
BUN: 9 mg/dL (ref 6–20)
CHLORIDE: 99 mmol/L — AB (ref 101–111)
CO2: 22 mmol/L (ref 22–32)
CREATININE: 0.45 mg/dL (ref 0.44–1.00)
Calcium: 8.9 mg/dL (ref 8.9–10.3)
Glucose, Bld: 94 mg/dL (ref 65–99)
POTASSIUM: 2.9 mmol/L — AB (ref 3.5–5.1)
SODIUM: 131 mmol/L — AB (ref 135–145)
Total Bilirubin: 1 mg/dL (ref 0.3–1.2)
Total Protein: 7.2 g/dL (ref 6.5–8.1)

## 2015-12-25 LAB — CBC WITH DIFFERENTIAL/PLATELET
BASOS ABS: 0 10*3/uL (ref 0–0.1)
BASOS PCT: 1 %
EOS PCT: 0 %
Eosinophils Absolute: 0 10*3/uL (ref 0–0.7)
HCT: 29.3 % — ABNORMAL LOW (ref 35.0–47.0)
Hemoglobin: 9.9 g/dL — ABNORMAL LOW (ref 12.0–16.0)
LYMPHS PCT: 15 %
Lymphs Abs: 0.6 10*3/uL — ABNORMAL LOW (ref 1.0–3.6)
MCH: 29.4 pg (ref 26.0–34.0)
MCHC: 33.9 g/dL (ref 32.0–36.0)
MCV: 86.7 fL (ref 80.0–100.0)
Monocytes Absolute: 0.2 10*3/uL (ref 0.2–0.9)
Monocytes Relative: 7 %
NEUTROS ABS: 2.9 10*3/uL (ref 1.4–6.5)
Neutrophils Relative %: 77 %
PLATELETS: 270 10*3/uL (ref 150–440)
RBC: 3.38 MIL/uL — AB (ref 3.80–5.20)
RDW: 14.5 % (ref 11.5–14.5)
WBC: 3.7 10*3/uL (ref 3.6–11.0)

## 2015-12-25 LAB — LIPASE, BLOOD: LIPASE: 24 U/L (ref 11–51)

## 2015-12-25 MED ORDER — POTASSIUM CHLORIDE CRYS ER 20 MEQ PO TBCR
40.0000 meq | EXTENDED_RELEASE_TABLET | Freq: Once | ORAL | Status: DC
  Filled 2015-12-25: qty 2

## 2015-12-25 MED ORDER — SODIUM CHLORIDE 0.9 % IV BOLUS (SEPSIS)
1000.0000 mL | Freq: Once | INTRAVENOUS | Status: AC
  Administered 2015-12-25: 1000 mL via INTRAVENOUS

## 2015-12-25 MED ORDER — HALOPERIDOL LACTATE 5 MG/ML IJ SOLN
5.0000 mg | Freq: Once | INTRAMUSCULAR | Status: AC
  Administered 2015-12-25: 5 mg via INTRAVENOUS
  Filled 2015-12-25: qty 1

## 2015-12-25 NOTE — ED Provider Notes (Signed)
[facility] Emergency Department Provider Note    ____________________________________________   I have reviewed the triage vital signs and the nursing notes.   HISTORY  Chief Complaint Abdominal Pain   History limited by: Not Limited   HPI Adrienne Flores is a 50 y.o. female who presents from [facility] today because of concern for abdominal pain and vomiting. This is a chronic issue for the patient. She has been seen at this facility multiple times for these symptoms in the past. The patient states that she has not been able to keep down any food for the past three days and was not able to keep down her medicine today. No fevers.   Past Medical History:  Diagnosis Date  . Chronic abdominal pain 2008   dates back to at least 2008.  multiple ultrasound, CT scans, x rays at [facility], [place].  [facility] colonoscopy, egd unrevealing.    . Colitis 2015   ? colitis per CTs in 2015, 2016 and 11/2015.  presumed infectious.  colonoscopy in 2015 negative for colitis.   . Cyclic vomiting syndrome 2015   per opinion  of Dr Granville Lewis, IBS specialist at [facility]   . Degenerative disc disease   . Depression   . Fibromyalgia   . Gastroparesis 03/2011   markedly abnormal gastric emptying study.   . Hematemesis 11/2015   mild to moderate blood in emesis appeared after 2 plus weeks of frequent, non-bloody emesis.   [name] Hemorrhoids   . History of chicken pox   . Hypokalemia   . Hypothyroid   . Lupus   . 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 Rheumatoid arthritis [facility])     Patient Active Problem List   Diagnosis Date Noted  . Protein-calorie malnutrition, severe 12/12/2015  . Symptomatic anemia 12/09/2015  . Acute hyponatremia 12/09/2015  . Hematemesis 12/09/2015  . Chronic hoarseness 12/09/2015  . Unintended weight loss 12/09/2015  . 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 DDD (degenerative disc disease), cervical 06/16/2014  . DDD  (degenerative disc disease), thoracic 06/16/2014  . DDD (degenerative disc disease), lumbosacral 06/16/2014  . Chronic abdominal and back  pain 07/26/2013  . Nausea and vomiting 07/26/2013  . 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 Rheumatoid arthritis ([place]) 07/26/2013  . UTI (lower urinary tract infection) 07/26/2013  . Uncontrolled pain 07/26/2013  . Acute on chronic renal failure ([place]) 06/27/2013  . Colitis, infectious 06/25/2013  . AKI (acute kidney injury) ([place]) 06/25/2013  . Transaminitis 06/25/2013  . Infectious colitis 06/25/2013  . Abdominal pain 11/26/2012  . Hypothyroidism 11/26/2012    Past Surgical History:  Procedure Laterality Date  . ABDOMINAL HYSTERECTOMY    . CESAREAN SECTION     x2  . CHOLECYSTECTOMY    . COLONOSCOPY  11/2013   at [facility] Dr Kennith Gain. Hemorrhoids. sigmoid tics. random biopsies negative for microscopic colitis or any pathology.   . ESOPHAGOGASTRODUODENOSCOPY  11/2013   Dr Kennith Gain at [facility].  Normal study.  Duodenal bx negative for villous atrophy.   . ESOPHAGOGASTRODUODENOSCOPY N/A 12/09/2015   Procedure: ESOPHAGOGASTRODUODENOSCOPY (EGD);  Surgeon: Doran Stabler, MD;  Location: [facility] ENDOSCOPY;  Service: Endoscopy;  Laterality: N/A;  . KNEE SURGERY Left   . OOPHORECTOMY    . REPLACEMENT TOTAL KNEE Bilateral     Prior to Admission medications   Medication Sig Start Date End Date Taking? Authorizing Provider  amitriptyline (ELAVIL) 75 MG tablet Take 75 mg by mouth at  bedtime.    Historical Provider, MD  dicyclomine (BENTYL) 20 MG tablet Take 1 tablet (20 mg total) by mouth 2 (two) times daily. 10/22/14   Gareth Morgan, MD  gabapentin (NEURONTIN) 600 MG tablet Take 1 tablet (600 mg total) by mouth 3 (three) times daily. 12/14/15   Bonnielee Haff, MD  levothyroxine (SYNTHROID, LEVOTHROID) 25 MCG tablet Take 25 mcg by mouth daily before breakfast. Take with 382mcg to = 365mcg total    Historical Provider, MD  levothyroxine (SYNTHROID, LEVOTHROID) 300 MCG tablet Take  300 mcg by mouth daily before breakfast. Take with a 25mcg tablet to = 39mcg    Historical Provider, MD  meclizine (ANTIVERT) 25 MG tablet Take 1 tablet (25 mg total) by mouth 3 (three) times daily as needed. Patient taking differently: Take 25 mg by mouth 3 (three) times daily as needed for dizziness or nausea.  09/11/13   Dalia Heading, PA-C  metoCLOPramide (REGLAN) 10 MG tablet Take 1 tablet (10 mg total) by mouth 4 (four) times daily. 05/03/13   Carlisle Cater, PA-C  ondansetron (ZOFRAN ODT) 4 MG disintegrating tablet Take 1 tablet (4 mg total) by mouth every 8 (eight) hours as needed for nausea or vomiting. 10/22/14   Gareth Morgan, MD  oxyCODONE-acetaminophen (PERCOCET/ROXICET) 5-325 MG tablet Take 1 tablet by mouth every 6 (six) hours as needed. 12/14/15   Bonnielee Haff, MD  potassium chloride SA (KLOR-CON M20) 20 MEQ tablet Take 1 tablet (20 mEq total) by mouth 2 (two) times daily. 12/20/15   Hinda Kehr, MD  promethazine (PHENERGAN) 12.5 MG tablet Take 1 tablet (12.5 mg total) by mouth every 6 (six) hours as needed for nausea or vomiting. 12/13/15   Jennifer Chahn-Yang Choi, DO  promethazine (PHENERGAN) 25 MG suppository Place 1 suppository (25 mg total) rectally every 6 (six) hours as needed for nausea. Do not use these if you are also using the oral Phenergan tablets you were previously prescribed. 12/20/15 12/19/16  Hinda Kehr, MD    Allergies Methotrexate derivatives; Morphine and related; and Relafen [nabumetone]  Family History  Problem Relation Age of Onset  . Leukemia Mother   . Arthritis Mother   . Early death Mother   . Miscarriages / [place] Mother     Social History Social History  Substance Use Topics  . Smoking status: Never Smoker  . Smokeless tobacco: Never Used  . Alcohol use Yes     Comment: social    Review of Systems  Constitutional: Negative for fever. Cardiovascular: Negative for chest pain. Respiratory: Negative for shortness of  breath. Gastrointestinal: Positive for abdominal pain and nausea and vomiting. Neurological: Negative for headaches, focal weakness or numbness.  10-point ROS otherwise negative.  ____________________________________________   PHYSICAL EXAM:  Constitutional: Alert and oriented. Well appearing and in no distress. Eyes: Conjunctivae are normal. Normal extraocular movements. ENT   Head: Normocephalic and atraumatic.   Nose: No congestion/rhinnorhea.   Mouth/Throat: Mucous membranes are moist.   Neck: No stridor. Hematological/Lymphatic/Immunilogical: No cervical lymphadenopathy. Cardiovascular: Normal rate, regular rhythm.  No murmurs, rubs, or gallops. Respiratory: Normal respiratory effort without tachypnea nor retractions. Breath sounds are clear and equal bilaterally. No wheezes/rales/rhonchi. Gastrointestinal: Soft and mildly tender to palpation in the upper abdomen.  Genitourinary: Deferred Musculoskeletal: Normal range of motion in all extremities. No lower extremity edema. Neurologic:  Normal speech and language. No gross focal neurologic deficits are appreciated.  Skin:  Skin is warm, dry and intact. No rash noted. Psychiatric: Mood and affect are normal. Speech  and behavior are normal. Patient exhibits appropriate insight and judgment.  ____________________________________________    LABS (pertinent positives/negatives)  Labs Reviewed  CBC WITH DIFFERENTIAL/PLATELET - Abnormal; Notable for the following:       Result Value   RBC 3.38 (*)    Hemoglobin 9.9 (*)    HCT 29.3 (*)    Lymphs Abs 0.6 (*)    All other components within normal limits  COMPREHENSIVE METABOLIC PANEL - Abnormal; Notable for the following:    Sodium 131 (*)    Potassium 2.9 (*)    Chloride 99 (*)    ALT 8 (*)    All other components within normal limits  LIPASE, BLOOD     ____________________________________________   EKG  I, Nance Pear, attending physician,  personally viewed and interpreted this EKG  EKG Time: 1940 Rate: 95 Rhythm: normal sinus rhythm Axis: normal Intervals: qtc 463 QRS: narrow ST changes: no st elevation Impression: normal ekg   ____________________________________________    RADIOLOGY  None  ____________________________________________   PROCEDURES  Procedures  ____________________________________________   INITIAL IMPRESSION / ASSESSMENT AND PLAN / ED COURSE  Pertinent labs & imaging results that were available during my care of the patient were reviewed by me and considered in my medical decision making (see chart for details).  Patient with chronic abdominal pain and nausea who presents for the same. Was seen in the emergency department roughly 1 week ago for the same. Blood work today without any concerning leukocytosis. Potassium is again low. Will give IVFs and haldol.  Clinical Course    Patient was upset that she did not receive any dilaudid. Did discuss with patient that given chronic nature of the patient's symptoms did not feel okay with giving narcotics. At this point patient requested to go back to [facility]. Did encourage patient to stay to receive IVFs and potassium.  ____________________________________________   FINAL CLINICAL IMPRESSION(S) / ED DIAGNOSES  Final diagnoses:  Generalized abdominal pain  Nausea and vomiting, intractability of vomiting not specified, unspecified vomiting type     Note: This dictation was prepared with Dragon dictation. Any transcriptional errors that result from this process are unintentional    Nance Pear, MD 12/25/15 2052

## 2015-12-25 NOTE — ED Triage Notes (Signed)
Pt was released from [facility] a day or two ago.  Pt was then placed in [facility] for rehab.  Pt has a port accessed to give NS.  Pt also has been in discussion with doctor for a feeding tube placement.  Pt states that she has been hungry all day, but unable to keep anything down today.  Pt states she has had these issues off an on for 9 years.

## 2015-12-25 NOTE — Discharge Instructions (Signed)
Please seek medical attention for any high fevers, chest pain, shortness of breath, change in behavior, persistent vomiting, bloody stool or any other new or concerning symptoms.  

## 2015-12-25 NOTE — ED Notes (Signed)
Pt stating that if she cannot get dilaudid or morphine, then she doesn't see the point in staying.  EDP aware of this and plan to discharge back to [facility].

## 2015-12-25 NOTE — ED Notes (Signed)
Pt and husband educated that EMS will be here to pick pt up, but that there could be a delay due to incoming patients and emergencies.  Verbalized understanding.

## 2015-12-27 LAB — URINALYSIS COMPLETE WITH MICROSCOPIC (ARMC ONLY)
Bacteria, UA: NONE SEEN
Bilirubin Urine: NEGATIVE
GLUCOSE, UA: NEGATIVE mg/dL
Hgb urine dipstick: NEGATIVE
KETONES UR: NEGATIVE mg/dL
Leukocytes, UA: NEGATIVE
Nitrite: NEGATIVE
PROTEIN: NEGATIVE mg/dL
SPECIFIC GRAVITY, URINE: 1.009 (ref 1.005–1.030)
pH: 7 (ref 5.0–8.0)

## 2015-12-27 LAB — BASIC METABOLIC PANEL
Anion gap: 9 (ref 5–15)
BUN: 7 mg/dL (ref 6–20)
CO2: 23 mmol/L (ref 22–32)
CREATININE: 0.49 mg/dL (ref 0.44–1.00)
Calcium: 8.5 mg/dL — ABNORMAL LOW (ref 8.9–10.3)
Chloride: 100 mmol/L — ABNORMAL LOW (ref 101–111)
GFR calc Af Amer: >60 mL/min (ref >60)
GLUCOSE: 101 mg/dL — AB (ref 65–99)
POTASSIUM: 2.6 mmol/L — AB (ref 3.5–5.1)
SODIUM: 132 mmol/L — AB (ref 135–145)

## 2015-12-27 LAB — CBC WITH DIFFERENTIAL/PLATELET
Basophils Absolute: 0 10*3/uL (ref 0–0.1)
Basophils Relative: 1 %
EOS ABS: 0 10*3/uL (ref 0–0.7)
EOS PCT: 1 %
HCT: 24.5 % — ABNORMAL LOW (ref 35.0–47.0)
Hemoglobin: 8.4 g/dL — ABNORMAL LOW (ref 12.0–16.0)
LYMPHS ABS: 0.8 10*3/uL — AB (ref 1.0–3.6)
LYMPHS PCT: 28 %
MCH: 30 pg (ref 26.0–34.0)
MCHC: 34.3 g/dL (ref 32.0–36.0)
MCV: 87.4 fL (ref 80.0–100.0)
MONO ABS: 0.3 10*3/uL (ref 0.2–0.9)
MONOS PCT: 10 %
Neutro Abs: 1.7 10*3/uL (ref 1.4–6.5)
Neutrophils Relative %: 60 %
PLATELETS: 199 10*3/uL (ref 150–440)
RBC: 2.8 MIL/uL — ABNORMAL LOW (ref 3.80–5.20)
RDW: 15 % — AB (ref 11.5–14.5)
WBC: 2.8 10*3/uL — AB (ref 3.6–11.0)

## 2015-12-28 LAB — BASIC METABOLIC PANEL
ANION GAP: 8 (ref 5–15)
BUN: 9 mg/dL (ref 6–20)
CALCIUM: 8.9 mg/dL (ref 8.9–10.3)
CO2: 24 mmol/L (ref 22–32)
CREATININE: 0.61 mg/dL (ref 0.44–1.00)
Chloride: 100 mmol/L — ABNORMAL LOW (ref 101–111)
GFR calc Af Amer: >60 mL/min (ref >60)
GLUCOSE: 102 mg/dL — AB (ref 65–99)
Potassium: 3.2 mmol/L — ABNORMAL LOW (ref 3.5–5.1)
Sodium: 132 mmol/L — ABNORMAL LOW (ref 135–145)

## 2015-12-28 LAB — CBC WITH DIFFERENTIAL/PLATELET
BASOS ABS: 0 10*3/uL (ref 0–0.1)
Basophils Relative: 1 %
EOS PCT: 1 %
Eosinophils Absolute: 0 10*3/uL (ref 0–0.7)
HCT: 25.7 % — ABNORMAL LOW (ref 35.0–47.0)
Hemoglobin: 8.4 g/dL — ABNORMAL LOW (ref 12.0–16.0)
LYMPHS PCT: 19 %
Lymphs Abs: 0.6 10*3/uL — ABNORMAL LOW (ref 1.0–3.6)
MCH: 28.8 pg (ref 26.0–34.0)
MCHC: 32.8 g/dL (ref 32.0–36.0)
MCV: 87.6 fL (ref 80.0–100.0)
MONO ABS: 0.3 10*3/uL (ref 0.2–0.9)
MONOS PCT: 8 %
Neutro Abs: 2.4 10*3/uL (ref 1.4–6.5)
Neutrophils Relative %: 71 %
PLATELETS: 168 10*3/uL (ref 150–440)
RBC: 2.93 MIL/uL — ABNORMAL LOW (ref 3.80–5.20)
RDW: 14.3 % (ref 11.5–14.5)
WBC: 3.3 10*3/uL — ABNORMAL LOW (ref 3.6–11.0)

## 2015-12-29 LAB — URINE CULTURE: Culture: NO GROWTH

## 2016-01-04 ENCOUNTER — Inpatient Hospital Stay: Payer: Medicare Other | Admitting: Hematology and Oncology

## 2016-01-24 DIAGNOSIS — Z789 Other specified health status: Secondary | ICD-10-CM | POA: Insufficient documentation

## 2016-02-08 ENCOUNTER — Ambulatory Visit: Payer: Medicare Other

## 2016-02-09 ENCOUNTER — Other Ambulatory Visit: Payer: Self-pay | Admitting: Oncology

## 2016-02-09 ENCOUNTER — Encounter: Payer: Self-pay | Admitting: Oncology

## 2016-02-09 ENCOUNTER — Other Ambulatory Visit: Payer: Self-pay | Admitting: *Deleted

## 2016-02-09 ENCOUNTER — Inpatient Hospital Stay: Payer: Medicare Other | Admitting: *Deleted

## 2016-02-09 ENCOUNTER — Inpatient Hospital Stay: Payer: Medicare Other | Attending: Oncology | Admitting: Oncology

## 2016-02-09 ENCOUNTER — Telehealth: Payer: Self-pay | Admitting: *Deleted

## 2016-02-09 VITALS — BP 93/67 | HR 88 | Temp 97.1°F | Resp 18 | Wt 127.9 lb

## 2016-02-09 DIAGNOSIS — G8929 Other chronic pain: Secondary | ICD-10-CM | POA: Diagnosis not present

## 2016-02-09 DIAGNOSIS — E871 Hypo-osmolality and hyponatremia: Secondary | ICD-10-CM

## 2016-02-09 DIAGNOSIS — R5383 Other fatigue: Secondary | ICD-10-CM | POA: Diagnosis not present

## 2016-02-09 DIAGNOSIS — R634 Abnormal weight loss: Secondary | ICD-10-CM

## 2016-02-09 DIAGNOSIS — D638 Anemia in other chronic diseases classified elsewhere: Secondary | ICD-10-CM

## 2016-02-09 DIAGNOSIS — E43 Unspecified severe protein-calorie malnutrition: Secondary | ICD-10-CM

## 2016-02-09 DIAGNOSIS — E441 Mild protein-calorie malnutrition: Secondary | ICD-10-CM

## 2016-02-09 DIAGNOSIS — M069 Rheumatoid arthritis, unspecified: Secondary | ICD-10-CM | POA: Diagnosis not present

## 2016-02-09 DIAGNOSIS — M797 Fibromyalgia: Secondary | ICD-10-CM | POA: Diagnosis not present

## 2016-02-09 DIAGNOSIS — D649 Anemia, unspecified: Secondary | ICD-10-CM | POA: Diagnosis not present

## 2016-02-09 DIAGNOSIS — E039 Hypothyroidism, unspecified: Secondary | ICD-10-CM

## 2016-02-09 DIAGNOSIS — D72810 Lymphocytopenia: Secondary | ICD-10-CM | POA: Insufficient documentation

## 2016-02-09 DIAGNOSIS — R109 Unspecified abdominal pain: Secondary | ICD-10-CM

## 2016-02-09 DIAGNOSIS — K3184 Gastroparesis: Secondary | ICD-10-CM | POA: Insufficient documentation

## 2016-02-09 DIAGNOSIS — R946 Abnormal results of thyroid function studies: Secondary | ICD-10-CM | POA: Diagnosis not present

## 2016-02-09 DIAGNOSIS — K920 Hematemesis: Secondary | ICD-10-CM

## 2016-02-09 DIAGNOSIS — Z79899 Other long term (current) drug therapy: Secondary | ICD-10-CM | POA: Diagnosis not present

## 2016-02-09 LAB — CBC WITH DIFFERENTIAL/PLATELET
BASOS ABS: 0 10*3/uL (ref 0–0.1)
BASOS PCT: 0 %
EOS PCT: 1 %
Eosinophils Absolute: 0.1 10*3/uL (ref 0–0.7)
HCT: 35.1 % (ref 35.0–47.0)
Hemoglobin: 11.6 g/dL — ABNORMAL LOW (ref 12.0–16.0)
Lymphocytes Relative: 23 %
Lymphs Abs: 1.8 10*3/uL (ref 1.0–3.6)
MCH: 31.2 pg (ref 26.0–34.0)
MCHC: 33.2 g/dL (ref 32.0–36.0)
MCV: 94.1 fL (ref 80.0–100.0)
MONO ABS: 0.7 10*3/uL (ref 0.2–0.9)
MONOS PCT: 8 %
Neutro Abs: 5.2 10*3/uL (ref 1.4–6.5)
Neutrophils Relative %: 68 %
PLATELETS: 387 10*3/uL (ref 150–440)
RBC: 3.73 MIL/uL — ABNORMAL LOW (ref 3.80–5.20)
RDW: 18 % — AB (ref 11.5–14.5)
WBC: 7.8 10*3/uL (ref 3.6–11.0)

## 2016-02-09 LAB — VITAMIN B12: Vitamin B-12: 488 pg/mL (ref 180–914)

## 2016-02-09 LAB — TSH: TSH: 75.194 u[IU]/mL — AB (ref 0.350–4.500)

## 2016-02-09 LAB — FOLATE: FOLATE: 4.8 ng/mL — AB (ref >5.9)

## 2016-02-09 NOTE — Progress Notes (Signed)
73 lb wt loss since Jan. 2017 per patient. Reports loss of appetite. Eats two small meals a day. Pt states that foods causes gi distress. Pt reports h/o colonoscopy performed in 2015 at [facility] and colonoscopy at [facility] in 2014. Pt believes that she had an upper endoscopy at that [facility] in 2015 and again in Oct. 2017 as well.  Reports personal h/o gastroparesis/colitis.  Reports frequent nausea and vomiting.  Denies any dysphagia. Denies any hematemesis or hematochezia.   Reports personal h/o of RA. Has been tx with methotrexate in the past but was not able to tolerate due to side effects (headache). Personal h/o lupus.  Pt c/o fatigue interfering with ADLS. Pt reports that fatigue with ambulation. Unable to walk more than 5- 10 feet without stopping. Pt currently w/c.  Patient fell 6 months in which she tripped over her dog at that time.   Pt states that her mother was dx leukemia. Patient's father has had a h/o anemia. Her paternal grandfather and grandmother died of cancer- unknown type of cancer.  Family h/o of hypothyroidism. Pt has personal dx of hypothyroidism.

## 2016-02-09 NOTE — Telephone Encounter (Signed)
Nate states that I need to fax, demographics, insurance card, notes and labs and reason why pt needs to be seen. Fax to [phone].  I faxed info to their office and await call back.

## 2016-02-09 NOTE — Progress Notes (Addendum)
Hematology/Oncology Consult note [facility] [phone] [phone]  Patient Care Team: [name], MD as PCP - General (Family Medicine)   Name of the patient: Adrienne Flores  [id number]  June 11, 1965    Reason for referral- Leukopenia   Referring physician- Dr. [name]  Date of visit: 02/09/16   History of presenting illness- patient is a 50 year old female was then referred to [place] for evaluation and management of leukopenia. Patient had a normal CBC up until 11/14/2014 when her white count was 5.1, H&H was 11.7/34.5 and a platelet count of 155. Since then she has developed a gradually leukopenia and since October 2017 her white count has been between 2.7-3.9. Also she has had worsening anemia and her hemoglobin has been between 8.4-11. Her platelet counts have been more or less normal. Last CBC from 12/28/2015 showed a white count of 3.3, H&H of 8.4/25.7 and platelet count of 168. Differential on the CBC showed a normal neutrophil count with relative lymphocytopenia. BMP done one month ago showed hyponatremia with a sodium of 131, potassium of 2.9. LFTs were within normal limits. Ferritin in October 2017 was normal at 257. Folate was normal at 6.9. Haptoglobin was normal at 92. TSH in October 2017 was elevated at 37.43. T3 was low at 39. T4 was normal at 1.11. B12 was normal at 959. Patient has had multiple ER visits for abdominal pain nausea vomiting as well as constipation. She has been following up with GI at [facility] clinic. Last CT abdomen on 12/09/2015 showed mild diffuse circumferential colonic wall thickening involving the ascending and transverse colon. There was no evidence of any lymphadenopathy or malignancy. She has a history of hypothyroidism but had not been compliant with her medications in the past. Also has a history of rheumatoid arthritis. She was treated with methotrexate for the same but could not tolerate it and has been  off medications for a while. She reports no active rheumatoid arthritis symptoms and has not followed up with rheumatology in quite some time.  Currently patient reports a 40 pound weight loss over the last 1 year. She also has significant fatigue. She has had multiple ER admissions for abdominal pain. She continues to have problems with nausea and vomiting which waxes and wanes. Someday she is able to eat well but some days she says that her abdominal pain is so bad that she prefers not to eat for a few days. She has had EGD colonoscopy and multiple CT scans off her abdomen and no clear etiology of her abdominal pain has been ascertained.   ECOG PS- 2  Pain scale- 5- chronic abdominal pain   Review of systems- Review of Systems  Constitutional: Positive for malaise/fatigue and weight loss. Negative for chills and fever.  HENT: Negative for congestion, ear discharge and nosebleeds.   Eyes: Negative for blurred vision.  Respiratory: Negative for cough, hemoptysis, sputum production, shortness of breath and wheezing.   Cardiovascular: Negative for chest pain, palpitations, orthopnea and claudication.  Gastrointestinal: Positive for abdominal pain, nausea and vomiting. Negative for blood in stool, constipation, diarrhea, heartburn and melena.  Genitourinary: Negative for dysuria, flank pain, frequency, hematuria and urgency.  Musculoskeletal: Positive for joint pain (Chronic knee pain). Negative for back pain and myalgias.  Skin: Negative for rash.  Neurological: Negative for dizziness, tingling, focal weakness, seizures, weakness and headaches.  Endo/Heme/Allergies: Does not bruise/bleed easily.  Psychiatric/Behavioral: Negative for depression and suicidal ideas. The patient does not have insomnia.  Allergies  Allergen Reactions  . Methotrexate Derivatives Other (See Comments)    headaches  . Morphine And Related Hives and Itching  . Relafen [Nabumetone] Other (See Comments)     headaches    Patient Active Problem List   Diagnosis Date Noted  . Protein-calorie malnutrition, severe 12/12/2015  . Symptomatic anemia 12/09/2015  . Acute hyponatremia 12/09/2015  . Hematemesis 12/09/2015  . Chronic hoarseness 12/09/2015  . Unintended weight loss 12/09/2015  . Rheu arthrit of right ank/ft w involv of organs and systems ([place]) 06/16/2014  . DDD (degenerative disc disease), cervical 06/16/2014  . 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 DDD (degenerative disc disease), lumbosacral 06/16/2014  . Chronic abdominal and back  pain 07/26/2013  . Nausea and vomiting 07/26/2013  . 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 Rheumatoid arthritis ([place]) 07/26/2013  . UTI (lower urinary tract infection) 07/26/2013  . Uncontrolled pain 07/26/2013  . Acute on chronic renal failure ([place]) 06/27/2013  . Colitis, infectious 06/25/2013  . AKI (acute kidney injury) ([place]) 06/25/2013  . Transaminitis 06/25/2013  . Infectious colitis 06/25/2013  . Abdominal pain 11/26/2012  . Hypothyroidism 11/26/2012     Past Medical History:  Diagnosis Date  . At high risk for falls   . Chronic abdominal pain 2008   dates back to at least 2008.  multiple ultrasound, CT scans, x rays at [facility], [place].  [facility] colonoscopy, egd unrevealing.    . Colitis 2015   ? colitis per CTs in 2015, 2016 and 11/2015.  presumed infectious.  colonoscopy in 2015 negative for colitis.   . Constipation   . Cyclic vomiting syndrome 2015   per opinion  of Dr Granville Lewis, IBS specialist at [facility].   . Degenerative disc disease   . Depression   . Fibromyalgia   . Gastroparesis 03/2011   markedly abnormal gastric emptying study.   . Hematemesis 11/2015   mild to moderate blood in emesis appeared after 2 plus weeks of frequent, non-bloody emesis.   [name] Hemorrhoids   . Hemorrhoids   . History of chicken pox   . Hypokalemia   . Hypothyroid   . Lupus   . 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 Rheumatoid arthritis [facility])       Past Surgical History:  Procedure Laterality Date  . ABDOMINAL HYSTERECTOMY     total  . CESAREAN SECTION     x2  . CHOLECYSTECTOMY    . COLONOSCOPY  11/2013   at [facility] Dr Kennith Gain. Hemorrhoids. sigmoid tics. random biopsies negative for microscopic colitis or any pathology.   . ESOPHAGOGASTRODUODENOSCOPY  11/2013   Dr Kennith Gain at [facility].  Normal study.  Duodenal bx negative for villous atrophy.   . ESOPHAGOGASTRODUODENOSCOPY N/A 12/09/2015   Procedure: ESOPHAGOGASTRODUODENOSCOPY (EGD);  Surgeon: Doran Stabler, MD;  Location: [facility] ENDOSCOPY;  Service: Endoscopy;  Laterality: N/A;  . KNEE SURGERY Left   . OOPHORECTOMY    . REPLACEMENT TOTAL KNEE Bilateral     Social History   Social History  . Marital status: Married    Spouse name: N/A  . Number of children: N/A  . Years of education: N/A   Occupational History  . Not on file.   Social History Main Topics  . Smoking status: Never Smoker  . Smokeless tobacco: Never Used  . Alcohol use Yes     Comment: social  . Drug use: No  . Sexual activity: Yes   Other Topics Concern  . Not on file  Social History Narrative  . No narrative on file     Family History  Problem Relation Age of Onset  . Leukemia Mother   . Arthritis Mother   . Early death Mother   . Miscarriages / [place] Mother   . Hypothyroidism Sister   . Cancer Paternal Grandmother   . Cancer Paternal Grandfather      Current Outpatient Prescriptions:  .  levothyroxine (SYNTHROID, LEVOTHROID) 25 MCG tablet, Take 25 mcg by mouth daily before breakfast. Take with 372mg to = 3273m total, Disp: , Rfl:  .  levothyroxine (SYNTHROID, LEVOTHROID) 300 MCG tablet, Take 300 mcg by mouth daily before breakfast. Take with a 2573mtablet to = 325m65mDisp: , Rfl:  .  oxyCODONE-acetaminophen (PERCOCET/ROXICET) 5-325 MG tablet, Take 1 tablet by mouth every 6 (six) hours as needed. (Patient taking differently: Take 1 tablet by mouth daily. ), Disp: 30 tablet, Rfl:  0 .  traMADol (ULTRAM) 50 MG tablet, Take 50 mg by mouth 2 (two) times daily., Disp: , Rfl:  .  amitriptyline (ELAVIL) 75 MG tablet, Take 75 mg by mouth at bedtime., Disp: , Rfl:  .  dicyclomine (BENTYL) 20 MG tablet, Take 1 tablet (20 mg total) by mouth 2 (two) times daily. (Patient not taking: Reported on 02/09/2016), Disp: 20 tablet, Rfl: 0 .  gabapentin (NEURONTIN) 600 MG tablet, Take 1 tablet (600 mg total) by mouth 3 (three) times daily. (Patient not taking: Reported on 02/09/2016), Disp: 90 tablet, Rfl: 0 .  meclizine (ANTIVERT) 25 MG tablet, Take 1 tablet (25 mg total) by mouth 3 (three) times daily as needed. (Patient not taking: Reported on 02/09/2016), Disp: 21 tablet, Rfl: 0 .  metoCLOPramide (REGLAN) 10 MG tablet, Take 1 tablet (10 mg total) by mouth 4 (four) times daily. (Patient not taking: Reported on 02/09/2016), Disp: 12 tablet, Rfl: 0 .  ondansetron (ZOFRAN ODT) 4 MG disintegrating tablet, Take 1 tablet (4 mg total) by mouth every 8 (eight) hours as needed for nausea or vomiting. (Patient not taking: Reported on 02/09/2016), Disp: 20 tablet, Rfl: 0 .  potassium chloride SA (KLOR-CON M20) 20 MEQ tablet, Take 1 tablet (20 mEq total) by mouth 2 (two) times daily. (Patient not taking: Reported on 02/09/2016), Disp: 14 tablet, Rfl: 0 .  promethazine (PHENERGAN) 12.5 MG tablet, Take 1 tablet (12.5 mg total) by mouth every 6 (six) hours as needed for nausea or vomiting. (Patient not taking: Reported on 02/09/2016), Disp: 30 tablet, Rfl: 0 .  promethazine (PHENERGAN) 25 MG suppository, Place 1 suppository (25 mg total) rectally every 6 (six) hours as needed for nausea. Do not use these if you are also using the oral Phenergan tablets you were previously prescribed. (Patient not taking: Reported on 02/09/2016), Disp: 20 suppository, Rfl: 1   Physical exam:  Vitals:   02/09/16 1145 02/09/16 1155  BP:  93/67  Pulse:  88  Resp:  18  Temp:  97.1 F (36.2 C)  TempSrc:  Tympanic  Weight:  127 lb 13.9 oz (58 kg)    Physical Exam  Constitutional: She is oriented to person, place, and time.  She appears older than stated age and is somewhat frail. She is sitting on a wheelchair  HENT:  Head: Normocephalic and atraumatic.  Eyes: EOM are normal. Pupils are equal, round, and reactive to light.  Neck: Normal range of motion.  Cardiovascular: Normal rate, regular rhythm and normal heart sounds.   Pulmonary/Chest: Effort normal and breath sounds normal.  Abdominal: Soft. Bowel  sounds are normal.  Lymphadenopathy:  No evidence of cervical axillary or inguinal adenopathy  Neurological: She is alert and oriented to person, place, and time.  Skin: Skin is warm and dry.       CMP Latest Ref Rng & Units 12/28/2015  Glucose 65 - 99 mg/dL 102(H)  BUN 6 - 20 mg/dL 9  Creatinine 0.44 - 1.00 mg/dL 0.61  Sodium 135 - 145 mmol/L 132(L)  Potassium 3.5 - 5.1 mmol/L 3.2(L)  Chloride 101 - 111 mmol/L 100(L)  CO2 22 - 32 mmol/L 24  Calcium 8.9 - 10.3 mg/dL 8.9  Total Protein 6.5 - 8.1 g/dL -  Total Bilirubin 0.3 - 1.2 mg/dL -  Alkaline Phos 38 - 126 U/L -  AST 15 - 41 U/L -  ALT 14 - 54 U/L -   CBC Latest Ref Rng & Units 12/28/2015  WBC 3.6 - 11.0 K/uL 3.3(L)  Hemoglobin 12.0 - 16.0 g/dL 8.4(L)  Hematocrit 35.0 - 47.0 % 25.7(L)  Platelets 150 - 440 K/uL 168     Assessment and plan- Patient is a 51 y.o. female who has been referred to [place] for evaluation of leukopenia and anemia  1. Patient has had multiple medical issues ongoing including her chronic GI symptoms. Also recently her thyroid functions that were checked in October 2017 showed significant hypothyroidism. I suspect her anemia and leukopenia which is mainly lymphopenia) is secondary to her underlying chronic medical issues. However I will do a complete anemia workup including CBC, CMP, reticulocyte count, iron studies, B12 and folate. I will also check a peripheral flow cytometry to rule out lymphoma or leukemia. I will  repeat her thyroid function tests including TSH and free T3. I will check ANA, ESR and CRP. I will also check for HIV and check a myeloma panel. I will see the patient back in 3 weeks time to discuss the results of her blood work and further management. If patient has persistent leukopenia and anemia and an etiology is not evident with the about blood work I will consider a bone marrow biopsy at that time  2. Weight loss - likely secondary to her underlying GI issues. CT abdomen did not reveal any evidence of malignancy. She is a lifetime nonsmoker and does not see a compelling reason to do a CT thorax at this time.   Total face to face encounter time for this patient visit was 30 min. >50% of the time was  spent in counseling and coordination of care.   Visit Diagnosis 1. Anemia in other chronic diseases classified elsewhere   2. Lymphocytopenia   3. Hypothyroidism, unspecified type   4. Protein-calorie malnutrition, severe   5. Unintended weight loss     Dr. Randa Evens, MD, MPH [facility] at [facility] Pager- [phone] 02/09/2016 11:18 AM     Addendum: Patient's TSH again came back markedly elevated at 75. I think this needs to be evaluated by endocrinology to a certain if her systemic symptoms are secondary to uncontrolled hypothyroidism. Her anemia and leukopenia may also improve once her thyroid functions are under better control. Her CBC from today does show a normal white count of 7.8 which is improved since her prior counts. Also her anemia is much improved today at 11.6/35.1. I have therefore placed a consult for endocrinology for further evaluation

## 2016-02-10 LAB — ANA W/REFLEX: Anti Nuclear Antibody(ANA): NEGATIVE

## 2016-02-10 LAB — T3, FREE: T3, Free: 1.3 pg/mL — ABNORMAL LOW (ref 2.0–4.4)

## 2016-02-14 LAB — MULTIPLE MYELOMA PANEL, SERUM
ALPHA 1: 0.3 g/dL (ref 0.0–0.4)
Albumin SerPl Elph-Mcnc: 3.2 g/dL (ref 2.9–4.4)
Albumin/Glob SerPl: 1 (ref 0.7–1.7)
Alpha2 Glob SerPl Elph-Mcnc: 0.7 g/dL (ref 0.4–1.0)
B-Globulin SerPl Elph-Mcnc: 1.3 g/dL (ref 0.7–1.3)
Gamma Glob SerPl Elph-Mcnc: 1.1 g/dL (ref 0.4–1.8)
Globulin, Total: 3.4 g/dL (ref 2.2–3.9)
IGA: 607 mg/dL — AB (ref 87–352)
IGG (IMMUNOGLOBIN G), SERUM: 1272 mg/dL (ref 700–1600)
IGM, SERUM: 119 mg/dL (ref 26–217)
PDF SPE: 0
Total Protein ELP: 6.6 g/dL (ref 6.0–8.5)

## 2016-02-14 LAB — COMP PANEL: LEUKEMIA/LYMPHOMA

## 2016-02-14 LAB — KAPPA/LAMBDA LIGHT CHAINS
KAPPA FREE LGHT CHN: 28.9 mg/L — AB (ref 3.3–19.4)
Kappa, lambda light chain ratio: 1.4 (ref 0.26–1.65)
Lambda free light chains: 20.7 mg/L (ref 5.7–26.3)

## 2016-02-15 LAB — HIV ANTIBODY (ROUTINE TESTING W REFLEX): HIV SCREEN 4TH GENERATION: NONREACTIVE

## 2016-02-16 ENCOUNTER — Telehealth: Payer: Self-pay | Admitting: *Deleted

## 2016-02-16 NOTE — Telephone Encounter (Signed)
Per Dr Janese Banks pt needs to be referred to endocrinology for her elevated tsh.  I made ref. By faxing over notes, demographics, insurance and labs.  Called endocrinology yest. And they have tried to get in touch with her and they have not been able to contact her and they only speak to pt to give appt to aaure that people will come to the appt.  I called pt today and got no voicemail has been set up.  I tried her husband who has cell phone and is on  List and it rang and started to go to voicemail and was disconnected.  Will try back later.

## 2016-03-01 ENCOUNTER — Inpatient Hospital Stay: Payer: Medicare Other | Admitting: Oncology

## 2016-03-12 NOTE — Progress Notes (Signed)
Hematology/Oncology Consult note [facility]  Telephone:([phone] [phone]  Patient Care Team: [name], MD as PCP - General (Family Medicine)   Name of the patient: Adrienne Flores  RP:[id number]  Feb 27, 1965   Date of visit: 03/12/16  Diagnosis- leukopenia  Chief complaint/ Reason for visit- discuss results of bloodwork  Heme/Onc history: patient is a 51 year old female was then referred to [place] for evaluation and management of leukopenia. Patient had a normal CBC up until 11/14/2014 when her white count was 5.1, H&H was 11.7/34.5 and a platelet count of 155. Since then she has developed a gradually leukopenia and since October 2017 her white count has been between 2.7-3.9. Also she has had worsening anemia and her hemoglobin has been between 8.4-11. Her platelet counts have been more or less normal. Last CBC from 12/28/2015 showed a white count of 3.3, H&H of 8.4/25.7 and platelet count of 168. Differential on the CBC showed a normal neutrophil count with relative lymphocytopenia. BMP done one month ago showed hyponatremia with a sodium of 131, potassium of 2.9. LFTs were within normal limits. Ferritin in October 2017 was normal at 257. Folate was normal at 6.9. Haptoglobin was normal at 92. TSH in October 2017 was elevated at 37.43. T3 was low at 39. T4 was normal at 1.11. B12 was normal at 959. Patient has had multiple ER visits for abdominal pain nausea vomiting as well as constipation. She has been following up with GI at [facility] clinic. Last CT abdomen on 12/09/2015 showed mild diffuse circumferential colonic wall thickening involving the ascending and transverse colon. There was no evidence of any lymphadenopathy or malignancy. She has a history of hypothyroidism but had not been compliant with her medications in the past. Also has a history of rheumatoid arthritis. She was treated with methotrexate for the same but could not tolerate it and has  been off medications for a while. She reports no active rheumatoid arthritis symptoms and has not followed up with rheumatology in quite some time.  Currently patient reports a 40 pound weight loss over the last 1 year. She also has significant fatigue. She has had multiple ER admissions for abdominal pain. She continues to have problems with nausea and vomiting which waxes and wanes. Someday she is able to eat well but some days she says that her abdominal pain is so bad that she prefers not to eat for a few days. She has had EGD colonoscopy and multiple CT scans off her abdomen and no clear etiology of her abdominal pain has been ascertained.  Results of blood work from 02/09/2016 were as follows: CBC showed a normal white count of 7.8, H&H of 11.6/35.1 and platelet count of 387. Folate levels were slightly low at 4.8. B12 level was normal at 488. HIV testing was negative. Peripheral flow cytometry did not reveal any significant immunophenotyping abnormality. A lambda free light chain ratio was normal at 1. SPEP did not reveal any monoclonal protein however in no cessation showed a small IgA monoclonal protein with lambda light chain sensitivity. ANA testing was negative. TSH was elevated at 75.19. T3 levels were low at 1.3.  Interval history- continues to have abdominal pain which prevents her from eating at times. Says putting hot water on her abdomen helps. She has been losing weight as she has been unable to eat    Review of systems- Review of Systems  Constitutional: Positive for malaise/fatigue and weight loss. Negative for chills and fever.  HENT: Negative for congestion, ear discharge and nosebleeds.   Eyes: Negative for blurred vision.  Respiratory: Negative for cough, hemoptysis, sputum production, shortness of breath and wheezing.   Cardiovascular: Negative for chest pain, palpitations, orthopnea and claudication.  Gastrointestinal: Positive for abdominal pain, nausea and vomiting.  Negative for blood in stool, constipation, diarrhea, heartburn and melena.  Genitourinary: Negative for dysuria, flank pain, frequency, hematuria and urgency.  Musculoskeletal: Negative for back pain, joint pain and myalgias.  Skin: Negative for rash.  Neurological: Negative for dizziness, tingling, focal weakness, seizures, weakness and headaches.  Endo/Heme/Allergies: Does not bruise/bleed easily.  Psychiatric/Behavioral: Negative for depression and suicidal ideas. The patient does not have insomnia.        Allergies  Allergen Reactions  . Methotrexate Derivatives Other (See Comments)    headaches  . Morphine And Related Hives and Itching  . Relafen [Nabumetone] Other (See Comments)    headaches     Past Medical History:  Diagnosis Date  . At high risk for falls   . Chronic abdominal pain 2008   dates back to at least 2008.  multiple ultrasound, CT scans, x rays at [facility], [place].  [facility] colonoscopy, egd unrevealing.    . Colitis 2015   ? colitis per CTs in 2015, 2016 and 11/2015.  presumed infectious.  colonoscopy in 2015 negative for colitis.   . Constipation   . Cyclic vomiting syndrome 2015   per opinion  of Dr Granville Lewis, IBS specialist at [facility].   . Degenerative disc disease   . Depression   . Fibromyalgia   . Gastroparesis 03/2011   markedly abnormal gastric emptying study.   . Hematemesis 11/2015   mild to moderate blood in emesis appeared after 2 plus weeks of frequent, non-bloody emesis.   [name] Hemorrhoids   . Hemorrhoids   . History of chicken pox   . Hypokalemia   . Hypothyroid   . Lupus   . 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 Rheumatoid arthritis [facility])      Past Surgical History:  Procedure Laterality Date  . ABDOMINAL HYSTERECTOMY     total  . CESAREAN SECTION     x2  . CHOLECYSTECTOMY    . COLONOSCOPY  11/2013   at [facility] Dr Kennith Gain. Hemorrhoids. sigmoid tics. random biopsies negative for microscopic colitis or any pathology.   . ESOPHAGOGASTRODUODENOSCOPY   11/2013   Dr Kennith Gain at [facility].  Normal study.  Duodenal bx negative for villous atrophy.   . ESOPHAGOGASTRODUODENOSCOPY N/A 12/09/2015   Procedure: ESOPHAGOGASTRODUODENOSCOPY (EGD);  Surgeon: Doran Stabler, MD;  Location: [facility] ENDOSCOPY;  Service: Endoscopy;  Laterality: N/A;  . KNEE SURGERY Left   . OOPHORECTOMY    . REPLACEMENT TOTAL KNEE Bilateral     Social History   Social History  . Marital status: Married    Spouse name: N/A  . Number of children: N/A  . Years of education: N/A   Occupational History  . Not on file.   Social History Main Topics  . Smoking status: Never Smoker  . Smokeless tobacco: Never Used  . Alcohol use Yes     Comment: social  . Drug use: No  . Sexual activity: Yes   Other Topics Concern  . Not on file   Social History Narrative  . No narrative on file    Family History  Problem Relation Age of Onset  . Leukemia Mother   . Arthritis Mother   . Early death Mother   .  Miscarriages / [place] Mother   . Hypothyroidism Sister   . Cancer Paternal Grandmother   . Cancer Paternal Grandfather      Current Outpatient Prescriptions:  .  amitriptyline (ELAVIL) 75 MG tablet, Take 75 mg by mouth at bedtime., Disp: , Rfl:  .  dicyclomine (BENTYL) 20 MG tablet, Take 1 tablet (20 mg total) by mouth 2 (two) times daily. (Patient not taking: Reported on 02/09/2016), Disp: 20 tablet, Rfl: 0 .  gabapentin (NEURONTIN) 600 MG tablet, Take 1 tablet (600 mg total) by mouth 3 (three) times daily. (Patient not taking: Reported on 02/09/2016), Disp: 90 tablet, Rfl: 0 .  levothyroxine (SYNTHROID, LEVOTHROID) 25 MCG tablet, Take 25 mcg by mouth daily before breakfast. Take with 367mcg to = 376mcg total, Disp: , Rfl:  .  levothyroxine (SYNTHROID, LEVOTHROID) 300 MCG tablet, Take 300 mcg by mouth daily before breakfast. Take with a 37mcg tablet to = 360mcg, Disp: , Rfl:  .  meclizine (ANTIVERT) 25 MG tablet, Take 1 tablet (25 mg total) by mouth 3 (three) times  daily as needed. (Patient not taking: Reported on 02/09/2016), Disp: 21 tablet, Rfl: 0 .  metoCLOPramide (REGLAN) 10 MG tablet, Take 1 tablet (10 mg total) by mouth 4 (four) times daily. (Patient not taking: Reported on 02/09/2016), Disp: 12 tablet, Rfl: 0 .  ondansetron (ZOFRAN ODT) 4 MG disintegrating tablet, Take 1 tablet (4 mg total) by mouth every 8 (eight) hours as needed for nausea or vomiting. (Patient not taking: Reported on 02/09/2016), Disp: 20 tablet, Rfl: 0 .  oxyCODONE-acetaminophen (PERCOCET/ROXICET) 5-325 MG tablet, Take 1 tablet by mouth every 6 (six) hours as needed. (Patient taking differently: Take 1 tablet by mouth daily. ), Disp: 30 tablet, Rfl: 0 .  potassium chloride SA (KLOR-CON M20) 20 MEQ tablet, Take 1 tablet (20 mEq total) by mouth 2 (two) times daily. (Patient not taking: Reported on 02/09/2016), Disp: 14 tablet, Rfl: 0 .  promethazine (PHENERGAN) 12.5 MG tablet, Take 1 tablet (12.5 mg total) by mouth every 6 (six) hours as needed for nausea or vomiting. (Patient not taking: Reported on 02/09/2016), Disp: 30 tablet, Rfl: 0 .  promethazine (PHENERGAN) 25 MG suppository, Place 1 suppository (25 mg total) rectally every 6 (six) hours as needed for nausea. Do not use these if you are also using the oral Phenergan tablets you were previously prescribed. (Patient not taking: Reported on 02/09/2016), Disp: 20 suppository, Rfl: 1 .  traMADol (ULTRAM) 50 MG tablet, Take 50 mg by mouth 2 (two) times daily., Disp: , Rfl:   Physical exam:  Vitals:   03/13/16 1103  BP: (!) 124/98  Pulse: 85  Resp: 18  Temp: (!) 96.7 F (35.9 C)  TempSrc: Tympanic  Weight: 133 lb 14.9 oz (60.7 kg)   Physical Exam  Constitutional: She is oriented to person, place, and time.  She is thin and appears older than stated age  HENT:  Head: Normocephalic and atraumatic.  Eyes: EOM are normal. Pupils are equal, round, and reactive to light.  Neck: Normal range of motion.  Cardiovascular: Normal  rate, regular rhythm and normal heart sounds.   Pulmonary/Chest: Effort normal and breath sounds normal.  Abdominal: Soft. Bowel sounds are normal.  Neurological: She is alert and oriented to person, place, and time.  Skin: Skin is warm and dry.     CMP Latest Ref Rng & Units 12/28/2015  Glucose 65 - 99 mg/dL 102(H)  BUN 6 - 20 mg/dL 9  Creatinine 0.44 - 1.00 mg/dL  0.61  Sodium 135 - 145 mmol/L 132(L)  Potassium 3.5 - 5.1 mmol/L 3.2(L)  Chloride 101 - 111 mmol/L 100(L)  CO2 22 - 32 mmol/L 24  Calcium 8.9 - 10.3 mg/dL 8.9  Total Protein 6.5 - 8.1 g/dL -  Total Bilirubin 0.3 - 1.2 mg/dL -  Alkaline Phos 38 - 126 U/L -  AST 15 - 41 U/L -  ALT 14 - 54 U/L -   CBC Latest Ref Rng & Units 02/09/2016  WBC 3.6 - 11.0 K/uL 7.8  Hemoglobin 12.0 - 16.0 g/dL 11.6(L)  Hematocrit 35.0 - 47.0 % 35.1  Platelets 150 - 440 K/uL 387      Assessment and plan- Patient is a 51 y.o. female referred to [place] for leukopenia/ anemia likely secondary to underlying medical problems including hypothyroidism  1. Lymphocytopenia- WBC waxes and wanes and was normal during last check. H/H also significantly improved. No obvious etiology identified for anemia/ leukopenia other than hypothyroidism. She did have mildly low folate levels and I have suggested that she continue taking multivitamins. spep did not reveal monclonal protein but IFE identified IGAambda paraprotein. Will recheck this along with cbc, folate in 3 months time.   2. Hypothyroidism- we had made referral to endocrinology. They could not get in touch with patient. I will have the patient calll them to make an appointment. I again emphasized the importance of seeing them given uncontrolled hypothyroidism  3. F/u with GI for abdominal issues   Total face to face encounter time for this patient visit was 30 min. >50% of the time was  spent in counseling and coordination of care. I discussed results of her blood work and further management    Visit  Diagnosis 1. Anemia, unspecified type   2. Lymphocytopenia      Dr. Randa Evens, MD, MPH [facility] Pager- [phone] 03/13/2016 4:06 PM

## 2016-03-13 ENCOUNTER — Inpatient Hospital Stay: Payer: Medicare Other | Attending: Oncology | Admitting: Oncology

## 2016-03-13 VITALS — BP 124/98 | HR 85 | Temp 96.7°F | Resp 18 | Wt 133.9 lb

## 2016-03-13 DIAGNOSIS — D72810 Lymphocytopenia: Secondary | ICD-10-CM | POA: Diagnosis not present

## 2016-03-13 DIAGNOSIS — M329 Systemic lupus erythematosus, unspecified: Secondary | ICD-10-CM | POA: Insufficient documentation

## 2016-03-13 DIAGNOSIS — Z79899 Other long term (current) drug therapy: Secondary | ICD-10-CM | POA: Diagnosis not present

## 2016-03-13 DIAGNOSIS — R109 Unspecified abdominal pain: Secondary | ICD-10-CM | POA: Insufficient documentation

## 2016-03-13 DIAGNOSIS — M797 Fibromyalgia: Secondary | ICD-10-CM | POA: Diagnosis not present

## 2016-03-13 DIAGNOSIS — K3184 Gastroparesis: Secondary | ICD-10-CM

## 2016-03-13 DIAGNOSIS — R634 Abnormal weight loss: Secondary | ICD-10-CM

## 2016-03-13 DIAGNOSIS — D649 Anemia, unspecified: Secondary | ICD-10-CM | POA: Diagnosis not present

## 2016-03-13 DIAGNOSIS — R5381 Other malaise: Secondary | ICD-10-CM | POA: Insufficient documentation

## 2016-03-13 DIAGNOSIS — R5383 Other fatigue: Secondary | ICD-10-CM | POA: Diagnosis not present

## 2016-03-13 DIAGNOSIS — Z806 Family history of leukemia: Secondary | ICD-10-CM | POA: Diagnosis not present

## 2016-03-13 DIAGNOSIS — E871 Hypo-osmolality and hyponatremia: Secondary | ICD-10-CM

## 2016-03-13 DIAGNOSIS — E039 Hypothyroidism, unspecified: Secondary | ICD-10-CM | POA: Diagnosis not present

## 2016-03-13 NOTE — Progress Notes (Signed)
Here for follow up. Per pt diff eating and has pain after meals. States she crys sometimes w pain. Takes pain meds before she stated w effect. Has no pain meds left per husband.

## 2016-05-07 ENCOUNTER — Emergency Department: Payer: Medicare Other

## 2016-05-07 ENCOUNTER — Observation Stay
Admission: EM | Admit: 2016-05-07 | Discharge: 2016-05-08 | Disposition: A | Discharge status: Home / Self Care | Payer: Medicare Other | Attending: Specialist | Admitting: Specialist

## 2016-05-07 DIAGNOSIS — M797 Fibromyalgia: Secondary | ICD-10-CM | POA: Insufficient documentation

## 2016-05-07 DIAGNOSIS — F329 Major depressive disorder, single episode, unspecified: Secondary | ICD-10-CM | POA: Diagnosis not present

## 2016-05-07 DIAGNOSIS — E43 Unspecified severe protein-calorie malnutrition: Secondary | ICD-10-CM | POA: Insufficient documentation

## 2016-05-07 DIAGNOSIS — R110 Nausea: Secondary | ICD-10-CM

## 2016-05-07 DIAGNOSIS — Z888 Allergy status to other drugs, medicaments and biological substances status: Secondary | ICD-10-CM | POA: Insufficient documentation

## 2016-05-07 DIAGNOSIS — Z885 Allergy status to narcotic agent status: Secondary | ICD-10-CM | POA: Insufficient documentation

## 2016-05-07 DIAGNOSIS — Z79899 Other long term (current) drug therapy: Secondary | ICD-10-CM | POA: Diagnosis not present

## 2016-05-07 DIAGNOSIS — K3184 Gastroparesis: Secondary | ICD-10-CM | POA: Diagnosis present

## 2016-05-07 DIAGNOSIS — R1084 Generalized abdominal pain: Secondary | ICD-10-CM | POA: Diagnosis present

## 2016-05-07 DIAGNOSIS — E039 Hypothyroidism, unspecified: Secondary | ICD-10-CM | POA: Diagnosis not present

## 2016-05-07 DIAGNOSIS — Z6825 Body mass index (BMI) 25.0-25.9, adult: Secondary | ICD-10-CM | POA: Insufficient documentation

## 2016-05-07 LAB — COMPREHENSIVE METABOLIC PANEL
ALBUMIN: 3.5 g/dL (ref 3.5–5.0)
ALK PHOS: 152 U/L — AB (ref 38–126)
ALT: 13 U/L — ABNORMAL LOW (ref 14–54)
AST: 25 U/L (ref 15–41)
Anion gap: 10 (ref 5–15)
BILIRUBIN TOTAL: 0.5 mg/dL (ref 0.3–1.2)
BUN: 14 mg/dL (ref 6–20)
CALCIUM: 9.1 mg/dL (ref 8.9–10.3)
CO2: 22 mmol/L (ref 22–32)
Chloride: 109 mmol/L (ref 101–111)
Creatinine, Ser: 1.03 mg/dL — ABNORMAL HIGH (ref 0.44–1.00)
GFR calc Af Amer: >60 mL/min (ref >60)
GLUCOSE: 99 mg/dL (ref 65–99)
Potassium: 4 mmol/L (ref 3.5–5.1)
Sodium: 141 mmol/L (ref 135–145)
Total Protein: 8.2 g/dL — ABNORMAL HIGH (ref 6.5–8.1)

## 2016-05-07 LAB — URINALYSIS, COMPLETE (UACMP) WITH MICROSCOPIC
BILIRUBIN URINE: NEGATIVE
Glucose, UA: NEGATIVE mg/dL
Hgb urine dipstick: NEGATIVE
Ketones, ur: NEGATIVE mg/dL
Leukocytes, UA: NEGATIVE
Nitrite: NEGATIVE
Protein, ur: NEGATIVE mg/dL
SPECIFIC GRAVITY, URINE: 1.008 (ref 1.005–1.030)
pH: 5 (ref 5.0–8.0)

## 2016-05-07 LAB — CBC
HEMATOCRIT: 30.4 % — AB (ref 35.0–47.0)
Hemoglobin: 10.2 g/dL — ABNORMAL LOW (ref 12.0–16.0)
MCH: 30.3 pg (ref 26.0–34.0)
MCHC: 33.4 g/dL (ref 32.0–36.0)
MCV: 90.8 fL (ref 80.0–100.0)
Platelets: 342 10*3/uL (ref 150–440)
RBC: 3.35 MIL/uL — ABNORMAL LOW (ref 3.80–5.20)
RDW: 17 % — AB (ref 11.5–14.5)
WBC: 7.1 10*3/uL (ref 3.6–11.0)

## 2016-05-07 LAB — LIPASE, BLOOD: Lipase: 13 U/L (ref 11–51)

## 2016-05-07 MED ORDER — METOCLOPRAMIDE HCL 10 MG PO TABS
10.0000 mg | ORAL_TABLET | Freq: Four times a day (QID) | ORAL | Status: DC
  Administered 2016-05-07 – 2016-05-08 (×3): 10 mg via ORAL
  Filled 2016-05-07 (×3): qty 1

## 2016-05-07 MED ORDER — BISACODYL 5 MG PO TBEC: 5.0000 mg | DELAYED_RELEASE_TABLET | Freq: Every day | ORAL | Status: DC | PRN

## 2016-05-07 MED ORDER — HYDROMORPHONE HCL 1 MG/ML IJ SOLN
1.0000 mg | Freq: Once | INTRAMUSCULAR | Status: AC
  Administered 2016-05-07: 1 mg via INTRAVENOUS
  Filled 2016-05-07: qty 1

## 2016-05-07 MED ORDER — ACETAMINOPHEN 325 MG PO TABS: 650.0000 mg | ORAL_TABLET | Freq: Four times a day (QID) | ORAL | Status: DC | PRN

## 2016-05-07 MED ORDER — ONDANSETRON HCL 4 MG/2ML IJ SOLN
4.0000 mg | Freq: Four times a day (QID) | INTRAMUSCULAR | Status: DC | PRN
  Administered 2016-05-07: 4 mg via INTRAVENOUS
  Filled 2016-05-07: qty 2

## 2016-05-07 MED ORDER — AMITRIPTYLINE HCL 75 MG PO TABS
75.0000 mg | ORAL_TABLET | Freq: Every day | ORAL | Status: DC
  Administered 2016-05-07: 75 mg via ORAL
  Filled 2016-05-07 (×2): qty 1

## 2016-05-07 MED ORDER — PANTOPRAZOLE SODIUM 40 MG IV SOLR
40.0000 mg | Freq: Two times a day (BID) | INTRAVENOUS | Status: DC
  Administered 2016-05-07 – 2016-05-08 (×2): 40 mg via INTRAVENOUS
  Filled 2016-05-07 (×2): qty 40

## 2016-05-07 MED ORDER — TRAZODONE HCL 50 MG PO TABS
25.0000 mg | ORAL_TABLET | Freq: Every evening | ORAL | Status: DC | PRN
Start: 2016-05-07 — End: 2016-05-08
  Administered 2016-05-07: 25 mg via ORAL
  Filled 2016-05-07: qty 1

## 2016-05-07 MED ORDER — ACETAMINOPHEN 650 MG RE SUPP: 650.0000 mg | Freq: Four times a day (QID) | RECTAL | Status: DC | PRN

## 2016-05-07 MED ORDER — LEVOTHYROXINE SODIUM 100 MCG PO TABS
100.0000 ug | ORAL_TABLET | Freq: Every day | ORAL | Status: DC
  Administered 2016-05-08: 100 ug via ORAL
  Filled 2016-05-07: qty 1

## 2016-05-07 MED ORDER — SODIUM CHLORIDE 0.9 % IV SOLN
Freq: Once | INTRAVENOUS | Status: AC
  Administered 2016-05-07: 10:00:00 via INTRAVENOUS

## 2016-05-07 MED ORDER — HEPARIN SODIUM (PORCINE) 5000 UNIT/ML IJ SOLN
5000.0000 [IU] | Freq: Three times a day (TID) | INTRAMUSCULAR | Status: DC
  Administered 2016-05-07 – 2016-05-08 (×2): 5000 [IU] via SUBCUTANEOUS
  Filled 2016-05-07 (×2): qty 1

## 2016-05-07 MED ORDER — LORAZEPAM 2 MG/ML IJ SOLN
1.0000 mg | Freq: Once | INTRAMUSCULAR | Status: AC
  Administered 2016-05-07: 1 mg via INTRAVENOUS
  Filled 2016-05-07: qty 1

## 2016-05-07 MED ORDER — ONDANSETRON HCL 4 MG/2ML IJ SOLN
4.0000 mg | Freq: Once | INTRAMUSCULAR | Status: AC
  Administered 2016-05-07: 4 mg via INTRAVENOUS
  Filled 2016-05-07: qty 2

## 2016-05-07 MED ORDER — HYDROMORPHONE HCL 1 MG/ML IJ SOLN
0.5000 mg | Freq: Once | INTRAMUSCULAR | Status: AC
  Administered 2016-05-07: 0.5 mg via INTRAVENOUS
  Filled 2016-05-07: qty 1

## 2016-05-07 MED ORDER — DOCUSATE SODIUM 100 MG PO CAPS
100.0000 mg | ORAL_CAPSULE | Freq: Two times a day (BID) | ORAL | Status: DC
  Administered 2016-05-07 – 2016-05-08 (×3): 100 mg via ORAL
  Filled 2016-05-07 (×3): qty 1

## 2016-05-07 MED ORDER — SODIUM CHLORIDE 0.9 % IV SOLN
INTRAVENOUS | Status: DC
  Administered 2016-05-07 – 2016-05-08 (×2): via INTRAVENOUS

## 2016-05-07 MED ORDER — DICYCLOMINE HCL 20 MG PO TABS
20.0000 mg | ORAL_TABLET | Freq: Two times a day (BID) | ORAL | Status: DC
  Administered 2016-05-07 – 2016-05-08 (×3): 20 mg via ORAL
  Filled 2016-05-07 (×4): qty 1

## 2016-05-07 MED ORDER — ONDANSETRON HCL 4 MG PO TABS: 4.0000 mg | ORAL_TABLET | Freq: Four times a day (QID) | ORAL | Status: DC | PRN

## 2016-05-07 MED ORDER — HYDROMORPHONE HCL 1 MG/ML IJ SOLN
2.0000 mg | INTRAMUSCULAR | Status: DC | PRN
  Administered 2016-05-07 – 2016-05-08 (×4): 2 mg via INTRAVENOUS
  Filled 2016-05-07 (×4): qty 2

## 2016-05-07 NOTE — ED Provider Notes (Signed)
[facility] Emergency Department Provider Note        Time seen: ----------------------------------------- 9:15 AM on 05/07/2016 -----------------------------------------    I have reviewed the triage vital signs and the nursing notes.   HISTORY   Chief Complaint Abdominal Pain    HPI Adrienne Flores is a 52 y.o. female who presents to the ED for lower abdominal pain that started in the middle the night with nausea. Patient states she has history of chronic abdominal pain but has not had any in the last year. She complains of nausea but no vomiting or diarrhea. She did have 3 bowel movements this morning that were loose but not diarrhea. She denies fevers, chills, chest pain or difficulty breathing.   Past Medical History:  Diagnosis Date  . At high risk for falls   . Chronic abdominal pain 2008   dates back to at least 2008.  multiple ultrasound, CT scans, x rays at [facility], [place].  [facility] colonoscopy, egd unrevealing.    . Colitis 2015   ? colitis per CTs in 2015, 2016 and 11/2015.  presumed infectious.  colonoscopy in 2015 negative for colitis.   . Constipation   . Cyclic vomiting syndrome 2015   per opinion  of Dr Granville Lewis, IBS specialist at [facility].   . Degenerative disc disease   . Depression   . Fibromyalgia   . Gastroparesis 03/2011   markedly abnormal gastric emptying study.   . Hematemesis 11/2015   mild to moderate blood in emesis appeared after 2 plus weeks of frequent, non-bloody emesis.   [name] Hemorrhoids   . Hemorrhoids   . History of chicken pox   . Hypokalemia   . Hypothyroid   . Lupus   . 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 Rheumatoid arthritis [facility])     Patient Active Problem List   Diagnosis Date Noted  . Protein-calorie malnutrition, severe 12/12/2015  . Symptomatic anemia 12/09/2015  . Acute hyponatremia 12/09/2015  . Hematemesis 12/09/2015  . Chronic hoarseness 12/09/2015  . Unintended weight loss 12/09/2015  . Rheu  arthrit of right ank/ft w involv of organs and systems ([place]) 06/16/2014  . DDD (degenerative disc disease), cervical 06/16/2014  . 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 DDD (degenerative disc disease), lumbosacral 06/16/2014  . Chronic abdominal and back  pain 07/26/2013  . Nausea and vomiting 07/26/2013  . 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 Rheumatoid arthritis ([place]) 07/26/2013  . UTI (lower urinary tract infection) 07/26/2013  . Uncontrolled pain 07/26/2013  . Acute on chronic renal failure ([place]) 06/27/2013  . Colitis, infectious 06/25/2013  . AKI (acute kidney injury) ([place]) 06/25/2013  . Transaminitis 06/25/2013  . Infectious colitis 06/25/2013  . Abdominal pain 11/26/2012  . Hypothyroidism 11/26/2012    Past Surgical History:  Procedure Laterality Date  . ABDOMINAL HYSTERECTOMY     total  . CESAREAN SECTION     x2  . CHOLECYSTECTOMY    . COLONOSCOPY  11/2013   at [facility] Dr Kennith Gain. Hemorrhoids. sigmoid tics. random biopsies negative for microscopic colitis or any pathology.   . ESOPHAGOGASTRODUODENOSCOPY  11/2013   Dr Kennith Gain at [facility].  Normal study.  Duodenal bx negative for villous atrophy.   . ESOPHAGOGASTRODUODENOSCOPY N/A 12/09/2015   Procedure: ESOPHAGOGASTRODUODENOSCOPY (EGD);  Surgeon: Doran Stabler, MD;  Location: [facility] ENDOSCOPY;  Service: Endoscopy;  Laterality: N/A;  . KNEE SURGERY Left   . OOPHORECTOMY    . REPLACEMENT TOTAL KNEE Bilateral  Allergies Methotrexate derivatives; Morphine and related; and Relafen [nabumetone]  Social History Social History  Substance Use Topics  . Smoking status: Never Smoker  . Smokeless tobacco: Never Used  . Alcohol use Yes     Comment: social    Review of Systems Constitutional: Negative for fever. Cardiovascular: Negative for chest pain. Respiratory: Negative for shortness of breath. Gastrointestinal: Positive for abdominal pain, nausea Genitourinary: Negative for dysuria. Musculoskeletal: Negative  for back pain. Skin: Negative for rash. Neurological: Negative for headaches, focal weakness or numbness.  10-point ROS otherwise negative.  ____________________________________________   PHYSICAL EXAM:  VITAL SIGNS: ED Triage Vitals  Enc Vitals Group     BP 05/07/16 0812 (!) 170/93     Pulse Rate 05/07/16 0812 (!) 101     Resp 05/07/16 0812 18     Temp 05/07/16 0812 98 F (36.7 C)     Temp Source 05/07/16 0812 Oral     SpO2 05/07/16 0812 99 %     Weight 05/07/16 0813 133 lb (60.3 kg)     Height 05/07/16 0813 5\' 1"  (1.549 m)     Head Circumference --      Peak Flow --      Pain Score 05/07/16 0813 10     Pain Loc --      Pain Edu? --      Excl. in [place]? --     Constitutional: Alert and oriented. Anxious, mild to moderate distress Eyes: Conjunctivae are normal. PERRL. Normal extraocular movements. ENT   Head: Normocephalic and atraumatic.   Nose: No congestion/rhinnorhea.   Mouth/Throat: Mucous membranes are moist.   Neck: No stridor. Cardiovascular: Rapid rate, regular rhythm. No murmurs, rubs, or gallops. Respiratory: Normal respiratory effort without tachypnea nor retractions. Breath sounds are clear and equal bilaterally. No wheezes/rales/rhonchi. Gastrointestinal: Nonfocal tenderness, no rebound or guarding. Normal bowel sounds. Musculoskeletal: Nontender with normal range of motion in extremities. No lower extremity tenderness nor edema. Neurologic:  Normal speech and language. No gross focal neurologic deficits are appreciated.  Skin:  Skin is warm, dry and intact. No rash noted. Psychiatric: Anxious mood and affect ____________________________________________  ED COURSE:  Pertinent labs & imaging results that were available during my care of the patient were reviewed by me and considered in my medical decision making (see chart for details). Patient presents for abdominal pain and nausea, we will assess with labs and imaging as indicated.    Procedures ____________________________________________   LABS (pertinent positives/negatives)  Labs Reviewed  COMPREHENSIVE METABOLIC PANEL - Abnormal; Notable for the following:       Result Value   Creatinine, Ser 1.03 (*)    Total Protein 8.2 (*)    ALT 13 (*)    Alkaline Phosphatase 152 (*)    All other components within normal limits  CBC - Abnormal; Notable for the following:    RBC 3.35 (*)    Hemoglobin 10.2 (*)    HCT 30.4 (*)    RDW 17.0 (*)    All other components within normal limits  URINALYSIS, COMPLETE (UACMP) WITH MICROSCOPIC - Abnormal; Notable for the following:    Color, Urine STRAW (*)    APPearance CLEAR (*)    Bacteria, UA RARE (*)    Squamous Epithelial / LPF 0-5 (*)    All other components within normal limits  LIPASE, BLOOD    RADIOLOGY Images were viewed by me Abdomen 2 view IMPRESSION: Benign-appearing abdomen.  ____________________________________________  FINAL ASSESSMENT AND PLAN  Abdominal pain,  nausea and vomiting  Plan: Patient's labs and imaging were dictated above. Patient had presented for intractable vomiting, abdominal pain and general ill feeling. Labs were reassuring but patient does not feel well enough to go home. I will discuss with the hospitalist for admission.   Earleen Newport, MD   Note: This note was generated in part or whole with voice recognition software. Voice recognition is usually quite accurate but there are transcription errors that can and very often do occur. I apologize for any typographical errors that were not detected and corrected.     Earleen Newport, MD 05/07/16 1310

## 2016-05-07 NOTE — ED Notes (Signed)
Patient has port R chest. easily accessed with sterile technique and flushes easily however will not draw blood specimen.

## 2016-05-07 NOTE — ED Triage Notes (Signed)
Pt c/o mid to lower abd pain that started in them middle of the night with nausea.. States she has a hx of chronic abd pain but has not had any in over a year.[name]

## 2016-05-07 NOTE — H&P (Signed)
[facility] NAME: Adrienne Flores    MR#:  [id number]  DATE OF BIRTH:  Dec 19, 1965  DATE OF ADMISSION:  05/07/2016  PRIMARY CARE PHYSICIAN: [name], MD   REQUESTING/REFERRING PHYSICIAN: Jimmye Norman  CHIEF COMPLAINT:   Chief Complaint  Patient presents with  . Abdominal Pain    HISTORY OF PRESENT ILLNESS:  Adrienne Flores  is a 51 y.o. female with a known history of Hypothyroidism, depression  Came in because of abdominal pain in epigastric area. Started yesterday 10/10 severity , radiating to the back associated with nausea. No diarrhea or fevers. She received multiple doses of IV  Narcotics  in the emergency room without relief and she is requesting to stay over night. PAST MEDICAL HISTORY:   Past Medical History:  Diagnosis Date  . At high risk for falls   . Chronic abdominal pain 2008   dates back to at least 2008.  multiple ultrasound, CT scans, x rays at [facility], [place].  [facility] colonoscopy, egd unrevealing.    . Colitis 2015   ? colitis per CTs in 2015, 2016 and 11/2015.  presumed infectious.  colonoscopy in 2015 negative for colitis.   . Constipation   . Cyclic vomiting syndrome 2015   per opinion  of Dr Granville Lewis, IBS specialist at [facility].   . Degenerative disc disease   . Depression   . Fibromyalgia   . Gastroparesis 03/2011   markedly abnormal gastric emptying study.   . Hematemesis 11/2015   mild to moderate blood in emesis appeared after 2 plus weeks of frequent, non-bloody emesis.   [name] Hemorrhoids   . Hemorrhoids   . History of chicken pox   . Hypokalemia   . Hypothyroid   . Lupus   . 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 Rheumatoid arthritis ([place])     PAST SURGICAL HISTOIRY:   Past Surgical History:  Procedure Laterality Date  . ABDOMINAL HYSTERECTOMY     total  . CESAREAN SECTION     x2  . CHOLECYSTECTOMY    . COLONOSCOPY  11/2013   at [facility] Dr Kennith Gain. Hemorrhoids. sigmoid tics. random biopsies  negative for microscopic colitis or any pathology.   . ESOPHAGOGASTRODUODENOSCOPY  11/2013   Dr Kennith Gain at [facility].  Normal study.  Duodenal bx negative for villous atrophy.   . ESOPHAGOGASTRODUODENOSCOPY N/A 12/09/2015   Procedure: ESOPHAGOGASTRODUODENOSCOPY (EGD);  Surgeon: Doran Stabler, MD;  Location: [facility] ENDOSCOPY;  Service: Endoscopy;  Laterality: N/A;  . KNEE SURGERY Left   . OOPHORECTOMY    . REPLACEMENT TOTAL KNEE Bilateral     SOCIAL HISTORY:   Social History  Substance Use Topics  . Smoking status: Never Smoker  . Smokeless tobacco: Never Used  . Alcohol use Yes     Comment: social    FAMILY HISTORY:   Family History  Problem Relation Age of Onset  . Leukemia Mother   . Arthritis Mother   . Early death Mother   . Miscarriages / [place] Mother   . Hypothyroidism Sister   . Cancer Paternal Grandmother   . Cancer Paternal Grandfather     DRUG ALLERGIES:   Allergies  Allergen Reactions  . Methotrexate Derivatives Other (See Comments)    headaches  . Morphine And Related Hives and Itching  . Relafen [Nabumetone] Other (See Comments)    headaches    REVIEW OF SYSTEMS:  CONSTITUTIONAL: No fever, fatigue or weakness.  EYES: No blurred or  double vision.  EARS, NOSE, AND THROAT: No tinnitus or ear pain.  RESPIRATORY: No cough, shortness of breath, wheezing or hemoptysis.  CARDIOVASCULAR: No chest pain, orthopnea, edema.  GASTROINTESTINAL: No nausea, vomiting, diarrhea or abdominal pain.  GENITOURINARY: No dysuria, hematuria.  ENDOCRINE: No polyuria, nocturia,  HEMATOLOGY: No anemia, easy bruising or bleeding SKIN: No rash or lesion. MUSCULOSKELETAL: No joint pain or arthritis.   NEUROLOGIC: No tingling, numbness, weakness.  PSYCHIATRY: No anxiety or depression.   MEDICATIONS AT HOME:   Prior to Admission medications   Medication Sig Start Date End Date Taking? Authorizing Provider  Aspirin-Acetaminophen-Caffeine (EXCEDRIN PO) Take 1 tablet by mouth.    Yes Historical Provider, MD  gabapentin (NEURONTIN) 600 MG tablet Take 1 tablet (600 mg total) by mouth 3 (three) times daily. Patient taking differently: Take 600 mg by mouth 2 (two) times daily.  12/14/15  Yes Bonnielee Haff, MD  levothyroxine (SYNTHROID, LEVOTHROID) 100 MCG tablet Take 300 mcg by mouth daily before breakfast.    Yes Historical Provider, MD  amitriptyline (ELAVIL) 75 MG tablet Take 75 mg by mouth at bedtime.    Historical Provider, MD  dicyclomine (BENTYL) 20 MG tablet Take 1 tablet (20 mg total) by mouth 2 (two) times daily. Patient not taking: Reported on 02/09/2016 10/22/14   Gareth Morgan, MD  levothyroxine (SYNTHROID, LEVOTHROID) 25 MCG tablet Take 25 mcg by mouth daily before breakfast. Take with 359mcg to = 360mcg total    Historical Provider, MD  meclizine (ANTIVERT) 25 MG tablet Take 1 tablet (25 mg total) by mouth 3 (three) times daily as needed. Patient not taking: Reported on 02/09/2016 09/11/13   Dalia Heading, PA-C  metoCLOPramide (REGLAN) 10 MG tablet Take 1 tablet (10 mg total) by mouth 4 (four) times daily. Patient not taking: Reported on 05/07/2016 05/03/13   Carlisle Cater, PA-C  ondansetron (ZOFRAN ODT) 4 MG disintegrating tablet Take 1 tablet (4 mg total) by mouth every 8 (eight) hours as needed for nausea or vomiting. Patient not taking: Reported on 02/09/2016 10/22/14   Gareth Morgan, MD  oxyCODONE-acetaminophen (PERCOCET/ROXICET) 5-325 MG tablet Take 1 tablet by mouth every 6 (six) hours as needed. Patient not taking: Reported on 03/13/2016 12/14/15   Bonnielee Haff, MD  potassium chloride SA (KLOR-CON M20) 20 MEQ tablet Take 1 tablet (20 mEq total) by mouth 2 (two) times daily. Patient not taking: Reported on 02/09/2016 12/20/15   Hinda Kehr, MD  promethazine (PHENERGAN) 12.5 MG tablet Take 1 tablet (12.5 mg total) by mouth every 6 (six) hours as needed for nausea or vomiting. Patient not taking: Reported on 02/09/2016 12/13/15   Shon Millet,  DO  promethazine (PHENERGAN) 25 MG suppository Place 1 suppository (25 mg total) rectally every 6 (six) hours as needed for nausea. Do not use these if you are also using the oral Phenergan tablets you were previously prescribed. Patient not taking: Reported on 02/09/2016 12/20/15 12/19/16  Hinda Kehr, MD  traMADol (ULTRAM) 50 MG tablet Take 50 mg by mouth 2 (two) times daily.    Historical Provider, MD      VITAL SIGNS:  Blood pressure 135/76, pulse 89, temperature 98 F (36.7 C), temperature source Oral, resp. rate (!) 23, height 5\' 1"  (1.549 m), weight 60.3 kg (133 lb), SpO2 100 %.  PHYSICAL EXAMINATION:  GENERAL:  51 y.o.-year-old patient lying in the bed with no acute distress.  EYES: Pupils equal, round, reactive to light and accommodation. No scleral icterus. Extraocular muscles intact.  HEENT: Head atraumatic,  normocephalic. Oropharynx and nasopharynx clear.  NECK:  Supple, no jugular venous distention. No thyroid enlargement, no tenderness.  LUNGS: Normal breath sounds bilaterally, no wheezing, rales,rhonchi or crepitation. No use of accessory muscles of respiration.  CARDIOVASCULAR: S1, S2 normal. No murmurs, rubs, or gallops.  ABDOMEN: tenderness in epigastric area.BS present.,no Organomegaly, no CVA tenderness present.  EXTREMITIES: No pedal edema, cyanosis, or clubbing.  NEUROLOGIC: Cranial nerves II through XII are intact. Muscle strength 5/5 in all extremities. Sensation intact. Gait not checked.  PSYCHIATRIC: The patient is alert and oriented x 3.  SKIN: No obvious rash, lesion, or ulcer.   LABORATORY PANEL:   CBC  Recent Labs Lab 05/07/16 0947  WBC 7.1  HGB 10.2*  HCT 30.4*  PLT 342   ------------------------------------------------------------------------------------------------------------------  Chemistries   Recent Labs Lab 05/07/16 0947  NA 141  K 4.0  CL 109  CO2 22  GLUCOSE 99  BUN 14  CREATININE 1.03*  CALCIUM 9.1  AST 25  ALT 13*  ALKPHOS  152*  BILITOT 0.5   ------------------------------------------------------------------------------------------------------------------  Cardiac Enzymes No results for input(s): TROPONINI in the last 168 hours. ------------------------------------------------------------------------------------------------------------------  RADIOLOGY:  Dg Abd 2 Views  Result Date: 05/07/2016 CLINICAL DATA:  Abdominal pain and nausea. EXAM: ABDOMEN - 2 VIEW COMPARISON:  Radiographs dated 12/20/2015 FINDINGS: Power port in place. The bowel gas pattern is normal with no free air or free fluid. No dilated loops of bowel. Prior cholecystectomy. Surgical clip in the left lower quadrant. Bones are normal. IMPRESSION: Benign-appearing abdomen. Electronically Signed   By: Lorriane Shire M.D.   On: 05/07/2016 10:39    EKG:   Orders placed or performed during the hospital encounter of 12/25/15  . EKG 12-Lead  . EKG 12-Lead    IMPRESSION AND PLAN:   Acute abdominal pain in epigastric area likely secondary to acute gastritis: Admitted to overnight observation status, continue IV fluids, IV PPIs, IV Dilaudid. Patient states that she can tolerate  Iv dialudid.. Continue clear liquids, likely discharge tomorrow . Ct abdomen in oct ,2017 showed possible colitis,if her symptoms of abdominal pain does not resolve with conservative treatment ,will get CT abdomen .but now she has normal labs ,normal xray abdomen.so I don't think she needs any further work up/same explained to her husband. #2 hypothyroidism continue Synthroid at 100 mcg daily,changed the dose recently by endocrinologist. #3 history of depression, fibromyalgia: Patient states that she is not taking any Neurontin, medicines for depression.[name]  only Medicines she takes  is thyroid medicine. 4.hypochondria;pt is crying non stop thinking that she may have leukemia,( says   The her mother died when she was 89 yr old  with leukemia ,,now she thinks she has serious  health problems)seen DR.Randa Evens. Spoke with her husband ,and will request psych eva;[name]  All the records are reviewed and case discussed with ED provider. Management plans discussed with the patient, family and they are in agreement.  CODE STATUS: full  TOTAL TIME TAKING CARE OF THIS PATIENT: 55 minutes.    Epifanio Lesches M.D on 05/07/2016 at 2:15 PM  Between 7am to 6pm - Pager - [phone]  After 6pm go to [email or web address] - password [email or web address] [place] [facility]  Office  [phone]  CC: Primary care physician; [name], MD  Note: This dictation was prepared with Dragon dictation along with smaller phrase technology. Any transcriptional errors that result from this process are unintentional.

## 2016-05-07 NOTE — ED Notes (Signed)
Pt refuses to have blood drawn, states she wants to wait to have her portacath access due to being a "hard stick".[name]

## 2016-05-07 NOTE — Progress Notes (Signed)
Patient arrived from the ED via stretcher.  Patient is with the patient and supportive

## 2016-05-08 LAB — BASIC METABOLIC PANEL
ANION GAP: 7 (ref 5–15)
BUN: 10 mg/dL (ref 6–20)
CALCIUM: 8.1 mg/dL — AB (ref 8.9–10.3)
CO2: 23 mmol/L (ref 22–32)
Chloride: 109 mmol/L (ref 101–111)
Creatinine, Ser: 0.76 mg/dL (ref 0.44–1.00)
GLUCOSE: 113 mg/dL — AB (ref 65–99)
Potassium: 3.2 mmol/L — ABNORMAL LOW (ref 3.5–5.1)
SODIUM: 139 mmol/L (ref 135–145)

## 2016-05-08 LAB — CBC
HEMATOCRIT: 26 % — AB (ref 35.0–47.0)
Hemoglobin: 8.6 g/dL — ABNORMAL LOW (ref 12.0–16.0)
MCH: 30.2 pg (ref 26.0–34.0)
MCHC: 33.1 g/dL (ref 32.0–36.0)
MCV: 91.4 fL (ref 80.0–100.0)
Platelets: 309 10*3/uL (ref 150–440)
RBC: 2.84 MIL/uL — ABNORMAL LOW (ref 3.80–5.20)
RDW: 17.1 % — AB (ref 11.5–14.5)
WBC: 4.6 10*3/uL (ref 3.6–11.0)

## 2016-05-08 LAB — GLUCOSE, CAPILLARY: GLUCOSE-CAPILLARY: 79 mg/dL (ref 65–99)

## 2016-05-08 MED ORDER — ONDANSETRON 4 MG PO TBDP: 4.0000 mg | ORAL_TABLET | Freq: Three times a day (TID) | ORAL | 0 refills | Status: DC | PRN

## 2016-05-08 MED ORDER — DICYCLOMINE HCL 20 MG PO TABS: 20.0000 mg | ORAL_TABLET | Freq: Two times a day (BID) | ORAL | 0 refills | Status: DC

## 2016-05-08 MED ORDER — HEPARIN SOD (PORK) LOCK FLUSH 100 UNIT/ML IV SOLN
500.0000 [IU] | Freq: Once | INTRAVENOUS | Status: AC
  Administered 2016-05-08: 500 [IU] via INTRAVENOUS
  Filled 2016-05-08: qty 5

## 2016-05-08 NOTE — Progress Notes (Signed)
Patient discharge teaching given, including activity, diet, follow-up appoints, and medications. Patient verbalized understanding of all discharge instructions. Port access was flushed and d/c'd per order. Vitals are stable. Skin is intact except as charted in most recent assessments. Pt to be escorted out by NT, to be driven home by family.  Kendrah Lovern [employer]

## 2016-05-08 NOTE — Care Management Obs Status (Signed)
[place] NOTIFICATION   Patient Details  Name: Adrienne Flores MRN: [id number] Date of Birth: June 03, 1965   Medicare Observation Status Notification Given:  No (admitted observation less than 24 hours)    Beverly Sessions, RN 05/08/2016, 2:12 PM

## 2016-05-08 NOTE — Discharge Summary (Signed)
[facility] [place] NAME: Adrienne Flores    MR#:  [id number]  DATE OF BIRTH:  March 18, 1965  DATE OF ADMISSION:  05/07/2016 ADMITTING PHYSICIAN: Epifanio Lesches, MD  DATE OF DISCHARGE: 05/08/2016  PRIMARY CARE PHYSICIAN: [name], MD    ADMISSION DIAGNOSIS:  Nausea [R11.0] Generalized abdominal pain [R10.84] Abdominal pain [R10.9]  DISCHARGE DIAGNOSIS:  Active Problems:   Abdominal pain   SECONDARY DIAGNOSIS:   Past Medical History:  Diagnosis Date  . At high risk for falls   . Chronic abdominal pain 2008   dates back to at least 2008.  multiple ultrasound, CT scans, x rays at [facility], [place].  [facility] colonoscopy, egd unrevealing.    . Colitis 2015   ? colitis per CTs in 2015, 2016 and 11/2015.  presumed infectious.  colonoscopy in 2015 negative for colitis.   . Constipation   . Cyclic vomiting syndrome 2015   per opinion  of Dr Granville Lewis, IBS specialist at [facility].   . Degenerative disc disease   . Depression   . Fibromyalgia   . Gastroparesis 03/2011   markedly abnormal gastric emptying study.   . Hematemesis 11/2015   mild to moderate blood in emesis appeared after 2 plus weeks of frequent, non-bloody emesis.   [name] Hemorrhoids   . Hemorrhoids   . History of chicken pox   . Hypokalemia   . Hypothyroid   . Lupus   . 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 Rheumatoid arthritis [facility])     HOSPITAL COURSE:   51 year old female with past medical history of hypothyroidism, rheumatoid arthritis, fibromyalgia, depression, anxiety who presented to the hospital due to abdominal pain with nausea vomiting.  1. Abdominal pain-etiology unclear, likely psychosomatic in nature. Patient was admitted to the hospital under observation treated with IV narcotics, IV PPI. Her abdominal exam was benign, heart abdominal x-ray showed no acute pathology. Her LFTs, electrolytes are within normal range. -Patient has been tolerating a full liquid diet  well. She feels better today and therefore being discharged home.  2. Hypothyroidism-patient will continue her Synthroid.  3. History of depression/fibromyalgia-patient will continue her Elavil.  4. Anxiety-patient was tearful admission, this is primarily the source of her problems. She had no SI/HI.  - she was advised to seek a Psychiatric consult as outpatient.   DISCHARGE CONDITIONS:   Stable.   CONSULTS OBTAINED:  Treatment Team:  Clovis Fredrickson, MD  DRUG ALLERGIES:   Allergies  Allergen Reactions  . Methotrexate Derivatives Other (See Comments)    headaches  . Morphine And Related Hives and Itching  . Relafen [Nabumetone] Other (See Comments)    headaches    DISCHARGE MEDICATIONS:   Allergies as of 05/08/2016      Reactions   Methotrexate Derivatives Other (See Comments)   headaches   Morphine And Related Hives, Itching   Relafen [nabumetone] Other (See Comments)   headaches      Medication List    STOP taking these medications   metoCLOPramide 10 MG tablet Commonly known as:  REGLAN   oxyCODONE-acetaminophen 5-325 MG tablet Commonly known as:  PERCOCET/ROXICET   potassium chloride SA 20 MEQ tablet Commonly known as:  KLOR-CON M20   promethazine 12.5 MG tablet Commonly known as:  PHENERGAN   promethazine 25 MG suppository Commonly known as:  PHENERGAN     TAKE these medications   amitriptyline 75 MG tablet Commonly known as:  ELAVIL Take 75 mg by mouth at  bedtime.   dicyclomine 20 MG tablet Commonly known as:  BENTYL Take 1 tablet (20 mg total) by mouth 2 (two) times daily.   EXCEDRIN PO Take 1 tablet by mouth.   gabapentin 600 MG tablet Commonly known as:  NEURONTIN Take 1 tablet (600 mg total) by mouth 3 (three) times daily. What changed:  when to take this   levothyroxine 100 MCG tablet Commonly known as:  SYNTHROID, LEVOTHROID Take 300 mcg by mouth daily before breakfast.   levothyroxine 25 MCG tablet Commonly known as:   SYNTHROID, LEVOTHROID Take 25 mcg by mouth daily before breakfast. Take with 399mcg to = 337mcg total   meclizine 25 MG tablet Commonly known as:  ANTIVERT Take 1 tablet (25 mg total) by mouth 3 (three) times daily as needed.   ondansetron 4 MG disintegrating tablet Commonly known as:  ZOFRAN ODT Take 1 tablet (4 mg total) by mouth every 8 (eight) hours as needed for nausea or vomiting.   traMADol 50 MG tablet Commonly known as:  ULTRAM Take 50 mg by mouth 2 (two) times daily.         DISCHARGE INSTRUCTIONS:   DIET:  Full Liquid and as advance as tolerated.   DISCHARGE CONDITION:  Stable  ACTIVITY:  Activity as tolerated  OXYGEN:  Home Oxygen: No.   Oxygen Delivery: room air  DISCHARGE LOCATION:  home   If you experience worsening of your admission symptoms, develop shortness of breath, life threatening emergency, suicidal or homicidal thoughts you must seek medical attention immediately by calling 911 or calling your MD immediately  if symptoms less severe.  You Must read complete instructions/literature along with all the possible adverse reactions/side effects for all the Medicines you take and that have been prescribed to you. Take any new Medicines after you have completely understood and accpet all the possible adverse reactions/side effects.   Please note  You were cared for by a hospitalist during your hospital stay. If you have any questions about your discharge medications or the care you received while you were in the hospital after you are discharged, you can call the unit and asked to speak with the hospitalist on call if the hospitalist that took care of you is not available. Once you are discharged, your primary care physician will handle any further medical issues. Please note that NO REFILLS for any discharge medications will be authorized once you are discharged, as it is imperative that you return to your primary care physician (or establish a  relationship with a primary care physician if you do not have one) for your aftercare needs so that they can reassess your need for medications and monitor your lab values.     Today   Abdominal pain improved.  No N/V. Tolerating full liquid diet well.   VITAL SIGNS:  Blood pressure 137/84, pulse (!) 119, temperature 97.5 F (36.4 C), temperature source Oral, resp. rate 18, height 5\' 1"  (1.549 m), weight 61.2 kg (135 lb), SpO2 100 %.  I/O:   Intake/Output Summary (Last 24 hours) at 05/08/16 1348 Last data filed at 05/08/16 1142  Gross per 24 hour  Intake           2815.5 ml  Output              275 ml  Net           2540.5 ml    PHYSICAL EXAMINATION:  GENERAL:  51 y.o.-year-old patient lying in the bed with no  acute distress.  EYES: Pupils equal, round, reactive to light and accommodation. No scleral icterus. Extraocular muscles intact.  HEENT: Head atraumatic, normocephalic. Oropharynx and nasopharynx clear.  NECK:  Supple, no jugular venous distention. No thyroid enlargement, no tenderness.  LUNGS: Normal breath sounds bilaterally, no wheezing, rales,rhonchi. No use of accessory muscles of respiration.  CARDIOVASCULAR: S1, S2 normal. No murmurs, rubs, or gallops.  ABDOMEN: Soft, non-tender, non-distended. Bowel sounds present. No organomegaly or mass.  EXTREMITIES: No pedal edema, cyanosis, or clubbing.  NEUROLOGIC: Cranial nerves II through XII are intact. No focal motor or sensory defecits b/l.  PSYCHIATRIC: The patient is alert and oriented x 3. Anxious.  SKIN: No obvious rash, lesion, or ulcer.   DATA REVIEW:   CBC  Recent Labs Lab 05/08/16 0413  WBC 4.6  HGB 8.6*  HCT 26.0*  PLT 309    Chemistries   Recent Labs Lab 05/07/16 0947 05/08/16 0413  NA 141 139  K 4.0 3.2*  CL 109 109  CO2 22 23  GLUCOSE 99 113*  BUN 14 10  CREATININE 1.03* 0.76  CALCIUM 9.1 8.1*  AST 25  --   ALT 13*  --   ALKPHOS 152*  --   BILITOT 0.5  --     Cardiac Enzymes No  results for input(s): TROPONINI in the last 168 hours.  Microbiology Results  Results for orders placed or performed during the hospital encounter of 12/14/15  Urine culture     Status: None   Collection Time: 12/27/15  5:00 PM  Result Value Ref Range Status   Specimen Description URINE, RANDOM  Final   Special Requests NONE  Final   Culture NO GROWTH Performed at [facility]   Final   Report Status 12/29/2015 FINAL  Final    RADIOLOGY:  Dg Abd 2 Views  Result Date: 05/07/2016 CLINICAL DATA:  Abdominal pain and nausea. EXAM: ABDOMEN - 2 VIEW COMPARISON:  Radiographs dated 12/20/2015 FINDINGS: Power port in place. The bowel gas pattern is normal with no free air or free fluid. No dilated loops of bowel. Prior cholecystectomy. Surgical clip in the left lower quadrant. Bones are normal. IMPRESSION: Benign-appearing abdomen. Electronically Signed   By: Lorriane Shire M.D.   On: 05/07/2016 10:39      Management plans discussed with the patient, family and they are in agreement.  CODE STATUS:     Code Status Orders        Start     Ordered   05/07/16 1356  Full code  Continuous     05/07/16 1357      TOTAL TIME TAKING CARE OF THIS PATIENT: 40 minutes.    Henreitta Leber M.D on 05/08/2016 at 1:48 PM  Between 7am to 6pm - Pager - [phone]  After 6pm go to [email or web address] - Proofreader  [facility] [place] [facility]  Office  [phone]  CC: Primary care physician; [name], MD

## 2016-05-10 ENCOUNTER — Encounter: Payer: Self-pay | Admitting: Emergency Medicine

## 2016-05-10 ENCOUNTER — Emergency Department: Payer: Medicare Other

## 2016-05-10 ENCOUNTER — Emergency Department
Admission: EM | Admit: 2016-05-10 | Discharge: 2016-05-10 | Disposition: A | Discharge status: Home / Self Care | Payer: Medicare Other | Attending: Emergency Medicine | Admitting: Emergency Medicine

## 2016-05-10 DIAGNOSIS — Z96653 Presence of artificial knee joint, bilateral: Secondary | ICD-10-CM | POA: Insufficient documentation

## 2016-05-10 DIAGNOSIS — E876 Hypokalemia: Secondary | ICD-10-CM | POA: Diagnosis not present

## 2016-05-10 DIAGNOSIS — Z7982 Long term (current) use of aspirin: Secondary | ICD-10-CM | POA: Diagnosis not present

## 2016-05-10 DIAGNOSIS — M79605 Pain in left leg: Secondary | ICD-10-CM

## 2016-05-10 DIAGNOSIS — M79662 Pain in left lower leg: Secondary | ICD-10-CM | POA: Insufficient documentation

## 2016-05-10 DIAGNOSIS — E039 Hypothyroidism, unspecified: Secondary | ICD-10-CM | POA: Diagnosis not present

## 2016-05-10 DIAGNOSIS — R252 Cramp and spasm: Secondary | ICD-10-CM

## 2016-05-10 DIAGNOSIS — Z79899 Other long term (current) drug therapy: Secondary | ICD-10-CM | POA: Diagnosis not present

## 2016-05-10 LAB — URINALYSIS, ROUTINE W REFLEX MICROSCOPIC
Bilirubin Urine: NEGATIVE
GLUCOSE, UA: NEGATIVE mg/dL
Hgb urine dipstick: NEGATIVE
Ketones, ur: NEGATIVE mg/dL
Nitrite: POSITIVE — AB
PROTEIN: NEGATIVE mg/dL
Specific Gravity, Urine: 1.01 (ref 1.005–1.030)
pH: 6 (ref 5.0–8.0)

## 2016-05-10 LAB — CBC WITH DIFFERENTIAL/PLATELET
BASOS ABS: 0 10*3/uL (ref 0–0.1)
Basophils Relative: 0 %
EOS PCT: 0 %
Eosinophils Absolute: 0 10*3/uL (ref 0–0.7)
HCT: 26.1 % — ABNORMAL LOW (ref 35.0–47.0)
Hemoglobin: 8.8 g/dL — ABNORMAL LOW (ref 12.0–16.0)
LYMPHS ABS: 0.4 10*3/uL — AB (ref 1.0–3.6)
LYMPHS PCT: 7 %
MCH: 30.5 pg (ref 26.0–34.0)
MCHC: 33.6 g/dL (ref 32.0–36.0)
MCV: 90.6 fL (ref 80.0–100.0)
MONO ABS: 0.3 10*3/uL (ref 0.2–0.9)
Monocytes Relative: 5 %
NEUTROS ABS: 5.2 10*3/uL (ref 1.4–6.5)
Neutrophils Relative %: 88 %
Platelets: 275 10*3/uL (ref 150–440)
RBC: 2.88 MIL/uL — AB (ref 3.80–5.20)
RDW: 16.5 % — ABNORMAL HIGH (ref 11.5–14.5)
WBC: 6 10*3/uL (ref 3.6–11.0)

## 2016-05-10 LAB — BASIC METABOLIC PANEL
ANION GAP: 6 (ref 5–15)
BUN: 10 mg/dL (ref 6–20)
CO2: 25 mmol/L (ref 22–32)
Calcium: 8.4 mg/dL — ABNORMAL LOW (ref 8.9–10.3)
Chloride: 107 mmol/L (ref 101–111)
Creatinine, Ser: 0.76 mg/dL (ref 0.44–1.00)
GFR calc Af Amer: >60 mL/min (ref >60)
Glucose, Bld: 94 mg/dL (ref 65–99)
POTASSIUM: 2.9 mmol/L — AB (ref 3.5–5.1)
SODIUM: 138 mmol/L (ref 135–145)

## 2016-05-10 MED ORDER — CYCLOBENZAPRINE HCL 5 MG PO TABS: 5.0000 mg | ORAL_TABLET | Freq: Three times a day (TID) | ORAL | 0 refills | Status: DC | PRN

## 2016-05-10 MED ORDER — KETOROLAC TROMETHAMINE 30 MG/ML IJ SOLN
30.0000 mg | Freq: Once | INTRAMUSCULAR | Status: AC
  Administered 2016-05-10: 30 mg via INTRAVENOUS
  Filled 2016-05-10: qty 1

## 2016-05-10 MED ORDER — ORPHENADRINE CITRATE 30 MG/ML IJ SOLN
60.0000 mg | INTRAMUSCULAR | Status: AC
  Administered 2016-05-10: 60 mg via INTRAVENOUS
  Filled 2016-05-10: qty 2

## 2016-05-10 MED ORDER — OXYCODONE-ACETAMINOPHEN 5-325 MG PO TABS
1.0000 | ORAL_TABLET | Freq: Once | ORAL | Status: AC
  Administered 2016-05-10: 1 via ORAL
  Filled 2016-05-10: qty 1

## 2016-05-10 MED ORDER — HEPARIN SOD (PORK) LOCK FLUSH 10 UNIT/ML IV SOLN
INTRAVENOUS | Status: AC
  Filled 2016-05-10: qty 1

## 2016-05-10 MED ORDER — GABAPENTIN 300 MG PO CAPS: 600.0000 mg | ORAL_CAPSULE | Freq: Two times a day (BID) | ORAL | 0 refills | Status: DC

## 2016-05-10 MED ORDER — ORPHENADRINE CITRATE 30 MG/ML IJ SOLN: 60.0000 mg | INTRAMUSCULAR | Status: DC

## 2016-05-10 MED ORDER — KETOROLAC TROMETHAMINE 60 MG/2ML IM SOLN: 30.0000 mg | Freq: Once | INTRAMUSCULAR | Status: DC

## 2016-05-10 MED ORDER — OXYCODONE-ACETAMINOPHEN 5-325 MG PO TABS: 1.0000 | ORAL_TABLET | Freq: Three times a day (TID) | ORAL | 0 refills | Status: DC | PRN

## 2016-05-10 MED ORDER — POTASSIUM CHLORIDE CRYS ER 20 MEQ PO TBCR
20.0000 meq | EXTENDED_RELEASE_TABLET | Freq: Once | ORAL | Status: AC
  Administered 2016-05-10: 20 meq via ORAL
  Filled 2016-05-10: qty 1

## 2016-05-10 NOTE — Discharge Instructions (Signed)
Your exam, ultrasound, and labs are essentially normal. You do have low potassium in your labs today. You have been given a single dose of potassium here in the ED to manage it. Take the pain medicine as needed and take the muscle relaxant and Gabapentin as directed. Apply moist heat to the leg to reduce symptoms. Follow-up with Dr. Hoy Morn on Monday, as scheduled.

## 2016-05-10 NOTE — ED Notes (Signed)
Pt discharged home after verbalizing understanding of discharge instructions; nad noted. 

## 2016-05-10 NOTE — ED Notes (Signed)
Presents with pain to left knee ,which started couple of days ago  Now having pain across lower back  Ambulates with walker lately.. Denies any n/v/d/ or urinary sxs'

## 2016-05-10 NOTE — ED Triage Notes (Addendum)
Patient to ER for c/o pain to left calf, as well as lower back. Patient denies any injury to either. States back pain feels like her typical back pain, just worse in intensity. Patient states she is able to walk on leg, just has pain with walking that increases with walking. Patient has a walker that she uses as needed at home, has been using d/t leg pain. Denies urinary symptoms or fevers.

## 2016-05-10 NOTE — ED Notes (Signed)
Deaccessed port after flushing with heparin

## 2016-05-10 NOTE — ED Provider Notes (Signed)
[facility] Emergency Department Provider Note ____________________________________________  Time seen: 1028  I have reviewed the triage vital signs and the nursing notes.  HISTORY  Chief Complaint  Back Pain and Leg Pain  HPI Adrienne Flores is a 51 y.o. female with a  History of chronic abdominal and back pain and DDD, presents to the ED for evaluation of left calf pain for the last 3-4 days. She is noting some left calf pain that is increased with walking.  She denies any recent injury, accident, or trauma.  She has a walker she uses to ambulate at home. She denies chest pain, SOB, incontinence, leg weakness, or distal paresthesias. She denies any swelling, warmth, redness, or skin changes to the leg.   Past Medical History:  Diagnosis Date  . At high risk for falls   . Chronic abdominal pain 2008   dates back to at least 2008.  multiple ultrasound, CT scans, x rays at [facility], [place].  [facility] colonoscopy, egd unrevealing.    . Colitis 2015   ? colitis per CTs in 2015, 2016 and 11/2015.  presumed infectious.  colonoscopy in 2015 negative for colitis.   . Constipation   . Cyclic vomiting syndrome 2015   per opinion  of Dr Granville Lewis, IBS specialist at [facility].   . Degenerative disc disease   . Depression   . Fibromyalgia   . Gastroparesis 03/2011   markedly abnormal gastric emptying study.   . Hematemesis 11/2015   mild to moderate blood in emesis appeared after 2 plus weeks of frequent, non-bloody emesis.   [name] Hemorrhoids   . Hemorrhoids   . History of chicken pox   . Hypokalemia   . Hypothyroid   . Lupus   . 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 Rheumatoid arthritis [facility])     Patient Active Problem List   Diagnosis Date Noted  . Protein-calorie malnutrition, severe 12/12/2015  . Symptomatic anemia 12/09/2015  . Acute hyponatremia 12/09/2015  . Hematemesis 12/09/2015  . Chronic hoarseness 12/09/2015  . Unintended weight loss 12/09/2015  . Rheu arthrit of  right ank/ft w involv of organs and systems ([place]) 06/16/2014  . DDD (degenerative disc disease), cervical 06/16/2014  . 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 DDD (degenerative disc disease), lumbosacral 06/16/2014  . Chronic abdominal and back  pain 07/26/2013  . Nausea and vomiting 07/26/2013  . 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 Rheumatoid arthritis ([place]) 07/26/2013  . UTI (lower urinary tract infection) 07/26/2013  . Uncontrolled pain 07/26/2013  . Acute on chronic renal failure ([place]) 06/27/2013  . Colitis, infectious 06/25/2013  . AKI (acute kidney injury) ([place]) 06/25/2013  . Transaminitis 06/25/2013  . Infectious colitis 06/25/2013  . Abdominal pain 11/26/2012  . Hypothyroidism 11/26/2012    Past Surgical History:  Procedure Laterality Date  . ABDOMINAL HYSTERECTOMY     total  . CESAREAN SECTION     x2  . CHOLECYSTECTOMY    . COLONOSCOPY  11/2013   at [facility] Dr Kennith Gain. Hemorrhoids. sigmoid tics. random biopsies negative for microscopic colitis or any pathology.   . ESOPHAGOGASTRODUODENOSCOPY  11/2013   Dr Kennith Gain at [facility].  Normal study.  Duodenal bx negative for villous atrophy.   . ESOPHAGOGASTRODUODENOSCOPY N/A 12/09/2015   Procedure: ESOPHAGOGASTRODUODENOSCOPY (EGD);  Surgeon: Doran Stabler, MD;  Location: [facility] ENDOSCOPY;  Service: Endoscopy;  Laterality: N/A;  . KNEE SURGERY Left   . OOPHORECTOMY    . REPLACEMENT TOTAL KNEE Bilateral  Prior to Admission medications   Medication Sig Start Date End Date Taking? Authorizing Provider  amitriptyline (ELAVIL) 75 MG tablet Take 75 mg by mouth at bedtime.    Historical Provider, MD  Aspirin-Acetaminophen-Caffeine (EXCEDRIN PO) Take 1 tablet by mouth.    Historical Provider, MD  cyclobenzaprine (FLEXERIL) 5 MG tablet Take 1 tablet (5 mg total) by mouth 3 (three) times daily as needed for muscle spasms. 05/10/16   Derrich Gaby V Bacon Dvante Hands, PA-C  dicyclomine (BENTYL) 20 MG tablet Take 1 tablet (20 mg total) by mouth  2 (two) times daily. 05/08/16   Henreitta Leber, MD  gabapentin (NEURONTIN) 300 MG capsule Take 2 capsules (600 mg total) by mouth 2 (two) times daily. 05/10/16 06/09/16  Florice Hindle V Bacon Rachel Rison, PA-C  gabapentin (NEURONTIN) 600 MG tablet Take 1 tablet (600 mg total) by mouth 3 (three) times daily. Patient taking differently: Take 600 mg by mouth 2 (two) times daily.  12/14/15   Bonnielee Haff, MD  levothyroxine (SYNTHROID, LEVOTHROID) 100 MCG tablet Take 300 mcg by mouth daily before breakfast.     Historical Provider, MD  levothyroxine (SYNTHROID, LEVOTHROID) 25 MCG tablet Take 25 mcg by mouth daily before breakfast. Take with 368mcg to = 366mcg total    Historical Provider, MD  meclizine (ANTIVERT) 25 MG tablet Take 1 tablet (25 mg total) by mouth 3 (three) times daily as needed. Patient not taking: Reported on 02/09/2016 09/11/13   Dalia Heading, PA-C  ondansetron (ZOFRAN ODT) 4 MG disintegrating tablet Take 1 tablet (4 mg total) by mouth every 8 (eight) hours as needed for nausea or vomiting. 05/08/16   Henreitta Leber, MD  oxyCODONE-acetaminophen (ROXICET) 5-325 MG tablet Take 1 tablet by mouth every 8 (eight) hours as needed. 05/10/16   Jaymin Waln V Bacon Stanislawa Gaffin, PA-C  traMADol (ULTRAM) 50 MG tablet Take 50 mg by mouth 2 (two) times daily.    Historical Provider, MD    Allergies Methotrexate derivatives; Morphine and related; and Relafen [nabumetone]  Family History  Problem Relation Age of Onset  . Leukemia Mother   . Arthritis Mother   . Early death Mother   . Miscarriages / [place] Mother   . Hypothyroidism Sister   . Cancer Paternal Grandmother   . Cancer Paternal Grandfather     Social History Social History  Substance Use Topics  . Smoking status: Never Smoker  . Smokeless tobacco: Never Used  . Alcohol use Yes     Comment: social    Review of Systems  Constitutional: Negative for fever. Cardiovascular: Negative for chest pain. Respiratory: Negative for shortness of  breath. Gastrointestinal: Negative for abdominal pain, vomiting and diarrhea. Genitourinary: Negative for dysuria. Musculoskeletal: Positive for back pain. Left calf pain as above.  Skin: Negative for rash. Neurological: Negative for headaches, focal weakness or numbness. ____________________________________________  PHYSICAL EXAM:  VITAL SIGNS: ED Triage Vitals  Enc Vitals Group     BP 05/10/16 1016 139/81     Pulse Rate 05/10/16 1016 93     Resp 05/10/16 1016 20     Temp 05/10/16 1016 99.2 F (37.3 C)     Temp Source 05/10/16 1016 Oral     SpO2 05/10/16 1016 100 %     Weight 05/10/16 1016 133 lb (60.3 kg)     Height 05/10/16 1016 5\' 1"  (1.549 m)     Head Circumference --      Peak Flow --      Pain Score 05/10/16 1015 9  Pain Loc --      Pain Edu? --      Excl. in [place]? --     Constitutional: Alert and oriented. Well appearing and in no distress. Head: Normocephalic and atraumatic. Cardiovascular: Normal rate, regular rhythm. Normal distal pulses. Normal cap refill. Right subclavian Port-a-cath noted to upper chest.  Respiratory: Normal respiratory effort. No wheezes/rales/rhonchi. Gastrointestinal: Soft and nontender. No distention, rebound, guarding, organomegaly, or distention. Normal bowel sounds.  Musculoskeletal: Right knee s/p total arthoplasty. No effusion, edema, or deformity. Normal ROM. No popliteal space fullness, tenderness. Tender to palp along the belly of the right gastrocnemius. No achilles tenderness. Normal ankle ROM. Nontender with normal range of motion in all extremities.  Neurologic:  Normal gait without ataxia. Normal speech and language. No gross focal neurologic deficits are appreciated. Skin:  Skin is warm, dry and intact. No rash noted. Psychiatric: Mood and affect are normal. Patient exhibits appropriate insight and judgment. ____________________________________________   LABS (pertinent positives/negatives)  Labs Reviewed  URINALYSIS,  ROUTINE W REFLEX MICROSCOPIC - Abnormal; Notable for the following:       Result Value   Color, Urine YELLOW (*)    APPearance HAZY (*)    Nitrite POSITIVE (*)    Leukocytes, UA MODERATE (*)    Bacteria, UA MANY (*)    Squamous Epithelial / LPF 6-30 (*)    All other components within normal limits  BASIC METABOLIC PANEL - Abnormal; Notable for the following:    Potassium 2.9 (*)    Calcium 8.4 (*)    All other components within normal limits  CBC WITH DIFFERENTIAL/PLATELET - Abnormal; Notable for the following:    RBC 2.88 (*)    Hemoglobin 8.8 (*)    HCT 26.1 (*)    RDW 16.5 (*)    Lymphs Abs 0.4 (*)    All other components within normal limits  URINE CULTURE  ____________________________________________   RADIOLOGY  LLE US/Doppler  IMPRESSION: No evidence of deep venous thrombosis. ____________________________________________  PROCEDURES  Toradol 30 mg IV - via port Norflex 60 mg IV - via port Potassium 20 meq PO ____________________________________________  INITIAL IMPRESSION / ASSESSMENT AND PLAN / ED COURSE  Patient with left calf pain and cramping on presentation. Her exam is benign and her [place] does not show any DVT. Her labs show a hypokalemia, which has been transient in the past labs. She may be experiencing muscle pain due to electrolyte imbalance. She is discharged with prescriptions for Gabapentin (which she admittedly takes PRN), Flexeril, and Norco (#15). She is advised to keep an appointment with Dr. Hoy Morn for Tuesday, for ongoing medical care.  ____________________________________________  FINAL CLINICAL IMPRESSION(S) / ED DIAGNOSES  Final diagnoses:  Left leg pain  Cramps of left lower extremity  Hypokalemia     Melvenia Needles, PA-C 05/10/16 1445    Eula Listen, MD 05/10/16 [phone]

## 2016-05-12 LAB — URINE CULTURE
Culture: >=100000 — AB
Special Requests: NORMAL

## 2016-05-14 NOTE — Progress Notes (Signed)
51 y/o F d/c from ED 3/29 with leg pain. Urine culture returned with E coli. Patient was not d/c on abx. Per Dr. Kerman Passey, will treat patient with Keflex 500 mg bid x 7 days. Prescription called in to [facility] in [place] as per patient request.   Ulice Dash, PharmD Clinical Pharmacist

## 2016-05-29 ENCOUNTER — Encounter: Payer: Self-pay | Admitting: Emergency Medicine

## 2016-05-29 ENCOUNTER — Emergency Department
Admission: EM | Admit: 2016-05-29 | Discharge: 2016-05-29 | Disposition: A | Discharge status: Home / Self Care | Payer: Medicare Other | Attending: Student in an Organized Health Care Education/Training Program | Admitting: Student in an Organized Health Care Education/Training Program

## 2016-05-29 DIAGNOSIS — E039 Hypothyroidism, unspecified: Secondary | ICD-10-CM | POA: Diagnosis not present

## 2016-05-29 DIAGNOSIS — Z79899 Other long term (current) drug therapy: Secondary | ICD-10-CM | POA: Insufficient documentation

## 2016-05-29 DIAGNOSIS — R1030 Lower abdominal pain, unspecified: Secondary | ICD-10-CM

## 2016-05-29 DIAGNOSIS — N3090 Cystitis, unspecified without hematuria: Secondary | ICD-10-CM | POA: Diagnosis not present

## 2016-05-29 DIAGNOSIS — R1084 Generalized abdominal pain: Secondary | ICD-10-CM | POA: Diagnosis present

## 2016-05-29 LAB — CBC WITH DIFFERENTIAL/PLATELET
BASOS ABS: 0.1 10*3/uL (ref 0–0.1)
BASOS PCT: 1 %
Eosinophils Absolute: 0.1 10*3/uL (ref 0–0.7)
Eosinophils Relative: 1 %
HEMATOCRIT: 32.9 % — AB (ref 35.0–47.0)
HEMOGLOBIN: 10.8 g/dL — AB (ref 12.0–16.0)
Lymphocytes Relative: 19 %
Lymphs Abs: 1.2 10*3/uL (ref 1.0–3.6)
MCH: 27.7 pg (ref 26.0–34.0)
MCHC: 32.7 g/dL (ref 32.0–36.0)
MCV: 84.5 fL (ref 80.0–100.0)
Monocytes Absolute: 0.6 10*3/uL (ref 0.2–0.9)
Monocytes Relative: 10 %
NEUTROS ABS: 4.3 10*3/uL (ref 1.4–6.5)
NEUTROS PCT: 69 %
Platelets: 449 10*3/uL — ABNORMAL HIGH (ref 150–440)
RBC: 3.89 MIL/uL (ref 3.80–5.20)
RDW: 16.1 % — AB (ref 11.5–14.5)
WBC: 6.2 10*3/uL (ref 3.6–11.0)

## 2016-05-29 LAB — COMPREHENSIVE METABOLIC PANEL
ALBUMIN: 3.7 g/dL (ref 3.5–5.0)
ALT: 8 U/L — AB (ref 14–54)
ANION GAP: 11 (ref 5–15)
AST: 20 U/L (ref 15–41)
Alkaline Phosphatase: 118 U/L (ref 38–126)
BUN: 11 mg/dL (ref 6–20)
CALCIUM: 9.3 mg/dL (ref 8.9–10.3)
CHLORIDE: 104 mmol/L (ref 101–111)
CO2: 24 mmol/L (ref 22–32)
CREATININE: 0.85 mg/dL (ref 0.44–1.00)
GFR calc Af Amer: >60 mL/min (ref >60)
Glucose, Bld: 88 mg/dL (ref 65–99)
Potassium: 3.6 mmol/L (ref 3.5–5.1)
Sodium: 139 mmol/L (ref 135–145)
Total Bilirubin: 0.5 mg/dL (ref 0.3–1.2)
Total Protein: 8.1 g/dL (ref 6.5–8.1)

## 2016-05-29 LAB — URINALYSIS, COMPLETE (UACMP) WITH MICROSCOPIC
Bilirubin Urine: NEGATIVE
GLUCOSE, UA: NEGATIVE mg/dL
HGB URINE DIPSTICK: NEGATIVE
Ketones, ur: NEGATIVE mg/dL
Leukocytes, UA: NEGATIVE
NITRITE: POSITIVE — AB
PH: 6 (ref 5.0–8.0)
PROTEIN: NEGATIVE mg/dL
Specific Gravity, Urine: 1.01 (ref 1.005–1.030)

## 2016-05-29 LAB — LIPASE, BLOOD: LIPASE: 16 U/L (ref 11–51)

## 2016-05-29 MED ORDER — CEPHALEXIN 500 MG PO CAPS: 500.0000 mg | ORAL_CAPSULE | Freq: Three times a day (TID) | ORAL | 0 refills | Status: AC

## 2016-05-29 MED ORDER — ACETAMINOPHEN 500 MG PO TABS
1000.0000 mg | ORAL_TABLET | Freq: Once | ORAL | Status: AC
  Administered 2016-05-29: 1000 mg via ORAL

## 2016-05-29 MED ORDER — HEPARIN SOD (PORK) LOCK FLUSH 10 UNIT/ML IV SOLN
INTRAVENOUS | Status: AC
  Filled 2016-05-29: qty 1

## 2016-05-29 MED ORDER — PROMETHAZINE HCL 25 MG/ML IJ SOLN
25.0000 mg | Freq: Once | INTRAMUSCULAR | Status: AC
  Administered 2016-05-29: 25 mg via INTRAVENOUS
  Filled 2016-05-29: qty 1

## 2016-05-29 MED ORDER — OLANZAPINE 10 MG IM SOLR
10.0000 mg | Freq: Once | INTRAMUSCULAR | Status: AC
  Administered 2016-05-29: 10 mg via INTRAMUSCULAR
  Filled 2016-05-29 (×2): qty 10

## 2016-05-29 MED ORDER — CEPHALEXIN 500 MG PO CAPS
500.0000 mg | ORAL_CAPSULE | Freq: Once | ORAL | Status: AC
  Administered 2016-05-29: 500 mg via ORAL
  Filled 2016-05-29: qty 1

## 2016-05-29 MED ORDER — ACETAMINOPHEN 500 MG PO TABS
ORAL_TABLET | ORAL | Status: AC
  Administered 2016-05-29: 1000 mg via ORAL
  Filled 2016-05-29: qty 2

## 2016-05-29 MED ORDER — LORAZEPAM 2 MG/ML IJ SOLN: 1.0000 mg | Freq: Once | INTRAMUSCULAR | Status: DC

## 2016-05-29 NOTE — Discharge Instructions (Signed)

## 2016-05-29 NOTE — ED Provider Notes (Signed)
[name] Emergency Department Provider Note    First MD Initiated Contact with Patient 05/29/16 [phone]     (approximate)  I have reviewed the triage vital signs and the nursing notes.   HISTORY  Chief Complaint Abdominal Pain    HPI Adrienne Flores is a 51 y.o. female with roughly 10 year history of chronic abdominal pain presents for similar symptoms that started yesterday. States that she's having diffuse periumbilical abdominal pain associated with nausea vomiting. Patient tearful and states that she cannot handle abdominal pain. Had recent admission at the end of the month for similar symptoms. As with previous admissions, etiology of abdominal pain remained elusive.  Denies any recent fevers.  Denies any chest pain or shortness of breath. No dysuria. No flank pain.   Past Medical History:  Diagnosis Date  . At high risk for falls   . Chronic abdominal pain 2008   dates back to at least 2008.  multiple ultrasound, CT scans, x rays at [facility], [place].  [facility] colonoscopy, egd unrevealing.    . Colitis 2015   ? colitis per CTs in 2015, 2016 and 11/2015.  presumed infectious.  colonoscopy in 2015 negative for colitis.   . Constipation   . Cyclic vomiting syndrome 2015   per opinion  of Dr Granville Lewis, IBS specialist at [facility].   . Degenerative disc disease   . Depression   . Fibromyalgia   . Gastroparesis 03/2011   markedly abnormal gastric emptying study.   . Hematemesis 11/2015   mild to moderate blood in emesis appeared after 2 plus weeks of frequent, non-bloody emesis.   [name] Hemorrhoids   . Hemorrhoids   . History of chicken pox   . Hypokalemia   . Hypothyroid   . Lupus   . 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 Rheumatoid arthritis ([place])    Family History  Problem Relation Age of Onset  . Leukemia Mother   . Arthritis Mother   . Early death Mother   . Miscarriages / [place] Mother   . Hypothyroidism Sister   . Cancer Paternal Grandmother     . Cancer Paternal Grandfather    Past Surgical History:  Procedure Laterality Date  . ABDOMINAL HYSTERECTOMY     total  . CESAREAN SECTION     x2  . CHOLECYSTECTOMY    . COLONOSCOPY  11/2013   at [facility] Dr Kennith Gain. Hemorrhoids. sigmoid tics. random biopsies negative for microscopic colitis or any pathology.   . ESOPHAGOGASTRODUODENOSCOPY  11/2013   Dr Kennith Gain at [facility].  Normal study.  Duodenal bx negative for villous atrophy.   . ESOPHAGOGASTRODUODENOSCOPY N/A 12/09/2015   Procedure: ESOPHAGOGASTRODUODENOSCOPY (EGD);  Surgeon: Doran Stabler, MD;  Location: [facility] ENDOSCOPY;  Service: Endoscopy;  Laterality: N/A;  . KNEE SURGERY Left   . OOPHORECTOMY    . REPLACEMENT TOTAL KNEE Bilateral    Patient Active Problem List   Diagnosis Date Noted  . Protein-calorie malnutrition, severe 12/12/2015  . Symptomatic anemia 12/09/2015  . Acute hyponatremia 12/09/2015  . Hematemesis 12/09/2015  . Chronic hoarseness 12/09/2015  . Unintended weight loss 12/09/2015  . Rheu arthrit of right ank/ft w involv of organs and systems ([place]) 06/16/2014  . DDD (degenerative disc disease), cervical 06/16/2014  . 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 DDD (degenerative disc disease), lumbosacral 06/16/2014  . Chronic abdominal and back  pain 07/26/2013  . Nausea and vomiting 07/26/2013  . 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 Rheumatoid  arthritis ([place]) 07/26/2013  . UTI (lower urinary tract infection) 07/26/2013  . Uncontrolled pain 07/26/2013  . Acute on chronic renal failure ([place]) 06/27/2013  . Colitis, infectious 06/25/2013  . AKI (acute kidney injury) ([place]) 06/25/2013  . Transaminitis 06/25/2013  . Infectious colitis 06/25/2013  . Abdominal pain 11/26/2012  . Hypothyroidism 11/26/2012      Prior to Admission medications   Medication Sig Start Date End Date Taking? Authorizing Provider  amitriptyline (ELAVIL) 75 MG tablet Take 75 mg by mouth at bedtime.    Historical Provider, MD   Aspirin-Acetaminophen-Caffeine (EXCEDRIN PO) Take 1 tablet by mouth.    Historical Provider, MD  cephALEXin (KEFLEX) 500 MG capsule Take 1 capsule (500 mg total) by mouth 3 (three) times daily. 05/29/16 06/08/16  Merlyn Lot, MD  cephALEXin (KEFLEX) 500 MG capsule Take 1 capsule (500 mg total) by mouth 3 (three) times daily. 05/29/16 06/08/16  Merlyn Lot, MD  cyclobenzaprine (FLEXERIL) 5 MG tablet Take 1 tablet (5 mg total) by mouth 3 (three) times daily as needed for muscle spasms. 05/10/16   Jenise V Bacon Menshew, PA-C  dicyclomine (BENTYL) 20 MG tablet Take 1 tablet (20 mg total) by mouth 2 (two) times daily. 05/08/16   Henreitta Leber, MD  gabapentin (NEURONTIN) 300 MG capsule Take 2 capsules (600 mg total) by mouth 2 (two) times daily. 05/10/16 06/09/16  Jenise V Bacon Menshew, PA-C  gabapentin (NEURONTIN) 600 MG tablet Take 1 tablet (600 mg total) by mouth 3 (three) times daily. Patient taking differently: Take 600 mg by mouth 2 (two) times daily.  12/14/15   Bonnielee Haff, MD  levothyroxine (SYNTHROID, LEVOTHROID) 100 MCG tablet Take 300 mcg by mouth daily before breakfast.     Historical Provider, MD  levothyroxine (SYNTHROID, LEVOTHROID) 25 MCG tablet Take 25 mcg by mouth daily before breakfast. Take with 312mcg to = 372mcg total    Historical Provider, MD  meclizine (ANTIVERT) 25 MG tablet Take 1 tablet (25 mg total) by mouth 3 (three) times daily as needed. Patient not taking: Reported on 02/09/2016 09/11/13   Dalia Heading, PA-C  ondansetron (ZOFRAN ODT) 4 MG disintegrating tablet Take 1 tablet (4 mg total) by mouth every 8 (eight) hours as needed for nausea or vomiting. 05/08/16   Henreitta Leber, MD  oxyCODONE-acetaminophen (ROXICET) 5-325 MG tablet Take 1 tablet by mouth every 8 (eight) hours as needed. 05/10/16   Jenise V Bacon Menshew, PA-C  traMADol (ULTRAM) 50 MG tablet Take 50 mg by mouth 2 (two) times daily.    Historical Provider, MD    Allergies Methotrexate  derivatives; Morphine and related; and Relafen [nabumetone]    Social History Social History  Substance Use Topics  . Smoking status: Never Smoker  . Smokeless tobacco: Never Used  . Alcohol use Yes     Comment: social    Review of Systems Patient denies headaches, rhinorrhea, blurry vision, numbness, shortness of breath, chest pain, edema, cough, abdominal pain, nausea, vomiting, diarrhea, dysuria, fevers, rashes or hallucinations unless otherwise stated above in HPI. ____________________________________________   PHYSICAL EXAM:  VITAL SIGNS: Vitals:   05/29/16 1000 05/29/16 1030  BP: 132/75 (!) 141/71  Pulse: 71 73  Resp: 18 20  Temp:      Constitutional: Alert and oriented. Tearful and anxious appearing Eyes: Conjunctivae are normal. PERRL. EOMI. Head: Atraumatic. Nose: No congestion/rhinnorhea. Mouth/Throat: Mucous membranes are moist.  Oropharynx non-erythematous. Neck: No stridor. Painless ROM. No cervical spine tenderness to palpation Hematological/Lymphatic/Immunilogical: No cervical lymphadenopathy. Cardiovascular:  Normal rate, regular rhythm. Grossly normal heart sounds.  Good peripheral circulation. Respiratory: Normal respiratory effort.  No retractions. Lungs CTAB. Gastrointestinal: Soft and nontender. No distention. No abdominal bruits. No CVA tenderness. Genitourinary:  Musculoskeletal: No lower extremity tenderness nor edema.  No joint effusions. Neurologic:  Normal speech and language. No gross focal neurologic deficits are appreciated.  Skin:  Skin is warm, dry and intact. No rash noted.  ____________________________________________   LABS (all labs ordered are listed, but only abnormal results are displayed)  Results for orders placed or performed during the hospital encounter of 05/29/16 (from the past 24 hour(s))  CBC with Differential/Platelet     Status: Abnormal   Collection Time: 05/29/16  9:27 AM  Result Value Ref Range   WBC 6.2 3.6 -  11.0 K/uL   RBC 3.89 3.80 - 5.20 MIL/uL   Hemoglobin 10.8 (L) 12.0 - 16.0 g/dL   HCT 32.9 (L) 35.0 - 47.0 %   MCV 84.5 80.0 - 100.0 fL   MCH 27.7 26.0 - 34.0 pg   MCHC 32.7 32.0 - 36.0 g/dL   RDW 16.1 (H) 11.5 - 14.5 %   Platelets 449 (H) 150 - 440 K/uL   Neutrophils Relative % 69 %   Neutro Abs 4.3 1.4 - 6.5 K/uL   Lymphocytes Relative 19 %   Lymphs Abs 1.2 1.0 - 3.6 K/uL   Monocytes Relative 10 %   Monocytes Absolute 0.6 0.2 - 0.9 K/uL   Eosinophils Relative 1 %   Eosinophils Absolute 0.1 0 - 0.7 K/uL   Basophils Relative 1 %   Basophils Absolute 0.1 0 - 0.1 K/uL  Comprehensive metabolic panel     Status: Abnormal   Collection Time: 05/29/16  9:27 AM  Result Value Ref Range   Sodium 139 135 - 145 mmol/L   Potassium 3.6 3.5 - 5.1 mmol/L   Chloride 104 101 - 111 mmol/L   CO2 24 22 - 32 mmol/L   Glucose, Bld 88 65 - 99 mg/dL   BUN 11 6 - 20 mg/dL   Creatinine, Ser 0.85 0.44 - 1.00 mg/dL   Calcium 9.3 8.9 - 10.3 mg/dL   Total Protein 8.1 6.5 - 8.1 g/dL   Albumin 3.7 3.5 - 5.0 g/dL   AST 20 15 - 41 U/L   ALT 8 (L) 14 - 54 U/L   Alkaline Phosphatase 118 38 - 126 U/L   Total Bilirubin 0.5 0.3 - 1.2 mg/dL   GFR calc non Af Amer >60 >60 mL/min   GFR calc Af Amer >60 >60 mL/min   Anion gap 11 5 - 15  Lipase, blood     Status: None   Collection Time: 05/29/16  9:27 AM  Result Value Ref Range   Lipase 16 11 - 51 U/L  Urinalysis, Complete w Microscopic     Status: Abnormal   Collection Time: 05/29/16 11:22 AM  Result Value Ref Range   Color, Urine YELLOW (A) YELLOW   APPearance CLEAR (A) CLEAR   Specific Gravity, Urine 1.010 1.005 - 1.030   pH 6.0 5.0 - 8.0   Glucose, UA NEGATIVE NEGATIVE mg/dL   Hgb urine dipstick NEGATIVE NEGATIVE   Bilirubin Urine NEGATIVE NEGATIVE   Ketones, ur NEGATIVE NEGATIVE mg/dL   Protein, ur NEGATIVE NEGATIVE mg/dL   Nitrite POSITIVE (A) NEGATIVE   Leukocytes, UA NEGATIVE NEGATIVE   RBC / HPF 0-5 0 - 5 RBC/hpf   WBC, UA 0-5 0 - 5 WBC/hpf    Bacteria,  UA MANY (A) NONE SEEN   Squamous Epithelial / LPF 0-5 (A) NONE SEEN   Mucous PRESENT    ____________________________________________ ____________________________________________  RADIOLOGY   ____________________________________________   PROCEDURES  Procedure(s) performed:  Procedures    Critical Care performed: no ____________________________________________   INITIAL IMPRESSION / ASSESSMENT AND PLAN / ED COURSE  Pertinent labs & imaging results that were available during my care of the patient were reviewed by me and considered in my medical decision making (see chart for details).  DDX: chronic abdominal pain, colitis, cystitis, enteritis  Adrienne Flores is a 51 y.o. who presents to the ED with abdominal pain as described above.  Patient is AFVSS in ED. Exam as above. Given current presentation have considered the above differential.  Patient with chronic abdominal pain that is been ongoing for over 10 years. Abdominal exam is soft and benign. Patient tearful. Do suspect a large component versus psychosomatic. We'll check blood work. Do not feel that emergent CT imaging clinically indicated.  The patient will be placed on continuous pulse oximetry and telemetry for monitoring.  Laboratory evaluation will be sent to evaluate for the above complaints.     Clinical Course as of May 29 1149  Tue May 29, 2016  1150 Urinalysis does show evidence of acute cystitis. Patient's otherwise hemodynamically stable.  Patient was able to tolerate PO.  Have discussed with the patient and available family all diagnostics and treatments performed thus far and all questions were answered to the best of my ability. The patient demonstrates understanding and agreement with plan.   [PR]    Clinical Course User Index [PR] Merlyn Lot, MD     ____________________________________________   FINAL CLINICAL IMPRESSION(S) / ED DIAGNOSES  Final diagnoses:  Lower abdominal  pain  Cystitis      NEW MEDICATIONS STARTED DURING THIS VISIT:  New Prescriptions   CEPHALEXIN (KEFLEX) 500 MG CAPSULE    Take 1 capsule (500 mg total) by mouth 3 (three) times daily.   CEPHALEXIN (KEFLEX) 500 MG CAPSULE    Take 1 capsule (500 mg total) by mouth 3 (three) times daily.     Note:  This document was prepared using Dragon voice recognition software and may include unintentional dictation errors.    Merlyn Lot, MD 05/29/16 [phone]

## 2016-05-29 NOTE — ED Notes (Signed)
Port accessed, no blood return, flushes with swelling, redness or pain, per pt port has not been giving blood back the past few times it has been accessed, charge RN Nira Conn brought to bedside for evaluation

## 2016-05-29 NOTE — ED Triage Notes (Signed)
Pt to ed with c/o abd pain for the last 10 years, pt states pain started recently yesterday.  Pt reports n/v.  BM this am wnl.

## 2016-05-29 NOTE — ED Notes (Signed)
EDP at bedside  

## 2016-06-01 ENCOUNTER — Encounter: Payer: Self-pay | Admitting: Family Medicine

## 2016-06-12 ENCOUNTER — Inpatient Hospital Stay: Payer: Medicare Other | Admitting: Oncology

## 2016-06-12 ENCOUNTER — Inpatient Hospital Stay: Payer: Medicare Other

## 2016-07-02 ENCOUNTER — Encounter: Payer: Self-pay | Admitting: Oncology

## 2016-07-02 ENCOUNTER — Other Ambulatory Visit: Payer: Self-pay | Admitting: *Deleted

## 2016-07-02 ENCOUNTER — Inpatient Hospital Stay: Payer: Medicare Other

## 2016-07-02 ENCOUNTER — Inpatient Hospital Stay (HOSPITAL_BASED_OUTPATIENT_CLINIC_OR_DEPARTMENT_OTHER): Payer: Medicare Other | Admitting: Oncology

## 2016-07-02 ENCOUNTER — Inpatient Hospital Stay: Payer: Medicare Other | Attending: Oncology

## 2016-07-02 VITALS — BP 115/67 | HR 80 | Temp 96.7°F | Resp 18

## 2016-07-02 VITALS — BP 157/78 | HR 56 | Temp 98.3°F | Resp 18 | Ht 61.81 in | Wt 149.5 lb

## 2016-07-02 DIAGNOSIS — R634 Abnormal weight loss: Secondary | ICD-10-CM | POA: Diagnosis not present

## 2016-07-02 DIAGNOSIS — D72810 Lymphocytopenia: Secondary | ICD-10-CM

## 2016-07-02 DIAGNOSIS — Z7982 Long term (current) use of aspirin: Secondary | ICD-10-CM

## 2016-07-02 DIAGNOSIS — D649 Anemia, unspecified: Secondary | ICD-10-CM

## 2016-07-02 DIAGNOSIS — R5381 Other malaise: Secondary | ICD-10-CM | POA: Insufficient documentation

## 2016-07-02 DIAGNOSIS — Z79899 Other long term (current) drug therapy: Secondary | ICD-10-CM

## 2016-07-02 DIAGNOSIS — E039 Hypothyroidism, unspecified: Secondary | ICD-10-CM

## 2016-07-02 DIAGNOSIS — M069 Rheumatoid arthritis, unspecified: Secondary | ICD-10-CM

## 2016-07-02 DIAGNOSIS — D509 Iron deficiency anemia, unspecified: Secondary | ICD-10-CM | POA: Diagnosis not present

## 2016-07-02 DIAGNOSIS — M797 Fibromyalgia: Secondary | ICD-10-CM | POA: Diagnosis not present

## 2016-07-02 DIAGNOSIS — G8929 Other chronic pain: Secondary | ICD-10-CM | POA: Diagnosis not present

## 2016-07-02 DIAGNOSIS — E871 Hypo-osmolality and hyponatremia: Secondary | ICD-10-CM | POA: Insufficient documentation

## 2016-07-02 DIAGNOSIS — R5383 Other fatigue: Secondary | ICD-10-CM | POA: Diagnosis not present

## 2016-07-02 DIAGNOSIS — R109 Unspecified abdominal pain: Secondary | ICD-10-CM | POA: Diagnosis not present

## 2016-07-02 LAB — CBC
HEMATOCRIT: 30.8 % — AB (ref 35.0–47.0)
HEMOGLOBIN: 9.7 g/dL — AB (ref 12.0–16.0)
MCH: 25.7 pg — ABNORMAL LOW (ref 26.0–34.0)
MCHC: 31.5 g/dL — AB (ref 32.0–36.0)
MCV: 81.7 fL (ref 80.0–100.0)
Platelets: 351 10*3/uL (ref 150–440)
RBC: 3.77 MIL/uL — AB (ref 3.80–5.20)
RDW: 17.6 % — ABNORMAL HIGH (ref 11.5–14.5)
WBC: 6.3 10*3/uL (ref 3.6–11.0)

## 2016-07-02 LAB — IRON AND TIBC
Iron: 34 ug/dL (ref 28–170)
SATURATION RATIOS: 9 % — AB (ref 10.4–31.8)
TIBC: 374 ug/dL (ref 250–450)
UIBC: 340 ug/dL

## 2016-07-02 LAB — FOLATE: FOLATE: 9.6 ng/mL (ref >5.9)

## 2016-07-02 LAB — FERRITIN: FERRITIN: 14 ng/mL (ref 11–307)

## 2016-07-02 MED ORDER — HEPARIN SOD (PORK) LOCK FLUSH 100 UNIT/ML IV SOLN
500.0000 [IU] | Freq: Once | INTRAVENOUS | Status: AC | PRN
  Administered 2016-07-02: 500 [IU]

## 2016-07-02 MED ORDER — HEPARIN SOD (PORK) LOCK FLUSH 100 UNIT/ML IV SOLN
250.0000 [IU] | Freq: Once | INTRAVENOUS | Status: DC | PRN
  Filled 2016-07-02: qty 5

## 2016-07-02 MED ORDER — SODIUM CHLORIDE 0.9% FLUSH
10.0000 mL | INTRAVENOUS | Status: DC | PRN
  Administered 2016-07-02: 10 mL
  Filled 2016-07-02: qty 10

## 2016-07-02 MED ORDER — SODIUM CHLORIDE 0.9 % IV SOLN
Freq: Once | INTRAVENOUS | Status: AC
  Administered 2016-07-02: 10:00:00 via INTRAVENOUS
  Filled 2016-07-02: qty 1000

## 2016-07-02 MED ORDER — SODIUM CHLORIDE 0.9 % IV SOLN
510.0000 mg | Freq: Once | INTRAVENOUS | Status: AC
  Administered 2016-07-02: 510 mg via INTRAVENOUS
  Filled 2016-07-02: qty 17

## 2016-07-02 NOTE — Progress Notes (Signed)
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 Patient had a normal CBC up until 10/02/2016when her white count was 5.1, H&H was 11.7/34.5 and a platelet count of 155. Since then she has developed a gradually leukopenia and since October 2017 her white count has been between 2.7-3.9. Also she has had worsening anemia and her hemoglobin has been between 8.4-11. Her platelet counts have been more or less normal. Last CBC from 12/28/2015 showed a white count of 3.3, H&H of 8.4/25.7 and platelet count of 168. Differential on the CBC showed a normal neutrophil count with relative lymphocytopenia. BMP done one month ago showed hyponatremia with a sodium of 131, potassium of 2.9. LFTs were within normal limits. Ferritin in October 2017 was normal at 257. Folate was normal at 6.9. Haptoglobin was normal at 92. TSH in October 2017 was elevated at 37.43. T3 was low at 39. T4 was normal at 1.11. B12 was normal at 959. Patient has had multiple ER visits for abdominal pain nausea vomiting as well as constipation. She has been following up with GI at [place]. Last CT abdomen on 12/09/2015 showed mild diffuse circumferential colonic wall thickening involving the ascending and transverse colon. There was no evidence of any lymphadenopathy or malignancy. She has a history of hypothyroidism but had not been compliant with her medications in the past. Also has a history of rheumatoid arthritis. She was treated with methotrexate for the same but could not tolerate  it and has been off medications for a while. She reports no active rheumatoid arthritis symptoms and has not followed up with rheumatology in quite some time.  Currently patient reports a 40 pound weight loss over the last 1 year. She also has significant fatigue. She has had multiple ER admissions for abdominal pain. She continues to have problems with nausea and vomiting which waxes and wanes. Someday she is able to eat well but some days she says that her abdominal pain is so bad that she prefers not to eat for a few days. She has had EGD colonoscopy and multiple CT scans off her abdomen and no clear etiology of her abdominal pain has been ascertained.  Results of blood work from 02/09/2016 were as follows: CBC showed a normal white count of 7.8, H&H of 11.6/35.1 and platelet count of 387. Folate levels were slightly low at 4.8. B12 level was normal at 488. HIV testing was negative. Peripheral flow cytometry did not reveal any significant immunophenotyping abnormality. A lambda free light chain ratio was normal at 1. SPEP did not reveal any monoclonal protein however in no cessation showed a small IgA monoclonal protein with lambda light chain sensitivity. ANA testing was negative. TSH was elevated at 75.19. T3 levels were low at 1.3.  Interval history- continues to have on and off abdominal pain and has been seeing GI. Also sees endocrinology for her thyroid issues    Review of systems- Review of Systems  Constitutional: Positive for malaise/fatigue. Negative for chills, fever and weight loss.  HENT: Negative for congestion, ear discharge and nosebleeds.   Eyes: Negative for  blurred vision.  Respiratory: Negative for cough, hemoptysis, sputum production, shortness of breath and wheezing.   Cardiovascular: Negative for chest pain, palpitations, orthopnea and claudication.  Gastrointestinal: Positive for abdominal pain. Negative for blood in stool, constipation, diarrhea, heartburn, melena,  nausea and vomiting.  Genitourinary: Negative for dysuria, flank pain, frequency, hematuria and urgency.  Musculoskeletal: Positive for joint pain. Negative for back pain and myalgias.  Skin: Negative for rash.  Neurological: Negative for dizziness, tingling, focal weakness, seizures, weakness and headaches.  Endo/Heme/Allergies: Does not bruise/bleed easily.  Psychiatric/Behavioral: Negative for depression and suicidal ideas. The patient does not have insomnia.      Allergies  Allergen Reactions  . Methotrexate Derivatives Other (See Comments)    headaches  . Morphine And Related Hives and Itching  . Relafen [Nabumetone] Other (See Comments)    headaches     Past Medical History:  Diagnosis Date  . At high risk for falls   . Chronic abdominal pain 2008   dates back to at least 2008.  multiple ultrasound, CT scans, x rays at [facility], [place].  [facility] colonoscopy, egd unrevealing.    . Colitis 2015   ? colitis per CTs in 2015, 2016 and 11/2015.  presumed infectious.  colonoscopy in 2015 negative for colitis.   . Constipation   . Cyclic vomiting syndrome 2015   per opinion  of Dr Granville Lewis, IBS specialist at [facility].   . Degenerative disc disease   . Depression   . Fibromyalgia   . Gastroparesis 03/2011   markedly abnormal gastric emptying study.   . Hematemesis 11/2015   mild to moderate blood in emesis appeared after 2 plus weeks of frequent, non-bloody emesis.   [name] Hemorrhoids   . Hemorrhoids   . History of chicken pox   . Hypokalemia   . Hypothyroid   . Lupus   . 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 Rheumatoid arthritis [facility])      Past Surgical History:  Procedure Laterality Date  . ABDOMINAL HYSTERECTOMY     total  . CESAREAN SECTION     x2  . CHOLECYSTECTOMY    . COLONOSCOPY  11/2013   at [facility] Dr Kennith Gain. Hemorrhoids. sigmoid tics. random biopsies negative for microscopic colitis or any pathology.   . ESOPHAGOGASTRODUODENOSCOPY  11/2013   Dr Kennith Gain at [facility].  Normal study.   Duodenal bx negative for villous atrophy.   . ESOPHAGOGASTRODUODENOSCOPY N/A 12/09/2015   Procedure: ESOPHAGOGASTRODUODENOSCOPY (EGD);  Surgeon: Doran Stabler, MD;  Location[facility] ENDOSCOPY;  Service: Endoscopy;  Laterality: N/A;  . KNEE SURGERY Left   . OOPHORECTOMY    . REPLACEMENT TOTAL KNEE Bilateral     Social History   Social History  . Marital status: Married    Spouse name: N/A  . Number of children: N/A  . Years of education: N/A   Occupational History  . Not on file.   Social History Main Topics  . Smoking status: Never Smoker  . Smokeless tobacco: Never Used  . Alcohol use Yes     Comment: social  . Drug use: No  . Sexual activity: Yes   Other Topics Concern  . Not on file   Social History Narrative  . No narrative on file    Family History  Problem Relation Age of Onset  . Leukemia Mother   . Arthritis Mother   . Early death Mother   . Miscarriages / [place] Mother   . Hypothyroidism Sister   . Cancer  Paternal Grandmother   . Cancer Paternal Grandfather      Current Outpatient Prescriptions:  .  amitriptyline (ELAVIL) 75 MG tablet, Take 75 mg by mouth at bedtime., Disp: , Rfl:  .  Aspirin-Acetaminophen-Caffeine (EXCEDRIN PO), Take 1 tablet by mouth., Disp: , Rfl:  .  cyclobenzaprine (FLEXERIL) 5 MG tablet, Take 1 tablet (5 mg total) by mouth 3 (three) times daily as needed for muscle spasms., Disp: 15 tablet, Rfl: 0 .  dicyclomine (BENTYL) 20 MG tablet, Take 1 tablet (20 mg total) by mouth 2 (two) times daily., Disp: 20 tablet, Rfl: 0 .  gabapentin (NEURONTIN) 300 MG capsule, Take 2 capsules (600 mg total) by mouth 2 (two) times daily., Disp: 120 capsule, Rfl: 0 .  gabapentin (NEURONTIN) 600 MG tablet, Take 1 tablet (600 mg total) by mouth 3 (three) times daily. (Patient taking differently: Take 600 mg by mouth 2 (two) times daily. ), Disp: 90 tablet, Rfl: 0 .  levothyroxine (SYNTHROID, LEVOTHROID) 100 MCG tablet, Take 300 mcg by mouth daily  before breakfast. , Disp: , Rfl:  .  levothyroxine (SYNTHROID, LEVOTHROID) 25 MCG tablet, Take 25 mcg by mouth daily before breakfast. Take with 345mcg to = 336mcg total, Disp: , Rfl:  .  meclizine (ANTIVERT) 25 MG tablet, Take 1 tablet (25 mg total) by mouth 3 (three) times daily as needed. (Patient not taking: Reported on 02/09/2016), Disp: 21 tablet, Rfl: 0 .  ondansetron (ZOFRAN ODT) 4 MG disintegrating tablet, Take 1 tablet (4 mg total) by mouth every 8 (eight) hours as needed for nausea or vomiting., Disp: 20 tablet, Rfl: 0 .  oxyCODONE-acetaminophen (ROXICET) 5-325 MG tablet, Take 1 tablet by mouth every 8 (eight) hours as needed., Disp: 15 tablet, Rfl: 0 .  traMADol (ULTRAM) 50 MG tablet, Take 50 mg by mouth 2 (two) times daily., Disp: , Rfl:   Physical exam:  Vitals:   07/02/16 0903  BP: (!) 157/78  Pulse: (!) 56  Resp: 18  Temp: 98.3 F (36.8 C)  TempSrc: Tympanic  Weight: 149 lb 7.6 oz (67.8 kg)  Height: 5' 1.81" (1.57 m)   Physical Exam  Constitutional: She is oriented to person, place, and time and well-developed, well-nourished, and in no distress.  HENT:  Head: Normocephalic and atraumatic.  Eyes: EOM are normal. Pupils are equal, round, and reactive to light.  Neck: Normal range of motion.  Cardiovascular: Normal rate, regular rhythm and normal heart sounds.   Pulmonary/Chest: Effort normal and breath sounds normal.  Abdominal: Soft. Bowel sounds are normal.  TTP lower abdomen  Neurological: She is alert and oriented to person, place, and time.  Skin: Skin is warm and dry.  Scattered scabbed circular lesions over face and UE     CMP Latest Ref Rng & Units 05/29/2016  Glucose 65 - 99 mg/dL 88  BUN 6 - 20 mg/dL 11  Creatinine 0.44 - 1.00 mg/dL 0.85  Sodium 135 - 145 mmol/L 139  Potassium 3.5 - 5.1 mmol/L 3.6  Chloride 101 - 111 mmol/L 104  CO2 22 - 32 mmol/L 24  Calcium 8.9 - 10.3 mg/dL 9.3  Total Protein 6.5 - 8.1 g/dL 8.1  Total Bilirubin 0.3 - 1.2 mg/dL 0.5   Alkaline Phos 38 - 126 U/L 118  AST 15 - 41 U/L 20  ALT 14 - 54 U/L 8(L)   CBC Latest Ref Rng & Units 07/02/2016  WBC 3.6 - 11.0 K/uL 6.3  Hemoglobin 12.0 - 16.0 g/dL 9.7(L)  Hematocrit 35.0 -  47.0 % 30.8(L)  Platelets 150 - 440 K/uL 351    Recent CBC from 05/31/2016 showed H&H of 10.3/33.1 and a platelet count 538. White count was normal at 7.6. Ferritin was low at 22   Assessment and plan- Patient is a 51 y.o. female referred for leukopenia and anemia  1. Lymphopenia- likely due to hypothyroidism. Resolved  2. Normocytic anemia- likely multifactorial due to hypothyroidism, iron and folate deficiency. Recent iron studies do indicate iron deficiency. Will repeat iron levels today. If low, will plan to give her IV iron feraheme 510 mg X 2 doses once weekly. Repeat cbc and iron studies in 8 weeks and I will see her abck in 4 months with bloodwork  3. Chronic abdominal pain- f/u with GI  4. Hypothyroidism- sees endocrinology     Visit Diagnosis 1. Normocytic anemia   2. Iron deficiency anemia, unspecified iron deficiency anemia type      Dr. Randa Evens, MD, MPH [facility] at [facility] Pager- [phone] 07/02/2016 9:43 AM

## 2016-07-02 NOTE — Progress Notes (Signed)
Pt  Here for new pt eval

## 2016-07-02 NOTE — Patient Instructions (Signed)

## 2016-07-04 LAB — MULTIPLE MYELOMA PANEL, SERUM
ALBUMIN/GLOB SERPL: 0.7 (ref 0.7–1.7)
ALPHA 1: 0.4 g/dL (ref 0.0–0.4)
ALPHA2 GLOB SERPL ELPH-MCNC: 0.9 g/dL (ref 0.4–1.0)
Albumin SerPl Elph-Mcnc: 3 g/dL (ref 2.9–4.4)
B-Globulin SerPl Elph-Mcnc: 1.5 g/dL — ABNORMAL HIGH (ref 0.7–1.3)
GAMMA GLOB SERPL ELPH-MCNC: 1.5 g/dL (ref 0.4–1.8)
GLOBULIN, TOTAL: 4.3 g/dL — AB (ref 2.2–3.9)
IGA: 631 mg/dL — AB (ref 87–352)
IgG (Immunoglobin G), Serum: 1508 mg/dL (ref 700–1600)
IgM, Serum: 123 mg/dL (ref 26–217)
Total Protein ELP: 7.3 g/dL (ref 6.0–8.5)

## 2016-07-05 ENCOUNTER — Other Ambulatory Visit: Payer: Medicare Other

## 2016-07-05 ENCOUNTER — Ambulatory Visit: Payer: Medicare Other | Admitting: Oncology

## 2016-07-10 ENCOUNTER — Ambulatory Visit: Payer: Medicare Other

## 2016-07-11 ENCOUNTER — Inpatient Hospital Stay: Payer: Medicare Other

## 2016-07-11 VITALS — BP 97/67 | HR 73 | Temp 97.5°F | Resp 18

## 2016-07-11 DIAGNOSIS — D72810 Lymphocytopenia: Secondary | ICD-10-CM | POA: Diagnosis not present

## 2016-07-11 DIAGNOSIS — D509 Iron deficiency anemia, unspecified: Secondary | ICD-10-CM

## 2016-07-11 MED ORDER — SODIUM CHLORIDE 0.9 % IV SOLN
510.0000 mg | Freq: Once | INTRAVENOUS | Status: AC
  Administered 2016-07-11: 510 mg via INTRAVENOUS
  Filled 2016-07-11: qty 17

## 2016-07-11 MED ORDER — SODIUM CHLORIDE 0.9 % IV SOLN
Freq: Once | INTRAVENOUS | Status: AC
Start: 2016-07-11 — End: 2016-07-11
  Administered 2016-07-11: 14:00:00 via INTRAVENOUS
  Filled 2016-07-11: qty 1000

## 2016-07-11 MED ORDER — HEPARIN SOD (PORK) LOCK FLUSH 100 UNIT/ML IV SOLN
500.0000 [IU] | Freq: Once | INTRAVENOUS | Status: AC
  Administered 2016-07-11: 500 [IU] via INTRAVENOUS

## 2016-07-11 MED ORDER — SODIUM CHLORIDE 0.9% FLUSH
10.0000 mL | INTRAVENOUS | Status: DC | PRN
  Administered 2016-07-11: 10 mL via INTRAVENOUS
  Filled 2016-07-11: qty 10

## 2016-07-11 NOTE — Patient Instructions (Signed)

## 2016-09-03 ENCOUNTER — Other Ambulatory Visit: Payer: Medicare Other

## 2016-09-03 ENCOUNTER — Inpatient Hospital Stay: Payer: Medicare Other | Attending: Oncology

## 2016-11-05 ENCOUNTER — Ambulatory Visit: Payer: Medicare Other | Admitting: Oncology

## 2016-11-05 ENCOUNTER — Other Ambulatory Visit: Payer: Medicare Other

## 2016-11-05 ENCOUNTER — Inpatient Hospital Stay: Payer: Medicare Other | Attending: Oncology | Admitting: Oncology

## 2016-11-05 ENCOUNTER — Encounter: Payer: Self-pay | Admitting: Oncology

## 2016-11-05 ENCOUNTER — Inpatient Hospital Stay: Payer: Medicare Other

## 2016-11-05 VITALS — BP 130/83 | HR 73 | Temp 98.5°F | Resp 18 | Ht 61.81 in | Wt 170.0 lb

## 2016-11-05 DIAGNOSIS — R109 Unspecified abdominal pain: Secondary | ICD-10-CM | POA: Insufficient documentation

## 2016-11-05 DIAGNOSIS — M25561 Pain in right knee: Secondary | ICD-10-CM | POA: Insufficient documentation

## 2016-11-05 DIAGNOSIS — R5381 Other malaise: Secondary | ICD-10-CM | POA: Insufficient documentation

## 2016-11-05 DIAGNOSIS — E039 Hypothyroidism, unspecified: Secondary | ICD-10-CM | POA: Diagnosis not present

## 2016-11-05 DIAGNOSIS — D649 Anemia, unspecified: Secondary | ICD-10-CM

## 2016-11-05 DIAGNOSIS — R634 Abnormal weight loss: Secondary | ICD-10-CM | POA: Diagnosis not present

## 2016-11-05 DIAGNOSIS — Z806 Family history of leukemia: Secondary | ICD-10-CM | POA: Diagnosis not present

## 2016-11-05 DIAGNOSIS — Z452 Encounter for adjustment and management of vascular access device: Secondary | ICD-10-CM | POA: Insufficient documentation

## 2016-11-05 DIAGNOSIS — G8929 Other chronic pain: Secondary | ICD-10-CM

## 2016-11-05 DIAGNOSIS — Z809 Family history of malignant neoplasm, unspecified: Secondary | ICD-10-CM | POA: Diagnosis not present

## 2016-11-05 DIAGNOSIS — Z79899 Other long term (current) drug therapy: Secondary | ICD-10-CM | POA: Insufficient documentation

## 2016-11-05 DIAGNOSIS — M329 Systemic lupus erythematosus, unspecified: Secondary | ICD-10-CM | POA: Insufficient documentation

## 2016-11-05 DIAGNOSIS — D509 Iron deficiency anemia, unspecified: Secondary | ICD-10-CM | POA: Diagnosis not present

## 2016-11-05 DIAGNOSIS — Z7982 Long term (current) use of aspirin: Secondary | ICD-10-CM | POA: Insufficient documentation

## 2016-11-05 DIAGNOSIS — M069 Rheumatoid arthritis, unspecified: Secondary | ICD-10-CM | POA: Insufficient documentation

## 2016-11-05 DIAGNOSIS — Z95828 Presence of other vascular implants and grafts: Secondary | ICD-10-CM

## 2016-11-05 DIAGNOSIS — D638 Anemia in other chronic diseases classified elsewhere: Secondary | ICD-10-CM | POA: Diagnosis not present

## 2016-11-05 DIAGNOSIS — E871 Hypo-osmolality and hyponatremia: Secondary | ICD-10-CM | POA: Insufficient documentation

## 2016-11-05 DIAGNOSIS — R112 Nausea with vomiting, unspecified: Secondary | ICD-10-CM | POA: Insufficient documentation

## 2016-11-05 DIAGNOSIS — D72810 Lymphocytopenia: Secondary | ICD-10-CM

## 2016-11-05 DIAGNOSIS — R5383 Other fatigue: Secondary | ICD-10-CM | POA: Insufficient documentation

## 2016-11-05 LAB — FERRITIN: FERRITIN: 30 ng/mL (ref 11–307)

## 2016-11-05 LAB — CBC
HCT: 31.1 % — ABNORMAL LOW (ref 35.0–47.0)
HEMOGLOBIN: 10.6 g/dL — AB (ref 12.0–16.0)
MCH: 31.4 pg (ref 26.0–34.0)
MCHC: 34.2 g/dL (ref 32.0–36.0)
MCV: 91.9 fL (ref 80.0–100.0)
PLATELETS: 188 10*3/uL (ref 150–440)
RBC: 3.39 MIL/uL — AB (ref 3.80–5.20)
RDW: 16.5 % — ABNORMAL HIGH (ref 11.5–14.5)
WBC: 4.9 10*3/uL (ref 3.6–11.0)

## 2016-11-05 LAB — IRON AND TIBC
IRON: 32 ug/dL (ref 28–170)
Saturation Ratios: 11 % (ref 10.4–31.8)
TIBC: 303 ug/dL (ref 250–450)
UIBC: 271 ug/dL

## 2016-11-05 MED ORDER — SODIUM CHLORIDE 0.9% FLUSH
10.0000 mL | INTRAVENOUS | Status: DC | PRN
  Administered 2016-11-05: 10 mL via INTRAVENOUS
  Filled 2016-11-05: qty 10

## 2016-11-05 MED ORDER — HEPARIN SOD (PORK) LOCK FLUSH 100 UNIT/ML IV SOLN
500.0000 [IU] | Freq: Once | INTRAVENOUS | Status: AC
  Administered 2016-11-05: 500 [IU] via INTRAVENOUS

## 2016-11-05 NOTE — Progress Notes (Signed)
Not sleeping well at night, then cat naps during the day and that does not help with sleeping at night, feels more fatigued since last visit. No blood in stool that was obvious-she really does not pay attention. Feels llike hair is getting thinner but diet is not good.

## 2016-11-05 NOTE — Progress Notes (Signed)
Hematology/Oncology Consult note [facility]  Telephone:([phone] [phone]  Patient Care Team: [name], MD as PCP - General (Family Medicine)   Name of the patient: Adrienne Flores  [id number]  May 06, 1965   Date of visit: 11/05/16  Diagnosis- 1. 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 Iron deficiency anemia  Chief complaint/ Reason for visit- f/u of lymphopenia  Heme/Onc history: patient is a 50 year old female was then referred to [place] for evaluation and management of leukopenia. Patient had a normal CBC up until 10/02/2016when her white count was 5.1, H&H was 11.7/34.5 and a platelet count of 155. Since then she has developed a gradually leukopenia and since October 2017 her white count has been between 2.7-3.9. Also she has had worsening anemia and her hemoglobin has been between 8.4-11. Her platelet counts have been more or less normal. Last CBC from 12/28/2015 showed a white count of 3.3, H&H of 8.4/25.7 and platelet count of 168. Differential on the CBC showed a normal neutrophil count with relative lymphocytopenia. BMP done one month ago showed hyponatremia with a sodium of 131, potassium of 2.9. LFTs were within normal limits. Ferritin in October 2017 was normal at 257. Folate was normal at 6.9. Haptoglobin was normal at 92. TSH in October 2017 was elevated at 37.43. T3 was low at 39. T4 was normal at 1.11. B12 was normal at 959. Patient has had multiple ER visits for abdominal pain nausea vomiting as well as constipation. She has been following up with GI at [place]. Last egd/ colonoscopy in 2015. Also had repeat EGD in 2017 which was unremarkable in 2017. Last CT abdomen on 12/09/2015 showed mild diffuse circumferential colonic wall thickening involving the ascending and transverse colon. There was no evidence of any lymphadenopathy or malignancy. She has a history of hypothyroidism but had not been compliant with her  medications in the past. Also has a history of rheumatoid arthritis. She was treated with methotrexate for the same but could not tolerate it and has been off medications for a while. She reports no active rheumatoid arthritis symptoms and has not followed up with rheumatology in quite some time.  Currently patient reports a 40 pound weight loss over the last 1 year. She also has significant fatigue. She has had multiple ER admissions for abdominal pain. She continues to have problems with nausea and vomiting which waxes and wanes. Someday she is able to eat well but some days she says that her abdominal pain is so bad that she prefers not to eat for a few days. She has had EGD colonoscopy and multiple CT scans off her abdomen and no clear etiology of her abdominal pain has been ascertained.  Results of blood work from 02/09/2016 were as follows: CBC showed a normal white count of 7.8, H&H of 11.6/35.1 and platelet count of 387. Folate levels were slightly low at 4.8. B12 level was normal at 488. HIV testing was negative. Peripheral flow cytometry did not reveal any significant immunophenotyping abnormality. A lambda free light chain ratio was normal at 1. SPEP did not reveal any monoclonal protein. Immunofixation shows IgA monoclonal protein with lambda light chain specificity.  ANA testing was negative. TSH was elevated at 75.19. T3 levels were low at 1.3.  Iron studies suggestive of iron deficiency in May 2018 and patient received 2 doses of feraheme   Interval history- reports chronic abdominal pain. Denies any bleeding her stools. Also has ongoing right knee pain  ECOG PS-  1 Pain scale- 6   Review of systems- Review of Systems  Constitutional: Positive for malaise/fatigue. Negative for chills, fever and weight loss.  HENT: Negative for congestion, ear discharge and nosebleeds.   Eyes: Negative for blurred vision.  Respiratory: Negative for cough, hemoptysis, sputum production, shortness of  breath and wheezing.   Cardiovascular: Negative for chest pain, palpitations, orthopnea and claudication.  Gastrointestinal: Positive for abdominal pain. Negative for blood in stool, constipation, diarrhea, heartburn, melena, nausea and vomiting.  Genitourinary: Negative for dysuria, flank pain, frequency, hematuria and urgency.  Musculoskeletal: Positive for back pain and joint pain. Negative for myalgias.  Skin: Negative for rash.  Neurological: Negative for dizziness, tingling, focal weakness, seizures, weakness and headaches.  Endo/Heme/Allergies: Does not bruise/bleed easily.  Psychiatric/Behavioral: Negative for depression and suicidal ideas. The patient does not have insomnia.       Allergies  Allergen Reactions  . Methotrexate Derivatives Other (See Comments)    headaches  . Morphine And Related Hives and Itching  . Relafen [Nabumetone] Other (See Comments)    Headache headaches  . Morphine Hives     Past Medical History:  Diagnosis Date  . At high risk for falls   . Chronic abdominal pain 2008   dates back to at least 2008.  multiple ultrasound, CT scans, x rays at [facility], [place].  [facility] colonoscopy, egd unrevealing.    . Colitis 2015   ? colitis per CTs in 2015, 2016 and 11/2015.  presumed infectious.  colonoscopy in 2015 negative for colitis.   . Constipation   . Cyclic vomiting syndrome 2015   per opinion  of Dr Granville Lewis, IBS specialist at [facility].   . Degenerative disc disease   . Depression   . Fibromyalgia   . Gastroparesis 03/2011   markedly abnormal gastric emptying study.   . Hematemesis 11/2015   mild to moderate blood in emesis appeared after 2 plus weeks of frequent, non-bloody emesis.   [name] Hemorrhoids   . Hemorrhoids   . History of chicken pox   . Hypokalemia   . Hypothyroid   . Lupus   . 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 Rheumatoid arthritis [facility])      Past Surgical History:  Procedure Laterality Date  . ABDOMINAL HYSTERECTOMY     total  .  CESAREAN SECTION     x2  . CHOLECYSTECTOMY    . COLONOSCOPY  11/2013   at [facility] Dr Kennith Gain. Hemorrhoids. sigmoid tics. random biopsies negative for microscopic colitis or any pathology.   . ESOPHAGOGASTRODUODENOSCOPY  11/2013   Dr Kennith Gain at [facility].  Normal study.  Duodenal bx negative for villous atrophy.   . ESOPHAGOGASTRODUODENOSCOPY N/A 12/09/2015   Procedure: ESOPHAGOGASTRODUODENOSCOPY (EGD);  Surgeon: Doran Stabler, MD;  Location: [facility] ENDOSCOPY;  Service: Endoscopy;  Laterality: N/A;  . KNEE SURGERY Left   . OOPHORECTOMY    . REPLACEMENT TOTAL KNEE Bilateral     Social History   Social History  . Marital status: Married    Spouse name: N/A  . Number of children: N/A  . Years of education: N/A   Occupational History  . Not on file.   Social History Main Topics  . Smoking status: Never Smoker  . Smokeless tobacco: Never Used  . Alcohol use No  . Drug use: No  . Sexual activity: Yes   Other Topics Concern  . Not on file   Social History Narrative  . No narrative on file    Family  History  Problem Relation Age of Onset  . Leukemia Mother   . Arthritis Mother   . Early death Mother   . Miscarriages / [place] Mother   . Hypothyroidism Sister   . Cancer Paternal Grandmother   . Cancer Paternal Grandfather   . Hypothyroidism Brother      Current Outpatient Prescriptions:  .  amitriptyline (ELAVIL) 75 MG tablet, Take 75 mg by mouth at bedtime., Disp: , Rfl:  .  Aspirin-Acetaminophen-Caffeine (EXCEDRIN PO), Take 1 tablet by mouth., Disp: , Rfl:  .  gabapentin (NEURONTIN) 300 MG capsule, Take 2 capsules (600 mg total) by mouth 2 (two) times daily., Disp: 120 capsule, Rfl: 0 .  gabapentin (NEURONTIN) 600 MG tablet, Take 1 tablet (600 mg total) by mouth 3 (three) times daily. (Patient taking differently: Take 600 mg by mouth 2 (two) times daily. ), Disp: 90 tablet, Rfl: 0 .  levothyroxine (SYNTHROID, LEVOTHROID) 100 MCG tablet, Take 300 mcg by mouth daily before  breakfast. , Disp: , Rfl:  .  levothyroxine (SYNTHROID, LEVOTHROID) 25 MCG tablet, Take 25 mcg by mouth daily before breakfast. Take with 336mcg to = 346mcg total, Disp: , Rfl:  .  oxyCODONE-acetaminophen (ROXICET) 5-325 MG tablet, Take 1 tablet by mouth every 8 (eight) hours as needed., Disp: 15 tablet, Rfl: 0 .  potassium chloride SA (K-DUR,KLOR-CON) 20 MEQ tablet, Take by mouth., Disp: , Rfl:  .  traMADol (ULTRAM) 50 MG tablet, Take 50 mg by mouth 2 (two) times daily., Disp: , Rfl:   Physical exam:  Vitals:   11/05/16 0902  BP: 130/83  Pulse: 73  Resp: 18  Temp: 98.5 F (36.9 C)  TempSrc: Tympanic  Weight: 170 lb (77.1 kg)  Height: 5' 1.81" (1.57 m)   Physical Exam  Constitutional: She is oriented to person, place, and time.  Ill appearing but in no acute distress  HENT:  Head: Normocephalic and atraumatic.  Eyes: Pupils are equal, round, and reactive to light. EOM are normal.  Neck: Normal range of motion.  Cardiovascular: Normal rate, regular rhythm and normal heart sounds.   Pulmonary/Chest: Effort normal and breath sounds normal.  Abdominal: Soft. Bowel sounds are normal.  Mild diffuse TTP  Neurological: She is alert and oriented to person, place, and time.  Skin: Skin is warm and dry.     CMP Latest Ref Rng & Units 05/29/2016  Glucose 65 - 99 mg/dL 88  BUN 6 - 20 mg/dL 11  Creatinine 0.44 - 1.00 mg/dL 0.85  Sodium 135 - 145 mmol/L 139  Potassium 3.5 - 5.1 mmol/L 3.6  Chloride 101 - 111 mmol/L 104  CO2 22 - 32 mmol/L 24  Calcium 8.9 - 10.3 mg/dL 9.3  Total Protein 6.5 - 8.1 g/dL 8.1  Total Bilirubin 0.3 - 1.2 mg/dL 0.5  Alkaline Phos 38 - 126 U/L 118  AST 15 - 41 U/L 20  ALT 14 - 54 U/L 8(L)   CBC Latest Ref Rng & Units 11/05/2016  WBC 3.6 - 11.0 K/uL 4.9  Hemoglobin 12.0 - 16.0 g/dL 10.6(L)  Hematocrit 35.0 - 47.0 % 31.1(L)  Platelets 150 - 440 K/uL 188     Assessment and plan- Patient is a 51 y.o. female referred for leukopenia and anemia  1.  Lymphopenia- likely due to hypothyroidism. Resolved  2. Normocytic anemia- likely multifactorial due to iron deficiency, anemia of chronic disease. She is s/p 2 doses of feraheme. Hb improved to 10.6 from 9.7 which has been her baseline for last  3 years. She was supposed to get repeat colonoscopy at [facility] in Dec 2017 but was hospitalized and did not follow up subsequently. I will re refer her to them  3. Chronic abdominal pain- f/u with GI  4. Hypothyroidism- sees endocrinology  5. Problems with port access- port is able to flush but no blood return. This was inserted at [place] about 5 years ago for vascular access. Will send her to IR for the same   Visit Diagnosis 1. Iron deficiency anemia, unspecified iron deficiency anemia type   2. Normocytic anemia      Dr. Randa Evens, MD, MPH [facility] Pager- [phone] 11/05/2016 9:19 AM

## 2016-11-08 ENCOUNTER — Telehealth: Payer: Self-pay | Admitting: *Deleted

## 2016-11-08 NOTE — Telephone Encounter (Signed)
Called the office that pt had port placed by.  Left message on voicemail that pt had port placed 06/15/13 and she said for years it has not drawed back blood. Patient was difficult stick this week and husband wanted [place] to use port that we did not know about and it did not draw back blood and when we asked pt she said it has not had a blood draw back in years.  Dr. Janese Banks would like to see if there is anything that can be done to check status of her port issue. Await call back.

## 2016-11-09 ENCOUNTER — Telehealth (INDEPENDENT_AMBULATORY_CARE_PROVIDER_SITE_OTHER): Payer: Self-pay

## 2016-11-09 NOTE — Telephone Encounter (Signed)
Spoke with Judeen Hammans and gave her Dr. Bunnie Domino recommendation. See notes below.

## 2016-11-09 NOTE — Telephone Encounter (Signed)
Sherry from the Cancer center called stating that trying to use the  patient's port they can push fluids but are unable to draw blood from it and wants to know what can be done.

## 2016-11-12 ENCOUNTER — Telehealth: Payer: Self-pay | Admitting: *Deleted

## 2016-12-10 ENCOUNTER — Emergency Department
Admission: EM | Admit: 2016-12-10 | Discharge: 2016-12-10 | Disposition: A | Discharge status: Home / Self Care | Payer: Medicare Other | Attending: Emergency Medicine | Admitting: Emergency Medicine

## 2016-12-10 ENCOUNTER — Encounter: Payer: Self-pay | Admitting: Emergency Medicine

## 2016-12-10 ENCOUNTER — Emergency Department: Payer: Medicare Other

## 2016-12-10 DIAGNOSIS — Z79899 Other long term (current) drug therapy: Secondary | ICD-10-CM | POA: Insufficient documentation

## 2016-12-10 DIAGNOSIS — R110 Nausea: Secondary | ICD-10-CM | POA: Diagnosis not present

## 2016-12-10 DIAGNOSIS — E039 Hypothyroidism, unspecified: Secondary | ICD-10-CM | POA: Diagnosis not present

## 2016-12-10 DIAGNOSIS — R109 Unspecified abdominal pain: Secondary | ICD-10-CM

## 2016-12-10 DIAGNOSIS — N179 Acute kidney failure, unspecified: Secondary | ICD-10-CM

## 2016-12-10 DIAGNOSIS — R1011 Right upper quadrant pain: Secondary | ICD-10-CM | POA: Diagnosis present

## 2016-12-10 LAB — URINALYSIS, COMPLETE (UACMP) WITH MICROSCOPIC
Bacteria, UA: NONE SEEN
Bilirubin Urine: NEGATIVE
GLUCOSE, UA: NEGATIVE mg/dL
Hgb urine dipstick: NEGATIVE
Ketones, ur: NEGATIVE mg/dL
Leukocytes, UA: NEGATIVE
Nitrite: NEGATIVE
PH: 6 (ref 5.0–8.0)
PROTEIN: NEGATIVE mg/dL
Specific Gravity, Urine: 1.012 (ref 1.005–1.030)

## 2016-12-10 LAB — COMPREHENSIVE METABOLIC PANEL
ALBUMIN: 4.7 g/dL (ref 3.5–5.0)
ALK PHOS: 118 U/L (ref 38–126)
ALT: 49 U/L (ref 14–54)
AST: 81 U/L — ABNORMAL HIGH (ref 15–41)
Anion gap: 15 (ref 5–15)
BUN: 18 mg/dL (ref 6–20)
CALCIUM: 9.8 mg/dL (ref 8.9–10.3)
CO2: 21 mmol/L — AB (ref 22–32)
CREATININE: 1.43 mg/dL — AB (ref 0.44–1.00)
Chloride: 98 mmol/L — ABNORMAL LOW (ref 101–111)
GFR calc non Af Amer: 41 mL/min — ABNORMAL LOW (ref >60)
GFR, EST AFRICAN AMERICAN: 47 mL/min — AB (ref >60)
GLUCOSE: 73 mg/dL (ref 65–99)
Potassium: 3.7 mmol/L (ref 3.5–5.1)
SODIUM: 134 mmol/L — AB (ref 135–145)
Total Bilirubin: 0.9 mg/dL (ref 0.3–1.2)
Total Protein: 9.7 g/dL — ABNORMAL HIGH (ref 6.5–8.1)

## 2016-12-10 LAB — CBC
HCT: 43.8 % (ref 35.0–47.0)
Hemoglobin: 14.7 g/dL (ref 12.0–16.0)
MCH: 30.7 pg (ref 26.0–34.0)
MCHC: 33.6 g/dL (ref 32.0–36.0)
MCV: 91.5 fL (ref 80.0–100.0)
PLATELETS: 280 10*3/uL (ref 150–440)
RBC: 4.79 MIL/uL (ref 3.80–5.20)
RDW: 15.3 % — ABNORMAL HIGH (ref 11.5–14.5)
WBC: 5.5 10*3/uL (ref 3.6–11.0)

## 2016-12-10 LAB — LACTIC ACID, PLASMA: Lactic Acid, Venous: 1.3 mmol/L (ref 0.5–1.9)

## 2016-12-10 LAB — TROPONIN I: Troponin I: <0.03 ng/mL (ref <0.03)

## 2016-12-10 LAB — LIPASE, BLOOD: Lipase: 33 U/L (ref 11–51)

## 2016-12-10 MED ORDER — SODIUM CHLORIDE 0.9 % IV BOLUS (SEPSIS)
1000.0000 mL | Freq: Once | INTRAVENOUS | Status: AC
  Administered 2016-12-10: 1000 mL via INTRAVENOUS

## 2016-12-10 MED ORDER — TRAMADOL HCL 50 MG PO TABS: 50.0000 mg | ORAL_TABLET | Freq: Four times a day (QID) | ORAL | 0 refills | Status: DC | PRN

## 2016-12-10 MED ORDER — ONDANSETRON HCL 4 MG/2ML IJ SOLN
4.0000 mg | Freq: Once | INTRAMUSCULAR | Status: AC
  Administered 2016-12-10: 4 mg via INTRAVENOUS

## 2016-12-10 MED ORDER — HYDROMORPHONE HCL 1 MG/ML IJ SOLN
0.5000 mg | Freq: Once | INTRAMUSCULAR | Status: AC
  Administered 2016-12-10: 0.5 mg via INTRAVENOUS
  Filled 2016-12-10: qty 1

## 2016-12-10 MED ORDER — ONDANSETRON HCL 4 MG/2ML IJ SOLN
4.0000 mg | Freq: Once | INTRAMUSCULAR | Status: AC
  Administered 2016-12-10: 4 mg via INTRAVENOUS
  Filled 2016-12-10: qty 2

## 2016-12-10 MED ORDER — HYDROMORPHONE HCL 1 MG/ML IJ SOLN
INTRAMUSCULAR | Status: AC
  Administered 2016-12-10: 0.5 mg via INTRAVENOUS
  Filled 2016-12-10: qty 1

## 2016-12-10 MED ORDER — IOPAMIDOL (ISOVUE-300) INJECTION 61%
15.0000 mL | INTRAVENOUS | Status: AC
  Administered 2016-12-10 (×2): 15 mL via ORAL

## 2016-12-10 MED ORDER — ONDANSETRON HCL 4 MG/2ML IJ SOLN
INTRAMUSCULAR | Status: AC
  Administered 2016-12-10: 4 mg via INTRAVENOUS
  Filled 2016-12-10: qty 2

## 2016-12-10 MED ORDER — HYDROMORPHONE HCL 1 MG/ML IJ SOLN
0.5000 mg | Freq: Once | INTRAMUSCULAR | Status: AC
  Administered 2016-12-10: 0.5 mg via INTRAVENOUS

## 2016-12-10 MED ORDER — HEPARIN SOD (PORK) LOCK FLUSH 10 UNIT/ML IV SOLN
10.0000 [IU] | Freq: Once | INTRAVENOUS | Status: AC
  Administered 2016-12-10: 10 [IU]
  Filled 2016-12-10: qty 1

## 2016-12-10 NOTE — ED Notes (Signed)
Pt is difficult stick, one successful attempt per this RN.  Port accessed at this time, but without blood return.  Lab notified to collect blood specimens.

## 2016-12-10 NOTE — ED Triage Notes (Signed)
Pt c/o abd pain for three days and also wants to be seen for back pain.

## 2016-12-10 NOTE — Discharge Instructions (Signed)
You are evaluated from right-sided abdominal pain, healthiness and because of prior exam and there was evaluation are overall reassuring in the emergency department today.  You did show signs of mild dehydration with mild acute renal failure and was given IV fluids here in the emergency department.  Your CT scan was overall reassuring.  Return to the emergency department immediately for any new or worsening symptoms, uncontrolled pain, black or bloody stools, vomiting, fever, or any other symptoms concerning to you.

## 2016-12-10 NOTE — ED Notes (Signed)
Patient transported to CT 

## 2016-12-10 NOTE — ED Notes (Signed)
Pt finished drinking oral contrast and [name] in [place] notified.

## 2016-12-10 NOTE — ED Notes (Signed)
Lab notified to collect Lactic Acid.

## 2016-12-10 NOTE — ED Notes (Signed)
Lab to bedside at this time. 

## 2016-12-10 NOTE — ED Provider Notes (Addendum)
[facility] Emergency Department Provider Note ____________________________________________   I have reviewed the triage vital signs and the triage nursing note.  HISTORY  Chief Complaint Abdominal Pain   Historian Patient  HPI Adrienne Flores is a 51 y.o. female  presents with several days of intermittent waxing and waning, but mostly constant right-sided mid abdominal pain.  Decreased bowel movements although no suspected diarrhea or constipation.  No fevers.  Nausea without vomiting.  No chest pain.  Still has her appendix.  She has had a total hysterectomy.  She had a C-section.  She has had gallbladder removed.  She states that she has been seen previously for this type of pain with no identifiable cause.  She does have rheumatoid arthritis as well as peripheral neuropathy.  Is currently moderate to severe.  Eating sometimes makes it a little worse.     Past Medical History:  Diagnosis Date  . Anemia   . At high risk for falls   . Chronic abdominal pain 2008   dates back to at least 2008.  multiple ultrasound, CT scans, x rays at [facility], [place].  [facility] colonoscopy, egd unrevealing.    . Colitis 2015   ? colitis per CTs in 2015, 2016 and 11/2015.  presumed infectious.  colonoscopy in 2015 negative for colitis.   . Constipation   . Cyclic vomiting syndrome 2015   per opinion  of Dr Granville Lewis, IBS specialist at [facility].   . Degenerative disc disease   . Depression   . Fibromyalgia   . Gastroparesis 03/2011   markedly abnormal gastric emptying study.   . Hematemesis 11/2015   mild to moderate blood in emesis appeared after 2 plus weeks of frequent, non-bloody emesis.   [name] Hemorrhoids   . Hemorrhoids   . History of chicken pox   . Hypokalemia   . Hypothyroid   . Lupus   . 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 Rheumatoid arthritis [facility])     Patient Active Problem List   Diagnosis Date Noted  . Iron deficiency anemia 07/02/2016  . Protein-calorie  malnutrition, severe 12/12/2015  . Symptomatic anemia 12/09/2015  . Acute hyponatremia 12/09/2015  . Hematemesis 12/09/2015  . Chronic hoarseness 12/09/2015  . Unintended weight loss 12/09/2015  . Rheu arthrit of right ank/ft w involv of organs and systems ([place]) 06/16/2014  . DDD (degenerative disc disease), cervical 06/16/2014  . 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 DDD (degenerative disc disease), lumbosacral 06/16/2014  . Chronic abdominal and back  pain 07/26/2013  . Nausea and vomiting 07/26/2013  . 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 Rheumatoid arthritis ([place]) 07/26/2013  . UTI (lower urinary tract infection) 07/26/2013  . Uncontrolled pain 07/26/2013  . Acute on chronic renal failure ([place]) 06/27/2013  . Colitis, infectious 06/25/2013  . AKI (acute kidney injury) ([place]) 06/25/2013  . Transaminitis 06/25/2013  . Infectious colitis 06/25/2013  . Abdominal pain 11/26/2012  . Hypothyroidism 11/26/2012    Past Surgical History:  Procedure Laterality Date  . ABDOMINAL HYSTERECTOMY     total  . CESAREAN SECTION     x2  . CHOLECYSTECTOMY    . COLONOSCOPY  11/2013   at [facility] Dr Kennith Gain. Hemorrhoids. sigmoid tics. random biopsies negative for microscopic colitis or any pathology.   . ESOPHAGOGASTRODUODENOSCOPY  11/2013   Dr Kennith Gain at [facility].  Normal study.  Duodenal bx negative for villous atrophy.   . ESOPHAGOGASTRODUODENOSCOPY N/A 12/09/2015   Procedure: ESOPHAGOGASTRODUODENOSCOPY (EGD);  Surgeon: Estill Cotta  Dorothea Glassman, MD;  Location: [place];  Service: Endoscopy;  Laterality: N/A;  . KNEE SURGERY Left   . OOPHORECTOMY    . REPLACEMENT TOTAL KNEE Bilateral     Prior to Admission medications   Medication Sig Start Date End Date Taking? Authorizing Provider  Aspirin-Acetaminophen-Caffeine (EXCEDRIN PO) Take 1 tablet by mouth 2 (two) times daily as needed.    Yes [provider]  ciprofloxacin (CIPRO) 500 MG tablet Take 500 mg by mouth 2 (two) times daily.  11/29/16  Yes [provider]  levothyroxine (SYNTHROID, LEVOTHROID) 100 MCG tablet Take 300 mcg by mouth daily before breakfast.    Yes [provider]  levothyroxine (SYNTHROID, LEVOTHROID) 25 MCG tablet Take 25 mcg by mouth daily before breakfast. Take with 347mcg to = 331mcg total   Yes [provider]    Allergies  Allergen Reactions  . Methotrexate Derivatives Other (See Comments)    headaches  . Morphine And Related Hives and Itching  . Relafen [Nabumetone] Other (See Comments)    Headache headaches  . Morphine Hives    Family History  Problem Relation Age of Onset  . Leukemia Mother   . Arthritis Mother   . Early death Mother   . Miscarriages / [place] Mother   . Hypothyroidism Sister   . Cancer Paternal Grandmother   . Cancer Paternal Grandfather   . Hypothyroidism Brother     Social History Social History  Substance Use Topics  . Smoking status: Never Smoker  . Smokeless tobacco: Never Used  . Alcohol use No    Review of Systems  Constitutional: Negative for fever. Eyes: Negative for visual changes. ENT: Negative for sore throat. Cardiovascular: Negative for chest pain. Respiratory: Negative for shortness of breath. Gastrointestinal: Negative for vomiting and diarrhea. Genitourinary: Negative for dysuria. Musculoskeletal: Negative for back pain. Skin: Negative for rash. Neurological: Negative for headache.  ____________________________________________   PHYSICAL EXAM:  VITAL SIGNS: ED Triage Vitals  Enc Vitals Group     BP 12/10/16 0806 (!) 159/98     Pulse Rate 12/10/16 0806 94     Resp 12/10/16 0806 20     Temp 12/10/16 0806 97.6 F (36.4 C)     Temp Source 12/10/16 0806 Oral     SpO2 12/10/16 0806 95 %     Weight 12/10/16 0807 150 lb (68 kg)     Height 12/10/16 0807 5\' 1"  (1.549 m)     Head Circumference --      Peak Flow --      Pain Score 12/10/16 0810 10     Pain Loc --      Pain Edu? --      Excl.  in [place]? --     Constitutional: Alert and oriented.  No acute distress, but at times is tearful when she discusses her past history with the abdominal pain. HEENT   Head: Normocephalic and atraumatic.      Eyes: Conjunctivae are normal. Pupils equal and round.       Ears:         Nose: No congestion/rhinnorhea.   Mouth/Throat: Mucous membranes are moist.   Neck: No stridor. Cardiovascular/Chest: Normal rate, regular rhythm.  No murmurs, rubs, or gallops. Respiratory: Normal respiratory effort without tachypnea nor retractions. Breath sounds are clear and equal bilaterally. No wheezes/rales/rhonchi. Gastrointestinal: Soft. No distention, no guarding, no rebound.  Moderate tenderness and soft obese abdomen especially mid right sided.  No focal McBurney's point tenderness or lower abdominal  tenderness. Genitourinary/rectal:Deferred Musculoskeletal: Nontender with normal range of motion in all extremities. No joint effusions.  No lower extremity tenderness.  No edema. Neurologic:  Normal speech and language. No gross or focal neurologic deficits are appreciated. Skin:  Skin is warm, dry and intact. No rash noted. Psychiatric: Mood and affect are normal. Speech and behavior are normal. Patient exhibits appropriate insight and judgment.    ____________________________________________  LABS (pertinent positives/negatives) I, Lisa Roca, MD the attending physician have reviewed the labs noted below.  Labs Reviewed  COMPREHENSIVE METABOLIC PANEL - Abnormal; Notable for the following:       Result Value   Sodium 134 (*)    Chloride 98 (*)    CO2 21 (*)    Creatinine, Ser 1.43 (*)    Total Protein 9.7 (*)    AST 81 (*)    GFR calc non Af Amer 41 (*)    GFR calc Af Amer 47 (*)    All other components within normal limits  CBC - Abnormal; Notable for the following:    RDW 15.3 (*)    All other components within normal limits  URINALYSIS, COMPLETE (UACMP) WITH MICROSCOPIC -  Abnormal; Notable for the following:    Color, Urine YELLOW (*)    APPearance CLEAR (*)    Squamous Epithelial / LPF 0-5 (*)    All other components within normal limits  LIPASE, BLOOD  TROPONIN I  LACTIC ACID, PLASMA    ____________________________________________    EKG I, Lisa Roca, MD, the attending physician have personally viewed and interpreted all ECGs.  62 bpm.  Undetermined rhythm although it appears it is likely sinus.  Low voltage EKG.  Left axis deviation.  Nonspecific ST and T wave ____________________________________________  RADIOLOGY All Xrays were viewed by me.  Imaging interpreted by Radiologist, and I, Lisa Roca, MD the attending physician have reviewed the radiologist interpretation noted below.  CT abdomen pelvis without contrast with oral contrast: IMPRESSION: No acute abnormality abdomen or pelvis. No finding to explain the patient's symptoms.  Aortoiliac atherosclerosis.  Small fat containing umbilical hernia.  Status post hysterectomy and cholecystectomy. __________________________________________  PROCEDURES  Procedure(s) performed: None  Critical Care performed: None  ____________________________________________  No current facility-administered medications on file prior to encounter.    Current Outpatient Prescriptions on File Prior to Encounter  Medication Sig Dispense Refill  . Aspirin-Acetaminophen-Caffeine (EXCEDRIN PO) Take 1 tablet by mouth 2 (two) times daily as needed.     [name] levothyroxine (SYNTHROID, LEVOTHROID) 100 MCG tablet Take 300 mcg by mouth daily before breakfast.     . levothyroxine (SYNTHROID, LEVOTHROID) 25 MCG tablet Take 25 mcg by mouth daily before breakfast. Take with 362mcg to = 318mcg total      ____________________________________________  ED COURSE / ASSESSMENT AND PLAN  Pertinent labs & imaging results that were available during my care of the patient were reviewed by me and considered in my medical  decision making (see chart for details).   Uncertain etiology by history, patient seems to be excessively tearful, suspect some element of fear and frustration given recurrent abdominal pains.  I discussed with her I am not sure that we will be able to find the source of this similar pain to prior episodes, however will investigate emergency causes.  Patient has mild bump in BUN and creatinine consistent with possible mild dehydration, mild acute renal failure.  Patient given IV fluid bolus.  She states that her pain was much better, however she was still  pretty concerned given this is the first time in a year that she had pain like this.  We discussed risk and benefit of obtaining CT scan and chose to proceed.  CT scan reassuring.  I discussed with patient.  Okay for discharge home.  I have asked her to follow-up with primary care doctor.  Again she was treated for some mild dehydration I have asked her to make sure that she is drinking plenty fluids.    DIFFERENTIAL DIAGNOSIS: Differential diagnosis includes, but is not limited to, biliary disease (biliary colic, acute cholecystitis, cholangitis, choledocholithiasis, etc), intrathoracic causes for epigastric abdominal pain including ACS, gastritis, duodenitis, pancreatitis, small bowel or large bowel obstruction, abdominal aortic aneurysm, hernia, and gastritis.     CONSULTATIONS: None  Patient / Family / Caregiver informed of clinical course, medical decision-making process, and agree with plan.   I discussed return precautions, follow-up instructions, and discharge instructions with patient and/or family.  Discharge Instructions : You are evaluated from right-sided abdominal pain, healthiness and because of prior exam and there was evaluation are overall reassuring in the emergency department today.  You did show signs of mild dehydration with mild acute renal failure and was given IV fluids here in the emergency department.  Your CT  scan was overall reassuring.  Return to the emergency department immediately for any new or worsening symptoms, uncontrolled pain, black or bloody stools, vomiting, fever, or any other symptoms concerning to you.    I did to include patient did request short course of tramadol, I will refill 10 tablets.  Patient reports other allergies, but states that she tolerates tramadol. ___________________________________________   FINAL CLINICAL IMPRESSION(S) / ED DIAGNOSES   Final diagnoses:  Acute renal failure, unspecified acute renal failure type ([place])  Abdominal pain, right lateral              Note: This dictation was prepared with Dragon dictation. Any transcriptional errors that result from this process are unintentional    Lisa Roca, MD 12/10/16 Welcome    Lisa Roca, MD 12/10/16 [phone]

## 2016-12-11 ENCOUNTER — Encounter: Payer: Self-pay | Admitting: *Deleted

## 2016-12-11 ENCOUNTER — Emergency Department
Admission: EM | Admit: 2016-12-11 | Discharge: 2016-12-11 | Disposition: A | Discharge status: Home / Self Care | Payer: Medicare Other | Attending: Emergency Medicine | Admitting: Emergency Medicine

## 2016-12-11 DIAGNOSIS — E039 Hypothyroidism, unspecified: Secondary | ICD-10-CM | POA: Diagnosis not present

## 2016-12-11 DIAGNOSIS — Z96653 Presence of artificial knee joint, bilateral: Secondary | ICD-10-CM | POA: Diagnosis not present

## 2016-12-11 DIAGNOSIS — Z79899 Other long term (current) drug therapy: Secondary | ICD-10-CM | POA: Diagnosis not present

## 2016-12-11 DIAGNOSIS — R197 Diarrhea, unspecified: Secondary | ICD-10-CM | POA: Insufficient documentation

## 2016-12-11 LAB — CBC WITH DIFFERENTIAL/PLATELET
BASOS PCT: 1 %
Basophils Absolute: 0 10*3/uL (ref 0–0.1)
EOS ABS: 0 10*3/uL (ref 0–0.7)
Eosinophils Relative: 1 %
HCT: 32.3 % — ABNORMAL LOW (ref 35.0–47.0)
HEMOGLOBIN: 11.1 g/dL — AB (ref 12.0–16.0)
Lymphocytes Relative: 19 %
Lymphs Abs: 0.8 10*3/uL — ABNORMAL LOW (ref 1.0–3.6)
MCH: 31.1 pg (ref 26.0–34.0)
MCHC: 34.3 g/dL (ref 32.0–36.0)
MCV: 90.7 fL (ref 80.0–100.0)
Monocytes Absolute: 0.2 10*3/uL (ref 0.2–0.9)
Monocytes Relative: 6 %
NEUTROS PCT: 73 %
Neutro Abs: 3.1 10*3/uL (ref 1.4–6.5)
Platelets: 192 10*3/uL (ref 150–440)
RBC: 3.57 MIL/uL — AB (ref 3.80–5.20)
RDW: 15 % — ABNORMAL HIGH (ref 11.5–14.5)
WBC: 4.2 10*3/uL (ref 3.6–11.0)

## 2016-12-11 LAB — COMPREHENSIVE METABOLIC PANEL
ALBUMIN: 3.8 g/dL (ref 3.5–5.0)
ALK PHOS: 101 U/L (ref 38–126)
ALT: 45 U/L (ref 14–54)
AST: 67 U/L — AB (ref 15–41)
Anion gap: 8 (ref 5–15)
BUN: 15 mg/dL (ref 6–20)
CALCIUM: 9 mg/dL (ref 8.9–10.3)
CO2: 24 mmol/L (ref 22–32)
CREATININE: 1.36 mg/dL — AB (ref 0.44–1.00)
Chloride: 104 mmol/L (ref 101–111)
GFR calc Af Amer: 50 mL/min — ABNORMAL LOW (ref >60)
GFR calc non Af Amer: 44 mL/min — ABNORMAL LOW (ref >60)
GLUCOSE: 87 mg/dL (ref 65–99)
Potassium: 3.3 mmol/L — ABNORMAL LOW (ref 3.5–5.1)
SODIUM: 136 mmol/L (ref 135–145)
Total Bilirubin: 0.7 mg/dL (ref 0.3–1.2)
Total Protein: 7.8 g/dL (ref 6.5–8.1)

## 2016-12-11 LAB — URINALYSIS, COMPLETE (UACMP) WITH MICROSCOPIC
BILIRUBIN URINE: NEGATIVE
GLUCOSE, UA: NEGATIVE mg/dL
HGB URINE DIPSTICK: NEGATIVE
Ketones, ur: NEGATIVE mg/dL
LEUKOCYTES UA: NEGATIVE
NITRITE: NEGATIVE
Protein, ur: NEGATIVE mg/dL
RBC / HPF: NONE SEEN RBC/hpf (ref 0–5)
SPECIFIC GRAVITY, URINE: 1.011 (ref 1.005–1.030)
pH: 6 (ref 5.0–8.0)

## 2016-12-11 LAB — LIPASE, BLOOD: Lipase: 35 U/L (ref 11–51)

## 2016-12-11 MED ORDER — PROMETHAZINE HCL 25 MG PO TABS: 25.0000 mg | ORAL_TABLET | Freq: Four times a day (QID) | ORAL | 0 refills | Status: DC | PRN

## 2016-12-11 MED ORDER — SODIUM CHLORIDE 0.9 % IV SOLN: Freq: Once | INTRAVENOUS | Status: DC

## 2016-12-11 MED ORDER — HYDROMORPHONE HCL 1 MG/ML IJ SOLN
0.5000 mg | Freq: Once | INTRAMUSCULAR | Status: AC
  Administered 2016-12-11: 0.5 mg via INTRAVENOUS
  Filled 2016-12-11: qty 1

## 2016-12-11 MED ORDER — PROMETHAZINE HCL 25 MG/ML IJ SOLN
25.0000 mg | Freq: Once | INTRAMUSCULAR | Status: AC
Start: 2016-12-11 — End: 2016-12-11
  Administered 2016-12-11: 25 mg via INTRAVENOUS
  Filled 2016-12-11: qty 1

## 2016-12-11 MED ORDER — DIPHENOXYLATE-ATROPINE 2.5-0.025 MG PO TABS: 1.0000 | ORAL_TABLET | Freq: Four times a day (QID) | ORAL | 1 refills | Status: DC | PRN

## 2016-12-11 NOTE — ED Triage Notes (Signed)
States she was seen in ED yesterday for abd pain and diarrhea, states not feeling any better, awake and alert in no acute distress

## 2016-12-11 NOTE — ED Notes (Signed)
Unsuccessful with lab draw x 4. Lab called to come draw.

## 2016-12-11 NOTE — ED Provider Notes (Signed)
[facility] Emergency Department Provider Note       Time seen: ----------------------------------------- 9:07 AM on 12/11/2016 -----------------------------------------     I have reviewed the triage vital signs and the nursing notes.   HISTORY   Chief Complaint Diarrhea    HPI Adrienne Flores is a 51 y.o. female with a history of chronic back and abdominal pain who presents to the ED for abdominal pain and diarrhea. Patient states yesterday when she was here for abdominal pain she was not really having diarrhea. Subsequently she said at least 4 diarrheal episodes at home. She denies fevers, chills or other complaints. She does have chronic abdominal painalmost every day but states is not normally this bad.  Past Medical History:  Diagnosis Date  . Anemia   . At high risk for falls   . Chronic abdominal pain 2008   dates back to at least 2008.  multiple ultrasound, CT scans, x rays at [facility], [place].  [facility] colonoscopy, egd unrevealing.    . Colitis 2015   ? colitis per CTs in 2015, 2016 and 11/2015.  presumed infectious.  colonoscopy in 2015 negative for colitis.   . Constipation   . Cyclic vomiting syndrome 2015   per opinion  of Dr Granville Lewis, IBS specialist at [facility].   . Degenerative disc disease   . Depression   . Fibromyalgia   . Gastroparesis 03/2011   markedly abnormal gastric emptying study.   . Hematemesis 11/2015   mild to moderate blood in emesis appeared after 2 plus weeks of frequent, non-bloody emesis.   [name] Hemorrhoids   . Hemorrhoids   . History of chicken pox   . Hypokalemia   . Hypothyroid   . Lupus   . 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 Rheumatoid arthritis [facility])     Patient Active Problem List   Diagnosis Date Noted  . Iron deficiency anemia 07/02/2016  . Protein-calorie malnutrition, severe 12/12/2015  . Symptomatic anemia 12/09/2015  . Acute hyponatremia 12/09/2015  . Hematemesis 12/09/2015  . Chronic hoarseness  12/09/2015  . Unintended weight loss 12/09/2015  . Rheu arthrit of right ank/ft w involv of organs and systems ([place]) 06/16/2014  . DDD (degenerative disc disease), cervical 06/16/2014  . 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 DDD (degenerative disc disease), lumbosacral 06/16/2014  . Chronic abdominal and back  pain 07/26/2013  . Nausea and vomiting 07/26/2013  . 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 Rheumatoid arthritis ([place]) 07/26/2013  . UTI (lower urinary tract infection) 07/26/2013  . Uncontrolled pain 07/26/2013  . Acute on chronic renal failure ([place]) 06/27/2013  . Colitis, infectious 06/25/2013  . AKI (acute kidney injury) ([place]) 06/25/2013  . Transaminitis 06/25/2013  . Infectious colitis 06/25/2013  . Abdominal pain 11/26/2012  . Hypothyroidism 11/26/2012    Past Surgical History:  Procedure Laterality Date  . ABDOMINAL HYSTERECTOMY     total  . CESAREAN SECTION     x2  . CHOLECYSTECTOMY    . COLONOSCOPY  11/2013   at [facility] Dr Kennith Gain. Hemorrhoids. sigmoid tics. random biopsies negative for microscopic colitis or any pathology.   . ESOPHAGOGASTRODUODENOSCOPY  11/2013   Dr Kennith Gain at [facility].  Normal study.  Duodenal bx negative for villous atrophy.   . ESOPHAGOGASTRODUODENOSCOPY N/A 12/09/2015   Procedure: ESOPHAGOGASTRODUODENOSCOPY (EGD);  Surgeon: Doran Stabler, MD;  Location: [facility] ENDOSCOPY;  Service: Endoscopy;  Laterality: N/A;  . KNEE SURGERY Left   . OOPHORECTOMY    . REPLACEMENT  TOTAL KNEE Bilateral     Allergies Methotrexate derivatives; Morphine and related; Relafen [nabumetone]; and Morphine  Social History Social History  Substance Use Topics  . Smoking status: Never Smoker  . Smokeless tobacco: Never Used  . Alcohol use No    Review of Systems Constitutional: Negative for fever. Eyes: Negative for vision changes ENT:  Negative for congestion, sore throat Cardiovascular: Negative for chest pain. Respiratory: Negative for shortness of  breath. Gastrointestinal: positive for abdominal pain, diarrhea Genitourinary: Negative for dysuria. Musculoskeletal: Negative for back pain. Skin: Negative for rash. Neurological: Negative for headaches, focal weakness or numbness.  All systems negative/normal/unremarkable except as stated in the HPI  ____________________________________________   PHYSICAL EXAM:  VITAL SIGNS: ED Triage Vitals  Enc Vitals Group     BP 12/11/16 0900 116/87     Pulse Rate 12/11/16 0900 78     Resp 12/11/16 0900 16     Temp 12/11/16 0900 98.6 F (37 C)     Temp Source 12/11/16 0900 Oral     SpO2 12/11/16 0900 100 %     Weight 12/11/16 0901 150 lb (68 kg)     Height 12/11/16 0901 5\' 1"  (1.549 m)     Head Circumference --      Peak Flow --      Pain Score 12/11/16 0902 10     Pain Loc --      Pain Edu? --      Excl. in [place]? --     Constitutional: Alert and oriented. anxious, no distress Eyes: Conjunctivae are normal. Normal extraocular movements. ENT   Head: Normocephalic and atraumatic.   Nose: No congestion/rhinnorhea.   Mouth/Throat: Mucous membranes are moist.   Neck: No stridor. Cardiovascular: Normal rate, regular rhythm. No murmurs, rubs, or gallops. Respiratory: Normal respiratory effort without tachypnea nor retractions. Breath sounds are clear and equal bilaterally. No wheezes/rales/rhonchi. Gastrointestinal: Soft and nontender. Normal bowel sounds Musculoskeletal: Nontender with normal range of motion in extremities. No lower extremity tenderness nor edema. Neurologic:  Normal speech and language. No gross focal neurologic deficits are appreciated.  Skin:  Skin is warm, dry and intact. No rash noted. Psychiatric: depressed mood and affectal.  ____________________________________________  ED COURSE:  Pertinent labs & imaging results that were available during my care of the patient were reviewed by me and considered in my medical decision making (see chart for  details). Patient presents for abdominal pain and diarrhea, we will assess with labs and imaging as indicated.   Procedures ____________________________________________   LABS (pertinent positives/negatives)  Labs Reviewed  CBC WITH DIFFERENTIAL/PLATELET - Abnormal; Notable for the following:       Result Value   RBC 3.57 (*)    Hemoglobin 11.1 (*)    HCT 32.3 (*)    RDW 15.0 (*)    Lymphs Abs 0.8 (*)    All other components within normal limits  COMPREHENSIVE METABOLIC PANEL - Abnormal; Notable for the following:    Potassium 3.3 (*)    Creatinine, Ser 1.36 (*)    AST 67 (*)    GFR calc non Af Amer 44 (*)    GFR calc Af Amer 50 (*)    All other components within normal limits  URINALYSIS, COMPLETE (UACMP) WITH MICROSCOPIC - Abnormal; Notable for the following:    Color, Urine YELLOW (*)    APPearance CLEAR (*)    Bacteria, UA RARE (*)    Squamous Epithelial / LPF 0-5 (*)    All other  components within normal limits  C DIFFICILE QUICK SCREEN W PCR REFLEX  GASTROINTESTINAL PANEL BY PCR, STOOL (REPLACES STOOL CULTURE)  LIPASE, BLOOD   RADIOLOGY CT scan yesterday was negative ____________________________________________  DIFFERENTIAL DIAGNOSIS   gastroenteritis, chronic abdominal pain, IBS, C. difficile  FINAL ASSESSMENT AND PLAN  abdominal pain, diarrhea   Plan: Patient had presented for diarrhea and abdominal pain that persisted and worsened from yesterday. Patients labs her reassuring. Patients imaging that was performed yesterday was negative. She was unable to provide a stool specimen here. She'll be discharged with symptomatic treatment and outpatient follow-up.   Earleen Newport, MD   Note: This note was generated in part or whole with voice recognition software. Voice recognition is usually quite accurate but there are transcription errors that can and very often do occur. I apologize for any typographical errors that were not detected and corrected.      Earleen Newport, MD 12/11/16 [phone]

## 2016-12-17 ENCOUNTER — Telehealth: Payer: Self-pay | Admitting: *Deleted

## 2016-12-17 NOTE — Telephone Encounter (Signed)
appt with GI at [facility] on 11/7 at 9 am

## 2016-12-17 NOTE — Telephone Encounter (Signed)
I called and got husband's voicemail that it was a reminder about her upcoming appt with [facility] on 11/7 arrive 9 am appt. I had previously called the patient in October and left message about it.

## 2017-01-24 ENCOUNTER — Encounter: Payer: Self-pay | Admitting: Gastroenterology

## 2017-02-07 ENCOUNTER — Other Ambulatory Visit: Payer: Self-pay

## 2017-02-07 ENCOUNTER — Emergency Department (HOSPITAL_COMMUNITY): Payer: Medicare Other

## 2017-02-07 ENCOUNTER — Encounter (HOSPITAL_COMMUNITY): Payer: Self-pay | Admitting: *Deleted

## 2017-02-07 ENCOUNTER — Emergency Department (HOSPITAL_COMMUNITY)
Admission: EM | Admit: 2017-02-07 | Discharge: 2017-02-07 | Disposition: A | Discharge status: Home / Self Care | Payer: Medicare Other | Attending: Emergency Medicine | Admitting: Emergency Medicine

## 2017-02-07 DIAGNOSIS — E039 Hypothyroidism, unspecified: Secondary | ICD-10-CM | POA: Insufficient documentation

## 2017-02-07 DIAGNOSIS — R112 Nausea with vomiting, unspecified: Secondary | ICD-10-CM | POA: Insufficient documentation

## 2017-02-07 DIAGNOSIS — D509 Iron deficiency anemia, unspecified: Secondary | ICD-10-CM | POA: Diagnosis not present

## 2017-02-07 DIAGNOSIS — G8929 Other chronic pain: Secondary | ICD-10-CM | POA: Diagnosis not present

## 2017-02-07 DIAGNOSIS — Z79899 Other long term (current) drug therapy: Secondary | ICD-10-CM | POA: Diagnosis not present

## 2017-02-07 DIAGNOSIS — Z7982 Long term (current) use of aspirin: Secondary | ICD-10-CM | POA: Diagnosis not present

## 2017-02-07 DIAGNOSIS — R1084 Generalized abdominal pain: Secondary | ICD-10-CM | POA: Diagnosis present

## 2017-02-07 DIAGNOSIS — R52 Pain, unspecified: Secondary | ICD-10-CM

## 2017-02-07 LAB — URINALYSIS, ROUTINE W REFLEX MICROSCOPIC
Bilirubin Urine: NEGATIVE
GLUCOSE, UA: NEGATIVE mg/dL
Hgb urine dipstick: NEGATIVE
KETONES UR: NEGATIVE mg/dL
Nitrite: NEGATIVE
PROTEIN: NEGATIVE mg/dL
Specific Gravity, Urine: 1.008 (ref 1.005–1.030)
pH: 7 (ref 5.0–8.0)

## 2017-02-07 LAB — COMPREHENSIVE METABOLIC PANEL
ALT: 41 U/L (ref 14–54)
AST: 46 U/L — ABNORMAL HIGH (ref 15–41)
Albumin: 4.2 g/dL (ref 3.5–5.0)
Alkaline Phosphatase: 109 U/L (ref 38–126)
Anion gap: 13 (ref 5–15)
BUN: 11 mg/dL (ref 6–20)
CHLORIDE: 104 mmol/L (ref 101–111)
CO2: 20 mmol/L — ABNORMAL LOW (ref 22–32)
Calcium: 9.1 mg/dL (ref 8.9–10.3)
Creatinine, Ser: 1.2 mg/dL — ABNORMAL HIGH (ref 0.44–1.00)
GFR, EST AFRICAN AMERICAN: 59 mL/min — AB (ref >60)
GFR, EST NON AFRICAN AMERICAN: 51 mL/min — AB (ref >60)
Glucose, Bld: 69 mg/dL (ref 65–99)
Potassium: 3.8 mmol/L (ref 3.5–5.1)
Sodium: 137 mmol/L (ref 135–145)
Total Bilirubin: 0.6 mg/dL (ref 0.3–1.2)
Total Protein: 8.7 g/dL — ABNORMAL HIGH (ref 6.5–8.1)

## 2017-02-07 LAB — CBC
HEMATOCRIT: 36.5 % (ref 36.0–46.0)
HEMOGLOBIN: 11.9 g/dL — AB (ref 12.0–15.0)
MCH: 30.1 pg (ref 26.0–34.0)
MCHC: 32.6 g/dL (ref 30.0–36.0)
MCV: 92.4 fL (ref 78.0–100.0)
Platelets: 212 10*3/uL (ref 150–400)
RBC: 3.95 MIL/uL (ref 3.87–5.11)
RDW: 15 % (ref 11.5–15.5)
WBC: 5.6 10*3/uL (ref 4.0–10.5)

## 2017-02-07 LAB — LIPASE, BLOOD: LIPASE: 35 U/L (ref 11–51)

## 2017-02-07 MED ORDER — FENTANYL CITRATE (PF) 100 MCG/2ML IJ SOLN
50.0000 ug | Freq: Once | INTRAMUSCULAR | Status: AC
  Administered 2017-02-07: 50 ug via INTRAVENOUS
  Filled 2017-02-07: qty 2

## 2017-02-07 MED ORDER — IOPAMIDOL (ISOVUE-300) INJECTION 61%
INTRAVENOUS | Status: AC
  Administered 2017-02-07: 100 mL
  Filled 2017-02-07: qty 100

## 2017-02-07 MED ORDER — SODIUM CHLORIDE 0.9 % IV SOLN: INTRAVENOUS | Status: DC

## 2017-02-07 MED ORDER — SODIUM CHLORIDE 0.9 % IV BOLUS (SEPSIS)
1000.0000 mL | Freq: Once | INTRAVENOUS | Status: AC
  Administered 2017-02-07: 1000 mL via INTRAVENOUS

## 2017-02-07 MED ORDER — ONDANSETRON 4 MG PO TBDP: 4.0000 mg | ORAL_TABLET | Freq: Three times a day (TID) | ORAL | 0 refills | Status: DC | PRN

## 2017-02-07 MED ORDER — HYDROMORPHONE HCL 1 MG/ML IJ SOLN
1.0000 mg | Freq: Once | INTRAMUSCULAR | Status: AC
  Administered 2017-02-07: 1 mg via INTRAVENOUS
  Filled 2017-02-07: qty 1

## 2017-02-07 MED ORDER — ONDANSETRON HCL 4 MG/2ML IJ SOLN
4.0000 mg | Freq: Once | INTRAMUSCULAR | Status: AC
  Administered 2017-02-07: 4 mg via INTRAVENOUS
  Filled 2017-02-07: qty 2

## 2017-02-07 MED ORDER — HYDROCODONE-ACETAMINOPHEN 5-325 MG PO TABS: 1.0000 | ORAL_TABLET | ORAL | 0 refills | Status: DC | PRN

## 2017-02-07 NOTE — ED Notes (Signed)
Iv tam at bedside

## 2017-02-07 NOTE — ED Notes (Signed)
Pt ambulatory to the restroom.  

## 2017-02-07 NOTE — ED Triage Notes (Signed)
Pt reports abdominal pain for 2 day. Pt reports N/V. Pt reports ongoing abdominal pain for years with no known cause.

## 2017-02-07 NOTE — ED Notes (Signed)
Portable xray at bedside.

## 2017-02-07 NOTE — ED Provider Notes (Signed)
[facility] EMERGENCY DEPARTMENT Provider Note   CSN: [id number] Arrival date & time: 02/07/17  1218     History   Chief Complaint Chief Complaint  Patient presents with  . Abdominal Pain    HPI Adrienne Flores is a 51 y.o. female.  Pt presents to the ED today with abdominal pain.  Pt said she has had abdominal pain for years and no one can figure out what she has.  She thinks she has the same rare disease that her mother had which took 3 years to diagnose.  Unfortunately, the pt does not know the name of the disease.  The pt has been to various EDs multiple times for the same complaint.  She was at [facility] yesterday and on the 18th.  She said she is scheduled to see another GI doctor in the next few weeks although she does not know with what group.      Past Medical History:  Diagnosis Date  . Anemia   . At high risk for falls   . Chronic abdominal pain 2008   dates back to at least 2008.  multiple ultrasound, CT scans, x rays at [facility], [place].  [facility] colonoscopy, egd unrevealing.    . Colitis 2015   ? colitis per CTs in 2015, 2016 and 11/2015.  presumed infectious.  colonoscopy in 2015 negative for colitis.   . Constipation   . Cyclic vomiting syndrome 2015   per opinion  of Dr Granville Lewis, IBS specialist at [facility].   . Degenerative disc disease   . Depression   . Fibromyalgia   . Gastroparesis 03/2011   markedly abnormal gastric emptying study.   . Hematemesis 11/2015   mild to moderate blood in emesis appeared after 2 plus weeks of frequent, non-bloody emesis.   [name] Hemorrhoids   . Hemorrhoids   . History of chicken pox   . Hypokalemia   . Hypothyroid   . Lupus   . 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)   . Rheumatoid arthritis [facility])     Patient Active Problem List   Diagnosis Date Noted  . Iron deficiency anemia 07/02/2016  . Protein-calorie malnutrition, severe 12/12/2015  . Symptomatic anemia 12/09/2015  . Acute hyponatremia 12/09/2015  . Hematemesis  12/09/2015  . Chronic hoarseness 12/09/2015  . Unintended weight loss 12/09/2015  . 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 DDD (degenerative disc disease), cervical 06/16/2014  . 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 DDD (degenerative disc disease), lumbosacral 06/16/2014  . Chronic abdominal and back  pain 07/26/2013  . Nausea and vomiting 07/26/2013  . 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 Rheumatoid arthritis ([place]) 07/26/2013  . UTI (lower urinary tract infection) 07/26/2013  . Uncontrolled pain 07/26/2013  . Acute on chronic renal failure ([place]) 06/27/2013  . Colitis, infectious 06/25/2013  . AKI (acute kidney injury) ([place]) 06/25/2013  . Transaminitis 06/25/2013  . Infectious colitis 06/25/2013  . Abdominal pain 11/26/2012  . Hypothyroidism 11/26/2012    Past Surgical History:  Procedure Laterality Date  . ABDOMINAL HYSTERECTOMY     total  . CESAREAN SECTION     x2  . CHOLECYSTECTOMY    . COLONOSCOPY  11/2013   at [facility] Dr Kennith Gain. Hemorrhoids. sigmoid tics. random biopsies negative for microscopic colitis or any pathology.   . ESOPHAGOGASTRODUODENOSCOPY  11/2013   Dr Kennith Gain at [facility].  Normal study.  Duodenal bx negative for villous atrophy.   . ESOPHAGOGASTRODUODENOSCOPY  N/A 12/09/2015   Procedure: ESOPHAGOGASTRODUODENOSCOPY (EGD);  Surgeon: Doran Stabler, MD;  Location: [facility] ENDOSCOPY;  Service: Endoscopy;  Laterality: N/A;  . KNEE SURGERY Left   . OOPHORECTOMY    . REPLACEMENT TOTAL KNEE Bilateral     OB History    No data available       Home Medications    Prior to Admission medications   Medication Sig Start Date End Date Taking? Authorizing Provider  aspirin-acetaminophen-caffeine (EXCEDRIN MIGRAINE) [phone] MG tablet Take 1 tablet by mouth every 8 (eight) hours as needed for headache or migraine.    Yes [provider]  dicyclomine (BENTYL) 20 MG tablet Take 20 mg by mouth daily as needed for  spasms.  05/08/16  Yes [provider]  diphenoxylate-atropine (LOMOTIL) 2.5-0.025 MG tablet Take 1 tablet by mouth 4 (four) times daily as needed for diarrhea or loose stools. 12/11/16 12/11/17 Yes Earleen Newport, MD  gabapentin (NEURONTIN) 600 MG tablet Take 600 mg by mouth 2 (two) times daily. 11/15/15  Yes [provider]  levothyroxine (SYNTHROID, LEVOTHROID) 100 MCG tablet Take 350 mcg by mouth daily before breakfast.    Yes [provider]  Melatonin 3 MG TABS Take 3 mg by mouth at bedtime as needed for sleep. 01/24/16  Yes [provider]  promethazine (PHENERGAN) 25 MG tablet Take 1 tablet (25 mg total) by mouth every 6 (six) hours as needed for nausea or vomiting. 12/11/16  Yes Earleen Newport, MD  traMADol (ULTRAM) 50 MG tablet Take 1 tablet (50 mg total) by mouth every 6 (six) hours as needed. Patient taking differently: Take 50 mg by mouth every 6 (six) hours as needed (for pain).  12/10/16  Yes Lisa Roca, MD  HYDROcodone-acetaminophen (NORCO/VICODIN) 5-325 MG tablet Take 1 tablet by mouth every 4 (four) hours as needed. 02/07/17   Isla Pence, MD  ondansetron (ZOFRAN ODT) 4 MG disintegrating tablet Take 1 tablet (4 mg total) by mouth every 8 (eight) hours as needed. 02/07/17   Isla Pence, MD    Family History Family History  Problem Relation Age of Onset  . Leukemia Mother   . Arthritis Mother   . Early death Mother   . Miscarriages / [place] Mother   . Hypothyroidism Sister   . Cancer Paternal Grandmother   . Cancer Paternal Grandfather   . Hypothyroidism Brother     Social History Social History   Tobacco Use  . Smoking status: Never Smoker  . Smokeless tobacco: Never Used  Substance Use Topics  . Alcohol use: No  . Drug use: No     Allergies   Methotrexate derivatives; Morphine and related; Relafen [nabumetone]; Haloperidol; and Morphine   Review of Systems Review of Systems  Gastrointestinal:  Positive for abdominal pain, nausea and vomiting.  All other systems reviewed and are negative.    Physical Exam Updated Vital Signs BP 117/81   Pulse 67   Temp 98.8 F (37.1 C) (Oral)   Resp 16   SpO2 100%   Physical Exam  Constitutional: She is oriented to person, place, and time. She appears well-developed and well-nourished.  HENT:  Head: Normocephalic and atraumatic.  Mouth/Throat: Oropharynx is clear and moist.  Eyes: EOM are normal. Pupils are equal, round, and reactive to light.  Cardiovascular: Normal rate, regular rhythm, normal heart sounds and intact distal pulses.  Pulmonary/Chest: Effort normal and breath sounds normal.  Abdominal: Soft. Normal appearance, normal aorta and bowel sounds are normal. There is  tenderness in the right upper quadrant.  Neurological: She is alert and oriented to person, place, and time.  Skin: Skin is warm and dry. Capillary refill takes less than 2 seconds.  Psychiatric: She has a normal mood and affect. Her behavior is normal.  Nursing note and vitals reviewed.    ED Treatments / Results  Labs (all labs ordered are listed, but only abnormal results are displayed) Labs Reviewed  COMPREHENSIVE METABOLIC PANEL - Abnormal; Notable for the following components:      Result Value   CO2 20 (*)    Creatinine, Ser 1.20 (*)    Total Protein 8.7 (*)    AST 46 (*)    GFR calc non Af Amer 51 (*)    GFR calc Af Amer 59 (*)    All other components within normal limits  CBC - Abnormal; Notable for the following components:   Hemoglobin 11.9 (*)    All other components within normal limits  URINALYSIS, ROUTINE W REFLEX MICROSCOPIC - Abnormal; Notable for the following components:   Color, Urine STRAW (*)    Leukocytes, UA TRACE (*)    Bacteria, UA MANY (*)    Squamous Epithelial / LPF 6-30 (*)    All other components within normal limits  LIPASE, BLOOD    EKG  EKG Interpretation None       Radiology Ct Abdomen Pelvis W  Contrast  Result Date: 02/07/2017 CLINICAL DATA:  51 year old female with abdominal pain, nausea and vomiting for the past day. History of colitis and cyclic vomiting syndrome. Initial encounter. EXAM: CT ABDOMEN AND PELVIS WITH CONTRAST TECHNIQUE: Multidetector CT imaging of the abdomen and pelvis was performed using the standard protocol following bolus administration of intravenous contrast. CONTRAST:  <See Chart> ISOVUE-300 IOPAMIDOL (ISOVUE-300) INJECTION 61% COMPARISON:  12/10/2016, 12/09/2015 and 03/04/2014 CT. FINDINGS: Lower chest: No worrisome lung base abnormality. Heart size within normal limits. Hepatobiliary: No worrisome hepatic lesion. Post cholecystectomy. No calcified common bile duct stone. Pancreas: No pancreatic mass or inflammation. Spleen: No splenic mass or enlargement. Adrenals/Urinary Tract: No obstructing stone or hydronephrosis. No worrisome renal or adrenal mass. Noncontrast filled imaging of the urinary bladder without gross abnormality. Stomach/Bowel: Descending colon and proximal sigmoid colon are under distended limiting evaluation for detection of subtle abnormality however, no extraluminal bowel inflammatory process is noted. No inflammation surrounds the appendix. No gastric or duodenal abnormality noted. Vascular/Lymphatic: Calcified plaque of aorta without aneurysm. No major vessel occlusion. No adenopathy. Reproductive: Post hysterectomy.  No worrisome adnexal mass. Other: Mild diastases rectus muscles.  No bowel containing hernia. Prominent subcutaneous venous structures incidentally noted. Musculoskeletal: Mild scoliosis convex left. No focal compression fracture or worrisome osseous lesion. IMPRESSION: Descending colon and proximal sigmoid colon are under distended limiting evaluation for detection of subtle abnormality however, no extraluminal bowel inflammatory process is noted. No inflammation surrounds the appendix. No gastric or duodenal abnormality noted. Aortic  Atherosclerosis (ICD10-I70.0). Post cholecystectomy and hysterectomy. Electronically Signed   By: Genia Del M.D.   On: 02/07/2017 18:46   Dg Chest Port 1 View  Result Date: 02/07/2017 CLINICAL DATA:  Pain with nausea and vomiting EXAM: PORTABLE CHEST 1 VIEW COMPARISON:  December 20, 2015 FINDINGS: Port-A-Cath tip is in the superior vena cava. There is no edema or consolidation. No pneumothorax. No adenopathy. No bone lesions. IMPRESSION: Port-A-Cath in superior vena cava without pneumothorax. No edema or consolidation. Electronically Signed   By: Lowella Grip III M.D.   On: 02/07/2017 16:06  Procedures Procedures (including critical care time)  Medications Ordered in ED Medications  sodium chloride 0.9 % bolus 1,000 mL (1,000 mLs Intravenous New Bag/Given 02/07/17 1742)    And  0.9 %  sodium chloride infusion (not administered)  fentaNYL (SUBLIMAZE) injection 50 mcg (50 mcg Intravenous Given 02/07/17 1741)  ondansetron (ZOFRAN) injection 4 mg (4 mg Intravenous Given 02/07/17 1738)  iopamidol (ISOVUE-300) 61 % injection (100 mLs  Contrast Given 02/07/17 1804)  HYDROmorphone (DILAUDID) injection 1 mg (1 mg Intravenous Given 02/07/17 1851)     Initial Impression / Assessment and Plan / ED Course  I have reviewed the triage vital signs and the nursing notes.  Pertinent labs & imaging results that were available during my care of the patient were reviewed by me and considered in my medical decision making (see chart for details).    Pt is feeling much better.  She has nothing I can fix in the ED.  She is instructed to f/u with her pcp and with GI.  Return if worse.  Final Clinical Impressions(s) / ED Diagnoses   Final diagnoses:  Generalized abdominal pain    ED Discharge Orders        Ordered    ondansetron (ZOFRAN ODT) 4 MG disintegrating tablet  Every 8 hours PRN     02/07/17 1901    HYDROcodone-acetaminophen (NORCO/VICODIN) 5-325 MG tablet  Every 4 hours PRN      02/07/17 1901       Isla Pence, MD 02/07/17 1905

## 2017-02-07 NOTE — ED Notes (Signed)
Patient transported to CT 

## 2017-02-07 NOTE — ED Notes (Signed)
Ct notified that pt is ready for scan

## 2017-02-07 NOTE — ED Notes (Signed)
This Rn attempted port a cath access once without success, Iv team consulted

## 2017-03-09 ENCOUNTER — Emergency Department: Payer: Medicare Other

## 2017-03-09 ENCOUNTER — Emergency Department
Admission: EM | Admit: 2017-03-09 | Discharge: 2017-03-09 | Disposition: A | Discharge status: Home / Self Care | Payer: Medicare Other | Attending: Emergency Medicine | Admitting: Emergency Medicine

## 2017-03-09 DIAGNOSIS — J069 Acute upper respiratory infection, unspecified: Secondary | ICD-10-CM

## 2017-03-09 DIAGNOSIS — H7292 Unspecified perforation of tympanic membrane, left ear: Secondary | ICD-10-CM | POA: Diagnosis not present

## 2017-03-09 DIAGNOSIS — E039 Hypothyroidism, unspecified: Secondary | ICD-10-CM | POA: Diagnosis not present

## 2017-03-09 DIAGNOSIS — Z79899 Other long term (current) drug therapy: Secondary | ICD-10-CM | POA: Insufficient documentation

## 2017-03-09 DIAGNOSIS — B9789 Other viral agents as the cause of diseases classified elsewhere: Secondary | ICD-10-CM | POA: Insufficient documentation

## 2017-03-09 DIAGNOSIS — H6692 Otitis media, unspecified, left ear: Secondary | ICD-10-CM | POA: Diagnosis not present

## 2017-03-09 DIAGNOSIS — Z96653 Presence of artificial knee joint, bilateral: Secondary | ICD-10-CM | POA: Diagnosis not present

## 2017-03-09 DIAGNOSIS — H9202 Otalgia, left ear: Secondary | ICD-10-CM | POA: Diagnosis present

## 2017-03-09 MED ORDER — IBUPROFEN 600 MG PO TABS
600.0000 mg | ORAL_TABLET | Freq: Once | ORAL | Status: AC
  Administered 2017-03-09: 600 mg via ORAL
  Filled 2017-03-09: qty 1

## 2017-03-09 MED ORDER — HYDROCODONE-ACETAMINOPHEN 5-325 MG PO TABS
1.0000 | ORAL_TABLET | Freq: Once | ORAL | Status: AC
  Administered 2017-03-09: 1 via ORAL
  Filled 2017-03-09: qty 1

## 2017-03-09 MED ORDER — HYDROCODONE-ACETAMINOPHEN 5-325 MG PO TABS: 1.0000 | ORAL_TABLET | Freq: Four times a day (QID) | ORAL | 0 refills | Status: DC | PRN

## 2017-03-09 NOTE — Discharge Instructions (Addendum)
Please continue taking all of your antibiotics as prescribed and keep your follow-up with the ear nose and throat specialist as scheduled.  Return to the emergency department for any concerns such as fevers, chills, worsening pain, or for any other issues whatsoever.  It was a pleasure to take care of you today, and thank you for coming to our emergency department.  If you have any questions or concerns before leaving please ask the nurse to grab me and I'm more than happy to go through your aftercare instructions again.  If you were prescribed any opioid pain medication today such as Norco, Vicodin, Percocet, morphine, hydrocodone, or oxycodone please make sure you do not drive when you are taking this medication as it can alter your ability to drive safely.  If you have any concerns once you are home that you are not improving or are in fact getting worse before you can make it to your follow-up appointment, please do not hesitate to call 911 and come back for further evaluation.  Darel Hong, MD  Results for orders placed or performed during the hospital encounter of 02/07/17  Lipase, blood  Result Value Ref Range   Lipase 35 11 - 51 U/L  Comprehensive metabolic panel  Result Value Ref Range   Sodium 137 135 - 145 mmol/L   Potassium 3.8 3.5 - 5.1 mmol/L   Chloride 104 101 - 111 mmol/L   CO2 20 (L) 22 - 32 mmol/L   Glucose, Bld 69 65 - 99 mg/dL   BUN 11 6 - 20 mg/dL   Creatinine, Ser 1.20 (H) 0.44 - 1.00 mg/dL   Calcium 9.1 8.9 - 10.3 mg/dL   Total Protein 8.7 (H) 6.5 - 8.1 g/dL   Albumin 4.2 3.5 - 5.0 g/dL   AST 46 (H) 15 - 41 U/L   ALT 41 14 - 54 U/L   Alkaline Phosphatase 109 38 - 126 U/L   Total Bilirubin 0.6 0.3 - 1.2 mg/dL   GFR calc non Af Amer 51 (L) >60 mL/min   GFR calc Af Amer 59 (L) >60 mL/min   Anion gap 13 5 - 15  CBC  Result Value Ref Range   WBC 5.6 4.0 - 10.5 K/uL   RBC 3.95 3.87 - 5.11 MIL/uL   Hemoglobin 11.9 (L) 12.0 - 15.0 g/dL   HCT 36.5 36.0 - 46.0 %     MCV 92.4 78.0 - 100.0 fL   MCH 30.1 26.0 - 34.0 pg   MCHC 32.6 30.0 - 36.0 g/dL   RDW 15.0 11.5 - 15.5 %   Platelets 212 150 - 400 K/uL  Urinalysis, Routine w reflex microscopic  Result Value Ref Range   Color, Urine STRAW (A) YELLOW   APPearance CLEAR CLEAR   Specific Gravity, Urine 1.008 1.005 - 1.030   pH 7.0 5.0 - 8.0   Glucose, UA NEGATIVE NEGATIVE mg/dL   Hgb urine dipstick NEGATIVE NEGATIVE   Bilirubin Urine NEGATIVE NEGATIVE   Ketones, ur NEGATIVE NEGATIVE mg/dL   Protein, ur NEGATIVE NEGATIVE mg/dL   Nitrite NEGATIVE NEGATIVE   Leukocytes, UA TRACE (A) NEGATIVE   RBC / HPF 0-5 0 - 5 RBC/hpf   WBC, UA 0-5 0 - 5 WBC/hpf   Bacteria, UA MANY (A) NONE SEEN   Squamous Epithelial / LPF 6-30 (A) NONE SEEN   Dg Chest 2 View  Result Date: 03/09/2017 CLINICAL DATA:  Cough, congestion, and shortness of breath beginning today. EXAM: CHEST  2 VIEW COMPARISON:  02/07/2017 FINDINGS: Power port type central venous catheter with tip over the upper SVC region. No pneumothorax. Normal heart size and pulmonary vascularity. No focal airspace disease or consolidation in the lungs. No blunting of costophrenic angles. No pneumothorax. Mediastinal contours appear intact. Surgical clips in the right upper quadrant. IMPRESSION: No active cardiopulmonary disease. Electronically Signed   By: Lucienne Capers M.D.   On: 03/09/2017 06:25   Ct Abdomen Pelvis W Contrast  Result Date: 02/07/2017 CLINICAL DATA:  52 year old female with abdominal pain, nausea and vomiting for the past day. History of colitis and cyclic vomiting syndrome. Initial encounter. EXAM: CT ABDOMEN AND PELVIS WITH CONTRAST TECHNIQUE: Multidetector CT imaging of the abdomen and pelvis was performed using the standard protocol following bolus administration of intravenous contrast. CONTRAST:  <See Chart> ISOVUE-300 IOPAMIDOL (ISOVUE-300) INJECTION 61% COMPARISON:  12/10/2016, 12/09/2015 and 03/04/2014 CT. FINDINGS: Lower chest: No  worrisome lung base abnormality. Heart size within normal limits. Hepatobiliary: No worrisome hepatic lesion. Post cholecystectomy. No calcified common bile duct stone. Pancreas: No pancreatic mass or inflammation. Spleen: No splenic mass or enlargement. Adrenals/Urinary Tract: No obstructing stone or hydronephrosis. No worrisome renal or adrenal mass. Noncontrast filled imaging of the urinary bladder without gross abnormality. Stomach/Bowel: Descending colon and proximal sigmoid colon are under distended limiting evaluation for detection of subtle abnormality however, no extraluminal bowel inflammatory process is noted. No inflammation surrounds the appendix. No gastric or duodenal abnormality noted. Vascular/Lymphatic: Calcified plaque of aorta without aneurysm. No major vessel occlusion. No adenopathy. Reproductive: Post hysterectomy.  No worrisome adnexal mass. Other: Mild diastases rectus muscles.  No bowel containing hernia. Prominent subcutaneous venous structures incidentally noted. Musculoskeletal: Mild scoliosis convex left. No focal compression fracture or worrisome osseous lesion. IMPRESSION: Descending colon and proximal sigmoid colon are under distended limiting evaluation for detection of subtle abnormality however, no extraluminal bowel inflammatory process is noted. No inflammation surrounds the appendix. No gastric or duodenal abnormality noted. Aortic Atherosclerosis (ICD10-I70.0). Post cholecystectomy and hysterectomy. Electronically Signed   By: Genia Del M.D.   On: 02/07/2017 18:46   Dg Chest Port 1 View  Result Date: 02/07/2017 CLINICAL DATA:  Pain with nausea and vomiting EXAM: PORTABLE CHEST 1 VIEW COMPARISON:  December 20, 2015 FINDINGS: Port-A-Cath tip is in the superior vena cava. There is no edema or consolidation. No pneumothorax. No adenopathy. No bone lesions. IMPRESSION: Port-A-Cath in superior vena cava without pneumothorax. No edema or consolidation. Electronically Signed    By: Lowella Grip III M.D.   On: 02/07/2017 16:06

## 2017-03-09 NOTE — ED Provider Notes (Signed)
[facility] Emergency Department Provider Note  ____________________________________________   First MD Initiated Contact with Patient 03/09/17 [phone]     (approximate)  I have reviewed the triage vital signs and the nursing notes.   HISTORY  Chief Complaint No chief complaint on file.   HPI Adrienne Flores is a 52 y.o. female comes to the emergency department with her husband for evaluation of bleeding from her left ear.  She has had an upper respiratory tract infection for several days and yesterday was seen at [facility] and was diagnosed with perforated left tympanic membrane with otitis media.  She was prescribed amoxicillin and ibuprofen which she has been taking.  At this evening she sneezed with a closed mouth causing sudden onset severe pain in her left ear and she began to bleed from her left ear.  She also noted some "chest heaviness" nonexertional nonradiating which prompted the visit today.  Past Medical History:  Diagnosis Date  . Anemia   . At high risk for falls   . Chronic abdominal pain 2008   dates back to at least 2008.  multiple ultrasound, CT scans, x rays at [facility], [place].  [facility] colonoscopy, egd unrevealing.    . Colitis 2015   ? colitis per CTs in 2015, 2016 and 11/2015.  presumed infectious.  colonoscopy in 2015 negative for colitis.   . Constipation   . Cyclic vomiting syndrome 2015   per opinion  of Dr Granville Lewis, IBS specialist at [facility].   . Degenerative disc disease   . Depression   . Fibromyalgia   . Gastroparesis 03/2011   markedly abnormal gastric emptying study.   . Hematemesis 11/2015   mild to moderate blood in emesis appeared after 2 plus weeks of frequent, non-bloody emesis.   [name] Hemorrhoids   . Hemorrhoids   . History of chicken pox   . Hypokalemia   . Hypothyroid   . Lupus   . 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 Rheumatoid arthritis [facility])     Patient Active Problem List   Diagnosis Date Noted  . Iron  deficiency anemia 07/02/2016  . Protein-calorie malnutrition, severe 12/12/2015  . Symptomatic anemia 12/09/2015  . Acute hyponatremia 12/09/2015  . Hematemesis 12/09/2015  . Chronic hoarseness 12/09/2015  . Unintended weight loss 12/09/2015  . 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 DDD (degenerative disc disease), cervical 06/16/2014  . 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 DDD (degenerative disc disease), lumbosacral 06/16/2014  . Chronic abdominal and back  pain 07/26/2013  . Nausea and vomiting 07/26/2013  . 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 Rheumatoid arthritis ([place]) 07/26/2013  . UTI (lower urinary tract infection) 07/26/2013  . Uncontrolled pain 07/26/2013  . Acute on chronic renal failure ([place]) 06/27/2013  . Colitis, infectious 06/25/2013  . AKI (acute kidney injury) ([place]) 06/25/2013  . Transaminitis 06/25/2013  . Infectious colitis 06/25/2013  . Abdominal pain 11/26/2012  . Hypothyroidism 11/26/2012    Past Surgical History:  Procedure Laterality Date  . ABDOMINAL HYSTERECTOMY     total  . CESAREAN SECTION     x2  . CHOLECYSTECTOMY    . COLONOSCOPY  11/2013   at [facility] Dr Kennith Gain. Hemorrhoids. sigmoid tics. random biopsies negative for microscopic colitis or any pathology.   . ESOPHAGOGASTRODUODENOSCOPY  11/2013   Dr Kennith Gain at [facility].  Normal study.  Duodenal bx negative for villous atrophy.   . ESOPHAGOGASTRODUODENOSCOPY N/A 12/09/2015  Procedure: ESOPHAGOGASTRODUODENOSCOPY (EGD);  Surgeon: Doran Stabler, MD;  Location: [facility] ENDOSCOPY;  Service: Endoscopy;  Laterality: N/A;  . KNEE SURGERY Left   . OOPHORECTOMY    . REPLACEMENT TOTAL KNEE Bilateral     Prior to Admission medications   Medication Sig Start Date End Date Taking? Authorizing Provider  aspirin-acetaminophen-caffeine (EXCEDRIN MIGRAINE) [phone] MG tablet Take 1 tablet by mouth every 8 (eight) hours as needed for headache or migraine.      [provider]  dicyclomine (BENTYL) 20 MG tablet Take 20 mg by mouth daily as needed for spasms.  05/08/16   [provider]  diphenoxylate-atropine (LOMOTIL) 2.5-0.025 MG tablet Take 1 tablet by mouth 4 (four) times daily as needed for diarrhea or loose stools. 12/11/16 12/11/17  Earleen Newport, MD  gabapentin (NEURONTIN) 600 MG tablet Take 600 mg by mouth 2 (two) times daily. 11/15/15   [provider]  HYDROcodone-acetaminophen (NORCO/VICODIN) 5-325 MG tablet Take 1 tablet by mouth every 4 (four) hours as needed. 02/07/17   Isla Pence, MD  levothyroxine (SYNTHROID, LEVOTHROID) 100 MCG tablet Take 350 mcg by mouth daily before breakfast.     [provider]  Melatonin 3 MG TABS Take 3 mg by mouth at bedtime as needed for sleep. 01/24/16   [provider]  ondansetron (ZOFRAN ODT) 4 MG disintegrating tablet Take 1 tablet (4 mg total) by mouth every 8 (eight) hours as needed. 02/07/17   Isla Pence, MD  promethazine (PHENERGAN) 25 MG tablet Take 1 tablet (25 mg total) by mouth every 6 (six) hours as needed for nausea or vomiting. 12/11/16   Earleen Newport, MD  traMADol (ULTRAM) 50 MG tablet Take 1 tablet (50 mg total) by mouth every 6 (six) hours as needed. Patient taking differently: Take 50 mg by mouth every 6 (six) hours as needed (for pain).  12/10/16   Lisa Roca, MD    Allergies Methotrexate derivatives; Morphine and related; Relafen [nabumetone]; Haloperidol; and Morphine  Family History  Problem Relation Age of Onset  . Leukemia Mother   . Arthritis Mother   . Early death Mother   . Miscarriages / [place] Mother   . Hypothyroidism Sister   . Cancer Paternal Grandmother   . Cancer Paternal Grandfather   . Hypothyroidism Brother     Social History Social History   Tobacco Use  . Smoking status: Never Smoker  . Smokeless tobacco: Never Used  Substance Use Topics  . Alcohol use: No  . Drug use: No     Review of Systems Constitutional: No fever/chills ENT: No sore throat. Cardiovascular: Positive for chest pain. Respiratory: Positive for shortness of breath. Gastrointestinal: No abdominal pain.  No nausea, no vomiting.  No diarrhea.  No constipation. Musculoskeletal: Negative for back pain. Neurological: Negative for headaches   ____________________________________________   PHYSICAL EXAM:  VITAL SIGNS: ED Triage Vitals  Enc Vitals Group     BP      Pulse      Resp      Temp      Temp src      SpO2      Weight      Height      Head Circumference      Peak Flow      Pain Score      Pain Loc      Pain Edu?      Excl. in [place]?     Constitutional: Alert and oriented x4 hoarse  voice appears chronically ill Head: Atraumatic.  Normal right tympanic membrane left tympanic membrane perforated with thin serous discharge No mastoid tenderness Nose: No congestion/rhinnorhea. Mouth/Throat: No trismus Neck: No stridor.  No meningismus Cardiovascular: Regular rate and rhythm Respiratory: Normal respiratory effort.  No retractions.  Clear to auscultation bilaterally Gastrointestinal: Soft nontender Neurologic:  Normal speech and language. No gross focal neurologic deficits are appreciated.  Skin:  Skin is warm, dry and intact. No rash noted.    ____________________________________________  LABS (all labs ordered are listed, but only abnormal results are displayed)  Labs Reviewed - No data to display   __________________________________________  EKG  ED ECG REPORT I, Darel Hong, the attending physician, personally viewed and interpreted this ECG.  Date: 03/09/2017 EKG Time:  Rate: 74 Rhythm: normal sinus rhythm QRS Axis: Rightward axis Intervals: normal ST/T Wave abnormalities: normal Narrative Interpretation: no evidence of acute ischemia.  Low voltage but consistent with previous EKG performed December 10, 2016  ____________________________________________  RADIOLOGY  Chest x-ray reviewed by me with no acute disease ____________________________________________   DIFFERENTIAL includes but not limited to  Otitis media, mastoiditis, pneumonia, pneumothorax, influenza   PROCEDURES  Procedure(s) performed: no  Procedures  Critical Care performed: no  Observation: no ____________________________________________   INITIAL IMPRESSION / ASSESSMENT AND PLAN / ED COURSE  Pertinent labs & imaging results that were available during my care of the patient were reviewed by me and considered in my medical decision making (see chart for details).  The patient has clear perforated otitis media on the left.  No evidence of mastoiditis or meningitis.  EKG is consistent with previous and chest x-ray with no evidence of pneumonia.  She does have some thin bloody serous discharge from the left.  After ibuprofen and hydrocodone her symptoms are improved.  I have given her reassurance and will prescribe her a short course of hydrocodone for home.  Strict return precautions have been given and the patient verbalized understanding agreement the plan.  She already has ENT follow-up through [facility].      ____________________________________________   FINAL CLINICAL IMPRESSION(S) / ED DIAGNOSES  Final diagnoses:  None      NEW MEDICATIONS STARTED DURING THIS VISIT:  New Prescriptions   No medications on file     Note:  This document was prepared using Dragon voice recognition software and may include unintentional dictation errors.      Darel Hong, MD 03/09/17 [phone]

## 2017-03-09 NOTE — ED Triage Notes (Signed)
Pt presents to ed by POV with complaints of left ear pain. Pt states she sneezed hard and her ear starting bleeding after. Pt states "blood started pouring out after I sneezed and that's when it started hurting really bad. Pt recently had an ear infections a week ago. Pt appears to be tearful and having trouble hearing during triage.

## 2017-03-11 ENCOUNTER — Emergency Department: Payer: Medicare Other

## 2017-03-11 ENCOUNTER — Other Ambulatory Visit: Payer: Self-pay

## 2017-03-11 ENCOUNTER — Encounter: Payer: Self-pay | Admitting: Emergency Medicine

## 2017-03-11 DIAGNOSIS — Y999 Unspecified external cause status: Secondary | ICD-10-CM | POA: Insufficient documentation

## 2017-03-11 DIAGNOSIS — J019 Acute sinusitis, unspecified: Secondary | ICD-10-CM | POA: Diagnosis not present

## 2017-03-11 DIAGNOSIS — Y929 Unspecified place or not applicable: Secondary | ICD-10-CM | POA: Diagnosis not present

## 2017-03-11 DIAGNOSIS — S0922XA Traumatic rupture of left ear drum, initial encounter: Secondary | ICD-10-CM | POA: Insufficient documentation

## 2017-03-11 DIAGNOSIS — H109 Unspecified conjunctivitis: Secondary | ICD-10-CM | POA: Diagnosis not present

## 2017-03-11 DIAGNOSIS — R0981 Nasal congestion: Secondary | ICD-10-CM | POA: Diagnosis present

## 2017-03-11 DIAGNOSIS — Y33XXXA Other specified events, undetermined intent, initial encounter: Secondary | ICD-10-CM | POA: Insufficient documentation

## 2017-03-11 DIAGNOSIS — E039 Hypothyroidism, unspecified: Secondary | ICD-10-CM | POA: Diagnosis not present

## 2017-03-11 DIAGNOSIS — Z79899 Other long term (current) drug therapy: Secondary | ICD-10-CM | POA: Insufficient documentation

## 2017-03-11 DIAGNOSIS — Z96653 Presence of artificial knee joint, bilateral: Secondary | ICD-10-CM | POA: Diagnosis not present

## 2017-03-11 DIAGNOSIS — R079 Chest pain, unspecified: Secondary | ICD-10-CM | POA: Diagnosis not present

## 2017-03-11 DIAGNOSIS — Y939 Activity, unspecified: Secondary | ICD-10-CM | POA: Diagnosis not present

## 2017-03-11 NOTE — ED Triage Notes (Addendum)
Pt presents to ED with sob, nasal congestion, and cough. Pt states it feels like "an elephant is sitting" on her chest. Pt reports "I didn't know I was sick". Pt currently has no increased work of breathing noted. Onset yesterday.  Pt also c/o left ear pain and states its been leaking blood out of it all day. Was seen for the same and prescribed amoxicillin but states she feels like she is getting worse in stead of better.

## 2017-03-12 ENCOUNTER — Encounter: Payer: Self-pay | Admitting: Radiology

## 2017-03-12 ENCOUNTER — Emergency Department
Admission: EM | Admit: 2017-03-12 | Discharge: 2017-03-12 | Disposition: A | Discharge status: Home / Self Care | Payer: Medicare Other | Attending: Emergency Medicine | Admitting: Emergency Medicine

## 2017-03-12 ENCOUNTER — Emergency Department: Payer: Medicare Other

## 2017-03-12 DIAGNOSIS — S0922XA Traumatic rupture of left ear drum, initial encounter: Secondary | ICD-10-CM

## 2017-03-12 DIAGNOSIS — H109 Unspecified conjunctivitis: Secondary | ICD-10-CM

## 2017-03-12 DIAGNOSIS — J019 Acute sinusitis, unspecified: Secondary | ICD-10-CM

## 2017-03-12 DIAGNOSIS — B9689 Other specified bacterial agents as the cause of diseases classified elsewhere: Secondary | ICD-10-CM

## 2017-03-12 LAB — CBC WITH DIFFERENTIAL/PLATELET
BASOS PCT: 1 %
Basophils Absolute: 0.1 10*3/uL (ref 0–0.1)
EOS ABS: 0.1 10*3/uL (ref 0–0.7)
EOS PCT: 2 %
HCT: 28.6 % — ABNORMAL LOW (ref 35.0–47.0)
Hemoglobin: 9.8 g/dL — ABNORMAL LOW (ref 12.0–16.0)
Lymphocytes Relative: 18 %
Lymphs Abs: 1.2 10*3/uL (ref 1.0–3.6)
MCH: 30.3 pg (ref 26.0–34.0)
MCHC: 34.2 g/dL (ref 32.0–36.0)
MCV: 88.5 fL (ref 80.0–100.0)
Monocytes Absolute: 0.4 10*3/uL (ref 0.2–0.9)
Monocytes Relative: 5 %
Neutro Abs: 5.1 10*3/uL (ref 1.4–6.5)
Neutrophils Relative %: 74 %
PLATELETS: 200 10*3/uL (ref 150–440)
RBC: 3.23 MIL/uL — AB (ref 3.80–5.20)
RDW: 15.7 % — AB (ref 11.5–14.5)
WBC: 6.9 10*3/uL (ref 3.6–11.0)

## 2017-03-12 LAB — COMPREHENSIVE METABOLIC PANEL
ALT: 16 U/L (ref 14–54)
AST: 30 U/L (ref 15–41)
Albumin: 3.7 g/dL (ref 3.5–5.0)
Alkaline Phosphatase: 83 U/L (ref 38–126)
Anion gap: 9 (ref 5–15)
BUN: 13 mg/dL (ref 6–20)
CHLORIDE: 102 mmol/L (ref 101–111)
CO2: 24 mmol/L (ref 22–32)
CREATININE: 1.3 mg/dL — AB (ref 0.44–1.00)
Calcium: 8.4 mg/dL — ABNORMAL LOW (ref 8.9–10.3)
GFR calc Af Amer: 53 mL/min — ABNORMAL LOW (ref >60)
GFR calc non Af Amer: 46 mL/min — ABNORMAL LOW (ref >60)
Glucose, Bld: 101 mg/dL — ABNORMAL HIGH (ref 65–99)
POTASSIUM: 2.8 mmol/L — AB (ref 3.5–5.1)
SODIUM: 135 mmol/L (ref 135–145)
Total Bilirubin: 0.4 mg/dL (ref 0.3–1.2)
Total Protein: 8.1 g/dL (ref 6.5–8.1)

## 2017-03-12 LAB — TROPONIN I: Troponin I: <0.03 ng/mL (ref <0.03)

## 2017-03-12 MED ORDER — FENTANYL CITRATE (PF) 100 MCG/2ML IJ SOLN
50.0000 ug | Freq: Once | INTRAMUSCULAR | Status: AC
  Administered 2017-03-12: 50 ug via INTRAVENOUS

## 2017-03-12 MED ORDER — KETOROLAC TROMETHAMINE 30 MG/ML IJ SOLN
INTRAMUSCULAR | Status: AC
  Filled 2017-03-12: qty 1

## 2017-03-12 MED ORDER — CIPROFLOXACIN-DEXAMETHASONE 0.3-0.1 % OT SUSP
4.0000 [drp] | Freq: Once | OTIC | Status: AC
  Administered 2017-03-12: 4 [drp] via OTIC
  Filled 2017-03-12: qty 7.5

## 2017-03-12 MED ORDER — CEFTRIAXONE SODIUM IN DEXTROSE 20 MG/ML IV SOLN
INTRAVENOUS | Status: AC
  Filled 2017-03-12: qty 50

## 2017-03-12 MED ORDER — CLINDAMYCIN HCL 300 MG PO CAPS: 300.0000 mg | ORAL_CAPSULE | Freq: Three times a day (TID) | ORAL | 0 refills | Status: AC

## 2017-03-12 MED ORDER — FENTANYL CITRATE (PF) 100 MCG/2ML IJ SOLN
INTRAMUSCULAR | Status: AC
  Filled 2017-03-12: qty 2

## 2017-03-12 MED ORDER — AMOXICILLIN-POT CLAVULANATE 875-125 MG PO TABS: 1.0000 | ORAL_TABLET | Freq: Two times a day (BID) | ORAL | 0 refills | Status: AC

## 2017-03-12 MED ORDER — AMOXICILLIN-POT CLAVULANATE 875-125 MG PO TABS
1.0000 | ORAL_TABLET | Freq: Once | ORAL | Status: AC
  Administered 2017-03-12: 1 via ORAL
  Filled 2017-03-12: qty 1

## 2017-03-12 MED ORDER — CEFTRIAXONE SODIUM IN DEXTROSE 20 MG/ML IV SOLN
1.0000 g | Freq: Once | INTRAVENOUS | Status: AC
  Administered 2017-03-12: 1 g via INTRAVENOUS
  Filled 2017-03-12: qty 50

## 2017-03-12 MED ORDER — SODIUM CHLORIDE 0.9% FLUSH: 10.0000 mL | INTRAVENOUS | Status: DC | PRN

## 2017-03-12 MED ORDER — OXYCODONE-ACETAMINOPHEN 5-325 MG PO TABS
1.0000 | ORAL_TABLET | Freq: Once | ORAL | Status: AC
  Administered 2017-03-12: 1 via ORAL

## 2017-03-12 MED ORDER — OXYCODONE-ACETAMINOPHEN 5-325 MG PO TABS
ORAL_TABLET | ORAL | Status: AC
  Filled 2017-03-12: qty 1

## 2017-03-12 MED ORDER — ERYTHROMYCIN 5 MG/GM OP OINT
TOPICAL_OINTMENT | Freq: Once | OPHTHALMIC | Status: AC
  Administered 2017-03-12: 1 via OPHTHALMIC
  Filled 2017-03-12: qty 1

## 2017-03-12 MED ORDER — ALTEPLASE 2 MG IJ SOLR
2.0000 mg | Freq: Once | INTRAMUSCULAR | Status: AC
  Administered 2017-03-12: 2 mg
  Filled 2017-03-12: qty 2

## 2017-03-12 MED ORDER — SODIUM CHLORIDE 0.9% FLUSH: 10.0000 mL | Freq: Two times a day (BID) | INTRAVENOUS | Status: DC

## 2017-03-12 MED ORDER — FENTANYL CITRATE (PF) 100 MCG/2ML IJ SOLN
50.0000 ug | Freq: Once | INTRAMUSCULAR | Status: AC
  Administered 2017-03-12: 50 ug via INTRAVENOUS
  Filled 2017-03-12: qty 2

## 2017-03-12 MED ORDER — KETOROLAC TROMETHAMINE 30 MG/ML IJ SOLN: 30.0000 mg | Freq: Once | INTRAMUSCULAR | Status: DC

## 2017-03-12 MED ORDER — ONDANSETRON HCL 4 MG/2ML IJ SOLN
4.0000 mg | Freq: Once | INTRAMUSCULAR | Status: AC
  Administered 2017-03-12: 4 mg via INTRAVENOUS
  Filled 2017-03-12: qty 2

## 2017-03-12 MED ORDER — IOPAMIDOL (ISOVUE-370) INJECTION 76%
75.0000 mL | Freq: Once | INTRAVENOUS | Status: AC | PRN
  Administered 2017-03-12: 75 mL via INTRAVENOUS

## 2017-03-12 NOTE — ED Provider Notes (Signed)
[facility] Emergency Department Provider Note   First MD Initiated Contact with Patient 03/12/17 0102     (approximate)  I have reviewed the triage vital signs and the nursing notes.   HISTORY  Chief Complaint Shortness of Breath; Chest Pain; Nasal Congestion; and Cough    HPI Adrienne Flores is a 52 y.o. female with below list of chronic medical conditions presents to the emergency department with continued nasal congestion nonproductive cough bilateral ear pain worse in the left ear patient states current pain score is 10 out of 10.  Patient recently diagnosed with a left TM perforation for which she is currently taking amoxicillin however she states that symptoms are progressively worsened since she began taking the amoxicillin.  Patient also admits to bilateral eye drainage with matting of the eyelashes on awakening.   Past Medical History:  Diagnosis Date  . Anemia   . At high risk for falls   . Chronic abdominal pain 2008   dates back to at least 2008.  multiple ultrasound, CT scans, x rays at [facility], [place].  [facility] colonoscopy, egd unrevealing.    . Colitis 2015   ? colitis per CTs in 2015, 2016 and 11/2015.  presumed infectious.  colonoscopy in 2015 negative for colitis.   . Constipation   . Cyclic vomiting syndrome 2015   per opinion  of Dr Granville Lewis, IBS specialist at [facility].   . Degenerative disc disease   . Depression   . Fibromyalgia   . Gastroparesis 03/2011   markedly abnormal gastric emptying study.   . Hematemesis 11/2015   mild to moderate blood in emesis appeared after 2 plus weeks of frequent, non-bloody emesis.   [name] Hemorrhoids   . Hemorrhoids   . History of chicken pox   . Hypokalemia   . Hypothyroid   . Lupus   . 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 Rheumatoid arthritis [facility])     Patient Active Problem List   Diagnosis Date Noted  . Iron deficiency anemia 07/02/2016  . Protein-calorie malnutrition, severe 12/12/2015  .  Symptomatic anemia 12/09/2015  . Acute hyponatremia 12/09/2015  . Hematemesis 12/09/2015  . Chronic hoarseness 12/09/2015  . Unintended weight loss 12/09/2015  . 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 DDD (degenerative disc disease), cervical 06/16/2014  . 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 DDD (degenerative disc disease), lumbosacral 06/16/2014  . Chronic abdominal and back  pain 07/26/2013  . Nausea and vomiting 07/26/2013  . 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 Rheumatoid arthritis ([place]) 07/26/2013  . UTI (lower urinary tract infection) 07/26/2013  . Uncontrolled pain 07/26/2013  . Acute on chronic renal failure ([place]) 06/27/2013  . Colitis, infectious 06/25/2013  . AKI (acute kidney injury) ([place]) 06/25/2013  . Transaminitis 06/25/2013  . Infectious colitis 06/25/2013  . Abdominal pain 11/26/2012  . Hypothyroidism 11/26/2012    Past Surgical History:  Procedure Laterality Date  . ABDOMINAL HYSTERECTOMY     total  . CESAREAN SECTION     x2  . CHOLECYSTECTOMY    . COLONOSCOPY  11/2013   at [facility] Dr Kennith Gain. Hemorrhoids. sigmoid tics. random biopsies negative for microscopic colitis or any pathology.   . ESOPHAGOGASTRODUODENOSCOPY  11/2013   Dr Kennith Gain at [facility].  Normal study.  Duodenal bx negative for villous atrophy.   . ESOPHAGOGASTRODUODENOSCOPY N/A 12/09/2015   Procedure: ESOPHAGOGASTRODUODENOSCOPY (EGD);  Surgeon: Doran Stabler, MD;  Location: [facility]  ENDOSCOPY;  Service: Endoscopy;  Laterality: N/A;  . KNEE SURGERY Left   . OOPHORECTOMY    . REPLACEMENT TOTAL KNEE Bilateral     Prior to Admission medications   Medication Sig Start Date End Date Taking? Authorizing Provider  amoxicillin-clavulanate (AUGMENTIN) 875-125 MG tablet Take 1 tablet by mouth 2 (two) times daily for 10 days. 03/12/17 03/22/17  Gregor Hams, MD  aspirin-acetaminophen-caffeine (EXCEDRIN MIGRAINE) [phone] MG tablet Take 1 tablet by mouth every  8 (eight) hours as needed for headache or migraine.     [provider]  clindamycin (CLEOCIN) 300 MG capsule Take 1 capsule (300 mg total) by mouth 3 (three) times daily for 10 days. 03/12/17 03/22/17  Gregor Hams, MD  dicyclomine (BENTYL) 20 MG tablet Take 20 mg by mouth daily as needed for spasms.  05/08/16   [provider]  diphenoxylate-atropine (LOMOTIL) 2.5-0.025 MG tablet Take 1 tablet by mouth 4 (four) times daily as needed for diarrhea or loose stools. 12/11/16 12/11/17  Earleen Newport, MD  gabapentin (NEURONTIN) 600 MG tablet Take 600 mg by mouth 2 (two) times daily. 11/15/15   [provider]  HYDROcodone-acetaminophen (NORCO) 5-325 MG tablet Take 1 tablet by mouth every 6 (six) hours as needed for up to 7 doses for severe pain. 03/09/17   Darel Hong, MD  HYDROcodone-acetaminophen (NORCO/VICODIN) 5-325 MG tablet Take 1 tablet by mouth every 4 (four) hours as needed. 02/07/17   Isla Pence, MD  levothyroxine (SYNTHROID, LEVOTHROID) 100 MCG tablet Take 350 mcg by mouth daily before breakfast.     [provider]  Melatonin 3 MG TABS Take 3 mg by mouth at bedtime as needed for sleep. 01/24/16   [provider]  ondansetron (ZOFRAN ODT) 4 MG disintegrating tablet Take 1 tablet (4 mg total) by mouth every 8 (eight) hours as needed. 02/07/17   Isla Pence, MD  promethazine (PHENERGAN) 25 MG tablet Take 1 tablet (25 mg total) by mouth every 6 (six) hours as needed for nausea or vomiting. 12/11/16   Earleen Newport, MD  traMADol (ULTRAM) 50 MG tablet Take 1 tablet (50 mg total) by mouth every 6 (six) hours as needed. Patient taking differently: Take 50 mg by mouth every 6 (six) hours as needed (for pain).  12/10/16   Lisa Roca, MD    Allergies Methotrexate derivatives; Morphine and related; Relafen [nabumetone]; Haloperidol; and Morphine  Family History  Problem Relation Age of Onset  . Leukemia Mother   . Arthritis  Mother   . Early death Mother   . Miscarriages / [place] Mother   . Hypothyroidism Sister   . Cancer Paternal Grandmother   . Cancer Paternal Grandfather   . Hypothyroidism Brother     Social History Social History   Tobacco Use  . Smoking status: Never Smoker  . Smokeless tobacco: Never Used  Substance Use Topics  . Alcohol use: No  . Drug use: No    Review of Systems Constitutional: No fever/chills Eyes: No visual changes. ENT: No sore throat. Cardiovascular: Denies chest pain. Respiratory: Denies shortness of breath. Gastrointestinal: No abdominal pain.  No nausea, no vomiting.  No diarrhea.  No constipation. Genitourinary: Negative for dysuria. Musculoskeletal: Negative for neck pain.  Negative for back pain. Integumentary: Negative for rash. Neurological: Negative for headaches, focal weakness or numbness.   ____________________________________________   PHYSICAL EXAM:  VITAL SIGNS: ED Triage Vitals  Enc Vitals Group     BP 03/11/17 2245 (!) 140/91  Pulse Rate 03/11/17 2245 81     Resp 03/11/17 2245 20     Temp 03/11/17 2245 98.5 F (36.9 C)     Temp Source 03/11/17 2245 Oral     SpO2 03/11/17 2245 100 %     Weight 03/11/17 2253 73.9 kg (163 lb)     Height 03/11/17 2253 1.549 m (5\' 1" )     Head Circumference --      Peak Flow --      Pain Score 03/11/17 2252 10     Pain Loc --      Pain Edu? --      Excl. in [place]? --     Constitutional: Alert and oriented. Well appearing and in no acute distress. Eyes: Bilateral conjunctival erythema with injection and exudate noted eyelash matting Head: Atraumatic. Ears:  left TM anterior-inferior perforation with exudate noted in the external auditory canal Nose: No congestion/rhinnorhea. Mouth/Throat: Mucous membranes are moist.  Oropharynx non-erythematous. Neck: No stridor.   Cardiovascular: Normal rate, regular rhythm. Good peripheral circulation. Grossly normal heart sounds. Respiratory: Normal  respiratory effort.  No retractions. Lungs CTAB. Gastrointestinal: Soft and nontender. No distention.  Musculoskeletal: No lower extremity tenderness nor edema. No gross deformities of extremities. Neurologic:  Normal speech and language. No gross focal neurologic deficits are appreciated.  Skin:  Skin is warm, dry and intact. No rash noted. Psychiatric: Mood and affect are normal. Speech and behavior are normal.  ____________________________________________   LABS (all labs ordered are listed, but only abnormal results are displayed)  Labs Reviewed  CBC WITH DIFFERENTIAL/PLATELET - Abnormal; Notable for the following components:      Result Value   RBC 3.23 (*)    Hemoglobin 9.8 (*)    HCT 28.6 (*)    RDW 15.7 (*)    All other components within normal limits  COMPREHENSIVE METABOLIC PANEL - Abnormal; Notable for the following components:   Potassium 2.8 (*)    Glucose, Bld 101 (*)    Creatinine, Ser 1.30 (*)    Calcium 8.4 (*)    GFR calc non Af Amer 46 (*)    GFR calc Af Amer 53 (*)    All other components within normal limits  TROPONIN I   ____________________________________________  EKG  ED ECG REPORT I, [place] N Courtny Bennison, the attending physician, personally viewed and interpreted this ECG.   Date: 03/12/2017  EKG Time: 10:50 PM  Rate: 79  Rhythm: Normal sinus rhythm  Axis: Normal  Intervals: Normal  ST&T Change: None  ____________________________________________  RADIOLOGY I,  N Demaya Hardge, personally viewed and evaluated these images (plain radiographs) as part of my medical decision making, as well as reviewing the written report by the radiologist.  ED MD interpretation: Chest x-ray revealed no acute cardiopulmonary process.  Official radiology report(s): Dg Chest 2 View  Result Date: 03/11/2017 CLINICAL DATA:  Shortness of Breath EXAM: CHEST  2 VIEW COMPARISON:  03/09/2017 FINDINGS: Right Port-A-Cath in place with the tip in the SVC, unchanged. Heart  and mediastinal contours are within normal limits. No focal opacities or effusions. No acute bony abnormality. IMPRESSION: No active cardiopulmonary disease. Electronically Signed   By: Rolm Baptise M.D.   On: 03/11/2017 23:22   Ct Angio Chest Pe W And/or Wo Contrast  Result Date: 03/12/2017 CLINICAL DATA:  Nasal congestion and cough.  Tightness in the chest. EXAM: CT ANGIOGRAPHY CHEST WITH CONTRAST TECHNIQUE: Multidetector CT imaging of the chest was performed using the standard protocol during bolus administration  of intravenous contrast. Multiplanar CT image reconstructions and MIPs were obtained to evaluate the vascular anatomy. CONTRAST:  103mL ISOVUE-370 IOPAMIDOL (ISOVUE-370) INJECTION 76% COMPARISON:  Chest radiograph 03/11/2017 FINDINGS: Cardiovascular: Good opacification of the central and segmental pulmonary arteries. No focal filling defects. No evidence of significant pulmonary embolus. Normal heart size. No pericardial effusion. Dilated ascending thoracic aorta at 3.8 cm. The left subclavian vein appears to be stenosed or strictured with multiple collateral veins throughout the upper chest. A right-sided central venous catheter is present with tip in the low SVC region. The upper SVC also appears somewhat diminutive. Mediastinum/Nodes: No significant lymphadenopathy. Esophagus is decompressed. Lungs/Pleura: Atelectasis in the lung bases. No airspace disease or consolidation. No pneumothorax. No pleural effusions. Airways are patent. Upper Abdomen: Surgical absence of the gallbladder. No acute process suggested in the upper abdomen. Musculoskeletal: No chest wall abnormality. No acute or significant osseous findings. Review of the MIP images confirms the above findings. IMPRESSION: 1. No evidence of significant pulmonary embolus. 2. No evidence of active pulmonary disease. 3. 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 Upper SVC also may be somewhat  diminutive. Electronically Signed   By: Lucienne Capers M.D.   On: 03/12/2017 05:11      Procedures   ____________________________________________   INITIAL IMPRESSION / ASSESSMENT AND PLAN / ED COURSE  As part of my medical decision making, I reviewed the following data within the electronic MEDICAL RECORD [id number] year old female presented with above-stated history and physical exam consistent with bilateral bacterial conjunctivitis left TM perforation with otitis media, sinusitis.  Patient will prescribe Augmentin and clindamycin.  Patient referred to Dr. Kathyrn Sheriff ENT for further outpatient evaluation.  CT scan of the chest was performed secondary to the fact the patient admitted to chest pain and states that her port has not been able to be aspirated or flushed raising concern for possible PE however CT scan of the chest revealed no evidence of pulmonary emboli. ____________________________________________  FINAL CLINICAL IMPRESSION(S) / ED DIAGNOSES  Final diagnoses:  Bacterial conjunctivitis of both eyes  Traumatic tympanic membrane perforation, left, initial encounter  Acute non-recurrent sinusitis, unspecified location     MEDICATIONS GIVEN DURING THIS VISIT:  Medications  ketorolac (TORADOL) 30 MG/ML injection 30 mg ( Intravenous Canceled Entry 03/12/17 0540)  cefTRIAXone (ROCEPHIN) 1 g in dextrose 5 % 50 mL IVPB - Premix (0 g Intravenous Stopped 03/12/17 0351)  alteplase (CATHFLO ACTIVASE) injection 2 mg (2 mg Intracatheter Given 03/12/17 0210)  ondansetron (ZOFRAN) injection 4 mg (4 mg Intravenous Given 03/12/17 0145)  fentaNYL (SUBLIMAZE) injection 50 mcg (50 mcg Intravenous Given 03/12/17 0146)  fentaNYL (SUBLIMAZE) injection 50 mcg (50 mcg Intravenous Given 03/12/17 0336)  iopamidol (ISOVUE-370) 76 % injection 75 mL (75 mLs Intravenous Contrast Given 03/12/17 0441)  erythromycin ophthalmic ointment (1 application Both Eyes Given 03/12/17 0501)  amoxicillin-clavulanate (AUGMENTIN)  875-125 MG per tablet 1 tablet (1 tablet Oral Given 03/12/17 0501)  ciprofloxacin-dexamethasone (CIPRODEX) 0.3-0.1 % OTIC (EAR) suspension 4 drop (4 drops Left EAR Given 03/12/17 0501)  oxyCODONE-acetaminophen (PERCOCET/ROXICET) 5-325 MG per tablet 1 tablet (1 tablet Oral Given 03/12/17 0540)     ED Discharge Orders        Ordered    amoxicillin-clavulanate (AUGMENTIN) 875-125 MG tablet  2 times daily     03/12/17 0533    clindamycin (CLEOCIN) 300 MG capsule  3 times daily     03/12/17 0533       Note:  This document  was prepared using Systems analyst and may include unintentional dictation errors.    Gregor Hams, MD 03/12/17 [phone]

## 2017-03-12 NOTE — ED Notes (Addendum)
Pt crying stating pain is throbbing in left ear. MD consulted. See [facility]

## 2017-03-12 NOTE — ED Notes (Signed)
IV team applied the TPA, will come back on 30 min to see if the port will draw back blood.

## 2017-03-12 NOTE — ED Notes (Signed)
Patient transported to CT 

## 2017-03-12 NOTE — ED Notes (Signed)
IV team member accessed the Pt's implanted port. Port flushes but does not draw back blood. IV team is waiting on TPA to try to unclog the port in order to draw back blood.

## 2017-03-12 NOTE — ED Notes (Signed)
Pt. Verbalizes understanding of d/c instructions, medications, and follow-up. VS stable.  Pt. In NAD at time of d/c and denies further concerns regarding this visit. Pt. Stable at the time of departure from the unit, departing unit by the safest and most appropriate manner per that pt condition and limitations with all belongings accounted for. Pt advised to return to the ED at any time for emergent concerns, or for new/worsening symptoms.   

## 2017-04-06 ENCOUNTER — Emergency Department (HOSPITAL_COMMUNITY)
Admission: EM | Admit: 2017-04-06 | Discharge: 2017-04-06 | Disposition: A | Discharge status: Home / Self Care | Payer: Medicare Other | Attending: Emergency Medicine | Admitting: Emergency Medicine

## 2017-04-06 ENCOUNTER — Encounter (HOSPITAL_COMMUNITY): Payer: Self-pay | Admitting: Emergency Medicine

## 2017-04-06 ENCOUNTER — Emergency Department (HOSPITAL_COMMUNITY): Payer: Medicare Other

## 2017-04-06 ENCOUNTER — Other Ambulatory Visit: Payer: Self-pay

## 2017-04-06 DIAGNOSIS — R109 Unspecified abdominal pain: Secondary | ICD-10-CM

## 2017-04-06 DIAGNOSIS — R1033 Periumbilical pain: Secondary | ICD-10-CM | POA: Insufficient documentation

## 2017-04-06 DIAGNOSIS — K529 Noninfective gastroenteritis and colitis, unspecified: Secondary | ICD-10-CM | POA: Diagnosis not present

## 2017-04-06 DIAGNOSIS — Z79899 Other long term (current) drug therapy: Secondary | ICD-10-CM | POA: Insufficient documentation

## 2017-04-06 DIAGNOSIS — E039 Hypothyroidism, unspecified: Secondary | ICD-10-CM | POA: Diagnosis not present

## 2017-04-06 LAB — URINALYSIS, ROUTINE W REFLEX MICROSCOPIC
BILIRUBIN URINE: NEGATIVE
Glucose, UA: NEGATIVE mg/dL
Hgb urine dipstick: NEGATIVE
KETONES UR: NEGATIVE mg/dL
Nitrite: NEGATIVE
PROTEIN: NEGATIVE mg/dL
SPECIFIC GRAVITY, URINE: 1.019 (ref 1.005–1.030)
pH: 6 (ref 5.0–8.0)

## 2017-04-06 LAB — COMPREHENSIVE METABOLIC PANEL
ALT: 44 U/L (ref 14–54)
AST: 71 U/L — ABNORMAL HIGH (ref 15–41)
Albumin: 4.2 g/dL (ref 3.5–5.0)
Alkaline Phosphatase: 99 U/L (ref 38–126)
Anion gap: 14 (ref 5–15)
BUN: 15 mg/dL (ref 6–20)
CHLORIDE: 96 mmol/L — AB (ref 101–111)
CO2: 23 mmol/L (ref 22–32)
Calcium: 9.5 mg/dL (ref 8.9–10.3)
Creatinine, Ser: 1.24 mg/dL — ABNORMAL HIGH (ref 0.44–1.00)
GFR calc non Af Amer: 49 mL/min — ABNORMAL LOW (ref >60)
GFR, EST AFRICAN AMERICAN: 56 mL/min — AB (ref >60)
Glucose, Bld: 87 mg/dL (ref 65–99)
POTASSIUM: 4.8 mmol/L (ref 3.5–5.1)
SODIUM: 133 mmol/L — AB (ref 135–145)
Total Bilirubin: 1.2 mg/dL (ref 0.3–1.2)
Total Protein: 9.2 g/dL — ABNORMAL HIGH (ref 6.5–8.1)

## 2017-04-06 LAB — CBC
HEMATOCRIT: 38.2 % (ref 36.0–46.0)
Hemoglobin: 12 g/dL (ref 12.0–15.0)
MCH: 28.6 pg (ref 26.0–34.0)
MCHC: 31.4 g/dL (ref 30.0–36.0)
MCV: 91 fL (ref 78.0–100.0)
Platelets: UNDETERMINED 10*3/uL (ref 150–400)
RBC: 4.2 MIL/uL (ref 3.87–5.11)
RDW: 14.9 % (ref 11.5–15.5)
WBC: 4.4 10*3/uL (ref 4.0–10.5)

## 2017-04-06 LAB — LIPASE, BLOOD: LIPASE: 28 U/L (ref 11–51)

## 2017-04-06 MED ORDER — ONDANSETRON HCL 4 MG/2ML IJ SOLN
4.0000 mg | Freq: Once | INTRAMUSCULAR | Status: AC
  Administered 2017-04-06: 4 mg via INTRAVENOUS
  Filled 2017-04-06: qty 2

## 2017-04-06 MED ORDER — HYDROMORPHONE HCL 1 MG/ML IJ SOLN
0.5000 mg | Freq: Once | INTRAMUSCULAR | Status: AC
  Administered 2017-04-06: 0.5 mg via INTRAMUSCULAR
  Filled 2017-04-06: qty 1

## 2017-04-06 MED ORDER — METRONIDAZOLE 500 MG PO TABS: 500.0000 mg | ORAL_TABLET | Freq: Three times a day (TID) | ORAL | 0 refills | Status: DC

## 2017-04-06 MED ORDER — CIPROFLOXACIN HCL 500 MG PO TABS: 500.0000 mg | ORAL_TABLET | Freq: Two times a day (BID) | ORAL | 0 refills | Status: DC

## 2017-04-06 MED ORDER — IOPAMIDOL (ISOVUE-300) INJECTION 61%
INTRAVENOUS | Status: AC
  Administered 2017-04-06: 100 mL
  Filled 2017-04-06: qty 100

## 2017-04-06 MED ORDER — SODIUM CHLORIDE 0.9 % IV BOLUS (SEPSIS)
500.0000 mL | Freq: Once | INTRAVENOUS | Status: AC
  Administered 2017-04-06: 500 mL via INTRAVENOUS

## 2017-04-06 MED ORDER — SODIUM CHLORIDE 0.9% FLUSH: 10.0000 mL | INTRAVENOUS | Status: DC | PRN

## 2017-04-06 MED ORDER — ONDANSETRON 4 MG PO TBDP: 4.0000 mg | ORAL_TABLET | Freq: Once | ORAL | Status: DC

## 2017-04-06 MED ORDER — HEPARIN SOD (PORK) LOCK FLUSH 100 UNIT/ML IV SOLN
500.0000 [IU] | Freq: Once | INTRAVENOUS | Status: AC
  Administered 2017-04-06: 500 [IU]
  Filled 2017-04-06: qty 5

## 2017-04-06 MED ORDER — HYDROCODONE-ACETAMINOPHEN 5-325 MG PO TABS
1.0000 | ORAL_TABLET | Freq: Once | ORAL | Status: AC
  Administered 2017-04-06: 1 via ORAL
  Filled 2017-04-06: qty 1

## 2017-04-06 NOTE — ED Triage Notes (Signed)
C/o constant, sharp, lower abd pain that woke her up at 3am with mild nausea.  Denies vomiting, diarrhea, and urinary complaints.

## 2017-04-06 NOTE — ED Notes (Signed)
Patient transported to CT 

## 2017-04-06 NOTE — ED Notes (Signed)
Pt unable to void at this time, 

## 2017-04-06 NOTE — ED Notes (Signed)
IV therapy at bedside.

## 2017-04-06 NOTE — ED Notes (Addendum)
Pt informed this tech that her pain was returning and requested more pain medication when possible. RN notified.

## 2017-04-06 NOTE — ED Provider Notes (Signed)
[facility] Provider Note   CSN: [id number] Arrival date & time: 04/06/17  0160     History   Chief Complaint Chief Complaint  Patient presents with  . Abdominal Pain    HPI Adrienne Flores is a 53 y.o. female.  HPI   Patient is a 52 year old female with a history of anemia, chronic abdominal pain, cyclic vomiting syndrome, gastroparesis, colitis, rheumatoid arthritis, hysterectomy, cholecystectomy, who presents the ED today complaining of abdominal pain to the periumbilical and lower abdomen that has been ongoing intermittently for the last 10 years, however worsened last night around 230 to 3 AM.  Patient complains of 10 out of 10 pain that is constant and stabbing.  She also reports nausea, but no vomiting.  Also complains of headache to the left frontal aspect of the head, which she states happens every time she has abdominal pain like this.  No vision changes, lightheadedness, dizziness, weakness, numbness.  No diarrhea or constipation.  Last BM yesterday.  No blood in her stool as well as no melena.  No fevers, chest pain, shortness of breath, urinary symptoms, uri sxs.  Has not tried any medications at home to improve her symptoms.  States she has been seen by gastroenterologist in the past, and does have follow-up with GI doctor next month, she is not sure what the name of the doctor is.  Past Medical History:  Diagnosis Date  . Anemia   . At high risk for falls   . Chronic abdominal pain 2008   dates back to at least 2008.  multiple ultrasound, CT scans, x rays at [facility], [place].  [facility] colonoscopy, egd unrevealing.    . Colitis 2015   ? colitis per CTs in 2015, 2016 and 11/2015.  presumed infectious.  colonoscopy in 2015 negative for colitis.   . Constipation   . Cyclic vomiting syndrome 2015   per opinion  of Dr Granville Lewis, IBS specialist at [facility].   . Degenerative disc disease   . Depression   . Fibromyalgia   .  Gastroparesis 03/2011   markedly abnormal gastric emptying study.   . Hematemesis 11/2015   mild to moderate blood in emesis appeared after 2 plus weeks of frequent, non-bloody emesis.   [name] Hemorrhoids   . Hemorrhoids   . History of chicken pox   . Hypokalemia   . Hypothyroid   . Lupus   . 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 Rheumatoid arthritis [facility])     Patient Active Problem List   Diagnosis Date Noted  . Iron deficiency anemia 07/02/2016  . Protein-calorie malnutrition, severe 12/12/2015  . Symptomatic anemia 12/09/2015  . Acute hyponatremia 12/09/2015  . Hematemesis 12/09/2015  . Chronic hoarseness 12/09/2015  . Unintended weight loss 12/09/2015  . Rheu arthrit of right ank/ft w involv of organs and systems ([place]) 06/16/2014  . DDD (degenerative disc disease), cervical 06/16/2014  . 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 DDD (degenerative disc disease), lumbosacral 06/16/2014  . Chronic abdominal and back  pain 07/26/2013  . Nausea and vomiting 07/26/2013  . 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 Rheumatoid arthritis ([place]) 07/26/2013  . UTI (lower urinary tract infection) 07/26/2013  . Uncontrolled pain 07/26/2013  . Acute on chronic renal failure ([place]) 06/27/2013  . Colitis, infectious 06/25/2013  . AKI (acute kidney injury) ([place]) 06/25/2013  . Transaminitis 06/25/2013  . Infectious colitis 06/25/2013  . Abdominal pain 11/26/2012  . Hypothyroidism 11/26/2012    Past  Surgical History:  Procedure Laterality Date  . ABDOMINAL HYSTERECTOMY     total  . CESAREAN SECTION     x2  . CHOLECYSTECTOMY    . COLONOSCOPY  11/2013   at [facility] Dr Kennith Gain. Hemorrhoids. sigmoid tics. random biopsies negative for microscopic colitis or any pathology.   . ESOPHAGOGASTRODUODENOSCOPY  11/2013   Dr Kennith Gain at [facility].  Normal study.  Duodenal bx negative for villous atrophy.   . ESOPHAGOGASTRODUODENOSCOPY N/A 12/09/2015   Procedure: ESOPHAGOGASTRODUODENOSCOPY (EGD);  Surgeon: Doran Stabler, MD;   Location: [name] ENDOSCOPY;  Service: Endoscopy;  Laterality: N/A;  . KNEE SURGERY Left   . OOPHORECTOMY    . REPLACEMENT TOTAL KNEE Bilateral     OB History    No data available       Home Medications    Prior to Admission medications   Medication Sig Start Date End Date Taking? Authorizing Provider  aspirin-acetaminophen-caffeine (EXCEDRIN MIGRAINE) [phone] MG tablet Take 1 tablet by mouth every 8 (eight) hours as needed for headache or migraine.     [provider]  ciprofloxacin (CIPRO) 500 MG tablet Take 1 tablet (500 mg total) by mouth every 12 (twelve) hours. 04/06/17   Martasia Talamante S, PA-C  dicyclomine (BENTYL) 20 MG tablet Take 20 mg by mouth daily as needed for spasms.  05/08/16   [provider]  diphenoxylate-atropine (LOMOTIL) 2.5-0.025 MG tablet Take 1 tablet by mouth 4 (four) times daily as needed for diarrhea or loose stools. 12/11/16 12/11/17  Earleen Newport, MD  gabapentin (NEURONTIN) 600 MG tablet Take 600 mg by mouth 2 (two) times daily. 11/15/15   [provider]  HYDROcodone-acetaminophen (NORCO) 5-325 MG tablet Take 1 tablet by mouth every 6 (six) hours as needed for up to 7 doses for severe pain. 03/09/17   Darel Hong, MD  HYDROcodone-acetaminophen (NORCO/VICODIN) 5-325 MG tablet Take 1 tablet by mouth every 4 (four) hours as needed. 02/07/17   Isla Pence, MD  levothyroxine (SYNTHROID, LEVOTHROID) 100 MCG tablet Take 350 mcg by mouth daily before breakfast.     [provider]  Melatonin 3 MG TABS Take 3 mg by mouth at bedtime as needed for sleep. 01/24/16   [provider]  metroNIDAZOLE (FLAGYL) 500 MG tablet Take 1 tablet (500 mg total) by mouth 3 (three) times daily. 04/06/17   Tina Gruner S, PA-C  ondansetron (ZOFRAN ODT) 4 MG disintegrating tablet Take 1 tablet (4 mg total) by mouth every 8 (eight) hours as needed. 02/07/17   Isla Pence, MD  promethazine (PHENERGAN) 25 MG tablet Take 1 tablet  (25 mg total) by mouth every 6 (six) hours as needed for nausea or vomiting. 12/11/16   Earleen Newport, MD  traMADol (ULTRAM) 50 MG tablet Take 1 tablet (50 mg total) by mouth every 6 (six) hours as needed. Patient taking differently: Take 50 mg by mouth every 6 (six) hours as needed (for pain).  12/10/16   Lisa Roca, MD    Family History Family History  Problem Relation Age of Onset  . Leukemia Mother   . Arthritis Mother   . Early death Mother   . Miscarriages / [place] Mother   . Hypothyroidism Sister   . Cancer Paternal Grandmother   . Cancer Paternal Grandfather   . Hypothyroidism Brother     Social History Social History   Tobacco Use  . Smoking status: Never Smoker  . Smokeless tobacco: Never Used  Substance Use Topics  . Alcohol use:  No  . Drug use: No     Allergies   Methotrexate derivatives; Morphine and related; Relafen [nabumetone]; Haloperidol; and Morphine   Review of Systems Review of Systems  Constitutional: Positive for chills. Negative for fever.  HENT: Negative for congestion, rhinorrhea and sore throat.   Eyes: Negative for visual disturbance.  Respiratory: Negative for cough and shortness of breath.   Cardiovascular: Negative for chest pain.  Gastrointestinal: Positive for abdominal pain and nausea. Negative for blood in stool, constipation, diarrhea and vomiting.  Genitourinary: Negative for dysuria, hematuria and urgency.  Musculoskeletal: Negative for back pain.  Skin: Negative for rash.  Neurological: Positive for headaches. Negative for dizziness, weakness, light-headedness and numbness.     Physical Exam Updated Vital Signs BP 113/86 (BP Location: Right Arm)   Pulse 65   Temp 98 F (36.7 C) (Oral)   Resp 16   Ht 5\' 1"  (1.549 m)   Wt 72.6 kg (160 lb)   SpO2 100%   BMI 30.23 kg/m   Physical Exam  Constitutional: She appears well-developed and well-nourished. She does not appear ill. No distress.  HENT:  Head:  Normocephalic and atraumatic.  Eyes: Conjunctivae and EOM are normal. Pupils are equal, round, and reactive to light.  Neck: Neck supple.  No nuchal rigidity  Cardiovascular: Normal rate, regular rhythm, normal heart sounds and intact distal pulses.  No murmur heard. Pulmonary/Chest: Effort normal and breath sounds normal. No respiratory distress. She has no wheezes. She has no rhonchi. She has no rales.  Abdominal: Soft. She exhibits no distension. Bowel sounds are decreased. There is tenderness in the periumbilical area and suprapubic area. There is guarding. There is no rigidity and no rebound.  Musculoskeletal: She exhibits no edema.  Neurological: She is alert.  Cranial nerves 2-12 intact. 5/5 strength to bue and ble. Normal sensation throughout.  Skin: Skin is warm and dry.  Psychiatric:  Tearful on exam  Nursing note and vitals reviewed.    ED Treatments / Results  Labs (all labs ordered are listed, but only abnormal results are displayed) Labs Reviewed  COMPREHENSIVE METABOLIC PANEL - Abnormal; Notable for the following components:      Result Value   Sodium 133 (*)    Chloride 96 (*)    Creatinine, Ser 1.24 (*)    Total Protein 9.2 (*)    AST 71 (*)    GFR calc non Af Amer 49 (*)    GFR calc Af Amer 56 (*)    All other components within normal limits  URINALYSIS, ROUTINE W REFLEX MICROSCOPIC - Abnormal; Notable for the following components:   APPearance HAZY (*)    Leukocytes, UA TRACE (*)    Bacteria, UA RARE (*)    Squamous Epithelial / LPF 6-30 (*)    All other components within normal limits  URINE CULTURE  LIPASE, BLOOD  CBC    EKG  EKG Interpretation None       Radiology Ct Abdomen Pelvis W Contrast  Result Date: 04/06/2017 CLINICAL DATA:  Months of abdominal pain. EXAM: CT ABDOMEN AND PELVIS WITH CONTRAST TECHNIQUE: Multidetector CT imaging of the abdomen and pelvis was performed using the standard protocol following bolus administration of  intravenous contrast. CONTRAST:  185mL ISOVUE-300 IOPAMIDOL (ISOVUE-300) INJECTION 61% COMPARISON:  February 07, 2017 FINDINGS: Lower chest: No acute abnormality. Hepatobiliary: Hepatic steatosis. Previous cholecystectomy. The portal vein is patent. No other abnormalities in the liver. Pancreas: Unremarkable. No pancreatic ductal dilatation or surrounding inflammatory changes. Spleen: Normal  in size without focal abnormality. Adrenals/Urinary Tract: The adrenal glands, kidneys, ureters, and bladder are normal. Stomach/Bowel: The stomach is normal. Most of the small bowel is normal. There may be mild thickening in the distal ileum although this is unclear. Moderate fecal loading in the colon, particularly the transverse colon. There is wall thickening associated with a portion of the ascending colon and the proximal transverse colon. The appendix is normal in caliber. There is a small amount of increased attenuation in the fat near the distal appendix but this is thought to be tracking down the pericolic gutter from the thick walled colon. Vascular/Lymphatic: Atherosclerotic changes seen in the nonaneurysmal aorta. No adenopathy. Reproductive: Status post hysterectomy. No adnexal masses. Other: No abdominal wall hernia or abnormality. No abdominopelvic ascites. Musculoskeletal: No acute or significant osseous findings. IMPRESSION: 1. There is wall thickening associated with a portion of ascending colon and the proximal transverse colon. There may be minimal thickening associated with the distal ileum as well best seen on coronal images. The findings suggest colitis. Given the chronicity of several months, inflammatory bowel disease should be considered. Infectious causes are possible. Ischemic causes are less likely given chronicity. 2. The appendix is normal in caliber. The small amount of increased attenuation in the fat near the distal appendix appears to be tracking inferiorly along the pericolic gutter, likely  arising from the colon. There is no dilatation or wall thickening associated with the appendix to suggest appendicitis. Recommend clinical correlation. 3. Atherosclerotic change in the abdominal aorta. Electronically Signed   By: Dorise Bullion III M.D   On: 04/06/2017 12:00    Procedures Procedures (including critical care time)  Medications Ordered in ED Medications  HYDROmorphone (DILAUDID) injection 0.5 mg (0.5 mg Intramuscular Given 04/06/17 1110)  sodium chloride 0.9 % bolus 500 mL (0 mLs Intravenous Stopped 04/06/17 1324)  ondansetron (ZOFRAN) injection 4 mg (4 mg Intravenous Given 04/06/17 1111)  iopamidol (ISOVUE-300) 61 % injection (100 mLs  Contrast Given 04/06/17 1120)  heparin lock flush 100 unit/mL (500 Units Intracatheter Given 04/06/17 1426)  HYDROcodone-acetaminophen (NORCO/VICODIN) 5-325 MG per tablet 1 tablet (1 tablet Oral Given 04/06/17 1338)     Initial Impression / Assessment and Plan / ED Course  I have reviewed the triage vital signs and the nursing notes.  Pertinent labs & imaging results that were available during my care of the patient were reviewed by me and considered in my medical decision making (see chart for details).    Discussed pt presentation and exam findings with Dr. Roderic Palau, who agrees with the plan to d/c with abx and have GI f/u.    Final Clinical Impressions(s) / ED Diagnoses   Final diagnoses:  Colitis  Abdominal pain, unspecified abdominal location    52 year old woman with chronic abdominal pain, presenting with worsening abdominal pain today.  She is nontoxic and nonseptic appearing.  Her vital signs are stable, with the exception of mild hypertension likely due to pain.  Patient tearful on exam.  Abdominal exam with tenderness to periumbilical and suprapubic areas, with guarding.  Will give Zofran, pain medication, fluid bolus, and will order CT scan.  Labs grossly reassuring and at baseline for patient UA likely contaminated, will send  culture.  Patient has no urinary symptoms to suggest UTI.  Repeat abdominal exam with mild tenderness to the suprapubic and periumbilical areas, without guarding. patient does not have a surgical abdomin and there are no peritoneal signs. Patient's pain and other symptoms adequately managed in emergency department.  Fluid bolus given.  Labs, imaging and vitals reviewed. Labs at baseline for pt. Patient does not meet the SIRS or Sepsis criteria. CT abd/pelvis with IV contrast showed bowel wall thickening suggestive of colitis, less likely ischemic given chronicity. Could be reltaed to inflammatory bowel disease. No evidence of appendicitis, bowel obstruction, bowel perforation, cholecystitis, diverticulitis, PID or ectopic pregnancy.  Patient discharged home with antibiotic treatment and advised to take bentyl that she has at home. Advised her to f/u with gi as already scheduled and she was given instructions for follow-up with their primary care physician as well.  I have also discussed reasons to return immediately to the ER.  Patient and her husband express understanding and agrees with plan.  ED Discharge Orders        Ordered    ciprofloxacin (CIPRO) 500 MG tablet  Every 12 hours     04/06/17 1310    metroNIDAZOLE (FLAGYL) 500 MG tablet  3 times daily     04/06/17 [address] [place], PA-C 04/06/17 1842    Milton Ferguson, MD 04/07/17 [id number]

## 2017-04-06 NOTE — Discharge Instructions (Addendum)
You were  seen in the emergency department today for abdominal pain. Your CT scan showed that you have colitis.  You were given 2 antibiotics to treat this.  Please take these antibiotics fully.  Avoid drinking any alcohol while taking Flagyl.  Please follow up with your GI doctor as scheduled next week. Please take the antibiotics as directed.  Follow-up with your primary care doctor as well within the next week for recheck.   Please return to the ER sooner if you have any new or worsening symptoms, or if you have any of the following symptoms:  Abdominal pain that does not go away.  You have a fever.  You keep throwing up (vomiting).  The pain is felt only in portions of the abdomen. Pain in the right side could possibly be appendicitis. In an adult, pain in the left lower portion of the abdomen could be colitis or diverticulitis.  You pass bloody or black tarry stools.  There is bright red blood in the stool.  The constipation stays for more than 4 days.  There is belly (abdominal) or rectal pain.  You do not seem to be getting better.  You have any questions or concerns.

## 2017-04-07 LAB — URINE CULTURE: Culture: <10000 — AB

## 2017-04-16 ENCOUNTER — Other Ambulatory Visit (INDEPENDENT_AMBULATORY_CARE_PROVIDER_SITE_OTHER): Payer: Self-pay | Admitting: Vascular Surgery

## 2017-04-16 ENCOUNTER — Other Ambulatory Visit: Payer: Self-pay

## 2017-04-17 ENCOUNTER — Encounter: Payer: Self-pay | Admitting: Gastroenterology

## 2017-04-17 ENCOUNTER — Ambulatory Visit (INDEPENDENT_AMBULATORY_CARE_PROVIDER_SITE_OTHER): Payer: Medicare Other | Admitting: Gastroenterology

## 2017-04-17 ENCOUNTER — Telehealth: Payer: Self-pay | Admitting: Gastroenterology

## 2017-04-17 VITALS — BP 145/95 | HR 75 | Ht 61.0 in | Wt 164.0 lb

## 2017-04-17 DIAGNOSIS — R1084 Generalized abdominal pain: Secondary | ICD-10-CM | POA: Diagnosis not present

## 2017-04-17 DIAGNOSIS — R634 Abnormal weight loss: Secondary | ICD-10-CM

## 2017-04-17 DIAGNOSIS — R933 Abnormal findings on diagnostic imaging of other parts of digestive tract: Secondary | ICD-10-CM

## 2017-04-17 MED ORDER — CEFAZOLIN SODIUM-DEXTROSE 2-4 GM/100ML-% IV SOLN
2.0000 g | Freq: Once | INTRAVENOUS | Status: AC
  Administered 2017-04-18: 2 g via INTRAVENOUS

## 2017-04-17 NOTE — Telephone Encounter (Signed)
Tramadol has been called to pharmacy with a quantity of 90 no refills per Dr. Lynnell Jude request during patients office visit.

## 2017-04-17 NOTE — Telephone Encounter (Signed)
Pt husbad called he states we just left the office and we are at the pharmacy and the rx is not called in

## 2017-04-17 NOTE — Progress Notes (Signed)
Gastroenterology Consultation  Referring Provider:     [name], MD Primary Care Physician:  [name], MD Primary Gastroenterologist:  Dr. Allen Norris     Reason for Consultation:     Abdominal pain        HPI:   Adrienne Flores is a 52 y.o. y/o female referred for consultation & management of Donald pain by Dr. [name], MD.  This patient comes in today after seeing multiple gastrologist in the past including Dr. Vira Agar, Dr. Marton Redwood nurse practitioner and a gastrologist at [facility].  The patient was started on dicyclomine and states that it is not helping her.  She also reports that she has lost approximately 100 pounds without trying in the last few years.  The patient comes with her husband today who reports that her pain is in the lower abdomen.  She states that when she takes tramadol her pain gets better and she is able to eat.  Otherwise the patient has chronic nausea.  The patient denies any black stools or bloody stools.  She also was in the ER at the beginning of this year and had a CT scan that showed her to have inflammation in the proximal transverse and ascending colon.  The patient also has a history of rheumatoid arthritis and chronic pain issues.  The patient states that she is set up to see pain management next month.  Past Medical History:  Diagnosis Date  . Anemia   . At high risk for falls   . Chronic abdominal pain 2008   dates back to at least 2008.  multiple ultrasound, CT scans, x rays at [facility], [place].  [facility] colonoscopy, egd unrevealing.    . Colitis 2015   ? colitis per CTs in 2015, 2016 and 11/2015.  presumed infectious.  colonoscopy in 2015 negative for colitis.   . Constipation   . Cyclic vomiting syndrome 2015   per opinion  of Dr Granville Lewis, IBS specialist at [facility].   . Degenerative disc disease   . Depression   . Fibromyalgia   . Gastroparesis 03/2011   markedly abnormal gastric emptying study.   . Hematemesis 11/2015   mild to  moderate blood in emesis appeared after 2 plus weeks of frequent, non-bloody emesis.   [name] Hemorrhoids   . Hemorrhoids   . History of chicken pox   . Hypokalemia   . Hypothyroid   . Lupus   . 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 Rheumatoid arthritis [facility])     Past Surgical History:  Procedure Laterality Date  . ABDOMINAL HYSTERECTOMY     total  . CESAREAN SECTION     x2  . CHOLECYSTECTOMY    . COLONOSCOPY  11/2013   at [facility] Dr Kennith Gain. Hemorrhoids. sigmoid tics. random biopsies negative for microscopic colitis or any pathology.   . ESOPHAGOGASTRODUODENOSCOPY  11/2013   Dr Kennith Gain at [facility].  Normal study.  Duodenal bx negative for villous atrophy.   . ESOPHAGOGASTRODUODENOSCOPY N/A 12/09/2015   Procedure: ESOPHAGOGASTRODUODENOSCOPY (EGD);  Surgeon: Doran Stabler, MD;  Location: [facility] ENDOSCOPY;  Service: Endoscopy;  Laterality: N/A;  . KNEE SURGERY Left   . OOPHORECTOMY    . REPLACEMENT TOTAL KNEE Bilateral     Prior to Admission medications   Medication Sig Start Date End Date Taking? Authorizing Provider  aspirin-acetaminophen-caffeine (EXCEDRIN MIGRAINE) [phone] MG tablet Take 1 tablet by mouth every 8 (eight) hours as needed for headache or migraine.    Yes [provider]  dicyclomine (BENTYL) 20 MG tablet Take 20 mg by mouth daily as needed for spasms.  05/08/16  Yes [provider]  levothyroxine (SYNTHROID, LEVOTHROID) 137 MCG tablet Take 137 mcg by mouth daily before breakfast.    Yes [provider]  tizanidine (ZANAFLEX) 2 MG capsule TK 1 C PO TID PRF MUSCLE SPASMS 03/30/17  Yes [provider]  traMADol (ULTRAM) 50 MG tablet Take 1 tablet (50 mg total) by mouth every 6 (six) hours as needed. Patient taking differently: Take 50 mg by mouth every 6 (six) hours as needed (for pain).  12/10/16  Yes Lisa Roca, MD  diphenoxylate-atropine (LOMOTIL) 2.5-0.025 MG tablet Take 1 tablet by mouth 4 (four) times daily as needed for diarrhea or loose  stools. Patient not taking: Reported on 04/16/2017 12/11/16 12/11/17  Earleen Newport, MD  HYDROcodone-acetaminophen [facility]) 5-325 MG tablet Take 1 tablet by mouth every 6 (six) hours as needed for up to 7 doses for severe pain. Patient not taking: Reported on 04/16/2017 03/09/17   Darel Hong, MD  ibuprofen (ADVIL,MOTRIN) 200 MG tablet Take 200 mg by mouth every 6 (six) hours as needed for headache or moderate pain.  03/07/17   [provider]  ondansetron (ZOFRAN ODT) 4 MG disintegrating tablet Take 1 tablet (4 mg total) by mouth every 8 (eight) hours as needed. Patient not taking: Reported on 04/16/2017 02/07/17   Isla Pence, MD    Family History  Problem Relation Age of Onset  . Leukemia Mother   . Arthritis Mother   . Early death Mother   . Miscarriages / [place] Mother   . Hypothyroidism Sister   . Cancer Paternal Grandmother   . Cancer Paternal Grandfather   . Hypothyroidism Brother      Social History   Tobacco Use  . Smoking status: Never Smoker  . Smokeless tobacco: Never Used  Substance Use Topics  . Alcohol use: No  . Drug use: No    Allergies as of 04/17/2017 - Review Complete 04/17/2017  Allergen Reaction Noted  . Morphine and related Hives and Itching 12/02/2012  . Methotrexate derivatives Other (See Comments) 11/10/2012  . Relafen [nabumetone] Other (See Comments) 01/05/2013  . Haloperidol Nausea Only and Other (See Comments) 01/12/2017  . Morphine Hives 12/10/2012    Review of Systems:    All systems reviewed and negative except where noted in HPI.   Physical Exam:  BP (!) 145/95   Pulse 75   Ht 5\' 1"  (1.549 m)   Wt 164 lb (74.4 kg)   BMI 30.99 kg/m  No LMP recorded. Patient has had a hysterectomy. Psych:  Alert and cooperative. Normal mood and affect. General:   Alert,  Well-developed, well-nourished, pleasant and cooperative in NAD Head:  Normocephalic and atraumatic. Eyes:  Sclera clear, no icterus.   Conjunctiva  pink. Ears:  Normal auditory acuity. Nose:  No deformity, discharge, or lesions. Mouth:  No deformity or lesions,oropharynx pink & moist. Neck:  Supple; no masses or thyromegaly. Lungs:  Respirations even and unlabored.  Clear throughout to auscultation.   No wheezes, crackles, or rhonchi. No acute distress. Heart:  Regular rate and rhythm; no murmurs, clicks, rubs, or gallops. Abdomen:  Normal bowel sounds.  No bruits.  Soft, Diffusely mildly tender and non-distended without masses, hepatosplenomegaly or hernias noted.  No guarding or rebound tenderness.  Negative Carnett sign.   Rectal:  Deferred.  Msk:  Symmetrical without gross deformities.  Good, equal movement & strength bilaterally.  Pulses:  Normal pulses noted. Extremities:  No clubbing or edema.  No cyanosis. Neurologic:  Alert and oriented x3;  grossly normal neurologically. Skin:  The patient has multiple lesions on both arms and states that it is from separating her cat and dog from fighting.  No jaundice. Lymph Nodes:  No significant cervical adenopathy. Psych:  Alert and cooperative. Normal mood and affect.  Imaging Studies: Ct Abdomen Pelvis W Contrast  Result Date: 04/06/2017 CLINICAL DATA:  Months of abdominal pain. EXAM: CT ABDOMEN AND PELVIS WITH CONTRAST TECHNIQUE: Multidetector CT imaging of the abdomen and pelvis was performed using the standard protocol following bolus administration of intravenous contrast. CONTRAST:  149mL ISOVUE-300 IOPAMIDOL (ISOVUE-300) INJECTION 61% COMPARISON:  February 07, 2017 FINDINGS: Lower chest: No acute abnormality. Hepatobiliary: Hepatic steatosis. Previous cholecystectomy. The portal vein is patent. No other abnormalities in the liver. Pancreas: Unremarkable. No pancreatic ductal dilatation or surrounding inflammatory changes. Spleen: Normal in size without focal abnormality. Adrenals/Urinary Tract: The adrenal glands, kidneys, ureters, and bladder are normal. Stomach/Bowel: The stomach is  normal. Most of the small bowel is normal. There may be mild thickening in the distal ileum although this is unclear. Moderate fecal loading in the colon, particularly the transverse colon. There is wall thickening associated with a portion of the ascending colon and the proximal transverse colon. The appendix is normal in caliber. There is a small amount of increased attenuation in the fat near the distal appendix but this is thought to be tracking down the pericolic gutter from the thick walled colon. Vascular/Lymphatic: Atherosclerotic changes seen in the nonaneurysmal aorta. No adenopathy. Reproductive: Status post hysterectomy. No adnexal masses. Other: No abdominal wall hernia or abnormality. No abdominopelvic ascites. Musculoskeletal: No acute or significant osseous findings. IMPRESSION: 1. There is wall thickening associated with a portion of ascending colon and the proximal transverse colon. There may be minimal thickening associated with the distal ileum as well best seen on coronal images. The findings suggest colitis. Given the chronicity of several months, inflammatory bowel disease should be considered. Infectious causes are possible. Ischemic causes are less likely given chronicity. 2. The appendix is normal in caliber. The small amount of increased attenuation in the fat near the distal appendix appears to be tracking inferiorly along the pericolic gutter, likely arising from the colon. There is no dilatation or wall thickening associated with the appendix to suggest appendicitis. Recommend clinical correlation. 3. Atherosclerotic change in the abdominal aorta. Electronically Signed   By: Dorise Bullion III M.D   On: 04/06/2017 12:00    Assessment and Plan:   Adrienne Flores is a 52 y.o. y/o female Who comes in today with chronic abdominal pain who has been evaluated by multiple gastrologist in the past.  The patient's symptoms are consistent with irritable bowel syndrome versus ischemic  bowel/abdominal angina or possibly inflammatory bowel disease.  The patient will be set up for an EGD and colonoscopy.  The patient will have her tramadol refilled until she is able to see pain management.I have discussed risks & benefits which include, but are not limited to, bleeding, infection, perforation & drug reaction.  The patient agrees with this plan & written consent will be obtained.     Lucilla Lame, MD. Marval Regal   Note: This dictation was prepared with Dragon dictation along with smaller phrase technology. Any transcriptional errors that result from this process are unintentional.

## 2017-04-18 ENCOUNTER — Encounter
Admission: RE | Disposition: A | Discharge status: Home / Self Care | Payer: Self-pay | Source: Ambulatory Visit | Attending: Vascular Surgery

## 2017-04-18 ENCOUNTER — Encounter: Payer: Self-pay | Admitting: *Deleted

## 2017-04-18 ENCOUNTER — Ambulatory Visit
Admission: RE | Admit: 2017-04-18 | Discharge: 2017-04-18 | Disposition: A | Discharge status: Home / Self Care | Payer: Medicare Other | Source: Ambulatory Visit | Attending: Vascular Surgery | Admitting: Vascular Surgery

## 2017-04-18 DIAGNOSIS — M797 Fibromyalgia: Secondary | ICD-10-CM | POA: Insufficient documentation

## 2017-04-18 DIAGNOSIS — I878 Other specified disorders of veins: Secondary | ICD-10-CM

## 2017-04-18 DIAGNOSIS — F329 Major depressive disorder, single episode, unspecified: Secondary | ICD-10-CM | POA: Insufficient documentation

## 2017-04-18 DIAGNOSIS — M329 Systemic lupus erythematosus, unspecified: Secondary | ICD-10-CM | POA: Insufficient documentation

## 2017-04-18 DIAGNOSIS — Z885 Allergy status to narcotic agent status: Secondary | ICD-10-CM | POA: Insufficient documentation

## 2017-04-18 DIAGNOSIS — E039 Hypothyroidism, unspecified: Secondary | ICD-10-CM | POA: Insufficient documentation

## 2017-04-18 DIAGNOSIS — T82868A Thrombosis of vascular prosthetic devices, implants and grafts, initial encounter: Secondary | ICD-10-CM

## 2017-04-18 DIAGNOSIS — M069 Rheumatoid arthritis, unspecified: Secondary | ICD-10-CM | POA: Diagnosis not present

## 2017-04-18 DIAGNOSIS — T82514A Breakdown (mechanical) of infusion catheter, initial encounter: Secondary | ICD-10-CM | POA: Diagnosis not present

## 2017-04-18 DIAGNOSIS — G8929 Other chronic pain: Secondary | ICD-10-CM | POA: Diagnosis not present

## 2017-04-18 DIAGNOSIS — K3184 Gastroparesis: Secondary | ICD-10-CM | POA: Insufficient documentation

## 2017-04-18 DIAGNOSIS — Y713 Surgical instruments, materials and cardiovascular devices (including sutures) associated with adverse incidents: Secondary | ICD-10-CM | POA: Insufficient documentation

## 2017-04-18 DIAGNOSIS — I871 Compression of vein: Secondary | ICD-10-CM | POA: Insufficient documentation

## 2017-04-18 DIAGNOSIS — R109 Unspecified abdominal pain: Secondary | ICD-10-CM | POA: Diagnosis not present

## 2017-04-18 HISTORY — PX: PORTA CATH INSERTION: CATH118285

## 2017-04-18 SURGERY — PORTA CATH INSERTION
Anesthesia: Moderate Sedation

## 2017-04-18 MED ORDER — MIDAZOLAM HCL 2 MG/ML PO SYRP
8.0000 mg | ORAL_SOLUTION | Freq: Once | ORAL | Status: AC
  Administered 2017-04-18: 8 mg via ORAL

## 2017-04-18 MED ORDER — SODIUM CHLORIDE 0.9 % IV SOLN
INTRAVENOUS | Status: DC
  Administered 2017-04-18: 13:00:00 via INTRAVENOUS

## 2017-04-18 MED ORDER — DIPHENHYDRAMINE HCL 50 MG/ML IJ SOLN
INTRAMUSCULAR | Status: AC
  Filled 2017-04-18: qty 1

## 2017-04-18 MED ORDER — LIDOCAINE-EPINEPHRINE (PF) 1 %-1:200000 IJ SOLN
INTRAMUSCULAR | Status: AC
  Filled 2017-04-18: qty 30

## 2017-04-18 MED ORDER — SODIUM CHLORIDE 0.9 % IV SOLN
INTRAVENOUS | Status: AC | PRN
  Administered 2017-04-18: 250 mL via INTRAVENOUS

## 2017-04-18 MED ORDER — HYDROMORPHONE HCL 1 MG/ML IJ SOLN: 1.0000 mg | Freq: Once | INTRAMUSCULAR | Status: DC | PRN

## 2017-04-18 MED ORDER — FENTANYL CITRATE (PF) 100 MCG/2ML IJ SOLN
INTRAMUSCULAR | Status: AC
  Filled 2017-04-18: qty 2

## 2017-04-18 MED ORDER — HEPARIN (PORCINE) IN NACL 2-0.9 UNIT/ML-% IJ SOLN
INTRAMUSCULAR | Status: AC
  Filled 2017-04-18: qty 500

## 2017-04-18 MED ORDER — ONDANSETRON HCL 4 MG/2ML IJ SOLN: 4.0000 mg | Freq: Four times a day (QID) | INTRAMUSCULAR | Status: DC | PRN

## 2017-04-18 MED ORDER — IOPAMIDOL (ISOVUE-300) INJECTION 61%
INTRAVENOUS | Status: DC | PRN
  Administered 2017-04-18: 30 mL via INTRA_ARTERIAL

## 2017-04-18 MED ORDER — FENTANYL CITRATE (PF) 100 MCG/2ML IJ SOLN
INTRAMUSCULAR | Status: DC | PRN
  Administered 2017-04-18: 25 ug via INTRAVENOUS
  Administered 2017-04-18 (×3): 50 ug via INTRAVENOUS

## 2017-04-18 MED ORDER — MIDAZOLAM HCL 2 MG/2ML IJ SOLN
INTRAMUSCULAR | Status: DC | PRN
  Administered 2017-04-18 (×2): 2 mg via INTRAVENOUS
  Administered 2017-04-18: 1 mg via INTRAVENOUS
  Administered 2017-04-18: 0.5 mg via INTRAVENOUS

## 2017-04-18 MED ORDER — MIDAZOLAM HCL 2 MG/ML PO SYRP
ORAL_SOLUTION | ORAL | Status: AC
  Filled 2017-04-18: qty 4

## 2017-04-18 MED ORDER — MIDAZOLAM HCL 5 MG/5ML IJ SOLN
INTRAMUSCULAR | Status: AC
  Filled 2017-04-18: qty 5

## 2017-04-18 MED ORDER — MIDAZOLAM HCL 2 MG/2ML IJ SOLN
INTRAMUSCULAR | Status: AC
  Filled 2017-04-18: qty 2

## 2017-04-18 MED ORDER — DIPHENHYDRAMINE HCL 50 MG/ML IJ SOLN
INTRAMUSCULAR | Status: DC | PRN
  Administered 2017-04-18: 25 mg via INTRAVENOUS

## 2017-04-18 SURGICAL SUPPLY — 15 items
CATH BEACON 5 .035 40 KMP TP (CATHETERS) IMPLANT
CATH BEACON 5 .038 40 KMP TP (CATHETERS) ×2
DEVICE TORQUE (MISCELLANEOUS) ×2 IMPLANT
DRAPE INCISE IOBAN 66X45 STRL (DRAPES) ×5 IMPLANT
GUIDEWIRE ANGLED .035 180CM (WIRE) ×2 IMPLANT
NDL ENTRY 21GA 7CM ECHOTIP (NEEDLE) IMPLANT
NEEDLE ENTRY 21GA 7CM ECHOTIP (NEEDLE) ×3 IMPLANT
PACK ANGIOGRAPHY (CUSTOM PROCEDURE TRAY) ×3 IMPLANT
SET INTRO CAPELLA COAXIAL (SET/KITS/TRAYS/PACK) ×2 IMPLANT
SPONGE XRAY 4X4 16PLY STRL (MISCELLANEOUS) ×2 IMPLANT
SUT MNCRL AB 4-0 PS2 18 (SUTURE) ×5 IMPLANT
SUT PROLENE 0 CT 1 30 (SUTURE) ×3 IMPLANT
SUT VIC AB 3-0 SH 27 (SUTURE) ×3
SUT VIC AB 3-0 SH 27X BRD (SUTURE) IMPLANT
SUTURE VIC 3-0 (SUTURE) ×3 IMPLANT

## 2017-04-18 NOTE — H&P (Signed)
[facility] Admission History & Physical  MRN : [id number]  Adrienne Flores is a 52 y.o. (1965-09-06) female who presents with chief complaint of No chief complaint on file. [name]  History of Present Illness: I am asked to evaluate the patient for  malfunction of her right sided Port-A-Cath.  Patient notes that she has had a Port-A-Cath for approximately 10 years.  Recently they have been completely unable to aspirate blood for any of her laboratory tests.  On questioning there have been multiple occasions over the past 2 years in particular where her port is been treated for the same problem she is undergone multiple treatments with TPA.  The most recent one is been unsuccessful.  She states she has never undergone a stripping.  Patient denies tenderness at the port site.  There is no history of fever chills or swelling.  Patient has a long-standing history of chronic abdominal pain of uncertain etiology.  This is the reason that she requires frequent intravenous access as well as frequent lab draws.  As these 2 tasks became more and more difficult she was transitioned to a Port-A-Cath.  Current Facility-Administered Medications  Medication Dose Route Frequency Provider Last Rate Last Dose  . 0.9 %  sodium chloride infusion   Intravenous Continuous Stegmayer, Kimberly A, PA-C 75 mL/hr at 04/18/17 1309    . ceFAZolin (ANCEF) IVPB 2g/100 mL premix  2 g Intravenous Once Stegmayer, Kimberly A, PA-C      . HYDROmorphone (DILAUDID) injection 1 mg  1 mg Intravenous Once PRN Stegmayer, Kimberly A, PA-C      . midazolam (VERSED) 2 MG/ML syrup           . ondansetron (ZOFRAN) injection 4 mg  4 mg Intravenous Q6H PRN Stegmayer, [name], PA-C        Past Medical History:  Diagnosis Date  . Anemia   . At high risk for falls   . Chronic abdominal pain 2008   dates back to at least 2008.  multiple ultrasound, CT scans, x rays at [facility], [place].  [facility] colonoscopy, egd unrevealing.     . Colitis 2015   ? colitis per CTs in 2015, 2016 and 11/2015.  presumed infectious.  colonoscopy in 2015 negative for colitis.   . Constipation   . Cyclic vomiting syndrome 2015   per opinion  of Dr Granville Lewis, IBS specialist at [facility]   . Degenerative disc disease   . Depression   . Fibromyalgia   . Gastroparesis 03/2011   markedly abnormal gastric emptying study.   . Hematemesis 11/2015   mild to moderate blood in emesis appeared after 2 plus weeks of frequent, non-bloody emesis.   [name] Hemorrhoids   . Hemorrhoids   . History of chicken pox   . Hypokalemia   . Hypothyroid   . Lupus   . 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 Rheumatoid arthritis [facility])     Past Surgical History:  Procedure Laterality Date  . ABDOMINAL HYSTERECTOMY     total  . CESAREAN SECTION     x2  . CHOLECYSTECTOMY    . COLONOSCOPY  11/2013   at [facility] Dr Kennith Gain. Hemorrhoids. sigmoid tics. random biopsies negative for microscopic colitis or any pathology.   . ESOPHAGOGASTRODUODENOSCOPY  11/2013   Dr Kennith Gain at [facility].  Normal study.  Duodenal bx negative for villous atrophy.   . ESOPHAGOGASTRODUODENOSCOPY N/A 12/09/2015   Procedure: ESOPHAGOGASTRODUODENOSCOPY (EGD);  Surgeon: Doran Stabler, MD;  Location: [facility] ENDOSCOPY;  Service: Endoscopy;  Laterality: N/A;  . KNEE SURGERY Left   . OOPHORECTOMY    . REPLACEMENT TOTAL KNEE Bilateral     Social History Social History   Tobacco Use  . Smoking status: Never Smoker  . Smokeless tobacco: Never Used  Substance Use Topics  . Alcohol use: No  . Drug use: No    Family History Family History  Problem Relation Age of Onset  . Leukemia Mother   . Arthritis Mother   . Early death Mother   . Miscarriages / [place] Mother   . Hypothyroidism Sister   . Cancer Paternal Grandmother   . Cancer Paternal Grandfather   . Hypothyroidism Brother     No family history of bleeding or clotting disorders, autoimmune disease or porphyria  Allergies  Allergen  Reactions  . Morphine And Related Hives and Itching    ONLY MORPHINE, tolerates dilaudid, oxycodone, and norco  . Methotrexate Derivatives Other (See Comments)    Headaches   . Relafen [Nabumetone] Other (See Comments)    Headaches  . Haloperidol Nausea Only and Other (See Comments)    Patient stated that she broke out in cold sweats also  . Morphine Hives     REVIEW OF SYSTEMS (Negative unless checked)  Constitutional: [] Weight loss  [] Fever  [] Chills Cardiac: [] Chest pain   [] Chest pressure   [] Palpitations   [] Shortness of breath when laying flat   [] Shortness of breath at rest   [x] Shortness of breath with exertion. Vascular:  [] Pain in legs with walking   [] Pain in legs at rest   [] Pain in legs when laying flat   [] Claudication   [] Pain in feet when walking  [] Pain in feet at rest  [] Pain in feet when laying flat   [] History of DVT   [] Phlebitis   [] Swelling in legs   [] Varicose veins   [] Non-healing ulcers Pulmonary:   [] Uses home oxygen   [] Productive cough   [] Hemoptysis   [] Wheeze  [] COPD   [] Asthma Neurologic:  [] Dizziness  [] Blackouts   [] Seizures   [] History of stroke   [] History of TIA  [] Aphasia   [] Temporary blindness   [] Dysphagia   [] Weakness or numbness in arms   [] Weakness or numbness in legs Musculoskeletal:  [] Arthritis   [] Joint swelling   [] Joint pain   [] Low back pain Hematologic:  [] Easy bruising  [] Easy bleeding   [] Hypercoagulable state   [] Anemic  [] Hepatitis Gastrointestinal:  [] Blood in stool   [] Vomiting blood  [x] Gastroesophageal reflux/heartburn   [] Difficulty swallowing. Genitourinary:  [] Chronic kidney disease   [] Difficult urination  [] Frequent urination  [] Burning with urination   [] Blood in urine Skin:  [] Rashes   [] Ulcers   [] Wounds Psychological:  [] History of anxiety   []  History of major depression.  Physical Examination  There were no vitals filed for this visit. There is no height or weight on file to calculate BMI. Gen: WD/WN, NAD Head:  [place]/AT, No temporalis wasting. Prominent temp pulse not noted. Ear/Nose/Throat: Hearing grossly intact, nares w/o erythema or drainage, oropharynx w/o Erythema/Exudate,  Eyes: Conjunctiva clear, sclera non-icteric Neck: Trachea midline.  No JVD.  Pulmonary:  Good air movement, respirations not labored, no use of accessory muscles.  Cardiac: RRR, normal S1, S2. Vascular: Right-sided Port-A-Cath appears intact and well-healed.  It is nontender to palpation. Vessel Right Left  Radial Palpable Palpable  Ulnar Not Palpable Not Palpable  Brachial Palpable Palpable  Carotid Palpable, without bruit Palpable, without bruit  Gastrointestinal: soft,  non-tender/non-distended. No guarding/reflex.  Musculoskeletal: M/S 5/5 throughout.  Extremities without ischemic changes.  No deformity or atrophy.  Neurologic: Sensation grossly intact in extremities.  Symmetrical.  Speech is fluent. Motor exam as listed above. Psychiatric: Judgment intact, Mood & affect appropriate for pt's clinical situation. Dermatologic: No rashes or ulcers noted.  No cellulitis or open wounds. Lymph : No Cervical, Axillary, or Inguinal lymphadenopathy.   CBC Lab Results  Component Value Date   WBC 4.4 04/06/2017   HGB 12.0 04/06/2017   HCT 38.2 04/06/2017   MCV 91.0 04/06/2017   PLT PLATELET CLUMPS NOTED ON SMEAR, UNABLE TO ESTIMATE 04/06/2017    BMET    Component Value Date/Time   NA 133 (L) 04/06/2017 0641   NA 138 06/02/2013 2252   K 4.8 04/06/2017 0641   K 3.9 06/02/2013 2252   CL 96 (L) 04/06/2017 0641   CL 105 06/02/2013 2252   CO2 23 04/06/2017 0641   CO2 26 06/02/2013 2252   GLUCOSE 87 04/06/2017 0641   GLUCOSE 110 (H) 06/02/2013 2252   BUN 15 04/06/2017 0641   BUN 17 06/02/2013 2252   CREATININE 1.24 (H) 04/06/2017 0641   CREATININE 1.42 (H) 06/02/2013 2252   CALCIUM 9.5 04/06/2017 0641   CALCIUM 9.2 06/02/2013 2252   GFRNONAA 49 (L) 04/06/2017 0641   GFRNONAA 43 (L) 06/02/2013 2252   GFRAA 56 (L)  04/06/2017 0641   GFRAA 50 (L) 06/02/2013 2252   Estimated Creatinine Clearance: 48.4 mL/min (A) (by C-G formula based on SCr of 1.24 mg/dL (H)).  COAG Lab Results  Component Value Date   INR 1.10 12/10/2015    Radiology Ct Abdomen Pelvis W Contrast  Result Date: 04/06/2017 CLINICAL DATA:  Months of abdominal pain. EXAM: CT ABDOMEN AND PELVIS WITH CONTRAST TECHNIQUE: Multidetector CT imaging of the abdomen and pelvis was performed using the standard protocol following bolus administration of intravenous contrast. CONTRAST:  18mL ISOVUE-300 IOPAMIDOL (ISOVUE-300) INJECTION 61% COMPARISON:  February 07, 2017 FINDINGS: Lower chest: No acute abnormality. Hepatobiliary: Hepatic steatosis. Previous cholecystectomy. The portal vein is patent. No other abnormalities in the liver. Pancreas: Unremarkable. No pancreatic ductal dilatation or surrounding inflammatory changes. Spleen: Normal in size without focal abnormality. Adrenals/Urinary Tract: The adrenal glands, kidneys, ureters, and bladder are normal. Stomach/Bowel: The stomach is normal. Most of the small bowel is normal. There may be mild thickening in the distal ileum although this is unclear. Moderate fecal loading in the colon, particularly the transverse colon. There is wall thickening associated with a portion of the ascending colon and the proximal transverse colon. The appendix is normal in caliber. There is a small amount of increased attenuation in the fat near the distal appendix but this is thought to be tracking down the pericolic gutter from the thick walled colon. Vascular/Lymphatic: Atherosclerotic changes seen in the nonaneurysmal aorta. No adenopathy. Reproductive: Status post hysterectomy. No adnexal masses. Other: No abdominal wall hernia or abnormality. No abdominopelvic ascites. Musculoskeletal: No acute or significant osseous findings. IMPRESSION: 1. There is wall thickening associated with a portion of ascending colon and the  proximal transverse colon. There may be minimal thickening associated with the distal ileum as well best seen on coronal images. The findings suggest colitis. Given the chronicity of several months, inflammatory bowel disease should be considered. Infectious causes are possible. Ischemic causes are less likely given chronicity. 2. The appendix is normal in caliber. The small amount of increased attenuation in the fat near the distal appendix appears to be tracking  inferiorly along the pericolic gutter, likely arising from the colon. There is no dilatation or wall thickening associated with the appendix to suggest appendicitis. Recommend clinical correlation. 3. Atherosclerotic change in the abdominal aorta. Electronically Signed   By: Dorise Bullion III M.D   On: 04/06/2017 12:00    Assessment/Plan 1.  Complication vascular access device with inability to aspirate blood:  Patient's right Port-A-Cath is malfunctioning. The patient will undergo angiography and attempted correction of any problems using interventional techniques with the hope of restoring function to the access.  The risks and benefits were described to the patient.  All questions were answered.  The patient agrees to proceed with angiography and intervention.  2.  Chronic abdominal pain:  Patient will continue GI workup without further interruption if a successful intervention is not achieved then a new left-sided Port-A-Cath will be placed.  3.  Hypothyroidism:  Patient will continue medical management; endocrinology is following no changes in oral medications. 4.  Lupus associated with rheumatoid arthritis: Patient will continue following with rheumatology no changes in her current medications     [name], MD  04/18/2017 2:43 PM

## 2017-04-18 NOTE — Op Note (Signed)
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  Contrast injection right IJ port  2.  Removal of right IJ port  3.  Introduction catheter into left innominate vein  4.  Contrast injection left jugular vein as well as central veins    Anesthesia: Conscious sedation was administered by the interventional radiology RN under my direct supervision. IV Versed plus fentanyl were utilized. Continuous ECG, pulse oximetry and blood pressure was monitored throughout the entire procedure. Conscious sedation was administered for a total of 78 minutes.  Sheath: Left IJ 5 French micro sheath  Contrast: 20 cc   Fluoroscopy Time: 12.4 minutes  Indications: Patient presents with nonfunction of her right IJ port.  She is undergoing attempted salvage and/or insertion of a new port.  Risks and benefits of been reviewed all questions answered patient agrees to proceed.  Procedure:  SAMYIAH HALVORSEN a 52 y.o. female who was identified and appropriate procedural time out was performed.  The patient was then placed supine on the table and the right neck and chest wall are prepped and draped in the usual sterile fashion.  Under sterile technique a Huber needle is inserted into the right IJ port.  I confirmed that the port does not aspirate.  It is also very difficult to flush.  Approximately 5 cc of dilute contrast was then injected and demonstrates occlusion of the proximal jugular vein on the right as well as occlusion of the innominate vein.  There is possibly a large collateral or residual lumen that demonstrates a faint reconstitution of the superior vena  cava at the atrial caval junction.  Furthermore, on evaluation of the past films including the post insertion film is quite clear that the tip of the catheter initially was within the right atrium whereas now the tip of the catheter is laying just above the level of the clavicular head.  Given these findings the lack of the proper function of the port, the occlusion of the central venous system as well as the poor positioning of the catheter itself I elected to cut down on the port.  My hope initially was that I could re-wire and negotiate a new catheter down into the atrium.  The connection between the catheter and the hub of the port was exposed and the catheter disconnected.  Glidewire was then advanced through the catheter.  Ultimately Glidewire Kumpe catheter was also used.  Several injections were made and magnified and oblique imaging trying to find a pathway down to the patent proximal superior vena cava. However I was not able to achieve this and I proceeded with removal of the port.  The body of the port was subsequently removed and pressure held at the base of the neck for 5 minutes and the incision closed with a layer 3-0 Vicryl followed by 4-0 Monocryl subcuticular and Dermabond.  Once the Dermabond was dried the left neck was prepped and draped in a sterile fashion.  Ultrasound was placed in a sterile sleeve.  Jugular vein was identified at the base of the left neck.  It was echolucent and demonstrated normal flow on color duplex imaging.  Lidocaine was infiltrated into soft tissues.  A micro needle was then inserted without difficulty  and a single pass.  Microwire was then advanced however it would not advanced much past the midline.  Given this finding I inserted the micro-sheath which extended down to the left innominate vein.  Hand-injection of contrast and do not demonstrated similar findings to the right side where the proximal innominate vein is occluded.  There are extensive collaterals both  proximal and distal to the micro-sheath.  There is no evidence of filling of the superior vena cava on these injections.  My suspicion given the pattern collaterals is that they are extending below the diaphragm filling the inferior vena cava and then returning flow to the heart.  At this point I elected to terminate the procedure.  The sheath was removed from the left internal jugular and pressure held.   Given these findings of extensive central venous occlusion reinsertion of an Infuse-a-Port will be very difficult.  She is demanding a fairly large dose of both fentanyl and Versed.  I feel that for her safety if we are to persist then she should be treated in the operating room under general anesthesia.  Perhaps evaluation of the right side since there does appear to be some faint filling of the superior vena cava would be most helpful.  When she returns to the office to discuss further options I will also obtain a duplex ultrasound of the right internal jugular vein to see if it is patent or occluded.  Based on these findings further treatment options will be reviewed with the patient.   Disposition: Patient was taken to the recovery room in stable condition having tolerated the procedure well.  Belenda Cruise Schnier 04/18/2017,4:20 PM

## 2017-04-19 ENCOUNTER — Telehealth: Payer: Self-pay | Admitting: Gastroenterology

## 2017-04-19 ENCOUNTER — Other Ambulatory Visit: Payer: Self-pay

## 2017-04-19 DIAGNOSIS — R634 Abnormal weight loss: Secondary | ICD-10-CM

## 2017-04-19 DIAGNOSIS — R1084 Generalized abdominal pain: Secondary | ICD-10-CM

## 2017-04-19 NOTE — Telephone Encounter (Signed)
Patient needs to r/s procedure as she has to have a new port put in.

## 2017-04-19 NOTE — Telephone Encounter (Signed)
Patients colonoscopy/egd has been moved to 05/10/17 at [facility] from 03/14.Marland KitchenMarland KitchenI have been unable order procedure due to chart is opened from yesterdays procedure.  Will enter order for EGD/Colon on Monday.  Thanks [employer]

## 2017-04-23 ENCOUNTER — Other Ambulatory Visit: Payer: Self-pay

## 2017-04-23 ENCOUNTER — Encounter: Payer: Self-pay | Admitting: Vascular Surgery

## 2017-04-23 DIAGNOSIS — R1084 Generalized abdominal pain: Secondary | ICD-10-CM

## 2017-05-06 ENCOUNTER — Other Ambulatory Visit: Payer: Medicare Other

## 2017-05-06 ENCOUNTER — Ambulatory Visit: Payer: Medicare Other | Admitting: Oncology

## 2017-05-06 ENCOUNTER — Encounter: Payer: Self-pay | Admitting: *Deleted

## 2017-05-06 ENCOUNTER — Other Ambulatory Visit: Payer: Self-pay

## 2017-05-09 ENCOUNTER — Other Ambulatory Visit: Payer: Self-pay

## 2017-05-09 MED ORDER — NA SULFATE-K SULFATE-MG SULF 17.5-3.13-1.6 GM/177ML PO SOLN: 1.0000 | Freq: Once | ORAL | 0 refills | Status: AC

## 2017-05-09 NOTE — Progress Notes (Signed)
suprep

## 2017-05-09 NOTE — Discharge Instructions (Signed)
General Anesthesia, Adult, Care After °These instructions provide you with information about caring for yourself after your procedure. Your health care provider may also give you more specific instructions. Your treatment has been planned according to current medical practices, but problems sometimes occur. Call your health care provider if you have any problems or questions after your procedure. °What can I expect after the procedure? °After the procedure, it is common to have: °· Vomiting. °· A sore throat. °· Mental slowness. ° °It is common to feel: °· Nauseous. °· Cold or shivery. °· Sleepy. °· Tired. °· Sore or achy, even in parts of your body where you did not have surgery. ° °Follow these instructions at home: °For at least 24 hours after the procedure: °· Do not: °? Participate in activities where you could fall or become injured. °? Drive. °? Use heavy machinery. °? Drink alcohol. °? Take sleeping pills or medicines that cause drowsiness. °? Make important decisions or sign legal documents. °? Take care of children on your own. °· Rest. °Eating and drinking °· If you vomit, drink water, juice, or soup when you can drink without vomiting. °· Drink enough fluid to keep your urine clear or pale yellow. °· Make sure you have little or no nausea before eating solid foods. °· Follow the diet recommended by your health care provider. °General instructions °· Have a responsible adult stay with you until you are awake and alert. °· Return to your normal activities as told by your health care provider. Ask your health care provider what activities are safe for you. °· Take over-the-counter and prescription medicines only as told by your health care provider. °· If you smoke, do not smoke without supervision. °· Keep all follow-up visits as told by your health care provider. This is important. °Contact a health care provider if: °· You continue to have nausea or vomiting at home, and medicines are not helpful. °· You  cannot drink fluids or start eating again. °· You cannot urinate after 8-12 hours. °· You develop a skin rash. °· You have fever. °· You have increasing redness at the site of your procedure. °Get help right away if: °· You have difficulty breathing. °· You have chest pain. °· You have unexpected bleeding. °· You feel that you are having a life-threatening or urgent problem. °This information is not intended to replace advice given to you by your health care provider. Make sure you discuss any questions you have with your health care provider. °Document Released: 05/07/2000 Document Revised: 07/04/2015 Document Reviewed: 01/13/2015 °Elsevier Interactive Patient Education © 2018 [employer] Inc[employer] ° °

## 2017-05-10 ENCOUNTER — Encounter: Payer: Self-pay | Admitting: Gastroenterology

## 2017-05-10 ENCOUNTER — Ambulatory Visit: Payer: Medicare Other | Admitting: Anesthesiology

## 2017-05-10 ENCOUNTER — Encounter
Admission: RE | Disposition: A | Discharge status: Home / Self Care | Payer: Self-pay | Source: Ambulatory Visit | Attending: Gastroenterology

## 2017-05-10 ENCOUNTER — Other Ambulatory Visit (INDEPENDENT_AMBULATORY_CARE_PROVIDER_SITE_OTHER): Payer: Self-pay | Admitting: Vascular Surgery

## 2017-05-10 ENCOUNTER — Ambulatory Visit
Admission: RE | Admit: 2017-05-10 | Discharge: 2017-05-10 | Disposition: A | Discharge status: Home / Self Care | Payer: Medicare Other | Source: Ambulatory Visit | Attending: Gastroenterology | Admitting: Gastroenterology

## 2017-05-10 DIAGNOSIS — M329 Systemic lupus erythematosus, unspecified: Secondary | ICD-10-CM | POA: Diagnosis not present

## 2017-05-10 DIAGNOSIS — K640 First degree hemorrhoids: Secondary | ICD-10-CM | POA: Insufficient documentation

## 2017-05-10 DIAGNOSIS — K2970 Gastritis, unspecified, without bleeding: Secondary | ICD-10-CM

## 2017-05-10 DIAGNOSIS — I82621 Acute embolism and thrombosis of deep veins of right upper extremity: Secondary | ICD-10-CM

## 2017-05-10 DIAGNOSIS — R1084 Generalized abdominal pain: Secondary | ICD-10-CM | POA: Diagnosis not present

## 2017-05-10 DIAGNOSIS — M069 Rheumatoid arthritis, unspecified: Secondary | ICD-10-CM | POA: Diagnosis not present

## 2017-05-10 DIAGNOSIS — E039 Hypothyroidism, unspecified: Secondary | ICD-10-CM | POA: Diagnosis not present

## 2017-05-10 DIAGNOSIS — K3184 Gastroparesis: Secondary | ICD-10-CM | POA: Diagnosis not present

## 2017-05-10 DIAGNOSIS — Z7982 Long term (current) use of aspirin: Secondary | ICD-10-CM | POA: Insufficient documentation

## 2017-05-10 DIAGNOSIS — F329 Major depressive disorder, single episode, unspecified: Secondary | ICD-10-CM | POA: Diagnosis not present

## 2017-05-10 DIAGNOSIS — M797 Fibromyalgia: Secondary | ICD-10-CM | POA: Diagnosis not present

## 2017-05-10 DIAGNOSIS — G43A0 Cyclical vomiting, not intractable: Secondary | ICD-10-CM | POA: Diagnosis not present

## 2017-05-10 DIAGNOSIS — Z79899 Other long term (current) drug therapy: Secondary | ICD-10-CM | POA: Insufficient documentation

## 2017-05-10 DIAGNOSIS — R634 Abnormal weight loss: Secondary | ICD-10-CM

## 2017-05-10 HISTORY — PX: COLONOSCOPY WITH PROPOFOL: SHX5780

## 2017-05-10 HISTORY — PX: ESOPHAGOGASTRODUODENOSCOPY (EGD) WITH PROPOFOL: SHX5813

## 2017-05-10 SURGERY — COLONOSCOPY WITH PROPOFOL
Anesthesia: General | Wound class: Contaminated

## 2017-05-10 MED ORDER — GLYCOPYRROLATE 0.2 MG/ML IJ SOLN
INTRAMUSCULAR | Status: DC | PRN
  Administered 2017-05-10: 0.1 mg via INTRAVENOUS

## 2017-05-10 MED ORDER — LIDOCAINE HCL (CARDIAC) 20 MG/ML IV SOLN
INTRAVENOUS | Status: DC | PRN
  Administered 2017-05-10: 25 mg via INTRAVENOUS

## 2017-05-10 MED ORDER — PROPOFOL 10 MG/ML IV BOLUS
INTRAVENOUS | Status: DC | PRN
  Administered 2017-05-10: 60 mg via INTRAVENOUS
  Administered 2017-05-10: 10 mg via INTRAVENOUS
  Administered 2017-05-10 (×2): 60 mg via INTRAVENOUS
  Administered 2017-05-10 (×2): 10 mg via INTRAVENOUS
  Administered 2017-05-10: 60 mg via INTRAVENOUS

## 2017-05-10 MED ORDER — LACTATED RINGERS IV SOLN
1000.0000 mL | INTRAVENOUS | Status: DC
  Administered 2017-05-10: 10:00:00 via INTRAVENOUS
  Administered 2017-05-10: 1000 mL via INTRAVENOUS

## 2017-05-10 SURGICAL SUPPLY — 36 items
BALLN DILATOR 10-12 8 (BALLOONS)
BALLN DILATOR 12-15 8 (BALLOONS)
BALLN DILATOR 15-18 8 (BALLOONS)
BALLN DILATOR CRE 0-12 8 (BALLOONS)
BALLN DILATOR ESOPH 8 10 CRE (MISCELLANEOUS) IMPLANT
BALLOON DILATOR 12-15 8 (BALLOONS) IMPLANT
BALLOON DILATOR 15-18 8 (BALLOONS) IMPLANT
BALLOON DILATOR CRE 0-12 8 (BALLOONS) IMPLANT
BLOCK BITE 60FR ADLT L/F GRN (MISCELLANEOUS) ×3 IMPLANT
CANISTER SUCT 1200ML W/VALVE (MISCELLANEOUS) ×3 IMPLANT
CLIP HMST 235XBRD CATH ROT (MISCELLANEOUS) IMPLANT
CLIP RESOLUTION 360 11X235 (MISCELLANEOUS)
ELECT REM PT RETURN 9FT ADLT (ELECTROSURGICAL)
ELECTRODE REM PT RTRN 9FT ADLT (ELECTROSURGICAL) IMPLANT
FCP ESCP3.2XJMB 240X2.8X (MISCELLANEOUS)
FORCEPS BIOP RAD 4 LRG CAP 4 (CUTTING FORCEPS) ×2 IMPLANT
FORCEPS BIOP RJ4 240 W/NDL (MISCELLANEOUS)
FORCEPS ESCP3.2XJMB 240X2.8X (MISCELLANEOUS) IMPLANT
GOWN CVR UNV OPN BCK APRN NK (MISCELLANEOUS) ×2 IMPLANT
GOWN ISOL THUMB LOOP REG UNIV (MISCELLANEOUS) ×6
INJECTOR VARIJECT VIN23 (MISCELLANEOUS) IMPLANT
KIT DEFENDO VALVE AND CONN (KITS) IMPLANT
KIT ENDO PROCEDURE OLY (KITS) ×3 IMPLANT
MARKER SPOT ENDO TATTOO 5ML (MISCELLANEOUS) IMPLANT
PROBE APC STR FIRE (PROBE) IMPLANT
RETRIEVER NET PLAT FOOD (MISCELLANEOUS) IMPLANT
RETRIEVER NET ROTH 2.5X230 LF (MISCELLANEOUS) IMPLANT
SNARE SHORT THROW 13M SML OVAL (MISCELLANEOUS) IMPLANT
SNARE SHORT THROW 30M LRG OVAL (MISCELLANEOUS) IMPLANT
SNARE SNG USE RND 15MM (INSTRUMENTS) IMPLANT
SPOT EX ENDOSCOPIC TATTOO (MISCELLANEOUS)
SYR INFLATION 60ML (SYRINGE) IMPLANT
TRAP ETRAP POLY (MISCELLANEOUS) IMPLANT
VARIJECT INJECTOR VIN23 (MISCELLANEOUS)
WATER STERILE IRR 250ML POUR (IV SOLUTION) ×3 IMPLANT
WIRE CRE 18-20MM 8CM F G (MISCELLANEOUS) IMPLANT

## 2017-05-10 NOTE — Anesthesia Postprocedure Evaluation (Signed)
Anesthesia Post Note  Patient: Adrienne Flores  Procedure(s) Performed: COLONOSCOPY WITH PROPOFOL (N/A ) ESOPHAGOGASTRODUODENOSCOPY (EGD) WITH PROPOFOL (N/A )  Patient location during evaluation: PACU Anesthesia Type: General Level of consciousness: awake Pain management: pain level controlled Vital Signs Assessment: post-procedure vital signs reviewed and stable Respiratory status: spontaneous breathing Cardiovascular status: blood pressure returned to baseline Postop Assessment: no headache Anesthetic complications: no    Lavonna Monarch

## 2017-05-10 NOTE — Transfer of Care (Signed)
Immediate Anesthesia Transfer of Care Note  Patient: Adrienne Flores  Procedure(s) Performed: COLONOSCOPY WITH PROPOFOL (N/A ) ESOPHAGOGASTRODUODENOSCOPY (EGD) WITH PROPOFOL (N/A )  Patient Location: PACU  Anesthesia Type: General  Level of Consciousness: awake, alert  and patient cooperative  Airway and Oxygen Therapy: Patient Spontanous Breathing and Patient connected to supplemental oxygen  Post-op Assessment: Post-op Vital signs reviewed, Patient's Cardiovascular Status Stable, Respiratory Function Stable, Patent Airway and No signs of Nausea or vomiting  Post-op Vital Signs: Reviewed and stable  Complications: No apparent anesthesia complications

## 2017-05-10 NOTE — H&P (Signed)
Lucilla Lame, MD [address] [place], [place] [address] Phone:[phone] Fax : [phone]  Primary Care Physician:  [name], MD Primary Gastroenterologist:  Dr. Allen Norris  Pre-Procedure History & Physical: HPI:  Adrienne Flores is a 52 y.o. female is here for an endoscopy and colonoscopy.   Past Medical History:  Diagnosis Date  . Anemia   . At high risk for falls   . Chronic abdominal pain 2008   dates back to at least 2008.  multiple ultrasound, CT scans, x rays at [facility], [place].  [facility] colonoscopy, egd unrevealing.    . Colitis 2015   ? colitis per CTs in 2015, 2016 and 11/2015.  presumed infectious.  colonoscopy in 2015 negative for colitis.   . Constipation   . Cyclic vomiting syndrome 2015   per opinion  of Dr Granville Lewis, IBS specialist at [facility].   . Degenerative disc disease   . Depression   . Fibromyalgia   . Gastroparesis 03/2011   markedly abnormal gastric emptying study.   . Hematemesis 11/2015   mild to moderate blood in emesis appeared after 2 plus weeks of frequent, non-bloody emesis.   [name] Hemorrhoids   . Hemorrhoids   . History of chicken pox   . Hypokalemia   . Hypothyroid   . Lupus   . 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 Rheumatoid arthritis [facility])     Past Surgical History:  Procedure Laterality Date  . ABDOMINAL HYSTERECTOMY     total  . CESAREAN SECTION     x2  . CHOLECYSTECTOMY    . COLONOSCOPY  11/2013   at [facility] Dr Kennith Gain. Hemorrhoids. sigmoid tics. random biopsies negative for microscopic colitis or any pathology.   . ESOPHAGOGASTRODUODENOSCOPY  11/2013   Dr Kennith Gain at [facility].  Normal study.  Duodenal bx negative for villous atrophy.   . ESOPHAGOGASTRODUODENOSCOPY N/A 12/09/2015   Procedure: ESOPHAGOGASTRODUODENOSCOPY (EGD);  Surgeon: Doran Stabler, MD;  Location: [facility] ENDOSCOPY;  Service: Endoscopy;  Laterality: N/A;  . KNEE SURGERY Left   . OOPHORECTOMY    . PORTA CATH INSERTION N/A 04/18/2017   Procedure: PORTA CATH  INSERTION;  Surgeon: Katha Cabal, MD;  Location: [facility] CV LAB;  Service: Cardiovascular;  Laterality: N/A;  . REPLACEMENT TOTAL KNEE Bilateral     Prior to Admission medications   Medication Sig Start Date End Date Taking? Authorizing Provider  aspirin-acetaminophen-caffeine (EXCEDRIN MIGRAINE) [phone] MG tablet Take 1 tablet by mouth every 8 (eight) hours as needed for headache or migraine.    Yes [provider]  dicyclomine (BENTYL) 20 MG tablet Take 20 mg by mouth daily as needed for spasms.  05/08/16  Yes [provider]  ibuprofen (ADVIL,MOTRIN) 200 MG tablet Take 200 mg by mouth every 6 (six) hours as needed for headache or moderate pain.  03/07/17  Yes [provider]  levothyroxine (SYNTHROID, LEVOTHROID) 137 MCG tablet Take 137 mcg by mouth daily before breakfast.    Yes [provider]  traMADol (ULTRAM) 50 MG tablet Take 1 tablet (50 mg total) by mouth every 6 (six) hours as needed. 12/10/16  Yes Lisa Roca, MD  diphenoxylate-atropine (LOMOTIL) 2.5-0.025 MG tablet Take 1 tablet by mouth 4 (four) times daily as needed for diarrhea or loose stools. Patient not taking: Reported on 04/16/2017 12/11/16 12/11/17  Earleen Newport, MD  HYDROcodone-acetaminophen [facility]) 5-325 MG tablet Take 1 tablet by mouth every 6 (six) hours as needed for up to 7  doses for severe pain. Patient not taking: Reported on 04/16/2017 03/09/17   Darel Hong, MD  ondansetron (ZOFRAN ODT) 4 MG disintegrating tablet Take 1 tablet (4 mg total) by mouth every 8 (eight) hours as needed. Patient not taking: Reported on 04/16/2017 02/07/17   Isla Pence, MD  tizanidine (ZANAFLEX) 2 MG capsule TK 1 C PO TID PRF MUSCLE SPASMS 03/30/17   [provider]    Allergies as of 04/23/2017 - Review Complete 04/18/2017  Allergen Reaction Noted  . Morphine and related Hives and Itching 12/02/2012  . Methotrexate derivatives Other (See Comments) 11/10/2012  .  Relafen [nabumetone] Other (See Comments) 01/05/2013  . Haloperidol Nausea Only and Other (See Comments) 01/12/2017  . Morphine Hives 12/10/2012    Family History  Problem Relation Age of Onset  . Leukemia Mother   . Arthritis Mother   . Early death Mother   . Miscarriages / [place] Mother   . Hypothyroidism Sister   . Cancer Paternal Grandmother   . Cancer Paternal Grandfather   . Hypothyroidism Brother     Social History   Socioeconomic History  . Marital status: Married    Spouse name: Not on file  . Number of children: Not on file  . Years of education: Not on file  . Highest education level: Not on file  Occupational History  . Not on file  Social Needs  . Financial resource strain: Not on file  . Food insecurity:    Worry: Not on file    Inability: Not on file  . Transportation needs:    Medical: Not on file    Non-medical: Not on file  Tobacco Use  . Smoking status: Never Smoker  . Smokeless tobacco: Never Used  Substance and Sexual Activity  . Alcohol use: No  . Drug use: No  . Sexual activity: Yes    Partners: Male  Lifestyle  . Physical activity:    Days per week: Not on file    Minutes per session: Not on file  . Stress: Not on file  Relationships  . Social connections:    Talks on phone: Not on file    Gets together: Not on file    Attends religious service: Not on file    Active member of club or organization: Not on file    Attends meetings of clubs or organizations: Not on file    Relationship status: Not on file  . Intimate partner violence:    Fear of current or ex partner: Not on file    Emotionally abused: Not on file    Physically abused: Not on file    Forced sexual activity: Not on file  Other Topics Concern  . Not on file  Social History Narrative  . Not on file    Review of Systems: See HPI, otherwise negative ROS  Physical Exam: BP 109/78   Pulse 75   Temp 97.9 F (36.6 C)   Resp 16   Ht 5\' 1"  (1.549 m)   Wt 156  lb (70.8 kg)   SpO2 100%   BMI 29.48 kg/m  General:   Alert,  pleasant and cooperative in NAD Head:  Normocephalic and atraumatic. Neck:  Supple; no masses or thyromegaly. Lungs:  Clear throughout to auscultation.    Heart:  Regular rate and rhythm. Abdomen:  Soft, nontender and nondistended. Normal bowel sounds, without guarding, and without rebound.   Neurologic:  Alert and  oriented x4;  grossly normal neurologically.  Impression/Plan: Sharyn Lull  DIJON KOHLMAN is here for an endoscopy and colonoscopy to be performed for weight loss and diffuse abd pain.  Risks, benefits, limitations, and alternatives regarding  endoscopy and colonoscopy have been reviewed with the patient.  Questions have been answered.  All parties agreeable.   Lucilla Lame, MD  05/10/2017, 9:13 AM

## 2017-05-10 NOTE — Op Note (Signed)
[facility]astroenterology Patient Name: Adrienne Flores Procedure Date: 05/10/2017 9:44 AM MRN: [id number] Account #: [id number] Date of Birth: [birth year] Admit Type: Outpatient Age: 52 Room: [facility] OR ROOM 01 Gender: Female Note Status: Finalized Procedure:            Colonoscopy Indications:          Weight loss, Incidental abdominal pain noted Providers:            Lucilla Lame MD, MD Referring MD:         Kerin Perna MD, MD (Referring MD) Medicines:            Propofol per Anesthesia Complications:        No immediate complications. Procedure:            Pre-Anesthesia Assessment:                       - Prior to the procedure, a History and Physical was                        performed, and patient medications and allergies were                        reviewed. The patient's tolerance of previous                        anesthesia was also reviewed. The risks and benefits of                        the procedure and the sedation options and risks were                        discussed with the patient. All questions were                        answered, and informed consent was obtained. Prior                        Anticoagulants: The patient has taken no previous                        anticoagulant or antiplatelet agents. ASA Grade                        Assessment: II - A patient with mild systemic disease.                        After reviewing the risks and benefits, the patient was                        deemed in satisfactory condition to undergo the                        procedure.                       After obtaining informed consent, the colonoscope was                        passed under direct vision. Throughout the procedure,  the patient's blood pressure, pulse, and oxygen                        saturations were monitored continuously. The [place] [id number]) was introduced  through the                        anus and advanced to the the terminal ileum. The                        colonoscopy was performed without difficulty. The                        patient tolerated the procedure well. The quality of                        the bowel preparation was fair. Findings:      The perianal and digital rectal examinations were normal.      The terminal ileum appeared normal. Biopsies were taken with a cold       forceps for histology.      Non-bleeding internal hemorrhoids were found during retroflexion. The       hemorrhoids were Grade I (internal hemorrhoids that do not prolapse).      Random biopsies were obtained with cold forceps for histology randomly       in the entire colon. Impression:           - Preparation of the colon was fair.                       - The examined portion of the ileum was normal.                        Biopsied.                       - Non-bleeding internal hemorrhoids.                       - Random biopsies were obtained in the entire colon. Recommendation:       - Discharge patient to home.                       - Resume previous diet.                       - Continue present medications.                       - Await pathology results. Procedure Code(s):    --- Professional ---                       [phone], Colonoscopy, flexible; with biopsy, single or                        multiple Diagnosis Code(s):    --- Professional ---                       R63.4, Abnormal weight loss CPT copyright 2016 American Medical  Association. All rights reserved. The codes documented in this report are preliminary and upon coder review may  be revised to meet current compliance requirements. Lucilla Lame MD, MD 05/10/2017 10:06:17 AM This report has been signed electronically. Number of Addenda: 0 Note Initiated On: 05/10/2017 9:44 AM Scope Withdrawal Time: 0 hours 6 minutes 47 seconds  Total Procedure Duration: 0 hours 9 minutes 15 seconds        [facility]

## 2017-05-10 NOTE — Op Note (Signed)
[facility] Gastroenterology Patient Name: Adrienne Flores Procedure Date: 05/10/2017 9:43 AM MRN: [id number] Account #: [id number] Date of Birth: Jul 13, 1965 Admit Type: Outpatient Age: 52 Room: [facility] OR ROOM 01 Gender: Female Note Status: Finalized Procedure:            Upper GI endoscopy Indications:          Generalized abdominal pain, Weight loss Providers:            Lucilla Lame MD, MD Referring MD:         Kerin Perna MD, MD (Referring MD) Medicines:            Propofol per Anesthesia Complications:        No immediate complications. Procedure:            Pre-Anesthesia Assessment:                       - Prior to the procedure, a History and Physical was                        performed, and patient medications and allergies were                        reviewed. The patient's tolerance of previous                        anesthesia was also reviewed. The risks and benefits of                        the procedure and the sedation options and risks were                        discussed with the patient. All questions were                        answered, and informed consent was obtained. Prior                        Anticoagulants: The patient has taken no previous                        anticoagulant or antiplatelet agents. ASA Grade                        Assessment: II - A patient with mild systemic disease.                        After reviewing the risks and benefits, the patient was                        deemed in satisfactory condition to undergo the                        procedure.                       After obtaining informed consent, the endoscope was                        passed under direct vision. Throughout the procedure,  the patient's blood pressure, pulse, and oxygen                        saturations were monitored continuously. The Olympus                        GIF H180J Endoscope (S#: [id number]) was introduced                         through the mouth, and advanced to the second part of                        duodenum. The upper GI endoscopy was accomplished                        without difficulty. The patient tolerated the procedure                        well. Findings:      The examined esophagus was normal.      Diffuse minimal inflammation characterized by erythema was found in the       entire examined stomach. Biopsies were taken with a cold forceps for       histology.      The examined duodenum was normal. Biopsies were taken with a cold       forceps for histology. Impression:           - Normal esophagus.                       - Gastritis. Biopsied.                       - Normal examined duodenum. Biopsied. Recommendation:       - Discharge patient to home.                       - Resume previous diet.                       - Continue present medications.                       - Await pathology results.                       - Perform a colonoscopy today. Procedure Code(s):    --- Professional ---                       [phone], Esophagogastroduodenoscopy, flexible, transoral;                        with biopsy, single or multiple Diagnosis Code(s):    --- Professional ---                       R63.4, Abnormal weight loss                       R10.84, Generalized abdominal pain                       K29.70, Gastritis, unspecified, without bleeding CPT copyright 2016 American Medical Association. All rights reserved.  The codes documented in this report are preliminary and upon coder review may  be revised to meet current compliance requirements. Lucilla Lame MD, MD 05/10/2017 9:53:39 AM This report has been signed electronically. Number of Addenda: 0 Note Initiated On: 05/10/2017 9:43 AM      [address]

## 2017-05-10 NOTE — Anesthesia Procedure Notes (Signed)
Procedure Name: MAC Performed by: Elfrieda Espino, CRNA Pre-anesthesia Checklist: Patient identified, Emergency Drugs available, Suction available, Patient being monitored and Timeout performed Patient Re-evaluated:Patient Re-evaluated prior to induction Oxygen Delivery Method: Nasal cannula       

## 2017-05-10 NOTE — Anesthesia Preprocedure Evaluation (Addendum)
Anesthesia Evaluation  Patient identified by MRN, date of birth, ID band Patient awake    Reviewed: Allergy & Precautions, NPO status , Patient's Chart, lab work & pertinent test results  Airway Mallampati: III  TM Distance: <3 FB Neck ROM: Full    Dental   Poor dentition, nothing loose:   Pulmonary neg pulmonary ROS,    Pulmonary exam normal breath sounds clear to auscultation       Cardiovascular negative cardio ROS Normal cardiovascular exam Rhythm:Regular Rate:Normal     Neuro/Psych PSYCHIATRIC DISORDERS Depression  Neuromuscular disease    GI/Hepatic Cyclic vomiting syndrome Diverticula   Endo/Other  Hypothyroidism   Renal/GU CRFRenal disease     Musculoskeletal  (+) Arthritis ,   Abdominal (+) + obese,   Peds  Hematology  (+) anemia ,   Anesthesia Other Findings   Reproductive/Obstetrics                            Anesthesia Physical Anesthesia Plan  ASA: III  Anesthesia Plan: General   Post-op Pain Management:    Induction: Intravenous  PONV Risk Score and Plan: TIVA  Airway Management Planned: Natural Airway  Additional Equipment: None  Intra-op Plan:   Post-operative Plan:   Informed Consent: I have reviewed the patients History and Physical, chart, labs and discussed the procedure including the risks, benefits and alternatives for the proposed anesthesia with the patient or authorized representative who has indicated his/her understanding and acceptance.     Plan Discussed with: CRNA, Anesthesiologist and Surgeon  Anesthesia Plan Comments:         Anesthesia Quick Evaluation

## 2017-05-13 ENCOUNTER — Encounter: Payer: Self-pay | Admitting: Oncology

## 2017-05-13 ENCOUNTER — Inpatient Hospital Stay: Payer: Medicare Other | Attending: Oncology | Admitting: Oncology

## 2017-05-13 ENCOUNTER — Inpatient Hospital Stay: Payer: Medicare Other

## 2017-05-13 ENCOUNTER — Encounter (INDEPENDENT_AMBULATORY_CARE_PROVIDER_SITE_OTHER): Payer: Self-pay | Admitting: Vascular Surgery

## 2017-05-13 ENCOUNTER — Ambulatory Visit (INDEPENDENT_AMBULATORY_CARE_PROVIDER_SITE_OTHER): Payer: Medicare Other

## 2017-05-13 ENCOUNTER — Ambulatory Visit (INDEPENDENT_AMBULATORY_CARE_PROVIDER_SITE_OTHER): Payer: Medicare Other | Admitting: Vascular Surgery

## 2017-05-13 VITALS — BP 106/77 | HR 80 | Resp 16 | Ht 61.0 in | Wt 161.4 lb

## 2017-05-13 VITALS — BP 102/72 | HR 75 | Temp 99.4°F | Ht 61.0 in | Wt 160.7 lb

## 2017-05-13 DIAGNOSIS — R109 Unspecified abdominal pain: Secondary | ICD-10-CM | POA: Diagnosis not present

## 2017-05-13 DIAGNOSIS — I82621 Acute embolism and thrombosis of deep veins of right upper extremity: Secondary | ICD-10-CM

## 2017-05-13 DIAGNOSIS — K529 Noninfective gastroenteritis and colitis, unspecified: Secondary | ICD-10-CM | POA: Diagnosis not present

## 2017-05-13 DIAGNOSIS — D509 Iron deficiency anemia, unspecified: Secondary | ICD-10-CM | POA: Diagnosis present

## 2017-05-13 DIAGNOSIS — T82599D Other mechanical complication of unspecified cardiac and vascular devices and implants, subsequent encounter: Secondary | ICD-10-CM

## 2017-05-13 DIAGNOSIS — I871 Compression of vein: Secondary | ICD-10-CM

## 2017-05-13 DIAGNOSIS — R110 Nausea: Secondary | ICD-10-CM | POA: Insufficient documentation

## 2017-05-13 DIAGNOSIS — D649 Anemia, unspecified: Secondary | ICD-10-CM | POA: Insufficient documentation

## 2017-05-13 DIAGNOSIS — R5383 Other fatigue: Secondary | ICD-10-CM | POA: Insufficient documentation

## 2017-05-13 DIAGNOSIS — D472 Monoclonal gammopathy: Secondary | ICD-10-CM | POA: Insufficient documentation

## 2017-05-13 DIAGNOSIS — R5381 Other malaise: Secondary | ICD-10-CM | POA: Diagnosis not present

## 2017-05-13 DIAGNOSIS — R634 Abnormal weight loss: Secondary | ICD-10-CM | POA: Diagnosis not present

## 2017-05-13 DIAGNOSIS — E538 Deficiency of other specified B group vitamins: Secondary | ICD-10-CM | POA: Insufficient documentation

## 2017-05-13 DIAGNOSIS — R778 Other specified abnormalities of plasma proteins: Secondary | ICD-10-CM

## 2017-05-13 LAB — CBC WITH DIFFERENTIAL/PLATELET
BASOS ABS: 0.1 10*3/uL (ref 0–0.1)
BASOS PCT: 1 %
Eosinophils Absolute: 0.1 10*3/uL (ref 0–0.7)
Eosinophils Relative: 2 %
HCT: 31.6 % — ABNORMAL LOW (ref 35.0–47.0)
Hemoglobin: 10.6 g/dL — ABNORMAL LOW (ref 12.0–16.0)
LYMPHS PCT: 31 %
Lymphs Abs: 1.6 10*3/uL (ref 1.0–3.6)
MCH: 29.7 pg (ref 26.0–34.0)
MCHC: 33.6 g/dL (ref 32.0–36.0)
MCV: 88.3 fL (ref 80.0–100.0)
Monocytes Absolute: 0.3 10*3/uL (ref 0.2–0.9)
Monocytes Relative: 6 %
NEUTROS ABS: 3.1 10*3/uL (ref 1.4–6.5)
Neutrophils Relative %: 60 %
PLATELETS: 235 10*3/uL (ref 150–440)
RBC: 3.58 MIL/uL — AB (ref 3.80–5.20)
RDW: 16.8 % — ABNORMAL HIGH (ref 11.5–14.5)
WBC: 5.2 10*3/uL (ref 3.6–11.0)

## 2017-05-13 LAB — IRON AND TIBC
Iron: 20 ug/dL — ABNORMAL LOW (ref 28–170)
Saturation Ratios: 6 % — ABNORMAL LOW (ref 10.4–31.8)
TIBC: 361 ug/dL (ref 250–450)
UIBC: 341 ug/dL

## 2017-05-13 LAB — FERRITIN: FERRITIN: 24 ng/mL (ref 11–307)

## 2017-05-13 NOTE — Progress Notes (Signed)
Hematology/Oncology Consult note [facility]  Telephone:([phone] Fax:([phone]) [phone]  Patient Care Team: [name], MD as PCP - General (Family Medicine)   Name of the patient: Adrienne Flores  [id number]  February 08, 1966   Date of visit: 05/13/17   Diagnosis- 1. Leukopenia- resolved  2. Normocytic anemia likely due to chronic disease  3. 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 Patient had a normal CBC up until 10/02/2016when her white count was 5.1, H&H was 11.7/34.5 and a platelet count of 155. Since then she has developed a gradually leukopenia and since October 2017 her white count has been between 2.7-3.9. Also she has had worsening anemia and her hemoglobin has been between 8.4-11. Her platelet counts have been more or less normal. Last CBC from 12/28/2015 showed a white count of 3.3, H&H of 8.4/25.7 and platelet count of 168. Differential on the CBC showed a normal neutrophil count with relative lymphocytopenia. BMP done one month ago showed hyponatremia with a sodium of 131, potassium of 2.9. LFTs were within normal limits. Ferritin in October 2017 was normal at 257. Folate was normal at 6.9. Haptoglobin was normal at 92. TSH in October 2017 was elevated at 37.43. T3 was low at 39. T4 was normal at 1.11. B12 was normal at 959. Patient has had multiple ER visits for abdominal pain nausea vomiting as well as constipation. She has been following up with GI at [place]. Last CT abdomen on 12/09/2015 showed mild diffuse circumferential colonic wall thickening involving the ascending and transverse colon. There was no evidence of any lymphadenopathy or malignancy. She has a history of hypothyroidism but had not been compliant with her medications in the past. Also has a  history of rheumatoid arthritis. She was treated with methotrexate for the same but could not tolerate it and has been off medications for a while. She reports no active rheumatoid arthritis symptoms and has not followed up with rheumatology in quite some time.  Currently patient reports a 40 pound weight loss over the last 1 year. She also has significant fatigue. She has had multiple ER admissions for abdominal pain. She continues to have problems with nausea and vomiting which waxes and wanes. Someday she is able to eat well but some days she says that her abdominal pain is so bad that she prefers not to eat for a few days. She has had EGD colonoscopy and multiple CT scans off her abdomen and no clear etiology of her abdominal pain has been ascertained.  Results of blood work from 02/09/2016 were as follows: CBC showed a normal white count of 7.8, H&H of 11.6/35.1 and platelet count of 387. Folate levels were slightly low at 4.8. B12 level was normal at 488. HIV testing was negative. Peripheral flow cytometry did not reveal any significant immunophenotyping abnormality. A lambda free light chain ratio was normal at 1. SPEP did not reveal any monoclonal protein however in no cessation showed a small IgA monoclonal protein with lambda light chain sensitivity. ANA testing was negative. TSH was elevated at 75.19. T3 levels were low at 1.3.    Interval history- she still continues to have on and off abdominal pain especially with food. She had been taking tramadol prior tto meals with some relief. Seen by Dr. Allen Norris and recently underwent  EGD and colonoscopy which showed gastritis and internal hemorrhoids. She will be going to pain clinic soon for her abdominal pain. She has lost 10 pounds over last 6 months  ECOG PS- 1 Pain scale- 5- chronic abdominal pain   Review of systems- Review of Systems  Constitutional: Positive for malaise/fatigue and weight loss. Negative for chills and fever.  HENT:  Negative for congestion, ear discharge and nosebleeds.   Eyes: Negative for blurred vision.  Respiratory: Negative for cough, hemoptysis, sputum production, shortness of breath and wheezing.   Cardiovascular: Negative for chest pain, palpitations, orthopnea and claudication.  Gastrointestinal: Positive for abdominal pain and nausea. Negative for blood in stool, constipation, diarrhea, heartburn, melena and vomiting.  Genitourinary: Negative for dysuria, flank pain, frequency, hematuria and urgency.  Musculoskeletal: Negative for back pain, joint pain and myalgias.  Skin: Negative for rash.  Neurological: Negative for dizziness, tingling, focal weakness, seizures, weakness and headaches.  Endo/Heme/Allergies: Does not bruise/bleed easily.  Psychiatric/Behavioral: Negative for depression and suicidal ideas. The patient does not have insomnia.       Allergies  Allergen Reactions  . Morphine And Related Hives and Itching    ONLY MORPHINE, tolerates dilaudid, oxycodone, and norco  . Methotrexate Derivatives Other (See Comments)    Headaches   . Relafen [Nabumetone] Other (See Comments)    Headaches  . Haloperidol Nausea Only and Other (See Comments)    Patient stated that she broke out in cold sweats also  . Morphine Hives     Past Medical History:  Diagnosis Date  . Anemia   . At high risk for falls   . Chronic abdominal pain 2008   dates back to at least 2008.  multiple ultrasound, CT scans, x rays at [facility], [place].  [facility] colonoscopy, egd unrevealing.    . Colitis 2015   ? colitis per CTs in 2015, 2016 and 11/2015.  presumed infectious.  colonoscopy in 2015 negative for colitis.   . Constipation   . Cyclic vomiting syndrome 2015   per opinion  of Dr Granville Lewis, IBS specialist at [facility].   . Degenerative disc disease   . Depression   . Fibromyalgia   . Gastroparesis 03/2011   markedly abnormal gastric emptying study.   . Hematemesis 11/2015   mild to moderate blood  in emesis appeared after 2 plus weeks of frequent, non-bloody emesis.   [name] Hemorrhoids   . Hemorrhoids   . History of chicken pox   . Hypokalemia   . Hypothyroid   . Lupus ([place])   . 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 Rheumatoid arthritis [facility])      Past Surgical History:  Procedure Laterality Date  . ABDOMINAL HYSTERECTOMY     total  . CESAREAN SECTION     x2  . CHOLECYSTECTOMY    . COLONOSCOPY  11/2013   at [facility] Dr Kennith Gain. Hemorrhoids. sigmoid tics. random biopsies negative for microscopic colitis or any pathology.   . COLONOSCOPY WITH PROPOFOL N/A 05/10/2017   Procedure: COLONOSCOPY WITH PROPOFOL;  Surgeon: Lucilla Lame, MD;  Location: [place];  Service: Endoscopy;  Laterality: N/A;  . ESOPHAGOGASTRODUODENOSCOPY  11/2013   Dr Kennith Gain at [facility].  Normal study.  Duodenal bx negative for villous atrophy.   . ESOPHAGOGASTRODUODENOSCOPY N/A 12/09/2015   Procedure: ESOPHAGOGASTRODUODENOSCOPY (EGD);  Surgeon: Doran Stabler, MD;  Location: [facility] ENDOSCOPY;  Service: Endoscopy;  Laterality: N/A;  . ESOPHAGOGASTRODUODENOSCOPY (EGD) WITH PROPOFOL N/A 05/10/2017   Procedure: ESOPHAGOGASTRODUODENOSCOPY (EGD) WITH PROPOFOL;  Surgeon: Lucilla Lame, MD;  Location: [place];  Service: Endoscopy;  Laterality: N/A;  . KNEE SURGERY Left   . OOPHORECTOMY    . PORTA CATH INSERTION N/A 04/18/2017   Procedure: PORTA CATH INSERTION;  Surgeon: Katha Cabal, MD;  Location: [place] CV LAB;  Service: Cardiovascular;  Laterality: N/A;  . REPLACEMENT TOTAL KNEE Bilateral     Social History   Socioeconomic History  . Marital status: Married    Spouse name: Not on file  . Number of children: Not on file  . Years of education: Not on file  . Highest education level: Not on file  Occupational History  . Not on file  Social Needs  . Financial resource strain: Not on file  . Food insecurity:    Worry: Not on file    Inability: Not on file  . Transportation needs:    Medical: Not on file      Non-medical: Not on file  Tobacco Use  . Smoking status: Never Smoker  . Smokeless tobacco: Never Used  Substance and Sexual Activity  . Alcohol use: No  . Drug use: No  . Sexual activity: Yes    Partners: Male  Lifestyle  . Physical activity:    Days per week: Not on file    Minutes per session: Not on file  . Stress: Not on file  Relationships  . Social connections:    Talks on phone: Not on file    Gets together: Not on file    Attends religious service: Not on file    Active member of club or organization: Not on file    Attends meetings of clubs or organizations: Not on file    Relationship status: Not on file  . Intimate partner violence:    Fear of current or ex partner: Not on file    Emotionally abused: Not on file    Physically abused: Not on file    Forced sexual activity: Not on file  Other Topics Concern  . Not on file  Social History Narrative  . Not on file    Family History  Problem Relation Age of Onset  . Leukemia Mother   . Arthritis Mother   . Early death Mother   . Miscarriages / [place] Mother   . Hypothyroidism Sister   . Cancer Paternal Grandmother   . Cancer Paternal Grandfather   . Hypothyroidism Brother      Current Outpatient Medications:  .  dicyclomine (BENTYL) 20 MG tablet, Take 20 mg by mouth daily as needed for spasms. , Disp: , Rfl:  .  levothyroxine (SYNTHROID, LEVOTHROID) 137 MCG tablet, Take 137 mcg by mouth daily before breakfast. , Disp: , Rfl:  .  aspirin-acetaminophen-caffeine (EXCEDRIN MIGRAINE) 250-250-65 MG tablet, Take 1 tablet by mouth every 8 (eight) hours as needed for headache or migraine. , Disp: , Rfl:  .  diphenoxylate-atropine (LOMOTIL) 2.5-0.025 MG tablet, Take 1 tablet by mouth 4 (four) times daily as needed for diarrhea or loose stools. (Patient not taking: Reported on 04/16/2017), Disp: 30 tablet, Rfl: 1 .  HYDROcodone-acetaminophen (NORCO) 5-325 MG tablet, Take 1 tablet by mouth every 6 (six) hours as  needed for up to 7 doses for severe pain. (Patient not taking: Reported on 04/16/2017), Disp: 7 tablet, Rfl: 0 .  ibuprofen (ADVIL,MOTRIN) 200 MG tablet, Take 200 mg by mouth every 6 (six) hours as needed for headache or moderate pain. , Disp: , Rfl: 0 .  ondansetron (ZOFRAN  ODT) 4 MG disintegrating tablet, Take 1 tablet (4 mg total) by mouth every 8 (eight) hours as needed. (Patient not taking: Reported on 05/13/2017), Disp: 10 tablet, Rfl: 0 .  tizanidine (ZANAFLEX) 2 MG capsule, TK 1 C PO TID PRF MUSCLE SPASMS, Disp: , Rfl:  .  traMADol (ULTRAM) 50 MG tablet, Take 1 tablet (50 mg total) by mouth every 6 (six) hours as needed. (Patient not taking: Reported on 05/13/2017), Disp: 10 tablet, Rfl: 0  Physical exam:  Vitals:   05/13/17 0913  BP: 102/72  Pulse: 75  Temp: 99.4 F (37.4 C)  TempSrc: Tympanic  Weight: 160 lb 11.5 oz (72.9 kg)  Height: 5\' 1"  (1.549 m)   Physical Exam  Constitutional: She is oriented to person, place, and time.  Thin, appears in mild distress from abdominal pain  HENT:  Head: Normocephalic and atraumatic.  Eyes: Pupils are equal, round, and reactive to light. EOM are normal.  Neck: Normal range of motion.  Cardiovascular: Normal rate, regular rhythm and normal heart sounds.  Pulmonary/Chest: Effort normal and breath sounds normal.  Abdominal: Soft. Bowel sounds are normal.  Mild diffuse TTP  Neurological: She is alert and oriented to person, place, and time.  Skin: Skin is warm and dry.     CMP Latest Ref Rng & Units 04/06/2017  Glucose 65 - 99 mg/dL 87  BUN 6 - 20 mg/dL 15  Creatinine 0.44 - 1.00 mg/dL 1.24(H)  Sodium 135 - 145 mmol/L 133(L)  Potassium 3.5 - 5.1 mmol/L 4.8  Chloride 101 - 111 mmol/L 96(L)  CO2 22 - 32 mmol/L 23  Calcium 8.9 - 10.3 mg/dL 9.5  Total Protein 6.5 - 8.1 g/dL 9.2(H)  Total Bilirubin 0.3 - 1.2 mg/dL 1.2  Alkaline Phos 38 - 126 U/L 99  AST 15 - 41 U/L 71(H)  ALT 14 - 54 U/L 44   CBC Latest Ref Rng & Units 05/13/2017  WBC 3.6 -  11.0 K/uL 5.2  Hemoglobin 12.0 - 16.0 g/dL 10.6(L)  Hematocrit 35.0 - 47.0 % 31.6(L)  Platelets 150 - 440 K/uL 235     Assessment and plan- Patient is a 52 y.o. female with chronic abdominal pain here for follow up of leukopenia and anemia  1. Leukopenia/ lymphopenia- resolved  2. Normocytic anemia- she had some evidence of iron deficiency as well as folate deficiency in the past. Iron studies from today are pending. Her baseline hb is between 10-11 likely due to chronic disease and is currently at baseline  3. IgA lambda monoclonal protein- only seen on IFE not on spep. Continue to monitor  In 6 months- labs only- cbc with diff, myeloma panel and folate 12 months. Labs- cbc with diff, folate, myeloma panel, serum free light chains and see MD     Visit Diagnosis 1. Iron deficiency anemia, unspecified iron deficiency anemia type   2. Abnormal SPEP   3. Normocytic anemia   4. Folate deficiency      Dr. Randa Evens, MD, MPH [facility] Pager- [phone] 05/13/2017 10:05 AM

## 2017-05-14 ENCOUNTER — Encounter: Payer: Self-pay | Admitting: Gastroenterology

## 2017-05-14 ENCOUNTER — Encounter (INDEPENDENT_AMBULATORY_CARE_PROVIDER_SITE_OTHER): Payer: Self-pay

## 2017-05-15 ENCOUNTER — Encounter: Payer: Self-pay | Admitting: Gastroenterology

## 2017-05-16 ENCOUNTER — Encounter (INDEPENDENT_AMBULATORY_CARE_PROVIDER_SITE_OTHER): Payer: Self-pay | Admitting: Vascular Surgery

## 2017-05-16 DIAGNOSIS — I871 Compression of vein: Secondary | ICD-10-CM | POA: Insufficient documentation

## 2017-05-16 DIAGNOSIS — T82599A Other mechanical complication of unspecified cardiac and vascular devices and implants, initial encounter: Secondary | ICD-10-CM | POA: Insufficient documentation

## 2017-05-16 NOTE — Progress Notes (Signed)
MRN : [id number]  Adrienne Flores is a 52 y.o. ([birth year]) female who presents with chief complaint of  Chief Complaint  Patient presents with  . Follow-up    [facility] 2wk duplex ultrasound  .  History of Present Illness:   The patient returns to the office for evaluation for venous access.  Previous Infusiport was nonfunctioning and removed from the right side (right IJ approach was used.  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  Patient denies pain at the site no drainage or swelling.  Current Meds  Medication Sig  . aspirin-acetaminophen-caffeine (EXCEDRIN MIGRAINE) 250-250-65 MG tablet Take 1 tablet by mouth every 8 (eight) hours as needed for headache or migraine.   . dicyclomine (BENTYL) 20 MG tablet Take 20 mg by mouth daily as needed for spasms.   [name] ibuprofen (ADVIL,MOTRIN) 200 MG tablet Take 200 mg by mouth every 6 (six) hours as needed for headache or moderate pain.   [name] levothyroxine (SYNTHROID, LEVOTHROID) 137 MCG tablet Take 137 mcg by mouth daily before breakfast.   . tizanidine (ZANAFLEX) 2 MG capsule TK 1 C PO TID PRF MUSCLE SPASMS    Past Medical History:  Diagnosis Date  . Anemia   . At high risk for falls   . Chronic abdominal pain 2008   dates back to at least 2008.  multiple ultrasound, CT scans, x rays at [facility], [place].  [facility] colonoscopy, egd unrevealing.    . Colitis 2015   ? colitis per CTs in 2015, 2016 and 11/2015.  presumed infectious.  colonoscopy in 2015 negative for colitis.   . Constipation   . Cyclic vomiting syndrome 2015   per opinion  of Dr Granville Lewis, IBS specialist at [facility].   . Degenerative disc disease   . Depression   . Fibromyalgia   . Gastroparesis 03/2011   markedly abnormal gastric emptying study.   . Hematemesis 11/2015   mild to moderate blood in emesis appeared after 2 plus weeks of frequent, non-bloody emesis.   [name] Hemorrhoids   . Hemorrhoids   . History of chicken pox   . Hypokalemia   . Hypothyroid    . Lupus ([place])   . 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 Rheumatoid arthritis [facility])     Past Surgical History:  Procedure Laterality Date  . ABDOMINAL HYSTERECTOMY     total  . CESAREAN SECTION     x2  . CHOLECYSTECTOMY    . COLONOSCOPY  11/2013   at [facility] Dr Kennith Gain. Hemorrhoids. sigmoid tics. random biopsies negative for microscopic colitis or any pathology.   . COLONOSCOPY WITH PROPOFOL N/A 05/10/2017   Procedure: COLONOSCOPY WITH PROPOFOL;  Surgeon: Lucilla Lame, MD;  Location: [place];  Service: Endoscopy;  Laterality: N/A;  . ESOPHAGOGASTRODUODENOSCOPY  11/2013   Dr Kennith Gain at [facility].  Normal study.  Duodenal bx negative for villous atrophy.   . ESOPHAGOGASTRODUODENOSCOPY N/A 12/09/2015   Procedure: ESOPHAGOGASTRODUODENOSCOPY (EGD);  Surgeon: Doran Stabler, MD;  Location: [facility] ENDOSCOPY;  Service: Endoscopy;  Laterality: N/A;  . ESOPHAGOGASTRODUODENOSCOPY (EGD) WITH PROPOFOL N/A 05/10/2017   Procedure: ESOPHAGOGASTRODUODENOSCOPY (EGD) WITH PROPOFOL;  Surgeon: Lucilla Lame, MD;  Location: [place];  Service: Endoscopy;  Laterality: N/A;  . KNEE SURGERY Left   . OOPHORECTOMY    . PORTA CATH INSERTION N/A 04/18/2017   Procedure: PORTA CATH INSERTION;  Surgeon: Katha Cabal, MD;  Location: [place] CV LAB;  Service: Cardiovascular;  Laterality: N/A;  .  REPLACEMENT TOTAL KNEE Bilateral     Social History Social History   Tobacco Use  . Smoking status: Never Smoker  . Smokeless tobacco: Never Used  Substance Use Topics  . Alcohol use: No  . Drug use: No    Family History Family History  Problem Relation Age of Onset  . Leukemia Mother   . Arthritis Mother   . Early death Mother   . Miscarriages / [place] Mother   . Hypothyroidism Sister   . Cancer Paternal Grandmother   . Cancer Paternal Grandfather   . Hypothyroidism Brother     Allergies  Allergen Reactions  . Morphine And Related Hives and Itching    ONLY MORPHINE, tolerates dilaudid, oxycodone,  and norco  . Methotrexate Derivatives Other (See Comments)    Headaches   . Relafen [Nabumetone] Other (See Comments)    Headaches  . Haloperidol Nausea Only and Other (See Comments)    Patient stated that she broke out in cold sweats also  . Morphine Hives     REVIEW OF SYSTEMS (Negative unless checked)  Constitutional: [] Weight loss  [] Fever  [] Chills Cardiac: [] Chest pain   [] Chest pressure   [] Palpitations   [] Shortness of breath when laying flat   [] Shortness of breath with exertion. Vascular:  [] Pain in legs with walking   [] Pain in legs at rest  [] History of DVT   [] Phlebitis   [] Swelling in legs   [] Varicose veins   [] Non-healing ulcers Pulmonary:   [] Uses home oxygen   [] Productive cough   [] Hemoptysis   [] Wheeze  [] COPD   [] Asthma Neurologic:  [] Dizziness   [] Seizures   [] History of stroke   [] History of TIA  [] Aphasia   [] Vissual changes   [] Weakness or numbness in arm   [] Weakness or numbness in leg Musculoskeletal:   [] Joint swelling   [] Joint pain   [] Low back pain Hematologic:  [] Easy bruising  [] Easy bleeding   [] Hypercoagulable state   [] Anemic Gastrointestinal:  [] Diarrhea   [] Vomiting  [] Gastroesophageal reflux/heartburn   [] Difficulty swallowing. Genitourinary:  [] Chronic kidney disease   [] Difficult urination  [] Frequent urination   [] Blood in urine Skin:  [] Rashes   [] Ulcers  Psychological:  [] History of anxiety   []  History of major depression.  Physical Examination  Vitals:   05/13/17 1604  BP: 106/77  Pulse: 80  Resp: 16  Weight: 161 lb 6.4 oz (73.2 kg)  Height: 5\' 1"  (1.549 m)   Body mass index is 30.5 kg/m. Gen: WD/WN, NAD Head: [place]/AT, No temporalis wasting.  Ear/Nose/Throat: Hearing grossly intact, nares w/o erythema or drainage Eyes: PER, EOMI, sclera nonicteric.  Neck: Supple, no large masses.   Pulmonary:  Good air movement, no audible wheezing bilaterally, no use of accessory muscles.  Cardiac: RRR, no JVD Vascular:  Old port site is  CD&I Vessel Right Left  Radial Palpable Palpable  Gastrointestinal: Non-distended. No guarding/no peritoneal signs.  Musculoskeletal: M/S 5/5 throughout.  No deformity or atrophy.  Neurologic: CN 2-12 intact. Symmetrical.  Speech is fluent. Motor exam as listed above. Psychiatric: Judgment intact, Mood & affect appropriate for pt's clinical situation. Dermatologic: No rashes or ulcers noted.  No changes consistent with cellulitis. Lymph : No lichenification or skin changes of chronic lymphedema.  CBC Lab Results  Component Value Date   WBC 5.2 05/13/2017   HGB 10.6 (L) 05/13/2017   HCT 31.6 (L) 05/13/2017   MCV 88.3 05/13/2017   PLT 235 05/13/2017    BMET    Component Value  Date/Time   NA 133 (L) 04/06/2017 0641   NA 138 06/02/2013 2252   K 4.8 04/06/2017 0641   K 3.9 06/02/2013 2252   CL 96 (L) 04/06/2017 0641   CL 105 06/02/2013 2252   CO2 23 04/06/2017 0641   CO2 26 06/02/2013 2252   GLUCOSE 87 04/06/2017 0641   GLUCOSE 110 (H) 06/02/2013 2252   BUN 15 04/06/2017 0641   BUN 17 06/02/2013 2252   CREATININE 1.24 (H) 04/06/2017 0641   CREATININE 1.42 (H) 06/02/2013 2252   CALCIUM 9.5 04/06/2017 0641   CALCIUM 9.2 06/02/2013 2252   GFRNONAA 49 (L) 04/06/2017 0641   GFRNONAA 43 (L) 06/02/2013 2252   GFRAA 56 (L) 04/06/2017 0641   GFRAA 50 (L) 06/02/2013 2252   CrCl cannot be calculated (Patient's most recent lab result is older than the maximum 21 days allowed.).  COAG Lab Results  Component Value Date   INR 1.10 12/10/2015    Radiology No results found.  Assessment/Plan 1. Mechanical complication of vascular device, subsequent encounter I have reviewed the images from the venogram with the patient and her husband.  I will plan to place a right IJ Infusiport  Risk and benefits were reviewed the patient.  Indications for the procedure were reviewed.  All questions were answered, the patient agrees to proceed.   2. SVC syndrome See #1  3. Noninfectious  gastroenteritis, unspecified type This is the indication for the port given her very poor IV access    [name], MD  05/16/2017 6:55 AM

## 2017-05-20 ENCOUNTER — Other Ambulatory Visit (INDEPENDENT_AMBULATORY_CARE_PROVIDER_SITE_OTHER): Payer: Self-pay | Admitting: Vascular Surgery

## 2017-05-21 ENCOUNTER — Ambulatory Visit: Payer: Medicare Other | Admitting: Anesthesiology

## 2017-05-21 ENCOUNTER — Ambulatory Visit
Admission: RE | Admit: 2017-05-21 | Discharge: 2017-05-21 | Disposition: A | Discharge status: Home / Self Care | Payer: Medicare Other | Source: Ambulatory Visit | Attending: Vascular Surgery | Admitting: Vascular Surgery

## 2017-05-21 ENCOUNTER — Encounter: Payer: Self-pay | Admitting: Anesthesiology

## 2017-05-21 ENCOUNTER — Encounter
Admission: RE | Disposition: A | Discharge status: Home / Self Care | Payer: Self-pay | Source: Ambulatory Visit | Attending: Vascular Surgery

## 2017-05-21 DIAGNOSIS — I871 Compression of vein: Secondary | ICD-10-CM | POA: Insufficient documentation

## 2017-05-21 DIAGNOSIS — I82511 Chronic embolism and thrombosis of right femoral vein: Secondary | ICD-10-CM

## 2017-05-21 DIAGNOSIS — M797 Fibromyalgia: Secondary | ICD-10-CM | POA: Diagnosis not present

## 2017-05-21 DIAGNOSIS — E039 Hypothyroidism, unspecified: Secondary | ICD-10-CM | POA: Diagnosis not present

## 2017-05-21 DIAGNOSIS — K2970 Gastritis, unspecified, without bleeding: Secondary | ICD-10-CM | POA: Diagnosis not present

## 2017-05-21 DIAGNOSIS — K529 Noninfective gastroenteritis and colitis, unspecified: Secondary | ICD-10-CM | POA: Insufficient documentation

## 2017-05-21 DIAGNOSIS — M1990 Unspecified osteoarthritis, unspecified site: Secondary | ICD-10-CM | POA: Insufficient documentation

## 2017-05-21 DIAGNOSIS — M5136 Other intervertebral disc degeneration, lumbar region: Secondary | ICD-10-CM | POA: Diagnosis not present

## 2017-05-21 DIAGNOSIS — M5030 Other cervical disc degeneration, unspecified cervical region: Secondary | ICD-10-CM | POA: Insufficient documentation

## 2017-05-21 DIAGNOSIS — N189 Chronic kidney disease, unspecified: Secondary | ICD-10-CM | POA: Insufficient documentation

## 2017-05-21 DIAGNOSIS — I82411 Acute embolism and thrombosis of right femoral vein: Secondary | ICD-10-CM

## 2017-05-21 DIAGNOSIS — K3184 Gastroparesis: Secondary | ICD-10-CM | POA: Diagnosis not present

## 2017-05-21 DIAGNOSIS — M5134 Other intervertebral disc degeneration, thoracic region: Secondary | ICD-10-CM | POA: Diagnosis not present

## 2017-05-21 DIAGNOSIS — M810 Age-related osteoporosis without current pathological fracture: Secondary | ICD-10-CM | POA: Insufficient documentation

## 2017-05-21 HISTORY — PX: PORTA CATH INSERTION: CATH118285

## 2017-05-21 SURGERY — PORTA CATH INSERTION
Anesthesia: General | Laterality: Right

## 2017-05-21 MED ORDER — FENTANYL CITRATE (PF) 100 MCG/2ML IJ SOLN
INTRAMUSCULAR | Status: AC
  Administered 2017-05-21: 25 ug via INTRAVENOUS
  Filled 2017-05-21: qty 2

## 2017-05-21 MED ORDER — MIDAZOLAM HCL 2 MG/2ML IJ SOLN
INTRAMUSCULAR | Status: AC
  Filled 2017-05-21: qty 2

## 2017-05-21 MED ORDER — FENTANYL CITRATE (PF) 100 MCG/2ML IJ SOLN
25.0000 ug | INTRAMUSCULAR | Status: DC | PRN
  Administered 2017-05-21 (×2): 25 ug via INTRAVENOUS
  Administered 2017-05-21: 50 ug via INTRAVENOUS

## 2017-05-21 MED ORDER — SODIUM CHLORIDE 0.9% FLUSH: 3.0000 mL | Freq: Two times a day (BID) | INTRAVENOUS | Status: DC

## 2017-05-21 MED ORDER — PROPOFOL 500 MG/50ML IV EMUL
INTRAVENOUS | Status: AC
  Filled 2017-05-21: qty 50

## 2017-05-21 MED ORDER — GENTAMICIN SULFATE 40 MG/ML IJ SOLN
Freq: Once | INTRAMUSCULAR | Status: DC
  Filled 2017-05-21: qty 2

## 2017-05-21 MED ORDER — SODIUM CHLORIDE 0.9% FLUSH: 3.0000 mL | INTRAVENOUS | Status: DC | PRN

## 2017-05-21 MED ORDER — HEPARIN (PORCINE) IN NACL 2-0.9 UNIT/ML-% IJ SOLN
INTRAMUSCULAR | Status: AC
  Filled 2017-05-21: qty 500

## 2017-05-21 MED ORDER — LIDOCAINE HCL (CARDIAC) 20 MG/ML IV SOLN
INTRAVENOUS | Status: DC | PRN
  Administered 2017-05-21: 50 mg via INTRATRACHEAL

## 2017-05-21 MED ORDER — ACETAMINOPHEN 325 MG PO TABS: 650.0000 mg | ORAL_TABLET | ORAL | Status: DC | PRN

## 2017-05-21 MED ORDER — CEFAZOLIN SODIUM-DEXTROSE 2-4 GM/100ML-% IV SOLN
INTRAVENOUS | Status: AC
  Filled 2017-05-21: qty 100

## 2017-05-21 MED ORDER — OXYCODONE HCL 5 MG PO TABS: 5.0000 mg | ORAL_TABLET | ORAL | Status: DC | PRN

## 2017-05-21 MED ORDER — SODIUM CHLORIDE 0.9 % IV SOLN: 250.0000 mL | INTRAVENOUS | Status: DC | PRN

## 2017-05-21 MED ORDER — PROPOFOL 500 MG/50ML IV EMUL
INTRAVENOUS | Status: DC | PRN
  Administered 2017-05-21: 125 ug/kg/min via INTRAVENOUS

## 2017-05-21 MED ORDER — FENTANYL CITRATE (PF) 100 MCG/2ML IJ SOLN
INTRAMUSCULAR | Status: DC | PRN
  Administered 2017-05-21: 25 ug via INTRAVENOUS

## 2017-05-21 MED ORDER — HYDROMORPHONE HCL 1 MG/ML IJ SOLN: 1.0000 mg | Freq: Once | INTRAMUSCULAR | Status: DC | PRN

## 2017-05-21 MED ORDER — LIDOCAINE-EPINEPHRINE (PF) 1 %-1:200000 IJ SOLN
INTRAMUSCULAR | Status: AC
  Filled 2017-05-21: qty 30

## 2017-05-21 MED ORDER — SODIUM CHLORIDE 0.9 % IV SOLN
INTRAVENOUS | Status: DC
  Administered 2017-05-21: 08:00:00 via INTRAVENOUS

## 2017-05-21 MED ORDER — IOPAMIDOL (ISOVUE-300) INJECTION 61%
INTRAVENOUS | Status: DC | PRN
  Administered 2017-05-21: 35 mL via INTRAVENOUS

## 2017-05-21 MED ORDER — MIDAZOLAM HCL 2 MG/2ML IJ SOLN
INTRAMUSCULAR | Status: DC | PRN
  Administered 2017-05-21: 2 mg via INTRAVENOUS

## 2017-05-21 MED ORDER — PHENYLEPHRINE HCL 10 MG/ML IJ SOLN
INTRAMUSCULAR | Status: DC | PRN
  Administered 2017-05-21 (×3): 100 ug via INTRAVENOUS

## 2017-05-21 MED ORDER — ONDANSETRON HCL 4 MG/2ML IJ SOLN: 4.0000 mg | Freq: Four times a day (QID) | INTRAMUSCULAR | Status: DC | PRN

## 2017-05-21 MED ORDER — CEFAZOLIN SODIUM-DEXTROSE 2-4 GM/100ML-% IV SOLN
2.0000 g | Freq: Once | INTRAVENOUS | Status: AC
  Administered 2017-05-21: 2 g via INTRAVENOUS

## 2017-05-21 MED ORDER — PROMETHAZINE HCL 25 MG/ML IJ SOLN: 6.2500 mg | INTRAMUSCULAR | Status: DC | PRN

## 2017-05-21 MED ORDER — EPHEDRINE SULFATE 50 MG/ML IJ SOLN
INTRAMUSCULAR | Status: DC | PRN
  Administered 2017-05-21: 10 mg via INTRAVENOUS
  Administered 2017-05-21 (×2): 5 mg via INTRAVENOUS

## 2017-05-21 MED ORDER — FENTANYL CITRATE (PF) 100 MCG/2ML IJ SOLN
INTRAMUSCULAR | Status: AC
  Filled 2017-05-21: qty 2

## 2017-05-21 MED ORDER — SODIUM CHLORIDE 0.9 % IV SOLN
INTRAVENOUS | Status: AC
  Administered 2017-05-21: 12:00:00 via INTRAVENOUS

## 2017-05-21 MED ORDER — PROPOFOL 10 MG/ML IV BOLUS
INTRAVENOUS | Status: DC | PRN
  Administered 2017-05-21: 40 mg via INTRAVENOUS

## 2017-05-21 MED ORDER — HYDROMORPHONE HCL 1 MG/ML IJ SOLN: 0.5000 mg | INTRAMUSCULAR | Status: DC | PRN

## 2017-05-21 MED ORDER — LIDOCAINE HCL (PF) 2 % IJ SOLN
INTRAMUSCULAR | Status: AC
  Filled 2017-05-21: qty 10

## 2017-05-21 MED ORDER — HYDRALAZINE HCL 20 MG/ML IJ SOLN
5.0000 mg | INTRAMUSCULAR | Status: DC | PRN
Start: 2017-05-21 — End: 2017-05-21

## 2017-05-21 MED ORDER — LABETALOL HCL 5 MG/ML IV SOLN: 10.0000 mg | INTRAVENOUS | Status: DC | PRN

## 2017-05-21 MED ORDER — SODIUM CHLORIDE 0.9 % IV SOLN
INTRAVENOUS | Status: DC | PRN
  Administered 2017-05-21: 20 ug/min via INTRAVENOUS

## 2017-05-21 MED ORDER — PROPOFOL 10 MG/ML IV BOLUS
INTRAVENOUS | Status: AC
  Filled 2017-05-21: qty 20

## 2017-05-21 MED ORDER — SUCCINYLCHOLINE CHLORIDE 20 MG/ML IJ SOLN
INTRAMUSCULAR | Status: AC
  Filled 2017-05-21: qty 1

## 2017-05-21 SURGICAL SUPPLY — 26 items
BALLN DORADO 6X40X80 (BALLOONS) ×3
BALLN DORADO 8X40X80 (BALLOONS) ×3
BALLOON DORADO 6X40X80 (BALLOONS) IMPLANT
BALLOON DORADO 8X40X80 (BALLOONS) IMPLANT
CATH BEACON 5 .035 40 KMP TP (CATHETERS) IMPLANT
CATH BEACON 5 .035 65 KMP TIP (CATHETERS) ×2 IMPLANT
CATH BEACON 5 .038 40 KMP TP (CATHETERS) ×2
DEVICE PRESTO INFLATION (MISCELLANEOUS) ×2 IMPLANT
DEVICE TORQUE .025-.038 (MISCELLANEOUS) ×2 IMPLANT
DRAPE INCISE IOBAN 66X45 STRL (DRAPES) ×3 IMPLANT
GUIDEWIRE ANGLED .035 180CM (WIRE) ×2 IMPLANT
GUIDEWIRE SUPER STIFF .035X180 (WIRE) ×2 IMPLANT
KIT PORT POWER 8FR ISP CVUE (Port) ×2 IMPLANT
NDL ENTRY 21GA 7CM ECHOTIP (NEEDLE) IMPLANT
NEEDLE ENTRY 21GA 7CM ECHOTIP (NEEDLE) ×3 IMPLANT
PACK ANGIOGRAPHY (CUSTOM PROCEDURE TRAY) ×3 IMPLANT
SET INTRO CAPELLA COAXIAL (SET/KITS/TRAYS/PACK) ×2 IMPLANT
SHEATH BRITE TIP 5FRX11 (SHEATH) ×2 IMPLANT
SHEATH BRITE TIP 6FRX11 (SHEATH) ×2 IMPLANT
SHEATH BRITE TIP 6FRX5.5 (SHEATH) ×2 IMPLANT
SPONGE XRAY 4X4 16PLY STRL (MISCELLANEOUS) ×4 IMPLANT
SUT MNCRL AB 4-0 PS2 18 (SUTURE) ×3 IMPLANT
SUT PROLENE 0 CT 1 30 (SUTURE) ×3 IMPLANT
SUT VIC AB 3-0 SH 27 (SUTURE) ×3
SUT VIC AB 3-0 SH 27X BRD (SUTURE) IMPLANT
SUTURE VIC 3-0 (SUTURE) ×3 IMPLANT

## 2017-05-21 NOTE — Anesthesia Preprocedure Evaluation (Addendum)
Anesthesia Evaluation  Patient identified by MRN, date of birth, ID band Patient awake    Reviewed: Allergy & Precautions, H&P , NPO status , reviewed documented beta blocker date and time   Airway Mallampati: III       Dental  (+) Poor Dentition No loose:   Pulmonary    Pulmonary exam normal        Cardiovascular Normal cardiovascular exam     Neuro/Psych PSYCHIATRIC DISORDERS Depression  Neuromuscular disease    GI/Hepatic neg GERD  ,  Endo/Other  Hypothyroidism   Renal/GU Renal disease     Musculoskeletal  (+) Arthritis , Fibromyalgia -  Abdominal   Peds  Hematology  (+) anemia ,   Anesthesia Other Findings  Abdominal pain  Hypothyroidism  Colitis, infectious  AKI (acute kidney injury) ([place])  Transaminitis  Infectious colitis  Acute on chronic renal failure (HCC)  Chronic abdominal and back  pain  Nausea and vomiting  Porphyria (HCC)  Rheumatoid arthritis (HCC)  UTI (lower urinary tract infection)  Uncontrolled pain  Rheu arthrit of right ank/ft w involv of organs and systems ([place])  DDD (degenerative disc disease), cervical  DDD (degenerative disc disease), thoracic  DDD (degenerative disc disease), lumbosacral  Symptomatic anemia  Acute hyponatremia  Hematemesis  Chronic hoarseness  Unintended weight loss  Iron deficiency anemia  Chronic lower back pain  Diarrhea  Depressive disorder  Primary localized osteoarthrosis of hand  Other specified abnormal immunological findings in serum  Noninfectious gastroenteritis and colitis  Mixed urge and stress incontinence  Lightheadedness  Hypokalemia due to inadequate potassium intake  Hyperlipidemia  H/O medication noncompliance  Cyclic vomiting syndrome  Disoriented to time  Loss of weight  Abdominal pain, generalized  Gastritis without bleeding  Mechanical complication of vascular device   Reproductive/Obstetrics                            Anesthesia Physical Anesthesia Plan  ASA: III  Anesthesia Plan: General   Post-op Pain Management:    Induction:   PONV Risk Score and Plan: Propofol infusion  Airway Management Planned:   Additional Equipment:   Intra-op Plan:   Post-operative Plan:   Informed Consent: I have reviewed the patients History and Physical, chart, labs and discussed the procedure including the risks, benefits and alternatives for the proposed anesthesia with the patient or authorized representative who has indicated his/her understanding and acceptance.   Dental Advisory Given  Plan Discussed with:   Anesthesia Plan Comments:         Anesthesia Quick Evaluation

## 2017-05-21 NOTE — Anesthesia Post-op Follow-up Note (Signed)
Anesthesia QCDR form completed.        

## 2017-05-21 NOTE — Discharge Instructions (Signed)
Implanted Port Insertion, Care After °This sheet gives you information about how to care for yourself after your procedure. Your health care provider may also give you more specific instructions. If you have problems or questions, contact your health care provider. °What can I expect after the procedure? °After your procedure, it is common to have: °· Discomfort at the port insertion site. °· Bruising on the skin over the port. This should improve over 3-4 days. ° °Follow these instructions at home: °Port care °· After your port is placed, you will get a manufacturer's information card. The card has information about your port. Keep this card with you at all times. °· Take care of the port as told by your health care provider. Ask your health care provider if you or a family member can get training for taking care of the port at home. A home health care nurse may also take care of the port. °· Make sure to remember what type of port you have. °Incision care °· Follow instructions from your health care provider about how to take care of your port insertion site. Make sure you: °? Wash your hands with soap and water before you change your bandage (dressing). If soap and water are not available, use hand sanitizer. °? Change your dressing as told by your health care provider. °? Leave stitches (sutures), skin glue, or adhesive strips in place. These skin closures may need to stay in place for 2 weeks or longer. If adhesive strip edges start to loosen and curl up, you may trim the loose edges. Do not remove adhesive strips completely unless your health care provider tells you to do that. °· Check your port insertion site every day for signs of infection. Check for: °? More redness, swelling, or pain. °? More fluid or blood. °? Warmth. °? Pus or a bad smell. °General instructions °· Do not take baths, swim, or use a hot tub until your health care provider approves. °· Do not lift anything that is heavier than 10 lb (4.5  kg) for a week, or as told by your health care provider. °· Ask your health care provider when it is okay to: °? Return to work or school. °? Resume usual physical activities or sports. °· Do not drive for 24 hours if you were given a medicine to help you relax (sedative). °· Take over-the-counter and prescription medicines only as told by your health care provider. °· Wear a medical alert bracelet in case of an emergency. This will tell any health care providers that you have a port. °· Keep all follow-up visits as told by your health care provider. This is important. °Contact a health care provider if: °· You cannot flush your port with saline as directed, or you cannot draw blood from the port. °· You have a fever or chills. °· You have more redness, swelling, or pain around your port insertion site. °· You have more fluid or blood coming from your port insertion site. °· Your port insertion site feels warm to the touch. °· You have pus or a bad smell coming from the port insertion site. °Get help right away if: °· You have chest pain or shortness of breath. °· You have bleeding from your port that you cannot control. °Summary °· Take care of the port as told by your health care provider. °· Change your dressing as told by your health care provider. °· Keep all follow-up visits as told by your health care provider. °  This information is not intended to replace advice given to you by your health care provider. Make sure you discuss any questions you have with your health care provider. °Document Released: 11/19/2012 Document Revised: 12/21/2015 Document Reviewed: 12/21/2015 °Elsevier Interactive Patient Education © 2017 [employer] °Moderate Conscious Sedation, Adult, Care After °These instructions provide you with information about caring for yourself after your procedure. Your health care provider may also give you more specific instructions. Your treatment has been planned according to current medical  practices, but problems sometimes occur. Call your health care provider if you have any problems or questions after your procedure. °What can I expect after the procedure? °After your procedure, it is common: °· To feel sleepy for several hours. °· To feel clumsy and have poor balance for several hours. °· To have poor judgment for several hours. °· To vomit if you eat too soon. ° °Follow these instructions at home: °For at least 24 hours after the procedure: ° °· Do not: °? Participate in activities where you could fall or become injured. °? Drive. °? Use heavy machinery. °? Drink alcohol. °? Take sleeping pills or medicines that cause drowsiness. °? Make important decisions or sign legal documents. °? Take care of children on your own. °· Rest. °Eating and drinking °· Follow the diet recommended by your health care provider. °· If you vomit: °? Drink water, juice, or soup when you can drink without vomiting. °? Make sure you have little or no nausea before eating solid foods. °General instructions °· Have a responsible adult stay with you until you are awake and alert. °· Take over-the-counter and prescription medicines only as told by your health care provider. °· If you smoke, do not smoke without supervision. °· Keep all follow-up visits as told by your health care provider. This is important. °Contact a health care provider if: °· You keep feeling nauseous or you keep vomiting. °· You feel light-headed. °· You develop a rash. °· You have a fever. °Get help right away if: °· You have trouble breathing. °This information is not intended to replace advice given to you by your health care provider. Make sure you discuss any questions you have with your health care provider. °Document Released: 11/19/2012 Document Revised: 07/04/2015 Document Reviewed: 05/21/2015 °Elsevier Interactive Patient Education © 2018 [employer] ° °

## 2017-05-21 NOTE — Transfer of Care (Signed)
Immediate Anesthesia Transfer of Care Note  Patient: Adrienne Flores  Procedure(s) Performed: [name] CATH INSERTION (Right )  Patient Location: PACU  Anesthesia Type:General  Level of Consciousness: sedated  Airway & Oxygen Therapy: Patient Spontanous Breathing and Patient connected to face mask oxygen  Post-op Assessment: Report given to RN and Post -op Vital signs reviewed and stable  Post vital signs: Reviewed and stable  Last Vitals:  Vitals Value Taken Time  BP 98/69 05/21/2017 10:49 AM  Temp    Pulse 63 05/21/2017 10:49 AM  Resp 14 05/21/2017 10:49 AM  SpO2 100 % 05/21/2017 10:49 AM    Last Pain:  Vitals:   05/21/17 0800  TempSrc: Axillary  PainSc: 0-No pain         Complications: No apparent anesthesia complications

## 2017-05-21 NOTE — H&P (Signed)
[facility] History & Physical Update  The patient was interviewed and re-examined.  The patient's previous History and Physical has been reviewed and is unchanged.  There is no change in the plan of care. We plan to proceed with the scheduled procedure.  [name], MD  05/21/2017, 8:29 AM

## 2017-05-21 NOTE — Op Note (Signed)
OPERATIVE NOTE   PROCEDURE: 1. Introduction catheter into superior vena cava right femoral venous approach 2. Percutaneous transluminal angioplasty to 8 mm right innominate vein 3. 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  ANESTHESIA: Conscious sedation was administered under my direct supervision by the interventional radiology RN. IV Versed plus fentanyl were utilized. Continuous ECG, pulse oximetry and blood pressure was monitored throughout the entire procedure. Conscious sedation was for a total of 73 minutes.  ESTIMATED BLOOD LOSS: Minimal   FINDING(S): 1.  Occlusion of the right innominate vein  SPECIMEN(S): None  INDICATIONS:   Adrienne Flores is a 52 y.o. female who presents with the need for IV access.  Her previous Infuse-a-Port of been present for many years but had migrated backward and was no longer working.  At that time of evaluation she was found to have superior vena cava syndrome with occlusions of both right and left innominate veins.  She is now being returned to specials under general anesthesia with the hope of finding a way through the occlusion so that a new port can be placed.  The risks and benefits of been reviewed.  Her previous images from the last procedure were also reviewed with her.  All questions have been answered.  She agrees with this plan..  DESCRIPTION: After obtaining full informed written consent, the patient was brought back to the special procedure suite and placed in the supine position. The patient's right neck and chest wall as well as the right groin are prepped  and draped in sterile fashion. Appropriate timeout was called.  Ultrasound is placed in a sterile sleeve, ultrasound is utilized to avoid vascular injury as well as secondary to lack of appropriate landmarks. The right internal jugular vein is identified. It is echolucent and homogeneous as well as easily compressible indicating patency. An image is recorded for the permanent record.  Access to the vein with a micropuncture needle is done under direct ultrasound visualization.  1% lidocaine is infiltrated into the soft tissue at the base of the neck as well as on the chest wall.  Under direct ultrasound visualization a micro-needle is inserted into the vein followed by the micro-wire but this curls back on itself just below the level of the clavicular head. Micro-sheath was then advanced and a J wire is inserted until it to stops at the occlusion but this allows enough purchase for a 6 French sheath to be placed.  Hand-injection of contrast through the 6 French sheath in multiple views demonstrates a large number of collaterals with faint filling of the inferior vena cava in the background.  Approximately 20 minutes was spent using a Kumpe catheter and a Glidewire in an attempt to negotiate the wire and catheter into the proximal superior vena cava and atrium this however was unsuccessful.  Attention was then turned to the right groin where the ultrasound was used to evaluate the femoral vein.  Femoral vein is echolucent and compressible indicating patency.  Images recorded for the permanent record.  1% lidocaine is infiltrated in soft tissues and access is obtained with a micro needle into the common femoral vein on the right.  Microwire was advanced without difficulty under  fluoroscopic guidance followed by a micro-sheath.  J-wire is advanced followed by a 5 [place] sheath.  Kumpe catheter and Glidewire were then negotiated up to the atrium and then through the atrium and into the superior vena cava.   Hand-injection of contrast demonstrates occlusion of the innominate vein.  Hand-injection of contrast through the 6 French sheath in the jugular vein highlights this area.  The glide wire is then negotiated across the occlusion and into the confluence of the right subclavian and right jugular vein.  At this point I am able to negotiate the wire directly into the existing 6 French sheath and ultimately the wire was then pulled extracorporeally.  Once enough wire has been pulled out the jugular access site a new 6 French sheath is inserted and subsequently the 40 cm Kumpe catheter was advanced over the wire.  Kumpe is now advanced down so that its tip is in the mid inferior vena cava.  Glidewire was removed as is the femoral Kumpe catheter.  A Amplatz Super Stiff wire was then advanced through the 40 cm Kumpe.  The 6 French sheath was repositioned and initially a 6 mm x 40 mm [place] balloon is inflated across this occlusion inflations to 25 atm for approximately 1 minute subsequently an 8 mm x 40 mm [place] balloon is advanced across the innominate stricture and inflated to 16 atm for 1 minute.  Follow-up imaging demonstrates marked improvement that will now allow easy passage of the catheter.  Balloon is removed and the 6 French sheath was removed.  The peel-away sheath is advanced over the Amplatz wire and then the Infuse-a-Port catheter is advanced over the Amplatz wire.  Amplatz wires removed and the catheter was flushed with heparinized saline.  A small counterincision was created at the wire insertion site. A transverse incision is created 3 fingerbreadths below the clavicle and a pocket is fashioned using both blunt and sharp dissection. The pocket is tested for appropriate size with the hub of the Infuse-a-Port.  Peel-away sheath was then removed.  The tunneling device is then used to pull the intravascular portion of the catheter from the pocket to the neck counterincision.  Catheter is then positioned  under fluoroscopic guidance at the atrial caval junction. It is then transected connected to the hub and the hope is slipped into the subcutaneous pocket on the chest wall. The hub was then accessed percutaneously and aspirates easily and flushes well and is flushed with 30 cc of heparinized saline. The pocket incision is then closed in layers using interrupted 3-0 Vicryl for the subcutaneous tissues and 4-0 Monocryl subcuticular for skin closure. Dermabond is applied. The neck counterincision was closed with 4-0 Monocryl subcuticular and Dermabond as well.  The patient tolerated the procedure well and there were no immediate complications.  COMPLICATIONS: None  CONDITION: Unchanged  Katha Cabal M.D. [facility] Office: [phone]   05/21/2017, 10:48 AM

## 2017-05-21 NOTE — Anesthesia Postprocedure Evaluation (Signed)
Anesthesia Post Note  Patient: Adrienne Flores  Procedure(s) Performed: PORTA CATH INSERTION (Right )  Patient location during evaluation: PACU Anesthesia Type: General Level of consciousness: awake and alert Pain management: pain level controlled Vital Signs Assessment: post-procedure vital signs reviewed and stable Respiratory status: spontaneous breathing, nonlabored ventilation and respiratory function stable Cardiovascular status: blood pressure returned to baseline and stable Postop Assessment: no apparent nausea or vomiting Anesthetic complications: no     Last Vitals:  Vitals:   05/21/17 1155 05/21/17 1200  BP: (!) 129/93 128/76  Pulse: 76 82  Resp: 12 14  Temp:    SpO2: 98% 98%    Last Pain:  Vitals:   05/21/17 1155  TempSrc:   PainSc: 0-No pain                 Alphonsus Sias

## 2017-05-21 NOTE — Anesthesia Procedure Notes (Signed)
Performed by: Lance Muss, CRNA Pre-anesthesia Checklist: Patient identified, Emergency Drugs available, Suction available, Patient being monitored and Timeout performed Patient Re-evaluated:Patient Re-evaluated prior to induction Oxygen Delivery Method: Simple face mask Induction Type: IV induction Ventilation: Nasal airway inserted- appropriate to patient size

## 2017-05-22 ENCOUNTER — Encounter: Payer: Self-pay | Admitting: Vascular Surgery

## 2017-05-23 ENCOUNTER — Other Ambulatory Visit: Payer: Self-pay | Admitting: Oncology

## 2017-05-23 ENCOUNTER — Telehealth: Payer: Self-pay | Admitting: *Deleted

## 2017-05-23 NOTE — Telephone Encounter (Signed)
-----   Message from Sindy [place], MD sent at 05/13/2017 12:30 PM EDT ----- Iron studies are low. We could give her 2 doses of feraheme if she is agreeable. But she does not have port at this time and that might have to be looked into first. Thanks, [place]

## 2017-05-23 NOTE — Telephone Encounter (Signed)
No answer when I called pt. Voicemail and there is no voicemail set up. I then called her husband and left message on his voicemail that his wife's ferritin level was low and she would benefit by having 2 doses feraheme ( iron) in [place] but we needed to wait til she got her portacath replaced. I saw that she had it and now we can get her started if ok with pt. While I was typing this husband called and said that she is agreeable and they accepted am appt 10 am on 4/15 and repeat dose 4/22 10 am in [place].

## 2017-05-27 ENCOUNTER — Inpatient Hospital Stay: Payer: Medicare Other

## 2017-05-27 VITALS — BP 112/76 | HR 71 | Temp 94.2°F | Resp 16

## 2017-05-27 DIAGNOSIS — D509 Iron deficiency anemia, unspecified: Secondary | ICD-10-CM | POA: Diagnosis not present

## 2017-05-27 MED ORDER — SODIUM CHLORIDE 0.9 % IV SOLN
Freq: Once | INTRAVENOUS | Status: AC
  Administered 2017-05-27: 11:00:00 via INTRAVENOUS
  Filled 2017-05-27: qty 1000

## 2017-05-27 MED ORDER — HEPARIN SOD (PORK) LOCK FLUSH 100 UNIT/ML IV SOLN
500.0000 [IU] | Freq: Once | INTRAVENOUS | Status: AC
  Administered 2017-05-27: 500 [IU] via INTRAVENOUS
  Filled 2017-05-27: qty 5

## 2017-05-27 MED ORDER — SODIUM CHLORIDE 0.9 % IV SOLN
510.0000 mg | Freq: Once | INTRAVENOUS | Status: AC
  Administered 2017-05-27: 510 mg via INTRAVENOUS
  Filled 2017-05-27: qty 17

## 2017-05-27 MED ORDER — FERUMOXYTOL INJECTION 510 MG/17 ML
INTRAVENOUS | Status: AC
  Filled 2017-05-27: qty 17

## 2017-05-27 MED ORDER — SODIUM CHLORIDE 0.9% FLUSH
10.0000 mL | INTRAVENOUS | Status: DC | PRN
  Administered 2017-05-27: 10 mL via INTRAVENOUS
  Filled 2017-05-27: qty 10

## 2017-05-27 NOTE — Patient Instructions (Signed)

## 2017-06-03 ENCOUNTER — Ambulatory Visit
Admission: EM | Admit: 2017-06-03 | Discharge: 2017-06-03 | Disposition: A | Discharge status: Home / Self Care | Payer: Medicare Other | Attending: Emergency Medicine | Admitting: Emergency Medicine

## 2017-06-03 ENCOUNTER — Other Ambulatory Visit: Payer: Self-pay

## 2017-06-03 ENCOUNTER — Inpatient Hospital Stay: Payer: Medicare Other

## 2017-06-03 VITALS — BP 90/64 | HR 65 | Temp 97.8°F | Resp 17

## 2017-06-03 DIAGNOSIS — R1030 Lower abdominal pain, unspecified: Secondary | ICD-10-CM

## 2017-06-03 DIAGNOSIS — H66001 Acute suppurative otitis media without spontaneous rupture of ear drum, right ear: Secondary | ICD-10-CM

## 2017-06-03 DIAGNOSIS — D509 Iron deficiency anemia, unspecified: Secondary | ICD-10-CM | POA: Diagnosis not present

## 2017-06-03 MED ORDER — AMOXICILLIN-POT CLAVULANATE 875-125 MG PO TABS: 1.0000 | ORAL_TABLET | Freq: Two times a day (BID) | ORAL | 0 refills | Status: DC

## 2017-06-03 MED ORDER — SODIUM CHLORIDE 0.9 % IV SOLN
Freq: Once | INTRAVENOUS | Status: AC
  Administered 2017-06-03: 10:00:00 via INTRAVENOUS
  Filled 2017-06-03: qty 1000

## 2017-06-03 MED ORDER — SODIUM CHLORIDE 0.9% FLUSH
10.0000 mL | INTRAVENOUS | Status: DC | PRN
Start: 2017-06-03 — End: 2017-06-03
  Administered 2017-06-03: 10 mL
  Filled 2017-06-03: qty 10

## 2017-06-03 MED ORDER — FERUMOXYTOL INJECTION 510 MG/17 ML
510.0000 mg | Freq: Once | INTRAVENOUS | Status: AC
  Administered 2017-06-03: 510 mg via INTRAVENOUS
  Filled 2017-06-03: qty 17

## 2017-06-03 MED ORDER — HEPARIN SOD (PORK) LOCK FLUSH 100 UNIT/ML IV SOLN
500.0000 [IU] | Freq: Once | INTRAVENOUS | Status: AC | PRN
  Administered 2017-06-03: 500 [IU]
  Filled 2017-06-03: qty 5

## 2017-06-03 MED ORDER — FERUMOXYTOL INJECTION 510 MG/17 ML
INTRAVENOUS | Status: AC
  Filled 2017-06-03: qty 17

## 2017-06-03 NOTE — ED Triage Notes (Signed)
Pt reports "stomach issues" for years. Sx became worse yesterday. Abdominal pain just below umbilicus and radiates all the way across back. Being seen at pain clinic tomorrow. No n/v/d. Pain 9/10.

## 2017-06-03 NOTE — Discharge Instructions (Addendum)
Do not have anything to eat or drink until your ER evaluation is complete.  Make sure he finished the antibiotics for your ear infection.

## 2017-06-03 NOTE — ED Provider Notes (Signed)
HPI  SUBJECTIVE:  Adrienne Flores is a 52 y.o. female who presents with sharp, constant, ripping, knifelike periumbilical pain starting yesterday after eating.  She states that she has had similar pain before, but states that she cannot handle the pain today.  States it radiates into her back.  No nausea, vomiting, fevers, abdominal distention.  She reports anorexia but states that this is not new or different.  She denies urinary urgency, frequency, cloudy odorous urine, hematuria.  No vaginal bleeding, discharge.  States that she had a normal bowel movement this morning.  Denies melena, hematochezia.  States that the car ride over here was painful.  She tried Excedrin without improvement in her symptoms.  Symptoms are worse with walking, eating.  It is not associated with urination, defecation.  She has an extensive past medical history status post cholecystectomy, hysterectomy, C-section.  Also rheumatoid arthritis, porphyria, hypothyroidism.  Chart review states that she has a DVT of her lower extremity but patient denies this.  She is currently not on any anticoagulants or antiplatelets.  No history of lupus, atrial fibrillation, mesenteric ischemia, hypercoagulability, pancreatitis, alcohol use, chronic NSAID use.   She also reports right ear pain and decreased hearing starting last night.  No otorrhea.  No recent URI symptoms.  PMD:.Grandis, Ishmael Holter, MD   Past Medical History:  Diagnosis Date  . Anemia   . At high risk for falls   . Chronic abdominal pain 2008   dates back to at least 2008.  multiple ultrasound, CT scans, x rays at [facility], [place].  [facility] colonoscopy, egd unrevealing.    . Colitis 2015   ? colitis per CTs in 2015, 2016 and 11/2015.  presumed infectious.  colonoscopy in 2015 negative for colitis.   . Constipation   . Cyclic vomiting syndrome 2015   per opinion  of Dr Granville Lewis, IBS specialist at [facility].   . Degenerative disc disease   . Depression   . Fibromyalgia    . Gastroparesis 03/2011   markedly abnormal gastric emptying study.   . Hematemesis 11/2015   mild to moderate blood in emesis appeared after 2 plus weeks of frequent, non-bloody emesis.   [name] Hemorrhoids   . Hemorrhoids   . History of chicken pox   . Hypokalemia   . Hypothyroid   . Lupus ([place])   . 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)   . Rheumatoid arthritis [facility])     Past Surgical History:  Procedure Laterality Date  . ABDOMINAL HYSTERECTOMY     total  . CESAREAN SECTION     x2  . CHOLECYSTECTOMY    . COLONOSCOPY  11/2013   at [facility] Dr Kennith Gain. Hemorrhoids. sigmoid tics. random biopsies negative for microscopic colitis or any pathology.   . COLONOSCOPY WITH PROPOFOL N/A 05/10/2017   Procedure: COLONOSCOPY WITH PROPOFOL;  Surgeon: Lucilla Lame, MD;  Location: [place];  Service: Endoscopy;  Laterality: N/A;  . ESOPHAGOGASTRODUODENOSCOPY  11/2013   Dr Kennith Gain at [facility].  Normal study.  Duodenal bx negative for villous atrophy.   . ESOPHAGOGASTRODUODENOSCOPY N/A 12/09/2015   Procedure: ESOPHAGOGASTRODUODENOSCOPY (EGD);  Surgeon: Doran Stabler, MD;  Location: [facility] ENDOSCOPY;  Service: Endoscopy;  Laterality: N/A;  . ESOPHAGOGASTRODUODENOSCOPY (EGD) WITH PROPOFOL N/A 05/10/2017   Procedure: ESOPHAGOGASTRODUODENOSCOPY (EGD) WITH PROPOFOL;  Surgeon: Lucilla Lame, MD;  Location: [place];  Service: Endoscopy;  Laterality: N/A;  . KNEE SURGERY Left   . OOPHORECTOMY    . PORTA CATH INSERTION N/A 04/18/2017  Procedure: PORTA CATH INSERTION;  Surgeon: Katha Cabal, MD;  Location: [place] CV LAB;  Service: Cardiovascular;  Laterality: N/A;  . PORTA CATH INSERTION Right 05/21/2017   Procedure: PORTA CATH INSERTION;  Surgeon: Katha Cabal, MD;  Location: [place] CV LAB;  Service: Cardiovascular;  Laterality: Right;  . REPLACEMENT TOTAL KNEE Bilateral     Family History  Problem Relation Age of Onset  . Leukemia Mother   . Arthritis Mother   . Early death Mother   .  Miscarriages / [place] Mother   . Hypothyroidism Sister   . Cancer Paternal Grandmother   . Cancer Paternal Grandfather   . Hypothyroidism Brother     Social History   Tobacco Use  . Smoking status: Never Smoker  . Smokeless tobacco: Never Used  Substance Use Topics  . Alcohol use: No  . Drug use: No    No current facility-administered medications for this encounter.   Current Outpatient Medications:  .  DULoxetine (CYMBALTA) 20 MG capsule, Take by mouth., Disp: , Rfl:  .  gabapentin (NEURONTIN) 600 MG tablet, Take by mouth., Disp: , Rfl:  .  amoxicillin-clavulanate (AUGMENTIN) 875-125 MG tablet, Take 1 tablet by mouth 2 (two) times daily for 7 days., Disp: 14 tablet, Rfl: 0 .  Carboxymethylcellul-Glycerin (LUBRICATING EYE DROPS OP), Place 1 drop into both eyes daily as needed (allergies)., Disp: , Rfl:  .  diphenhydrAMINE-APAP, sleep, (EXCEDRIN PM) 38-500 MG TABS, Take 1 tablet by mouth at bedtime as needed (sleep)., Disp: , Rfl:  .  diphenoxylate-atropine (LOMOTIL) 2.5-0.025 MG tablet, Take 1 tablet by mouth 4 (four) times daily as needed for diarrhea or loose stools. (Patient not taking: Reported on 04/16/2017), Disp: 30 tablet, Rfl: 1 .  HYDROcodone-acetaminophen (NORCO) 5-325 MG tablet, Take 1 tablet by mouth every 6 (six) hours as needed for up to 7 doses for severe pain. (Patient not taking: Reported on 04/16/2017), Disp: 7 tablet, Rfl: 0 .  levothyroxine (SYNTHROID, LEVOTHROID) 137 MCG tablet, Take 137 mcg by mouth at bedtime. , Disp: , Rfl:  .  naproxen sodium (ALEVE) 220 MG tablet, Take 220 mg by mouth 2 (two) times daily as needed (pain)., Disp: , Rfl:  .  ondansetron (ZOFRAN ODT) 4 MG disintegrating tablet, Take 1 tablet (4 mg total) by mouth every 8 (eight) hours as needed. (Patient not taking: Reported on 05/13/2017), Disp: 10 tablet, Rfl: 0 .  traMADol (ULTRAM) 50 MG tablet, Take 1 tablet (50 mg total) by mouth every 6 (six) hours as needed. (Patient not taking: Reported on  05/13/2017), Disp: 10 tablet, Rfl: 0  Allergies  Allergen Reactions  . Morphine And Related Hives and Itching    ONLY MORPHINE, tolerates dilaudid, oxycodone, and norco ONLY MORPHINE, tolerates dilaudid, oxycodone, and norco  . Methotrexate Derivatives Other (See Comments)    Headaches   . Relafen [Nabumetone] Other (See Comments)    Headaches  . Haloperidol Nausea Only and Other (See Comments)    Patient stated that she broke out in cold sweats      ROS  As noted in HPI.   Physical Exam  BP (!) 110/95 (BP Location: Left Arm)   Pulse 73   Temp 97.7 F (36.5 C) (Oral)   Resp 18   Ht 5\' 1"  (1.549 m)   Wt 161 lb (73 kg)   SpO2 100%   BMI 30.42 kg/m    Constitutional: Well developed, well nourished, appears uncomfortable. Eyes:  EOMI, conjunctiva normal bilaterally HENT: Normocephalic,  atraumatic,mucus membranes moist.  Left TM erythematous, dull, bulging.  Left TM normal. Respiratory: Normal inspiratory effort, lungs clear bilaterally cardiovascular: Normal rate regular rhythm, no murmurs, rubs, gallops GI: Positive vertical C-section scar.  Diffuse lower abdominal tenderness.  Nondistended.  No guarding.  Patient reports rebound.  No rigidity.  Active bowel sounds.  Negative McBurney. Back: No CVAT. skin: No rash, skin intact Musculoskeletal: no deformities Neurologic: Alert & oriented x 3, no focal neuro deficits Psychiatric: Speech and behavior appropriate   ED Course   Medications - No data to display  No orders of the defined types were placed in this encounter.   No results found for this or any previous visit (from the past 24 hour(s)). No results found.  ED Clinical Impression  Lower abdominal pain  Non-recurrent acute suppurative otitis media of right ear without spontaneous rupture of tympanic membrane   ED Assessment/Plan  [place] Narcotic database reviewed for this patient.  She had a 15-day supply of tramadol #90 prescribed on 04/17/2017.  ER  records reviewed.  Patient was seen in the ER on 2/23 and 2/24 where she had an abdominal CT that showed colitis.  She has had 4 abdominal CTs since 2017.  While patient says that this is similar to previous episodes of abdominal pain, concern for more acute process. Appears ill.  We also do not have narcotics here to help control the patient's pain.  Transferring to the ED for comprehensive workup.  She also has a right-sided otitis media.  Will send home with Augmentin..   Discussed  MDM, treatment plan, and plan for follow-up with patient. Discussed sn/sx that should prompt return to the ED. patient agrees with plan.   Meds ordered this encounter  Medications  . amoxicillin-clavulanate (AUGMENTIN) 875-125 MG tablet    Sig: Take 1 tablet by mouth 2 (two) times daily for 7 days.    Dispense:  14 tablet    Refill:  0    *This clinic note was created using [employer]. Therefore, there may be occasional mistakes despite careful proofreading.   ?   Melynda Ripple, MD 06/03/17 1236

## 2017-06-08 ENCOUNTER — Other Ambulatory Visit: Payer: Self-pay

## 2017-06-08 ENCOUNTER — Emergency Department: Payer: Medicare Other

## 2017-06-08 ENCOUNTER — Inpatient Hospital Stay
Admission: EM | Admit: 2017-06-08 | Discharge: 2017-06-12 | DRG: 315 | Disposition: A | Discharge status: Home / Self Care | Payer: Medicare Other | Attending: Internal Medicine | Admitting: Internal Medicine

## 2017-06-08 ENCOUNTER — Encounter: Payer: Self-pay | Admitting: Emergency Medicine

## 2017-06-08 DIAGNOSIS — Z79890 Hormone replacement therapy (postmenopausal): Secondary | ICD-10-CM

## 2017-06-08 DIAGNOSIS — K589 Irritable bowel syndrome without diarrhea: Secondary | ICD-10-CM | POA: Diagnosis present

## 2017-06-08 DIAGNOSIS — H6690 Otitis media, unspecified, unspecified ear: Secondary | ICD-10-CM

## 2017-06-08 DIAGNOSIS — E039 Hypothyroidism, unspecified: Secondary | ICD-10-CM | POA: Diagnosis present

## 2017-06-08 DIAGNOSIS — Z8261 Family history of arthritis: Secondary | ICD-10-CM

## 2017-06-08 DIAGNOSIS — G8929 Other chronic pain: Secondary | ICD-10-CM | POA: Diagnosis present

## 2017-06-08 DIAGNOSIS — M2669 Other specified disorders of temporomandibular joint: Secondary | ICD-10-CM | POA: Diagnosis present

## 2017-06-08 DIAGNOSIS — Z806 Family history of leukemia: Secondary | ICD-10-CM

## 2017-06-08 DIAGNOSIS — Z96653 Presence of artificial knee joint, bilateral: Secondary | ICD-10-CM | POA: Diagnosis present

## 2017-06-08 DIAGNOSIS — I9589 Other hypotension: Secondary | ICD-10-CM | POA: Diagnosis not present

## 2017-06-08 DIAGNOSIS — M797 Fibromyalgia: Secondary | ICD-10-CM | POA: Diagnosis present

## 2017-06-08 DIAGNOSIS — E876 Hypokalemia: Secondary | ICD-10-CM | POA: Diagnosis not present

## 2017-06-08 DIAGNOSIS — I959 Hypotension, unspecified: Principal | ICD-10-CM | POA: Diagnosis present

## 2017-06-08 DIAGNOSIS — R001 Bradycardia, unspecified: Secondary | ICD-10-CM

## 2017-06-08 DIAGNOSIS — Z791 Long term (current) use of non-steroidal anti-inflammatories (NSAID): Secondary | ICD-10-CM

## 2017-06-08 DIAGNOSIS — Z90710 Acquired absence of both cervix and uterus: Secondary | ICD-10-CM

## 2017-06-08 DIAGNOSIS — H6982 Other specified disorders of Eustachian tube, left ear: Secondary | ICD-10-CM | POA: Diagnosis present

## 2017-06-08 DIAGNOSIS — H9192 Unspecified hearing loss, left ear: Secondary | ICD-10-CM | POA: Diagnosis present

## 2017-06-08 DIAGNOSIS — Z885 Allergy status to narcotic agent status: Secondary | ICD-10-CM

## 2017-06-08 DIAGNOSIS — Z9049 Acquired absence of other specified parts of digestive tract: Secondary | ICD-10-CM

## 2017-06-08 DIAGNOSIS — E861 Hypovolemia: Secondary | ICD-10-CM

## 2017-06-08 DIAGNOSIS — E2740 Unspecified adrenocortical insufficiency: Secondary | ICD-10-CM | POA: Diagnosis present

## 2017-06-08 DIAGNOSIS — R590 Localized enlarged lymph nodes: Secondary | ICD-10-CM | POA: Diagnosis present

## 2017-06-08 DIAGNOSIS — D638 Anemia in other chronic diseases classified elsewhere: Secondary | ICD-10-CM | POA: Diagnosis present

## 2017-06-08 DIAGNOSIS — D649 Anemia, unspecified: Secondary | ICD-10-CM | POA: Diagnosis present

## 2017-06-08 DIAGNOSIS — E538 Deficiency of other specified B group vitamins: Secondary | ICD-10-CM | POA: Diagnosis present

## 2017-06-08 DIAGNOSIS — R634 Abnormal weight loss: Secondary | ICD-10-CM | POA: Diagnosis present

## 2017-06-08 DIAGNOSIS — E875 Hyperkalemia: Secondary | ICD-10-CM | POA: Diagnosis present

## 2017-06-08 DIAGNOSIS — H6692 Otitis media, unspecified, left ear: Secondary | ICD-10-CM | POA: Diagnosis present

## 2017-06-08 DIAGNOSIS — Z888 Allergy status to other drugs, medicaments and biological substances status: Secondary | ICD-10-CM

## 2017-06-08 DIAGNOSIS — Z9181 History of falling: Secondary | ICD-10-CM

## 2017-06-08 DIAGNOSIS — Z9119 Patient's noncompliance with other medical treatment and regimen: Secondary | ICD-10-CM

## 2017-06-08 DIAGNOSIS — K529 Noninfective gastroenteritis and colitis, unspecified: Secondary | ICD-10-CM | POA: Diagnosis present

## 2017-06-08 DIAGNOSIS — Z7901 Long term (current) use of anticoagulants: Secondary | ICD-10-CM

## 2017-06-08 DIAGNOSIS — Z79899 Other long term (current) drug therapy: Secondary | ICD-10-CM

## 2017-06-08 DIAGNOSIS — M069 Rheumatoid arthritis, unspecified: Secondary | ICD-10-CM | POA: Diagnosis present

## 2017-06-08 DIAGNOSIS — Z79891 Long term (current) use of opiate analgesic: Secondary | ICD-10-CM

## 2017-06-08 LAB — URINALYSIS, COMPLETE (UACMP) WITH MICROSCOPIC
BACTERIA UA: NONE SEEN
Bilirubin Urine: NEGATIVE
Glucose, UA: NEGATIVE mg/dL
HGB URINE DIPSTICK: NEGATIVE
KETONES UR: NEGATIVE mg/dL
Leukocytes, UA: NEGATIVE
Nitrite: NEGATIVE
PROTEIN: NEGATIVE mg/dL
Specific Gravity, Urine: 1.011 (ref 1.005–1.030)
pH: 6 (ref 5.0–8.0)

## 2017-06-08 LAB — CBC WITH DIFFERENTIAL/PLATELET
Basophils Absolute: 0 10*3/uL (ref 0–0.1)
Basophils Relative: 1 %
EOS PCT: 2 %
Eosinophils Absolute: 0.1 10*3/uL (ref 0–0.7)
HEMATOCRIT: 26.3 % — AB (ref 35.0–47.0)
Hemoglobin: 8.9 g/dL — ABNORMAL LOW (ref 12.0–16.0)
LYMPHS ABS: 1.3 10*3/uL (ref 1.0–3.6)
LYMPHS PCT: 32 %
MCH: 29.7 pg (ref 26.0–34.0)
MCHC: 33.6 g/dL (ref 32.0–36.0)
MCV: 88.5 fL (ref 80.0–100.0)
MONO ABS: 0.2 10*3/uL (ref 0.2–0.9)
Monocytes Relative: 6 %
NEUTROS ABS: 2.5 10*3/uL (ref 1.4–6.5)
Neutrophils Relative %: 59 %
Platelets: 169 10*3/uL (ref 150–440)
RBC: 2.98 MIL/uL — ABNORMAL LOW (ref 3.80–5.20)
RDW: 18.2 % — AB (ref 11.5–14.5)
WBC: 4.2 10*3/uL (ref 3.6–11.0)

## 2017-06-08 LAB — IRON AND TIBC
IRON: 119 ug/dL (ref 28–170)
Saturation Ratios: 42 % — ABNORMAL HIGH (ref 10.4–31.8)
TIBC: 285 ug/dL (ref 250–450)
UIBC: 167 ug/dL

## 2017-06-08 LAB — CK: CK TOTAL: 910 U/L — AB (ref 38–234)

## 2017-06-08 LAB — COMPREHENSIVE METABOLIC PANEL
ALBUMIN: 3.4 g/dL — AB (ref 3.5–5.0)
ALT: 59 U/L — ABNORMAL HIGH (ref 14–54)
AST: 60 U/L — AB (ref 15–41)
Alkaline Phosphatase: 87 U/L (ref 38–126)
Anion gap: 7 (ref 5–15)
BUN: 13 mg/dL (ref 6–20)
CHLORIDE: 102 mmol/L (ref 101–111)
CO2: 25 mmol/L (ref 22–32)
Calcium: 8.2 mg/dL — ABNORMAL LOW (ref 8.9–10.3)
Creatinine, Ser: 1.18 mg/dL — ABNORMAL HIGH (ref 0.44–1.00)
GFR calc Af Amer: 59 mL/min — ABNORMAL LOW (ref >60)
GFR calc non Af Amer: 51 mL/min — ABNORMAL LOW (ref >60)
GLUCOSE: 87 mg/dL (ref 65–99)
POTASSIUM: 2.9 mmol/L — AB (ref 3.5–5.1)
SODIUM: 134 mmol/L — AB (ref 135–145)
Total Bilirubin: 0.5 mg/dL (ref 0.3–1.2)
Total Protein: 6.7 g/dL (ref 6.5–8.1)

## 2017-06-08 LAB — MRSA PCR SCREENING: MRSA by PCR: NEGATIVE

## 2017-06-08 LAB — POTASSIUM: POTASSIUM: 3.7 mmol/L (ref 3.5–5.1)

## 2017-06-08 LAB — TSH: TSH: 79 u[IU]/mL — AB (ref 0.350–4.500)

## 2017-06-08 LAB — VITAMIN B12: Vitamin B-12: 287 pg/mL (ref 180–914)

## 2017-06-08 LAB — FERRITIN: Ferritin: 513 ng/mL — ABNORMAL HIGH (ref 11–307)

## 2017-06-08 LAB — GLUCOSE, CAPILLARY: Glucose-Capillary: 73 mg/dL (ref 65–99)

## 2017-06-08 LAB — MAGNESIUM: MAGNESIUM: 2 mg/dL (ref 1.7–2.4)

## 2017-06-08 LAB — LACTIC ACID, PLASMA: Lactic Acid, Venous: 1.1 mmol/L (ref 0.5–1.9)

## 2017-06-08 LAB — SEDIMENTATION RATE: Sed Rate: 65 mm/hr — ABNORMAL HIGH (ref 0–30)

## 2017-06-08 LAB — TROPONIN I: Troponin I: <0.03 ng/mL (ref <0.03)

## 2017-06-08 MED ORDER — DOCUSATE SODIUM 100 MG PO CAPS
100.0000 mg | ORAL_CAPSULE | Freq: Two times a day (BID) | ORAL | Status: DC
  Administered 2017-06-08 – 2017-06-12 (×9): 100 mg via ORAL
  Filled 2017-06-08 (×9): qty 1

## 2017-06-08 MED ORDER — FAMOTIDINE IN NACL 20-0.9 MG/50ML-% IV SOLN
20.0000 mg | Freq: Two times a day (BID) | INTRAVENOUS | Status: DC
  Administered 2017-06-08 – 2017-06-10 (×5): 20 mg via INTRAVENOUS
  Filled 2017-06-08 (×5): qty 50

## 2017-06-08 MED ORDER — TIZANIDINE HCL 4 MG PO TABS
4.0000 mg | ORAL_TABLET | Freq: Three times a day (TID) | ORAL | Status: DC
  Administered 2017-06-08 – 2017-06-12 (×12): 4 mg via ORAL
  Filled 2017-06-08 (×14): qty 1

## 2017-06-08 MED ORDER — POTASSIUM CHLORIDE CRYS ER 20 MEQ PO TBCR
20.0000 meq | EXTENDED_RELEASE_TABLET | Freq: Once | ORAL | Status: AC
  Administered 2017-06-08: 20 meq via ORAL
  Filled 2017-06-08: qty 1

## 2017-06-08 MED ORDER — LEVOTHYROXINE SODIUM 137 MCG PO TABS
137.0000 ug | ORAL_TABLET | Freq: Every day | ORAL | Status: DC
  Filled 2017-06-08: qty 1

## 2017-06-08 MED ORDER — TRAMADOL HCL 50 MG PO TABS
25.0000 mg | ORAL_TABLET | Freq: Two times a day (BID) | ORAL | Status: DC
  Administered 2017-06-08 – 2017-06-10 (×5): 25 mg via ORAL
  Filled 2017-06-08 (×5): qty 1

## 2017-06-08 MED ORDER — ONDANSETRON HCL 4 MG/2ML IJ SOLN: 4.0000 mg | Freq: Four times a day (QID) | INTRAMUSCULAR | Status: DC | PRN

## 2017-06-08 MED ORDER — ACETAMINOPHEN 650 MG RE SUPP: 650.0000 mg | Freq: Four times a day (QID) | RECTAL | Status: DC | PRN

## 2017-06-08 MED ORDER — POTASSIUM CHLORIDE IN NACL 20-0.9 MEQ/L-% IV SOLN
INTRAVENOUS | Status: DC
  Administered 2017-06-08 (×2): via INTRAVENOUS
  Filled 2017-06-08 (×5): qty 1000

## 2017-06-08 MED ORDER — ACETAMINOPHEN 325 MG PO TABS
650.0000 mg | ORAL_TABLET | Freq: Four times a day (QID) | ORAL | Status: DC | PRN
  Administered 2017-06-08 – 2017-06-10 (×2): 650 mg via ORAL
  Filled 2017-06-08 (×2): qty 2

## 2017-06-08 MED ORDER — POTASSIUM CHLORIDE 10 MEQ/100ML IV SOLN
10.0000 meq | INTRAVENOUS | Status: AC
  Administered 2017-06-08 (×2): 10 meq via INTRAVENOUS
  Filled 2017-06-08 (×2): qty 100

## 2017-06-08 MED ORDER — SODIUM CHLORIDE 0.9 % IV BOLUS
1000.0000 mL | Freq: Once | INTRAVENOUS | Status: AC
  Administered 2017-06-08: 1000 mL via INTRAVENOUS

## 2017-06-08 MED ORDER — BISACODYL 10 MG RE SUPP
10.0000 mg | Freq: Every day | RECTAL | Status: DC | PRN
  Filled 2017-06-08: qty 1

## 2017-06-08 MED ORDER — DOPAMINE-DEXTROSE 3.2-5 MG/ML-% IV SOLN
0.0000 ug/kg/min | INTRAVENOUS | Status: DC
  Administered 2017-06-08: 5 ug/kg/min via INTRAVENOUS
  Filled 2017-06-08: qty 250

## 2017-06-08 MED ORDER — KETOROLAC TROMETHAMINE 30 MG/ML IJ SOLN
30.0000 mg | Freq: Once | INTRAMUSCULAR | Status: AC
  Administered 2017-06-08: 30 mg via INTRAVENOUS
  Filled 2017-06-08: qty 1

## 2017-06-08 MED ORDER — GABAPENTIN 600 MG PO TABS
600.0000 mg | ORAL_TABLET | Freq: Two times a day (BID) | ORAL | Status: DC
  Administered 2017-06-08 – 2017-06-12 (×9): 600 mg via ORAL
  Filled 2017-06-08 (×9): qty 1

## 2017-06-08 MED ORDER — LEVOTHYROXINE SODIUM 100 MCG IV SOLR
75.0000 ug | Freq: Every day | INTRAVENOUS | Status: DC
  Administered 2017-06-08 – 2017-06-10 (×3): 75 ug via INTRAVENOUS
  Filled 2017-06-08 (×3): qty 5

## 2017-06-08 MED ORDER — MAGNESIUM SULFATE 2 GM/50ML IV SOLN
2.0000 g | Freq: Once | INTRAVENOUS | Status: AC
  Administered 2017-06-08: 2 g via INTRAVENOUS
  Filled 2017-06-08: qty 50

## 2017-06-08 MED ORDER — ONDANSETRON HCL 4 MG PO TABS: 4.0000 mg | ORAL_TABLET | Freq: Four times a day (QID) | ORAL | Status: DC | PRN

## 2017-06-08 MED ORDER — AMOXICILLIN-POT CLAVULANATE 875-125 MG PO TABS
1.0000 | ORAL_TABLET | Freq: Two times a day (BID) | ORAL | Status: DC
  Administered 2017-06-08 – 2017-06-10 (×5): 1 via ORAL
  Filled 2017-06-08 (×5): qty 1

## 2017-06-08 NOTE — [place] Notes (Signed)
K sent to lab

## 2017-06-08 NOTE — Consult Note (Signed)
[facility] Consultation      Name: Adrienne Flores MRN: [id number] DOB: 23-Apr-1965    ADMISSION DATE:  06/08/2017 CONSULTATION DATE:  06/08/2017  REFERRING MD :  Dr. Doy Hutching   CHIEF COMPLAINT:    Generalized weakness   HISTORY OF PRESENT ILLNESS    Adrienne Flores is a 52 y.o. female has a past medical history significant for RA and anemia with recent work up for SVC syndrome now with LLE pain and diffuse weakness and fatigue. In ER, Pt noted to be hypotensive and bradycardic. Also noted to have worsening anemia. Stool guaiac negative. Low BP persists despite IV fluids. She is now admitted.  She reports no nausea, vomiting, dizziness.  She was found to be bradycardic and hypotensive.  She was given IVF bolus and transferred to the ICU for management.  I have seen and examined her.  Her only complaint was left ear and jaw pain.       SIGNIFICANT EVENTS    04/27 Admitted to ICU    PAST MEDICAL HISTORY    :  Past Medical History:  Diagnosis Date  . Anemia   . At high risk for falls   . Chronic abdominal pain 2008   dates back to at least 2008.  multiple ultrasound, CT scans, x rays at [facility], [place].  [facility] colonoscopy, egd unrevealing.    . Colitis 2015   ? colitis per CTs in 2015, 2016 and 11/2015.  presumed infectious.  colonoscopy in 2015 negative for colitis.   . Constipation   . Cyclic vomiting syndrome 2015   per opinion  of Dr Granville Lewis, IBS specialist at [facility].   . Degenerative disc disease   . Depression   . Fibromyalgia   . Gastroparesis 03/2011   markedly abnormal gastric emptying study.   . Hematemesis 11/2015   mild to moderate blood in emesis appeared after 2 plus weeks of frequent, non-bloody emesis.   [name] Hemorrhoids   . Hemorrhoids   . History of chicken pox   . Hypokalemia   . Hypothyroid   . Lupus ([place])   . Porphyria ([place])   . Rheumatoid arthritis [facility])    Past Surgical History:  Procedure Laterality  Date  . ABDOMINAL HYSTERECTOMY     total  . CESAREAN SECTION     x2  . CHOLECYSTECTOMY    . COLONOSCOPY  11/2013   at [facility] Dr Kennith Gain. Hemorrhoids. sigmoid tics. random biopsies negative for microscopic colitis or any pathology.   . COLONOSCOPY WITH PROPOFOL N/A 05/10/2017   Procedure: COLONOSCOPY WITH PROPOFOL;  Surgeon: Lucilla Lame, MD;  Location: [place];  Service: Endoscopy;  Laterality: N/A;  . ESOPHAGOGASTRODUODENOSCOPY  11/2013   Dr Kennith Gain at [facility].  Normal study.  Duodenal bx negative for villous atrophy.   . ESOPHAGOGASTRODUODENOSCOPY N/A 12/09/2015   Procedure: ESOPHAGOGASTRODUODENOSCOPY (EGD);  Surgeon: Doran Stabler, MD;  Location: [facility] ENDOSCOPY;  Service: Endoscopy;  Laterality: N/A;  . ESOPHAGOGASTRODUODENOSCOPY (EGD) WITH PROPOFOL N/A 05/10/2017   Procedure: ESOPHAGOGASTRODUODENOSCOPY (EGD) WITH PROPOFOL;  Surgeon: Lucilla Lame, MD;  Location: [place];  Service: Endoscopy;  Laterality: N/A;  . KNEE SURGERY Left   . OOPHORECTOMY    . PORTA CATH INSERTION N/A 04/18/2017   Procedure: PORTA CATH INSERTION;  Surgeon: Katha Cabal, MD;  Location: [place] CV LAB;  Service: Cardiovascular;  Laterality: N/A;  . PORTA CATH INSERTION Right 05/21/2017   Procedure: PORTA CATH INSERTION;  Surgeon: Delana Meyer,  Dolores Lory, MD;  Location: [place] CV LAB;  Service: Cardiovascular;  Laterality: Right;  . REPLACEMENT TOTAL KNEE Bilateral    Prior to Admission medications   Medication Sig Start Date End Date Taking? Authorizing Provider  amoxicillin-clavulanate (AUGMENTIN) 875-125 MG tablet Take 1 tablet by mouth 2 (two) times daily for 7 days. 06/03/17 06/10/17 Yes Melynda Ripple, MD  gabapentin (NEURONTIN) 600 MG tablet Take 600 mg by mouth 2 (two) times daily.  08/01/10  Yes [provider]  levothyroxine (SYNTHROID, LEVOTHROID) 137 MCG tablet Take 137 mcg by mouth at bedtime.    Yes [provider]  Carboxymethylcellul-Glycerin (LUBRICATING  EYE DROPS OP) Place 1 drop into both eyes daily as needed (allergies).    [provider]  diphenhydrAMINE-APAP, sleep, (EXCEDRIN PM) 38-500 MG TABS Take 1 tablet by mouth at bedtime as needed (sleep).    [provider]  diphenoxylate-atropine (LOMOTIL) 2.5-0.025 MG tablet Take 1 tablet by mouth 4 (four) times daily as needed for diarrhea or loose stools. Patient not taking: Reported on 04/16/2017 12/11/16 12/11/17  Earleen Newport, MD  HYDROcodone-acetaminophen [facility]) 5-325 MG tablet Take 1 tablet by mouth every 6 (six) hours as needed for up to 7 doses for severe pain. Patient not taking: Reported on 04/16/2017 03/09/17   Darel Hong, MD  naproxen sodium (ALEVE) 220 MG tablet Take 220 mg by mouth 2 (two) times daily as needed (pain).    [provider]  ondansetron (ZOFRAN ODT) 4 MG disintegrating tablet Take 1 tablet (4 mg total) by mouth every 8 (eight) hours as needed. Patient not taking: Reported on 05/13/2017 02/07/17   Isla Pence, MD  tiZANidine (ZANAFLEX) 4 MG tablet Take 1 tablet by mouth 3 (three) times daily. 06/04/17   [provider]  traMADol (ULTRAM) 50 MG tablet Take 1 tablet (50 mg total) by mouth every 6 (six) hours as needed. Patient not taking: Reported on 05/13/2017 12/10/16   Lisa Roca, MD   Allergies  Allergen Reactions  . Morphine And Related Hives and Itching    ONLY MORPHINE, tolerates dilaudid, oxycodone, and norco ONLY MORPHINE, tolerates dilaudid, oxycodone, and norco  . Methotrexate Derivatives Other (See Comments)    Headaches   . Relafen [Nabumetone] Other (See Comments)    Headaches  . Haloperidol Nausea Only and Other (See Comments)    Patient stated that she broke out in cold sweats      FAMILY HISTORY   Family History  Problem Relation Age of Onset  . Leukemia Mother   . Arthritis Mother   . Early death Mother   . Miscarriages / [place] Mother   . Hypothyroidism Sister   . Cancer Paternal  Grandmother   . Cancer Paternal Grandfather   . Hypothyroidism Brother       SOCIAL HISTORY    reports that she has never smoked. She has never used smokeless tobacco. She reports that she does not drink alcohol or use drugs.   ROS was negative except for hearing loss and pain from left ear and jaw. She had been started on Augmentin for an ear infection. R ROS VITAL SIGNS    Temp:  [97.5 F (36.4 C)-97.7 F (36.5 C)] 97.7 F (36.5 C) (04/27 1455) Pulse Rate:  [47-65] 54 (04/27 1805) Resp:  [10-46] 10 (04/27 1805) BP: (66-106)/(51-79) 72/52 (04/27 1805) SpO2:  [94 %-100 %] 100 % (04/27 1805) Weight:  [162 lb (73.5 kg)-169 lb 5 oz (76.8 kg)] 169 lb 5 oz (76.8 kg) (  04/27 1455) HEMODYNAMICS:  hypotensive and brady cardiac  VENTILATOR SETTINGS:  RA oxygen  INTAKE / OUTPUT:  Intake/Output Summary (Last 24 hours) at 06/08/2017 1821 Last data filed at 06/08/2017 1710 Gross per 24 hour  Intake 2518.75 ml  Output -  Net 2518.75 ml       PHYSICAL EXAM    General: Well developed, well nourished, in no acute distress HEENT:  Normocephalic and atramatic Neck:   No JVD. No palpable thyroid mass Lungs: Clear bilaterally to auscultation and percussion. Heart: HRRR . Normal S1 and S2 without gallops or murmurs.  Abdomen: Bowel sounds are positive, abdomen soft and non-tender  Msk:  Back normal, normal gait. Normal strength and tone for age. Extremities: No clubbing, cyanosis or edema.   Neuro: Alert and oriented X 3. Psych:  Good affect, responds appropriately      LABS   LABS:  CBC Recent Labs  Lab 06/08/17 0910  WBC 4.2  HGB 8.9*  HCT 26.3*  PLT 169   Coag's No results for input(s): APTT, INR in the last 168 hours. BMET Recent Labs  Lab 06/08/17 0910  NA 134*  K 2.9*  CL 102  CO2 25  BUN 13  CREATININE 1.18*  GLUCOSE 87   Electrolytes Recent Labs  Lab 06/08/17 0910  CALCIUM 8.2*  MG 2.0   Sepsis Markers Recent Labs  Lab 06/08/17 0910    LATICACIDVEN 1.1   ABG No results for input(s): PHART, PCO2ART, PO2ART in the last 168 hours. Liver Enzymes Recent Labs  Lab 06/08/17 0910  AST 60*  ALT 59*  ALKPHOS 87  BILITOT 0.5  ALBUMIN 3.4*   Cardiac Enzymes Recent Labs  Lab 06/08/17 0910  TROPONINI <0.03   Glucose Recent Labs  Lab 06/08/17 1453  GLUCAP 73     Recent Results (from the past 240 hour(s))  MRSA PCR Screening     Status: None   Collection Time: 06/08/17  2:56 PM  Result Value Ref Range Status   MRSA by PCR NEGATIVE NEGATIVE Final    Comment:        The GeneXpert MRSA Assay (FDA approved for NASAL specimens only), is one component of a comprehensive MRSA colonization surveillance program. It is not intended to diagnose MRSA infection nor to guide or monitor treatment for MRSA infections. Performed at [facility], [address] [place], [place] [address]      Current Facility-Administered Medications:  .  0.9 % NaCl with KCl 20 mEq/ L  infusion, , Intravenous, Continuous, Idelle Crouch, MD, Last Rate: 125 mL/hr at 06/08/17 1700 .  acetaminophen (TYLENOL) tablet 650 mg, 650 mg, Oral, Q6H PRN, 650 mg at 06/08/17 1610 **OR** acetaminophen (TYLENOL) suppository 650 mg, 650 mg, Rectal, Q6H PRN, Idelle Crouch, MD .  amoxicillin-clavulanate (AUGMENTIN) 875-125 MG per tablet 1 tablet, 1 tablet, Oral, BID, Idelle Crouch, MD, 1 tablet at 06/08/17 1610 .  bisacodyl (DULCOLAX) suppository 10 mg, 10 mg, Rectal, Daily PRN, Idelle Crouch, MD .  docusate sodium (COLACE) capsule 100 mg, 100 mg, Oral, BID, Idelle Crouch, MD, 100 mg at 06/08/17 1610 .  DOPamine (INTROPIN) 800 mg in dextrose 5 % 250 mL (3.2 mg/mL) infusion, 0-20 mcg/kg/min, Intravenous, Titrated, Cammie Sickle A, MD .  famotidine (PEPCID) IVPB 20 mg premix, 20 mg, Intravenous, Q12H, Sparks, Leonie Douglas, MD, Stopped at 06/08/17 1411 .  gabapentin (NEURONTIN) tablet 600 mg, 600 mg, Oral, BID, Idelle Crouch, MD,  600 mg at 06/08/17  1610 .  levothyroxine (SYNTHROID, LEVOTHROID) injection 75 mcg, 75 mcg, Intravenous, Daily, [place] Dragon, MD, 75 mcg at 06/08/17 1820 .  ondansetron (ZOFRAN) tablet 4 mg, 4 mg, Oral, Q6H PRN **OR** ondansetron (ZOFRAN) injection 4 mg, 4 mg, Intravenous, Q6H PRN, [place] Reek D, MD .  potassium chloride 10 mEq in 100 mL IVPB, 10 mEq, Intravenous, Q1 Hr x 2, Paraschos, Alexander, MD, Last Rate: 100 mL/hr at 06/08/17 1815, 10 mEq at 06/08/17 1815 .  tiZANidine (ZANAFLEX) tablet 4 mg, 4 mg, Oral, TID, Idelle Crouch, MD, 4 mg at 06/08/17 1610 .  traMADol (ULTRAM) tablet 25 mg, 25 mg, Oral, Q12H, Lafayette Dragon, MD  IMAGING    US Venous Img Lower Unilateral Left  Result Date: 06/08/2017 CLINICAL DATA:  52 year old female with left lower leg pain EXAM: LEFT LOWER EXTREMITY VENOUS DOPPLER ULTRASOUND TECHNIQUE: Gray-scale sonography with graded compression, as well as color Doppler and duplex ultrasound were performed to evaluate the lower extremity deep venous systems from the level of the common femoral vein and including the common femoral, femoral, profunda femoral, popliteal and calf veins including the posterior tibial, peroneal and gastrocnemius veins when visible. The superficial great saphenous vein was also interrogated. Spectral Doppler was utilized to evaluate flow at rest and with distal augmentation maneuvers in the common femoral, femoral and popliteal veins. COMPARISON:  None. FINDINGS: Contralateral Common Femoral Vein: Respiratory phasicity is normal and symmetric with the symptomatic side. No evidence of thrombus. Normal compressibility. Common Femoral Vein: No evidence of thrombus. Normal compressibility, respiratory phasicity and response to augmentation. Saphenofemoral Junction: No evidence of thrombus. Normal compressibility and flow on color Doppler imaging. Profunda Femoral Vein: No evidence of thrombus. Normal compressibility and flow on color Doppler  imaging. Femoral Vein: No evidence of thrombus. Normal compressibility, respiratory phasicity and response to augmentation. Popliteal Vein: No evidence of thrombus. Normal compressibility, respiratory phasicity and response to augmentation. Calf Veins: No evidence of thrombus. Normal compressibility and flow on color Doppler imaging. Superficial Great Saphenous Vein: No evidence of thrombus. Normal compressibility. Venous Reflux:  None. Other Findings:  None. IMPRESSION: No evidence of deep venous thrombosis. Electronically Signed   By: Jacqulynn Cadet M.D.   On: 06/08/2017 11:09   Dg Chest Port 1 View  Result Date: 06/08/2017 CLINICAL DATA:  Bradycardia. Hypotension. Lupus and rheumatoid arthritis. EXAM: PORTABLE CHEST 1 VIEW COMPARISON:  03/11/2017 FINDINGS: The heart size and mediastinal contours are within normal limits. Right-sided power port is seen in appropriate position. Both lungs are clear. The visualized skeletal structures are unremarkable. IMPRESSION: No active disease. Electronically Signed   By: Earle Gell M.D.   On: 06/08/2017 09:49       MAJOR EVENTS/TEST RESULTS: 4/25- bradycardia; hypotension  INDWELLING DEVICES:: 4/25- Foley, peripheral IV  MICRO DATA: MRSA PCR -negative   ANTIMICROBIALS:  Augmentin   ASSESSMENT/PLAN   1. Bradycardia likely secondary to severe hypothyroidism 2. Severe Hypokalemia 3. Chronic Anemia- multi factorial 4. Ear infection 5. Systolic hypotension Plan:  1. Start IV Levothyroxine 2. Access porta cath and start Dopamine 3. Check Cortisol level 4. Continue on ABX 5. Electrolytes replacement 6. GI and DVT 7. Pain control     I have personally obtained a history, examined the patient, evaluated laboratory and independently reviewed  imaging results, formulated the assessment and plan and placed orders.  The Patient requires high complexity decision making for assessment and support, frequent evaluation and titration of  therapies, application of advanced monitoring technologies and extensive interpretation  of multiple databases. Critical Care Time devoted to patient care services described in this note is 40 minutes.    Cammie Sickle, M.D

## 2017-06-08 NOTE — ED Provider Notes (Signed)
[facility] Emergency Department Provider Note   ____________________________________________   First MD Initiated Contact with Patient 06/08/17 [phone]     (approximate)  I have reviewed the triage vital signs and the nursing notes.   HISTORY  Chief Complaint Leg Pain    HPI Adrienne Flores is a 52 y.o. female with a history of otitis media repeated abdominal CT scans history of cholecystectomy hysterectomy C-section rheumatoid arthritis porphyria and hypothyroidism and possibly a DVT of the lower leg.  Who comes in complaining of pain in the lower leg and a red spot medially just above the ankle on the left.  She says her legs have been hurting more and she has to wiggle around to get out of her bed or up out of the chair like she did before she had her knee replacement.  She is worried she may have a blood clot.  Pain is not severe and there is not any pain really associated with a red spot above the ankle most of the pain is around the knee she describes the pain is dull and achy possibly worse with movement.  She reports she had a port placed in her left chest doctor did that so she could have blood work done.  Her veins are very small she says.  There is a lot of bruising around this port.  Additionally patient is very bradycardic and hypotensive when she comes in.  Patient denies any chest pain nausea or any other complaint except for the left leg pain.  Patient reports when she gets hypertensive she gets very hypertensive on her blood pressure drops and goes down very low.   Past Medical History:  Diagnosis Date  . Anemia   . At high risk for falls   . Chronic abdominal pain 2008   dates back to at least 2008.  multiple ultrasound, CT scans, x rays at [facility], [place].  [facility] colonoscopy, egd unrevealing.    . Colitis 2015   ? colitis per CTs in 2015, 2016 and 11/2015.  presumed infectious.  colonoscopy in 2015 negative for colitis.   . Constipation   . Cyclic  vomiting syndrome 2015   per opinion  of Dr Granville Lewis, IBS specialist at [facility]   . Degenerative disc disease   . Depression   . Fibromyalgia   . Gastroparesis 03/2011   markedly abnormal gastric emptying study.   . Hematemesis 11/2015   mild to moderate blood in emesis appeared after 2 plus weeks of frequent, non-bloody emesis.   [name] Hemorrhoids   . Hemorrhoids   . History of chicken pox   . Hypokalemia   . Hypothyroid   . Lupus ([place])   . 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 Rheumatoid arthritis [facility])     Patient Active Problem List   Diagnosis Date Noted  . Hypotension 06/08/2017  . Hypokalemia 06/08/2017  . Mechanical complication of vascular device 05/16/2017  . 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 Loss of weight   . Abdominal pain, generalized   . Gastritis without bleeding   . Iron deficiency anemia 07/02/2016  . Disoriented to time 01/24/2016  . Protein-calorie malnutrition, severe 12/12/2015  . Symptomatic anemia 12/09/2015  . Acute hyponatremia 12/09/2015  . Hematemesis 12/09/2015  . Chronic hoarseness 12/09/2015  . Unintended weight loss 12/09/2015  . H/O medication noncompliance 12/07/2015  . Chronic lower back pain 08/30/2014  . Rheu arthrit of right ank/ft w involv of organs and systems ([place]) 06/16/2014  .  DDD (degenerative disc disease), cervical 06/16/2014  . 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 DDD (degenerative disc disease), lumbosacral 06/16/2014  . Diarrhea 08/22/2013  . Hypokalemia due to inadequate potassium intake 08/22/2013  . Chronic abdominal and back  pain 07/26/2013  . Nausea and vomiting 07/26/2013  . 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 Rheumatoid arthritis ([place]) 07/26/2013  . UTI (lower urinary tract infection) 07/26/2013  . Uncontrolled pain 07/26/2013  . Acute on chronic renal failure ([place]) 06/27/2013  . Colitis, infectious 06/25/2013  . AKI (acute kidney injury) ([place]) 06/25/2013  . Transaminitis 06/25/2013  . Infectious colitis  06/25/2013  . Abdominal pain 11/26/2012  . Hypothyroidism 11/26/2012  . Hyperlipidemia 11/03/2012  . Lightheadedness 10/26/2012  . Other specified abnormal immunological findings in serum 10/01/2012  . Noninfectious gastroenteritis and colitis 01/19/2012  . Primary localized osteoarthrosis of hand 04/17/2011  . Cyclic vomiting syndrome 09/14/2010  . Mixed urge and stress incontinence 08/01/2010  . Depressive disorder 12/20/2009    Past Surgical History:  Procedure Laterality Date  . ABDOMINAL HYSTERECTOMY     total  . CESAREAN SECTION     x2  . CHOLECYSTECTOMY    . COLONOSCOPY  11/2013   at [facility] Dr Kennith Gain. Hemorrhoids. sigmoid tics. random biopsies negative for microscopic colitis or any pathology.   . COLONOSCOPY WITH PROPOFOL N/A 05/10/2017   Procedure: COLONOSCOPY WITH PROPOFOL;  Surgeon: Lucilla Lame, MD;  Location: [place];  Service: Endoscopy;  Laterality: N/A;  . ESOPHAGOGASTRODUODENOSCOPY  11/2013   Dr Kennith Gain at [facility]  Normal study.  Duodenal bx negative for villous atrophy.   . ESOPHAGOGASTRODUODENOSCOPY N/A 12/09/2015   Procedure: ESOPHAGOGASTRODUODENOSCOPY (EGD);  Surgeon: Doran Stabler, MD;  Location: [facility] ENDOSCOPY;  Service: Endoscopy;  Laterality: N/A;  . ESOPHAGOGASTRODUODENOSCOPY (EGD) WITH PROPOFOL N/A 05/10/2017   Procedure: ESOPHAGOGASTRODUODENOSCOPY (EGD) WITH PROPOFOL;  Surgeon: Lucilla Lame, MD;  Location: [place];  Service: Endoscopy;  Laterality: N/A;  . KNEE SURGERY Left   . OOPHORECTOMY    . PORTA CATH INSERTION N/A 04/18/2017   Procedure: PORTA CATH INSERTION;  Surgeon: Katha Cabal, MD;  Location: [place] [facility];  Service: Cardiovascular;  Laterality: N/A;  . PORTA CATH INSERTION Right 05/21/2017   Procedure: PORTA CATH INSERTION;  Surgeon: Katha Cabal, MD;  Location: [place] [facility];  Service: Cardiovascular;  Laterality: Right;  . REPLACEMENT TOTAL KNEE Bilateral     Prior to Admission medications     Medication Sig Start Date End Date Taking? Authorizing Provider  amoxicillin-clavulanate (AUGMENTIN) 875-125 MG tablet Take 1 tablet by mouth 2 (two) times daily for 7 days. 06/03/17 06/10/17 Yes Melynda Ripple, MD  gabapentin (NEURONTIN) 600 MG tablet Take 600 mg by mouth 2 (two) times daily.  08/01/10  Yes [provider]  levothyroxine (SYNTHROID, LEVOTHROID) 137 MCG tablet Take 137 mcg by mouth at bedtime.    Yes [provider]  Carboxymethylcellul-Glycerin (LUBRICATING EYE DROPS OP) Place 1 drop into both eyes daily as needed (allergies).    [provider]  diphenhydrAMINE-APAP, sleep, (EXCEDRIN PM) 38-500 MG TABS Take 1 tablet by mouth at bedtime as needed (sleep).    [provider]  diphenoxylate-atropine (LOMOTIL) 2.5-0.025 MG tablet Take 1 tablet by mouth 4 (four) times daily as needed for diarrhea or loose stools. Patient not taking: Reported on 04/16/2017 12/11/16 12/11/17  Earleen Newport, MD  HYDROcodone-acetaminophen [facility]) 5-325 MG tablet Take 1 tablet by mouth every 6 (six) hours as needed for up  to 7 doses for severe pain. Patient not taking: Reported on 04/16/2017 03/09/17   Darel Hong, MD  naproxen sodium (ALEVE) 220 MG tablet Take 220 mg by mouth 2 (two) times daily as needed (pain).    [provider]  ondansetron (ZOFRAN ODT) 4 MG disintegrating tablet Take 1 tablet (4 mg total) by mouth every 8 (eight) hours as needed. Patient not taking: Reported on 05/13/2017 02/07/17   Isla Pence, MD  tiZANidine (ZANAFLEX) 4 MG tablet Take 1 tablet by mouth 3 (three) times daily. 06/04/17   [provider]  traMADol (ULTRAM) 50 MG tablet Take 1 tablet (50 mg total) by mouth every 6 (six) hours as needed. Patient not taking: Reported on 05/13/2017 12/10/16   Lisa Roca, MD    Allergies Morphine and related; Methotrexate derivatives; Relafen [nabumetone]; and Haloperidol  Family History  Problem Relation Age of Onset  .  Leukemia Mother   . Arthritis Mother   . Early death Mother   . Miscarriages / [place] Mother   . Hypothyroidism Sister   . Cancer Paternal Grandmother   . Cancer Paternal Grandfather   . Hypothyroidism Brother     Social History Social History   Tobacco Use  . Smoking status: Never Smoker  . Smokeless tobacco: Never Used  Substance Use Topics  . Alcohol use: No  . Drug use: No    Review of Systems  Constitutional: No fever/chills Eyes: No visual changes. ENT: No sore throat. Cardiovascular: Denies chest pain. Respiratory: Denies shortness of breath. Gastrointestinal: No abdominal pain.  No nausea, no vomiting.  No diarrhea.  No constipation. Genitourinary: Negative for dysuria. Musculoskeletal: Negative for back pain. Skin: Negative for rash. Neurological: Negative for headaches, focal weakness  ____________________________________________   PHYSICAL EXAM:  VITAL SIGNS: ED Triage Vitals  Enc Vitals Group     BP 06/08/17 0819 (!) 84/54     Pulse Rate 06/08/17 0819 65     Resp 06/08/17 0819 18     Temp 06/08/17 0819 (!) 97.5 F (36.4 C)     Temp Source 06/08/17 0819 Oral     SpO2 06/08/17 0819 100 %     Weight 06/08/17 0821 162 lb (73.5 kg)     Height 06/08/17 0821 5\' 1"  (1.549 m)     Head Circumference --      Peak Flow --      Pain Score 06/08/17 0821 7     Pain Loc --      Pain Edu? --      Excl. in [place]? --     Constitutional: Alert and oriented. Well appearing and in no acute distress. Eyes: Conjunctivae are normal.  Head: Atraumatic. Nose: No congestion/rhinnorhea. Mouth/Throat: Mucous membranes are moist.  Oropharynx non-erythematous. Neck: No stridor patient complains of pain in the left TMJ area palpation reproduces the pain she says the pain radiates down into the cheek and the jaw.  There are no obvious tooth problems she says her teeth do not hurt no palpable lumps in the temples temples not tender to palpation either Cardiovascular: Slow  rate, regular rhythm. Grossly normal heart sounds.  Good peripheral circulation. Respiratory: Normal respiratory effort.  No retractions. Lungs CTAB. Gastrointestinal: Soft and nontender. No distention. No abdominal bruits. No CVA tenderness. Musculoskeletal: No lower extremity tenderness nor edema.  No joint effusions. Neurologic:  Normal speech and language. No gross focal neurologic deficits are appreciated.  Skin:  Skin is warm, dry and intact. No rash noted.  She is  very pale Psychiatric: Mood and affect are normal. Speech and behavior are normal. Rectal: Hemoccult negative no masses no hemorrhoids ____________________________________________   LABS (all labs ordered are listed, but only abnormal results are displayed)  Labs Reviewed  COMPREHENSIVE METABOLIC PANEL - Abnormal; Notable for the following components:      Result Value   Sodium 134 (*)    Potassium 2.9 (*)    Creatinine, Ser 1.18 (*)    Calcium 8.2 (*)    Albumin 3.4 (*)    AST 60 (*)    ALT 59 (*)    GFR calc non Af Amer 51 (*)    GFR calc Af Amer 59 (*)    All other components within normal limits  CBC WITH DIFFERENTIAL/PLATELET - Abnormal; Notable for the following components:   RBC 2.98 (*)    Hemoglobin 8.9 (*)    HCT 26.3 (*)    RDW 18.2 (*)    All other components within normal limits  URINALYSIS, COMPLETE (UACMP) WITH MICROSCOPIC - Abnormal; Notable for the following components:   Color, Urine YELLOW (*)    APPearance CLEAR (*)    All other components within normal limits  CK - Abnormal; Notable for the following components:   Total CK 910 (*)    All other components within normal limits  SEDIMENTATION RATE - Abnormal; Notable for the following components:   Sed Rate 65 (*)    All other components within normal limits  IRON AND TIBC - Abnormal; Notable for the following components:   Saturation Ratios 42 (*)    All other components within normal limits  FERRITIN - Abnormal; Notable for the  following components:   Ferritin 513 (*)    All other components within normal limits  MRSA PCR SCREENING  TROPONIN I  LACTIC ACID, PLASMA  GLUCOSE, CAPILLARY  MAGNESIUM  VITAMIN B12  HIV ANTIBODY (ROUTINE TESTING)  TSH   ____________________________________________  EKG  EKG read and interpreted by me shows sinus bradycardia rate of 49 normal axis no obvious acute ST-T changes ____________________________________________  RADIOLOGY  ED MD interpretation: Ultrasound shows no evidence of DVT   Official radiology report(s): US Venous Img Lower Unilateral Left  Result Date: 06/08/2017 CLINICAL DATA:  52 year old female with left lower leg pain EXAM: LEFT LOWER EXTREMITY VENOUS DOPPLER ULTRASOUND TECHNIQUE: Gray-scale sonography with graded compression, as well as color Doppler and duplex ultrasound were performed to evaluate the lower extremity deep venous systems from the level of the common femoral vein and including the common femoral, femoral, profunda femoral, popliteal and calf veins including the posterior tibial, peroneal and gastrocnemius veins when visible. The superficial great saphenous vein was also interrogated. Spectral Doppler was utilized to evaluate flow at rest and with distal augmentation maneuvers in the common femoral, femoral and popliteal veins. COMPARISON:  None. FINDINGS: Contralateral Common Femoral Vein: Respiratory phasicity is normal and symmetric with the symptomatic side. No evidence of thrombus. Normal compressibility. Common Femoral Vein: No evidence of thrombus. Normal compressibility, respiratory phasicity and response to augmentation. Saphenofemoral Junction: No evidence of thrombus. Normal compressibility and flow on color Doppler imaging. Profunda Femoral Vein: No evidence of thrombus. Normal compressibility and flow on color Doppler imaging. Femoral Vein: No evidence of thrombus. Normal compressibility, respiratory phasicity and response to augmentation.  Popliteal Vein: No evidence of thrombus. Normal compressibility, respiratory phasicity and response to augmentation. Calf Veins: No evidence of thrombus. Normal compressibility and flow on color Doppler imaging. Superficial Great Saphenous Vein: No evidence of  thrombus. Normal compressibility. Venous Reflux:  None. Other Findings:  None. IMPRESSION: No evidence of deep venous thrombosis. Electronically Signed   By: Jacqulynn Cadet M.D.   On: 06/08/2017 11:09   Dg Chest Port 1 View  Result Date: 06/08/2017 CLINICAL DATA:  Bradycardia. Hypotension. Lupus and rheumatoid arthritis. EXAM: PORTABLE CHEST 1 VIEW COMPARISON:  03/11/2017 FINDINGS: The heart size and mediastinal contours are within normal limits. Right-sided power port is seen in appropriate position. Both lungs are clear. The visualized skeletal structures are unremarkable. IMPRESSION: No active disease. Electronically Signed   By: Earle Gell M.D.   On: 06/08/2017 09:49    ____________________________________________   PROCEDURES  Procedure(s) performed:   Procedures  Critical Care performed:   ____________________________________________   INITIAL IMPRESSION / ASSESSMENT AND PLAN / ED COURSE   Patient is bradycardic and hypotensive am not sure why reading so far turned out normal the blood pressures not really responding to fluids.  We will put her in the hospital.  For some more fluids for her elevated CK.  Not sure why it is elevated she has a little mark on the leg just above the ankle but the leg itself is not firm or tender.      ____________________________________________   FINAL CLINICAL IMPRESSION(S) / ED DIAGNOSES  Final diagnoses:  Hypotension, unspecified hypotension type     ED Discharge Orders    None       Note:  This document was prepared using Dragon voice recognition software and may include unintentional dictation errors.    Nena Polio, MD 06/08/17 [phone]

## 2017-06-08 NOTE — ED Notes (Signed)
Lab notified to add on Vitamin B12, Iron, TIBC, Ferritin

## 2017-06-08 NOTE — H&P (Signed)
History and Physical    Adrienne Flores DPO:[id number] DOB: January 29, 1966 DOA: 06/08/2017  Referring physician: Dr. Cinda Quest PCP: [name], MD  Specialists: none  Chief Complaint: weakness  HPI: Adrienne Flores is a 52 y.o. female has a past medical history significant for RA and anemia with recent work up for SVC syndrome now with LLE pain and diffuse weakness and fatigue. In ER, Pt noted to be hypotensive and bradycardic. Also noted to have worsening anemia. Stool guaiac negative. Low BP persists despite IV fluids. She is now admitted. Afebrile. No N/V/D. Denies CP or SOB  Review of Systems: The patient denies anorexia, fever, weight loss,, vision loss, decreased hearing, hoarseness, chest pain, syncope, dyspnea on exertion, peripheral edema, balance deficits, hemoptysis, abdominal pain, melena, hematochezia, severe indigestion/heartburn, hematuria, incontinence, genital sores, muscle weakness, suspicious skin lesions, transient blindness, difficulty walking, depression, unusual weight change, abnormal bleeding, enlarged lymph nodes, angioedema, and breast masses.   Past Medical History:  Diagnosis Date  . Anemia   . At high risk for falls   . Chronic abdominal pain 2008   dates back to at least 2008.  multiple ultrasound, CT scans, x rays at [facility], [place].  [facility] colonoscopy, egd unrevealing.    . Colitis 2015   ? colitis per CTs in 2015, 2016 and 11/2015.  presumed infectious.  colonoscopy in 2015 negative for colitis.   . Constipation   . Cyclic vomiting syndrome 2015   per opinion  of Dr Granville Lewis, IBS specialist at [facility].   . Degenerative disc disease   . Depression   . Fibromyalgia   . Gastroparesis 03/2011   markedly abnormal gastric emptying study.   . Hematemesis 11/2015   mild to moderate blood in emesis appeared after 2 plus weeks of frequent, non-bloody emesis.   [name] Hemorrhoids   . Hemorrhoids   . History of chicken pox   . Hypokalemia   .  Hypothyroid   . Lupus ([place])   . 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 Rheumatoid arthritis [facility])    Past Surgical History:  Procedure Laterality Date  . ABDOMINAL HYSTERECTOMY     total  . CESAREAN SECTION     x2  . CHOLECYSTECTOMY    . COLONOSCOPY  11/2013   at [facility] Dr Kennith Gain. Hemorrhoids. sigmoid tics. random biopsies negative for microscopic colitis or any pathology.   . COLONOSCOPY WITH PROPOFOL N/A 05/10/2017   Procedure: COLONOSCOPY WITH PROPOFOL;  Surgeon: Lucilla Lame, MD;  Location: [place];  Service: Endoscopy;  Laterality: N/A;  . ESOPHAGOGASTRODUODENOSCOPY  11/2013   Dr Kennith Gain at [facility].  Normal study.  Duodenal bx negative for villous atrophy.   . ESOPHAGOGASTRODUODENOSCOPY N/A 12/09/2015   Procedure: ESOPHAGOGASTRODUODENOSCOPY (EGD);  Surgeon: Doran Stabler, MD;  Location: [facility] ENDOSCOPY;  Service: Endoscopy;  Laterality: N/A;  . ESOPHAGOGASTRODUODENOSCOPY (EGD) WITH PROPOFOL N/A 05/10/2017   Procedure: ESOPHAGOGASTRODUODENOSCOPY (EGD) WITH PROPOFOL;  Surgeon: Lucilla Lame, MD;  Location: [place];  Service: Endoscopy;  Laterality: N/A;  . KNEE SURGERY Left   . OOPHORECTOMY    . PORTA CATH INSERTION N/A 04/18/2017   Procedure: PORTA CATH INSERTION;  Surgeon: Katha Cabal, MD;  Location: [place] CV LAB;  Service: Cardiovascular;  Laterality: N/A;  . PORTA CATH INSERTION Right 05/21/2017   Procedure: PORTA CATH INSERTION;  Surgeon: Katha Cabal, MD;  Location: [place] CV LAB;  Service: Cardiovascular;  Laterality: Right;  . REPLACEMENT TOTAL KNEE Bilateral    Social  History:  reports that she has never smoked. She has never used smokeless tobacco. She reports that she does not drink alcohol or use drugs.  Allergies  Allergen Reactions  . Morphine And Related Hives and Itching    ONLY MORPHINE, tolerates dilaudid, oxycodone, and norco ONLY MORPHINE, tolerates dilaudid, oxycodone, and norco  . Methotrexate Derivatives Other (See Comments)     Headaches   . Relafen [Nabumetone] Other (See Comments)    Headaches  . Haloperidol Nausea Only and Other (See Comments)    Patient stated that she broke out in cold sweats     Family History  Problem Relation Age of Onset  . Leukemia Mother   . Arthritis Mother   . Early death Mother   . Miscarriages / [place] Mother   . Hypothyroidism Sister   . Cancer Paternal Grandmother   . Cancer Paternal Grandfather   . Hypothyroidism Brother     Prior to Admission medications   Medication Sig Start Date End Date Taking? Authorizing Provider  amoxicillin-clavulanate (AUGMENTIN) 875-125 MG tablet Take 1 tablet by mouth 2 (two) times daily for 7 days. 06/03/17 06/10/17 Yes Melynda Ripple, MD  gabapentin (NEURONTIN) 600 MG tablet Take 600 mg by mouth 2 (two) times daily.  08/01/10  Yes [provider]  levothyroxine (SYNTHROID, LEVOTHROID) 137 MCG tablet Take 137 mcg by mouth at bedtime.    Yes [provider]  Carboxymethylcellul-Glycerin (LUBRICATING EYE DROPS OP) Place 1 drop into both eyes daily as needed (allergies).    [provider]  diphenhydrAMINE-APAP, sleep, (EXCEDRIN PM) 38-500 MG TABS Take 1 tablet by mouth at bedtime as needed (sleep).    [provider]  diphenoxylate-atropine (LOMOTIL) 2.5-0.025 MG tablet Take 1 tablet by mouth 4 (four) times daily as needed for diarrhea or loose stools. Patient not taking: Reported on 04/16/2017 12/11/16 12/11/17  Earleen Newport, MD  HYDROcodone-acetaminophen [facility]) 5-325 MG tablet Take 1 tablet by mouth every 6 (six) hours as needed for up to 7 doses for severe pain. Patient not taking: Reported on 04/16/2017 03/09/17   Darel Hong, MD  naproxen sodium (ALEVE) 220 MG tablet Take 220 mg by mouth 2 (two) times daily as needed (pain).    [provider]  ondansetron (ZOFRAN ODT) 4 MG disintegrating tablet Take 1 tablet (4 mg total) by mouth every 8 (eight) hours as needed. Patient not taking:  Reported on 05/13/2017 02/07/17   Isla Pence, MD  tiZANidine (ZANAFLEX) 4 MG tablet Take 1 tablet by mouth 3 (three) times daily. 06/04/17   [provider]  traMADol (ULTRAM) 50 MG tablet Take 1 tablet (50 mg total) by mouth every 6 (six) hours as needed. Patient not taking: Reported on 05/13/2017 12/10/16   Lisa Roca, MD   Physical Exam: Vitals:   06/08/17 1118 06/08/17 1120 06/08/17 1121 06/08/17 1128  BP: (!) 75/57 (!) 72/59 (!) 72/60 (!) 70/58  Pulse: (!) 50 (!) 51  (!) 47  Resp: 17 (!) 28  (!) 22  Temp:      TempSrc:      SpO2: 100% 99%  100%  Weight:      Height:         General:  No apparent distress, WDWN, [place]/AT  Eyes: PERRL, EOMI, no scleral icterus, conjunctiva clear  ENT: moist oropharynx without exudate, TM's benign, dentition good  Neck: supple, no lymphadenopathy. No bruits or thyromegaly  Cardiovascular: slow rate with regular rhythm without MRG; 2+ peripheral pulses, no JVD, no peripheral  edema  Respiratory: CTA biL, good air movement without wheezing, rhonchi or crackled . Respiratory effort normal  Abdomen: soft, non tender to palpation, positive bowel sounds, no guarding, no rebound  Skin: no rashes or lesions  Musculoskeletal: normal bulk and tone, no joint swelling  Psychiatric: normal mood and affect, A&OX3  Neurologic: CN 2-12 grossly intact, Motor strength 5/5 in all 4 groups with symmetric DTR's and non-focal sensory exam  Labs on Admission:  Basic Metabolic Panel: Recent Labs  Lab 06/08/17 0910  NA 134*  K 2.9*  CL 102  CO2 25  GLUCOSE 87  BUN 13  CREATININE 1.18*  CALCIUM 8.2*   Liver Function Tests: Recent Labs  Lab 06/08/17 0910  AST 60*  ALT 59*  ALKPHOS 87  BILITOT 0.5  PROT 6.7  ALBUMIN 3.4*   No results for input(s): LIPASE, AMYLASE in the last 168 hours. No results for input(s): AMMONIA in the last 168 hours. CBC: Recent Labs  Lab 06/08/17 0910  WBC 4.2  NEUTROABS 2.5  HGB 8.9*  HCT 26.3*  MCV  88.5  PLT 169   Cardiac Enzymes: Recent Labs  Lab 06/08/17 0910  CKTOTAL 910*  TROPONINI <0.03    BNP (last 3 results) No results for input(s): BNP in the last 8760 hours.  ProBNP (last 3 results) No results for input(s): PROBNP in the last 8760 hours.  CBG: No results for input(s): GLUCAP in the last 168 hours.  Radiological Exams on Admission: US Venous Img Lower Unilateral Left  Result Date: 06/08/2017 CLINICAL DATA:  52 year old female with left lower leg pain EXAM: LEFT LOWER EXTREMITY VENOUS DOPPLER ULTRASOUND TECHNIQUE: Gray-scale sonography with graded compression, as well as color Doppler and duplex ultrasound were performed to evaluate the lower extremity deep venous systems from the level of the common femoral vein and including the common femoral, femoral, profunda femoral, popliteal and calf veins including the posterior tibial, peroneal and gastrocnemius veins when visible. The superficial great saphenous vein was also interrogated. Spectral Doppler was utilized to evaluate flow at rest and with distal augmentation maneuvers in the common femoral, femoral and popliteal veins. COMPARISON:  None. FINDINGS: Contralateral Common Femoral Vein: Respiratory phasicity is normal and symmetric with the symptomatic side. No evidence of thrombus. Normal compressibility. Common Femoral Vein: No evidence of thrombus. Normal compressibility, respiratory phasicity and response to augmentation. Saphenofemoral Junction: No evidence of thrombus. Normal compressibility and flow on color Doppler imaging. Profunda Femoral Vein: No evidence of thrombus. Normal compressibility and flow on color Doppler imaging. Femoral Vein: No evidence of thrombus. Normal compressibility, respiratory phasicity and response to augmentation. Popliteal Vein: No evidence of thrombus. Normal compressibility, respiratory phasicity and response to augmentation. Calf Veins: No evidence of thrombus. Normal compressibility and  flow on color Doppler imaging. Superficial Great Saphenous Vein: No evidence of thrombus. Normal compressibility. Venous Reflux:  None. Other Findings:  None. IMPRESSION: No evidence of deep venous thrombosis. Electronically Signed   By: Jacqulynn Cadet M.D.   On: 06/08/2017 11:09   Dg Chest Port 1 View  Result Date: 06/08/2017 CLINICAL DATA:  Bradycardia. Hypotension. Lupus and rheumatoid arthritis. EXAM: PORTABLE CHEST 1 VIEW COMPARISON:  03/11/2017 FINDINGS: The heart size and mediastinal contours are within normal limits. Right-sided power port is seen in appropriate position. Both lungs are clear. The visualized skeletal structures are unremarkable. IMPRESSION: No active disease. Electronically Signed   By: Earle Gell M.D.   On: 06/08/2017 09:49    EKG: Independently reviewed.  Assessment/Plan Principal Problem:  Symptomatic anemia Active Problems:   Rheumatoid arthritis (HCC)   Hypotension   Hypokalemia   Will observe on floor with telemetry. Consult Cardiology for bradycardia and Hematology for anemia. Anemia labs sent. Guaiac all stools. Will continue IV fluids with K+  Diet: clear liquids Fluids: NS with K+@125  DVT Prophylaxis: TED hose  Code Status: FULL  Family Communication: none  Disposition Plan: home  Time spent: 50 min

## 2017-06-08 NOTE — ED Notes (Signed)
Patient transported to Ultrasound 

## 2017-06-08 NOTE — Consult Note (Signed)
[facility] Cardiology  CARDIOLOGY CONSULT NOTE  Patient ID: Adrienne Flores MRN: [id number] DOB/AGE: 1966-02-06 52 y.o.  Admit date: 06/08/2017 Referring Physician [place] Primary Physician [facility] Primary Cardiologist  Reason for Consultation bradycardia  HPI: 52 year old female referred for evaluation of bradycardia.  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  She presents to [facility] emergency room with generalized weakness, to be bradycardic with low blood pressure.  Admission labs were notable for potassium of 2.9, and hemoglobin and hematocrit of 8.9 and 26.3, respectively.  Troponin is less than 0.03.  Patient denies black or maroon stools.  She is also noted to be bradycardic with heart rates in the 50s.  Which reveals apparent sinus bradycardia at a rate of 57 bpm.  Patient denies chest pain.  She has chronic mild exertional dyspnea.  Review of systems complete and found to be negative unless listed above     Past Medical History:  Diagnosis Date  . Anemia   . At high risk for falls   . Chronic abdominal pain 2008   dates back to at least 2008.  multiple ultrasound, CT scans, x rays at [facility], [place].  [facility] colonoscopy, egd unrevealing.    . Colitis 2015   ? colitis per CTs in 2015, 2016 and 11/2015.  presumed infectious.  colonoscopy in 2015 negative for colitis.   . Constipation   . Cyclic vomiting syndrome 2015   per opinion  of Dr Granville Lewis, IBS specialist at [facility]   . Degenerative disc disease   . Depression   . Fibromyalgia   . Gastroparesis 03/2011   markedly abnormal gastric emptying study.   . Hematemesis 11/2015   mild to moderate blood in emesis appeared after 2 plus weeks of frequent, non-bloody emesis.   [name] Hemorrhoids   . Hemorrhoids   . History of chicken pox   . Hypokalemia   . Hypothyroid   . Lupus ([place])   . 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 Rheumatoid arthritis [facility])     Past Surgical  History:  Procedure Laterality Date  . ABDOMINAL HYSTERECTOMY     total  . CESAREAN SECTION     x2  . CHOLECYSTECTOMY    . COLONOSCOPY  11/2013   at [facility] Dr Kennith Gain. Hemorrhoids. sigmoid tics. random biopsies negative for microscopic colitis or any pathology.   . COLONOSCOPY WITH PROPOFOL N/A 05/10/2017   Procedure: COLONOSCOPY WITH PROPOFOL;  Surgeon: Lucilla Lame, MD;  Location: [place];  Service: Endoscopy;  Laterality: N/A;  . ESOPHAGOGASTRODUODENOSCOPY  11/2013   Dr Kennith Gain at [facility].  Normal study.  Duodenal bx negative for villous atrophy.   . ESOPHAGOGASTRODUODENOSCOPY N/A 12/09/2015   Procedure: ESOPHAGOGASTRODUODENOSCOPY (EGD);  Surgeon: Doran Stabler, MD;  Location: [facility] ENDOSCOPY;  Service: Endoscopy;  Laterality: N/A;  . ESOPHAGOGASTRODUODENOSCOPY (EGD) WITH PROPOFOL N/A 05/10/2017   Procedure: ESOPHAGOGASTRODUODENOSCOPY (EGD) WITH PROPOFOL;  Surgeon: Lucilla Lame, MD;  Location: [place];  Service: Endoscopy;  Laterality: N/A;  . KNEE SURGERY Left   . OOPHORECTOMY    . PORTA CATH INSERTION N/A 04/18/2017   Procedure: PORTA CATH INSERTION;  Surgeon: Katha Cabal, MD;  Location: [place] CV LAB;  Service: Cardiovascular;  Laterality: N/A;  . PORTA CATH INSERTION Right 05/21/2017   Procedure: PORTA CATH INSERTION;  Surgeon: Katha Cabal, MD;  Location: [place] CV LAB;  Service: Cardiovascular;  Laterality: Right;  . REPLACEMENT TOTAL KNEE Bilateral  Medications Prior to Admission  Medication Sig Dispense Refill Last Dose  . amoxicillin-clavulanate (AUGMENTIN) 875-125 MG tablet Take 1 tablet by mouth 2 (two) times daily for 7 days. 14 tablet 0 06/07/2017 at Unknown time  . gabapentin (NEURONTIN) 600 MG tablet Take 600 mg by mouth 2 (two) times daily.    06/07/2017 at Unknown time  . levothyroxine (SYNTHROID, LEVOTHROID) 137 MCG tablet Take 137 mcg by mouth at bedtime.    06/07/2017 at Unknown time  . Carboxymethylcellul-Glycerin (LUBRICATING  EYE DROPS OP) Place 1 drop into both eyes daily as needed (allergies).   prn at prm  . diphenhydrAMINE-APAP, sleep, (EXCEDRIN PM) 38-500 MG TABS Take 1 tablet by mouth at bedtime as needed (sleep).   prn at prn  . diphenoxylate-atropine (LOMOTIL) 2.5-0.025 MG tablet Take 1 tablet by mouth 4 (four) times daily as needed for diarrhea or loose stools. (Patient not taking: Reported on 04/16/2017) 30 tablet 1 Not Taking at Unknown time  . HYDROcodone-acetaminophen (NORCO) 5-325 MG tablet Take 1 tablet by mouth every 6 (six) hours as needed for up to 7 doses for severe pain. (Patient not taking: Reported on 04/16/2017) 7 tablet 0 Not Taking at Unknown time  . naproxen sodium (ALEVE) 220 MG tablet Take 220 mg by mouth 2 (two) times daily as needed (pain).   prn at prn  . ondansetron (ZOFRAN ODT) 4 MG disintegrating tablet Take 1 tablet (4 mg total) by mouth every 8 (eight) hours as needed. (Patient not taking: Reported on 05/13/2017) 10 tablet 0 Not Taking at Unknown time  . tiZANidine (ZANAFLEX) 4 MG tablet Take 1 tablet by mouth 3 (three) times daily.  1 prn at prn  . traMADol (ULTRAM) 50 MG tablet Take 1 tablet (50 mg total) by mouth every 6 (six) hours as needed. (Patient not taking: Reported on 05/13/2017) 10 tablet 0 Not Taking at Unknown time   Social History   Socioeconomic History  . Marital status: Married    Spouse name: Not on file  . Number of children: Not on file  . Years of education: Not on file  . Highest education level: Not on file  Occupational History  . Not on file  Social Needs  . Financial resource strain: Not on file  . Food insecurity:    Worry: Not on file    Inability: Not on file  . Transportation needs:    Medical: Not on file    Non-medical: Not on file  Tobacco Use  . Smoking status: Never Smoker  . Smokeless tobacco: Never Used  Substance and Sexual Activity  . Alcohol use: No  . Drug use: No  . Sexual activity: Yes    Partners: Male  Lifestyle  . Physical  activity:    Days per week: Not on file    Minutes per session: Not on file  . Stress: Not on file  Relationships  . Social connections:    Talks on phone: Not on file    Gets together: Not on file    Attends religious service: Not on file    Active member of club or organization: Not on file    Attends meetings of clubs or organizations: Not on file    Relationship status: Not on file  . Intimate partner violence:    Fear of current or ex partner: Not on file    Emotionally abused: Not on file    Physically abused: Not on file    Forced sexual activity: Not on  file  Other Topics Concern  . Not on file  Social History Narrative  . Not on file    Family History  Problem Relation Age of Onset  . Leukemia Mother   . Arthritis Mother   . Early death Mother   . Miscarriages / [place] Mother   . Hypothyroidism Sister   . Cancer Paternal Grandmother   . Cancer Paternal Grandfather   . Hypothyroidism Brother       Review of systems complete and found to be negative unless listed above      PHYSICAL EXAM  General: Well developed, well nourished, in no acute distress HEENT:  Normocephalic and atramatic Neck:  No JVD.  Lungs: Clear bilaterally to auscultation and percussion. Heart: HRRR . Normal S1 and S2 without gallops or murmurs.  Abdomen: Bowel sounds are positive, abdomen soft and non-tender  Msk:  Back normal, normal gait. Normal strength and tone for age. Extremities: No clubbing, cyanosis or edema.   Neuro: Alert and oriented X 3. Psych:  Good affect, responds appropriately  Labs:   Lab Results  Component Value Date   WBC 4.2 06/08/2017   HGB 8.9 (L) 06/08/2017   HCT 26.3 (L) 06/08/2017   MCV 88.5 06/08/2017   PLT 169 06/08/2017    Recent Labs  Lab 06/08/17 0910  NA 134*  K 2.9*  CL 102  CO2 25  BUN 13  CREATININE 1.18*  CALCIUM 8.2*  PROT 6.7  BILITOT 0.5  ALKPHOS 87  ALT 59*  AST 60*  GLUCOSE 87   Lab Results  Component Value Date    CKTOTAL 910 (H) 06/08/2017   CKMB 0.9 11/03/2012   TROPONINI <0.03 06/08/2017    Lab Results  Component Value Date   CHOL 289 (H) 06/25/2013   Lab Results  Component Value Date   HDL 58 06/25/2013   Lab Results  Component Value Date   LDLCALC 201 (H) 06/25/2013   Lab Results  Component Value Date   TRIG 152 (H) 06/25/2013   Lab Results  Component Value Date   CHOLHDL 5.0 06/25/2013   No results found for: LDLDIRECT    Radiology: US Venous Img Lower Unilateral Left  Result Date: 06/08/2017 CLINICAL DATA:  52 year old female with left lower leg pain EXAM: LEFT LOWER EXTREMITY VENOUS DOPPLER ULTRASOUND TECHNIQUE: Gray-scale sonography with graded compression, as well as color Doppler and duplex ultrasound were performed to evaluate the lower extremity deep venous systems from the level of the common femoral vein and including the common femoral, femoral, profunda femoral, popliteal and calf veins including the posterior tibial, peroneal and gastrocnemius veins when visible. The superficial great saphenous vein was also interrogated. Spectral Doppler was utilized to evaluate flow at rest and with distal augmentation maneuvers in the common femoral, femoral and popliteal veins. COMPARISON:  None. FINDINGS: Contralateral Common Femoral Vein: Respiratory phasicity is normal and symmetric with the symptomatic side. No evidence of thrombus. Normal compressibility. Common Femoral Vein: No evidence of thrombus. Normal compressibility, respiratory phasicity and response to augmentation. Saphenofemoral Junction: No evidence of thrombus. Normal compressibility and flow on color Doppler imaging. Profunda Femoral Vein: No evidence of thrombus. Normal compressibility and flow on color Doppler imaging. Femoral Vein: No evidence of thrombus. Normal compressibility, respiratory phasicity and response to augmentation. Popliteal Vein: No evidence of thrombus. Normal compressibility, respiratory phasicity and  response to augmentation. Calf Veins: No evidence of thrombus. Normal compressibility and flow on color Doppler imaging. Superficial Great Saphenous Vein: No evidence of thrombus.  Normal compressibility. Venous Reflux:  None. Other Findings:  None. IMPRESSION: No evidence of deep venous thrombosis. Electronically Signed   By: Jacqulynn Cadet M.D.   On: 06/08/2017 11:09   Dg Chest Port 1 View  Result Date: 06/08/2017 CLINICAL DATA:  Bradycardia. Hypotension. Lupus and rheumatoid arthritis. EXAM: PORTABLE CHEST 1 VIEW COMPARISON:  03/11/2017 FINDINGS: The heart size and mediastinal contours are within normal limits. Right-sided power port is seen in appropriate position. Both lungs are clear. The visualized skeletal structures are unremarkable. IMPRESSION: No active disease. Electronically Signed   By: Earle Gell M.D.   On: 06/08/2017 09:49    EKG: Pending  ASSESSMENT AND PLAN:   1.  Bradycardia, of uncertain significance, telemetry reveals sinus bradycardia rate of 57 bpm 2.  Hypokalemia of uncertain etiology 3.  Worsening anemia, without history of GI bleed  Recommendations  1.  ECG 2.  Replete potassium and magnesium 3.  Anemia work-up 4.  No indication at this time for temporary or permanent pacemaker  Signed: Isaias Cowman MD,PhD, [facility] 06/08/2017, 3:18 PM

## 2017-06-08 NOTE — ED Triage Notes (Signed)
L lower leg pain since last night. Denies injury. Slight redness seen behind L ankle however states this is not the site of pain. States pain is internal. No edema noted.

## 2017-06-08 NOTE — ED Notes (Signed)
[place] notified to get pt. XR already in room.

## 2017-06-09 ENCOUNTER — Observation Stay
Admit: 2017-06-09 | Discharge: 2017-06-09 | Disposition: A | Discharge status: Home / Self Care | Payer: Medicare Other | Attending: Cardiology | Admitting: Cardiology

## 2017-06-09 DIAGNOSIS — M2669 Other specified disorders of temporomandibular joint: Secondary | ICD-10-CM | POA: Diagnosis present

## 2017-06-09 DIAGNOSIS — Z79891 Long term (current) use of opiate analgesic: Secondary | ICD-10-CM | POA: Diagnosis not present

## 2017-06-09 DIAGNOSIS — E039 Hypothyroidism, unspecified: Secondary | ICD-10-CM | POA: Diagnosis not present

## 2017-06-09 DIAGNOSIS — E2740 Unspecified adrenocortical insufficiency: Secondary | ICD-10-CM | POA: Diagnosis not present

## 2017-06-09 DIAGNOSIS — Z885 Allergy status to narcotic agent status: Secondary | ICD-10-CM | POA: Diagnosis not present

## 2017-06-09 DIAGNOSIS — Z79899 Other long term (current) drug therapy: Secondary | ICD-10-CM | POA: Diagnosis not present

## 2017-06-09 DIAGNOSIS — Z791 Long term (current) use of non-steroidal anti-inflammatories (NSAID): Secondary | ICD-10-CM | POA: Diagnosis not present

## 2017-06-09 DIAGNOSIS — R001 Bradycardia, unspecified: Secondary | ICD-10-CM | POA: Diagnosis not present

## 2017-06-09 DIAGNOSIS — D529 Folate deficiency anemia, unspecified: Secondary | ICD-10-CM | POA: Diagnosis not present

## 2017-06-09 DIAGNOSIS — M069 Rheumatoid arthritis, unspecified: Secondary | ICD-10-CM

## 2017-06-09 DIAGNOSIS — E876 Hypokalemia: Secondary | ICD-10-CM | POA: Diagnosis present

## 2017-06-09 DIAGNOSIS — Z96653 Presence of artificial knee joint, bilateral: Secondary | ICD-10-CM | POA: Diagnosis present

## 2017-06-09 DIAGNOSIS — R946 Abnormal results of thyroid function studies: Secondary | ICD-10-CM

## 2017-06-09 DIAGNOSIS — E538 Deficiency of other specified B group vitamins: Secondary | ICD-10-CM | POA: Diagnosis present

## 2017-06-09 DIAGNOSIS — Z90710 Acquired absence of both cervix and uterus: Secondary | ICD-10-CM | POA: Diagnosis not present

## 2017-06-09 DIAGNOSIS — E875 Hyperkalemia: Secondary | ICD-10-CM | POA: Diagnosis present

## 2017-06-09 DIAGNOSIS — M797 Fibromyalgia: Secondary | ICD-10-CM | POA: Diagnosis present

## 2017-06-09 DIAGNOSIS — I959 Hypotension, unspecified: Secondary | ICD-10-CM | POA: Diagnosis not present

## 2017-06-09 DIAGNOSIS — Z79890 Hormone replacement therapy (postmenopausal): Secondary | ICD-10-CM | POA: Diagnosis not present

## 2017-06-09 DIAGNOSIS — D649 Anemia, unspecified: Secondary | ICD-10-CM | POA: Diagnosis not present

## 2017-06-09 DIAGNOSIS — Z888 Allergy status to other drugs, medicaments and biological substances status: Secondary | ICD-10-CM | POA: Diagnosis not present

## 2017-06-09 DIAGNOSIS — Z7901 Long term (current) use of anticoagulants: Secondary | ICD-10-CM | POA: Diagnosis not present

## 2017-06-09 DIAGNOSIS — Z9181 History of falling: Secondary | ICD-10-CM | POA: Diagnosis not present

## 2017-06-09 DIAGNOSIS — Z8349 Family history of other endocrine, nutritional and metabolic diseases: Secondary | ICD-10-CM | POA: Diagnosis not present

## 2017-06-09 DIAGNOSIS — Z9049 Acquired absence of other specified parts of digestive tract: Secondary | ICD-10-CM | POA: Diagnosis not present

## 2017-06-09 DIAGNOSIS — Z806 Family history of leukemia: Secondary | ICD-10-CM | POA: Diagnosis not present

## 2017-06-09 DIAGNOSIS — Z8261 Family history of arthritis: Secondary | ICD-10-CM | POA: Diagnosis not present

## 2017-06-09 DIAGNOSIS — H6690 Otitis media, unspecified, unspecified ear: Secondary | ICD-10-CM | POA: Diagnosis not present

## 2017-06-09 LAB — COMPREHENSIVE METABOLIC PANEL
ALBUMIN: 3.3 g/dL — AB (ref 3.5–5.0)
ALK PHOS: 85 U/L (ref 38–126)
ALT: 50 U/L (ref 14–54)
ANION GAP: 4 — AB (ref 5–15)
AST: 50 U/L — ABNORMAL HIGH (ref 15–41)
BUN: 7 mg/dL (ref 6–20)
CALCIUM: 7.7 mg/dL — AB (ref 8.9–10.3)
CO2: 25 mmol/L (ref 22–32)
Chloride: 107 mmol/L (ref 101–111)
Creatinine, Ser: 0.95 mg/dL (ref 0.44–1.00)
GFR calc Af Amer: >60 mL/min (ref >60)
GFR calc non Af Amer: >60 mL/min (ref >60)
GLUCOSE: 82 mg/dL (ref 65–99)
POTASSIUM: 4 mmol/L (ref 3.5–5.1)
SODIUM: 136 mmol/L (ref 135–145)
Total Bilirubin: 0.7 mg/dL (ref 0.3–1.2)
Total Protein: 6.8 g/dL (ref 6.5–8.1)

## 2017-06-09 LAB — CBC
HEMATOCRIT: 28.4 % — AB (ref 35.0–47.0)
HEMOGLOBIN: 9.9 g/dL — AB (ref 12.0–16.0)
MCH: 31.1 pg (ref 26.0–34.0)
MCHC: 34.8 g/dL (ref 32.0–36.0)
MCV: 89.2 fL (ref 80.0–100.0)
Platelets: 155 10*3/uL (ref 150–440)
RBC: 3.19 MIL/uL — ABNORMAL LOW (ref 3.80–5.20)
RDW: 18.3 % — ABNORMAL HIGH (ref 11.5–14.5)
WBC: 4 10*3/uL (ref 3.6–11.0)

## 2017-06-09 LAB — GLUCOSE, CAPILLARY
GLUCOSE-CAPILLARY: 221 mg/dL — AB (ref 65–99)
Glucose-Capillary: 69 mg/dL (ref 65–99)

## 2017-06-09 LAB — DAT, POLYSPECIFIC AHG (ARMC ONLY): Polyspecific AHG test: NEGATIVE

## 2017-06-09 LAB — FOLATE: Folate: 5.5 ng/mL — ABNORMAL LOW (ref >5.9)

## 2017-06-09 LAB — LACTIC ACID, PLASMA: Lactic Acid, Venous: 1.1 mmol/L (ref 0.5–1.9)

## 2017-06-09 LAB — RETICULOCYTES
RBC.: 3.19 MIL/uL — ABNORMAL LOW (ref 3.80–5.20)
Retic Count, Absolute: 114.8 10*3/uL (ref 19.0–183.0)
Retic Ct Pct: 3.6 % — ABNORMAL HIGH (ref 0.4–3.1)

## 2017-06-09 LAB — CORTISOL-AM, BLOOD: Cortisol - AM: 11 ug/dL (ref 6.7–22.6)

## 2017-06-09 MED ORDER — NOREPINEPHRINE 4 MG/250ML-% IV SOLN
0.0000 ug/min | INTRAVENOUS | Status: DC
  Administered 2017-06-09: 2 ug/min via INTRAVENOUS
  Filled 2017-06-09: qty 250

## 2017-06-09 MED ORDER — HYDROCORTISONE NA SUCCINATE PF 100 MG IJ SOLR
100.0000 mg | Freq: Three times a day (TID) | INTRAMUSCULAR | Status: DC
  Administered 2017-06-09 – 2017-06-10 (×2): 100 mg via INTRAVENOUS
  Filled 2017-06-09 (×2): qty 2

## 2017-06-09 MED ORDER — HYDROCORTISONE NA SUCCINATE PF 100 MG IJ SOLR
100.0000 mg | Freq: Three times a day (TID) | INTRAMUSCULAR | Status: DC
Start: 2017-06-09 — End: 2017-06-09
  Administered 2017-06-09: 100 mg via INTRAVENOUS
  Filled 2017-06-09: qty 2

## 2017-06-09 MED ORDER — ENSURE ENLIVE PO LIQD
237.0000 mL | Freq: Two times a day (BID) | ORAL | Status: DC
  Administered 2017-06-09 – 2017-06-10 (×3): 237 mL via ORAL

## 2017-06-09 MED ORDER — FOLIC ACID 1 MG PO TABS
1.0000 mg | ORAL_TABLET | Freq: Every day | ORAL | Status: DC
  Administered 2017-06-09 – 2017-06-12 (×4): 1 mg via ORAL
  Filled 2017-06-09 (×4): qty 1

## 2017-06-09 MED ORDER — INSULIN ASPART 100 UNIT/ML ~~LOC~~ SOLN
0.0000 [IU] | Freq: Three times a day (TID) | SUBCUTANEOUS | Status: DC
  Administered 2017-06-10 (×2): 2 [IU] via SUBCUTANEOUS
  Filled 2017-06-09 (×2): qty 1

## 2017-06-09 MED ORDER — PHENOL 1.4 % MT LIQD
1.0000 | OROMUCOSAL | Status: DC | PRN
  Filled 2017-06-09: qty 177

## 2017-06-09 NOTE — Progress Notes (Signed)
[facility] NAME: Adrienne Flores    MR#:  [id number]  DATE OF BIRTH:  01/04/1966  SUBJECTIVE:  CHIEF COMPLAINT:   Chief Complaint  Patient presents with  . Leg Pain  Overnight patient continued to have episodes of bradycardia and hypotension requiring dopamine drip, cardiology note reviewed  REVIEW OF SYSTEMS:  CONSTITUTIONAL: No fever, fatigue or weakness.  EYES: No blurred or double vision.  EARS, NOSE, AND THROAT: No tinnitus or ear pain.  RESPIRATORY: No cough, shortness of breath, wheezing or hemoptysis.  CARDIOVASCULAR: No chest pain, orthopnea, edema.  GASTROINTESTINAL: No nausea, vomiting, diarrhea or abdominal pain.  GENITOURINARY: No dysuria, hematuria.  ENDOCRINE: No polyuria, nocturia,  HEMATOLOGY: No anemia, easy bruising or bleeding SKIN: No rash or lesion. MUSCULOSKELETAL: No joint pain or arthritis.   NEUROLOGIC: No tingling, numbness, weakness.  PSYCHIATRY: No anxiety or depression.   ROS  DRUG ALLERGIES:   Allergies  Allergen Reactions  . Morphine And Related Hives and Itching    ONLY MORPHINE, tolerates dilaudid, oxycodone, and norco ONLY MORPHINE, tolerates dilaudid, oxycodone, and norco  . Methotrexate Derivatives Other (See Comments)    Headaches   . Relafen [Nabumetone] Other (See Comments)    Headaches  . Haloperidol Nausea Only and Other (See Comments)    Patient stated that she broke out in cold sweats     VITALS:  Blood pressure 107/67, pulse (!) 57, temperature 97.6 F (36.4 C), temperature source Oral, resp. rate 13, height 5\' 1"  (1.549 m), weight 75.4 kg (166 lb 3.6 oz), SpO2 96 %.  PHYSICAL EXAMINATION:  GENERAL:  52 y.o.-year-old patient lying in the bed with no acute distress.  EYES: Pupils equal, round, reactive to light and accommodation. No scleral icterus. Extraocular muscles intact.  HEENT: Head atraumatic, normocephalic. Oropharynx and nasopharynx clear.  NECK:  Supple, no  jugular venous distention. No thyroid enlargement, no tenderness.  LUNGS: Normal breath sounds bilaterally, no wheezing, rales,rhonchi or crepitation. No use of accessory muscles of respiration.  CARDIOVASCULAR: S1, S2 normal. No murmurs, rubs, or gallops.  ABDOMEN: Soft, nontender, nondistended. Bowel sounds present. No organomegaly or mass.  EXTREMITIES: No pedal edema, cyanosis, or clubbing.  NEUROLOGIC: Cranial nerves II through XII are intact. Muscle strength 5/5 in all extremities. Sensation intact. Gait not checked.  PSYCHIATRIC: The patient is alert and oriented x 3.  SKIN: No obvious rash, lesion, or ulcer.   Physical Exam LABORATORY PANEL:   CBC Recent Labs  Lab 06/09/17 0427  WBC 4.0  HGB 9.9*  HCT 28.4*  PLT 155   ------------------------------------------------------------------------------------------------------------------  Chemistries  Recent Labs  Lab 06/08/17 0910  06/09/17 0427  NA 134*  --  136  K 2.9*   < > 4.0  CL 102  --  107  CO2 25  --  25  GLUCOSE 87  --  82  BUN 13  --  7  CREATININE 1.18*  --  0.95  CALCIUM 8.2*  --  7.7*  MG 2.0  --   --   AST 60*  --  50*  ALT 59*  --  50  ALKPHOS 87  --  85  BILITOT 0.5  --  0.7   < > = values in this interval not displayed.   ------------------------------------------------------------------------------------------------------------------  Cardiac Enzymes Recent Labs  Lab 06/08/17 0910  TROPONINI <0.03   ------------------------------------------------------------------------------------------------------------------  RADIOLOGY:  US Venous Img Lower Unilateral Left  Result Date: 06/08/2017 CLINICAL DATA:  52 year old female  with left lower leg pain EXAM: LEFT LOWER EXTREMITY VENOUS DOPPLER ULTRASOUND TECHNIQUE: Gray-scale sonography with graded compression, as well as color Doppler and duplex ultrasound were performed to evaluate the lower extremity deep venous systems from the level of the  common femoral vein and including the common femoral, femoral, profunda femoral, popliteal and calf veins including the posterior tibial, peroneal and gastrocnemius veins when visible. The superficial great saphenous vein was also interrogated. Spectral Doppler was utilized to evaluate flow at rest and with distal augmentation maneuvers in the common femoral, femoral and popliteal veins. COMPARISON:  None. FINDINGS: Contralateral Common Femoral Vein: Respiratory phasicity is normal and symmetric with the symptomatic side. No evidence of thrombus. Normal compressibility. Common Femoral Vein: No evidence of thrombus. Normal compressibility, respiratory phasicity and response to augmentation. Saphenofemoral Junction: No evidence of thrombus. Normal compressibility and flow on color Doppler imaging. Profunda Femoral Vein: No evidence of thrombus. Normal compressibility and flow on color Doppler imaging. Femoral Vein: No evidence of thrombus. Normal compressibility, respiratory phasicity and response to augmentation. Popliteal Vein: No evidence of thrombus. Normal compressibility, respiratory phasicity and response to augmentation. Calf Veins: No evidence of thrombus. Normal compressibility and flow on color Doppler imaging. Superficial Great Saphenous Vein: No evidence of thrombus. Normal compressibility. Venous Reflux:  None. Other Findings:  None. IMPRESSION: No evidence of deep venous thrombosis. Electronically Signed   By: Jacqulynn Cadet M.D.   On: 06/08/2017 11:09   Dg Chest Port 1 View  Result Date: 06/08/2017 CLINICAL DATA:  Bradycardia. Hypotension. Lupus and rheumatoid arthritis. EXAM: PORTABLE CHEST 1 VIEW COMPARISON:  03/11/2017 FINDINGS: The heart size and mediastinal contours are within normal limits. Right-sided power port is seen in appropriate position. Both lungs are clear. The visualized skeletal structures are unremarkable. IMPRESSION: No active disease. Electronically Signed   By: Earle Gell  M.D.   On: 06/08/2017 09:49    ASSESSMENT AND PLAN:  1 acute sinus bradycardia Secondary to severe hypothyroidism Did require dopamine drip overnight Cardiology input appreciated  2 acute hypokalemia  Repleted   3 acute hypotension  Improved Most likely secondary to severe hypothyroidism and bradycardia  Did require dopamine drip overnight   4 acute severe hypothyroidism Continue IV Synthroid  5 acute on chronic anemia Stable   All the records are reviewed and case discussed with Care Management/Social Workerr. Management plans discussed with the patient, family and they are in agreement.  CODE STATUS: full  TOTAL TIME TAKING CARE OF THIS PATIENT: 35 minutes.     POSSIBLE D/C IN 1-3 DAYS, DEPENDING ON CLINICAL CONDITION.   Avel Peace Briscoe Daniello M.D on 06/09/2017   Between 7am to 6pm - Pager - [phone]  After 6pm go to [email or web address] - password [email or web address] [facility]  Office  [phone]  CC: Primary care physician; [name], MD  Note: This dictation was prepared with Dragon dictation along with smaller phrase technology. Any transcriptional errors that result from this process are unintentional.

## 2017-06-09 NOTE — Consult Note (Signed)
.. Adrienne Flores, Adrienne Flores [id number] 01-22-1966 Max Sane, MD  Reason for Consult: ear pain  HPI: 52 y.o.admitted to ICU and reported ear pain.  Per patient was diagnosed as outpatient with ear infection and prescribed an antibiotic.  She reports left jaw pain and decreased hearing.  She reports decreased hearing for quite some time.  Asked to evaluate patient in ICU for concern for ear infection.  Allergies:  Allergies  Allergen Reactions  . Morphine And Related Hives and Itching    ONLY MORPHINE, tolerates dilaudid, oxycodone, and norco ONLY MORPHINE, tolerates dilaudid, oxycodone, and norco  . Methotrexate Derivatives Other (See Comments)    Headaches   . Relafen [Nabumetone] Other (See Comments)    Headaches  . Haloperidol Nausea Only and Other (See Comments)    Patient stated that she broke out in cold sweats     ROS: Review of systems normal other than 12 systems except per HPI.  PMH:  Past Medical History:  Diagnosis Date  . Anemia   . At high risk for falls   . Chronic abdominal pain 2008   dates back to at least 2008.  multiple ultrasound, CT scans, x rays at [facility] [place].  [facility] colonoscopy, egd unrevealing.    . Colitis 2015   ? colitis per CTs in 2015, 2016 and 11/2015.  presumed infectious.  colonoscopy in 2015 negative for colitis.   . Constipation   . Cyclic vomiting syndrome 2015   per opinion  of Dr Granville Lewis, IBS specialist at [facility].   . Degenerative disc disease   . Depression   . Fibromyalgia   . Gastroparesis 03/2011   markedly abnormal gastric emptying study.   . Hematemesis 11/2015   mild to moderate blood in emesis appeared after 2 plus weeks of frequent, non-bloody emesis.   [name] Hemorrhoids   . Hemorrhoids   . History of chicken pox   . Hypokalemia   . Hypothyroid   . Lupus ([place])   . 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 Rheumatoid arthritis (HCC)     FH:  Family History  Problem Relation Age of Onset  . Leukemia Mother   . Arthritis Mother   .  Early death Mother   . Miscarriages / [place] Mother   . Hypothyroidism Sister   . Cancer Paternal Grandmother   . Cancer Paternal Grandfather   . Hypothyroidism Brother     SH:  Social History   Socioeconomic History  . Marital status: Married    Spouse name: Not on file  . Number of children: Not on file  . Years of education: Not on file  . Highest education level: Not on file  Occupational History  . Not on file  Social Needs  . Financial resource strain: Not on file  . Food insecurity:    Worry: Not on file    Inability: Not on file  . Transportation needs:    Medical: Not on file    Non-medical: Not on file  Tobacco Use  . Smoking status: Never Smoker  . Smokeless tobacco: Never Used  Substance and Sexual Activity  . Alcohol use: No  . Drug use: No  . Sexual activity: Yes    Partners: Male  Lifestyle  . Physical activity:    Days per week: Not on file    Minutes per session: Not on file  . Stress: Not on file  Relationships  . Social connections:    Talks on phone: Not on file  Gets together: Not on file    Attends religious service: Not on file    Active member of club or organization: Not on file    Attends meetings of clubs or organizations: Not on file    Relationship status: Not on file  . Intimate partner violence:    Fear of current or ex partner: Not on file    Emotionally abused: Not on file    Physically abused: Not on file    Forced sexual activity: Not on file  Other Topics Concern  . Not on file  Social History Narrative  . Not on file    PSH:  Past Surgical History:  Procedure Laterality Date  . ABDOMINAL HYSTERECTOMY     total  . CESAREAN SECTION     x2  . CHOLECYSTECTOMY    . COLONOSCOPY  11/2013   at [facility] Dr Kennith Gain. Hemorrhoids. sigmoid tics. random biopsies negative for microscopic colitis or any pathology.   . COLONOSCOPY WITH PROPOFOL N/A 05/10/2017   Procedure: COLONOSCOPY WITH PROPOFOL;  Surgeon: Lucilla Lame, MD;   Location: [place];  Service: Endoscopy;  Laterality: N/A;  . ESOPHAGOGASTRODUODENOSCOPY  11/2013   Dr Kennith Gain at [facility].  Normal study.  Duodenal bx negative for villous atrophy.   . ESOPHAGOGASTRODUODENOSCOPY N/A 12/09/2015   Procedure: ESOPHAGOGASTRODUODENOSCOPY (EGD);  Surgeon: Doran Stabler, MD;  Location: [facility] ENDOSCOPY;  Service: Endoscopy;  Laterality: N/A;  . ESOPHAGOGASTRODUODENOSCOPY (EGD) WITH PROPOFOL N/A 05/10/2017   Procedure: ESOPHAGOGASTRODUODENOSCOPY (EGD) WITH PROPOFOL;  Surgeon: Lucilla Lame, MD;  Location: [place];  Service: Endoscopy;  Laterality: N/A;  . KNEE SURGERY Left   . OOPHORECTOMY    . PORTA CATH INSERTION N/A 04/18/2017   Procedure: PORTA CATH INSERTION;  Surgeon: Katha Cabal, MD;  Location: [place] CV LAB;  Service: Cardiovascular;  Laterality: N/A;  . PORTA CATH INSERTION Right 05/21/2017   Procedure: PORTA CATH INSERTION;  Surgeon: Katha Cabal, MD;  Location: [place] [facility];  Service: Cardiovascular;  Laterality: Right;  . REPLACEMENT TOTAL KNEE Bilateral     Physical  Exam: GEN- CN 2-12 grossly intact and symmetric. EARS-  External canals clear, TMs intact, middle ear effusion with TM retraction FACE-  Tenderness at left TM joint NOSE-  Clear anteriorly OC/OP-  No masses or lesions, +mild trismus due to jaw pain NECK-  No LAD RESP- unlabored  A/P: No ear infection.  Patient has eustachian tube dysfunction and fluid in middle ear space but no infection.  Pain is from left temporomandibular joint dysfunction.  Recommend soft diet, warm compresses.  Follow up as outpatient for audiogram on non-urgent basis.   Adrienne Flores 06/09/2017 8:33 PM

## 2017-06-09 NOTE — Progress Notes (Signed)
[facility] Cardiology  SUBJECTIVE: Patient laying comfortably in bed, denies chest pain, shortness of breath, syncope   Vitals:   06/09/17 0915 06/09/17 0930 06/09/17 1000 06/09/17 1030  BP: 106/74 97/86 103/75 107/67  Pulse: (!) 58 61 (!) 59 (!) 57  Resp: 14 18 19 13   Temp:      TempSrc:      SpO2: 97% 94% 97% 96%  Weight:      Height:         Intake/Output Summary (Last 24 hours) at 06/09/2017 1115 Last data filed at 06/09/2017 1000 Gross per 24 hour  Intake 5057.32 ml  Output 3425 ml  Net 1632.32 ml      PHYSICAL EXAM  General: Well developed, well nourished, in no acute distress HEENT:  Normocephalic and atramatic Neck:  No JVD.  Lungs: Clear bilaterally to auscultation and percussion. Heart: HRRR . Normal S1 and S2 without gallops or murmurs.  Abdomen: Bowel sounds are positive, abdomen soft and non-tender  Msk:  Back normal, normal gait. Normal strength and tone for age. Extremities: No clubbing, cyanosis or edema.   Neuro: Alert and oriented X 3. Psych:  Good affect, responds appropriately   LABS: Basic Metabolic Panel: Recent Labs    06/08/17 0910 06/08/17 2125 06/09/17 0427  NA 134*  --  136  K 2.9* 3.7 4.0  CL 102  --  107  CO2 25  --  25  GLUCOSE 87  --  82  BUN 13  --  7  CREATININE 1.18*  --  0.95  CALCIUM 8.2*  --  7.7*  MG 2.0  --   --    Liver Function Tests: Recent Labs    06/08/17 0910 06/09/17 0427  AST 60* 50*  ALT 59* 50  ALKPHOS 87 85  BILITOT 0.5 0.7  PROT 6.7 6.8  ALBUMIN 3.4* 3.3*   No results for input(s): LIPASE, AMYLASE in the last 72 hours. CBC: Recent Labs    06/08/17 0910 06/09/17 0427  WBC 4.2 4.0  NEUTROABS 2.5  --   HGB 8.9* 9.9*  HCT 26.3* 28.4*  MCV 88.5 89.2  PLT 169 155   Cardiac Enzymes: Recent Labs    06/08/17 0910  CKTOTAL 910*  TROPONINI <0.03   BNP: Invalid input(s): POCBNP D-Dimer: No results for input(s): DDIMER in the last 72 hours. Hemoglobin A1C: No results for input(s): HGBA1C in the  last 72 hours. Fasting Lipid Panel: No results for input(s): CHOL, HDL, LDLCALC, TRIG, CHOLHDL, LDLDIRECT in the last 72 hours. Thyroid Function Tests: Recent Labs    06/08/17 1456  TSH 79.000*   Anemia Panel: Recent Labs    06/08/17 0910 06/08/17 1456 06/09/17 0427  VITAMINB12  --  287  --   FOLATE  --   --  5.5*  FERRITIN 513*  --   --   TIBC 285  --   --   IRON 119  --   --   RETICCTPCT  --   --  3.6*    US Venous Img Lower Unilateral Left  Result Date: 06/08/2017 CLINICAL DATA:  52 year old female with left lower leg pain EXAM: LEFT LOWER EXTREMITY VENOUS DOPPLER ULTRASOUND TECHNIQUE: Gray-scale sonography with graded compression, as well as color Doppler and duplex ultrasound were performed to evaluate the lower extremity deep venous systems from the level of the common femoral vein and including the common femoral, femoral, profunda femoral, popliteal and calf veins including the posterior tibial, peroneal and gastrocnemius veins when visible.  The superficial great saphenous vein was also interrogated. Spectral Doppler was utilized to evaluate flow at rest and with distal augmentation maneuvers in the common femoral, femoral and popliteal veins. COMPARISON:  None. FINDINGS: Contralateral Common Femoral Vein: Respiratory phasicity is normal and symmetric with the symptomatic side. No evidence of thrombus. Normal compressibility. Common Femoral Vein: No evidence of thrombus. Normal compressibility, respiratory phasicity and response to augmentation. Saphenofemoral Junction: No evidence of thrombus. Normal compressibility and flow on color Doppler imaging. Profunda Femoral Vein: No evidence of thrombus. Normal compressibility and flow on color Doppler imaging. Femoral Vein: No evidence of thrombus. Normal compressibility, respiratory phasicity and response to augmentation. Popliteal Vein: No evidence of thrombus. Normal compressibility, respiratory phasicity and response to augmentation.  Calf Veins: No evidence of thrombus. Normal compressibility and flow on color Doppler imaging. Superficial Great Saphenous Vein: No evidence of thrombus. Normal compressibility. Venous Reflux:  None. Other Findings:  None. IMPRESSION: No evidence of deep venous thrombosis. Electronically Signed   By: Jacqulynn Cadet M.D.   On: 06/08/2017 11:09   Dg Chest Port 1 View  Result Date: 06/08/2017 CLINICAL DATA:  Bradycardia. Hypotension. Lupus and rheumatoid arthritis. EXAM: PORTABLE CHEST 1 VIEW COMPARISON:  03/11/2017 FINDINGS: The heart size and mediastinal contours are within normal limits. Right-sided power port is seen in appropriate position. Both lungs are clear. The visualized skeletal structures are unremarkable. IMPRESSION: No active disease. Electronically Signed   By: Earle Gell M.D.   On: 06/08/2017 09:49     Echo pending  TELEMETRY: Sinus bradycardia at 57 bpm:  ASSESSMENT AND PLAN:  Principal Problem:   Symptomatic anemia Active Problems:   Rheumatoid arthritis (HCC)   Hypotension   Hypokalemia    1.  Sinus bradycardia at 57 bpm, of uncertain clinical significance, likely secondary to severe hypothyroidism; bradycardia will likely improve treatment of underlying hypothyroidism 2.  Severe hypothyroidism, TSH 79.00, on intravenous levothyroxine 3.  Hyperkalemia, corrected with potassium repletion 4.  Hypomagnesemia, space magnesium repletion 5.  Hypotension, stabilized 6.  Anemia  Recommendations  1.  Agree with current therapy 2.  No indication at this time for temporary or permanent pacemaker  Sign off for now, please call if any questions  Isaias Cowman, MD, PhD, [facility] 06/09/2017 11:15 AM

## 2017-06-09 NOTE — Progress Notes (Signed)
PULMONARY / CRITICAL CARE MEDICINE   Name: Adrienne Flores MRN: [id number] DOB: 11-06-1965    ADMISSION DATE:  06/08/2017 CONSULTATION DATE:  04/10/2017  REFERRING MD:  Dr Doy Hutching    HISTORY OF PRESENT ILLNESS:   Adrienne Flores a 52 y.o.femalehas a past medical history significant forRA and anemia with recent work up for SVC syndrome now with LLE pain and diffuse weakness and fatigue. In ER, Pt noted to be hypotensive and bradycardic. Also noted to have worsening anemia. Stool guaiac negative. Low BP persists despite IV fluids. She is now admitted.  She reports no nausea, vomiting, dizziness.  She was found to be bradycardic and hypotensive.  She was given IVF bolus and transferred to the ICU for management.   REVIEW OF SYSTEMS:   Left ear and Jaw pain otherwise negative  SUBJECTIVE:  Patient re[ports she feels much better, denies dysnoea, chest pain, abdominal pain, tooth pain.  VITAL SIGNS: BP 109/70   Pulse (!) 56   Temp 98.4 F (36.9 C) (Oral)   Resp 19   Ht 5\' 1"  (1.549 m)   Wt 166 lb 3.6 oz (75.4 kg)   SpO2 98%   BMI 31.41 kg/m   HEMODYNAMICS:  Intermittent hypotension  OXYGEN:  Room Air  INTAKE / OUTPUT: I/O last 3 completed shifts: In: 4277.3 [I.V.:2977.3; IV Piggyback:1300] Out: 2875 [Urine:2875]  PHYSICAL EXAMINATION: General:  Comfortable without distress Neuro:  AAOx3, No motor, deficits, decrease auditory sensory  HEENT:  PERRL, no jaw swelling, no lymphadenopathy, no JVD Cardiovascular:  Sinus bradycardia Lungs:  CTA Abdomen:  Soft NT, +BS Musculoskeletal:  No deformities Skin:  Warm and dry  LABS:  BMET Recent Labs  Lab 06/08/17 0910 06/08/17 2125 06/09/17 0427  NA 134*  --  136  K 2.9* 3.7 4.0  CL 102  --  107  CO2 25  --  25  BUN 13  --  7  CREATININE 1.18*  --  0.95  GLUCOSE 87  --  82    Electrolytes Recent Labs  Lab 06/08/17 0910 06/09/17 0427  CALCIUM 8.2* 7.7*  MG 2.0  --     CBC Recent Labs  Lab  06/08/17 0910 06/09/17 0427  WBC 4.2 4.0  HGB 8.9* 9.9*  HCT 26.3* 28.4*  PLT 169 155    Coag's No results for input(s): APTT, INR in the last 168 hours.  Sepsis Markers Recent Labs  Lab 06/08/17 0910 06/09/17 0427  LATICACIDVEN 1.1 1.1    ABG No results for input(s): PHART, PCO2ART, PO2ART in the last 168 hours.  Liver Enzymes Recent Labs  Lab 06/08/17 0910 06/09/17 0427  AST 60* 50*  ALT 59* 50  ALKPHOS 87 85  BILITOT 0.5 0.7  ALBUMIN 3.4* 3.3*    Cardiac Enzymes Recent Labs  Lab 06/08/17 0910  TROPONINI <0.03    Glucose Recent Labs  Lab 06/08/17 1453 06/09/17 1605  GLUCAP 73 69    Imaging No results found.   STUDIES:  4/28 2D ECHO  CULTURES: 4/28 MRSA PCR Negative  ANTIBIOTICS: 4/27 Augmentin  SIGNIFICANT EVENTS: 4/27 Admission, symptomatic bradycardia  LINES/TUBES: Peripheral, Porta cath  DISCUSSION: This 52 year old lady was admitted with symptomatic bradycardia and was found to be severely hypothyroid.  ASSESSMENT / PLAN: 1. Bradycardia likely secondary to severe hypothyroidism 2. Severe Hypothyroidism from non compliance with medical therapy 3. Adrenal Insufficiency 4. Chronic Anemia- multi factorial.  Now being W/U for MM 5. Ear infection ( no dental issues) 6. Systolic  hypotension likely from adrenal insufficiency. Now on low dose Levophed.  Hope to wean off now that Hydrocortisone has been started. Plan:  1. Continue IV Levothyroxine 2. Start hydrocotisone 3. Patient Flores benefit from Endocrinology consult. ? MRI ? hypopituittarism 4. Continue on ABX 5. ENT consult in Am 6. GI and DVT 7. Pain control 8. Target glucose management     FAMILY  - Updates: Husband and patient updated   Cammie Sickle, MD Pulmonary and [place] Pager: [phone]  06/09/2017, 4:32 PM

## 2017-06-09 NOTE — Progress Notes (Signed)
24 hour urine start time 4/28 at 0000.

## 2017-06-09 NOTE — Consult Note (Signed)
[facility]  Date of admission:  06/08/2017  Inpatient day:  06/09/2017  Consulting physician:  Dr Fulton Reek   Reason for Consultation:  Anemia  Chief Complaint: Adrienne Flores is a 52 y.o. female with rheumatoid arthritis and chronic anemia who was admitted through the emergency room to the ICU with symptomatic anemia, bradycardia, and hypotension.  HPI: She has been followed in the hematology clinic by Dr. Janese Banks.  She was initially seen for leukopenia which resolved.  She is felt to have a normocytic anemia likely due to chronic disease.  She has had elevated TSH and low T3 for which she was started on thyroid medications.  She has had a low folate level in the past.  She has not been on folic acid for 6 months.  She was last seen in the hematology clinic on 05/13/2017.  At that time CBC revealed a hematocrit of 31.6, hemoglobin 10.6, platelets 235,000, white count 5200.  Creatinine was 1.24 creatinine.  Protein was elevated at 9.2.  Notes indicate that she has been to the emergency room for abdominal pain, nausea, vomiting, andconstipation.  She is followed in the [facility] [place] clinic.  He has had EGD, colonoscopies and multiple CT scans but no clear etiology of her abdominal pain.  She has rheumatoid arthritis.  She was previously treated with methotrexate but could not tolerate it.  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  This has been monitored.  She states that her diet is good.  She eats meat daily as well as green leafy vegetables.  She denies ice pica.  She craves "a good meal".  She states that 2 weeks ago she had a double ear infection.  Right ear was draining yellow material with some blood.  She was put on a "strong antibiotic".  Her throat was also hurting with difficulty talking.  She states that she has had 3 or 4 infections in the past year.  She describes diverticulitis and colitis.  She denies any pneumonia, sinus  infection or skin infections.  She had a bladder infection about 2 to 3 months ago.  Symptomatically, she notes a significant weight loss.  She states that her weight previously was 260 pounds and went down to 167 pounds.  She reported a 40 pound weight loss in the past year to Dr Janese Banks at her last visit.  However, weight on 07/02/2016 was 149 pounds.  Appetite has been poor.  She states she eats once a day.  Recently her weight has improved.  No mammogram in "awhile".  Past Medical History:  Diagnosis Date  . Anemia   . At high risk for falls   . Chronic abdominal pain 2008   dates back to at least 2008.  multiple ultrasound, CT scans, x rays at [facility], [place].  [facility] colonoscopy, egd unrevealing.    . Colitis 2015   ? colitis per CTs in 2015, 2016 and 11/2015.  presumed infectious.  colonoscopy in 2015 negative for colitis.   . Constipation   . Cyclic vomiting syndrome 2015   per opinion  of Dr Granville Lewis, IBS specialist at [facility].   . Degenerative disc disease   . Depression   . Fibromyalgia   . Gastroparesis 03/2011   markedly abnormal gastric emptying study.   . Hematemesis 11/2015   mild to moderate blood in emesis appeared after 2 plus weeks of frequent, non-bloody emesis.   [name] Hemorrhoids   .  Hemorrhoids   . History of chicken pox   . Hypokalemia   . Hypothyroid   . Lupus ([place])   . 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 Rheumatoid arthritis [facility])     Past Surgical History:  Procedure Laterality Date  . ABDOMINAL HYSTERECTOMY     total  . CESAREAN SECTION     x2  . CHOLECYSTECTOMY    . COLONOSCOPY  11/2013   at [facility] Dr Kennith Gain. Hemorrhoids. sigmoid tics. random biopsies negative for microscopic colitis or any pathology.   . COLONOSCOPY WITH PROPOFOL N/A 05/10/2017   Procedure: COLONOSCOPY WITH PROPOFOL;  Surgeon: Lucilla Lame, MD;  Location: [place];  Service: Endoscopy;  Laterality: N/A;  . ESOPHAGOGASTRODUODENOSCOPY  11/2013   Dr Kennith Gain at [facility].  Normal study.  Duodenal  bx negative for villous atrophy.   . ESOPHAGOGASTRODUODENOSCOPY N/A 12/09/2015   Procedure: ESOPHAGOGASTRODUODENOSCOPY (EGD);  Surgeon: Doran Stabler, MD;  Location: [facility] ENDOSCOPY;  Service: Endoscopy;  Laterality: N/A;  . ESOPHAGOGASTRODUODENOSCOPY (EGD) WITH PROPOFOL N/A 05/10/2017   Procedure: ESOPHAGOGASTRODUODENOSCOPY (EGD) WITH PROPOFOL;  Surgeon: Lucilla Lame, MD;  Location: [place];  Service: Endoscopy;  Laterality: N/A;  . KNEE SURGERY Left   . OOPHORECTOMY    . PORTA CATH INSERTION N/A 04/18/2017   Procedure: PORTA CATH INSERTION;  Surgeon: Katha Cabal, MD;  Location: [place] CV [facility];  Service: Cardiovascular;  Laterality: N/A;  . PORTA CATH INSERTION Right 05/21/2017   Procedure: PORTA CATH INSERTION;  Surgeon: Katha Cabal, MD;  Location: [place] [facility];  Service: Cardiovascular;  Laterality: Right;  . REPLACEMENT TOTAL KNEE Bilateral     Family History  Problem Relation Age of Onset  . Leukemia Mother   . Arthritis Mother   . Early death Mother   . Miscarriages / [place] Mother   . Hypothyroidism Sister   . Cancer Paternal Grandmother   . Cancer Paternal Grandfather   . Hypothyroidism Brother     Social History:  reports that she has never smoked. She has never used smokeless tobacco. She reports that she does not drink alcohol or use drugs.  She denies any exposure to radiation or toxins.  She is alone today.  Allergies:  Allergies  Allergen Reactions  . Morphine And Related Hives and Itching    ONLY MORPHINE, tolerates dilaudid, oxycodone, and norco ONLY MORPHINE, tolerates dilaudid, oxycodone, and norco  . Methotrexate Derivatives Other (See Comments)    Headaches   . Relafen [Nabumetone] Other (See Comments)    Headaches  . Haloperidol Nausea Only and Other (See Comments)    Patient stated that she broke out in cold sweats     Medications Prior to Admission  Medication Sig Dispense Refill  . amoxicillin-clavulanate  (AUGMENTIN) 875-125 MG tablet Take 1 tablet by mouth 2 (two) times daily for 7 days. 14 tablet 0  . gabapentin (NEURONTIN) 600 MG tablet Take 600 mg by mouth 2 (two) times daily.     [name] levothyroxine (SYNTHROID, LEVOTHROID) 137 MCG tablet Take 137 mcg by mouth at bedtime.     . Carboxymethylcellul-Glycerin (LUBRICATING EYE DROPS OP) Place 1 drop into both eyes daily as needed (allergies).    . diphenhydrAMINE-APAP, sleep, (EXCEDRIN PM) 38-500 MG TABS Take 1 tablet by mouth at bedtime as needed (sleep).    . diphenoxylate-atropine (LOMOTIL) 2.5-0.025 MG tablet Take 1 tablet by mouth 4 (four) times daily as needed for diarrhea or loose stools. (Patient not taking: Reported on 04/16/2017) 30 tablet 1  .  HYDROcodone-acetaminophen (NORCO) 5-325 MG tablet Take 1 tablet by mouth every 6 (six) hours as needed for up to 7 doses for severe pain. (Patient not taking: Reported on 04/16/2017) 7 tablet 0  . naproxen sodium (ALEVE) 220 MG tablet Take 220 mg by mouth 2 (two) times daily as needed (pain).    . ondansetron (ZOFRAN ODT) 4 MG disintegrating tablet Take 1 tablet (4 mg total) by mouth every 8 (eight) hours as needed. (Patient not taking: Reported on 05/13/2017) 10 tablet 0  . tiZANidine (ZANAFLEX) 4 MG tablet Take 1 tablet by mouth 3 (three) times daily.  1  . traMADol (ULTRAM) 50 MG tablet Take 1 tablet (50 mg total) by mouth every 6 (six) hours as needed. (Patient not taking: Reported on 05/13/2017) 10 tablet 0    Review of Systems: GENERAL:  Fatigue.  No fevers, sweats.  Weight loss of 40 pounds in past year (improved per chart records). PERFORMANCE STATUS (ECOG):  1 HEENT:  Runny nose.  Sore throat, improving.  No visual changes, mouth sores or tenderness. Lungs: Shortness of breath.  Cough productive of yellow sputum.  No hemoptysis. Cardiac:  No chest pain, palpitations, orthopnea, or PND. GI:  Poor appetite.  h/o colitis and diverticulitis.  Lower abdominal discomfort.  No nausea, vomiting, diarrhea,  constipation, melena or hematochezia. GU:  Urinary frequency with IVF.  No urgency, dysuria, or hematuria. Musculoskeletal:  No back pain.  No joint pain.  No muscle tenderness. Extremities:  No pain or swelling. Skin:  Hair falling out.  No rashes or skin changes. Neuro:  Temporal headaches.  No numbness or weakness, balance or coordination issues. Endocrine:  No diabetes.  Thyroid  Disease on Synthroid.  No hot flashes or night sweats. Psych:  No mood changes, depression or anxiety. Pain:  No focal pain. Review of systems:  All other systems reviewed and found to be negative.  Physical Exam:  Blood pressure 107/67, pulse (!) 57, temperature 97.6 F (36.4 C), temperature source Oral, resp. rate 13, height 5' 1"  (1.549 m), weight 166 lb 3.6 oz (75.4 kg), SpO2 96 %.  GENERAL:  Well developed, well nourished, woman sitting up comfortably in the medical ICU in no acute distress. MENTAL STATUS:  Alert and oriented to person, place and time. HEAD:  Crista Elliot a [facility] baseball cap.  Brown hair.  Normocephalic, atraumatic, face symmetric, no Cushingoid features. EYES:  Pupils equal round and reactive to light and accomodation.  No conjunctivitis or scleral icterus. ENT:  Hoarse.  Oropharynx clear without lesion.  Tongue normal.  No upper teeth.  Mucous membranes moist.  RESPIRATORY:  Clear to auscultation without rales, wheezes or rhonchi. CARDIOVASCULAR:  Regular rate and rhythm without murmur, rub or gallop. ABDOMEN:  Soft, slightly tender lower abdomen without guarding or rebound tenderness. Active bowel sounds, and no appreciable hepatosplenomegaly.  No masses. SKIN:  Bilateral well healed knee incisions.  No rashes, ulcers or lesions. EXTREMITIES: ICDs in place.  No edema, no skin discoloration or tenderness.  No palpable cords. LYMPH NODES: 2 cm left axillary adenopathy.  No palpable cervical, supraclavicular, or inguinal adenopathy  NEUROLOGICAL: Unremarkable. PSYCH:  Appropriate.   Results  for orders placed or performed during the hospital encounter of 06/08/17 (from the past 48 hour(s))  Comprehensive metabolic panel     Status: Abnormal   Collection Time: 06/08/17  9:10 AM  Result Value Ref Range   Sodium 134 (L) 135 - 145 mmol/L   Potassium 2.9 (L) 3.5 - 5.1 mmol/L  Chloride 102 101 - 111 mmol/L   CO2 25 22 - 32 mmol/L   Glucose, Bld 87 65 - 99 mg/dL   BUN 13 6 - 20 mg/dL   Creatinine, Ser 1.18 (H) 0.44 - 1.00 mg/dL   Calcium 8.2 (L) 8.9 - 10.3 mg/dL   Total Protein 6.7 6.5 - 8.1 g/dL   Albumin 3.4 (L) 3.5 - 5.0 g/dL   AST 60 (H) 15 - 41 U/L   ALT 59 (H) 14 - 54 U/L   Alkaline Phosphatase 87 38 - 126 U/L   Total Bilirubin 0.5 0.3 - 1.2 mg/dL   GFR calc non Af Amer 51 (L) >60 mL/min   GFR calc Af Amer 59 (L) >60 mL/min    Comment: (NOTE) The eGFR has been calculated using the CKD EPI equation. This calculation has not been validated in all clinical situations. eGFR's persistently <60 mL/min signify possible Chronic Kidney Disease.    Anion gap 7 5 - 15    Comment: Performed at [facility], [address] [place], [place] [address]  CBC with Differential     Status: Abnormal   Collection Time: 06/08/17  9:10 AM  Result Value Ref Range   WBC 4.2 3.6 - 11.0 K/uL   RBC 2.98 (L) 3.80 - 5.20 MIL/uL   Hemoglobin 8.9 (L) 12.0 - 16.0 g/dL   HCT 26.3 (L) 35.0 - 47.0 %   MCV 88.5 80.0 - 100.0 fL   MCH 29.7 26.0 - 34.0 pg   MCHC 33.6 32.0 - 36.0 g/dL   RDW 18.2 (H) 11.5 - 14.5 %   Platelets 169 150 - 440 K/uL   Neutrophils Relative % 59 %   Neutro Abs 2.5 1.4 - 6.5 K/uL   Lymphocytes Relative 32 %   Lymphs Abs 1.3 1.0 - 3.6 K/uL   Monocytes Relative 6 %   Monocytes Absolute 0.2 0.2 - 0.9 K/uL   Eosinophils Relative 2 %   Eosinophils Absolute 0.1 0 - 0.7 K/uL   Basophils Relative 1 %   Basophils Absolute 0.0 0 - 0.1 K/uL    Comment: Performed at [facility], [address] [place], [place] [address]  Troponin I     Status: None    Collection Time: 06/08/17  9:10 AM  Result Value Ref Range   Troponin I <0.03 <0.03 ng/mL    Comment: Performed at [facility], [address] [place], [place] [address]  Lactic acid, plasma     Status: None   Collection Time: 06/08/17  9:10 AM  Result Value Ref Range   Lactic Acid, Venous 1.1 0.5 - 1.9 mmol/L    Comment: Performed at [facility], [address] [place], [place] [address]  CK     Status: Abnormal   Collection Time: 06/08/17  9:10 AM  Result Value Ref Range   Total CK 910 (H) 38 - 234 U/L    Comment: Performed at [facility], [address] [place], [place] [address]  Sedimentation rate     Status: Abnormal   Collection Time: 06/08/17  9:10 AM  Result Value Ref Range   Sed Rate 65 (H) 0 - 30 mm/hr    Comment: Performed at [facility], [address] [place], [place] [address]  Iron and TIBC     Status: Abnormal   Collection Time: 06/08/17  9:10 AM  Result Value Ref Range   Iron 119 28 - 170 ug/dL   TIBC 285  250 - 450 ug/dL   Saturation Ratios 42 (H) 10.4 - 31.8 %   UIBC 167 ug/dL    Comment: Performed at [facility], [address] [place], [place] [address]  Ferritin     Status: Abnormal   Collection Time: 06/08/17  9:10 AM  Result Value Ref Range   Ferritin 513 (H) 11 - 307 ng/mL    Comment: Performed at [facility], [address] [place], [place] [address]  Magnesium     Status: None   Collection Time: 06/08/17  9:10 AM  Result Value Ref Range   Magnesium 2.0 1.7 - 2.4 mg/dL    Comment: Performed at [facility], [address] [place], [place] [address]  Glucose, capillary     Status: None   Collection Time: 06/08/17  2:53 PM  Result Value Ref Range   Glucose-Capillary 73 65 - 99 mg/dL   Comment 1 Document in Chart   Vitamin B12     Status: None   Collection Time: 06/08/17  2:56 PM  Result Value Ref Range   Vitamin B-12 287 180 - 914 pg/mL    Comment: (NOTE) This  assay is not validated for testing neonatal or myeloproliferative syndrome specimens for Vitamin B12 levels. Performed at [facility], [place] [address], [place], [place] [address]   TSH     Status: Abnormal   Collection Time: 06/08/17  2:56 PM  Result Value Ref Range   TSH 79.000 (H) 0.350 - 4.500 uIU/mL    Comment: Performed by a 3rd Generation assay with a functional sensitivity of <=0.01 uIU/mL. Performed at [facility], [address] [place], [place] [address]   MRSA PCR Screening     Status: None   Collection Time: 06/08/17  2:56 PM  Result Value Ref Range   MRSA by PCR NEGATIVE NEGATIVE    Comment:        The GeneXpert MRSA Assay (FDA approved for NASAL specimens only), is one component of a comprehensive MRSA colonization surveillance program. It is not intended to diagnose MRSA infection nor to guide or monitor treatment for MRSA infections. Performed at [facility] [address] [place], [place] [address]   Urinalysis, Complete w Microscopic     Status: Abnormal   Collection Time: 06/08/17  3:46 PM  Result Value Ref Range   Color, Urine YELLOW (A) YELLOW   APPearance CLEAR (A) CLEAR   Specific Gravity, Urine 1.011 1.005 - 1.030   pH 6.0 5.0 - 8.0   Glucose, UA NEGATIVE NEGATIVE mg/dL   Hgb urine dipstick NEGATIVE NEGATIVE   Bilirubin Urine NEGATIVE NEGATIVE   Ketones, ur NEGATIVE NEGATIVE mg/dL   Protein, ur NEGATIVE NEGATIVE mg/dL   Nitrite NEGATIVE NEGATIVE   Leukocytes, UA NEGATIVE NEGATIVE   RBC / HPF 0-5 0 - 5 RBC/hpf   WBC, UA 0-5 0 - 5 WBC/hpf   Bacteria, UA NONE SEEN NONE SEEN   Squamous Epithelial / LPF 6-10 0 - 5    Comment: Please note change in reference range.   Mucus PRESENT     Comment: Performed at [facility], [address] [place], [place] [address]  Potassium     Status: None   Collection Time: 06/08/17  9:25 PM  Result Value Ref Range   Potassium 3.7 3.5 - 5.1 mmol/L    Comment: Performed at  [facility], [address] [place], [place] [address]  Comprehensive metabolic panel  Status: Abnormal   Collection Time: 06/09/17  4:27 AM  Result Value Ref Range   Sodium 136 135 - 145 mmol/L   Potassium 4.0 3.5 - 5.1 mmol/L   Chloride 107 101 - 111 mmol/L   CO2 25 22 - 32 mmol/L   Glucose, Bld 82 65 - 99 mg/dL   BUN 7 6 - 20 mg/dL   Creatinine, Ser 0.95 0.44 - 1.00 mg/dL   Calcium 7.7 (L) 8.9 - 10.3 mg/dL   Total Protein 6.8 6.5 - 8.1 g/dL   Albumin 3.3 (L) 3.5 - 5.0 g/dL   AST 50 (H) 15 - 41 U/L   ALT 50 14 - 54 U/L   Alkaline Phosphatase 85 38 - 126 U/L   Total Bilirubin 0.7 0.3 - 1.2 mg/dL   GFR calc non Af Amer >60 >60 mL/min   GFR calc Af Amer >60 >60 mL/min    Comment: (NOTE) The eGFR has been calculated using the CKD EPI equation. This calculation has not been validated in all clinical situations. eGFR's persistently <60 mL/min signify possible Chronic Kidney Disease.    Anion gap 4 (L) 5 - 15    Comment: Performed at [facility], [address] [place], [place] [address]  CBC     Status: Abnormal   Collection Time: 06/09/17  4:27 AM  Result Value Ref Range   WBC 4.0 3.6 - 11.0 K/uL   RBC 3.19 (L) 3.80 - 5.20 MIL/uL   Hemoglobin 9.9 (L) 12.0 - 16.0 g/dL   HCT 28.4 (L) 35.0 - 47.0 %   MCV 89.2 80.0 - 100.0 fL   MCH 31.1 26.0 - 34.0 pg   MCHC 34.8 32.0 - 36.0 g/dL   RDW 18.3 (H) 11.5 - 14.5 %   Platelets 155 150 - 440 K/uL    Comment: Performed at [facility], [address] [place], [place] [address]  Cortisol-am, blood     Status: None   Collection Time: 06/09/17  4:27 AM  Result Value Ref Range   Cortisol - AM 11.0 6.7 - 22.6 ug/dL    Comment: Performed at [place] [address], [place], [place] [address]  Lactic acid, plasma     Status: None   Collection Time: 06/09/17  4:27 AM  Result Value Ref Range   Lactic Acid, Venous 1.1 0.5 - 1.9 mmol/L    Comment: Performed at [facility], [address] [place], [place] [address]  Folate     Status: Abnormal   Collection Time: 06/09/17  4:27 AM  Result Value Ref Range   Folate 5.5 (L) >5.9 ng/mL    Comment: Performed at [facility], [address] [place], [place] [address]  Reticulocytes     Status: Abnormal   Collection Time: 06/09/17  4:27 AM  Result Value Ref Range   Retic Ct Pct 3.6 (H) 0.4 - 3.1 %   RBC. 3.19 (L) 3.80 - 5.20 MIL/uL   Retic Count, Absolute 114.8 19.0 - 183.0 K/uL    Comment: Performed at [facility], [address], [place], [place] [address]  DAT, polyspecific, [facility] only)     Status: None   Collection Time: 06/09/17  4:27 AM  Result Value Ref Range   Polyspecific [facility] test      NEG Performed at [facility], [place], [place] [address]    US Venous Img Lower Unilateral Left  Result Date: 06/08/2017 CLINICAL DATA:  52 year old female with left lower leg pain EXAM: LEFT LOWER EXTREMITY VENOUS DOPPLER ULTRASOUND TECHNIQUE: Gray-scale sonography with graded compression, as well as color Doppler and duplex ultrasound were performed to evaluate the lower extremity deep venous systems from the level of the common femoral vein and including the common femoral, femoral, profunda femoral, popliteal and calf veins including the posterior tibial, peroneal and gastrocnemius veins when visible. The superficial great saphenous vein was also interrogated. Spectral Doppler was utilized to evaluate flow at rest and with distal augmentation maneuvers in the common femoral, femoral and popliteal veins. COMPARISON:  None. FINDINGS: Contralateral Common Femoral Vein: Respiratory phasicity is normal and symmetric with the symptomatic side. No evidence of thrombus. Normal compressibility. Common Femoral Vein: No evidence of thrombus. Normal compressibility, respiratory phasicity and response to augmentation. Saphenofemoral Junction: No evidence of thrombus. Normal compressibility and flow  on color Doppler imaging. Profunda Femoral Vein: No evidence of thrombus. Normal compressibility and flow on color Doppler imaging. Femoral Vein: No evidence of thrombus. Normal compressibility, respiratory phasicity and response to augmentation. Popliteal Vein: No evidence of thrombus. Normal compressibility, respiratory phasicity and response to augmentation. Calf Veins: No evidence of thrombus. Normal compressibility and flow on color Doppler imaging. Superficial Great Saphenous Vein: No evidence of thrombus. Normal compressibility. Venous Reflux:  None. Other Findings:  None. IMPRESSION: No evidence of deep venous thrombosis. Electronically Signed   By: Jacqulynn Cadet M.D.   On: 06/08/2017 11:09   Dg Chest Port 1 View  Result Date: 06/08/2017 CLINICAL DATA:  Bradycardia. Hypotension. Lupus and rheumatoid arthritis. EXAM: PORTABLE CHEST 1 VIEW COMPARISON:  03/11/2017 FINDINGS: The heart size and mediastinal contours are within normal limits. Right-sided power port is seen in appropriate position. Both lungs are clear. The visualized skeletal structures are unremarkable. IMPRESSION: No active disease. Electronically Signed   By: Earle Gell M.D.   On: 06/08/2017 09:49    Assessment:  The patient is a 52 y.o.  woman with rheumatoid arthritis and a history of anemia felt secondary to chronic disease who was admitted with symptomatic anemia, bradycardia and hypotension.  Baseline hemoglobin is 10-11.  Stool is guaiac negative.  She has hypothyroidism and a history of non-compliance with her thyroid medications.  She has a history of folate deficiency (none taken in 6 months).  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  Symptomatically, she notes weight loss.  She has chronic abdominal symptoms.  Exam reveals left axillary adenopathy.   Plan:   1.  Hematology:  Hemoglobin has improved from 8.9 yesterday and 9.9 today.  No evidence of bleeding.  Labs ordered (retic, DAT, folate,  MMA, myeloma panel, free light chains, ANA with reflex, and 24 hour urine for UPEP and free light chains).  Coombs is negative.  Retic is 3.6% (appropriate marrow response).  Folate is low.  Restart folic acid 1 mg a day. B12 was 287 (borderline) on 06/08/2017.  If MMA is elevated would begin B12 supplementation.  Work-up for myeloma ordered.  No detectable M-spike back on 07/02/2016 although immunofixation was positive.  Discuss plan for outpatient mammogram.   Thank you for allowing me to participate in JAMIKA SADEK 's care.  I will follow her closely with you while hospitalized until Dr Elroy Channel return on 06/10/2017.   Lequita Asal, MD  06/09/2017, 12:05 PM

## 2017-06-10 ENCOUNTER — Encounter: Payer: Self-pay | Admitting: *Deleted

## 2017-06-10 LAB — KAPPA/LAMBDA LIGHT CHAINS
Kappa free light chain: 29 mg/L — ABNORMAL HIGH (ref 3.3–19.4)
Kappa, lambda light chain ratio: 1.68 — ABNORMAL HIGH (ref 0.26–1.65)
Lambda free light chains: 17.3 mg/L (ref 5.7–26.3)

## 2017-06-10 LAB — MAGNESIUM: Magnesium: 1.9 mg/dL (ref 1.7–2.4)

## 2017-06-10 LAB — ECHOCARDIOGRAM COMPLETE
Height: 61 in
WEIGHTICAEL: 2659.63 [oz_av]

## 2017-06-10 LAB — BASIC METABOLIC PANEL
Anion gap: 7 (ref 5–15)
BUN: 12 mg/dL (ref 6–20)
CHLORIDE: 105 mmol/L (ref 101–111)
CO2: 22 mmol/L (ref 22–32)
CREATININE: 1.03 mg/dL — AB (ref 0.44–1.00)
Calcium: 8 mg/dL — ABNORMAL LOW (ref 8.9–10.3)
GFR calc non Af Amer: >60 mL/min (ref >60)
Glucose, Bld: 164 mg/dL — ABNORMAL HIGH (ref 65–99)
POTASSIUM: 3.9 mmol/L (ref 3.5–5.1)
Sodium: 134 mmol/L — ABNORMAL LOW (ref 135–145)

## 2017-06-10 LAB — GLUCOSE, CAPILLARY
Glucose-Capillary: 125 mg/dL — ABNORMAL HIGH (ref 65–99)
Glucose-Capillary: 146 mg/dL — ABNORMAL HIGH (ref 65–99)
Glucose-Capillary: 136 mg/dL — ABNORMAL HIGH (ref 65–99)
Glucose-Capillary: 150 mg/dL — ABNORMAL HIGH (ref 65–99)

## 2017-06-10 LAB — HIV ANTIBODY (ROUTINE TESTING W REFLEX): HIV SCREEN 4TH GENERATION: NONREACTIVE

## 2017-06-10 LAB — CBC
HEMATOCRIT: 26.9 % — AB (ref 35.0–47.0)
Hemoglobin: 9.2 g/dL — ABNORMAL LOW (ref 12.0–16.0)
MCH: 30.7 pg (ref 26.0–34.0)
MCHC: 34.2 g/dL (ref 32.0–36.0)
MCV: 89.6 fL (ref 80.0–100.0)
Platelets: 165 10*3/uL (ref 150–440)
RBC: 3 MIL/uL — ABNORMAL LOW (ref 3.80–5.20)
RDW: 19.1 % — AB (ref 11.5–14.5)
WBC: 5.7 10*3/uL (ref 3.6–11.0)

## 2017-06-10 LAB — PHOSPHORUS: Phosphorus: 2.5 mg/dL (ref 2.5–4.6)

## 2017-06-10 LAB — T4, FREE: Free T4: 0.38 ng/dL — ABNORMAL LOW (ref 0.82–1.77)

## 2017-06-10 MED ORDER — SODIUM CHLORIDE 0.9 % IV BOLUS
500.0000 mL | Freq: Once | INTRAVENOUS | Status: AC
  Administered 2017-06-10: 500 mL via INTRAVENOUS

## 2017-06-10 MED ORDER — SODIUM CHLORIDE 0.9% FLUSH: 10.0000 mL | INTRAVENOUS | Status: DC | PRN

## 2017-06-10 MED ORDER — LEVOTHYROXINE SODIUM 50 MCG PO TABS
150.0000 ug | ORAL_TABLET | Freq: Every day | ORAL | Status: DC
  Administered 2017-06-11 – 2017-06-12 (×2): 150 ug via ORAL
  Filled 2017-06-10: qty 1

## 2017-06-10 MED ORDER — HYDROCORTISONE NA SUCCINATE PF 100 MG IJ SOLR
50.0000 mg | Freq: Three times a day (TID) | INTRAMUSCULAR | Status: DC
  Administered 2017-06-10 – 2017-06-11 (×3): 50 mg via INTRAVENOUS
  Filled 2017-06-10 (×3): qty 2

## 2017-06-10 MED ORDER — FAMOTIDINE 20 MG PO TABS
20.0000 mg | ORAL_TABLET | Freq: Two times a day (BID) | ORAL | Status: DC
  Administered 2017-06-10 – 2017-06-12 (×4): 20 mg via ORAL
  Filled 2017-06-10 (×4): qty 1

## 2017-06-10 MED ORDER — PREMIER PROTEIN SHAKE
11.0000 [oz_av] | Freq: Two times a day (BID) | ORAL | Status: DC
  Administered 2017-06-10 – 2017-06-11 (×3): 11 [oz_av] via ORAL

## 2017-06-10 NOTE — Progress Notes (Signed)
[facility] [place] NAME: Adrienne Flores    MR#:  [id number]  DATE OF BIRTH:  1965/09/04  SUBJECTIVE:  CHIEF COMPLAINT:   Chief Complaint  Patient presents with  . Leg Pain  weak, hypotensive REVIEW OF SYSTEMS:  CONSTITUTIONAL: No fever, fatigue or weakness.  EYES: No blurred or double vision.  EARS, NOSE, AND THROAT: No tinnitus or ear pain.  RESPIRATORY: No cough, shortness of breath, wheezing or hemoptysis.  CARDIOVASCULAR: No chest pain, orthopnea, edema.  GASTROINTESTINAL: No nausea, vomiting, diarrhea or abdominal pain.  GENITOURINARY: No dysuria, hematuria.  ENDOCRINE: No polyuria, nocturia,  HEMATOLOGY: No anemia, easy bruising or bleeding SKIN: No rash or lesion. MUSCULOSKELETAL: No joint pain or arthritis.   NEUROLOGIC: No tingling, numbness, weakness.  PSYCHIATRY: No anxiety or depression.   ROS  DRUG ALLERGIES:   Allergies  Allergen Reactions  . Morphine And Related Hives and Itching    ONLY MORPHINE, tolerates dilaudid, oxycodone, and norco ONLY MORPHINE, tolerates dilaudid, oxycodone, and norco  . Methotrexate Derivatives Other (See Comments)    Headaches   . Relafen [Nabumetone] Other (See Comments)    Headaches  . Haloperidol Nausea Only and Other (See Comments)    Patient stated that she broke out in cold sweats     VITALS:  Blood pressure 90/66, pulse (!) 56, temperature 98.2 F (36.8 C), temperature source Oral, resp. rate 12, height 5\' 1"  (1.549 m), weight 77.5 kg (170 lb 13.7 oz), SpO2 94 %.  PHYSICAL EXAMINATION:  GENERAL:  52 y.o.-year-old patient lying in the bed with no acute distress.  EYES: Pupils equal, round, reactive to light and accommodation. No scleral icterus. Extraocular muscles intact.  HEENT: Head atraumatic, normocephalic. Oropharynx and nasopharynx clear.  NECK:  Supple, no jugular venous distention. No thyroid enlargement, no tenderness.  LUNGS: Normal breath sounds bilaterally,  no wheezing, rales,rhonchi or crepitation. No use of accessory muscles of respiration.  CARDIOVASCULAR: S1, S2 normal. No murmurs, rubs, or gallops.  ABDOMEN: Soft, nontender, nondistended. Bowel sounds present. No organomegaly or mass.  EXTREMITIES: No pedal edema, cyanosis, or clubbing.  NEUROLOGIC: Cranial nerves II through XII are intact. Muscle strength 5/5 in all extremities. Sensation intact. Gait not checked.  PSYCHIATRIC: The patient is alert and oriented x 3.  SKIN: No obvious rash, lesion, or ulcer.   Physical Exam LABORATORY PANEL:   CBC Recent Labs  Lab 06/10/17 0511  WBC 5.7  HGB 9.2*  HCT 26.9*  PLT 165   ------------------------------------------------------------------------------------------------------------------  Chemistries  Recent Labs  Lab 06/09/17 0427 06/10/17 0511  NA 136 134*  K 4.0 3.9  CL 107 105  CO2 25 22  GLUCOSE 82 164*  BUN 7 12  CREATININE 0.95 1.03*  CALCIUM 7.7* 8.0*  MG  --  1.9  AST 50*  --   ALT 50  --   ALKPHOS 85  --   BILITOT 0.7  --    ------------------------------------------------------------------------------------------------------------------  Cardiac Enzymes Recent Labs  Lab 06/08/17 0910  TROPONINI <0.03   ------------------------------------------------------------------------------------------------------------------  RADIOLOGY:  No results found.  ASSESSMENT AND PLAN:  1 acute sinus bradycardia Secondary to severe hypothyroidism Did require dopamine drip overnight Cardiology input appreciated  2 acute hypokalemia  Repleted   3 acute hypotension  Most likely secondary to severe hypothyroidism and bradycardia  Did require dopamine drip overnight   4 acute severe hypothyroidism Continue IV Synthroid  5 acute on chronic anemia Stable  6. Lt. temporomandibular joint dysfunction and no  ear infection as per ENT. -D/C ABX and follow up as an out patient  Remains in ICU  All the records are  reviewed and case discussed with Care Management/Social Workerr. Management plans discussed with the patient, family and they are in agreement.  CODE STATUS: full  TOTAL TIME TAKING CARE OF THIS PATIENT: 35 minutes.     POSSIBLE D/C IN 2-3 DAYS, DEPENDING ON CLINICAL CONDITION.   Max Sane M.D on 06/10/2017   Between 7am to 6pm - Pager - [phone]  After 6pm go to [email or web address] - password [email or web address] [facility]  Office  [phone]  CC: Primary care physician; [name], MD  Note: This dictation was prepared with Dragon dictation along with smaller phrase technology. Any transcriptional errors that result from this process are unintentional.

## 2017-06-10 NOTE — Progress Notes (Signed)
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  has a past surgical history that includes Replacement total knee (Bilateral); Abdominal hysterectomy; Cholecystectomy; Oophorectomy; Knee surgery (Left); Cesarean section; Colonoscopy (11/2013); Esophagogastroduodenoscopy (11/2013); Esophagogastroduodenoscopy (N/A, 12/09/2015); PORTA CATH INSERTION (N/A, 04/18/2017); Colonoscopy with propofol (N/A, 05/10/2017); Esophagogastroduodenoscopy (egd) with propofol (N/A, 05/10/2017); and PORTA CATH INSERTION (Right, 05/21/2017). Prior to Admission medications   Medication Sig Start Date End Date Taking? Authorizing Provider  amoxicillin-clavulanate (AUGMENTIN) 875-125 MG tablet Take 1 tablet by mouth 2 (two) times daily for 7 days. 06/03/17 06/10/17 Yes Melynda Ripple, MD  gabapentin (NEURONTIN) 600 MG tablet Take 600 mg by mouth 2 (two) times daily.  08/01/10  Yes [provider]  levothyroxine (SYNTHROID, LEVOTHROID) 137 MCG tablet Take 137 mcg by mouth at bedtime.    Yes [provider]  Carboxymethylcellul-Glycerin (LUBRICATING EYE DROPS OP) Place 1 drop into both eyes daily as needed (allergies).    [provider]  diphenhydrAMINE-APAP, sleep, (EXCEDRIN PM) 38-500 MG TABS Take 1 tablet by mouth at bedtime as needed (sleep).    [provider]  diphenoxylate-atropine (LOMOTIL) 2.5-0.025 MG tablet Take 1 tablet by mouth 4 (four) times daily as needed  for diarrhea or loose stools. Patient not taking: Reported on 04/16/2017 12/11/16 12/11/17  Earleen Newport, MD  HYDROcodone-acetaminophen [facility]) 5-325 MG tablet Take 1 tablet by mouth every 6 (six) hours as needed for up to 7 doses for severe pain. Patient not taking: Reported on 04/16/2017 03/09/17   Darel Hong, MD  naproxen sodium (ALEVE) 220 MG tablet Take 220 mg by mouth 2 (two) times daily as needed (pain).    [provider]  ondansetron (ZOFRAN ODT) 4 MG disintegrating tablet Take 1 tablet (4 mg total) by mouth every 8 (eight) hours as needed. Patient not taking: Reported on 05/13/2017 02/07/17   Isla Pence, MD  tiZANidine (ZANAFLEX) 4 MG tablet Take 1 tablet by mouth 3 (three) times daily. 06/04/17   [provider]  traMADol (ULTRAM) 50 MG tablet Take 1 tablet (50 mg total) by mouth every 6 (six) hours as needed. Patient not taking: Reported on 05/13/2017 12/10/16   Lisa Roca, MD   Allergies  Allergen Reactions  . Morphine And Related Hives and Itching    ONLY MORPHINE, tolerates dilaudid, oxycodone, and norco ONLY MORPHINE, tolerates dilaudid, oxycodone, and norco  . Methotrexate Derivatives Other (See Comments)    Headaches   . Relafen [Nabumetone] Other (See Comments)    Headaches  . Haloperidol Nausea Only and Other (See Comments)    Patient stated that she broke out in cold sweats     FAMILY HISTORY:  family history includes Arthritis in her mother; Cancer in her paternal grandfather and paternal grandmother; Early death in her mother; Hypothyroidism in her brother and sister; Leukemia in her mother; Miscarriages / Stillbirths in her mother. SOCIAL HISTORY:  reports that she has never smoked. She has never used smokeless tobacco. She reports that she does not drink alcohol or use drugs.  REVIEW OF SYSTEMS:   Unable to obtain due  to critical illness   VITAL SIGNS: Temp:  [98.1 F (36.7 C)-98.4 F (36.9 C)] 98.1 F (36.7 C) (04/29  0749) Pulse Rate:  [49-73] 60 (04/29 1000) Resp:  [11-25] 15 (04/29 1000) BP: (72-126)/(48-83) 105/71 (04/29 1000) SpO2:  [91 %-100 %] 97 % (04/29 1000) Weight:  [77.5 kg (170 lb 13.7 oz)] 77.5 kg (170 lb 13.7 oz) (04/29 0500)  Physical Examination:  A + O and no acute neuro deficits\ On [place], no distress, able to talk in full sentences. BEAE and no rales S1 & S2 audible and no murmur Benign abdomen with normal peristalses Ext; No edema   ASSESSMENT / PLAN: Hypothyroidism with medical non compliance -Optimize Levothyroxine and monitor Free T4. -Taper off steroids as tolerated.  Lt. temporomandibular joint dysfunction and no ear infection as per ENT. -D/C ABX and follow up as an out patient  Anemia. -Stool Guaiac -ve -Keep HB > 7 gm/dl -Management as per hematology.  Full code  Supportive care  Critical care time 30 min

## 2017-06-10 NOTE — Progress Notes (Addendum)
Initial Nutrition Assessment  DOCUMENTATION CODES:   Obesity unspecified  INTERVENTION:  Will discontinue Ensure Enlive.  Provide Premier Protein po BID, each supplement provides 160 kcal and 30 grams of protein.  Encouraged intake of soft foods due to pain with chewing. Discussed soft foods from each food group available on menu, especially foods that are good sources of protein. Also discussed small, frequent meals in setting of hx of N/V and abdominal pain.  NUTRITION DIAGNOSIS:   Inadequate oral intake related to decreased appetite, other (see comment)(jaw/mouth pain) as evidenced by per patient/family report.  GOAL:   Patient will meet greater than or equal to 90% of their needs  MONITOR:   PO intake, Supplement acceptance, Labs, Weight trends, I & O's  REASON FOR ASSESSMENT:   Malnutrition Screening Tool    ASSESSMENT:   52 year old female with PMHx of depression, anemia, degenerative disc disease, lupus, porphyria, RA, fibromyalgia, gastroparesis, cyclic vomiting syndrome, chronic abdominal pain, constipation, anemia now admitted with symptomatic bradycardia and found to have severe hypothyroidism, adrenal insufficiency, ear/jaw pain from eustachian tube dysfunction, fluid in middle ear space, and left temporomandibular joint dysfunction (no ear infection per ENT).   Met with patient at bedside. She reports that for a few years now she has had a poor appetite. She has been experiencing intermittent abdominal pain, N/V, and anorexia. She reports that some days she can only have one meal per day. She denies any nausea now and reports some mild abdominal pain. She reports she is eating fairly well here. For breakfast this morning she had an egg and bacon sandwich with pineapple. She does not like the Ensure but is willing to try Premier Protein. She is having some left jaw/ear pain that is worsened by chewing now. She is able to tolerate soft meals well. Denies any food  allergies or intolerances.  UBW 267 lbs over one year ago. Per review of weight history in chart could not find a weight as high as 267 lbs even looking back to 2014. She was actually 133.9 lbs on 03/13/2016 and has since gained weight.  Meal Completion: 20% of breakfast yesterday, 90% of both lunch and dinner yesterday  Medications reviewed and include: Colace, famotidine, Ensure Enlive BID, folic acid 1 mg daily, Novolog 0-15 units TID, levothyroxine.  Labs reviewed: CBG 69-221 past 24 hrs, Sodium 134, Creatinine 1.03.  Patient does not meet criteria for malnutrition at this time.  Discussed with RN and on rounds.  NUTRITION - FOCUSED PHYSICAL EXAM:    Most Recent Value  Orbital Region  No depletion  Upper Arm Region  No depletion  Thoracic and Lumbar Region  No depletion  Buccal Region  No depletion  Temple Region  No depletion  Clavicle Bone Region  No depletion  Clavicle and Acromion Bone Region  No depletion  Scapular Bone Region  No depletion  Dorsal Hand  No depletion  Patellar Region  No depletion  Anterior Thigh Region  No depletion  Posterior Calf Region  No depletion  Edema (RD Assessment)  None  Hair  Reviewed  Eyes  Reviewed  Mouth  Reviewed [left mouth/jaw swollen]  Skin  Reviewed  Nails  Reviewed     Diet Order:  Diet regular Room service appropriate? Yes; Fluid consistency: Thin  EDUCATION NEEDS:   Education needs have been addressed  Skin:  Skin Assessment: Reviewed RN Assessment(ecchymosis to bilateral arms)  Last BM:  06/08/2017  Height:   Ht Readings from Last 1 Encounters:  06/08/17 5' 1"  (1.549 m)    Weight:   Wt Readings from Last 1 Encounters:  06/10/17 170 lb 13.7 oz (77.5 kg)    Ideal Body Weight:  47.7 kg  BMI:  Body mass index is 32.28 kg/m.  Estimated Nutritional Needs:   Kcal:  1580-1840 (MSJ x 1.2-1.4)  Protein:  80-95 grams (1-1.2 grams/kg)  Fluid:  1.6-1.8 L/day (1 mL/kcal)  Willey Blade, MS, RD, LDN Office:  [phone] Pager: [phone] After Hours/Weekend Pager: [phone]

## 2017-06-11 DIAGNOSIS — Z885 Allergy status to narcotic agent status: Secondary | ICD-10-CM

## 2017-06-11 DIAGNOSIS — I959 Hypotension, unspecified: Principal | ICD-10-CM

## 2017-06-11 DIAGNOSIS — Z8349 Family history of other endocrine, nutritional and metabolic diseases: Secondary | ICD-10-CM

## 2017-06-11 DIAGNOSIS — Z888 Allergy status to other drugs, medicaments and biological substances status: Secondary | ICD-10-CM

## 2017-06-11 DIAGNOSIS — D529 Folate deficiency anemia, unspecified: Secondary | ICD-10-CM

## 2017-06-11 LAB — UPEP/TP, 24-HR URINE
Albumin, U: 0 %
Alpha 1, Urine: 0 %
Alpha 2, Urine: 0 %
Beta, Urine: 0 %
Gamma Globulin, Urine: 0 %
Total Protein, Urine-Ur/day: <110 mg/24 hr (ref 30–150)
Total Protein, Urine: <4 mg/dL
Total Volume: 2740

## 2017-06-11 LAB — CBC WITH DIFFERENTIAL/PLATELET
BLASTS: 0 %
Band Neutrophils: 4 %
Basophils Absolute: 0 10*3/uL (ref 0–0.1)
Basophils Relative: 0 %
EOS PCT: 0 %
Eosinophils Absolute: 0 10*3/uL (ref 0–0.7)
HEMATOCRIT: 25.4 % — AB (ref 35.0–47.0)
Hemoglobin: 8.6 g/dL — ABNORMAL LOW (ref 12.0–16.0)
LYMPHS ABS: 1.2 10*3/uL (ref 1.0–3.6)
LYMPHS PCT: 14 %
MCH: 30.4 pg (ref 26.0–34.0)
MCHC: 33.9 g/dL (ref 32.0–36.0)
MCV: 89.5 fL (ref 80.0–100.0)
MONOS PCT: 1 %
Metamyelocytes Relative: 1 %
Monocytes Absolute: 0.1 10*3/uL — ABNORMAL LOW (ref 0.2–0.9)
Myelocytes: 0 %
NEUTROS ABS: 7.3 10*3/uL — AB (ref 1.4–6.5)
NEUTROS PCT: 80 %
NRBC: 1 /100{WBCs} — AB
OTHER: 0 %
PLATELETS: 152 10*3/uL (ref 150–440)
Promyelocytes Relative: 0 %
RBC: 2.84 MIL/uL — AB (ref 3.80–5.20)
RDW: 18.3 % — AB (ref 11.5–14.5)
WBC: 8.6 10*3/uL (ref 3.6–11.0)

## 2017-06-11 LAB — BASIC METABOLIC PANEL
ANION GAP: 7 (ref 5–15)
BUN: 12 mg/dL (ref 6–20)
CHLORIDE: 105 mmol/L (ref 101–111)
CO2: 24 mmol/L (ref 22–32)
Calcium: 8 mg/dL — ABNORMAL LOW (ref 8.9–10.3)
Creatinine, Ser: 0.96 mg/dL (ref 0.44–1.00)
GFR calc non Af Amer: >60 mL/min (ref >60)
Glucose, Bld: 100 mg/dL — ABNORMAL HIGH (ref 65–99)
POTASSIUM: 3.5 mmol/L (ref 3.5–5.1)
SODIUM: 136 mmol/L (ref 135–145)

## 2017-06-11 LAB — GLUCOSE, CAPILLARY
Glucose-Capillary: 85 mg/dL (ref 65–99)
Glucose-Capillary: 110 mg/dL — ABNORMAL HIGH (ref 65–99)
Glucose-Capillary: 120 mg/dL — ABNORMAL HIGH (ref 65–99)
Glucose-Capillary: 134 mg/dL — ABNORMAL HIGH (ref 65–99)

## 2017-06-11 LAB — PROLACTIN: PROLACTIN: 7.1 ng/mL (ref 4.8–23.3)

## 2017-06-11 LAB — CK: Total CK: 385 U/L — ABNORMAL HIGH (ref 38–234)

## 2017-06-11 LAB — ANTINUCLEAR ANTIBODIES, IFA: ANA Ab, IFA: NEGATIVE

## 2017-06-11 MED ORDER — TRAMADOL HCL 50 MG PO TABS
25.0000 mg | ORAL_TABLET | Freq: Four times a day (QID) | ORAL | Status: DC | PRN
  Administered 2017-06-11 – 2017-06-12 (×4): 25 mg via ORAL
  Filled 2017-06-11 (×4): qty 1

## 2017-06-11 MED ORDER — BISACODYL 5 MG PO TBEC
10.0000 mg | DELAYED_RELEASE_TABLET | Freq: Every day | ORAL | Status: DC | PRN
Start: 2017-06-11 — End: 2017-06-12
  Administered 2017-06-11: 10 mg via ORAL
  Filled 2017-06-11: qty 2

## 2017-06-11 MED ORDER — HYDROCORTISONE NA SUCCINATE PF 100 MG IJ SOLR
25.0000 mg | Freq: Two times a day (BID) | INTRAMUSCULAR | Status: DC
  Administered 2017-06-11 – 2017-06-12 (×2): 25 mg via INTRAVENOUS
  Filled 2017-06-11: qty 2
  Filled 2017-06-11 (×2): qty 0.5

## 2017-06-11 NOTE — Progress Notes (Signed)
Report given to Kennyth Lose, RN via phone. Pt transported to 1C with belongings.

## 2017-06-11 NOTE — Progress Notes (Signed)
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  has a past surgical history that includes Replacement total knee (Bilateral); Abdominal hysterectomy; Cholecystectomy; Oophorectomy; Knee surgery (Left); Cesarean section; Colonoscopy (11/2013); Esophagogastroduodenoscopy (11/2013); Esophagogastroduodenoscopy (N/A, 12/09/2015); PORTA CATH INSERTION (N/A, 04/18/2017); Colonoscopy with propofol (N/A, 05/10/2017); Esophagogastroduodenoscopy (egd) with propofol (N/A, 05/10/2017); and PORTA CATH INSERTION (Right, 05/21/2017). Prior to Admission medications   Medication Sig Start Date End Date Taking? Authorizing Provider  gabapentin (NEURONTIN) 600 MG tablet Take 600 mg by mouth 2 (two) times daily.  08/01/10  Yes [provider]  levothyroxine (SYNTHROID, LEVOTHROID) 137 MCG tablet Take 137 mcg by mouth at bedtime.    Yes [provider]  Carboxymethylcellul-Glycerin (LUBRICATING EYE DROPS OP) Place 1 drop into both eyes daily as needed (allergies).    [provider]  diphenhydrAMINE-APAP, sleep, (EXCEDRIN PM) 38-500 MG TABS Take 1 tablet by mouth at bedtime as needed (sleep).    [provider]  diphenoxylate-atropine (LOMOTIL) 2.5-0.025 MG tablet Take 1 tablet by mouth 4 (four) times daily as needed for diarrhea or loose stools. Patient not taking: Reported on 04/16/2017 12/11/16 12/11/17  Earleen Newport, MD  HYDROcodone-acetaminophen [facility]) 5-325 MG tablet  Take 1 tablet by mouth every 6 (six) hours as needed for up to 7 doses for severe pain. Patient not taking: Reported on 04/16/2017 03/09/17   Darel Hong, MD  naproxen sodium (ALEVE) 220 MG tablet Take 220 mg by mouth 2 (two) times daily as needed (pain).    [provider]  ondansetron (ZOFRAN ODT) 4 MG disintegrating tablet Take 1 tablet (4 mg total) by mouth every 8 (eight) hours as needed. Patient not taking: Reported on 05/13/2017 02/07/17   Isla Pence, MD  tiZANidine (ZANAFLEX) 4 MG tablet Take 1 tablet by mouth 3 (three) times daily. 06/04/17   [provider]  traMADol (ULTRAM) 50 MG tablet Take 1 tablet (50 mg total) by mouth every 6 (six) hours as needed. Patient not taking: Reported on 05/13/2017 12/10/16   Lisa Roca, MD   Allergies  Allergen Reactions  . Morphine And Related Hives and Itching    ONLY MORPHINE, tolerates dilaudid, oxycodone, and norco ONLY MORPHINE, tolerates dilaudid, oxycodone, and norco  . Methotrexate Derivatives Other (See Comments)    Headaches   . Relafen [Nabumetone] Other (See Comments)    Headaches  . Haloperidol Nausea Only and Other (See Comments)    Patient stated that she broke out in cold sweats     FAMILY HISTORY:  family history includes Arthritis in her mother; Cancer in her paternal grandfather and paternal grandmother; Early death in her mother; Hypothyroidism in her brother and sister; Leukemia in her mother; Miscarriages / Stillbirths in her mother. SOCIAL HISTORY:  reports that she has never smoked. She has never used smokeless tobacco. She reports that she does not drink alcohol or use drugs.  REVIEW OF SYSTEMS:   Unable to obtain due to critical illness   VITAL SIGNS: Temp:  [98.1 F (36.7 C)-98.5 F (36.9 C)] 98.1 F (36.7 C) (04/30 0730) Pulse Rate:  [49-78] 78 (  04/30 0800) Resp:  [12-23] 12 (04/30 0800) BP: (80-142)/(55-82) 142/82 (04/30 0800) SpO2:  [92 %-100 %] 100 % (04/30 0800) Weight:  [78 kg  (171 lb 15.3 oz)] 78 kg (171 lb 15.3 oz) (04/30 0421)  Physical Examination:  A + O and no acute neuro deficits\ On [place], no distress, able to talk in full sentences. BEAE and no rales S1 & S2 audible and no murmur Benign abdomen with normal peristalses Ext; No edema    ASSESSMENT / PLAN:  Hypothyroidism with medical non compliance -Optimize Levothyroxine and monitor Free T4. -Taper off steroids as tolerated.  Lt. temporomandibular joint dysfunction and no ear infection as per ENT. -D/C ABX and follow up as an out patient  Anemia. -Stool Guaiac -ve -Keep HB > 7 gm/dl -Management as per hematology.  Full code  Supportive care

## 2017-06-11 NOTE — Progress Notes (Signed)
[facility] [place] NAME: Adrienne Flores    MR#:  [id number]  DATE OF BIRTH:  [birth year]  SUBJECTIVE:  CHIEF COMPLAINT:   Chief Complaint  Patient presents with  . Leg Pain  improving, c/o TM Joint pain REVIEW OF SYSTEMS:  CONSTITUTIONAL: No fever, fatigue or weakness.  EYES: No blurred or double vision.  EARS, NOSE, AND THROAT: No tinnitus or ear pain.  RESPIRATORY: No cough, shortness of breath, wheezing or hemoptysis.  CARDIOVASCULAR: No chest pain, orthopnea, edema.  GASTROINTESTINAL: No nausea, vomiting, diarrhea or abdominal pain.  GENITOURINARY: No dysuria, hematuria.  ENDOCRINE: No polyuria, nocturia,  HEMATOLOGY: No anemia, easy bruising or bleeding SKIN: No rash or lesion. MUSCULOSKELETAL: No joint pain or arthritis.   NEUROLOGIC: No tingling, numbness, weakness.  PSYCHIATRY: No anxiety or depression.   ROS  DRUG ALLERGIES:   Allergies  Allergen Reactions  . Morphine And Related Hives and Itching    ONLY MORPHINE, tolerates dilaudid, oxycodone, and norco ONLY MORPHINE, tolerates dilaudid, oxycodone, and norco  . Methotrexate Derivatives Other (See Comments)    Headaches   . Relafen [Nabumetone] Other (See Comments)    Headaches  . Haloperidol Nausea Only and Other (See Comments)    Patient stated that she broke out in cold sweats     VITALS:  Blood pressure 97/71, pulse (!) 55, temperature 98.1 F (36.7 C), temperature source Oral, resp. rate 18, height 5\' 1"  (1.549 m), weight 78 kg (171 lb 15.3 oz), SpO2 99 %.  PHYSICAL EXAMINATION:  GENERAL:  52 y.o.-year-old patient lying in the bed with no acute distress.  EYES: Pupils equal, round, reactive to light and accommodation. No scleral icterus. Extraocular muscles intact.  HEENT: Head atraumatic, normocephalic. Oropharynx and nasopharynx clear.  NECK:  Supple, no jugular venous distention. No thyroid enlargement, no tenderness.  LUNGS: Normal breath sounds  bilaterally, no wheezing, rales,rhonchi or crepitation. No use of accessory muscles of respiration.  CARDIOVASCULAR: S1, S2 normal. No murmurs, rubs, or gallops.  ABDOMEN: Soft, nontender, nondistended. Bowel sounds present. No organomegaly or mass.  EXTREMITIES: No pedal edema, cyanosis, or clubbing.  NEUROLOGIC: Cranial nerves II through XII are intact. Muscle strength 5/5 in all extremities. Sensation intact. Gait not checked.  PSYCHIATRIC: The patient is alert and oriented x 3.  SKIN: No obvious rash, lesion, or ulcer.   Physical Exam LABORATORY PANEL:   CBC Recent Labs  Lab 06/11/17 0500  WBC 8.6  HGB 8.6*  HCT 25.4*  PLT 152   ------------------------------------------------------------------------------------------------------------------  Chemistries  Recent Labs  Lab 06/09/17 0427 06/10/17 0511 06/11/17 0500  NA 136 134* 136  K 4.0 3.9 3.5  CL 107 105 105  CO2 25 22 24   GLUCOSE 82 164* 100*  BUN 7 12 12   CREATININE 0.95 1.03* 0.96  CALCIUM 7.7* 8.0* 8.0*  MG  --  1.9  --   AST 50*  --   --   ALT 50  --   --   ALKPHOS 85  --   --   BILITOT 0.7  --   --    ------------------------------------------------------------------------------------------------------------------  Cardiac Enzymes Recent Labs  Lab 06/08/17 0910  TROPONINI <0.03   ------------------------------------------------------------------------------------------------------------------  RADIOLOGY:  No results found.  ASSESSMENT AND PLAN:  1 acute sinus bradycardia Secondary to severe hypothyroidism  2 acute hypokalemia  Repleted   3 acute hypotension  Most likely secondary to severe hypothyroidism and bradycardia  resolved  4 acute severe hypothyroidism - switch to  PO Synthroid from IV  5 acute on chronic anemia Stable  6. Lt. temporomandibular joint dysfunction and no ear infection as per ENT. -D/C ABX and follow up as an out patient  Likely transfer to floor as beds  available  All the records are reviewed and case discussed with Care Management/Social Workerr. Management plans discussed with the patient, family and they are in agreement.  CODE STATUS: full  TOTAL TIME TAKING CARE OF THIS PATIENT: 15 minutes.     POSSIBLE D/C IN 1-2 DAYS, DEPENDING ON CLINICAL CONDITION.   Max Sane M.D on 06/11/2017   Between 7am to 6pm - Pager - [phone]  After 6pm go to [email or web address] - password [email or web address] [facility]  Office  [phone]  CC: Primary care physician; [name], MD  Note: This dictation was prepared with Dragon dictation along with smaller phrase technology. Any transcriptional errors that result from this process are unintentional.

## 2017-06-12 LAB — CBC
HCT: 26.4 % — ABNORMAL LOW (ref 35.0–47.0)
Hemoglobin: 8.9 g/dL — ABNORMAL LOW (ref 12.0–16.0)
MCH: 30.2 pg (ref 26.0–34.0)
MCHC: 33.5 g/dL (ref 32.0–36.0)
MCV: 90 fL (ref 80.0–100.0)
PLATELETS: 164 10*3/uL (ref 150–440)
RBC: 2.93 MIL/uL — ABNORMAL LOW (ref 3.80–5.20)
RDW: 19.1 % — ABNORMAL HIGH (ref 11.5–14.5)
WBC: 10.1 10*3/uL (ref 3.6–11.0)

## 2017-06-12 LAB — BASIC METABOLIC PANEL
ANION GAP: 5 (ref 5–15)
BUN: 15 mg/dL (ref 6–20)
CALCIUM: 7.9 mg/dL — AB (ref 8.9–10.3)
CO2: 26 mmol/L (ref 22–32)
Chloride: 105 mmol/L (ref 101–111)
Creatinine, Ser: 0.93 mg/dL (ref 0.44–1.00)
Glucose, Bld: 94 mg/dL (ref 65–99)
POTASSIUM: 3 mmol/L — AB (ref 3.5–5.1)
Sodium: 136 mmol/L (ref 135–145)

## 2017-06-12 LAB — METHYLMALONIC ACID, SERUM: Methylmalonic Acid, Quantitative: 178 nmol/L (ref 0–378)

## 2017-06-12 LAB — MULTIPLE MYELOMA PANEL, SERUM
Albumin SerPl Elph-Mcnc: 3 g/dL (ref 2.9–4.4)
Albumin/Glob SerPl: 1 (ref 0.7–1.7)
Alpha 1: 0.3 g/dL (ref 0.0–0.4)
Alpha2 Glob SerPl Elph-Mcnc: 0.6 g/dL (ref 0.4–1.0)
B-Globulin SerPl Elph-Mcnc: 1.3 g/dL (ref 0.7–1.3)
Gamma Glob SerPl Elph-Mcnc: 0.9 g/dL (ref 0.4–1.8)
Globulin, Total: 3.2 g/dL (ref 2.2–3.9)
IgA: 564 mg/dL — ABNORMAL HIGH (ref 87–352)
IgG (Immunoglobin G), Serum: 1068 mg/dL (ref 700–1600)
IgM (Immunoglobulin M), Srm: 158 mg/dL (ref 26–217)
M Protein SerPl Elph-Mcnc: 0.3 g/dL — ABNORMAL HIGH
Total Protein ELP: 6.2 g/dL (ref 6.0–8.5)

## 2017-06-12 LAB — MAGNESIUM: MAGNESIUM: 2.1 mg/dL (ref 1.7–2.4)

## 2017-06-12 LAB — GLUCOSE, CAPILLARY: Glucose-Capillary: 99 mg/dL (ref 65–99)

## 2017-06-12 LAB — CK: CK TOTAL: 279 U/L — AB (ref 38–234)

## 2017-06-12 MED ORDER — LEVOTHYROXINE SODIUM 150 MCG PO TABS: 150.0000 ug | ORAL_TABLET | Freq: Every day | ORAL | 0 refills | Status: AC

## 2017-06-12 MED ORDER — HEPARIN SOD (PORK) LOCK FLUSH 100 UNIT/ML IV SOLN
500.0000 [IU] | Freq: Once | INTRAVENOUS | Status: AC
  Administered 2017-06-12: 500 [IU] via INTRAVENOUS
  Filled 2017-06-12: qty 5

## 2017-06-12 MED ORDER — FOLIC ACID 1 MG PO TABS: 1.0000 mg | ORAL_TABLET | Freq: Every day | ORAL | 0 refills | Status: DC

## 2017-06-12 MED ORDER — POTASSIUM CHLORIDE CRYS ER 20 MEQ PO TBCR
40.0000 meq | EXTENDED_RELEASE_TABLET | Freq: Once | ORAL | Status: AC
  Administered 2017-06-12: 08:00:00 40 meq via ORAL
  Filled 2017-06-12: qty 2

## 2017-06-12 MED ORDER — VITAMIN B-12 1000 MCG PO TABS: 1000.0000 ug | ORAL_TABLET | Freq: Every day | ORAL | 0 refills | Status: AC

## 2017-06-12 NOTE — Care Management Important Message (Signed)
Important Message  Patient Details  Name: Adrienne Flores MRN: [id number] Date of Birth: Jul 04, 1965   Medicare Important Message Given:  Yes    [name] 06/12/2017, 9:31 AM

## 2017-06-12 NOTE — Progress Notes (Signed)
Hematology/Oncology Consult note [facility]  [phone] [phone]  Patient Care Team: [name], MD as PCP - General (Family Medicine)   Name of the patient: Adrienne Flores  [id number]  December 21, 1965    Interval history- reports feeling fatigued. Has chronic abdominal pain  Pain scale-4  Review of systems- Review of Systems  Constitutional: Positive for malaise/fatigue. Negative for chills, fever and weight loss.  HENT: Negative for congestion, ear discharge and nosebleeds.   Eyes: Negative for blurred vision.  Respiratory: Negative for cough, hemoptysis, sputum production, shortness of breath and wheezing.   Cardiovascular: Negative for chest pain, palpitations, orthopnea and claudication.  Gastrointestinal: Positive for abdominal pain. Negative for blood in stool, constipation, diarrhea, heartburn, melena, nausea and vomiting.  Genitourinary: Negative for dysuria, flank pain, frequency, hematuria and urgency.  Musculoskeletal: Negative for back pain, joint pain and myalgias.  Skin: Negative for rash.  Neurological: Negative for dizziness, tingling, focal weakness, seizures, weakness and headaches.  Endo/Heme/Allergies: Does not bruise/bleed easily.  Psychiatric/Behavioral: Negative for depression and suicidal ideas. The patient does not have insomnia.       Allergies  Allergen Reactions  . Morphine And Related Hives and Itching    ONLY MORPHINE, tolerates dilaudid, oxycodone, and norco ONLY MORPHINE, tolerates dilaudid, oxycodone, and norco  . Methotrexate Derivatives Other (See Comments)    Headaches   . Relafen [Nabumetone] Other (See Comments)    Headaches  . Haloperidol Nausea Only and Other (See Comments)    Patient stated that she broke out in cold sweats      Past Medical History:  Diagnosis Date  . Anemia   . At high risk for falls   . Chronic abdominal pain 2008   dates back to at least 2008.   multiple ultrasound, CT scans, x rays at [facility], [place].  [facility] colonoscopy, egd unrevealing.    . Colitis 2015   ? colitis per CTs in 2015, 2016 and 11/2015.  presumed infectious.  colonoscopy in 2015 negative for colitis.   . Constipation   . Cyclic vomiting syndrome 2015   per opinion  of Dr Granville Lewis, IBS specialist at [facility].   . Degenerative disc disease   . Depression   . Fibromyalgia   . Gastroparesis 03/2011   markedly abnormal gastric emptying study.   . Hematemesis 11/2015   mild to moderate blood in emesis appeared after 2 plus weeks of frequent, non-bloody emesis.   [name] Hemorrhoids   . Hemorrhoids   . History of chicken pox   . Hypokalemia   . Hypothyroid   . Lupus ([place])   . 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 Rheumatoid arthritis [facility])      Past Surgical History:  Procedure Laterality Date  . ABDOMINAL HYSTERECTOMY     total  . CESAREAN SECTION     x2  . CHOLECYSTECTOMY    . COLONOSCOPY  11/2013   at [facility] Dr Kennith Gain. Hemorrhoids. sigmoid tics. random biopsies negative for microscopic colitis or any pathology.   . COLONOSCOPY WITH PROPOFOL N/A 05/10/2017   Procedure: COLONOSCOPY WITH PROPOFOL;  Surgeon: Lucilla Lame, MD;  Location: [place];  Service: Endoscopy;  Laterality: N/A;  . ESOPHAGOGASTRODUODENOSCOPY  11/2013   Dr Kennith Gain at [facility].  Normal study.  Duodenal bx negative for villous atrophy.   . ESOPHAGOGASTRODUODENOSCOPY N/A 12/09/2015   Procedure: ESOPHAGOGASTRODUODENOSCOPY (EGD);  Surgeon: Doran Stabler, MD;  Location: [facility] ENDOSCOPY;  Service: Endoscopy;  Laterality: N/A;  .  ESOPHAGOGASTRODUODENOSCOPY (EGD) WITH PROPOFOL N/A 05/10/2017   Procedure: ESOPHAGOGASTRODUODENOSCOPY (EGD) WITH PROPOFOL;  Surgeon: Lucilla Lame, MD;  Location: [place];  Service: Endoscopy;  Laterality: N/A;  . KNEE SURGERY Left   . OOPHORECTOMY    . PORTA CATH INSERTION N/A 04/18/2017   Procedure: PORTA CATH INSERTION;  Surgeon: Katha Cabal, MD;  Location: [place] CV LAB;  Service: Cardiovascular;  Laterality: N/A;  . PORTA CATH INSERTION Right 05/21/2017   Procedure: PORTA CATH INSERTION;  Surgeon: Katha Cabal, MD;  Location: [place] [facility];  Service: Cardiovascular;  Laterality: Right;  . REPLACEMENT TOTAL KNEE Bilateral     Social History   Socioeconomic History  . Marital status: Married    Spouse name: Not on file  . Number of children: Not on file  . Years of education: Not on file  . Highest education level: Not on file  Occupational History  . Not on file  Social Needs  . Financial resource strain: Not on file  . Food insecurity:    Worry: Not on file    Inability: Not on file  . Transportation needs:    Medical: Not on file    Non-medical: Not on file  Tobacco Use  . Smoking status: Never Smoker  . Smokeless tobacco: Never Used  Substance and Sexual Activity  . Alcohol use: No  . Drug use: No  . Sexual activity: Yes    Partners: Male  Lifestyle  . Physical activity:    Days per week: Not on file    Minutes per session: Not on file  . Stress: Not on file  Relationships  . Social connections:    Talks on phone: Not on file    Gets together: Not on file    Attends religious service: Not on file    Active member of club or organization: Not on file    Attends meetings of clubs or organizations: Not on file    Relationship status: Not on file  . Intimate partner violence:    Fear of current or ex partner: Not on file    Emotionally abused: Not on file    Physically abused: Not on file    Forced sexual activity: Not on file  Other Topics Concern  . Not on file  Social History Narrative  . Not on file    Family History  Problem Relation Age of Onset  . Leukemia Mother   . Arthritis Mother   . Early death Mother   . Miscarriages / [place] Mother   . Hypothyroidism Sister   . Cancer Paternal Grandmother   . Cancer Paternal Grandfather   . Hypothyroidism Brother      Current  Facility-Administered Medications:  .  acetaminophen (TYLENOL) tablet 650 mg, 650 mg, Oral, Q6H PRN, 650 mg at 06/10/17 1137 **OR** acetaminophen (TYLENOL) suppository 650 mg, 650 mg, Rectal, Q6H PRN, Idelle Crouch, MD .  bisacodyl (DULCOLAX) EC tablet 10 mg, 10 mg, Oral, Daily PRN, Max Sane, MD, 10 mg at 06/11/17 1519 .  docusate sodium (COLACE) capsule 100 mg, 100 mg, Oral, BID, Idelle Crouch, MD, 100 mg at 06/11/17 2106 .  famotidine (PEPCID) tablet 20 mg, 20 mg, Oral, BID, Samaan, Maged, MD, 20 mg at 06/11/17 2106 .  folic acid (FOLVITE) tablet 1 mg, 1 mg, Oral, Daily, Corcoran, Melissa C, MD, 1 mg at 06/11/17 0957 .  gabapentin (NEURONTIN) tablet 600 mg, 600 mg, Oral, BID, Sparks, Leonie Douglas, MD, 600  mg at 06/11/17 2106 .  hydrocortisone sodium succinate (SOLU-CORTEF) 100 MG injection 25 mg, 25 mg, Intravenous, Q12H, Samaan, Maged, MD, 25 mg at 06/12/17 0439 .  insulin aspart (novoLOG) injection 0-15 Units, 0-15 Units, Subcutaneous, TID WC, Lafayette Dragon, MD, 2 Units at 06/10/17 1717 .  levothyroxine (SYNTHROID, LEVOTHROID) tablet 150 mcg, 150 mcg, Oral, QAC breakfast, Soyla Murphy, Maged, MD, 150 mcg at 06/11/17 0804 .  ondansetron (ZOFRAN) tablet 4 mg, 4 mg, Oral, Q6H PRN **OR** ondansetron (ZOFRAN) injection 4 mg, 4 mg, Intravenous, Q6H PRN, Idelle Crouch, MD .  phenol (CHLORASEPTIC) mouth spray 1 spray, 1 spray, Mouth/Throat, PRN, Lafayette Dragon, MD .  potassium chloride SA (K-DUR,KLOR-CON) CR tablet 40 mEq, 40 mEq, Oral, Once, Manuella Ghazi, Vipul, MD .  protein supplement (PREMIER PROTEIN) liquid, 11 oz, Oral, BID BM, Samaan, Maged, MD, 11 oz at 06/11/17 1440 .  sodium chloride flush (NS) 0.9 % injection 10-40 mL, 10-40 mL, Intracatheter, PRN, Manuella Ghazi, Vipul, MD .  tiZANidine (ZANAFLEX) tablet 4 mg, 4 mg, Oral, TID, Idelle Crouch, MD, 4 mg at 06/11/17 2106 .  traMADol (ULTRAM) tablet 25 mg, 25 mg, Oral, Q6H PRN, Awilda Bill, NP, 25 mg at 06/12/17 0439  Physical exam:    Vitals:   06/11/17 2105 06/11/17 2154 06/12/17 0424 06/12/17 0751  BP: 92/73 114/79 96/71 114/80  Pulse: 69 (!) 58 (!) 58 (!) 57  Resp: 15 20 15 18   Temp:  97.9 F (36.6 C) 97.7 F (36.5 C) 97.9 F (36.6 C)  TempSrc:  Oral Oral Oral  SpO2: 97% 100% 98% 100%  Weight:  156 lb 12 oz (71.1 kg)    Height:  5\' 1"  (1.549 m)     Physical Exam  Constitutional: She is oriented to person, place, and time.  Appears fatigued  HENT:  Head: Normocephalic and atraumatic.  Eyes: Pupils are equal, round, and reactive to light. EOM are normal.  Neck: Normal range of motion.  Cardiovascular: Normal rate, regular rhythm and normal heart sounds.  Pulmonary/Chest: Effort normal and breath sounds normal.  Abdominal: Soft. Bowel sounds are normal.  Neurological: She is alert and oriented to person, place, and time.  Skin: Skin is warm and dry.     CMP Latest Ref Rng & Units 06/12/2017  Glucose 65 - 99 mg/dL 94  BUN 6 - 20 mg/dL 15  Creatinine 0.44 - 1.00 mg/dL 0.93  Sodium 135 - 145 mmol/L 136  Potassium 3.5 - 5.1 mmol/L 3.0(L)  Chloride 101 - 111 mmol/L 105  CO2 22 - 32 mmol/L 26  Calcium 8.9 - 10.3 mg/dL 7.9(L)  Total Protein 6.5 - 8.1 g/dL -  Total Bilirubin 0.3 - 1.2 mg/dL -  Alkaline Phos 38 - 126 U/L -  AST 15 - 41 U/L -  ALT 14 - 54 U/L -   CBC Latest Ref Rng & Units 06/12/2017  WBC 3.6 - 11.0 K/uL 10.1  Hemoglobin 12.0 - 16.0 g/dL 8.9(L)  Hematocrit 35.0 - 47.0 % 26.4(L)  Platelets 150 - 440 K/uL 164    @IMAGES @  US Venous Img Lower Unilateral Left  Result Date: 06/08/2017 CLINICAL DATA:  52 year old female with left lower leg pain EXAM: LEFT LOWER EXTREMITY VENOUS DOPPLER ULTRASOUND TECHNIQUE: Gray-scale sonography with graded compression, as well as color Doppler and duplex ultrasound were performed to evaluate the lower extremity deep venous systems from the level of the common femoral vein and including the common femoral, femoral, profunda femoral, popliteal and calf veins  including the posterior tibial, peroneal and gastrocnemius veins when visible. The superficial great saphenous vein was also interrogated. Spectral Doppler was utilized to evaluate flow at rest and with distal augmentation maneuvers in the common femoral, femoral and popliteal veins. COMPARISON:  None. FINDINGS: Contralateral Common Femoral Vein: Respiratory phasicity is normal and symmetric with the symptomatic side. No evidence of thrombus. Normal compressibility. Common Femoral Vein: No evidence of thrombus. Normal compressibility, respiratory phasicity and response to augmentation. Saphenofemoral Junction: No evidence of thrombus. Normal compressibility and flow on color Doppler imaging. Profunda Femoral Vein: No evidence of thrombus. Normal compressibility and flow on color Doppler imaging. Femoral Vein: No evidence of thrombus. Normal compressibility, respiratory phasicity and response to augmentation. Popliteal Vein: No evidence of thrombus. Normal compressibility, respiratory phasicity and response to augmentation. Calf Veins: No evidence of thrombus. Normal compressibility and flow on color Doppler imaging. Superficial Great Saphenous Vein: No evidence of thrombus. Normal compressibility. Venous Reflux:  None. Other Findings:  None. IMPRESSION: No evidence of deep venous thrombosis. Electronically Signed   By: Jacqulynn Cadet M.D.   On: 06/08/2017 11:09   Dg Chest Port 1 View  Result Date: 06/08/2017 CLINICAL DATA:  Bradycardia. Hypotension. Lupus and rheumatoid arthritis. EXAM: PORTABLE CHEST 1 VIEW COMPARISON:  03/11/2017 FINDINGS: The heart size and mediastinal contours are within normal limits. Right-sided power port is seen in appropriate position. Both lungs are clear. The visualized skeletal structures are unremarkable. IMPRESSION: No active disease. Electronically Signed   By: Earle Gell M.D.   On: 06/08/2017 09:49     Assessment and plan- Patient is a 52 y.o. female with normocytic  anemia admitted for hypotension and bradycardia secondary to hypothyroidism  I have reveiwed repeat anemia work up recommended by Dr. Mike Gip. Patient has baseline chronic normocytoc anemia and her hb is around 10 at baseline. She was found to have folate deficiency in the past but has not been taking po folate as an outpatient. She did recently receive IV iron as well. Recommend continuing oral folic acid 1 mg PO daily. ercent b12 levels checked in te hospital are borderline low. She should be discharged on po b12 1000 mcg daily. Her anemia is also partly due to uncontrolled hypothyroidism.  I will follow up with her few weeks after discharge from the hospital. We will sign off. Call with questions   Visit Diagnosis 1. Hypotension, unspecified hypotension type   2. Bradycardia   3. Folate deficiency      Dr. Randa Evens, MD, MPH [facility] at [facility] [phone] 06/12/2017 8:11 AM

## 2017-06-12 NOTE — Care Management (Signed)
No discharge needs identified by members of the care team 

## 2017-06-12 NOTE — Progress Notes (Signed)
Pt A&Ox4 with occasional confusion. VS stable. Port de-accessed and flushed per protocol. Pt walked to and from nurses station with nurse and MD. Steady gate, moderate weakness, instructed to walk with assistance when ambulating at home.  Discharge and medication instructions given to patient, verbalized understanding. Husband to transport home.  Kyla Balzarine, LPN

## 2017-06-12 NOTE — Discharge Instructions (Signed)
Hypothyroidism Hypothyroidism is a disorder of the thyroid. The thyroid is a large gland that is located in the lower front of the neck. The thyroid releases hormones that control how the body works. With hypothyroidism, the thyroid does not make enough of these hormones. What are the causes? Causes of hypothyroidism may include:  Viral infections.  Pregnancy.  Your own defense system (immune system) attacking your thyroid.  Certain medicines.  Birth defects.  Past radiation treatments to your head or neck.  Past treatment with radioactive iodine.  Past surgical removal of part or all of your thyroid.  Problems with the gland that is located in the center of your brain (pituitary).  What are the signs or symptoms? Signs and symptoms of hypothyroidism may include:  Feeling as though you have no energy (lethargy).  Inability to tolerate cold.  Weight gain that is not explained by a change in diet or exercise habits.  Dry skin.  Coarse hair.  Menstrual irregularity.  Slowing of thought processes.  Constipation.  Sadness or depression.  How is this diagnosed? Your health care provider may diagnose hypothyroidism with blood tests and ultrasound tests. How is this treated? Hypothyroidism is treated with medicine that replaces the hormones that your body does not make. After you begin treatment, it may take several weeks for symptoms to go away. Follow these instructions at home:  Take medicines only as directed by your health care provider.  If you start taking any new medicines, tell your health care provider.  Keep all follow-up visits as directed by your health care provider. This is important. As your condition improves, your dosage needs may change. You will need to have blood tests regularly so that your health care provider can watch your condition. Contact a health care provider if:  Your symptoms do not get better with treatment.  You are taking thyroid  replacement medicine and: ? You sweat excessively. ? You have tremors. ? You feel anxious. ? You lose weight rapidly. ? You cannot tolerate heat. ? You have emotional swings. ? You have diarrhea. ? You feel weak. Get help right away if:  You develop chest pain.  You develop an irregular heartbeat.  You develop a rapid heartbeat. This information is not intended to replace advice given to you by your health care provider. Make sure you discuss any questions you have with your health care provider. Document Released: 01/29/2005 Document Revised: 07/07/2015 Document Reviewed: 06/16/2013 Elsevier Interactive Patient Education  2018 [employer]  

## 2017-06-15 NOTE — Discharge Summary (Signed)
[facility] NAME: Adrienne Flores    MR#:  [id number]  DATE OF BIRTH:  [birth year]  DATE OF ADMISSION:  06/08/2017   ADMITTING PHYSICIAN: Idelle Crouch, MD  DATE OF DISCHARGE: 06/12/2017 10:54 AM  PRIMARY CARE PHYSICIAN: [name], MD   ADMISSION DIAGNOSIS:  Bradycardia [R00.1] Hypotension [I95.9] DISCHARGE DIAGNOSIS:  Principal Problem:   Symptomatic anemia Active Problems:   Rheumatoid arthritis ([place])   Hypotension   Hypokalemia  SECONDARY DIAGNOSIS:   Past Medical History:  Diagnosis Date  . Anemia   . At high risk for falls   . Chronic abdominal pain 2008   dates back to at least 2008.  multiple ultrasound, CT scans, x rays at [facility], [place].  [facility] colonoscopy, egd unrevealing.    . Colitis 2015   ? colitis per CTs in 2015, 2016 and 11/2015.  presumed infectious.  colonoscopy in 2015 negative for colitis.   . Constipation   . Cyclic vomiting syndrome 2015   per opinion  of Dr Granville Lewis, IBS specialist at [facility].   . Degenerative disc disease   . Depression   . Fibromyalgia   . Gastroparesis 03/2011   markedly abnormal gastric emptying study.   . Hematemesis 11/2015   mild to moderate blood in emesis appeared after 2 plus weeks of frequent, non-bloody emesis.   [name] Hemorrhoids   . Hemorrhoids   . History of chicken pox   . Hypokalemia   . Hypothyroid   . Lupus ([place])   . 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 Rheumatoid arthritis [facility])    HOSPITAL COURSE:  52 y.o. female with normocytic anemia admitted for hypotension and bradycardia secondary to hypothyroidism  1 acute sinus bradycardia Secondary to severe hypothyroidism - improving  2 acute hypokalemia  Repleted   3 acute hypotension  Most likely secondary to severe hypothyroidism and bradycardia  resolved  4 acute severe hypothyroidism - switch to PO Synthroid from IV  5 acute on chronic normocytic anemia - hb is around 10 at baseline. She  was found to have folate deficiency in the past but has not been taking po folate as an outpatient. She did recently receive IV iron as well. Recommend continuing oral folic acid 1 mg PO daily. her b12 levels checked in te hospital are borderline low. being discharged on po b12 1000 mcg daily. Her anemia is also partly due to uncontrolled hypothyroidism.  6. Lt. temporomandibular joint dysfunction and no ear infection as per ENT. - follow up with ENT as an out patient DISCHARGE CONDITIONS:  stable CONSULTS OBTAINED:  Treatment Team:  Carloyn Manner, MD DRUG ALLERGIES:   Allergies  Allergen Reactions  . Morphine And Related Hives and Itching    ONLY MORPHINE, tolerates dilaudid, oxycodone, and norco ONLY MORPHINE, tolerates dilaudid, oxycodone, and norco  . Methotrexate Derivatives Other (See Comments)    Headaches   . Relafen [Nabumetone] Other (See Comments)    Headaches  . Haloperidol Nausea Only and Other (See Comments)    Patient stated that she broke out in cold sweats    DISCHARGE MEDICATIONS:   Allergies as of 06/12/2017      Reactions   Morphine And Related Hives, Itching   ONLY MORPHINE, tolerates dilaudid, oxycodone, and norco ONLY MORPHINE, tolerates dilaudid, oxycodone, and norco   Methotrexate Derivatives Other (See Comments)   Headaches   Relafen [nabumetone] Other (See Comments)   Headaches   Haloperidol Nausea Only, Other (  See Comments)   Patient stated that she broke out in cold sweats       Medication List    STOP taking these medications   amoxicillin-clavulanate 875-125 MG tablet Commonly known as:  AUGMENTIN   diphenoxylate-atropine 2.5-0.025 MG tablet Commonly known as:  LOMOTIL   HYDROcodone-acetaminophen 5-325 MG tablet Commonly known as:  NORCO   ondansetron 4 MG disintegrating tablet Commonly known as:  ZOFRAN ODT   traMADol 50 MG tablet Commonly known as:  ULTRAM     TAKE these medications   EXCEDRIN PM 38-500 MG Tabs Generic  drug:  diphenhydrAMINE-APAP (sleep) Take 1 tablet by mouth at bedtime as needed (sleep).   folic acid 1 MG tablet Commonly known as:  FOLVITE Take 1 tablet (1 mg total) by mouth daily.   gabapentin 600 MG tablet Commonly known as:  NEURONTIN Take 600 mg by mouth 2 (two) times daily.   levothyroxine 150 MCG tablet Commonly known as:  SYNTHROID, LEVOTHROID Take 1 tablet (150 mcg total) by mouth daily before breakfast. What changed:    medication strength  how much to take  when to take this   LUBRICATING EYE DROPS OP Place 1 drop into both eyes daily as needed (allergies).   naproxen sodium 220 MG tablet Commonly known as:  ALEVE Take 220 mg by mouth 2 (two) times daily as needed (pain).   tiZANidine 4 MG tablet Commonly known as:  ZANAFLEX Take 1 tablet by mouth 3 (three) times daily.   vitamin B-12 1000 MCG tablet Commonly known as:  CYANOCOBALAMIN Take 1 tablet (1,000 mcg total) by mouth daily.        DISCHARGE INSTRUCTIONS:   DIET:  Regular diet DISCHARGE CONDITION:  Good ACTIVITY:  Activity as tolerated OXYGEN:  Home Oxygen: No.  Oxygen Delivery: room air DISCHARGE LOCATION:  home   If you experience worsening of your admission symptoms, develop shortness of breath, life threatening emergency, suicidal or homicidal thoughts you must seek medical attention immediately by calling 911 or calling your MD immediately  if symptoms less severe.  You Must read complete instructions/literature along with all the possible adverse reactions/side effects for all the Medicines you take and that have been prescribed to you. Take any new Medicines after you have completely understood and accpet all the possible adverse reactions/side effects.   Please note  You were cared for by a hospitalist during your hospital stay. If you have any questions about your discharge medications or the care you received while you were in the hospital after you are discharged, you can  call the unit and asked to speak with the hospitalist on call if the hospitalist that took care of you is not available. Once you are discharged, your primary care physician will handle any further medical issues. Please note that NO REFILLS for any discharge medications will be authorized once you are discharged, as it is imperative that you return to your primary care physician (or establish a relationship with a primary care physician if you do not have one) for your aftercare needs so that they can reassess your need for medications and monitor your lab values.    On the day of Discharge:  VITAL SIGNS:  Blood pressure 114/80, pulse (!) 57, temperature 97.9 F (36.6 C), temperature source Oral, resp. rate 18, height 5\' 1"  (1.549 m), weight 71.1 kg (156 lb 12 oz), SpO2 100 %. PHYSICAL EXAMINATION:  GENERAL:  52 y.o.-year-old patient lying in the bed with no acute distress.  EYES: Pupils equal, round, reactive to light and accommodation. No scleral icterus. Extraocular muscles intact.  HEENT: Head atraumatic, normocephalic. Oropharynx and nasopharynx clear.  NECK:  Supple, no jugular venous distention. No thyroid enlargement, no tenderness.  LUNGS: Normal breath sounds bilaterally, no wheezing, rales,rhonchi or crepitation. No use of accessory muscles of respiration.  CARDIOVASCULAR: S1, S2 normal. No murmurs, rubs, or gallops.  ABDOMEN: Soft, non-tender, non-distended. Bowel sounds present. No organomegaly or mass.  EXTREMITIES: No pedal edema, cyanosis, or clubbing.  NEUROLOGIC: Cranial nerves II through XII are intact. Muscle strength 5/5 in all extremities. Sensation intact. Gait not checked.  PSYCHIATRIC: The patient is alert and oriented x 3.  SKIN: No obvious rash, lesion, or ulcer.  DATA REVIEW:   CBC Recent Labs  Lab 06/12/17 0442  WBC 10.1  HGB 8.9*  HCT 26.4*  PLT 164    Chemistries  Recent Labs  Lab 06/09/17 0427  06/12/17 0442  NA 136   < > 136  K 4.0   < > 3.0*    CL 107   < > 105  CO2 25   < > 26  GLUCOSE 82   < > 94  BUN 7   < > 15  CREATININE 0.95   < > 0.93  CALCIUM 7.7*   < > 7.9*  MG  --    < > 2.1  AST 50*  --   --   ALT 50  --   --   ALKPHOS 85  --   --   BILITOT 0.7  --   --    < > = values in this interval not displayed.    Follow-up Information    Ricardo Jericho, NP. Go on 06/18/2017.   Specialty:  Family Medicine Why:  @ 11am Contact information: [place] [address] [phone]        Margaretha Sheffield, MD On 06/28/2017.   Specialty:  Otolaryngology Why:  @ 3:25pm Contact information: [place]. [place] [address] [phone]            Management plans discussed with the patient, family and they are in agreement.  CODE STATUS: Prior   TOTAL TIME TAKING CARE OF THIS PATIENT: 45 minutes.    Max Sane M.D on 06/15/2017 at 9:52 AM  Between 7am to 6pm - Pager - [phone]  After 6pm go to [email or web address] - Proofreader  [facility]  Office  [phone]  CC: Primary care physician; [name], MD   Note: This dictation was prepared with Dragon dictation along with smaller phrase technology. Any transcriptional errors that result from this process are unintentional.

## 2017-06-18 ENCOUNTER — Other Ambulatory Visit: Payer: Self-pay | Admitting: *Deleted

## 2017-06-18 DIAGNOSIS — E538 Deficiency of other specified B group vitamins: Secondary | ICD-10-CM

## 2017-06-18 DIAGNOSIS — D649 Anemia, unspecified: Secondary | ICD-10-CM

## 2017-06-18 DIAGNOSIS — D509 Iron deficiency anemia, unspecified: Secondary | ICD-10-CM

## 2017-08-05 ENCOUNTER — Inpatient Hospital Stay: Payer: Medicare Other

## 2017-08-05 ENCOUNTER — Inpatient Hospital Stay: Payer: Medicare Other | Attending: Oncology | Admitting: Oncology

## 2017-11-04 ENCOUNTER — Inpatient Hospital Stay (HOSPITAL_COMMUNITY)
Admission: EM | Admit: 2017-11-04 | Discharge: 2017-11-09 | DRG: 392 | Disposition: A | Discharge status: Home / Self Care | Payer: Medicare Other | Attending: Student in an Organized Health Care Education/Training Program | Admitting: Student in an Organized Health Care Education/Training Program

## 2017-11-04 ENCOUNTER — Other Ambulatory Visit: Payer: Self-pay

## 2017-11-04 ENCOUNTER — Emergency Department (HOSPITAL_COMMUNITY): Payer: Medicare Other

## 2017-11-04 ENCOUNTER — Encounter (HOSPITAL_COMMUNITY): Payer: Self-pay | Admitting: Emergency Medicine

## 2017-11-04 DIAGNOSIS — Z9049 Acquired absence of other specified parts of digestive tract: Secondary | ICD-10-CM

## 2017-11-04 DIAGNOSIS — Z7952 Long term (current) use of systemic steroids: Secondary | ICD-10-CM

## 2017-11-04 DIAGNOSIS — Z79899 Other long term (current) drug therapy: Secondary | ICD-10-CM | POA: Diagnosis not present

## 2017-11-04 DIAGNOSIS — G8929 Other chronic pain: Secondary | ICD-10-CM | POA: Diagnosis present

## 2017-11-04 DIAGNOSIS — Z9181 History of falling: Secondary | ICD-10-CM

## 2017-11-04 DIAGNOSIS — N189 Chronic kidney disease, unspecified: Secondary | ICD-10-CM | POA: Diagnosis present

## 2017-11-04 DIAGNOSIS — R824 Acetonuria: Secondary | ICD-10-CM | POA: Diagnosis present

## 2017-11-04 DIAGNOSIS — R7989 Other specified abnormal findings of blood chemistry: Secondary | ICD-10-CM | POA: Diagnosis not present

## 2017-11-04 DIAGNOSIS — E873 Alkalosis: Secondary | ICD-10-CM | POA: Diagnosis present

## 2017-11-04 DIAGNOSIS — Z95828 Presence of other vascular implants and grafts: Secondary | ICD-10-CM

## 2017-11-04 DIAGNOSIS — E872 Acidosis, unspecified: Secondary | ICD-10-CM

## 2017-11-04 DIAGNOSIS — Z96653 Presence of artificial knee joint, bilateral: Secondary | ICD-10-CM | POA: Diagnosis present

## 2017-11-04 DIAGNOSIS — E871 Hypo-osmolality and hyponatremia: Secondary | ICD-10-CM | POA: Diagnosis present

## 2017-11-04 DIAGNOSIS — Z8719 Personal history of other diseases of the digestive system: Secondary | ICD-10-CM

## 2017-11-04 DIAGNOSIS — E874 Mixed disorder of acid-base balance: Secondary | ICD-10-CM | POA: Diagnosis present

## 2017-11-04 DIAGNOSIS — K3184 Gastroparesis: Principal | ICD-10-CM | POA: Diagnosis present

## 2017-11-04 DIAGNOSIS — Z888 Allergy status to other drugs, medicaments and biological substances status: Secondary | ICD-10-CM

## 2017-11-04 DIAGNOSIS — D509 Iron deficiency anemia, unspecified: Secondary | ICD-10-CM | POA: Diagnosis present

## 2017-11-04 DIAGNOSIS — M549 Dorsalgia, unspecified: Secondary | ICD-10-CM | POA: Diagnosis present

## 2017-11-04 DIAGNOSIS — R740 Nonspecific elevation of levels of transaminase and lactic acid dehydrogenase [LDH]: Secondary | ICD-10-CM | POA: Diagnosis present

## 2017-11-04 DIAGNOSIS — M069 Rheumatoid arthritis, unspecified: Secondary | ICD-10-CM | POA: Diagnosis present

## 2017-11-04 DIAGNOSIS — R748 Abnormal levels of other serum enzymes: Secondary | ICD-10-CM | POA: Diagnosis present

## 2017-11-04 DIAGNOSIS — D638 Anemia in other chronic diseases classified elsewhere: Secondary | ICD-10-CM | POA: Diagnosis present

## 2017-11-04 DIAGNOSIS — R7401 Elevation of levels of liver transaminase levels: Secondary | ICD-10-CM | POA: Diagnosis present

## 2017-11-04 DIAGNOSIS — K589 Irritable bowel syndrome without diarrhea: Secondary | ICD-10-CM | POA: Diagnosis not present

## 2017-11-04 DIAGNOSIS — E860 Dehydration: Secondary | ICD-10-CM | POA: Diagnosis present

## 2017-11-04 DIAGNOSIS — T381X6A Underdosing of thyroid hormones and substitutes, initial encounter: Secondary | ICD-10-CM | POA: Diagnosis present

## 2017-11-04 DIAGNOSIS — Z79891 Long term (current) use of opiate analgesic: Secondary | ICD-10-CM | POA: Diagnosis not present

## 2017-11-04 DIAGNOSIS — R109 Unspecified abdominal pain: Secondary | ICD-10-CM | POA: Diagnosis present

## 2017-11-04 DIAGNOSIS — K5900 Constipation, unspecified: Secondary | ICD-10-CM | POA: Diagnosis present

## 2017-11-04 DIAGNOSIS — Z886 Allergy status to analgesic agent status: Secondary | ICD-10-CM

## 2017-11-04 DIAGNOSIS — M797 Fibromyalgia: Secondary | ICD-10-CM | POA: Diagnosis present

## 2017-11-04 DIAGNOSIS — E039 Hypothyroidism, unspecified: Secondary | ICD-10-CM | POA: Diagnosis present

## 2017-11-04 DIAGNOSIS — R112 Nausea with vomiting, unspecified: Secondary | ICD-10-CM

## 2017-11-04 DIAGNOSIS — N179 Acute kidney failure, unspecified: Secondary | ICD-10-CM | POA: Diagnosis present

## 2017-11-04 DIAGNOSIS — M329 Systemic lupus erythematosus, unspecified: Secondary | ICD-10-CM | POA: Diagnosis present

## 2017-11-04 DIAGNOSIS — Z885 Allergy status to narcotic agent status: Secondary | ICD-10-CM

## 2017-11-04 DIAGNOSIS — E876 Hypokalemia: Secondary | ICD-10-CM | POA: Diagnosis present

## 2017-11-04 DIAGNOSIS — Z862 Personal history of diseases of the blood and blood-forming organs and certain disorders involving the immune mechanism: Secondary | ICD-10-CM

## 2017-11-04 LAB — COMPREHENSIVE METABOLIC PANEL
ALT: 66 U/L — ABNORMAL HIGH (ref 0–44)
AST: 118 U/L — AB (ref 15–41)
Albumin: 4.1 g/dL (ref 3.5–5.0)
Alkaline Phosphatase: 188 U/L — ABNORMAL HIGH (ref 38–126)
Anion gap: 19 — ABNORMAL HIGH (ref 5–15)
BUN: 15 mg/dL (ref 6–20)
CHLORIDE: 101 mmol/L (ref 98–111)
CO2: 13 mmol/L — ABNORMAL LOW (ref 22–32)
Calcium: 8.6 mg/dL — ABNORMAL LOW (ref 8.9–10.3)
Creatinine, Ser: 1.46 mg/dL — ABNORMAL HIGH (ref 0.44–1.00)
GFR, EST AFRICAN AMERICAN: 46 mL/min — AB (ref >60)
GFR, EST NON AFRICAN AMERICAN: 40 mL/min — AB (ref >60)
Glucose, Bld: 140 mg/dL — ABNORMAL HIGH (ref 70–99)
POTASSIUM: 3.1 mmol/L — AB (ref 3.5–5.1)
Sodium: 133 mmol/L — ABNORMAL LOW (ref 135–145)
Total Bilirubin: 1.4 mg/dL — ABNORMAL HIGH (ref 0.3–1.2)
Total Protein: 7.5 g/dL (ref 6.5–8.1)

## 2017-11-04 LAB — I-STAT VENOUS BLOOD GAS, ED
Acid-base deficit: 4 mmol/L — ABNORMAL HIGH (ref 0.0–2.0)
BICARBONATE: 16.7 mmol/L — AB (ref 20.0–28.0)
O2 SAT: 57 %
PO2 VEN: 24 mmHg — AB (ref 32.0–45.0)
TCO2: 17 mmol/L — ABNORMAL LOW (ref 22–32)
pCO2, Ven: 18.3 mmHg — CL (ref 44.0–60.0)
pH, Ven: 7.568 — ABNORMAL HIGH (ref 7.250–7.430)

## 2017-11-04 LAB — URINALYSIS, ROUTINE W REFLEX MICROSCOPIC
BACTERIA UA: NONE SEEN
Bilirubin Urine: NEGATIVE
Glucose, UA: NEGATIVE mg/dL
Ketones, ur: 20 mg/dL — AB
Leukocytes, UA: NEGATIVE
Nitrite: NEGATIVE
PH: 8 (ref 5.0–8.0)
Protein, ur: NEGATIVE mg/dL
Specific Gravity, Urine: 1.013 (ref 1.005–1.030)

## 2017-11-04 LAB — LIPASE, BLOOD: LIPASE: 26 U/L (ref 11–51)

## 2017-11-04 LAB — CBC
HEMATOCRIT: 27.2 % — AB (ref 36.0–46.0)
HEMOGLOBIN: 8.8 g/dL — AB (ref 12.0–15.0)
MCH: 31.2 pg (ref 26.0–34.0)
MCHC: 32.4 g/dL (ref 30.0–36.0)
MCV: 96.5 fL (ref 78.0–100.0)
Platelets: 184 10*3/uL (ref 150–400)
RBC: 2.82 MIL/uL — ABNORMAL LOW (ref 3.87–5.11)
RDW: 15.1 % (ref 11.5–15.5)
WBC: 6.3 10*3/uL (ref 4.0–10.5)

## 2017-11-04 LAB — ACETAMINOPHEN LEVEL: Acetaminophen (Tylenol), Serum: <10 ug/mL — ABNORMAL LOW (ref 10–30)

## 2017-11-04 LAB — SALICYLATE LEVEL: Salicylate Lvl: <7 mg/dL (ref 2.8–30.0)

## 2017-11-04 LAB — I-STAT CG4 LACTIC ACID, ED: Lactic Acid, Venous: 1.74 mmol/L (ref 0.5–1.9)

## 2017-11-04 LAB — CK: Total CK: 838 U/L — ABNORMAL HIGH (ref 38–234)

## 2017-11-04 LAB — ETHANOL

## 2017-11-04 LAB — MAGNESIUM: MAGNESIUM: 2.2 mg/dL (ref 1.7–2.4)

## 2017-11-04 MED ORDER — SIMETHICONE 80 MG PO CHEW
80.00 | CHEWABLE_TABLET | ORAL | Status: DC
Start: ? — End: 2017-11-04

## 2017-11-04 MED ORDER — ONDANSETRON HCL 4 MG/2ML IJ SOLN
4.0000 mg | Freq: Once | INTRAMUSCULAR | Status: AC
  Administered 2017-11-04: 4 mg via INTRAVENOUS
  Filled 2017-11-04: qty 2

## 2017-11-04 MED ORDER — GABAPENTIN 600 MG PO TABS
600.0000 mg | ORAL_TABLET | Freq: Two times a day (BID) | ORAL | Status: DC
  Administered 2017-11-04 – 2017-11-08 (×8): 600 mg via ORAL
  Filled 2017-11-04 (×9): qty 1

## 2017-11-04 MED ORDER — LEVOTHYROXINE SODIUM 75 MCG PO TABS
150.0000 ug | ORAL_TABLET | Freq: Every day | ORAL | Status: DC
  Administered 2017-11-06 – 2017-11-09 (×4): 150 ug via ORAL
  Filled 2017-11-04 (×6): qty 2

## 2017-11-04 MED ORDER — DEXTROSE-NACL 5-0.45 % IV SOLN
INTRAVENOUS | Status: DC
  Administered 2017-11-05: via INTRAVENOUS

## 2017-11-04 MED ORDER — GENERIC EXTERNAL MEDICATION
200.00 | Status: DC
Start: 2017-11-04 — End: 2017-11-04

## 2017-11-04 MED ORDER — POLYVINYL ALCOHOL 1.4 % OP SOLN
1.0000 [drp] | Freq: Every day | OPHTHALMIC | Status: DC | PRN
  Administered 2017-11-08: 1 [drp] via OPHTHALMIC
  Filled 2017-11-04: qty 15

## 2017-11-04 MED ORDER — SODIUM CHLORIDE 0.9 % IV BOLUS
1000.0000 mL | Freq: Once | INTRAVENOUS | Status: AC
  Administered 2017-11-04: 1000 mL via INTRAVENOUS

## 2017-11-04 MED ORDER — GENERIC EXTERNAL MEDICATION
1.00 | Status: DC
Start: 2017-11-03 — End: 2017-11-04

## 2017-11-04 MED ORDER — POLYETHYLENE GLYCOL 3350 17 G PO PACK
17.00 | PACK | ORAL | Status: DC
Start: 2017-11-02 — End: 2017-11-04

## 2017-11-04 MED ORDER — GABAPENTIN 100 MG PO CAPS
100.00 | ORAL_CAPSULE | ORAL | Status: DC
Start: 2017-11-02 — End: 2017-11-04

## 2017-11-04 MED ORDER — SENNOSIDES-DOCUSATE SODIUM 8.6-50 MG PO TABS
2.0000 | ORAL_TABLET | Freq: Two times a day (BID) | ORAL | Status: DC
  Administered 2017-11-04 – 2017-11-08 (×7): 2 via ORAL
  Filled 2017-11-04 (×9): qty 2

## 2017-11-04 MED ORDER — MELATONIN 3 MG PO TABS
3.00 | ORAL_TABLET | ORAL | Status: DC
Start: 2017-11-02 — End: 2017-11-04

## 2017-11-04 MED ORDER — ONDANSETRON 4 MG PO TBDP
4.0000 mg | ORAL_TABLET | Freq: Once | ORAL | Status: AC | PRN
  Administered 2017-11-04: 4 mg via ORAL
  Filled 2017-11-04: qty 1

## 2017-11-04 MED ORDER — ENOXAPARIN SODIUM 40 MG/0.4ML ~~LOC~~ SOLN
40.00 | SUBCUTANEOUS | Status: DC
Start: 2017-11-03 — End: 2017-11-04

## 2017-11-04 MED ORDER — LEVOTHYROXINE SODIUM 50 MCG PO TABS
100.00 | ORAL_TABLET | ORAL | Status: DC
Start: 2017-11-03 — End: 2017-11-04

## 2017-11-04 MED ORDER — TRAMADOL HCL 50 MG PO TABS
50.00 | ORAL_TABLET | ORAL | Status: DC
Start: ? — End: 2017-11-04

## 2017-11-04 MED ORDER — FOLIC ACID 1 MG PO TABS
1.0000 mg | ORAL_TABLET | Freq: Every day | ORAL | Status: DC
  Administered 2017-11-07 – 2017-11-08 (×2): 1 mg via ORAL
  Filled 2017-11-04 (×4): qty 1

## 2017-11-04 MED ORDER — SODIUM CHLORIDE 0.9% FLUSH
3.0000 mL | Freq: Two times a day (BID) | INTRAVENOUS | Status: DC
  Administered 2017-11-04 – 2017-11-08 (×2): 3 mL via INTRAVENOUS

## 2017-11-04 MED ORDER — PROMETHAZINE HCL 12.5 MG PO TABS
25.00 | ORAL_TABLET | ORAL | Status: DC
Start: ? — End: 2017-11-04

## 2017-11-04 MED ORDER — VITAMIN B-12 1000 MCG PO TABS
1000.0000 ug | ORAL_TABLET | Freq: Every day | ORAL | Status: DC
  Administered 2017-11-06 – 2017-11-08 (×3): 1000 ug via ORAL
  Filled 2017-11-04 (×5): qty 1

## 2017-11-04 MED ORDER — ACETAMINOPHEN 500 MG PO TABS
1000.00 | ORAL_TABLET | ORAL | Status: DC
Start: 2017-11-02 — End: 2017-11-04

## 2017-11-04 MED ORDER — HYDROMORPHONE HCL 1 MG/ML IJ SOLN
0.5000 mg | Freq: Four times a day (QID) | INTRAMUSCULAR | Status: AC | PRN
  Administered 2017-11-04 – 2017-11-05 (×3): 0.5 mg via INTRAVENOUS
  Filled 2017-11-04 (×3): qty 1

## 2017-11-04 MED ORDER — ACETAMINOPHEN 650 MG RE SUPP: 650.0000 mg | Freq: Four times a day (QID) | RECTAL | Status: DC | PRN

## 2017-11-04 MED ORDER — ACETAMINOPHEN 325 MG PO TABS
650.0000 mg | ORAL_TABLET | Freq: Four times a day (QID) | ORAL | Status: DC | PRN
  Administered 2017-11-04 – 2017-11-09 (×4): 650 mg via ORAL
  Filled 2017-11-04 (×5): qty 2

## 2017-11-04 MED ORDER — SODIUM CHLORIDE 0.9 % IV SOLN
Freq: Once | INTRAVENOUS | Status: AC
  Administered 2017-11-04: 19:00:00 via INTRAVENOUS

## 2017-11-04 MED ORDER — POTASSIUM CHLORIDE 10 MEQ/100ML IV SOLN
10.0000 meq | INTRAVENOUS | Status: AC
  Administered 2017-11-04 – 2017-11-05 (×6): 10 meq via INTRAVENOUS
  Filled 2017-11-04 (×5): qty 100

## 2017-11-04 MED ORDER — ONDANSETRON HCL 4 MG/2ML IJ SOLN
4.0000 mg | Freq: Four times a day (QID) | INTRAMUSCULAR | Status: DC | PRN
  Administered 2017-11-05 (×3): 4 mg via INTRAVENOUS
  Filled 2017-11-04 (×2): qty 2

## 2017-11-04 MED ORDER — ENOXAPARIN SODIUM 40 MG/0.4ML ~~LOC~~ SOLN
40.0000 mg | SUBCUTANEOUS | Status: DC
  Administered 2017-11-04 – 2017-11-07 (×4): 40 mg via SUBCUTANEOUS
  Filled 2017-11-04 (×5): qty 0.4

## 2017-11-04 MED ORDER — RAMELTEON 8 MG PO TABS
8.0000 mg | ORAL_TABLET | Freq: Every evening | ORAL | Status: DC | PRN
  Administered 2017-11-04 – 2017-11-06 (×3): 8 mg via ORAL
  Filled 2017-11-04 (×5): qty 1

## 2017-11-04 MED ORDER — ONDANSETRON HCL 4 MG PO TABS: 4.0000 mg | ORAL_TABLET | Freq: Four times a day (QID) | ORAL | Status: DC | PRN

## 2017-11-04 MED ORDER — CIPROFLOXACIN HCL 500 MG PO TABS
500.00 | ORAL_TABLET | ORAL | Status: DC
Start: 2017-11-02 — End: 2017-11-04

## 2017-11-04 MED ORDER — THERA-M PO TABS
1.00 | ORAL_TABLET | ORAL | Status: DC
Start: 2017-11-03 — End: 2017-11-04

## 2017-11-04 MED ORDER — POLYETHYLENE GLYCOL 3350 17 G PO PACK
17.0000 g | PACK | Freq: Every day | ORAL | Status: DC
  Administered 2017-11-06: 17 g via ORAL
  Filled 2017-11-04 (×4): qty 1

## 2017-11-04 MED ORDER — METOCLOPRAMIDE HCL 5 MG/ML IJ SOLN
10.0000 mg | Freq: Once | INTRAMUSCULAR | Status: AC
  Administered 2017-11-04: 10 mg via INTRAVENOUS
  Filled 2017-11-04: qty 2

## 2017-11-04 MED ORDER — DULOXETINE HCL 20 MG PO CPEP
20.00 | ORAL_CAPSULE | ORAL | Status: DC
Start: 2017-11-03 — End: 2017-11-04

## 2017-11-04 MED ORDER — METRONIDAZOLE 500 MG PO TABS
500.00 | ORAL_TABLET | ORAL | Status: DC
Start: 2017-11-02 — End: 2017-11-04

## 2017-11-04 NOTE — H&P (Addendum)
Date: 11/05/2017               Patient Name:  Adrienne Flores MRN: [id number]  DOB: 09/03/1965 Age / Sex: 52 y.o., female   PCP: Adrienne Pilar, MD         Medical Service: Internal Medicine Teaching Service         Attending Physician: Dr. Lalla Brothers    First Contact: Dr. Koleen Distance Pager: [phone]  Second Contact: Dr. Berline Lopes Pager: [phone]       After Hours (After 5p/  First Contact Pager: [phone]  weekends / holidays): Second Contact Pager: [phone]   Chief Complaint: abdominal pain   History of Present Illness:  Adrienne Flores is a 52 yo female with PMH recurrent abdominal pain, gastroparesis, diverticulitis, chronic back pain, rheumatoid arthritis, hypothyroidism, and AKI presenting with nausea and vomiting that began this morning. She is accompanied by her husband who provided much of the history as patient had difficulty communicating due to pain. She has been struggling with recurrent abdominal pain and nausea for the past 8 years after her grandson had a GI illness she caught. Her husband states she has been intermittently sick ever since. Due to recurrent ED visits and dehydration, she is a difficult stick and has a right port-a-cath. She describes the pain as if someone is punching her from the inside. She has had nausea but has vomited very little as she has not been eating. She has chronic abdominal pain although goes through periods of not feeling terrible.  Prior to this admission she most recently was admitted to [facility] on 9/17 for gastroparesis with colitis on CT, encephalopathy and hypotension with symptom relief after SMOG enema. She was discharged on antibiotics but has been unable to pick them up yet.   She eats about one meal per day and drinks ice tea at home. She states there does not seem to be a pattern to the pain but that is occurs randomly. She states she has been having regular bowel movements since discharge from [facility] that are dark brown and has not  been constipated recently although CT scan this admission did show significant stool burden. She sees [facility] for pain management for her abdominal pain and back pain. She was most recently placed on tramadol 66m tid, which she states has not been helping. She has an appointment with the pain clinic on 9/26. She denies dizziness, numbness, headache, and chest pain. She denies dysuria or changes in urination. She does endorse SOB but states this is present at baseline. Her husband states that she spends her time on the sofa and has a commode next to the sofa and rarely ambulates farther than this. She states her RA has recently been well controlled and has not seen a rheumatologist in years although she has very limited mobility due to back pain. She denies allergies or previous allergic reactions to anything.     Meds:  Excedrin PM [id number] acid 184mtablet neurontin 600103mid Synthroid 150 mcg qd Naproxen 220 mg bid prn pain Tizanidine 4mg57md B-12 1000 mcg qd   Allergies: Allergies as of 11/04/2017 - Review Complete 11/04/2017  Allergen Reaction Noted  . Morphine Hives 12/10/2012  . Morphine and related Hives and Itching 12/02/2012  . Methotrexate derivatives Other (See Comments) 11/10/2012  . Nabumetone Other (See Comments) 08/31/2012  . Methotrexate Other (See Comments) 06/04/2012  . Haloperidol Nausea Only and Other (See Comments) 01/12/2017   Past Medical  History:  Diagnosis Date  . Anemia   . At high risk for falls   . Chronic abdominal pain 2008   dates back to at least 2008.  multiple ultrasound, CT scans, x rays at [facility], [place].  [facility] colonoscopy, egd unrevealing.    . Colitis 2015   ? colitis per CTs in 2015, 2016 and 11/2015.  presumed infectious.  colonoscopy in 2015 negative for colitis.   . Constipation   . Cyclic vomiting syndrome 2015   per opinion  of Dr Granville Lewis, IBS specialist at [facility].   . Degenerative disc disease   . Depression     . Fibromyalgia   . Gastroparesis 03/2011   markedly abnormal gastric emptying study.   . Hematemesis 11/2015   mild to moderate blood in emesis appeared after 2 plus weeks of frequent, non-bloody emesis.   [name] Hemorrhoids   . Hemorrhoids   . History of chicken pox   . Hypokalemia   . Hypothyroid   . Lupus ([place])   . 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 Rheumatoid arthritis [facility])    Surgical History: Cholecystectomy   Family History:  Family History  Problem Relation Age of Onset  . Leukemia Mother   . Arthritis Mother   . Early death Mother   . Miscarriages / [place] Mother   . Hypothyroidism Sister   . Cancer Paternal Grandmother   . Cancer Paternal Grandfather   . Hypothyroidism Brother    She states her mother had a rare cancer when she was pregnant with her. She was not treated until after delivering her daughter.   Social History:  Patient is disabled and lives at home with her husband. She does not get around much and spends most of her time on the sofa with a commode near as she has difficulty and pain with walking.  She denies tobacco use, alcohol, or recreational drug use.  Family does not have a lot of money and husband is requesting she not be discharged until Wednesday as he cannot afford gas to come get her.   Review of Systems: A complete ROS was negative except as per HPI.   Physical Exam: Blood pressure 124/85, pulse 78, temperature 99.3 F (37.4 C), temperature source Oral, resp. rate 20, height 5' 1"  (1.549 m), weight 71.2 kg, SpO2 100 %.   Constitution: moderate distress, pale, supine in bed, obese HEENT: /AT, no scleral icterus, EOM intact Cardio: regular rate & rhythm, no m/r/g,  Respiratory: clear to auscultation, no wheezing, rhonci, rales  Abdominal: hypoactive BS, midline surgical scar, diffuse, extreme TTP making it difficult to complete abdominal exam, +voluntary guarding, soft, non-distended, +right CVA tenderness MSK: no lower or upper extremity  edema Neuro: A&Ox3, tearful Skin: papery and thin  Past Labs & Imaging:  RF:  Positive 2007  158.9 2016   ANA: Positive ANA 2014 Negative ANA 06/09/17 and 2017  ACTH Normal 2013   EGD 05/10/17:  Diffuse gastritis with normal duodenum   Pathology wnl  Hepatic Function Latest Ref Rng & Units 11/04/2017 06/09/2017 06/08/2017  Total Protein 6.5 - 8.1 g/dL 7.5 6.8 6.7  Albumin 3.5 - 5.0 g/dL 4.1 3.3(L) 3.4(L)  AST 15 - 41 U/L 118(H) 50(H) 60(H)  ALT 0 - 44 U/L 66(H) 50 59(H)  Alk Phosphatase 38 - 126 U/L 188(H) 85 87  Total Bilirubin 0.3 - 1.2 mg/dL 1.4(H) 0.7 0.5  Bilirubin, Direct 0.1 - 0.5 mg/dL - - -   Istat VBG: PH 7.56 PCO2  18.3 PO2 24 Acid-base deficit 4 HCO3 16.7  Repeat VBG: PH 7.53 PCO2 25 PO2 -- Acid-base deficit 1.1 HCO3 21.3   CT Abdomen 11/04/2017: IMPRESSION: 1. Subtle fluid stranding within the LEFT lower quadrant, and just proximal to the LEFT inguinal canal, of uncertain etiology or significance. Any recent surgery in this area? Recent femoral catheter insertion? If not, this could represent sequela of mild colitis or epiploic appendagitis. If symptoms persisted or worsened, or if symptoms became localized to the LEFT lower quadrant, would consider further characterization with contrast enhanced study. 2. Remainder of the abdomen and pelvis CT is unremarkable. No bowel obstruction. No evidence of acute solid organ abnormality. No renal or ureteral calculi. Appendix is normal. Moderate amount of stool within the colon (constipation?). Electronically Signed   By: Franki Cabot M.D.   On: 11/04/2017 18:38   Assessment & Plan by Problem: Principal Problem:   Abdominal pain Active Problems:   Hypothyroidism   AKI (acute kidney injury) ([place])   Transaminitis  52 yo female with PMH recurrent abdominal pain, gastroparesis, diverticulitis, chronic back pain, rheumatoid arthritis, hypothyroidism, and CKD presenting with nausea and vomiting. These  symptoms are recurrent and as of yet there has not been a definitive diagnosis found. Past diagnosis includes intermittent porphyria but PBG and other workup has been negative for this. Previous ANA negatvex2 with one positive slightly positive ANA in 2014. This is likely related to her RA.     Chronic Abdominal Pain  AKI and Metabolic Abnormality Secondary to Decreased PO intake Anion gap with metabolic abnormalities including hyponatremia, hypokalemia, and hypocalcemia and UA positive for ketones. This could be related to limited po intake and starvation ketoacidosis as husband states she only eats once per day. She also has previous immunofixation with elevated IgA lambda light chain specificity on 06/09/2017, which can be elevated secondary to RA and can lead to elevated anion gap without metabolic acidosis. Differential for her abdominal symptoms include gastroparesis, constipation as seen on CT, and recent colitis. These symptoms could also be exacerbated her hypothyroidism as she is not compliant with synthroid.   - acetaminophen, salicylate levels normal, Magnesium wnl, UDS negative   - replete K  - D5 1/2 NS 100 cc/hr  - am BMP, CBC - dilaudid .3m q6h - PT/OT  Chronic Respiratory Alkalosis with Elevated Anion Gap PH 7.539, pCO2 25 Serum HCO3 13  VBG and repeat VBG show elevated pH and decreased CO2 with decreased serum bicarb consistent with chronic compensation. She endorses mild baseline SOB but was not tachypnic at admission. She is low risk for PE, salicylate level is wnl. Chest xray was not obtained as she is not showing signs of pneumonia or other indication. Her symptoms could be related deconditioning secondary to her sedentary state.   Recurrent Transaminitis: Cholecystectomy 2015. Alk phos, AST, ALT all elevated. Previous intermittent elevation although levels have not been this high in the past. Her transaminitis is possibly secondary to [facility]fusion [facility]   Iron Deficiency Anemia: History of iron deficiency anemia. Chronic normocytic anemia could also be secondary to her RA.   - ferritin ordered   Hypothyroidism: History of medical noncompliance. TSH 54.17.   - cont. Synthroid   Diet: NPO VTE: lovenox IVF: D5 1/2 NS 100 cc/hr Code: Full   Dispo: Admit patient to Inpatient with expected length of stay greater than 2 midnights.  Signed:Marty Heck DO 11/05/2017, 4:36 AM  Pager: [phone]

## 2017-11-04 NOTE — ED Triage Notes (Signed)
Pt c/o of abdominal pain with nausea but no vomiting. Hx of colitis. Denies fever, diarrhea or blood in stools.

## 2017-11-04 NOTE — ED Notes (Signed)
Pt. Ambulated to bathroom with steady gait.  

## 2017-11-04 NOTE — ED Notes (Signed)
Pt. Updated on plan of care.  Warm blanket given.

## 2017-11-04 NOTE — ED Provider Notes (Signed)
[place] EMERGENCY DEPARTMENT Provider Note   CSN: [id number] Arrival date & time: 11/04/17  1222     History   Chief Complaint Chief Complaint  Patient presents with  . Abdominal Pain  . Nausea    HPI Adrienne Flores is a 52 y.o. female.  The history is provided by the patient.  Abdominal Pain   This is a recurrent problem. The current episode started more than 2 days ago. The problem occurs daily. The problem has been gradually worsening. The pain is associated with eating. The pain is located in the generalized abdominal region. The quality of the pain is aching and dull. The pain is at a severity of 6/10. The pain is moderate. Associated symptoms include anorexia, nausea and vomiting. Pertinent negatives include fever, belching, diarrhea, flatus, constipation, dysuria, frequency, hematuria, headaches, arthralgias and myalgias. Nothing aggravates the symptoms. Nothing relieves the symptoms. Past workup includes GI consult. Past medical history comments: Gastroparesis, recently admitted at [facility] for colitis and on flagyl/cipro.    Past Medical History:  Diagnosis Date  . Anemia   . At high risk for falls   . Chronic abdominal pain 2008   dates back to at least 2008.  multiple ultrasound, CT scans, x rays at [facility], [place].  [facility] colonoscopy, egd unrevealing.    . Colitis 2015   ? colitis per CTs in 2015, 2016 and 11/2015.  presumed infectious.  colonoscopy in 2015 negative for colitis.   . Constipation   . Cyclic vomiting syndrome 2015   per opinion  of Dr Granville Lewis, IBS specialist at [facility].   . Degenerative disc disease   . Depression   . Fibromyalgia   . Gastroparesis 03/2011   markedly abnormal gastric emptying study.   . Hematemesis 11/2015   mild to moderate blood in emesis appeared after 2 plus weeks of frequent, non-bloody emesis.   [name] Hemorrhoids   . Hemorrhoids   . History of chicken pox   . Hypokalemia   . Hypothyroid   . Lupus  ([place])   . 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 Rheumatoid arthritis [facility])     Patient Active Problem List   Diagnosis Date Noted  . Hypotension 06/08/2017  . Hypokalemia 06/08/2017  . Mechanical complication of vascular device 05/16/2017  . 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 Loss of weight   . Abdominal pain, generalized   . Gastritis without bleeding   . Iron deficiency anemia 07/02/2016  . Disoriented to time 01/24/2016  . Protein-calorie malnutrition, severe 12/12/2015  . Symptomatic anemia 12/09/2015  . Acute hyponatremia 12/09/2015  . Hematemesis 12/09/2015  . Chronic hoarseness 12/09/2015  . Unintended weight loss 12/09/2015  . H/O medication noncompliance 12/07/2015  . Chronic lower back pain 08/30/2014  . Rheu arthrit of right ank/ft w involv of organs and systems ([place]) 06/16/2014  . DDD (degenerative disc disease), cervical 06/16/2014  . 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 DDD (degenerative disc disease), lumbosacral 06/16/2014  . Diarrhea 08/22/2013  . Hypokalemia due to inadequate potassium intake 08/22/2013  . Chronic abdominal and back  pain 07/26/2013  . Nausea and vomiting 07/26/2013  . 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 Rheumatoid arthritis ([place]) 07/26/2013  . Acute on chronic renal failure ([place]) 06/27/2013  . AKI (acute kidney injury) ([place]) 06/25/2013  . Transaminitis 06/25/2013  . Abdominal pain 11/26/2012  . Hypothyroidism 11/26/2012  . Hyperlipidemia 11/03/2012  . Lightheadedness 10/26/2012  . Other specified abnormal immunological findings in serum 10/01/2012  .  Noninfectious gastroenteritis and colitis 01/19/2012  . Primary localized osteoarthrosis of hand 04/17/2011  . Cyclic vomiting syndrome 09/14/2010  . Mixed urge and stress incontinence 08/01/2010  . Depressive disorder 12/20/2009    Past Surgical History:  Procedure Laterality Date  . ABDOMINAL HYSTERECTOMY     total  . CESAREAN SECTION     x2  . CHOLECYSTECTOMY    . COLONOSCOPY  11/2013    at [facility] Dr Kennith Gain. Hemorrhoids. sigmoid tics. random biopsies negative for microscopic colitis or any pathology.   . COLONOSCOPY WITH PROPOFOL N/A 05/10/2017   Procedure: COLONOSCOPY WITH PROPOFOL;  Surgeon: Lucilla Lame, MD;  Location: [place];  Service: Endoscopy;  Laterality: N/A;  . ESOPHAGOGASTRODUODENOSCOPY  11/2013   Dr Kennith Gain at [facility].  Normal study.  Duodenal bx negative for villous atrophy.   . ESOPHAGOGASTRODUODENOSCOPY N/A 12/09/2015   Procedure: ESOPHAGOGASTRODUODENOSCOPY (EGD);  Surgeon: Doran Stabler, MD;  Location: [facility] ENDOSCOPY;  Service: Endoscopy;  Laterality: N/A;  . ESOPHAGOGASTRODUODENOSCOPY (EGD) WITH PROPOFOL N/A 05/10/2017   Procedure: ESOPHAGOGASTRODUODENOSCOPY (EGD) WITH PROPOFOL;  Surgeon: Lucilla Lame, MD;  Location: [place];  Service: Endoscopy;  Laterality: N/A;  . KNEE SURGERY Left   . OOPHORECTOMY    . PORTA CATH INSERTION N/A 04/18/2017   Procedure: PORTA CATH INSERTION;  Surgeon: Katha Cabal, MD;  Location: [place] CV LAB;  Service: Cardiovascular;  Laterality: N/A;  . PORTA CATH INSERTION Right 05/21/2017   Procedure: PORTA CATH INSERTION;  Surgeon: Katha Cabal, MD;  Location: [place] CV LAB;  Service: Cardiovascular;  Laterality: Right;  . REPLACEMENT TOTAL KNEE Bilateral      OB History   None      Home Medications    Prior to Admission medications   Medication Sig Start Date End Date Taking? Authorizing Provider  Carboxymethylcellul-Glycerin (LUBRICATING EYE DROPS OP) Place 1 drop into both eyes daily as needed (allergies).    [provider]  diphenhydrAMINE-APAP, sleep, (EXCEDRIN PM) 38-500 MG TABS Take 1 tablet by mouth at bedtime as needed (sleep).    [provider]  folic acid (FOLVITE) 1 MG tablet Take 1 tablet (1 mg total) by mouth daily. 06/13/17   Max Sane, MD  gabapentin (NEURONTIN) 600 MG tablet Take 600 mg by mouth 2 (two) times daily.  08/01/10   [provider]    levothyroxine (SYNTHROID, LEVOTHROID) 150 MCG tablet Take 1 tablet (150 mcg total) by mouth daily before breakfast. 06/13/17   Max Sane, MD  naproxen sodium (ALEVE) 220 MG tablet Take 220 mg by mouth 2 (two) times daily as needed (pain).    [provider]  tiZANidine (ZANAFLEX) 4 MG tablet Take 1 tablet by mouth 3 (three) times daily. 06/04/17   [provider]  vitamin B-12 (CYANOCOBALAMIN) 1000 MCG tablet Take 1 tablet (1,000 mcg total) by mouth daily. 06/12/17   Max Sane, MD    Family History Family History  Problem Relation Age of Onset  . Leukemia Mother   . Arthritis Mother   . Early death Mother   . Miscarriages / [place] Mother   . Hypothyroidism Sister   . Cancer Paternal Grandmother   . Cancer Paternal Grandfather   . Hypothyroidism Brother     Social History Social History   Tobacco Use  . Smoking status: Never Smoker  . Smokeless tobacco: Never Used  Substance Use Topics  . Alcohol use: No  . Drug use: No     Allergies   Morphine;  Morphine and related; Methotrexate derivatives; Nabumetone; Methotrexate; and Haloperidol   Review of Systems Review of Systems  Constitutional: Negative for chills and fever.  HENT: Negative for ear pain and sore throat.   Eyes: Negative for pain and visual disturbance.  Respiratory: Negative for cough and shortness of breath.   Cardiovascular: Negative for chest pain and palpitations.  Gastrointestinal: Positive for abdominal pain, anorexia, nausea and vomiting. Negative for constipation, diarrhea and flatus.  Genitourinary: Negative for dysuria, frequency and hematuria.  Musculoskeletal: Negative for arthralgias, back pain and myalgias.  Skin: Negative for color change and rash.  Neurological: Negative for seizures, syncope and headaches.  All other systems reviewed and are negative.    Physical Exam Updated Vital Signs  ED Triage Vitals  Enc Vitals Group     BP 11/04/17 1246 (!) 128/102      Pulse Rate 11/04/17 1246 100     Resp 11/04/17 1246 (!) 21     Temp 11/04/17 1246 98.4 F (36.9 C)     Temp Source 11/04/17 1246 Oral     SpO2 11/04/17 1246 97 %     Weight --      Height --      Head Circumference --      Peak Flow --      Pain Score 11/04/17 1247 10     Pain Loc --      Pain Edu? --      Excl. in [place]? --     Physical Exam  Constitutional: She appears well-developed and well-nourished. She appears distressed.  HENT:  Head: Normocephalic and atraumatic.  Eyes: Pupils are equal, round, and reactive to light. Conjunctivae and EOM are normal.  Neck: Neck supple.  Cardiovascular: Normal rate, regular rhythm, normal heart sounds and intact distal pulses.  No murmur heard. Pulmonary/Chest: Effort normal and breath sounds normal. No respiratory distress.  Abdominal: Soft. There is generalized tenderness.  Musculoskeletal: She exhibits no edema.  Neurological: She is alert.  Skin: Skin is warm and dry. Capillary refill takes less than 2 seconds.  Psychiatric: She has a normal mood and affect.  Nursing note and vitals reviewed.    ED Treatments / Results  Labs (all labs ordered are listed, but only abnormal results are displayed) Labs Reviewed  COMPREHENSIVE METABOLIC PANEL - Abnormal; Notable for the following components:      Result Value   Sodium 133 (*)    Potassium 3.1 (*)    CO2 13 (*)    Glucose, Bld 140 (*)    Creatinine, Ser 1.46 (*)    Calcium 8.6 (*)    AST 118 (*)    ALT 66 (*)    Alkaline Phosphatase 188 (*)    Total Bilirubin 1.4 (*)    GFR calc non Af Amer 40 (*)    GFR calc Af Amer 46 (*)    Anion gap 19 (*)    All other components within normal limits  URINALYSIS, ROUTINE W REFLEX MICROSCOPIC - Abnormal; Notable for the following components:   Hgb urine dipstick SMALL (*)    Ketones, ur 20 (*)    All other components within normal limits  CBC - Abnormal; Notable for the following components:   RBC 2.82 (*)    Hemoglobin 8.8 (*)     HCT 27.2 (*)    All other components within normal limits  CK - Abnormal; Notable for the following components:   Total CK 838 (*)    All other components  within normal limits  TSH - Abnormal; Notable for the following components:   TSH 54.170 (*)    All other components within normal limits  ACETAMINOPHEN LEVEL - Abnormal; Notable for the following components:   Acetaminophen (Tylenol), Serum <10 (*)    All other components within normal limits  BLOOD GAS, VENOUS - Abnormal; Notable for the following components:   pH, Ven 7.539 (*)    pCO2, Ven 25.0 (*)    All other components within normal limits  I-STAT VENOUS BLOOD GAS, ED - Abnormal; Notable for the following components:   pH, Ven 7.568 (*)    pCO2, Ven 18.3 (*)    pO2, Ven 24.0 (*)    Bicarbonate 16.7 (*)    TCO2 17 (*)    Acid-base deficit 4.0 (*)    All other components within normal limits  LIPASE, BLOOD  ETHANOL  SALICYLATE LEVEL  MAGNESIUM  I-STAT CG4 LACTIC ACID, ED    EKG None  Radiology Ct Abdomen Pelvis Wo Contrast  Result Date: 11/04/2017 CLINICAL DATA:  Abdominal pain and vomiting since yesterday morning. EXAM: CT ABDOMEN AND PELVIS WITHOUT CONTRAST TECHNIQUE: Multidetector CT imaging of the abdomen and pelvis was performed following the standard protocol without IV contrast. COMPARISON:  CT abdomen dated 04/06/2017. FINDINGS: Lower chest: No acute abnormality. Hepatobiliary: No focal liver abnormality is seen. Status post cholecystectomy. No biliary dilatation. Pancreas: Diminutive and/or infiltrated with fat but otherwise unremarkable. Spleen: Normal in size without focal abnormality. Adrenals/Urinary Tract: Adrenal glands appear normal. Kidneys are unremarkable without mass, stone or hydronephrosis. No perinephric inflammation seen. No ureteral or bladder calculi seen. Bladder appears normal, partially decompressed. Stomach/Bowel: No dilated large or small bowel loops. No bowel wall thickening or evidence of  bowel wall inflammation seen. Stomach is unremarkable, partially decompressed. Appendix is normal. Moderate amount of stool throughout the colon. Vascular/Lymphatic: Aortic atherosclerosis. No enlarged abdominal or pelvic lymph nodes. Reproductive: Status post hysterectomy. No adnexal mass or free fluid. Other: Mild fluid stranding within the LEFT lower quadrant, new compared to the previous study of 20/25/4270, of uncertain etiology. Musculoskeletal: No acute or suspicious osseous finding. Mild degenerative spondylitic changes within the slightly scoliotic lumbar spine. IMPRESSION: 1. Subtle fluid stranding within the LEFT lower quadrant, and just proximal to the LEFT inguinal canal, of uncertain etiology or significance. Any recent surgery in this area? Recent femoral catheter insertion? If not, this could represent sequela of mild colitis or epiploic appendagitis. If symptoms persisted or worsened, or if symptoms became localized to the LEFT lower quadrant, would consider further characterization with contrast enhanced study. 2. Remainder of the abdomen and pelvis CT is unremarkable. No bowel obstruction. No evidence of acute solid organ abnormality. No renal or ureteral calculi. Appendix is normal. Moderate amount of stool within the colon (constipation?). Electronically Signed   By: Franki Cabot M.D.   On: 11/04/2017 18:38    Procedures Procedures (including critical care time)  Medications Ordered in ED Medications  HYDROmorphone (DILAUDID) injection 0.5 mg (0.5 mg Intravenous Given 11/04/17 2144)  levothyroxine (SYNTHROID, LEVOTHROID) tablet 150 mcg (has no administration in time range)  folic acid (FOLVITE) tablet 1 mg (has no administration in time range)  vitamin B-12 (CYANOCOBALAMIN) tablet 1,000 mcg (has no administration in time range)  gabapentin (NEURONTIN) tablet 600 mg (600 mg Oral Given 11/04/17 2258)  polyvinyl alcohol (LIQUIFILM TEARS) 1.4 % ophthalmic solution 1 drop (has no  administration in time range)  enoxaparin (LOVENOX) injection 40 mg (40 mg Subcutaneous Given 11/04/17  2258)  sodium chloride flush (NS) 0.9 % injection 3 mL (3 mLs Intravenous Given 11/04/17 2306)  acetaminophen (TYLENOL) tablet 650 mg (650 mg Oral Given 11/04/17 2302)    Or  acetaminophen (TYLENOL) suppository 650 mg ( Rectal See Alternative 11/04/17 2302)  polyethylene glycol (MIRALAX / GLYCOLAX) packet 17 g (has no administration in time range)  senna-docusate (Senokot-S) tablet 2 tablet (2 tablets Oral Given 11/04/17 2258)  ondansetron (ZOFRAN) tablet 4 mg (has no administration in time range)    Or  ondansetron (ZOFRAN) injection 4 mg (has no administration in time range)  ramelteon (ROZEREM) tablet 8 mg (8 mg Oral Given 11/04/17 2302)  potassium chloride 10 mEq in 100 mL IVPB (10 mEq Intravenous New Bag/Given 11/05/17 0008)  dextrose 5 %-0.45 % sodium chloride infusion ( Intravenous New Bag/Given 11/05/17 0006)  ondansetron (ZOFRAN-ODT) disintegrating tablet 4 mg (4 mg Oral Given 11/04/17 1255)  metoCLOPramide (REGLAN) injection 10 mg (10 mg Intravenous Given 11/04/17 1655)  sodium chloride 0.9 % bolus 1,000 mL (0 mLs Intravenous Stopped 11/04/17 1826)  ondansetron (ZOFRAN) injection 4 mg (4 mg Intravenous Given 11/04/17 1925)  0.9 %  sodium chloride infusion ( Intravenous New Bag/Given 11/04/17 1927)     Initial Impression / Assessment and Plan / ED Course  I have reviewed the triage vital signs and the nursing notes.  Pertinent labs & imaging results that were available during my care of the patient were reviewed by me and considered in my medical decision making (see chart for details).     Adrienne Flores is a 52 year old female history of gastroparesis, rheumatoid arthritis, cyclical vomiting syndrome who presents to the ED with abdominal pain, nausea, vomiting.  Patient with unremarkable vitals.  No fever.  Patient recently discharged from [facility] for similar symptoms in which she was  treated with IV antibiotics for colitis.  Patient had 1 more Flores of Cipro and Flagyl.  She was discharged 2 days ago.  She states she tried to advance her diet yesterday and has had nausea and vomiting ever since.  She has history of gastroparesis and according to chart review has had gastric emptying study to confirm this.  She used to be on chronic narcotics which appeared to complicate her symptoms.  She is only on tramadol at this time.  Patient with diffuse abdominal pain on exam but overall exam is unremarkable.  She is not actively having nausea upon my evaluation.  She denies any chest pain, shortness of breath.  Will get labs including CT abdomen and pelvis.  Will give IV fluids, IV Zofran, IV Reglan.  Patient with overall unremarkable CT scan of abdomen and pelvis.  Possibly mild colitis remaining.  No abscesses.  Patient with elevation of creatinine likely from dehydration.  Patient with hypokalemia, elevated liver enzymes, elevated anion gap.  Likely suspect all of these are from dehydration, nausea, vomiting.  Glucose unremarkable.  No concern for diabetic ketoacidosis.  Patient alkalotic on pH.  Normal lipase and doubt pancreatitis.  Negative alcohol level.  Suspect patient with ongoing gastroparesis type symptoms causing multiple metabolic derangements.  Started on IV maintenance fluids and admitted to medicine service for further care.  Hemodynamically stable throughout my care. Will defer further colits abx to inpatient team.   This chart was dictated using voice recognition software.  Despite best efforts to proofread,  errors can occur which can change the documentation meaning.   Final Clinical Impressions(s) / ED Diagnoses   Final diagnoses:  Intractable vomiting with  nausea, unspecified vomiting type  AKI (acute kidney injury) [place])  Metabolic acidosis    ED Discharge Orders    None       Lennice Sites, DO 11/05/17 [phone]

## 2017-11-04 NOTE — Progress Notes (Signed)
Pt received from ED accompanied by husband. Alert and oriented x4. Oriented to room and call bell.

## 2017-11-05 DIAGNOSIS — G8929 Other chronic pain: Secondary | ICD-10-CM

## 2017-11-05 DIAGNOSIS — E873 Alkalosis: Secondary | ICD-10-CM

## 2017-11-05 DIAGNOSIS — M549 Dorsalgia, unspecified: Secondary | ICD-10-CM

## 2017-11-05 DIAGNOSIS — D509 Iron deficiency anemia, unspecified: Secondary | ICD-10-CM

## 2017-11-05 DIAGNOSIS — K589 Irritable bowel syndrome without diarrhea: Secondary | ICD-10-CM

## 2017-11-05 DIAGNOSIS — K3184 Gastroparesis: Principal | ICD-10-CM

## 2017-11-05 DIAGNOSIS — R7989 Other specified abnormal findings of blood chemistry: Secondary | ICD-10-CM

## 2017-11-05 DIAGNOSIS — Z885 Allergy status to narcotic agent status: Secondary | ICD-10-CM

## 2017-11-05 DIAGNOSIS — Z8719 Personal history of other diseases of the digestive system: Secondary | ICD-10-CM

## 2017-11-05 DIAGNOSIS — Z9049 Acquired absence of other specified parts of digestive tract: Secondary | ICD-10-CM

## 2017-11-05 DIAGNOSIS — R748 Abnormal levels of other serum enzymes: Secondary | ICD-10-CM

## 2017-11-05 DIAGNOSIS — N189 Chronic kidney disease, unspecified: Secondary | ICD-10-CM

## 2017-11-05 DIAGNOSIS — N179 Acute kidney failure, unspecified: Secondary | ICD-10-CM

## 2017-11-05 DIAGNOSIS — E039 Hypothyroidism, unspecified: Secondary | ICD-10-CM

## 2017-11-05 DIAGNOSIS — E860 Dehydration: Secondary | ICD-10-CM

## 2017-11-05 DIAGNOSIS — Z888 Allergy status to other drugs, medicaments and biological substances status: Secondary | ICD-10-CM

## 2017-11-05 DIAGNOSIS — Z95828 Presence of other vascular implants and grafts: Secondary | ICD-10-CM

## 2017-11-05 DIAGNOSIS — Z79890 Hormone replacement therapy (postmenopausal): Secondary | ICD-10-CM

## 2017-11-05 DIAGNOSIS — R740 Nonspecific elevation of levels of transaminase and lactic acid dehydrogenase [LDH]: Secondary | ICD-10-CM

## 2017-11-05 DIAGNOSIS — M069 Rheumatoid arthritis, unspecified: Secondary | ICD-10-CM

## 2017-11-05 LAB — COMPREHENSIVE METABOLIC PANEL
ALBUMIN: 3.6 g/dL (ref 3.5–5.0)
ALK PHOS: 162 U/L — AB (ref 38–126)
ALT: 48 U/L — ABNORMAL HIGH (ref 0–44)
AST: 74 U/L — AB (ref 15–41)
Anion gap: 11 (ref 5–15)
BILIRUBIN TOTAL: 1.1 mg/dL (ref 0.3–1.2)
BUN: 8 mg/dL (ref 6–20)
CALCIUM: 8.2 mg/dL — AB (ref 8.9–10.3)
CO2: 19 mmol/L — ABNORMAL LOW (ref 22–32)
Chloride: 103 mmol/L (ref 98–111)
Creatinine, Ser: 1.09 mg/dL — ABNORMAL HIGH (ref 0.44–1.00)
GFR calc Af Amer: >60 mL/min (ref >60)
GFR calc non Af Amer: 56 mL/min — ABNORMAL LOW (ref >60)
GLUCOSE: 84 mg/dL (ref 70–99)
Potassium: 3.9 mmol/L (ref 3.5–5.1)
Sodium: 133 mmol/L — ABNORMAL LOW (ref 135–145)
TOTAL PROTEIN: 6.7 g/dL (ref 6.5–8.1)

## 2017-11-05 LAB — CBC
HEMATOCRIT: 25.6 % — AB (ref 36.0–46.0)
HEMOGLOBIN: 8.4 g/dL — AB (ref 12.0–15.0)
MCH: 31.6 pg (ref 26.0–34.0)
MCHC: 32.8 g/dL (ref 30.0–36.0)
MCV: 96.2 fL (ref 78.0–100.0)
Platelets: 191 10*3/uL (ref 150–400)
RBC: 2.66 MIL/uL — ABNORMAL LOW (ref 3.87–5.11)
RDW: 15.7 % — ABNORMAL HIGH (ref 11.5–15.5)
WBC: 6.2 10*3/uL (ref 4.0–10.5)

## 2017-11-05 LAB — RAPID URINE DRUG SCREEN, HOSP PERFORMED
Amphetamines: NOT DETECTED
BARBITURATES: NOT DETECTED
Benzodiazepines: NOT DETECTED
Cocaine: NOT DETECTED
Opiates: NOT DETECTED
Tetrahydrocannabinol: NOT DETECTED

## 2017-11-05 LAB — ACTH STIMULATION, 3 TIME POINTS
Cortisol, 30 Min: 32.3 ug/dL
Cortisol, 60 Min: 39.5 ug/dL
Cortisol, Base: 19.2 ug/dL

## 2017-11-05 LAB — TSH: TSH: 54.17 u[IU]/mL — ABNORMAL HIGH (ref 0.350–4.500)

## 2017-11-05 MED ORDER — SODIUM CHLORIDE 0.9 % IV SOLN
8.0000 mg | Freq: Four times a day (QID) | INTRAVENOUS | Status: DC
  Administered 2017-11-05 – 2017-11-09 (×8): 8 mg via INTRAVENOUS
  Filled 2017-11-05 (×17): qty 4

## 2017-11-05 MED ORDER — ONDANSETRON HCL 4 MG PO TABS
8.0000 mg | ORAL_TABLET | Freq: Four times a day (QID) | ORAL | Status: DC
  Administered 2017-11-06 – 2017-11-08 (×7): 8 mg via ORAL
  Filled 2017-11-05 (×7): qty 2

## 2017-11-05 MED ORDER — GI COCKTAIL ~~LOC~~
30.0000 mL | Freq: Once | ORAL | Status: AC
  Administered 2017-11-05: 30 mL via ORAL
  Filled 2017-11-05: qty 30

## 2017-11-05 MED ORDER — HYDROMORPHONE HCL 1 MG/ML IJ SOLN
0.2500 mg | Freq: Four times a day (QID) | INTRAMUSCULAR | Status: DC | PRN
  Administered 2017-11-05: 0.25 mg via INTRAVENOUS
  Filled 2017-11-05: qty 1

## 2017-11-05 MED ORDER — COSYNTROPIN 0.25 MG IJ SOLR: 0.2500 mg | Freq: Once | INTRAMUSCULAR | Status: DC

## 2017-11-05 MED ORDER — BOOST / RESOURCE BREEZE PO LIQD CUSTOM
1.0000 | Freq: Three times a day (TID) | ORAL | Status: DC
  Administered 2017-11-06: 1 via ORAL

## 2017-11-05 MED ORDER — METOCLOPRAMIDE HCL 5 MG/ML IJ SOLN
10.0000 mg | Freq: Three times a day (TID) | INTRAMUSCULAR | Status: DC
  Administered 2017-11-05 – 2017-11-09 (×11): 10 mg via INTRAVENOUS
  Filled 2017-11-05 (×11): qty 2

## 2017-11-05 MED ORDER — ADULT MULTIVITAMIN W/MINERALS CH
1.0000 | ORAL_TABLET | Freq: Every day | ORAL | Status: DC
  Administered 2017-11-06 – 2017-11-08 (×3): 1 via ORAL
  Filled 2017-11-05 (×3): qty 1

## 2017-11-05 MED ORDER — COSYNTROPIN 0.25 MG IJ SOLR
0.2500 mg | Freq: Once | INTRAMUSCULAR | Status: AC
  Administered 2017-11-05: 0.25 mg via INTRAVENOUS
  Filled 2017-11-05: qty 0.25

## 2017-11-05 MED ORDER — SODIUM CHLORIDE 0.9% FLUSH
10.0000 mL | INTRAVENOUS | Status: DC | PRN
  Administered 2017-11-09: 10 mL
  Filled 2017-11-05: qty 40

## 2017-11-05 MED ORDER — SODIUM CHLORIDE 0.9 % IV SOLN
INTRAVENOUS | Status: DC
  Administered 2017-11-05 – 2017-11-06 (×2): via INTRAVENOUS

## 2017-11-05 NOTE — Progress Notes (Signed)
Subjective: Ms. Burstein reports continued nausea and abdominal pain. She states that at home the only thing that helps the pain is taking a bath in scalding hot water, however heating pads are unhelpful. She has not yet been able to eat, but she believes she will feel like eating after getting dilaudid for the pain. She has dealt with these symptoms for 8 years, but today's pain is more severe than her normal abdominal pain. She has no other concerns at this time.  Objective:  Vital signs in last 24 hours: Vitals:   11/04/17 1702 11/04/17 1915 11/04/17 2227 11/05/17 0432  BP:  132/83 125/85 124/85  Pulse:  80 73 78  Resp:   16 20  Temp:   100.2 F (37.9 C) 99.3 F (37.4 C)  TempSrc:   Oral Oral  SpO2:  100% 100% 100%  Weight: 71.2 kg     Height: 5\' 1"  (1.549 m)      Physical Exam  Constitutional: She is oriented to person, place, and time. She appears well-developed and well-nourished. No distress.  HENT:  Head: Normocephalic and atraumatic.  Cardiovascular: Normal rate and regular rhythm. Exam reveals no gallop and no friction rub.  No murmur heard. Pulmonary/Chest: Effort normal and breath sounds normal. She has no wheezes. She has no rhonchi. She has no rales.  Abdominal:  Abdomen is soft and non-distended. TTP on the LUQ and LLQ.  Neurological: She is alert and oriented to person, place, and time.  Skin: Skin is warm and dry.    Assessment/Plan:  Principal Problem:   Abdominal pain Active Problems:   Hypothyroidism   AKI (acute kidney injury) ([place])   Transaminitis   Respiratory alkalosis  Ms. Glendening is a 52 yo female with a medical history of recurrent abdominal pain, gastroparesis, diverticulitis, chronic back pain, rheumatoid arthritis, hypothyroidism, and CKD presenting with nausea and vomiting in the setting of a gastroparesis flare.  Gastroparesis exacerbation - Pt continues to have nausea and abdominal pain that is more severe in nature than her normal  symptoms. We will provide supportive care while she recovers from this exacerbation.  - She reports dilaudid is the only thing that truly helps her pain and allows her to tolerate food. She was hospitalized recently at [facility] and discharged 9/21 for the same problem, where she was not given opioids for pain. She visited the [facility] ED on 9/23, but left AMA when she was not given dilaudid for pain. She presented to the [facility] ED hours later. Will avoid IV opioids because they could further worsen her gastroparesis and patient's history raises questions about drug seeking behaviors. - Her clinical presentation raises suspicion for adrenal insufficiency, especially given her prior history of chronic prednisone use for RA. However, ACTH stimulation test showed appropriate increase in cortisol, ruling out adrenal insufficiency.  - Her symptoms may also be related to hyperthyroidism (TSH on admission was 54). She has a history of noncompliance on her synthroid. Plan  - Tylenol for pain - Zofran 4mg  q6hrs PRN for nausea - Continue home gabapentin 600 mg BID - Bowel regimen with miralax and senokot  - Advance diet as she tolerates  - D/C IV fluids. If by this evening, she still has not been able to tolerate PO, consider starting LR at 100cc/hr - Continue synthroid 150 mcg daily - OT/PT  AKI - Cr elevated to 1.46 on admission. 1.09 today. AKI has improved. Most likely prerenal due to poor PO intake. Plan - Trend Cr -  Restart fluids if patient cannot tolerate PO as above  Transaminitis - AST and ALT have improved since admission, but still exhibit a 2:1 AST:ALT pattern Plan - AM CMP  Dispo: Anticipated discharge in approximately 1-2 days.  Dorrell, Andree Elk, MD 11/05/2017, 6:54 AM Pager: [phone]

## 2017-11-05 NOTE — Progress Notes (Signed)
Initial Nutrition Assessment  DOCUMENTATION CODES:   Not applicable  INTERVENTION:   Boost Breeze po TID, each supplement provides 250 kcal and 9 grams of protein  MVI with minerals daily   NUTRITION DIAGNOSIS:   Inadequate oral intake related to altered GI function as evidenced by per patient/family report, energy intake < 75% for > or equal to 3 months.  GOAL:   Patient will meet greater than or equal to 90% of their needs  MONITOR:   PO intake, Diet advancement, Supplement acceptance, Weight trends, I & O's, Labs  REASON FOR ASSESSMENT:   Malnutrition Screening Tool   ASSESSMENT:   Mrs. Antonucci is a 52 yo female with PMH of hypothyroidism, gastroporesis, degenerative disc disease, rheumatoid arthritis, lupus, anemia, constipation, depression, acute kidney injury and fibromyalgia who is admitted for abdominal pain and respiratory alkalosis.   Visited pt at bedside. She reports being very nauseous and had requested nausea meds from RN; spoke with RN who is waiting on order from MD. Pt repeatedly states fear of throwing up again. When asked about her diet she reports eating one meal a day, usually at night; last meal eaten was pepper steak and green beans. She says she feels better during the day when she has not eaten, and when eating sticks to bland foods. She describes pain as food "sitting in stomach like a rock" and says it is worse with foods which are high in fat, fried, spicy. She reports digestion is slow and results in N/V. This has been ongoing for a year. She does not take any vitamin supplements at home. Bowel movements more constipation than diarrhea; she reports historically going every other day. Per chart, her CT scan this admit showed stool burden. When asked about her fluid consumption she reports eating ice more than drinking water (suspect pica, pt has hx of anemia). She admits to being noncompliant with Synthroid med, states that she often forgets to take it in  AM. Stressed to pt importance of compliance with Synthroid as decreased thyroid function could be a contributing factor to her GI condition. Per chart review, Mrs. Groome has been seen for gastroparesis before and has had a TSH as high as 124 in the past. She also has hx of gallbladder removal and high narcotic use.   She reports her previous usual weight 260 lbs has decreased to 150 lbs (unsure of this time frame). Per chart review, weight has been stable for a year at around 160 lbs. Bed weight was 71.5 kg today (157 lbs). Physical exam showed only mild muscle depletions in lower extremities. This pt is at risk for malnutrition but does not meet criteria at this time. Recommend Boost Breeze TID as clear liquid supplement will be better tolerated.   Medications reviewed and include: Synthroid 409 mcg, folic acid 1 mg, Miralax, Senokot, vitamin B-12 1000 mcg, Lovenox  Labs reviewed: Sodium 133, alkaline phosphatase 162, AST 74, ALT 48, GFR 56, Hgb 8.4, TSH 54.17 (79.0 in 4/19), ketones: 20  NUTRITION - FOCUSED PHYSICAL EXAM:    Most Recent Value  Orbital Region  No depletion  Upper Arm Region  No depletion  Thoracic and Lumbar Region  No depletion  Buccal Region  No depletion  Temple Region  No depletion  Clavicle Bone Region  No depletion  Clavicle and Acromion Bone Region  No depletion  Scapular Bone Region  No depletion  Dorsal Hand  No depletion  Patellar Region  Mild depletion  Anterior Thigh Region  Mild depletion  Posterior Calf Region  Mild depletion  Edema (RD Assessment)  None  Hair  Reviewed  Eyes  Reviewed  Mouth  Reviewed  Skin  Reviewed [ecchymosis on arms,  dry flaky skin]  Nails  Reviewed       Diet Order:   Diet Order            Diet full liquid Room service appropriate? Yes; Fluid consistency: Thin  Diet effective now              EDUCATION NEEDS:   Education needs have been addressed  Skin:  Skin Assessment: Reviewed RN Assessment(Ecchymosis on arms;  dry flaky skin)  Last BM:   9/23  Height:   Ht Readings from Last 1 Encounters:  11/04/17 5\' 1"  (1.549 m)    Weight:   Wt Readings from Last 1 Encounters:  11/05/17 71.5 kg    Ideal Body Weight:  47.7 kg  BMI:  Body mass index is 29.78 kg/m.  Estimated Nutritional Needs:   Kcal:  1300-1500  Protein:  65-75  Fluid:  >1.5 L   Keymon Mcelroy, Dietetic Intern [phone]

## 2017-11-05 NOTE — Progress Notes (Signed)
Pt has complained of nausea entire shift. She stated that she vomited once in basin. Only thing in basin was a small amount of saliva. She has called me q15-30 minutes requesting additional pain and nausea med.. Doctors have been notified many times throughout the shift. Will continue to monitor.

## 2017-11-05 NOTE — Progress Notes (Signed)
VAST RN notified Adrienne Doffing, MD of need for lab form (Acceptance Form for Irreplaceable Specimens) to be signed ASAP and sent to lab in order for baseline cortisol level to be calculated.  VAST RN to pt's bedside this am to draw baseline cortisol level prior to administration of Cortisol medication. This RN called lab to determine how to send lab specimen since labels would not print from Adrienne Flores for Adrienne Flores stimulation, 3 time points (Order [id number]) in place. This RN asked if temporary label from [employer] could be used and was advised "yes, as long as the following was also written on the label: Baseline cortisol level, time and date collected".  VAST RN returned to pt's room at 0945 to draw 30 minute cortisol level after medication was administered at 0910. Adrienne Flores, unit RN notified this RN that lab would not accept specimen bc it was labeled incorrectly. VAST RN called lab and spoke with Adrienne Flores regarding instructions given previously. Adrienne Flores stated she was unaware that [employer] would print such a label, but that it could not be used bc the billing number was listed and it does not remain constant such as the MRN. Adrienne Flores advised VAST RN that specimen had to be labeled with pt's name, MRN, and DOB using pt label if [employer] label unavailable. VAST RN requested to come to lab and re-label specimen as specimen was pre-drug level and cannot be redrawn. Adrienne Flores instructed Acceptance Form for Irreplaceable Specimens would have to be filled out and then signed by a physician. VAST RN obtained paperwork from lab and attempted to find physician or resident on unit to sign. After an unsuccessful search, unit RN suggested leaving form with pt's chart outside room and contacting physician to come sign it when able.  VAST RN notified lab that attempt to get form signed was ongoing.

## 2017-11-05 NOTE — Progress Notes (Signed)
Pt c/o abdominal pain and nausea. On call MD notified with new order: GI cocktail x1.

## 2017-11-05 NOTE — Plan of Care (Signed)
  Problem: Safety: Goal: Ability to remain free from injury will improve Outcome: Progressing   Problem: Skin Integrity: Goal: Risk for impaired skin integrity will decrease Outcome: Progressing   Problem: Pain Managment: Goal: General experience of comfort will improve Outcome: Not Progressing

## 2017-11-05 NOTE — Evaluation (Signed)
Physical Therapy Evaluation Patient Details Name: Adrienne Flores MRN: [id number] DOB: 1965-04-10 Today's Date: 11/05/2017   History of Present Illness  Adrienne Flores is a 52 yo female with PMH recurrent abdominal pain, gastroparesis, diverticulitis, chronic back pain, rheumatoid arthritis, hypothyroidism, and AKI presenting with nausea and vomiting that began this morning.   Clinical Impression  Pt admitted with above diagnosis. Pt currently with functional limitations due to the deficits listed below (see PT Problem List). Pt limited by pain and nausea today, ambulated 70' with RW and min-guard A. Pt has been using stad walker at home and appreciated increased stability and back pain relief of RW, recommend for home. PT will follow acutely but expect that once her pain decreases she will progress well.  Pt will benefit from skilled PT to increase their independence and safety with mobility to allow discharge to the venue listed below.       Follow Up Recommendations No PT follow up    Equipment Recommendations  Rolling walker with 5" wheels    Recommendations for Other Services       Precautions / Restrictions Precautions Precautions: None Restrictions Weight Bearing Restrictions: No      Mobility  Bed Mobility Overal bed mobility: Needs Assistance Bed Mobility: Supine to Sit;Sit to Supine     Supine to sit: Supervision Sit to supine: Supervision   General bed mobility comments: slow, guarded movement  Transfers Overall transfer level: Needs assistance Equipment used: Rolling walker (2 wheeled) Transfers: Sit to/from Stand Sit to Stand: Min guard         General transfer comment: min-guard for safety as pt felt unsteady due to abdominal pain and nausea  Ambulation/Gait Ambulation/Gait assistance: Min guard Gait Distance (Feet): 60 Feet Assistive device: Rolling walker (2 wheeled) Gait Pattern/deviations: Step-through pattern;Decreased stride length Gait  velocity: decreased Gait velocity interpretation: <1.8 ft/sec, indicate of risk for recurrent falls General Gait Details: pt has been using std walker at home and felt that RW helped her back and abdominal pain much more as well as being safer from balance standpoint  Stairs            Wheelchair Mobility    Modified Rankin (Stroke Patients Only)       Balance Overall balance assessment: Mild deficits observed, not formally tested                                           Pertinent Vitals/Pain Pain Assessment: Faces Faces Pain Scale: Hurts even more(and 8/10 nausea) Pain Location: abdomen Pain Descriptors / Indicators: Discomfort Pain Intervention(s): Limited activity within patient's tolerance;Monitored during session    Home Living Family/patient expects to be discharged to:: Private residence Living Arrangements: Spouse/significant other Available Help at Discharge: Family;Available 24 hours/day Type of Home: House Home Access: Stairs to enter Entrance Stairs-Rails: Psychiatric nurse of Steps: 3 Home Layout: One level Home Equipment: Walker - standard;Bedside commode;Shower seat;Cane - single point      Prior Function Level of Independence: Needs assistance   Gait / Transfers Assistance Needed: walks with std walker as needed for her back pain  ADL's / Homemaking Assistance Needed: husband helps if needed        Hand Dominance   Dominant Hand: Right    Extremity/Trunk Assessment   Upper Extremity Assessment Upper Extremity Assessment: Overall WFL for tasks assessed    Lower Extremity Assessment  Lower Extremity Assessment: Overall WFL for tasks assessed    Cervical / Trunk Assessment Cervical / Trunk Assessment: Normal  Communication   Communication: No difficulties  Cognition Arousal/Alertness: Awake/alert Behavior During Therapy: WFL for tasks assessed/performed Overall Cognitive Status: Within Functional  Limits for tasks assessed                                        General Comments      Exercises     Assessment/Plan    PT Assessment Patient needs continued PT services  PT Problem List Decreased activity tolerance;Decreased balance;Decreased mobility;Decreased knowledge of use of DME;Pain       PT Treatment Interventions DME instruction;Gait training;Stair training;Functional mobility training;Therapeutic activities;Therapeutic exercise;Patient/family education;Balance training    PT Goals (Current goals can be found in the Care Plan section)  Acute Rehab PT Goals Patient Stated Goal: decrease abdominal pain and return home PT Goal Formulation: With patient Time For Goal Achievement: 11/19/17 Potential to Achieve Goals: Good    Frequency Min 3X/week   Barriers to discharge        Co-evaluation               AM-PAC PT "6 Clicks" Daily Activity  Outcome Measure Difficulty turning over in bed (including adjusting bedclothes, sheets and blankets)?: A Little Difficulty moving from lying on back to sitting on the side of the bed? : A Little Difficulty sitting down on and standing up from a chair with arms (e.g., wheelchair, bedside commode, etc,.)?: A Little Help needed moving to and from a bed to chair (including a wheelchair)?: A Little Help needed walking in hospital room?: A Little Help needed climbing 3-5 steps with a railing? : A Little 6 Click Score: 18    End of Session   Activity Tolerance: Patient limited by pain;Other (comment)(nausea) Patient left: in bed;with call bell/phone within reach Nurse Communication: Mobility status PT Visit Diagnosis: Unsteadiness on feet (R26.81);Pain;Difficulty in walking, not elsewhere classified (R26.2) Pain - part of body: (abdomen)    Time: 0388-8280 PT Time Calculation (min) (ACUTE ONLY): 21 min   Charges:   PT Evaluation $PT Eval Moderate Complexity: [place]  Pager [phone] Office [place] 11/05/2017, 3:43 PM

## 2017-11-06 LAB — BASIC METABOLIC PANEL
ANION GAP: 9 (ref 5–15)
BUN: <5 mg/dL — ABNORMAL LOW (ref 6–20)
CALCIUM: 8.4 mg/dL — AB (ref 8.9–10.3)
CHLORIDE: 100 mmol/L (ref 98–111)
CO2: 21 mmol/L — AB (ref 22–32)
Creatinine, Ser: 1 mg/dL (ref 0.44–1.00)
GFR calc non Af Amer: >60 mL/min (ref >60)
GLUCOSE: 97 mg/dL (ref 70–99)
POTASSIUM: 3.1 mmol/L — AB (ref 3.5–5.1)
Sodium: 130 mmol/L — ABNORMAL LOW (ref 135–145)

## 2017-11-06 LAB — COMPREHENSIVE METABOLIC PANEL
ALBUMIN: 3.7 g/dL (ref 3.5–5.0)
ALT: 46 U/L — ABNORMAL HIGH (ref 0–44)
AST: 69 U/L — AB (ref 15–41)
Alkaline Phosphatase: 176 U/L — ABNORMAL HIGH (ref 38–126)
Anion gap: 12 (ref 5–15)
CHLORIDE: 99 mmol/L (ref 98–111)
CO2: 19 mmol/L — AB (ref 22–32)
CREATININE: 1.01 mg/dL — AB (ref 0.44–1.00)
Calcium: 8.2 mg/dL — ABNORMAL LOW (ref 8.9–10.3)
GFR calc Af Amer: >60 mL/min (ref >60)
GLUCOSE: 93 mg/dL (ref 70–99)
POTASSIUM: 3.1 mmol/L — AB (ref 3.5–5.1)
Sodium: 130 mmol/L — ABNORMAL LOW (ref 135–145)
Total Bilirubin: 1 mg/dL (ref 0.3–1.2)
Total Protein: 6.8 g/dL (ref 6.5–8.1)

## 2017-11-06 LAB — T4, FREE: Free T4: 0.43 ng/dL — ABNORMAL LOW (ref 0.82–1.77)

## 2017-11-06 LAB — OSMOLALITY, URINE: Osmolality, Ur: 283 mOsm/kg — ABNORMAL LOW (ref 300–900)

## 2017-11-06 LAB — SODIUM, URINE, RANDOM: Sodium, Ur: 101 mmol/L

## 2017-11-06 LAB — MAGNESIUM: MAGNESIUM: 2.2 mg/dL (ref 1.7–2.4)

## 2017-11-06 MED ORDER — GI COCKTAIL ~~LOC~~
30.0000 mL | Freq: Once | ORAL | Status: AC
  Administered 2017-11-06: 30 mL via ORAL
  Filled 2017-11-06: qty 30

## 2017-11-06 MED ORDER — DICYCLOMINE HCL 20 MG PO TABS
20.0000 mg | ORAL_TABLET | Freq: Three times a day (TID) | ORAL | Status: DC | PRN
  Administered 2017-11-06 – 2017-11-09 (×3): 20 mg via ORAL
  Filled 2017-11-06 (×3): qty 1

## 2017-11-06 MED ORDER — POTASSIUM CHLORIDE 2 MEQ/ML IV SOLN
INTRAVENOUS | Status: AC
  Administered 2017-11-06: 17:00:00 via INTRAVENOUS
  Filled 2017-11-06: qty 1000

## 2017-11-06 MED ORDER — CAPSAICIN 0.025 % EX CREA
TOPICAL_CREAM | Freq: Two times a day (BID) | CUTANEOUS | Status: DC | PRN
  Administered 2017-11-09: 05:00:00 via TOPICAL
  Filled 2017-11-06: qty 60

## 2017-11-06 MED ORDER — POTASSIUM CHLORIDE 10 MEQ/50ML IV SOLN
10.0000 meq | INTRAVENOUS | Status: DC
  Filled 2017-11-06 (×5): qty 50

## 2017-11-06 MED ORDER — POTASSIUM CHLORIDE CRYS ER 20 MEQ PO TBCR
40.0000 meq | EXTENDED_RELEASE_TABLET | Freq: Two times a day (BID) | ORAL | Status: AC
  Administered 2017-11-06 – 2017-11-07 (×2): 40 meq via ORAL
  Filled 2017-11-06 (×2): qty 2

## 2017-11-06 NOTE — Progress Notes (Addendum)
OT Cancellation Note  Patient Details Name: Adrienne Flores MRN: [id number] DOB: [birth year]   Cancelled Treatment:    Reason Eval/Treat Not Completed: Other (comment); pt sleeping upon entering room, spouse present and reports she has not been sleeping well recently, request OT to check back at a later time. Will follow up as schedule permits.  Checked back this PM, pt sleeping soundly, spouse reports pt was awake and recently took meds for nausea/pain; spouse politely request not to wake pt at this time. Will follow up as able.    Lou Cal, OT [facility] Pager [phone] Office [phone]   Raymondo Band 11/06/2017, 12:32 PM

## 2017-11-06 NOTE — Progress Notes (Signed)
Pt states she was awakened by abdominal pain. Also c/o nausea, scheduled Zofran IV given. Notified on call MD and received new order:GI cocktail x1.

## 2017-11-06 NOTE — Progress Notes (Signed)
   Subjective: Adrienne Flores continued to have abdominal pain and nausea throughout the night. She received a GI cocktail x2 without any improvement. She reports that she has tried Reglan and capsaicin cream before with no effect on her pain. She states that the only thing that helps is Dilaudid and that she cannot eat without it. The medical team explained to the patient that opioids are not indicated for gastroparesis, and thus she will not be receiving them during this hospitalization. Today her pain is located more on the right side than the left side as it was yesterday. She reports she has had normal BMs since admission.   Objective:  Vital signs in last 24 hours: Vitals:   11/05/17 1717 11/05/17 1800 11/05/17 2056 11/06/17 0506  BP: (!) 156/102 (!) 146/91 129/75 (!) 131/97  Pulse: 76  72 90  Resp: 20  16 20   Temp: 97.7 F (36.5 C)  98.3 F (36.8 C) 98.7 F (37.1 C)  TempSrc: Oral  Oral Oral  SpO2: 100%  100% 100%  Weight:      Height:       Constitutional: Laying comfortably in bed without distress. Abdominal: Abdomen is soft and non-distended. TTP on the RUQ AND RLQ. Neurological: She is alert and oriented x3. Skin: Skin is warm and dry.   Assessment/Plan:  Principal Problem:   Gastroparesis Active Problems:   Hypothyroidism   AKI (acute kidney injury) ([place])   Transaminitis   Respiratory alkalosis  Adrienne Flores is a 52 yo female with a medical history ofrecurrent abdominal pain,gastroparesis, diverticulitis, chronic back pain, rheumatoid arthritis, hypothyroidism, andCKDpresenting with nausea and vomiting in the setting of a gastroparesis flare.  Gastroparesis exacerbation - Pt continues to have nausea and abdominal pain that is more severe in nature than her normal symptoms. We will provide supportive care while she recovers from this exacerbation with IV fluids, anti-emetics, Reglan, PRN bentyl, and PRN topical capsaicin. Opioids are not indicated for gastroparesis  and the patient has exhibited drug seeking behaviors by seeking care at multiple hospitals in the area and leaving [facility] when not given Dilaudid. Will continue to avoid opioids for her pain.  - Her hyponatremia has worsened from 133 to 130 and her potassium has decreased from 3.9 to 3.1. This is likely in the setting of dehydration in the setting of receiving hypotonic fluids yesterday. Will continue to hydrate with balanced IVF given that the patient is still unable to eat.  - Her symptoms may also be related to hyperthyroidism (TSH on admission was 54). She has a history of noncompliance on her synthroid. Will get T4 today.  Plan  - LR with 13meq K at 100cc/hr - Tylenol PRN for pain - Zofran 4mg  q6hrs PRN for nausea - Continue home gabapentin 600 mg BID - Reglan 10 mg IV TID - Bentyl 20 mg TID PRN for pain - Capsaicin cream BID PRN for pain - Bowel regimen with miralax and senokot  - Advance diet as she tolerates  - Continue synthroid 150 mcg daily - OT/PT - T4 - Urine Na - Urine Osm  AKI - Cr 1.01 today. As she is still unable to tolerate PO, will continue IVF. Plan - Trend Cr - IVF as above  Dispo: Anticipated discharge in approximately 1-2 day(s).   Tanaia Hawkey, Andree Elk, MD 11/06/2017, 6:57 AM Pager: [phone]

## 2017-11-06 NOTE — Progress Notes (Signed)
Internal Medicine Attending:   I saw and examined the patient. I reviewed the resident's note and I agree with the resident's findings and plan as documented in the resident's note.  Principal Problem:   Gastroparesis Active Problems:   Hypothyroidism   AKI (acute kidney injury) ([place])   Transaminitis   Respiratory alkalosis  Hospital day #2 with flare of abdominal pain due to IBS and gastroparesis. We gave the patient our medical opinion that opioids are not appropriate for the management of this kind of chronic abdominal pain. The patient disagrees with [place] and believes that dilaudid is the only medicine that will give her relief, she wants to be able to "go to sleep." Still showing signs of dehydration today, I agree with restarting IV fluids. I agree with managing abdominal pain with Reglan, Bentyl, Zofran, and Capsaicin. This is consistent with treatments she has received at other hospitals. I am hopeful we can make some symptomatic progress by being patient and consistent. We also encouraged her to better establish with a single gastrologist provider, in addition to her pain clinic provider, to be able to better manage her chronic symptom burden.  Lalla Brothers, MD

## 2017-11-06 NOTE — Progress Notes (Signed)
Pt still c/o terrible abdominal pain. GI cocktail did not help at all. On call MD made aware. No orders received but they will see her shortly.

## 2017-11-07 DIAGNOSIS — Z79899 Other long term (current) drug therapy: Secondary | ICD-10-CM

## 2017-11-07 DIAGNOSIS — Z79891 Long term (current) use of opiate analgesic: Secondary | ICD-10-CM

## 2017-11-07 DIAGNOSIS — E871 Hypo-osmolality and hyponatremia: Secondary | ICD-10-CM

## 2017-11-07 LAB — COMPREHENSIVE METABOLIC PANEL
ALK PHOS: 174 U/L — AB (ref 38–126)
ALT: 42 U/L (ref 0–44)
ANION GAP: 8 (ref 5–15)
AST: 51 U/L — ABNORMAL HIGH (ref 15–41)
Albumin: 3.7 g/dL (ref 3.5–5.0)
BUN: <5 mg/dL — ABNORMAL LOW (ref 6–20)
CALCIUM: 8.5 mg/dL — AB (ref 8.9–10.3)
CO2: 23 mmol/L (ref 22–32)
Chloride: 101 mmol/L (ref 98–111)
Creatinine, Ser: 1.02 mg/dL — ABNORMAL HIGH (ref 0.44–1.00)
GFR calc non Af Amer: >60 mL/min (ref >60)
Glucose, Bld: 89 mg/dL (ref 70–99)
POTASSIUM: 3.3 mmol/L — AB (ref 3.5–5.1)
SODIUM: 132 mmol/L — AB (ref 135–145)
Total Bilirubin: 0.9 mg/dL (ref 0.3–1.2)
Total Protein: 6.8 g/dL (ref 6.5–8.1)

## 2017-11-07 MED ORDER — SODIUM CHLORIDE 0.9 % IV SOLN
INTRAVENOUS | Status: DC | PRN
  Administered 2017-11-07: 250 mL via INTRAVENOUS

## 2017-11-07 MED ORDER — TRAMADOL HCL 50 MG PO TABS
50.0000 mg | ORAL_TABLET | Freq: Three times a day (TID) | ORAL | Status: DC | PRN
  Administered 2017-11-07 – 2017-11-09 (×6): 50 mg via ORAL
  Filled 2017-11-07 (×6): qty 1

## 2017-11-07 MED ORDER — TRAMADOL HCL 50 MG PO TABS: 50.0000 mg | ORAL_TABLET | Freq: Three times a day (TID) | ORAL | Status: DC

## 2017-11-07 MED ORDER — KCL-LACTATED RINGERS 20 MEQ/L IV SOLN
INTRAVENOUS | Status: AC
  Administered 2017-11-07: 08:00:00 via INTRAVENOUS
  Filled 2017-11-07 (×3): qty 1000

## 2017-11-07 NOTE — Progress Notes (Signed)
Internal Medicine Attending:   I saw and examined the patient. I reviewed the resident's note and I agree with the resident's findings and plan as documented in the resident's note.  Principal Problem:   Gastroparesis Active Problems:   Hypothyroidism   AKI (acute kidney injury) ([place])   Transaminitis   Respiratory alkalosis  Hospital day #3 with dehydration due to gastroparesis and IBS. She is clinically stable today. Electrolyte abnormalities improved with IV fluid hydration, I would continue LR today for the hyponatremia and dehydration. Still has very low oral intake per her report. Sensation of pain is still limiting her intake and functional status. We will resume home tramadol today now that she is tolerating oral pills better. Continue with reglan, bentyl, zofran, and capsaicin cream as well.   Lalla Brothers, MD

## 2017-11-07 NOTE — Plan of Care (Signed)
  Problem: Clinical Measurements: Goal: Ability to maintain clinical measurements within normal limits will improve Outcome: Progressing Goal: Will remain free from infection Outcome: Progressing   Problem: Elimination: Goal: Will not experience complications related to bowel motility Outcome: Progressing Goal: Will not experience complications related to urinary retention Outcome: Progressing   Problem: Pain Managment: Goal: General experience of comfort will improve Outcome: Progressing   Problem: Safety: Goal: Ability to remain free from injury will improve Outcome: Progressing   Problem: Skin Integrity: Goal: Risk for impaired skin integrity will decrease Outcome: Progressing  Forestine Chute, RN 11/07/2017 12:39 AM

## 2017-11-07 NOTE — Evaluation (Signed)
Occupational Therapy Evaluation Patient Details Name: BRANTLEY NASER MRN: [id number] DOB: 1966-01-31 Today's Date: 11/07/2017    History of Present Illness Mrs. Mian is a 52 yo female with PMH recurrent abdominal pain, gastroparesis, diverticulitis, chronic back pain, rheumatoid arthritis, hypothyroidism, and AKI presenting with nausea and vomiting that began this morning.    Clinical Impression   This 52 y/o female presents with the above. At baseline pt reports she intermittently uses DME for functional mobility, reports overall completes ADLs without assist. Pt completing room level mobility this session without AD, requiring consistent hands on assist (minA) as pt is unsteady without use of UE support at this time. Pt currently requires minA for standing grooming and LB ADLs, setup assist for seated UB ADLs. Pt reports she lives at home with spouse who is able to assist with ADLs PRN. Pt will benefit from continued OT services while in acute setting to maximize her overall strength, safety and independence with ADLs and mobility prior to return home. Will follow.     Follow Up Recommendations  No OT follow up;Supervision/Assistance - 24 hour(24hr initially)    Equipment Recommendations  None recommended by OT           Precautions / Restrictions Precautions Precautions: None Restrictions Weight Bearing Restrictions: No      Mobility Bed Mobility               General bed mobility comments: pt OOB in recliner upon arrival  Transfers Overall transfer level: Needs assistance Equipment used: None Transfers: Sit to/from Stand Sit to Stand: Min guard;Min assist         General transfer comment: minguard initially for safety for initial sit<>stand, though overall requires minA for standing balance without UE support    Balance Overall balance assessment: Needs assistance Sitting-balance support: Feet supported;No upper extremity supported Sitting balance-Leahy  Scale: Good     Standing balance support: During functional activity;No upper extremity supported Standing balance-Leahy Scale: Fair Standing balance comment: can maintain static standing with close minguard assist; though overall requiring minA for dynamic balance without UE support                           ADL either performed or assessed with clinical judgement   ADL Overall ADL's : Needs assistance/impaired Eating/Feeding: Independent;Sitting   Grooming: Wash/dry hands;Minimal assistance;Standing Grooming Details (indicate cue type and reason): minA standing balance Upper Body Bathing: Min guard;Sitting   Lower Body Bathing: Minimal assistance;Sit to/from stand   Upper Body Dressing : Min guard;Sitting   Lower Body Dressing: Minimal assistance;Sit to/from stand Lower Body Dressing Details (indicate cue type and reason): pt able to reach LEs to adjust socks; requires minA for standing balance Toilet Transfer: Minimal assistance;Ambulation;Regular Toilet;Grab bars Toilet Transfer Details (indicate cue type and reason): simulated in transfer to/from recliner Toileting- Clothing Manipulation and Hygiene: Minimal assistance;Sit to/from stand       Functional mobility during ADLs: Minimal assistance General ADL Comments: pt ambulating in room without AD this session, though unsteady without UE support, requiring consistent minA for balance                         Pertinent Vitals/Pain Pain Assessment: Faces Faces Pain Scale: Hurts a little bit Pain Location: abdomen Pain Descriptors / Indicators: Discomfort Pain Intervention(s): Monitored during session     Hand Dominance Right   Extremity/Trunk Assessment Upper Extremity Assessment Upper Extremity Assessment:  Overall [facility] for tasks assessed   Lower Extremity Assessment Lower Extremity Assessment: Defer to PT evaluation   Cervical / Trunk Assessment Cervical / Trunk Assessment: Normal    Communication Communication Communication: No difficulties   Cognition Arousal/Alertness: Awake/alert Behavior During Therapy: WFL for tasks assessed/performed Overall Cognitive Status: Within Functional Limits for tasks assessed                                     General Comments       Exercises     Shoulder Instructions      Home Living Family/patient expects to be discharged to:: Private residence Living Arrangements: Spouse/significant other Available Help at Discharge: Family;Available 24 hours/day Type of Home: House Home Access: Stairs to enter [employer] of Steps: 3 Entrance Stairs-Rails: Right;Left Home Layout: One level     Bathroom Shower/Tub: Tub/shower unit;Walk-in shower   Bathroom Toilet: Standard     Home Equipment: Walker - standard;Bedside commode;Shower seat;Cane - single point          Prior Functioning/Environment Level of Independence: Needs assistance  Gait / Transfers Assistance Needed: walks with std walker as needed for her back pain ADL's / Homemaking Assistance Needed: husband helps if needed; overall reports independence            OT Problem List: Decreased strength;Decreased activity tolerance;Impaired balance (sitting and/or standing)      OT Treatment/Interventions: Self-care/ADL training;Therapeutic exercise;Therapeutic activities;Patient/family education;Balance training    OT Goals(Current goals can be found in the care plan section) Acute Rehab OT Goals Patient Stated Goal: decrease abdominal pain and return home OT Goal Formulation: With patient Time For Goal Achievement: 11/21/17 Potential to Achieve Goals: Good  OT Frequency: Min 2X/week   Barriers to D/C:            Co-evaluation              AM-PAC PT "6 Clicks" Daily Activity     Outcome Measure Help from another person eating meals?: None Help from another person taking care of personal grooming?: A Little Help from  another person toileting, which includes using toliet, bedpan, or urinal?: A Little Help from another person bathing (including washing, rinsing, drying)?: A Little Help from another person to put on and taking off regular upper body clothing?: None Help from another person to put on and taking off regular lower body clothing?: A Little 6 Click Score: 20   End of Session Equipment Utilized During Treatment: Gait belt Nurse Communication: Mobility status  Activity Tolerance: Patient tolerated treatment well Patient left: in chair;with call bell/phone within reach  OT Visit Diagnosis: Unsteadiness on feet (R26.81);Muscle weakness (generalized) (M62.81)                Time: 1308-6578 OT Time Calculation (min): 21 min Charges:  OT General Charges $OT Visit: 1 Visit OT Evaluation $OT Eval Moderate Complexity: [place], OT [employer] Pager [phone] Office [phone]   Raymondo Band 11/07/2017, 1:45 PM

## 2017-11-07 NOTE — Progress Notes (Signed)
   Subjective: No overnight events. Ms. Adrienne Flores reports that her abdominal pain and nausea continue to be severe. She tried Bentyl yesterday with no relief. She has not been able to eat or drink anything yet, but she has been able to take oral medications. She denies vomiting. She reports that she takes tramadol at home for the pain and that sometimes helps. She has no new symptoms or concerns today.  Objective:  Vital signs in last 24 hours: Vitals:   11/06/17 0800 11/06/17 1419 11/06/17 2124 11/07/17 0500  BP: (!) 136/91 (!) 145/98 (!) 145/91 (!) 152/82  Pulse: 90 75 86 81  Resp: 18 16 16 15   Temp: 98.3 F (36.8 C) 98 F (36.7 C) 99 F (37.2 C) 98.3 F (36.8 C)  TempSrc: Oral Oral Oral Oral  SpO2: 100% 100% 100% 100%  Weight:      Height:       Constitutional: Laying comfortably in bed without distress. Abdominal: Abdomen is soft and non-distended. Diffuse TTP. Ext: No lower extremity edema. Neurological: She isalertand oriented x3. Skin: Skin iswarmand dry.  Assessment/Plan:  Principal Problem:   Gastroparesis Active Problems:   Hypothyroidism   AKI (acute kidney injury) ([place])   Transaminitis   Respiratory alkalosis  Ms. Adrienne Flores is a18 yo female witha medical history ofrecurrent abdominal pain,gastroparesis, diverticulitis, chronic back pain, rheumatoid arthritis, hypothyroidism, andCKDpresenting with nausea and vomitingin the setting of a gastroparesis flare.  Gastroparesis exacerbation - Pt continues to have nausea and abdominal pain that is more severe in nature than her normal symptoms. We will provide supportive care while she recovers from this exacerbation with IV fluids, anti-emetics, Reglan, PRN bentyl, and PRN topical capsaicin. Now that she is tolerating PO medications (but not yet food), will restart her home tramadol dose. Will continue to avoid other opioids and IV opioids.  - Her hyponatremia has improved from 130 to 132 and her potassium  improved from 3.1 to 3.3 with IV fluids plus potassium. Urine sodium elevated at 101. Urine osmolarity low-normal at 283. Will continue fluids today.  - T4 low at 0.43 in the setting of poor compliance with synthroid. Will continue with synthroid. Plan  - LR with 17meq K at 125cc/hr for 10 hrs - Resume home tramadol at 50 mg q8hrs for pain - Tylenol PRN for pain - Zofran 4mg  q6hrs PRN for nausea - Continue home gabapentin 600 mg BID - Reglan 10 mg IV TID - Bentyl 20 mg TID PRN for pain - Capsaicin cream BID PRN for pain - Bowel regimen with miralax and senokot  - Advance diet as she tolerates  - Continue synthroid 150 mcg daily - OT/PT  Dispo: Anticipated discharge in approximately 1-2 day(s).   Adrienne Flores, Andree Elk, MD 11/07/2017, 6:43 AM Pager: [phone]

## 2017-11-07 NOTE — Progress Notes (Signed)
Physical Therapy Treatment Patient Details Name: Adrienne Flores MRN: [id number] DOB: [birth year] Today's Date: 11/07/2017    History of Present Illness Adrienne Flores is a 52 yo female with PMH recurrent abdominal pain, gastroparesis, diverticulitis, chronic back pain, rheumatoid arthritis, hypothyroidism, and AKI presenting with nausea and vomiting that began this morning.     PT Comments    Pt tolerated increased ambulation distance this session. Refusing to use RW and pt with notable unsteadiness requiring min to min guard A for mobility. Educated about use of RW at home to improve safety. Will continue to follow acutely to maximize functional mobility independence and safety.    Follow Up Recommendations  No PT follow up(pt refusing follow up PT )     Equipment Recommendations  Rolling walker with 5" wheels    Recommendations for Other Services       Precautions / Restrictions Precautions Precautions: None Restrictions Weight Bearing Restrictions: No    Mobility  Bed Mobility               General bed mobility comments: pt OOB in recliner upon arrival  Transfers Overall transfer level: Needs assistance Equipment used: None Transfers: Sit to/from Stand Sit to Stand: Min guard         General transfer comment: Min guard for steadying assist.   Ambulation/Gait Ambulation/Gait assistance: Min guard;Min assist Gait Distance (Feet): 150 Feet Assistive device: None Gait Pattern/deviations: Step-through pattern;Decreased stride length Gait velocity: decreased   General Gait Details: Slow, waddle type gait. Unsteady and required min to min guard A. Pt refusing to use RW. Educated about need for RW at home to increase safety.    Stairs             Wheelchair Mobility    Modified Rankin (Stroke Patients Only)       Balance Overall balance assessment: Needs assistance Sitting-balance support: Feet supported;No upper extremity supported Sitting  balance-Leahy Scale: Good     Standing balance support: During functional activity;No upper extremity supported Standing balance-Leahy Scale: Fair Standing balance comment: Can maintain static standing without UE support                             Cognition Arousal/Alertness: Awake/alert Behavior During Therapy: WFL for tasks assessed/performed Overall Cognitive Status: Within Functional Limits for tasks assessed                                        Exercises General Exercises - Lower Extremity Ankle Circles/Pumps: AROM;Both;10 reps;Seated Long Arc Quad: AROM;Both;10 reps;Seated    General Comments        Pertinent Vitals/Pain Pain Assessment: Faces Faces Pain Scale: Hurts a little bit Pain Location: abdomen Pain Descriptors / Indicators: Discomfort Pain Intervention(s): Limited activity within patient's tolerance;Monitored during session;Repositioned    Home Living                      Prior Function            PT Goals (current goals can now be found in the care plan section) Acute Rehab PT Goals Patient Stated Goal: decrease abdominal pain and return home PT Goal Formulation: With patient Time For Goal Achievement: 11/19/17 Potential to Achieve Goals: Good Progress towards PT goals: Progressing toward goals    Frequency    Min 3X/week  PT Plan Current plan remains appropriate    Co-evaluation              AM-PAC PT "6 Clicks" Daily Activity  Outcome Measure  Difficulty turning over in bed (including adjusting bedclothes, sheets and blankets)?: A Little Difficulty moving from lying on back to sitting on the side of the bed? : A Little Difficulty sitting down on and standing up from a chair with arms (e.g., wheelchair, bedside commode, etc,.)?: Unable Help needed moving to and from a bed to chair (including a wheelchair)?: A Little Help needed walking in hospital room?: A Little Help needed climbing 3-5  steps with a railing? : A Lot 6 Click Score: 15    End of Session Equipment Utilized During Treatment: Gait belt Activity Tolerance: Patient tolerated treatment well Patient left: in chair;with call bell/phone within reach Nurse Communication: Mobility status PT Visit Diagnosis: Unsteadiness on feet (R26.81);Pain;Difficulty in walking, not elsewhere classified (R26.2) Pain - part of body: (abdomen )     Time: 8588-5027 PT Time Calculation (min) (ACUTE ONLY): 13 min  Charges:  $Gait Training: 8-22 mins                     Leighton Ruff, PT, DPT  Acute Rehabilitation Services  Pager: [phone] Office: [phone]    Rudean Hitt 11/07/2017, 6:35 PM

## 2017-11-07 NOTE — Care Management Important Message (Signed)
Important Message  Patient Details  Name: Adrienne Flores MRN: [id number] Date of Birth: 01/24/1966   Medicare Important Message Given:  Yes    Orbie Pyo 11/07/2017, 2:35 PM

## 2017-11-08 LAB — BASIC METABOLIC PANEL
ANION GAP: 7 (ref 5–15)
BUN: <5 mg/dL — ABNORMAL LOW (ref 6–20)
CALCIUM: 8.8 mg/dL — AB (ref 8.9–10.3)
CO2: 25 mmol/L (ref 22–32)
Chloride: 100 mmol/L (ref 98–111)
Creatinine, Ser: 1.09 mg/dL — ABNORMAL HIGH (ref 0.44–1.00)
GFR, EST NON AFRICAN AMERICAN: 56 mL/min — AB (ref >60)
Glucose, Bld: 94 mg/dL (ref 70–99)
Potassium: 3.4 mmol/L — ABNORMAL LOW (ref 3.5–5.1)
Sodium: 132 mmol/L — ABNORMAL LOW (ref 135–145)

## 2017-11-08 MED ORDER — SODIUM CHLORIDE 0.9 % IV SOLN
INTRAVENOUS | Status: DC
  Administered 2017-11-08 – 2017-11-09 (×3): via INTRAVENOUS

## 2017-11-08 MED ORDER — POTASSIUM CHLORIDE CRYS ER 20 MEQ PO TBCR
40.0000 meq | EXTENDED_RELEASE_TABLET | Freq: Once | ORAL | Status: AC
  Administered 2017-11-08: 40 meq via ORAL
  Filled 2017-11-08: qty 2

## 2017-11-08 NOTE — Progress Notes (Signed)
Internal Medicine Attending:   I saw and examined the patient. I reviewed the resident's note and I agree with the resident's findings and plan as documented in the resident's note.  Principal Problem:   Gastroparesis Active Problems:   Hypothyroidism   AKI (acute kidney injury) ([place])   Transaminitis   Respiratory alkalosis  Making some progress on advancing her diet, but still with signs of dehydration today. Plan is to continue with LR and we will see how her oral intake goes. Eligible for dc home tomorrow if she continues to improve.   Lalla Brothers, MD

## 2017-11-08 NOTE — Discharge Summary (Signed)
Name: Adrienne Flores MRN: [id number] DOB: 1965-07-12 52 y.o. PCP: Adrienne Pilar, MD  Date of Admission: 11/04/2017 12:40 PM Date of Discharge: 11/09/2017 Attending Physician: Axel Filler, *  Discharge Diagnosis: 1. Gastroparesis exacerbation 2. Hypothyroidism 3. AKI 4. Transaminitis   Discharge Medications: Allergies as of 11/09/2017      Reactions   Morphine Hives   Morphine And Related Hives, Itching   ONLY MORPHINE, tolerates Dilaudid, oxycodone, and Norco   Methotrexate Derivatives Other (See Comments)   Headaches   Nabumetone Other (See Comments)   Headaches   Methotrexate Other (See Comments)   Headaches   Haloperidol Nausea Only, Other (See Comments)   Patient stated that she broke out in cold sweats, also      Medication List    STOP taking these medications   tiZANidine 4 MG tablet Commonly known as:  ZANAFLEX     TAKE these medications   dicyclomine 20 MG tablet Commonly known as:  BENTYL Take 1 tablet (20 mg total) by mouth 3 (three) times daily as needed for spasms (abdominal pain).   EXCEDRIN PM 38-500 MG Tabs Generic drug:  diphenhydrAMINE-APAP (sleep) Take 1 tablet by mouth at bedtime as needed (sleep).   folic acid 1 MG tablet Commonly known as:  FOLVITE Take 1 tablet (1 mg total) by mouth daily.   gabapentin 600 MG tablet Commonly known as:  NEURONTIN Take 600 mg by mouth 2 (two) times daily.   levothyroxine 150 MCG tablet Commonly known as:  SYNTHROID, LEVOTHROID Take 1 tablet (150 mcg total) by mouth daily before breakfast.   LUBRICATING EYE DROPS OP Place 1 drop into both eyes daily as needed (allergies).   naproxen sodium 220 MG tablet Commonly known as:  ALEVE Take 220 mg by mouth 2 (two) times daily as needed (pain).   vitamin B-12 1000 MCG tablet Commonly known as:  CYANOCOBALAMIN Take 1 tablet (1,000 mcg total) by mouth daily.       Disposition and follow-up:   AdrienneAdrienne Flores was discharged from  [facility] in Stable condition.  At the hospital follow up visit please address:  1.  Gastroparesis: Acute flare, will need continued GI and pain clinic assistance.         Elevated Creatinine: Noted on admission, improved with fluids initially but trended slightly upward by discharge. Likely related to slight volume overload given mild edematous appearance at discharge.   2.  Labs / imaging needed at time of follow-up: BMP  3.  Pending labs/ test needing follow-up: n/a  Follow-up Appointments: Follow-up Information    Adrienne Pilar, MD. Schedule an appointment as soon as possible for a visit in 1 week(s).   Specialty:  Family Medicine Contact information: [place] [address] [phone]           Hospital Course by problem list: 1. Acute on chronic gastroparesis exacerbation: Adrienne Flores is a52 yo female witha medical history ofrecurrent abdominal pain,gastroparesis, diverticulitis, chronic back pain, rheumatoid arthritis, hypothyroidism, and CKD who presented with nausea and vomitingin the setting of a gastroparesis flare, which has been a chronic issue for the past 8 years. Acute abdominal process was ruled out by abdominal CT. Adrenal insufficiency was ruled out by normal stim test. Constipation seen on abdominal CT was treated with a bowel regimen. The patient appeared dehydrated on admission. She received supportive care during this flare of symptoms with IV fluids, electrolyte replacement, anti-emetics, Reglan, PRN bentyl, and PRN topical  capsaicin. She also received her home dose of tramadol, which is prescribed to her by a pain clinic. Because she has exhibited drug seeking behaviors in the past, IV opioids were avoided after initial stabilization of her pain. She was hospitalized recently at [facility] and discharged 9/21 for the same symptoms, where she was not given opioids for pain. She visited the [facility] ED on 9/23, but left AMA when she was  not given dilaudid for pain as requested. She presented to the [facility] ED hours later. At the time of discharge, the patient's abdominal pain and nausea were back to her baseline and she was able to tolerate water and small amounts of bland foods. She was strongly encouraged to continue many small meals daily of bland foods (avoiding fats and heavy protein meals). She was advised to f/u with her gastroenterologist and pain management clinic for continued management of her gastroparesis as well as her pain clinic given her ongoing pain.   2. Hypothyroidism: Adrienne Flores has a history of noncompliance with her synthroid. TSH 54 and T4 0.43. Her hypothyroidism may also be contributing to her GI symptoms and electrolyte abnormalities. Continued her home dose of synthroid during her hospitalization.  3. AKI: Cr elevated to 1.46 on admission. Likely prerenal etiology in the setting of dehydration. Cr improved initially with slight upward trend at discharge. Recommend repeating BMP/BMET in one week.  4. Transaminitis: AST elevated to 118 and ALT elevated to 66 on admission. Initial 2:1 pattern is indicative of alcohol use, although the patient denied etoh. AST improved to 51 and ALT improved to 42.  Discharge Vitals:   BP 119/86 (BP Location: Left Arm)   Pulse 80   Temp 99.5 F (37.5 C) (Oral)   Resp 19   Ht 5\' 1"  (1.549 m)   Wt 71.5 kg   SpO2 100%   BMI 29.78 kg/m   Pertinent Labs, Studies, and Procedures:  CBC Latest Ref Rng & Units 11/05/2017 11/04/2017 06/12/2017  WBC 4.0 - 10.5 K/uL 6.2 6.3 10.1  Hemoglobin 12.0 - 15.0 g/dL 8.4(L) 8.8(L) 8.9(L)  Hematocrit 36.0 - 46.0 % 25.6(L) 27.2(L) 26.4(L)  Platelets 150 - 400 K/uL 191 184 164   CMP Latest Ref Rng & Units 11/09/2017 11/08/2017 11/07/2017  Glucose 70 - 99 mg/dL 83 94 89  BUN 6 - 20 mg/dL 5(L) <5(L) <5(L)  Creatinine 0.44 - 1.00 mg/dL 1.22(H) 1.09(H) 1.02(H)  Sodium 135 - 145 mmol/L 133(L) 132(L) 132(L)  Potassium 3.5 - 5.1 mmol/L 3.5 3.4(L)  3.3(L)  Chloride 98 - 111 mmol/L 102 100 101  CO2 22 - 32 mmol/L 23 25 23   Calcium 8.9 - 10.3 mg/dL 8.1(L) 8.8(L) 8.5(L)  Total Protein 6.5 - 8.1 g/dL - - 6.8  Total Bilirubin 0.3 - 1.2 mg/dL - - 0.9  Alkaline Phos 38 - 126 U/L - - 174(H)  AST 15 - 41 U/L - - 51(H)  ALT 0 - 44 U/L - - 42   CT Abdomen pelvis 1. Subtle fluid stranding within the LEFT lower quadrant, and just proximal to the LEFT inguinal canal, of uncertain etiology or significance. Any recent surgery in this area? Recent femoral catheter insertion? If not, this could represent sequela of mild colitis or epiploic appendagitis. If symptoms persisted or worsened, or if symptoms became localized to the LEFT lower quadrant, would consider further characterization with contrast enhanced study. 2. Remainder of the abdomen and pelvis CT is unremarkable. No bowel obstruction. No evidence of acute solid organ abnormality. No renal  or ureteral calculi. Appendix is normal. Moderate amount of stool within the colon (constipation?).   Discharge Instructions: Discharge Instructions    Call MD for:  difficulty breathing, headache or visual disturbances   Complete by:  As directed    Call MD for:  persistant nausea and vomiting   Complete by:  As directed    Call MD for:  temperature >100.4   Complete by:  As directed    Diet - low sodium heart healthy   Complete by:  As directed    Discharge instructions   Complete by:  As directed    Thank you for your visit to the [facility]  With regard to your abdominal pain, I feel that following with your pain clinic and your primary gastroenterologist is the most ideal treatment plan. We have written for a few doses of Bentyl to be taken up to three times daily as needed for abdominal spasms.   Please contact your pain clinic to continue your tramadol.   It is essential that you remain hydrated. I recommend that you consume at least a mouth full of water every 10-15 min. Or  for example if while watching TV you were to take a drink every commercial that may suffice.   If you develop worsening pain or other concerning symptoms please contact our primary care doctor or gastroenterologist for assistance.   Increase activity slowly   Complete by:  As directed       Signed: Kathi Ludwig, MD 11/09/2017, 9:01 AM   Pager: [phone]

## 2017-11-08 NOTE — Progress Notes (Signed)
   Subjective: No overnight events. Ms. Sausedo reports that her pain has improved back to her baseline level of pain. She has been trying to eat and drink. She was able to eat half a spinach sub for dinner last night without vomiting. She has worked with both PT and OT and was able to ambulate out of bed. She has no new symptoms or concerns today.   Objective:  Vital signs in last 24 hours: Vitals:   11/07/17 0824 11/07/17 1334 11/07/17 2209 11/08/17 0559  BP: (!) 146/86 (!) 145/88 (!) 142/92 130/85  Pulse: 87 77 87 81  Resp: 14 14 18 16   Temp: 99.1 F (37.3 C) 97.8 F (36.6 C) 97.9 F (36.6 C) 98.5 F (36.9 C)  TempSrc: Oral Oral Oral Oral  SpO2: 100% 100% 100% 99%  Weight:      Height:       Constitutional:Laying comfortably in bed without distress. Abdominal:Abdomen is soft and non-distended. Mild ttp diffusely (appears improved compared to yesterday). Ext: No lower extremity edema. Neurological: She isalertand orientedx3. Skin: Skin iswarmand dry.  Assessment/Plan:  Principal Problem:   Gastroparesis Active Problems:   Hypothyroidism   AKI (acute kidney injury) ([place])   Transaminitis   Respiratory alkalosis  Ms. Balster is a59 yo female witha medical history ofrecurrent abdominal pain,gastroparesis, diverticulitis, chronic back pain, rheumatoid arthritis, hypothyroidism, andCKDpresenting with nausea and vomitingin the setting of a gastroparesis flare.  Gastroparesis exacerbation - Pt reports that she feels back to her baseline level of gastroparesis pain and nausea. She has been slowly trying PO foods and liquids and has had no episodes of vomiting. She appears improved on exam today with less abdominal tenderness. - She remains hyponatremic at 132 and hypokalemic to 3.4. Will continue supportive therapy with fluids and K replacement until the patient is able to hydrate and eat well.  Plan - IV NS at 125cc/hr - Oral K supplement - Resume home tramadol  at 50 mg q8hrs for pain - TylenolPRNfor pain - Zofran 4mg  q6hrs PRN for nausea - Continue home gabapentin 600 mg BID - Reglan 10 mg IV TID - Bentyl 20 mg TID PRN for pain - Capsaicin cream BID PRN for pain - Bowel regimen with miralax and senokot  - Advance diet as she tolerates  - Continue synthroid 150 mcg daily  Dispo: Anticipated discharge either today or tomorrow pending the patient's ability to hydrate and eat.  Lainey Nelson, Andree Elk, MD 11/08/2017, 10:20 AM Pager: [phone]

## 2017-11-09 LAB — BASIC METABOLIC PANEL
Anion gap: 8 (ref 5–15)
BUN: 5 mg/dL — AB (ref 6–20)
CALCIUM: 8.1 mg/dL — AB (ref 8.9–10.3)
CO2: 23 mmol/L (ref 22–32)
CREATININE: 1.22 mg/dL — AB (ref 0.44–1.00)
Chloride: 102 mmol/L (ref 98–111)
GFR calc non Af Amer: 49 mL/min — ABNORMAL LOW (ref >60)
GFR, EST AFRICAN AMERICAN: 57 mL/min — AB (ref >60)
Glucose, Bld: 83 mg/dL (ref 70–99)
Potassium: 3.5 mmol/L (ref 3.5–5.1)
Sodium: 133 mmol/L — ABNORMAL LOW (ref 135–145)

## 2017-11-09 MED ORDER — DICYCLOMINE HCL 20 MG PO TABS: 20.0000 mg | ORAL_TABLET | Freq: Three times a day (TID) | ORAL | 0 refills | Status: DC | PRN

## 2017-11-09 MED ORDER — HEPARIN SOD (PORK) LOCK FLUSH 100 UNIT/ML IV SOLN
500.0000 [IU] | INTRAVENOUS | Status: AC | PRN
  Administered 2017-11-09: 500 [IU]

## 2017-11-09 NOTE — Progress Notes (Signed)
   Subjective: The patient stated that she has been able to increase her oral intake significantly but not to baseline. I again advised that she eat many smaller meals or snacks throughout the day as opposed to large meals. In addition she was again advised to consume water vigorously throughout the day. She is in agreement with the plan to discharge home today.   Objective:  Vital signs in last 24 hours: Vitals:   11/08/17 0559 11/08/17 1406 11/08/17 2144 11/09/17 0517  BP: 130/85 116/81 136/85 119/86  Pulse: 81 89 80 80  Resp: 16  18 19   Temp: 98.5 F (36.9 C) 98.4 F (36.9 C) 99.1 F (37.3 C) 99.5 F (37.5 C)  TempSrc: Oral Oral Oral Oral  SpO2: 99% 100% 100% 100%  Weight:      Height:       Physical exam: General: A/O x4, in no acute distress, afebrile, nondiaphoretic  Cardio: RRR, no mrg's auscultated Pulm: CTA bilaterally MSK: bilateral lower extremities nontender, nonedematous   Assessment/Plan:  Principal Problem:   Gastroparesis Active Problems:   Hypothyroidism   AKI (acute kidney injury) ([facility])   Transaminitis   Respiratory alkalosis  Ms. Dulski is a 52 yo F w/ a PMHx notable for recurrent chronic abdominal pain w/w to gastroparesis and IBS, diverticulitis, chronic back pain, rheumatoid arthritis, hypothyroidism, who presented to the ED w/ nausea and vomiting. She improved symptomatically with fluids, pain control, and slow advances in her diet. She is advised to follow-up with her pain clinic and GI physician as an outpatient.   Gastroparesis exacerbation: Feeling at baseline today. Pain is persistent but not worse that typical. Patient appeared comfortable asleep in her bed today upon entering the room but immediatly began to be concerned with 10/10 abdominal pain after waking.  Electrolytes have improved. We will discharge her home today to follow-up with her pain clinic, PCP and GI doctor. She will be discharged with a short prescription for PRN bentyl as this  appears to provide at least some benefit. She did not feel as if the capsicin cream was of benefit. She is advised to continue to closely advance her diet at home and to remain hydrated. Patient and spouse are in agreement with the plan for discharge home. She denied any questions or concerns.   Dispo: Anticipated discharge in approximately 0 day(s).   Kathi Ludwig, MD 11/09/2017, 7:21 AM Pager: Pager# [phone]

## 2017-11-14 LAB — BLOOD GAS, VENOUS
ACID-BASE DEFICIT: 1.1 mmol/L (ref 0.0–2.0)
BICARBONATE: 21.3 mmol/L (ref 20.0–28.0)
O2 Saturation: 15.7 %
PCO2 VEN: 25 mmHg — AB (ref 44.0–60.0)
Patient temperature: 98.6
pH, Ven: 7.539 — ABNORMAL HIGH (ref 7.250–7.430)

## 2017-11-18 ENCOUNTER — Other Ambulatory Visit: Payer: Medicare Other

## 2018-03-28 IMAGING — CT CT ABD-PELV W/ CM
4 of 5 series · 12 of 46 positions shown, 17 images · IV contrast (APPLIED)
Comparison: February 07, 2017

CLINICAL DATA: Months of abdominal pain.

EXAM:
CT ABDOMEN AND PELVIS WITH CONTRAST
TECHNIQUE: Multidetector CT imaging of the abdomen and pelvis was performed
using the standard protocol following bolus administration of
intravenous contrast.
CONTRAST:  100mL OR1OTR-G11 IOPAMIDOL (OR1OTR-G11) INJECTION 61%

[Series 3: abdomen 5.0 · axial · 0.93mm/px · z∈[+1034,+1344]mm · 6 of 88 slices shown, 11 images]
[im 13/88  soft-tissue]
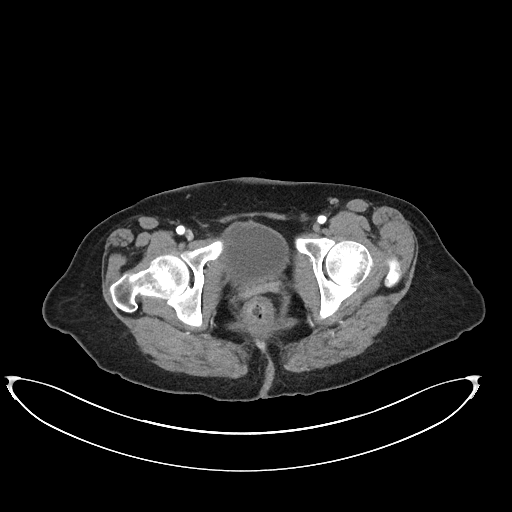
[im 13/88  bone]
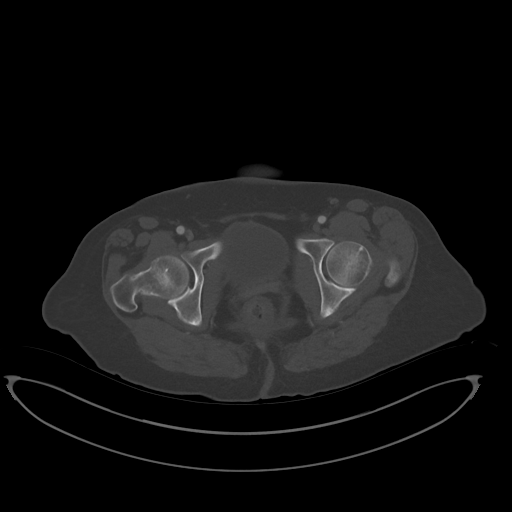
[im 25/88  soft-tissue]
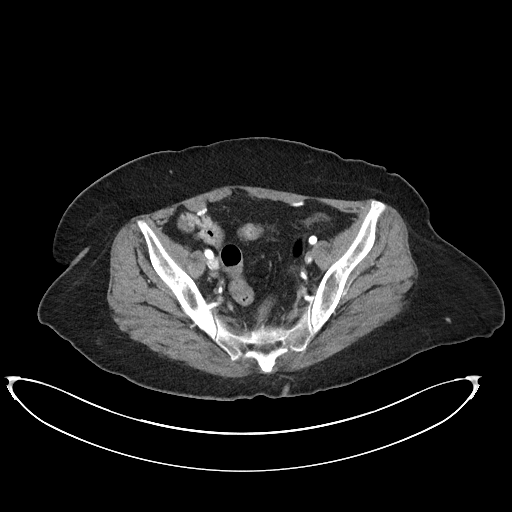
[im 38/88  soft-tissue]
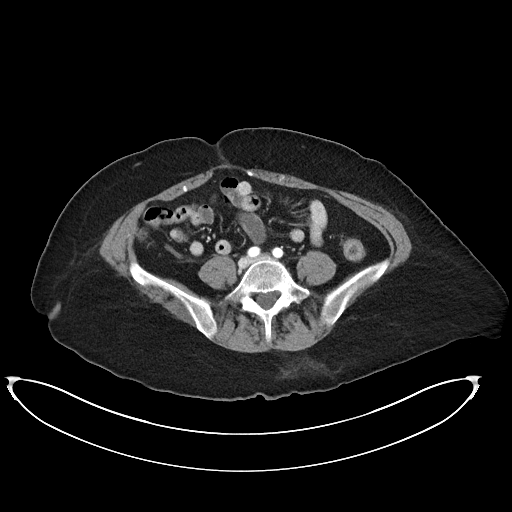
[im 38/88  lung]
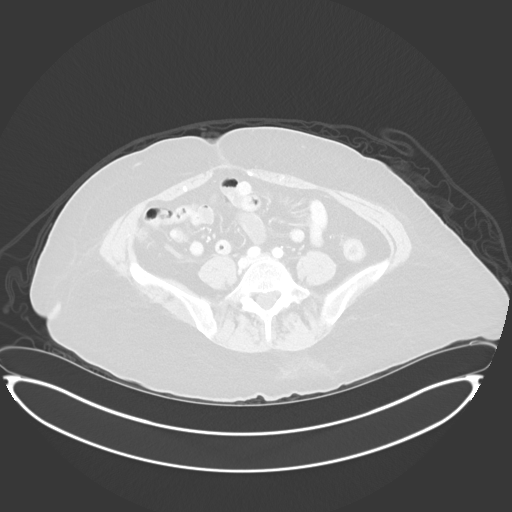
[im 50/88  soft-tissue]
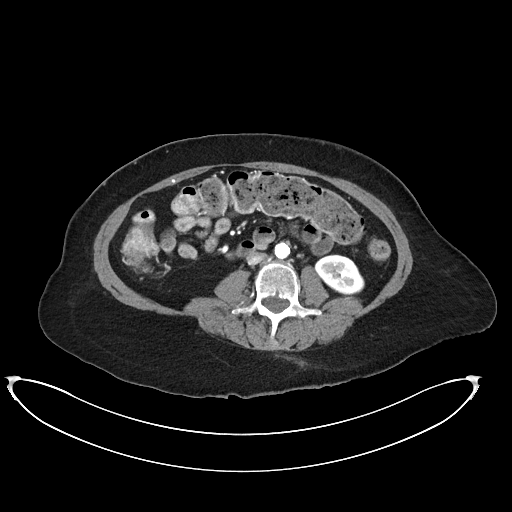
[im 50/88  lung]
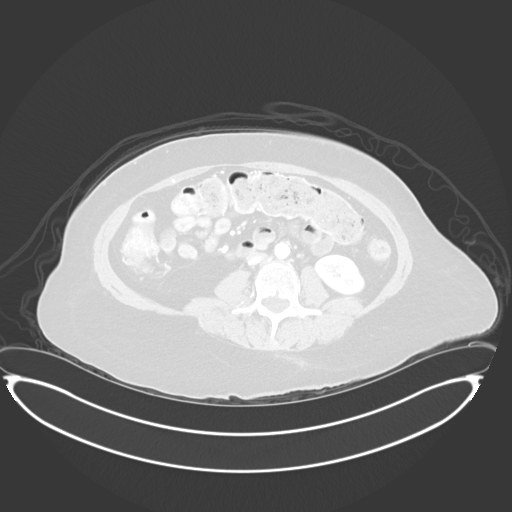
[im 63/88  soft-tissue]
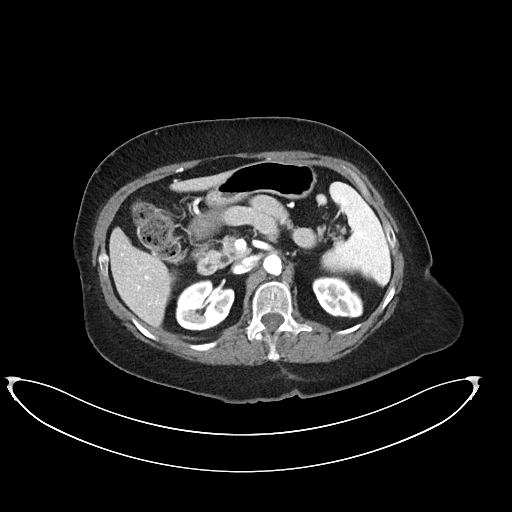
[im 63/88  lung]
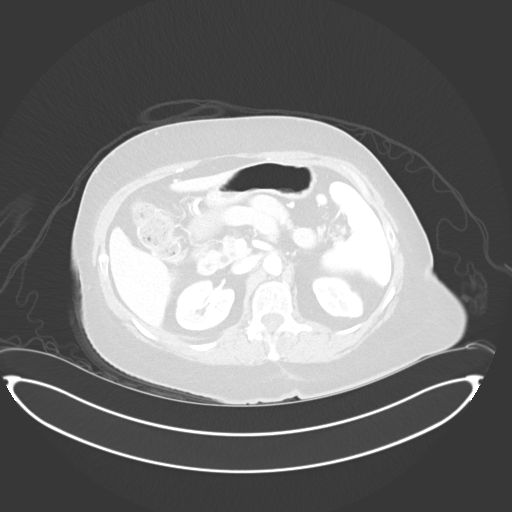
[im 75/88  soft-tissue]
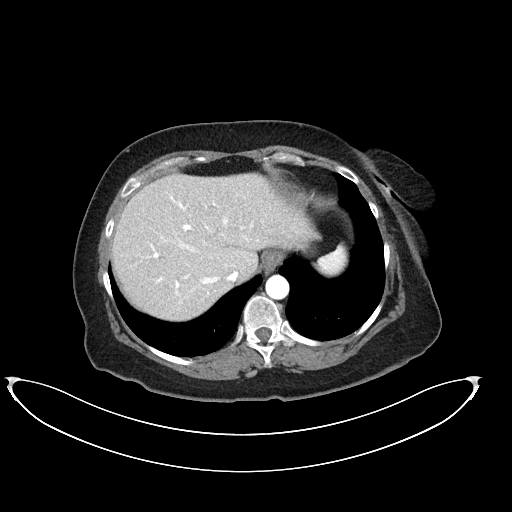
[im 75/88  lung]
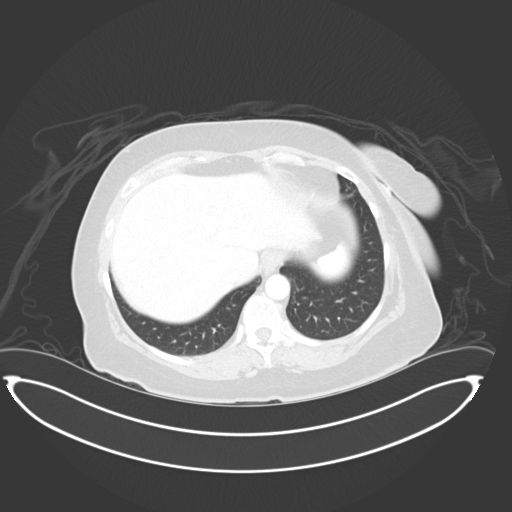

[Series 5: lung · axial · 0.93mm/px · z∈[+1015,+1059]mm · 2 of 219 slices shown]
[im 22/219  bone]
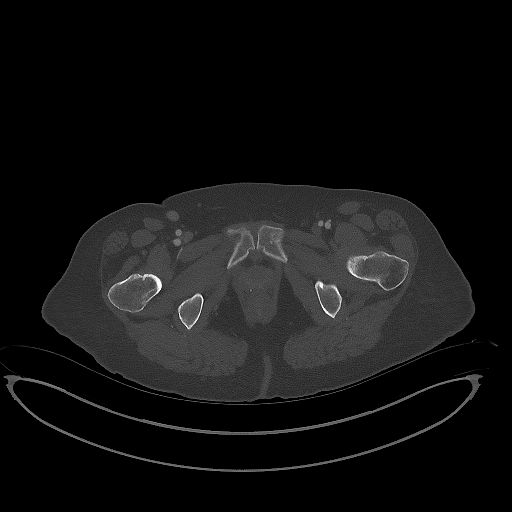
[im 44/219  bone]
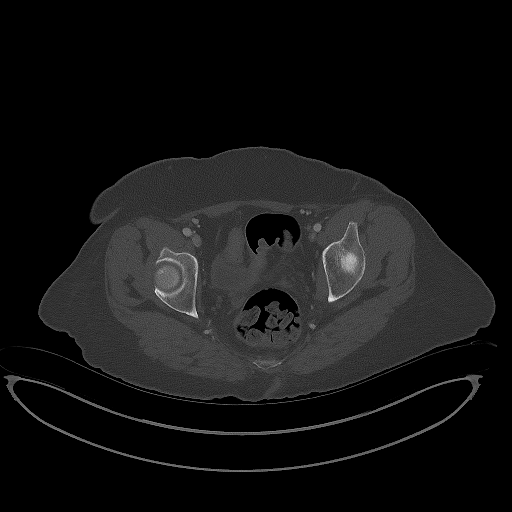

[Series 6: abdomen 3.0 mpr cor · coronal · 0.71mm/px · 3 of 72 slices shown]
[im 24/72  soft-tissue]
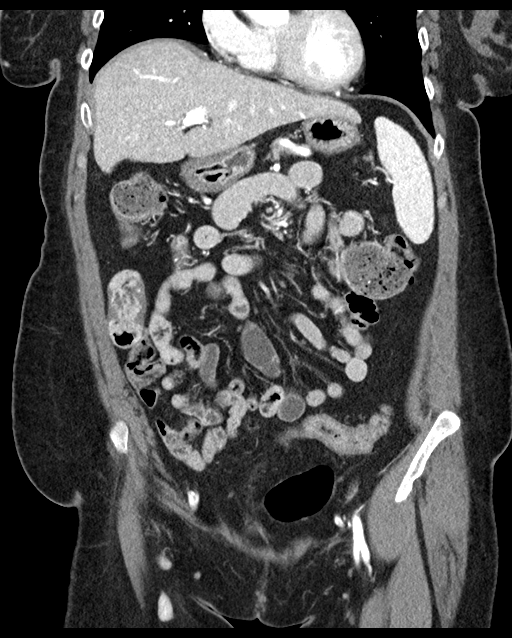
[im 32/72  soft-tissue]
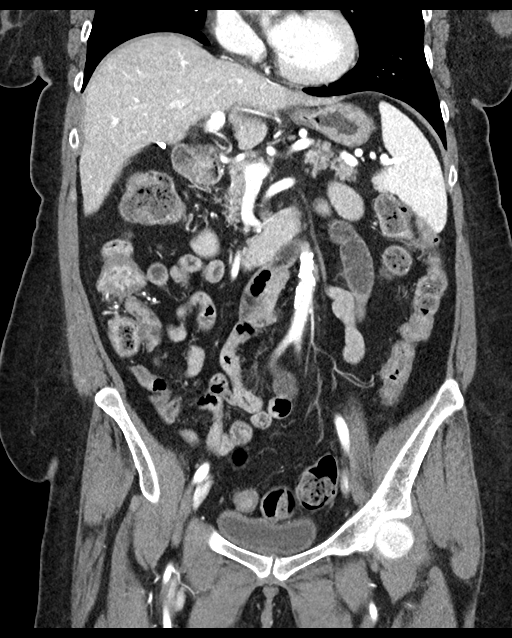
[im 40/72  soft-tissue]
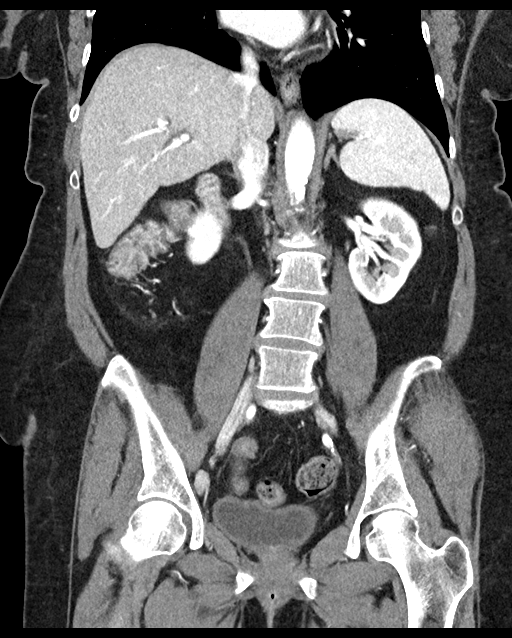

[Series 7: abdomen 3.0 mpr sag · sagittal · 0.57mm/px · 1 of 103 slices shown]
[im 35/103  soft-tissue]
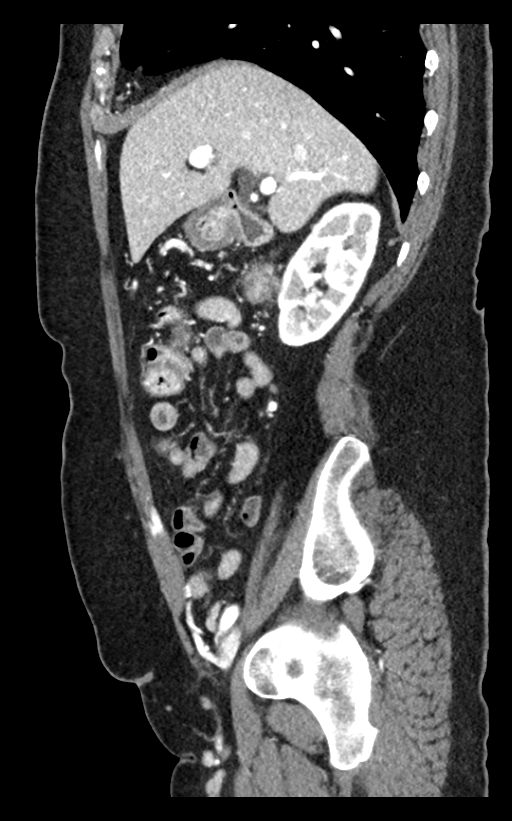

[12 of 46 positions shown; findings below may reference images not displayed]

FINDINGS: Lower chest: No acute abnormality.

Hepatobiliary: Hepatic steatosis. Previous cholecystectomy. The
portal vein is patent. No other abnormalities in the liver.

Pancreas: Unremarkable. No pancreatic ductal dilatation or
surrounding inflammatory changes.

Spleen: Normal in size without focal abnormality.

Adrenals/Urinary Tract: The adrenal glands, kidneys, ureters, and
bladder are normal.

Stomach/Bowel: The stomach is normal. Most of the small bowel is
normal. There may be mild thickening in the distal ileum although
this is unclear. Moderate fecal loading in the colon, particularly
the transverse colon. There is wall thickening associated with a
portion of the ascending colon and the proximal transverse colon.
The appendix is normal in caliber. There is a small amount of
increased attenuation in the fat near the distal appendix but this
is thought to be tracking down the pericolic gutter from the thick
walled colon.

Vascular/Lymphatic: Atherosclerotic changes seen in the
nonaneurysmal aorta. No adenopathy.

Reproductive: Status post hysterectomy. No adnexal masses.

Other: No abdominal wall hernia or abnormality. No abdominopelvic
ascites.

Musculoskeletal: No acute or significant osseous findings.
IMPRESSION: 1. There is wall thickening associated with a portion of ascending
colon and the proximal transverse colon. There may be minimal
thickening associated with the distal ileum as well best seen on
coronal images. The findings suggest colitis. Given the chronicity
of several months, inflammatory bowel disease should be considered.
Infectious causes are possible. Ischemic causes are less likely
given chronicity.
2. The appendix is normal in caliber. The small amount of increased
attenuation in the fat near the distal appendix appears to be
tracking inferiorly along the pericolic gutter, likely arising from
the colon. There is no dilatation or wall thickening associated with
the appendix to suggest appendicitis. Recommend clinical
correlation.
3. Atherosclerotic change in the abdominal aorta.

## 2018-05-19 ENCOUNTER — Other Ambulatory Visit: Payer: Medicare Other

## 2018-05-19 ENCOUNTER — Inpatient Hospital Stay: Payer: Medicare Other | Attending: Oncology

## 2018-05-19 ENCOUNTER — Ambulatory Visit: Payer: Medicare Other | Admitting: Oncology

## 2018-05-19 ENCOUNTER — Inpatient Hospital Stay: Payer: Medicare Other | Admitting: Oncology

## 2018-06-05 ENCOUNTER — Encounter: Payer: Self-pay | Admitting: *Deleted

## 2018-06-09 ENCOUNTER — Telehealth: Payer: Self-pay | Admitting: *Deleted

## 2018-06-09 NOTE — Telephone Encounter (Signed)
Unable to contact pt over las 2 weeks and patient had 3 no show in a row and sent patient letter to discharge her from our cancer center. Patient was IDA and was last seen April 2019. Put in mail on 4/24 however it will not go out til 4/27. Sent certified mail.

## 2018-06-16 ENCOUNTER — Other Ambulatory Visit: Payer: Self-pay | Admitting: Gastroenterology

## 2018-06-16 DIAGNOSIS — R634 Abnormal weight loss: Secondary | ICD-10-CM

## 2018-06-16 DIAGNOSIS — R1010 Upper abdominal pain, unspecified: Secondary | ICD-10-CM

## 2018-06-18 ENCOUNTER — Ambulatory Visit: Admission: RE | Admit: 2018-06-18 | Payer: Medicare Other | Source: Ambulatory Visit

## 2018-07-12 ENCOUNTER — Encounter: Payer: Self-pay | Admitting: Emergency Medicine

## 2018-07-12 ENCOUNTER — Emergency Department
Admission: EM | Admit: 2018-07-12 | Discharge: 2018-07-12 | Disposition: A | Discharge status: Home / Self Care | Payer: Medicare Other | Attending: Emergency Medicine | Admitting: Emergency Medicine

## 2018-07-12 ENCOUNTER — Other Ambulatory Visit: Payer: Self-pay

## 2018-07-12 DIAGNOSIS — G8929 Other chronic pain: Secondary | ICD-10-CM | POA: Diagnosis not present

## 2018-07-12 DIAGNOSIS — M545 Low back pain: Secondary | ICD-10-CM | POA: Diagnosis present

## 2018-07-12 MED ORDER — TRAMADOL HCL 50 MG PO TABS
50.0000 mg | ORAL_TABLET | Freq: Once | ORAL | Status: AC
  Administered 2018-07-12: 50 mg via ORAL
  Filled 2018-07-12: qty 1

## 2018-07-12 MED ORDER — MELOXICAM 15 MG PO TABS: 15.0000 mg | ORAL_TABLET | Freq: Every day | ORAL | 0 refills | Status: DC

## 2018-07-12 MED ORDER — TRAMADOL HCL 50 MG PO TABS: 50.0000 mg | ORAL_TABLET | Freq: Four times a day (QID) | ORAL | 0 refills | Status: AC | PRN

## 2018-07-12 MED ORDER — NAPROXEN 500 MG PO TABS
500.0000 mg | ORAL_TABLET | Freq: Once | ORAL | Status: AC
  Administered 2018-07-12: 500 mg via ORAL
  Filled 2018-07-12: qty 1

## 2018-07-12 NOTE — ED Triage Notes (Addendum)
Low back pain x 4 days. Denies fall or injury. Denies radiating to either leg. During triage states has had some headaches last few days and has recent stroke. However states headache is very minimal and has no neuro deficits and that the back pain is the cause for her visit.

## 2018-07-12 NOTE — ED Provider Notes (Signed)
[facility] EMERGENCY DEPARTMENT Provider Note   CSN: [id number] Arrival date & time: 07/12/18  1229    History   Chief Complaint Chief Complaint  Patient presents with  . Back Pain    HPI Adrienne Flores is a 53 y.o. female.     53 year old female presenting to the emergency department for treatment of chronic low back pain that has been worse over the past 4 days.  No recent injury.  Pain is in the same location as her chronic pain, just seems to be worse.  Some relief with tramadol and Zanaflex.     Past Medical History:  Diagnosis Date  . Anemia   . At high risk for falls   . Chronic abdominal pain 2008   dates back to at least 2008.  multiple ultrasound, CT scans, x rays at [facility], [place].  [facility] colonoscopy, egd unrevealing.    . Colitis 2015   ? colitis per CTs in 2015, 2016 and 11/2015.  presumed infectious.  colonoscopy in 2015 negative for colitis.   . Constipation   . Cyclic vomiting syndrome 2015   per opinion  of Dr Granville Lewis, IBS specialist at [facility].   . Degenerative disc disease   . Depression   . Fibromyalgia   . Gastroparesis 03/2011   markedly abnormal gastric emptying study.   . Hematemesis 11/2015   mild to moderate blood in emesis appeared after 2 plus weeks of frequent, non-bloody emesis.   [name] Hemorrhoids   . Hemorrhoids   . History of chicken pox   . Hypokalemia   . Hypothyroid   . Lupus ([place])   . 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 Rheumatoid arthritis [facility])     Patient Active Problem List   Diagnosis Date Noted  . Respiratory alkalosis 11/05/2017  . Hypotension 06/08/2017  . Hypokalemia 06/08/2017  . Mechanical complication of vascular device 05/16/2017  . 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 Loss of weight   . Abdominal pain, generalized   . Gastritis without bleeding   . Iron deficiency anemia 07/02/2016  . Disoriented to time 01/24/2016  . Protein-calorie malnutrition, severe 12/12/2015  . Symptomatic anemia  12/09/2015  . Acute hyponatremia 12/09/2015  . Hematemesis 12/09/2015  . Chronic hoarseness 12/09/2015  . Unintended weight loss 12/09/2015  . H/O medication noncompliance 12/07/2015  . Chronic lower back pain 08/30/2014  . Rheu arthrit of right ank/ft w involv of organs and systems ([place]) 06/16/2014  . DDD (degenerative disc disease), cervical 06/16/2014  . 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 DDD (degenerative disc disease), lumbosacral 06/16/2014  . Diarrhea 08/22/2013  . Hypokalemia due to inadequate potassium intake 08/22/2013  . Chronic abdominal and back  pain 07/26/2013  . Nausea and vomiting 07/26/2013  . 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 Rheumatoid arthritis ([place]) 07/26/2013  . Acute on chronic renal failure ([place]) 06/27/2013  . AKI (acute kidney injury) ([place]) 06/25/2013  . Transaminitis 06/25/2013  . Gastroparesis 11/26/2012  . Hypothyroidism 11/26/2012  . Hyperlipidemia 11/03/2012  . Lightheadedness 10/26/2012  . Other specified abnormal immunological findings in serum 10/01/2012  . Noninfectious gastroenteritis and colitis 01/19/2012  . Primary localized osteoarthrosis of hand 04/17/2011  . Cyclic vomiting syndrome 09/14/2010  . Mixed urge and stress incontinence 08/01/2010  . Depressive disorder 12/20/2009    Past Surgical History:  Procedure Laterality Date  . ABDOMINAL HYSTERECTOMY     total  . CESAREAN SECTION     x2  . CHOLECYSTECTOMY    .  COLONOSCOPY  11/2013   at [facility] Dr Kennith Gain. Hemorrhoids. sigmoid tics. random biopsies negative for microscopic colitis or any pathology.   . COLONOSCOPY WITH PROPOFOL N/A 05/10/2017   Procedure: COLONOSCOPY WITH PROPOFOL;  Surgeon: Lucilla Lame, MD;  Location: [facility];  Service: Endoscopy;  Laterality: N/A;  . ESOPHAGOGASTRODUODENOSCOPY  11/2013   Dr Kennith Gain at [facility].  Normal study.  Duodenal bx negative for villous atrophy.   . ESOPHAGOGASTRODUODENOSCOPY N/A 12/09/2015   Procedure:  ESOPHAGOGASTRODUODENOSCOPY (EGD);  Surgeon: Doran Stabler, MD;  Location: [facility] ENDOSCOPY;  Service: Endoscopy;  Laterality: N/A;  . ESOPHAGOGASTRODUODENOSCOPY (EGD) WITH PROPOFOL N/A 05/10/2017   Procedure: ESOPHAGOGASTRODUODENOSCOPY (EGD) WITH PROPOFOL;  Surgeon: Lucilla Lame, MD;  Location: [facility];  Service: Endoscopy;  Laterality: N/A;  . KNEE SURGERY Left   . OOPHORECTOMY    . PORTA CATH INSERTION N/A 04/18/2017   Procedure: PORTA CATH INSERTION;  Surgeon: Katha Cabal, MD;  Location: [facility] CV LAB;  Service: Cardiovascular;  Laterality: N/A;  . PORTA CATH INSERTION Right 05/21/2017   Procedure: PORTA CATH INSERTION;  Surgeon: Katha Cabal, MD;  Location: [facility] CV LAB;  Service: Cardiovascular;  Laterality: Right;  . REPLACEMENT TOTAL KNEE Bilateral      OB History   No obstetric history on file.      Home Medications    Prior to Admission medications   Medication Sig Start Date End Date Taking? Authorizing Provider  Carboxymethylcellul-Glycerin (LUBRICATING EYE DROPS OP) Place 1 drop into both eyes daily as needed (allergies).    [provider]  dicyclomine (BENTYL) 20 MG tablet Take 1 tablet (20 mg total) by mouth 3 (three) times daily as needed for spasms (abdominal pain). 11/09/17   Kathi Ludwig, MD  diphenhydrAMINE-APAP, sleep, (EXCEDRIN PM) 38-500 MG TABS Take 1 tablet by mouth at bedtime as needed (sleep).    [provider]  folic acid (FOLVITE) 1 MG tablet Take 1 tablet (1 mg total) by mouth daily. 06/13/17   Max Sane, MD  gabapentin (NEURONTIN) 600 MG tablet Take 600 mg by mouth 2 (two) times daily.  08/01/10   [provider]  levothyroxine (SYNTHROID, LEVOTHROID) 150 MCG tablet Take 1 tablet (150 mcg total) by mouth daily before breakfast. 06/13/17   Max Sane, MD  meloxicam (MOBIC) 15 MG tablet Take 1 tablet (15 mg total) by mouth daily. 07/12/18   Cydnie Deason B, FNP  naproxen sodium (ALEVE) 220 MG  tablet Take 220 mg by mouth 2 (two) times daily as needed (pain).    [provider]  traMADol (ULTRAM) 50 MG tablet Take 1 tablet (50 mg total) by mouth every 6 (six) hours as needed. 07/12/18   Andreal Vultaggio, Johnette Abraham B, FNP  vitamin B-12 (CYANOCOBALAMIN) 1000 MCG tablet Take 1 tablet (1,000 mcg total) by mouth daily. 06/12/17   Max Sane, MD    Family History Family History  Problem Relation Age of Onset  . Leukemia Mother   . Arthritis Mother   . Early death Mother   . Miscarriages / [place] Mother   . Hypothyroidism Sister   . Cancer Paternal Grandmother   . Cancer Paternal Grandfather   . Hypothyroidism Brother     Social History Social History   Tobacco Use  . Smoking status: Never Smoker  . Smokeless tobacco: Never Used  Substance Use Topics  . Alcohol use: No  . Drug use: No     Allergies   Morphine; Morphine and related; Methotrexate derivatives; Nabumetone;  Methotrexate; and Haloperidol   Review of Systems Review of Systems  Constitutional: Negative.   HENT: Negative.   Eyes: Negative.   Respiratory: Negative.   Cardiovascular: Negative.   Musculoskeletal: Positive for back pain.  Skin: Negative for rash and wound.  Neurological: Negative for numbness.  Psychiatric/Behavioral: The patient is nervous/anxious.      Physical Exam Updated Vital Signs BP (!) 147/86   Pulse 75   Temp 98.3 F (36.8 C) (Oral)   Resp 20   Ht 5\' 1"  (1.549 m)   Wt 62.6 kg   SpO2 100%   BMI 26.07 kg/m   Physical Exam Vitals signs reviewed.  HENT:     Head: Normocephalic.  Neck:     Musculoskeletal: Neck supple.  Musculoskeletal: Normal range of motion.     Lumbar back: She exhibits tenderness and pain. She exhibits no bony tenderness and no spasm.  Skin:    General: Skin is warm and dry.  Neurological:     General: No focal deficit present.     Mental Status: She is alert.     Cranial Nerves: Cranial nerves are intact.     Sensory: Sensation is intact.      Motor: No weakness.     Coordination: Coordination is intact.     Gait: Gait is intact.  Psychiatric:        Mood and Affect: Mood is anxious. Affect is tearful.      ED Treatments / Results  Labs (all labs ordered are listed, but only abnormal results are displayed) Labs Reviewed - No data to display  EKG None  Radiology No results found.  Procedures Procedures (including critical care time)  Medications Ordered in ED Medications  naproxen (NAPROSYN) tablet 500 mg (500 mg Oral Given 07/12/18 1330)  traMADol (ULTRAM) tablet 50 mg (50 mg Oral Given 07/12/18 1330)     Initial Impression / Assessment and Plan / ED Course  I have reviewed the triage vital signs and the nursing notes.  Pertinent labs & imaging results that were available during my care of the patient were reviewed by me and considered in my medical decision making (see chart for details).  53 year old female presenting to the emergency department for treatment and evaluation of acute on chronic back pain.  She will be given prescriptions for meloxicam and tramadol and advised to schedule appointment with neurosurgery.  She was advised to return to the emergency department for symptoms change or worsen if unable to schedule an appointment.  Final Clinical Impressions(s) / ED Diagnoses   Final diagnoses:  Chronic bilateral low back pain without sciatica    ED Discharge Orders         Ordered    traMADol (ULTRAM) 50 MG tablet  Every 6 hours PRN     07/12/18 1331    meloxicam (MOBIC) 15 MG tablet  Daily     07/12/18 1331           Victorino Dike, FNP 07/12/18 1350    Delman Kitten, MD 07/12/18 1604

## 2018-07-22 ENCOUNTER — Telehealth: Payer: Self-pay | Admitting: *Deleted

## 2018-07-22 NOTE — Telephone Encounter (Signed)
Pt was sent closing chart to our services due to patient had missed several appts in a row. We had called her several times and left messages and no one returned call. Sent a certified letter to address on file and it was returned with stamp saying return to sender, unclaimed-unable to forward.

## 2021-02-26 ENCOUNTER — Encounter: Payer: Self-pay | Admitting: Emergency Medicine

## 2021-02-26 ENCOUNTER — Other Ambulatory Visit: Payer: Self-pay

## 2021-02-26 ENCOUNTER — Emergency Department: Payer: Medicare HMO

## 2021-02-26 ENCOUNTER — Inpatient Hospital Stay
Admission: EM | Admit: 2021-02-26 | Discharge: 2021-03-05 | DRG: 177 | Disposition: A | Discharge status: Home / Self Care | Payer: Medicare HMO | Attending: Family Medicine | Admitting: Family Medicine

## 2021-02-26 ENCOUNTER — Encounter: Payer: Self-pay | Admitting: Oncology

## 2021-02-26 DIAGNOSIS — D61818 Other pancytopenia: Secondary | ICD-10-CM | POA: Diagnosis present

## 2021-02-26 DIAGNOSIS — H7091 Unspecified mastoiditis, right ear: Secondary | ICD-10-CM

## 2021-02-26 DIAGNOSIS — R918 Other nonspecific abnormal finding of lung field: Secondary | ICD-10-CM

## 2021-02-26 DIAGNOSIS — Z7982 Long term (current) use of aspirin: Secondary | ICD-10-CM | POA: Diagnosis not present

## 2021-02-26 DIAGNOSIS — E876 Hypokalemia: Secondary | ICD-10-CM | POA: Diagnosis present

## 2021-02-26 DIAGNOSIS — I959 Hypotension, unspecified: Secondary | ICD-10-CM | POA: Diagnosis present

## 2021-02-26 DIAGNOSIS — Z79899 Other long term (current) drug therapy: Secondary | ICD-10-CM | POA: Diagnosis not present

## 2021-02-26 DIAGNOSIS — K529 Noninfective gastroenteritis and colitis, unspecified: Secondary | ICD-10-CM

## 2021-02-26 DIAGNOSIS — R5381 Other malaise: Secondary | ICD-10-CM | POA: Diagnosis present

## 2021-02-26 DIAGNOSIS — D62 Acute posthemorrhagic anemia: Secondary | ICD-10-CM | POA: Diagnosis present

## 2021-02-26 DIAGNOSIS — E861 Hypovolemia: Secondary | ICD-10-CM | POA: Diagnosis present

## 2021-02-26 DIAGNOSIS — G4710 Hypersomnia, unspecified: Secondary | ICD-10-CM | POA: Diagnosis present

## 2021-02-26 DIAGNOSIS — G8929 Other chronic pain: Secondary | ICD-10-CM | POA: Diagnosis present

## 2021-02-26 DIAGNOSIS — R519 Headache, unspecified: Secondary | ICD-10-CM | POA: Diagnosis not present

## 2021-02-26 DIAGNOSIS — N1831 Chronic kidney disease, stage 3a: Secondary | ICD-10-CM | POA: Diagnosis present

## 2021-02-26 DIAGNOSIS — Z79890 Hormone replacement therapy (postmenopausal): Secondary | ICD-10-CM | POA: Diagnosis not present

## 2021-02-26 DIAGNOSIS — J1282 Pneumonia due to coronavirus disease 2019: Secondary | ICD-10-CM | POA: Diagnosis present

## 2021-02-26 DIAGNOSIS — B957 Other staphylococcus as the cause of diseases classified elsewhere: Secondary | ICD-10-CM | POA: Diagnosis not present

## 2021-02-26 DIAGNOSIS — F419 Anxiety disorder, unspecified: Secondary | ICD-10-CM | POA: Diagnosis present

## 2021-02-26 DIAGNOSIS — E639 Nutritional deficiency, unspecified: Secondary | ICD-10-CM | POA: Diagnosis present

## 2021-02-26 DIAGNOSIS — F32A Depression, unspecified: Secondary | ICD-10-CM | POA: Diagnosis present

## 2021-02-26 DIAGNOSIS — E872 Acidosis, unspecified: Secondary | ICD-10-CM | POA: Diagnosis present

## 2021-02-26 DIAGNOSIS — R0902 Hypoxemia: Secondary | ICD-10-CM | POA: Diagnosis present

## 2021-02-26 DIAGNOSIS — E669 Obesity, unspecified: Secondary | ICD-10-CM | POA: Diagnosis present

## 2021-02-26 DIAGNOSIS — U071 COVID-19: Principal | ICD-10-CM | POA: Diagnosis present

## 2021-02-26 DIAGNOSIS — I9589 Other hypotension: Secondary | ICD-10-CM | POA: Diagnosis present

## 2021-02-26 DIAGNOSIS — M797 Fibromyalgia: Secondary | ICD-10-CM | POA: Diagnosis present

## 2021-02-26 DIAGNOSIS — R7881 Bacteremia: Secondary | ICD-10-CM | POA: Diagnosis present

## 2021-02-26 DIAGNOSIS — K3184 Gastroparesis: Secondary | ICD-10-CM | POA: Diagnosis present

## 2021-02-26 DIAGNOSIS — Z91199 Patient's noncompliance with other medical treatment and regimen due to unspecified reason: Secondary | ICD-10-CM

## 2021-02-26 DIAGNOSIS — A048 Other specified bacterial intestinal infections: Secondary | ICD-10-CM | POA: Diagnosis present

## 2021-02-26 DIAGNOSIS — Z96653 Presence of artificial knee joint, bilateral: Secondary | ICD-10-CM | POA: Diagnosis present

## 2021-02-26 DIAGNOSIS — Z806 Family history of leukemia: Secondary | ICD-10-CM

## 2021-02-26 DIAGNOSIS — M069 Rheumatoid arthritis, unspecified: Secondary | ICD-10-CM | POA: Diagnosis present

## 2021-02-26 DIAGNOSIS — E039 Hypothyroidism, unspecified: Secondary | ICD-10-CM | POA: Diagnosis present

## 2021-02-26 DIAGNOSIS — H7090 Unspecified mastoiditis, unspecified ear: Secondary | ICD-10-CM | POA: Diagnosis present

## 2021-02-26 DIAGNOSIS — R1084 Generalized abdominal pain: Secondary | ICD-10-CM

## 2021-02-26 DIAGNOSIS — Z90710 Acquired absence of both cervix and uterus: Secondary | ICD-10-CM

## 2021-02-26 DIAGNOSIS — R109 Unspecified abdominal pain: Secondary | ICD-10-CM

## 2021-02-26 DIAGNOSIS — D509 Iron deficiency anemia, unspecified: Secondary | ICD-10-CM | POA: Diagnosis present

## 2021-02-26 LAB — COMPREHENSIVE METABOLIC PANEL
ALT: 19 U/L (ref 0–44)
AST: 48 U/L — ABNORMAL HIGH (ref 15–41)
Albumin: 4 g/dL (ref 3.5–5.0)
Alkaline Phosphatase: 125 U/L (ref 38–126)
Anion gap: 12 (ref 5–15)
BUN: 9 mg/dL (ref 6–20)
CO2: 19 mmol/L — ABNORMAL LOW (ref 22–32)
Calcium: 8.2 mg/dL — ABNORMAL LOW (ref 8.9–10.3)
Chloride: 100 mmol/L (ref 98–111)
Creatinine, Ser: 1.1 mg/dL — ABNORMAL HIGH (ref 0.44–1.00)
GFR, Estimated: 59 mL/min — ABNORMAL LOW (ref >60)
Glucose, Bld: 90 mg/dL (ref 70–99)
Potassium: 3.2 mmol/L — ABNORMAL LOW (ref 3.5–5.1)
Sodium: 131 mmol/L — ABNORMAL LOW (ref 135–145)
Total Bilirubin: 1 mg/dL (ref 0.3–1.2)
Total Protein: 7.4 g/dL (ref 6.5–8.1)

## 2021-02-26 LAB — FIBRINOGEN: Fibrinogen: 399 mg/dL (ref 210–475)

## 2021-02-26 LAB — RESP PANEL BY RT-PCR (FLU A&B, COVID) ARPGX2
Influenza A by PCR: NEGATIVE
Influenza B by PCR: NEGATIVE
SARS Coronavirus 2 by RT PCR: POSITIVE — AB

## 2021-02-26 LAB — CBC WITH DIFFERENTIAL/PLATELET
Abs Immature Granulocytes: 0.35 10*3/uL — ABNORMAL HIGH (ref 0.00–0.07)
Basophils Absolute: 0.1 10*3/uL (ref 0.0–0.1)
Basophils Relative: 1 %
Eosinophils Absolute: 0 10*3/uL (ref 0.0–0.5)
Eosinophils Relative: 0 %
HCT: 36.3 % (ref 36.0–46.0)
Hemoglobin: 12.2 g/dL (ref 12.0–15.0)
Immature Granulocytes: 7 %
Lymphocytes Relative: 10 %
Lymphs Abs: 0.5 10*3/uL — ABNORMAL LOW (ref 0.7–4.0)
MCH: 29.5 pg (ref 26.0–34.0)
MCHC: 33.6 g/dL (ref 30.0–36.0)
MCV: 87.9 fL (ref 80.0–100.0)
Monocytes Absolute: 0.3 10*3/uL (ref 0.1–1.0)
Monocytes Relative: 7 %
Neutro Abs: 3.8 10*3/uL (ref 1.7–7.7)
Neutrophils Relative %: 75 %
Platelets: 162 10*3/uL (ref 150–400)
RBC: 4.13 MIL/uL (ref 3.87–5.11)
RDW: 16.6 % — ABNORMAL HIGH (ref 11.5–15.5)
Smear Review: NORMAL
WBC: 5.1 10*3/uL (ref 4.0–10.5)
nRBC: 0 % (ref 0.0–0.2)

## 2021-02-26 LAB — BRAIN NATRIURETIC PEPTIDE
B Natriuretic Peptide: 31.5 pg/mL (ref 0.0–100.0)
B Natriuretic Peptide: 53.4 pg/mL (ref 0.0–100.0)

## 2021-02-26 LAB — URINALYSIS, ROUTINE W REFLEX MICROSCOPIC
Bacteria, UA: NONE SEEN
Glucose, UA: NEGATIVE mg/dL
Hgb urine dipstick: NEGATIVE
Ketones, ur: 40 mg/dL — AB
Leukocytes,Ua: NEGATIVE
Nitrite: NEGATIVE
Specific Gravity, Urine: 1.01 (ref 1.005–1.030)
pH: 6.5 (ref 5.0–8.0)

## 2021-02-26 LAB — C-REACTIVE PROTEIN: CRP: 1.5 mg/dL — ABNORMAL HIGH (ref <1.0)

## 2021-02-26 LAB — LACTATE DEHYDROGENASE: LDH: 278 U/L — ABNORMAL HIGH (ref 98–192)

## 2021-02-26 LAB — PROCALCITONIN: Procalcitonin: <0.1 ng/mL

## 2021-02-26 LAB — TROPONIN I (HIGH SENSITIVITY): Troponin I (High Sensitivity): 7 ng/L (ref <18)

## 2021-02-26 LAB — LIPASE, BLOOD: Lipase: 26 U/L (ref 11–51)

## 2021-02-26 LAB — FERRITIN: Ferritin: 1886 ng/mL — ABNORMAL HIGH (ref 11–307)

## 2021-02-26 LAB — D-DIMER, QUANTITATIVE: D-Dimer, Quant: 1 ug/mL-FEU — ABNORMAL HIGH (ref 0.00–0.50)

## 2021-02-26 LAB — HIV ANTIBODY (ROUTINE TESTING W REFLEX): HIV Screen 4th Generation wRfx: NONREACTIVE

## 2021-02-26 MED ORDER — ADULT MULTIVITAMIN W/MINERALS CH
1.0000 | ORAL_TABLET | Freq: Every day | ORAL | Status: DC
  Administered 2021-02-27 – 2021-03-05 (×7): 1 via ORAL
  Filled 2021-02-26 (×7): qty 1

## 2021-02-26 MED ORDER — LEVOTHYROXINE SODIUM 100 MCG PO TABS
100.0000 ug | ORAL_TABLET | Freq: Every day | ORAL | Status: DC
  Administered 2021-02-27 – 2021-03-01 (×3): 100 ug via ORAL
  Filled 2021-02-26 (×3): qty 1

## 2021-02-26 MED ORDER — SODIUM CHLORIDE 0.9% FLUSH: 3.0000 mL | Freq: Two times a day (BID) | INTRAVENOUS | Status: DC

## 2021-02-26 MED ORDER — ONDANSETRON HCL 4 MG/2ML IJ SOLN
4.0000 mg | Freq: Once | INTRAMUSCULAR | Status: AC
  Administered 2021-02-26: 4 mg via INTRAVENOUS
  Filled 2021-02-26: qty 2

## 2021-02-26 MED ORDER — ACETAMINOPHEN 500 MG PO TABS: 1000.0000 mg | ORAL_TABLET | Freq: Four times a day (QID) | ORAL | Status: DC | PRN

## 2021-02-26 MED ORDER — VITAMIN B-12 1000 MCG PO TABS
1000.0000 ug | ORAL_TABLET | Freq: Every day | ORAL | Status: DC
  Administered 2021-02-27 – 2021-03-05 (×7): 1000 ug via ORAL
  Filled 2021-02-26 (×8): qty 1

## 2021-02-26 MED ORDER — SODIUM CHLORIDE 0.9% FLUSH: 3.0000 mL | INTRAVENOUS | Status: DC | PRN

## 2021-02-26 MED ORDER — IOHEXOL 300 MG/ML  SOLN
100.0000 mL | Freq: Once | INTRAMUSCULAR | Status: AC | PRN
  Administered 2021-02-26: 100 mL via INTRAVENOUS

## 2021-02-26 MED ORDER — HYDROMORPHONE HCL 1 MG/ML IJ SOLN
0.5000 mg | Freq: Once | INTRAMUSCULAR | Status: AC
  Administered 2021-02-26: 0.5 mg via INTRAVENOUS
  Filled 2021-02-26: qty 1

## 2021-02-26 MED ORDER — SODIUM CHLORIDE 0.9 % IV SOLN
100.0000 mg | Freq: Every day | INTRAVENOUS | Status: AC
  Administered 2021-02-27 – 2021-02-28 (×2): 100 mg via INTRAVENOUS
  Filled 2021-02-26 (×2): qty 100

## 2021-02-26 MED ORDER — PIPERACILLIN-TAZOBACTAM 3.375 G IVPB
3.3750 g | Freq: Three times a day (TID) | INTRAVENOUS | Status: DC
  Administered 2021-02-26 – 2021-03-01 (×9): 3.375 g via INTRAVENOUS
  Filled 2021-02-26 (×8): qty 50

## 2021-02-26 MED ORDER — POTASSIUM CHLORIDE IN NACL 20-0.9 MEQ/L-% IV SOLN
INTRAVENOUS | Status: DC
  Filled 2021-02-26 (×10): qty 1000

## 2021-02-26 MED ORDER — TRAMADOL HCL 50 MG PO TABS
50.0000 mg | ORAL_TABLET | Freq: Four times a day (QID) | ORAL | Status: DC | PRN
  Administered 2021-02-26 (×2): 50 mg via ORAL
  Filled 2021-02-26 (×2): qty 1

## 2021-02-26 MED ORDER — ATORVASTATIN CALCIUM 20 MG PO TABS
40.0000 mg | ORAL_TABLET | Freq: Every day | ORAL | Status: DC
Start: 2021-02-27 — End: 2021-03-05
  Administered 2021-02-27 – 2021-03-05 (×7): 40 mg via ORAL
  Filled 2021-02-26 (×7): qty 2

## 2021-02-26 MED ORDER — HYDROMORPHONE HCL 1 MG/ML IJ SOLN
1.0000 mg | INTRAMUSCULAR | Status: DC | PRN
  Administered 2021-02-26 – 2021-02-28 (×8): 1 mg via INTRAVENOUS
  Filled 2021-02-26 (×8): qty 1

## 2021-02-26 MED ORDER — SODIUM CHLORIDE 0.9 % IV SOLN
100.0000 mg | Freq: Two times a day (BID) | INTRAVENOUS | Status: DC
  Administered 2021-02-26: 100 mg via INTRAVENOUS
  Filled 2021-02-26 (×2): qty 100

## 2021-02-26 MED ORDER — FENTANYL CITRATE PF 50 MCG/ML IJ SOSY
50.0000 ug | PREFILLED_SYRINGE | Freq: Once | INTRAMUSCULAR | Status: AC
  Administered 2021-02-26: 50 ug via INTRAVENOUS
  Filled 2021-02-26: qty 1

## 2021-02-26 MED ORDER — FAMOTIDINE 20 MG PO TABS
20.0000 mg | ORAL_TABLET | Freq: Two times a day (BID) | ORAL | Status: DC
  Administered 2021-02-26 – 2021-03-05 (×13): 20 mg via ORAL
  Filled 2021-02-26 (×13): qty 1

## 2021-02-26 MED ORDER — CHLORHEXIDINE GLUCONATE CLOTH 2 % EX PADS
6.0000 | MEDICATED_PAD | Freq: Every day | CUTANEOUS | Status: DC
  Administered 2021-02-27 – 2021-03-05 (×6): 6 via TOPICAL

## 2021-02-26 MED ORDER — FOLIC ACID 1 MG PO TABS
1.0000 mg | ORAL_TABLET | Freq: Every day | ORAL | Status: DC
  Administered 2021-02-26 – 2021-03-05 (×8): 1 mg via ORAL
  Filled 2021-02-26 (×8): qty 1

## 2021-02-26 MED ORDER — SODIUM CHLORIDE 0.9 % IV SOLN
200.0000 mg | Freq: Once | INTRAVENOUS | Status: AC
  Administered 2021-02-26: 200 mg via INTRAVENOUS
  Filled 2021-02-26: qty 200

## 2021-02-26 MED ORDER — SODIUM CHLORIDE 0.9% FLUSH
3.0000 mL | Freq: Two times a day (BID) | INTRAVENOUS | Status: DC
  Administered 2021-02-26 – 2021-03-04 (×9): 3 mL via INTRAVENOUS

## 2021-02-26 MED ORDER — IOHEXOL 350 MG/ML SOLN
60.0000 mL | Freq: Once | INTRAVENOUS | Status: AC | PRN
  Administered 2021-02-26: 60 mL via INTRAVENOUS

## 2021-02-26 MED ORDER — THIAMINE HCL 100 MG PO TABS
100.0000 mg | ORAL_TABLET | Freq: Every day | ORAL | Status: DC
  Administered 2021-02-27 – 2021-03-05 (×7): 100 mg via ORAL
  Filled 2021-02-26 (×7): qty 1

## 2021-02-26 MED ORDER — ENOXAPARIN SODIUM 40 MG/0.4ML IJ SOSY
40.0000 mg | PREFILLED_SYRINGE | INTRAMUSCULAR | Status: DC
  Administered 2021-02-26 – 2021-03-04 (×7): 40 mg via SUBCUTANEOUS
  Filled 2021-02-26 (×7): qty 0.4

## 2021-02-26 MED ORDER — ASCORBIC ACID 500 MG PO TABS
500.0000 mg | ORAL_TABLET | Freq: Every day | ORAL | Status: DC
  Administered 2021-02-26 – 2021-03-05 (×8): 500 mg via ORAL
  Filled 2021-02-26 (×8): qty 1

## 2021-02-26 MED ORDER — SODIUM CHLORIDE 0.9 % IV SOLN: 250.0000 mL | INTRAVENOUS | Status: DC | PRN

## 2021-02-26 MED ORDER — ZINC SULFATE 220 (50 ZN) MG PO CAPS
220.0000 mg | ORAL_CAPSULE | Freq: Every day | ORAL | Status: DC
  Administered 2021-02-26 – 2021-03-05 (×8): 220 mg via ORAL
  Filled 2021-02-26 (×8): qty 1

## 2021-02-26 NOTE — ED Triage Notes (Signed)
Pt reports abd pain since yesterday. Pt reports started intermittently but is now constant. Pt describes the pain as sharp and stabbing. Pt reports has difficult veins and has a port in place due to her difficult veins and wants to wait until she is in a room to have blood collected. Pt reports pain is severe but can only take dilaudid for it.

## 2021-02-26 NOTE — Progress Notes (Signed)
Remdesivir - Pharmacy Brief Note   O:  ALT: 19 CXR: Differential diagnosis includes atypical pneumonia or pulmonary edema SpO2: 96% on RA   A/P:  Remdesivir 200 mg IVPB once followed by 100 mg IVPB daily x 4 days.   Satchel Heidinger Rodriguez-Guzman PharmD, BCPS 02/26/2021 4:17 PM

## 2021-02-26 NOTE — ED Provider Notes (Signed)
[facility] Provider Note    Event Date/Time   First MD Initiated Contact with Patient 02/26/21 [phone]     (approximate)   History   Abdominal Pain   HPI  Adrienne Flores is a 56 y.o. female who complains of a low pain tolerance.  She says that she developed gradual onset of a headache which is now frontal and severe starting yesterday and also developed intermittent abdominal pain which is now constant and severe which is mostly in the lower abdomen.  She has had 3 episodes of diarrhea as well.  She is not having any crampy pain currently.  The pain is deep and steady and again severe.  She is not having any nausea or vomiting but she has not been wanting to eat since yesterday.  She has a history of rheumatoid arthritis.  She has gastroparesis.  Patient has had a bad cough for about a week.  Is nonproductive.  The below has been copied and pasted from the patient's office records. Patient Active Problem List  Diagnosis   DDD (degenerative disc disease), lumbar   RA (rheumatoid arthritis) (CMS-HCC)   Cyclic vomiting syndrome   Acquired hypothyroidism, unspecified   Diverticula, colon   Anxiety   Gastroparesis   Elevated blood pressure   HLD (hyperlipidemia)   ANA positive   Abdominal pain   Iron deficiency anemia due to chronic blood loss   Elevated liver enzymes   CRF (chronic renal failure), stage 3 (moderate) (CMS-HCC)   Arthritis of both knees   Lumbar spondylosis   Intertrigo   Nutritional deficiency   History of arterial ischemic stroke   Outpatient Medications Prior to Visit  Medication Sig Dispense Refill   traMADoL (ULTRAM) 50 mg tablet Take by mouth every 4 (four) hours as needed   aspirin-acetaminophen-caffeine (EXCEDRIN MIGRAINE) 250-250-65 mg per tablet Take by mouth Excedrin PM (Patient not taking: Reported on 11/02/2020)   atorvastatin (LIPITOR) 80 MG tablet Take 1 tablet (80 mg total) by mouth once daily (Patient not taking:  Reported on 11/02/2020) 90 tablet 4   dicyclomine (BENTYL) 20 mg tablet Take 20 mg by mouth once daily (Patient not taking: Reported on 11/02/2020)   DULoxetine (CYMBALTA) 30 MG DR capsule Take 1 capsule (30 mg total) by mouth once daily For 7 days. Then increase to 2 capsules daily and continue (Patient not taking: Reported on 11/02/2020) 60 capsule 4   ergocalciferol, vitamin D2, 1,250 mcg (50,000 unit) capsule Take 1 capsule (50,000 Units total) by mouth once a week for 30 days (Patient not taking: Reported on 11/02/2020) 12 capsule 0   gabapentin (NEURONTIN) 100 MG capsule Take 1 capsule (100 mg total) by mouth 3 (three) times daily (Patient not taking: Reported on 11/02/2020) 90 capsule 11   levothyroxine (SYNTHROID) 150 MCG tablet Take by mouth (Patient not taking: Reported on 11/02/2020)   methylPREDNISolone (MEDROL DOSEPACK) 4 mg tablet Follow package directions. (Patient not taking: Reported on 11/02/2020) 21 tablet 0   pantoprazole (PROTONIX) 40 MG DR tablet Take 1 tablet (40 mg total) by mouth once daily Take 15-20 mins before meal (Patient not taking: Reported on 11/02/2020) 90 tablet 3   promethazine (PHENERGAN) 25 MG tablet 1 tab by mouth every 6 hours as needed (Patient not taking: Reported on 11/02/2020)   QUEtiapine (SEROQUEL) 25 MG tablet Take by mouth (Patient not taking: Reported on 11/02/2020)   tiZANidine (ZANAFLEX) 4 MG tablet Take 1 tablet by mouth as needed (Patient not taking:  Reported on 11/02/2020)   Physical Exam   Triage Vital Signs: ED Triage Vitals  Enc Vitals Group     BP 02/26/21 0810 (!) 138/110     Pulse Rate 02/26/21 0810 (!) 105     Resp 02/26/21 0810 20     Temp 02/26/21 0810 98.2 F (36.8 C)     Temp Source 02/26/21 0810 Oral     SpO2 02/26/21 0810 97 %     Weight 02/26/21 0808 160 lb (72.6 kg)     Height 02/26/21 0808 5\' 1"  (1.549 m)     Head Circumference --      Peak Flow --      Pain Score 02/26/21 0808 10     Pain Loc --      Pain Edu? --      Excl. in  [place]? --     Most recent vital signs: Vitals:   02/26/21 1120 02/26/21 1346  BP: 135/79 (!) 141/83  Pulse: 100 79  Resp: 18 17  Temp: 98.7 F (37.1 C) 98.1 F (36.7 C)  SpO2: 96% 96%     General: Awake, complaining of pain. Head: She has some tenderness over the right mastoid and the right TM is slightly reddened and full looking. Eyes: Pupils are equal round reactive extraocular movements are intact CV:  Good peripheral perfusion.  Heart regular rate and rhythm no audible murmurs Resp:  Normal effort.  Lungs are clear Abd:  No distention.  Abdomen is soft bowel sounds are positive but there is tenderness to palpation and percussion across the lower abdomen.  Patient's husband reports she has been having abdominal pain since Dr. Sharlet Salina took out her gallbladder many years ago.    ED Results / Procedures / Treatments   Labs (all labs ordered are listed, but only abnormal results are displayed) Labs Reviewed  COMPREHENSIVE METABOLIC PANEL - Abnormal; Notable for the following components:      Result Value   Sodium 131 (*)    Potassium 3.2 (*)    CO2 19 (*)    Creatinine, Ser 1.10 (*)    Calcium 8.2 (*)    AST 48 (*)    GFR, Estimated 59 (*)    All other components within normal limits  CBC WITH DIFFERENTIAL/PLATELET - Abnormal; Notable for the following components:   RDW 16.6 (*)    Lymphs Abs 0.5 (*)    Abs Immature Granulocytes 0.35 (*)    All other components within normal limits  GASTROINTESTINAL PANEL BY PCR, STOOL (REPLACES STOOL CULTURE)  C DIFFICILE QUICK SCREEN W PCR REFLEX    RESP PANEL BY RT-PCR (FLU A&B, COVID) [id number]  LIPASE, BLOOD  BRAIN NATRIURETIC PEPTIDE  URINALYSIS, ROUTINE W REFLEX MICROSCOPIC     EKG     RADIOLOGY  Head CT read by radiology reviewed by me shows a lot of fluid in the mastoid air cells and there is also some sinus inflammation with an air-fluid level as well.  CT of the colon read by radiology reviewed by me shows  pancolitis.  Additionally what showed up on the CT scan was a patchy infiltrate in multiple areas of the lungs.  This was borne out on the chest x-ray as well.  PROCEDURES:  Critical Care performed: Critical care time 20 minutes.  This involves reviewing the patient's old records talking to patient and her husband reviewing the studies and lab work and speaking with the hospitalist.  Procedures   MEDICATIONS ORDERED IN ED: Medications  HYDROmorphone (DILAUDID) injection 0.5 mg (0.5 mg Intravenous Given 02/26/21 0946)  ondansetron (ZOFRAN) injection 4 mg (4 mg Intravenous Given 02/26/21 0945)  HYDROmorphone (DILAUDID) injection 0.5 mg (0.5 mg Intravenous Given 02/26/21 1057)  iohexol (OMNIPAQUE) 300 MG/ML solution 100 mL (100 mLs Intravenous Contrast Given 02/26/21 1105)     IMPRESSION / MDM / ASSESSMENT AND PLAN / ED COURSE  I reviewed the triage vital signs and the nursing notes.                              Differential diagnosis includes, but is not limited to COVID or any kind of infectious or inflammatory colitis she could have a viral pneumonia she could have an atypical pneumonia there could be the same germs or different germs in her mastoid area and the ear.  This could be bacterial or viral.    I did review the CTs and chest x-ray myself and also reviewed the labs the patient had today as well as her old laboratory studies.  I have looked at her old notes and in fact got her old past medical history and medicines from those notes.  I am going to speak with the hospitalist and discussed with him observation of this patient with multiple medical problems and whether or not we should give any antibiotics like Zithromax or Levaquin.  Patient does have a husband who can watch her however she has so many problems and looks so biologically old that I think it would be safest to watch her in the hospital.  I discussed her in detail with the hospitalist and he will assume management  momentarily.       FINAL CLINICAL IMPRESSION(S) / ED DIAGNOSES   Final diagnoses:  Generalized abdominal pain  Colitis  Pulmonary infiltrate  Mastoiditis of right side     Rx / DC Orders   ED Discharge Orders     None        Note:  This document was prepared using Dragon voice recognition software and may include unintentional dictation errors.   Nena Polio, MD 02/26/21 [phone]

## 2021-02-26 NOTE — H&P (Addendum)
History and Physical    Adrienne Flores NLZ:[id number] DOB: [birth year] DOA: 02/26/2021  PCP: [name], MD    Patient coming from: Home    Chief Complaint: Abdominal pain, diarrhea, headache mild shortness of breath on exertion  HPI: Adrienne Flores is a 56 y.o. female with medical history significant of anemia, chronic abdominal pain, depression fibromyalgia gastroparesis, hypothyroidism, rheumatoid arthritis porphyria came with a chief complaint of abdominal pain, diarrhea, headache. Patient states had progressive headache, lower abdominal pain 3 episodes of diarrhea, dry cough mild shortness of breath with exertion. Patient has history of chronic abdominal pain, cyclic vomiting syndrome.  The symptoms started 2 days ago.   ED Course:  In the emergency room she had a normal white count, hemoglobin 12.2, sodium 131, potassium 3.2 BUN 9 creatinine 1.1 She was found to be COVID-positive. Saturation was 96% on room air CT abdomen and pelvis showed diffuse colitis C. difficile and GI panel for diarrhea pending CT of the head fluids the right ear and suspicion of mastoiditis CTA chest COVID-pneumonia possible association with bacterial pneumonia  Review of Systems: As per HPI otherwise 10 point review of systems negative.  Shortness of breath, headache, abdominal pain, diarrhea  Past Medical History:  Diagnosis Date   Anemia    At high risk for falls    Chronic abdominal pain 2008   dates back to at least 2008.  multiple ultrasound, CT scans, x rays at [facility], [place].  [facility] colonoscopy, egd unrevealing.     Colitis 2015   ? colitis per CTs in 2015, 2016 and 11/2015.  presumed infectious.  colonoscopy in 2015 negative for colitis.    Constipation    Cyclic vomiting syndrome 2015   per opinion  of Dr Granville Lewis, IBS specialist at [facility].    Degenerative disc disease    Depression    Fibromyalgia    Gastroparesis 03/2011   markedly abnormal gastric emptying  study.    Hematemesis 11/2015   mild to moderate blood in emesis appeared after 2 plus weeks of frequent, non-bloody emesis.    Hemorrhoids    Hemorrhoids    History of chicken pox    Hypokalemia    Hypothyroid    Lupus ([place])    Porphyria ([place])    Rheumatoid arthritis ([place])     Past Surgical History:  Procedure Laterality Date   ABDOMINAL HYSTERECTOMY     total   CESAREAN SECTION     x2   CHOLECYSTECTOMY     COLONOSCOPY  11/2013   at [facility] Dr Kennith Gain. Hemorrhoids. sigmoid tics. random biopsies negative for microscopic colitis or any pathology.    COLONOSCOPY WITH PROPOFOL N/A 05/10/2017   Procedure: COLONOSCOPY WITH PROPOFOL;  Surgeon: Lucilla Lame, MD;  Location: [place];  Service: Endoscopy;  Laterality: N/A;   ESOPHAGOGASTRODUODENOSCOPY  11/2013   Dr Kennith Gain at [facility].  Normal study.  Duodenal bx negative for villous atrophy.    ESOPHAGOGASTRODUODENOSCOPY N/A 12/09/2015   Procedure: ESOPHAGOGASTRODUODENOSCOPY (EGD);  Surgeon: Doran Stabler, MD;  Location: [facility] ENDOSCOPY;  Service: Endoscopy;  Laterality: N/A;   ESOPHAGOGASTRODUODENOSCOPY (EGD) WITH PROPOFOL N/A 05/10/2017   Procedure: ESOPHAGOGASTRODUODENOSCOPY (EGD) WITH PROPOFOL;  Surgeon: Lucilla Lame, MD;  Location: [place];  Service: Endoscopy;  Laterality: N/A;   KNEE SURGERY Left    OOPHORECTOMY     PORTA CATH INSERTION N/A 04/18/2017   Procedure: PORTA CATH INSERTION;  Surgeon: Katha Cabal, MD;  Location: [place] CV LAB;  Service: Cardiovascular;  Laterality: N/A;   PORTA CATH INSERTION Right 05/21/2017   Procedure: PORTA CATH INSERTION;  Surgeon: Katha Cabal, MD;  Location: [place] CV LAB;  Service: Cardiovascular;  Laterality: Right;   REPLACEMENT TOTAL KNEE Bilateral      reports that she has never smoked. She has never used smokeless tobacco. She reports that she does not drink alcohol and does not use drugs.  Allergies  Allergen Reactions   Morphine Hives   Morphine  And Related Hives and Itching    ONLY MORPHINE, tolerates Dilaudid, oxycodone, and Norco    Methotrexate Derivatives Other (See Comments)    Headaches    Nabumetone Other (See Comments)    Headaches   Methotrexate Other (See Comments)    Headaches     Haloperidol Nausea Only and Other (See Comments)    Patient stated that she broke out in cold sweats, also    Family History  Problem Relation Age of Onset   Leukemia Mother    Arthritis Mother    Early death Mother    Miscarriages / [place] Mother    Hypothyroidism Sister    Cancer Paternal Grandmother    Cancer Paternal Grandfather    Hypothyroidism Brother      Prior to Admission medications   Medication Sig Start Date End Date Taking? Authorizing Provider  acetaminophen (TYLENOL) 500 MG tablet Take 2 tablets by mouth every 8 (eight) hours as needed. 12/02/20  Yes [provider]  atorvastatin (LIPITOR) 40 MG tablet Take 1 tablet by mouth daily. 12/02/20  Yes [provider]  Carboxymethylcellul-Glycerin (LUBRICATING EYE DROPS OP) Place 1 drop into both eyes daily as needed (allergies).   Yes [provider]  diphenhydrAMINE-APAP, sleep, 38-500 MG TABS Take 1 tablet by mouth at bedtime as needed (sleep).   Yes [provider]  guaiFENesin (MUCINEX) 600 MG 12 hr tablet Take 1 tablet by mouth in the morning and at bedtime. 02/24/21 03/03/21 Yes [provider]  naproxen sodium (ALEVE) 220 MG tablet Take 220 mg by mouth 2 (two) times daily as needed (pain).   Yes [provider]  tiZANidine (ZANAFLEX) 4 MG tablet Take 4 mg by mouth as needed. 02/15/21  Yes [provider]  traMADol (ULTRAM) 50 MG tablet Take 1 tablet by mouth every 4 (four) hours as needed. 01/12/20  Yes [provider]  benzonatate (TESSALON) 200 MG capsule Take 1 capsule by mouth in the morning, at noon, and at bedtime. Patient not taking: Reported on 02/26/2021 02/24/21 03/03/21  [provider]  dicyclomine (BENTYL) 20 MG tablet Take 1 tablet (20 mg total) by mouth 3 (three) times daily as needed for spasms (abdominal pain). Patient not taking: Reported on 02/26/2021 11/09/17   Kathi Ludwig, MD  famotidine (PEPCID) 20 MG tablet Take 20 mg by mouth 2 (two) times daily. Patient not taking: Reported on 02/26/2021 12/02/20   [provider]  folic acid (FOLVITE) 1 MG tablet Take 1 tablet (1 mg total) by mouth daily. Patient not taking: Reported on 02/26/2021 06/13/17   Max Sane, MD  gabapentin (NEURONTIN) 300 MG capsule Take 300 mg by mouth daily. Patient not taking: Reported on 02/26/2021 12/02/20   [provider]  gabapentin (NEURONTIN) 600 MG tablet Take 600 mg by mouth 2 (two) times daily.  Patient not taking: Reported on 02/26/2021 08/01/10   [provider]  levothyroxine (SYNTHROID, LEVOTHROID) 150 MCG tablet Take 1 tablet (150 mcg total) by mouth daily  before breakfast. Patient taking differently: Take 100 mcg by mouth daily before breakfast. 06/13/17   Max Sane, MD  QUEtiapine (SEROQUEL) 25 MG tablet Take 25 mg by mouth at bedtime. Patient not taking: Reported on 02/26/2021 02/20/21   [provider]  traMADol (ULTRAM) 50 MG tablet Take 1 tablet (50 mg total) by mouth every 6 (six) hours as needed. Patient not taking: Reported on 02/26/2021 07/12/18   Victorino Dike, FNP  vitamin B-12 (CYANOCOBALAMIN) 1000 MCG tablet Take 1 tablet (1,000 mcg total) by mouth daily. Patient not taking: Reported on 02/26/2021 06/12/17   Max Sane, MD    Physical Exam: Vitals:   02/26/21 0808 02/26/21 0810 02/26/21 1120 02/26/21 1346  BP:  (!) 138/110 135/79 (!) 141/83  Pulse:  (!) 105 100 79  Resp:  20 18 17   Temp:  98.2 F (36.8 C) 98.7 F (37.1 C) 98.1 F (36.7 C)  TempSrc:  Oral Oral Oral  SpO2:  97% 96% 96%  Weight: 72.6 kg     Height: 5\' 1"  (1.549 m)       Constitutional: NAD, calm, comfortable Vitals:   02/26/21 0808 02/26/21  0810 02/26/21 1120 02/26/21 1346  BP:  (!) 138/110 135/79 (!) 141/83  Pulse:  (!) 105 100 79  Resp:  20 18 17   Temp:  98.2 F (36.8 C) 98.7 F (37.1 C) 98.1 F (36.7 C)  TempSrc:  Oral Oral Oral  SpO2:  97% 96% 96%  Weight: 72.6 kg     Height: 5\' 1"  (1.549 m)      Eyes: PERRL, lids and conjunctivae normal ENMT: Mucous membranes are moist. Posterior pharynx clear of any exudate or lesions.Normal dentition.  Neck: normal, supple, no masses, no thyromegaly Respiratory: clear to auscultation bilaterally, no wheezing, no crackles. Normal respiratory effort. No accessory muscle use.  Cardiovascular: Regular rate and rhythm, no murmurs / rubs / gallops. No extremity edema. 2+ pedal pulses. No carotid bruits.  Abdomen: no tenderness, no masses palpated. No hepatosplenomegaly. Bowel sounds positive.  Musculoskeletal: no clubbing / cyanosis. No joint deformity upper and lower extremities. Good ROM, no contractures. Normal muscle tone.  Skin: no rashes, lesions, ulcers. No induration Neurologic: CN 2-12 grossly intact. Sensation intact, DTR normal. Strength 5/5 in all 4.  Psychiatric: Normal judgment and insight. Alert and oriented x 3. Normal mood.    Labs on Admission: I have personally reviewed following labs and imaging studies  CBC: Recent Labs  Lab 02/26/21 0906  WBC 5.1  NEUTROABS 3.8  HGB 12.2  HCT 36.3  MCV 87.9  PLT 811   Basic Metabolic Panel: Recent Labs  Lab 02/26/21 0906  NA 131*  K 3.2*  CL 100  CO2 19*  GLUCOSE 90  BUN 9  CREATININE 1.10*  CALCIUM 8.2*   GFR: Estimated Creatinine Clearance: 51.4 mL/min (A) (by C-G formula based on SCr of 1.1 mg/dL (H)). Liver Function Tests: Recent Labs  Lab 02/26/21 0906  AST 48*  ALT 19  ALKPHOS 125  BILITOT 1.0  PROT 7.4  ALBUMIN 4.0   Recent Labs  Lab 02/26/21 0906  LIPASE 26   No results for input(s): AMMONIA in the last 168 hours. Coagulation Profile: No results for input(s): INR, PROTIME in the last  168 hours. Cardiac Enzymes: No results for input(s): CKTOTAL, CKMB, CKMBINDEX, TROPONINI in the last 168 hours. BNP (last 3 results) No results for input(s): PROBNP in the last 8760 hours. HbA1C: No results for input(s): HGBA1C in the last 72  hours. CBG: No results for input(s): GLUCAP in the last 168 hours. Lipid Profile: No results for input(s): CHOL, HDL, LDLCALC, TRIG, CHOLHDL, LDLDIRECT in the last 72 hours. Thyroid Function Tests: No results for input(s): TSH, T4TOTAL, FREET4, T3FREE, THYROIDAB in the last 72 hours. Anemia Panel: No results for input(s): VITAMINB12, FOLATE, FERRITIN, TIBC, IRON, RETICCTPCT in the last 72 hours. Urine analysis:    Component Value Date/Time   COLORURINE YELLOW 11/04/2017 2017   APPEARANCEUR CLEAR 11/04/2017 2017   APPEARANCEUR Cloudy 06/03/2013 0028   LABSPEC 1.013 11/04/2017 2017   LABSPEC 1.008 06/03/2013 0028   PHURINE 8.0 11/04/2017 2017   GLUCOSEU NEGATIVE 11/04/2017 2017   GLUCOSEU Negative 06/03/2013 0028   HGBUR SMALL (A) 11/04/2017 2017   BILIRUBINUR NEGATIVE 11/04/2017 2017   BILIRUBINUR Negative 06/03/2013 0028   KETONESUR 20 (A) 11/04/2017 2017   PROTEINUR NEGATIVE 11/04/2017 2017   UROBILINOGEN 1.0 10/22/2014 1928   NITRITE NEGATIVE 11/04/2017 2017   LEUKOCYTESUR NEGATIVE 11/04/2017 2017   LEUKOCYTESUR 3+ 06/03/2013 0028    Radiological Exams on Admission: CT Head Wo Contrast  Result Date: 02/26/2021 CLINICAL DATA:  56 year old female with history of headache. EXAM: CT HEAD WITHOUT CONTRAST TECHNIQUE: Contiguous axial images were obtained from the base of the skull through the vertex without intravenous contrast. RADIATION DOSE REDUCTION: This exam was performed according to the departmental dose-optimization program which includes automated exposure control, adjustment of the mA and/or kV according to patient size and/or use of iterative reconstruction technique. COMPARISON:  Head CT 04/06/2011. FINDINGS: Brain: No evidence  of acute infarction, hemorrhage, hydrocephalus, extra-axial collection or mass lesion/mass effect. Vascular: No hyperdense vessel or unexpected calcification. Skull: Normal. Negative for fracture or focal lesion. Sinuses/Orbits: No acute finding. Other: Fluid in the right middle ear.  Large right mastoid effusion. IMPRESSION: 1. Fluid in the right middle ear and large right mastoid effusion. Clinical correlation for signs and symptoms of otitis media and/or mastoiditis. 2. No other acute intracranial abnormalities. Electronically Signed   By: Vinnie Langton M.D.   On: 02/26/2021 11:32   CT ABDOMEN PELVIS W CONTRAST  Result Date: 02/26/2021 CLINICAL DATA:  Acute abdominal pain beginning yesterday. EXAM: CT ABDOMEN AND PELVIS WITH CONTRAST TECHNIQUE: Multidetector CT imaging of the abdomen and pelvis was performed using the standard protocol following bolus administration of intravenous contrast. RADIATION DOSE REDUCTION: This exam was performed according to the departmental dose-optimization program which includes automated exposure control, adjustment of the mA and/or kV according to patient size and/or use of iterative reconstruction technique. CONTRAST:  153mL OMNIPAQUE IOHEXOL 300 MG/ML  SOLN COMPARISON:  Noncontrast CT on 11/04/2017 FINDINGS: Lower Chest: Multiple small focal areas of ground-glass opacity in the left lower lobe, and larger ill-defined ground-glass opacity in the right perihilar region, likely infectious or inflammatory etiology. Hepatobiliary: No hepatic masses identified. Prior cholecystectomy. No evidence of biliary obstruction. Pancreas:  No mass or inflammatory changes. Spleen: Within normal limits in size and appearance. Adrenals/Urinary Tract: No masses identified. Stable mild right renal atrophy and scarring. No evidence of ureteral calculi or hydronephrosis. Stomach/Bowel: Mild-to-moderate colonic wall thickening throughout the ascending, transverse, and descending colon,  consistent with colitis. No evidence of small bowel involvement. No evidence of abscess or free fluid. Vascular/Lymphatic: No pathologically enlarged lymph nodes. No acute vascular findings. Aortic atherosclerotic calcification noted. Reproductive: Prior hysterectomy noted. Adnexal regions are unremarkable in appearance. Other:  None. Musculoskeletal:  No suspicious bone lesions identified. IMPRESSION: Mild-to-moderate colitis involving the ascending, transverse, and descending colon. No evidence of  abscess or other complication. Multifocal areas of ground-glass opacity visualized portions of both lungs, likely infectious or inflammatory etiology. Recommend clinical correlation, and consider follow-up by chest CT. Aortic Atherosclerosis (ICD10-I70.0). Electronically Signed   By: Marlaine Hind M.D.   On: 02/26/2021 11:39   DG Chest Portable 1 View  Result Date: 02/26/2021 CLINICAL DATA:  Inflammatory lung disease seen on CT. EXAM: PORTABLE CHEST 1 VIEW COMPARISON:  June 08, 2017 FINDINGS: Injectable port in stable position. Cardiomediastinal silhouette is normal. Mediastinal contours appear intact. Ground-glass and nodular subtle airspace consolidation with lower lobe predominance throughout both lungs. Osseous structures are without acute abnormality. Soft tissues are grossly normal. IMPRESSION: Ground-glass and nodular subtle airspace consolidation with lower lobe predominance throughout both lungs. Differential diagnosis includes atypical pneumonia or pulmonary edema. Electronically Signed   By: Fidela Salisbury M.D.   On: 02/26/2021 12:48    EKG: Independently reviewed.  Pending  Assessment/Plan Principal Problem:   COVID-19 virus infection Active Problems:   Hypothyroidism   Colitis   Pneumonia due to COVID-19 virus   Mastoiditis   Headache    COVID-19 positive test (U07.1, COVID-19) with Acute Pneumonia (J12.89, Other viral pneumonia) (If respiratory failure or sepsis present, add as  separate assessment) Mild shortness of breath Saturation 96% on room air Plan remdesivir, Decadron not indicated not hypoxic Inflammatory markers pending procalcitonin pending Multivitamins zinc vitamin D CTA rule out PE, D-dimer pending, patient states some shortness of breath with exertion  Pancolitis CT abdomen and pelvis positive for diffuse colitis Abdominal pain, diarrhea C. difficile pending GI panel pending Started empirically on Zosyn  Mastoiditis Right ear fluid Severe headache Tylenol as needed Zosyn ENT consult  Severe headache I discussed with the emergency room physician Suspicion of meningitis. No indication at this point for LP Possible secondary to mastoiditis Patient received several doses of Dilaudid and fentanyl still complaining of pain  Hypothyroidism Resume Synthroid  Chronic abdominal pain, fibromyalgia  Concern about hyper use of narcotics Patient received several doses of Dilaudid and fentanyl and still in severe pain      DVT prophylaxis: Lovenox Code Status: Full code Family Communication: Husband in the room Disposition Plan: Home Consults called: None Admission status: Inpatient   Erskine Steinfeldt G Malcolm Hetz MD [facility]  If 7PM-7AM, please contact night-coverage [email or web address]   02/26/2021, 4:07 PM

## 2021-02-26 NOTE — ED Notes (Signed)
Request made for transport to the floor ?

## 2021-02-27 LAB — CBC WITH DIFFERENTIAL/PLATELET
Abs Immature Granulocytes: 0.45 10*3/uL — ABNORMAL HIGH (ref 0.00–0.07)
Basophils Absolute: 0.1 10*3/uL (ref 0.0–0.1)
Basophils Relative: 2 %
Eosinophils Absolute: 0 10*3/uL (ref 0.0–0.5)
Eosinophils Relative: 0 %
HCT: 33.2 % — ABNORMAL LOW (ref 36.0–46.0)
Hemoglobin: 11.4 g/dL — ABNORMAL LOW (ref 12.0–15.0)
Immature Granulocytes: 10 %
Lymphocytes Relative: 12 %
Lymphs Abs: 0.5 10*3/uL — ABNORMAL LOW (ref 0.7–4.0)
MCH: 30.2 pg (ref 26.0–34.0)
MCHC: 34.3 g/dL (ref 30.0–36.0)
MCV: 88.1 fL (ref 80.0–100.0)
Monocytes Absolute: 0.4 10*3/uL (ref 0.1–1.0)
Monocytes Relative: 8 %
Neutro Abs: 3.1 10*3/uL (ref 1.7–7.7)
Neutrophils Relative %: 68 %
Platelets: 174 10*3/uL (ref 150–400)
RBC: 3.77 MIL/uL — ABNORMAL LOW (ref 3.87–5.11)
RDW: 16.4 % — ABNORMAL HIGH (ref 11.5–15.5)
Smear Review: NORMAL
WBC: 4.5 10*3/uL (ref 4.0–10.5)
nRBC: 0 % (ref 0.0–0.2)

## 2021-02-27 LAB — COMPREHENSIVE METABOLIC PANEL
ALT: 17 U/L (ref 0–44)
AST: 49 U/L — ABNORMAL HIGH (ref 15–41)
Albumin: 3.8 g/dL (ref 3.5–5.0)
Alkaline Phosphatase: 115 U/L (ref 38–126)
Anion gap: 11 (ref 5–15)
BUN: 10 mg/dL (ref 6–20)
CO2: 22 mmol/L (ref 22–32)
Calcium: 7.8 mg/dL — ABNORMAL LOW (ref 8.9–10.3)
Chloride: 103 mmol/L (ref 98–111)
Creatinine, Ser: 1.34 mg/dL — ABNORMAL HIGH (ref 0.44–1.00)
GFR, Estimated: 46 mL/min — ABNORMAL LOW (ref >60)
Glucose, Bld: 100 mg/dL — ABNORMAL HIGH (ref 70–99)
Potassium: 3.4 mmol/L — ABNORMAL LOW (ref 3.5–5.1)
Sodium: 136 mmol/L (ref 135–145)
Total Bilirubin: 1 mg/dL (ref 0.3–1.2)
Total Protein: 7 g/dL (ref 6.5–8.1)

## 2021-02-27 LAB — BLOOD CULTURE ID PANEL (REFLEXED) - BCID2

## 2021-02-27 MED ORDER — OXYCODONE HCL 5 MG PO TABS
5.0000 mg | ORAL_TABLET | ORAL | Status: DC | PRN
  Administered 2021-02-27 – 2021-02-28 (×2): 5 mg via ORAL
  Filled 2021-02-27 (×2): qty 1

## 2021-02-27 MED ORDER — VANCOMYCIN HCL 1500 MG/300ML IV SOLN
1500.0000 mg | Freq: Once | INTRAVENOUS | Status: AC
  Administered 2021-02-27: 1500 mg via INTRAVENOUS
  Filled 2021-02-27: qty 300

## 2021-02-27 MED ORDER — VANCOMYCIN HCL IN DEXTROSE 1-5 GM/200ML-% IV SOLN
1000.0000 mg | INTRAVENOUS | Status: DC
  Filled 2021-02-27: qty 200

## 2021-02-27 MED ORDER — HYDROMORPHONE HCL 1 MG/ML IJ SOLN
1.0000 mg | Freq: Once | INTRAMUSCULAR | Status: AC
  Administered 2021-02-27: 1 mg via INTRAVENOUS
  Filled 2021-02-27: qty 1

## 2021-02-27 NOTE — Progress Notes (Signed)
Pharmacy Antibiotic Note  Adrienne Flores is a 56 y.o. female admitted on 02/26/2021 with bacteremia.  Pharmacy has been consulted for vancomycin dosing. Patient presented with abdominal pain, headache and diarrhea.  She has a port due to being hard stick, appears port place in Nov 2017. She has rheumatoid arthritis but has not seen specialist in years (do not see that she is on immunomodulatory therapy).  She has PMH of chronic renal failure as well  COVID - Positive 1/15 Bcx: 4/4 bottles GPC, BCID Staphylococcus species 1/16 Bcx - to be drawn C difficle and GI pending ordered (not collected)  Blood cultures from [facility]  10/16 S. 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  Give vancomycin 1500mg  IV x 1 after blood cultures drawn then 1000mg  IV q24h - Dose based on patient receiving 1250mg  IV q24h at [facility] in Oct with at VT = 18 w/ SCr 1.23 Estimated AUC 573, est Cmin 12.1 using SCr 16.1 Goal AUC 400-600 Continue piperacillin/tazobactam as ordered Monitor renal function closely. Check levels if remains on vanco >4 days or PRN for changes in renal function Follow-up identification of 1/61 blood cx and result of blood cx drawn today  Height: 5\' 1"  (154.9 cm) Weight: 72.6 kg (160 lb) IBW/kg (Calculated) : 47.8  Temp (24hrs), Avg:98.3 F (36.8 C), Min:97.7 F (36.5 C), Max:98.7 F (37.1 C)  Recent Labs  Lab 02/26/21 0906 02/27/21 0615  WBC 5.1 4.5  CREATININE 1.10* 1.34*    Estimated Creatinine Clearance: 42.2 mL/min (A) (by C-G formula based on SCr of 1.34 mg/dL (H)).    Allergies  Allergen Reactions   Morphine Hives   Morphine And Related Hives and Itching    ONLY MORPHINE, tolerates Dilaudid, oxycodone, and Norco    Methotrexate  Derivatives Other (See Comments)    Headaches    Nabumetone Other (See Comments)    Headaches   Methotrexate Other (See Comments)    Headaches     Haloperidol Nausea Only and Other (See Comments)    Patient stated that she broke out in cold sweats, also    Antimicrobials this admission: 1/15 doxy x1 1/15 pip/tazo >> 1/16 vanco >>  Dose adjustments this admission:   Microbiology results: 1/16 Bcx: 4/4 GPC, BCID staph spp  Thank you for allowing pharmacy to be a part of this patients care. Doreene Eland, PharmD, BCPS, BCIDP Work Cell: [phone] 02/27/2021 9:48 AM

## 2021-02-27 NOTE — TOC Initial Note (Signed)
Transition of Care [facility]) - Initial/Assessment Note    Patient Details  Name: Adrienne Flores MRN: [id number] Date of Birth: 1966/02/01  Transition of [facility]) CM/SW Contact:    Kerin Salen, RN Phone Number: 02/27/2021, 3:37 PM  Clinical Narrative:  Transition of Care [facility]) Screening Note   Patient Details  Name: Adrienne Flores Date of Birth: 07-16-1965   Transition of Care [facility]) CM/SW Contact:    Kerin Salen, RN Phone Number: 02/27/2021, 3:37 PM    Transition of Care Department [facility]) has reviewed patient and no TOC needs have been identified at this time. We will continue to monitor patient advancement through interdisciplinary progression rounds. If new patient transition needs arise, please place a TOC consult.                          Patient Goals and CMS Choice        Expected Discharge Plan and Services                                                Prior Living Arrangements/Services                       Activities of Daily Living Home Assistive Devices/Equipment: Other (Comment) (pt states holding bars along walls) ADL Screening (condition at time of admission) Patient's cognitive ability adequate to safely complete daily activities?: Yes Is the patient deaf or have difficulty hearing?: Yes Does the patient have difficulty seeing, even when wearing glasses/contacts?: No Does the patient have difficulty concentrating, remembering, or making decisions?: No Patient able to express need for assistance with ADLs?: Yes Does the patient have difficulty dressing or bathing?: No Independently performs ADLs?: Yes (appropriate for developmental age) Does the patient have difficulty walking or climbing stairs?: Yes Weakness of Legs: Both Weakness of Arms/Hands: None  Permission Sought/Granted                  Emotional Assessment              Admission diagnosis:  Colitis [K52.9] Pulmonary infiltrate  [R91.8] Generalized abdominal pain [R10.84] Mastoiditis of right side [H70.91] COVID-19 virus infection [U07.1] Patient Active Problem List   Diagnosis Date Noted   Pneumonia due to COVID-19 virus 02/26/2021   Mastoiditis 02/26/2021   Headache 02/26/2021   COVID-19 virus infection 02/26/2021   Respiratory alkalosis 11/05/2017   Hypotension 06/08/2017   Hypokalemia 06/08/2017   Mechanical complication of vascular device 05/16/2017   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   Loss of weight    Abdominal pain, generalized    Gastritis without bleeding    Iron deficiency anemia 07/02/2016   Disoriented to time 01/24/2016   Protein-calorie malnutrition, severe 12/12/2015   Symptomatic anemia 12/09/2015   Acute hyponatremia 12/09/2015   Hematemesis 12/09/2015   Chronic hoarseness 12/09/2015   Unintended weight loss 12/09/2015   H/O medication noncompliance 12/07/2015   Chronic lower back pain 08/30/2014   Rheu arthrit of right ank/ft w involv of organs and systems ([place]) 06/16/2014   DDD (degenerative disc disease), cervical 06/16/2014   DDD (degenerative disc disease), thoracic 06/16/2014   DDD (degenerative disc disease), lumbosacral 06/16/2014   Diarrhea 08/22/2013   Hypokalemia due to inadequate potassium intake 08/22/2013   Chronic abdominal and back  pain 07/26/2013  Nausea and vomiting 07/26/2013   Porphyria ([place]) 07/26/2013   Rheumatoid arthritis ([place]) 07/26/2013   Acute on chronic renal failure ([place]) 06/27/2013   AKI (acute kidney injury) ([place]) 06/25/2013   Transaminitis 06/25/2013   Gastroparesis 11/26/2012   Hypothyroidism 11/26/2012   Hyperlipidemia 11/03/2012   Lightheadedness 10/26/2012   Other specified abnormal immunological findings in serum 10/01/2012   Colitis 01/19/2012   Primary localized osteoarthrosis of hand 71/95/9747   Cyclic vomiting syndrome 09/14/2010   Mixed urge and stress incontinence 08/01/2010   Depressive disorder 12/20/2009   PCP:  [name],  MD Pharmacy:   [facility] [place], [place] [address] AT [address] [place] [address] Phone: [phone] Fax: [phone]     Social Determinants of Health (SDOH) Interventions    Readmission Risk Interventions No flowsheet data found.

## 2021-02-27 NOTE — Progress Notes (Signed)
PHARMACY - PHYSICIAN COMMUNICATION CRITICAL VALUE ALERT - BLOOD CULTURE IDENTIFICATION (BCID)  Adrienne Flores is an 56 y.o. female who presented to [facility] on 02/26/2021 with a chief complaint of abdominal pain, diarrhea, headache  Assessment:  Blood culture from 1/15 with GPC in 4 of 4 bottles, BCID = staphylococcus species (NOT S aureu or epidermidis).  Methicillin resistance (ie MEC-A) gene is not tested in these cases.  She a port, 1 set of cultures from peripheral site other from [place]. Also treating diffuse colitis per CT and mastoiditis per CT head  Name of physician (or Provider) Contacted: Dr Priscella Mann  Current antibiotics: Doxycyline and piperacillin/tazobactam  Changes to prescribed antibiotics recommended:  Recommendations accepted by provider - doxy to vancomycin pending final identification of staphylococcus species  Results for orders placed or performed during the hospital encounter of 02/26/21  Blood Culture ID Panel (Reflexed) (Collected: 02/26/2021  4:13 PM)  Result Value Ref Range   Enterococcus faecalis NOT DETECTED NOT DETECTED   Enterococcus Faecium NOT DETECTED NOT DETECTED   Listeria monocytogenes NOT DETECTED NOT DETECTED   Staphylococcus species DETECTED (A) NOT DETECTED   Staphylococcus aureus (BCID) NOT DETECTED NOT DETECTED   Staphylococcus epidermidis NOT DETECTED NOT DETECTED   Staphylococcus lugdunensis NOT DETECTED NOT DETECTED   Streptococcus species NOT DETECTED NOT DETECTED   Streptococcus agalactiae NOT DETECTED NOT DETECTED   Streptococcus pneumoniae NOT DETECTED NOT DETECTED   Streptococcus pyogenes NOT DETECTED NOT DETECTED   A.calcoaceticus-baumannii NOT DETECTED NOT DETECTED   Bacteroides fragilis NOT DETECTED NOT DETECTED   Enterobacterales NOT DETECTED NOT DETECTED   Enterobacter cloacae complex NOT DETECTED NOT DETECTED   Escherichia coli NOT DETECTED NOT DETECTED   Klebsiella aerogenes NOT DETECTED NOT DETECTED   Klebsiella oxytoca  NOT DETECTED NOT DETECTED   Klebsiella pneumoniae NOT DETECTED NOT DETECTED   Proteus species NOT DETECTED NOT DETECTED   Salmonella species NOT DETECTED NOT DETECTED   Serratia marcescens NOT DETECTED NOT DETECTED   Haemophilus influenzae NOT DETECTED NOT DETECTED   Neisseria meningitidis NOT DETECTED NOT DETECTED   Pseudomonas aeruginosa NOT DETECTED NOT DETECTED   Stenotrophomonas maltophilia NOT DETECTED NOT DETECTED   Candida albicans NOT DETECTED NOT DETECTED   Candida auris NOT DETECTED NOT DETECTED   Candida glabrata NOT DETECTED NOT DETECTED   Candida krusei NOT DETECTED NOT DETECTED   Candida parapsilosis NOT DETECTED NOT DETECTED   Candida tropicalis NOT DETECTED NOT DETECTED   Cryptococcus neoformans/gattii NOT DETECTED NOT DETECTED    Doreene Eland, PharmD, BCPS, BCIDP Work Cell: [phone] 02/27/2021 9:28 AM

## 2021-02-27 NOTE — Progress Notes (Signed)
PROGRESS NOTE    Adrienne Flores  ZOX:[id number] DOB: [birth year] DOA: 02/26/2021 PCP: [name], MD    Brief Narrative:  56 y.o. female with medical history significant of anemia, chronic abdominal pain, depression fibromyalgia gastroparesis, hypothyroidism, rheumatoid arthritis porphyria came with a chief complaint of abdominal pain, diarrhea, headache. Patient states had progressive headache, lower abdominal pain 3 episodes of diarrhea, dry cough mild shortness of breath with exertion. Patient has history of chronic abdominal pain, cyclic vomiting syndrome.  The symptoms started 2 days ago.  Imaging significant for right ear otitis media and possible mastoiditis.  CTA chest negative for PE, consistent with possible COVID-pneumonia/bacterial pneumonia.  CT abdomen with diffuse colitis   Assessment & Plan:   Principal Problem:   COVID-19 virus infection Active Problems:   Hypothyroidism   Colitis   Pneumonia due to COVID-19 virus   Mastoiditis   Headache  COVID-19 infection Acute pneumonia Bilateral infiltrates on CT chest Unclear whether this represents a bacterial versus viral pneumonia Also possible inflammatory changes Plan: IV remdesivir Broad-spectrum antibiotics as below  Pancolitis CT abdomen pelvis positive for diffuse colitis Associated abdominal pain and diarrhea C. difficile and GI PCR pending Plan: Empiric Zosyn Monitor vitals and fever curve Pain control  Mastoiditis Headache Headache seems improved No indication for LP at this point Plan: Empiric antibiotics Follow cultures Monitor vitals and fever curve As needed headache treatment  Hypothyroidism PTA Synthroid  Chronic abdominal pain Fibromyalgia As needed narcotics Will caution patient about continued use of opiates   DVT prophylaxis: SQ Lovenox Code Status: Full Family Communication: Husband at bedside 1/16 Disposition Plan: Status is: Inpatient  Remains inpatient  appropriate because: Multiple acute issues.  COVID infection, multifocal pneumonia, mastoiditis, pancolitis.  On IV antibiotics.  Also possible bacteremia.  Disposition plan pending.       Level of care: Med-Surg  Consultants:  None  Procedures:  None  Antimicrobials: Vancomycin Zosyn   Subjective: Patient seen and examined.  Tearful.  States she does very poorly with IV sticks.  Phlebotomist in the room drawing blood cultures.  Reports lower abdominal pain.  Objective: Vitals:   02/26/21 2212 02/27/21 0552 02/27/21 0900 02/27/21 1140  BP: 119/82 104/82 97/78 105/76  Pulse: (!) 108 89 87 76  Resp: 16  18 18   Temp: 98.5 F (36.9 C) 98.2 F (36.8 C) 97.7 F (36.5 C) 98.3 F (36.8 C)  TempSrc: Oral Oral Oral Oral  SpO2: 93% 92% 96% 95%  Weight:      Height:        Intake/Output Summary (Last 24 hours) at 02/27/2021 1146 Last data filed at 02/27/2021 0543 Gross per 24 hour  Intake 1217.34 ml  Output --  Net 1217.34 ml   Filed Weights   02/26/21 0808  Weight: 72.6 kg    Examination:  General exam: Mild distress due to pain and anxiety regarding IV stick Respiratory system: Scattered crackles.  Normal work of breathing.  Room air Cardiovascular system: S1-S2, RRR, no murmurs, no pedal edema Gastrointestinal system: Soft, nondistended, tender to palpation lower abdomen, normal bowel sounds Central nervous system: Alert and oriented. No focal neurological deficits. Extremities: Symmetric 5 x 5 power. Skin: Scattered excoriations bilateral upper extremities and chest Psychiatry: Judgement and insight appear normal. Mood & affect appropriate.     Data Reviewed: I have personally reviewed following labs and imaging studies  CBC: Recent Labs  Lab 02/26/21 0906 02/27/21 0615  WBC 5.1 4.5  NEUTROABS 3.8 3.1  HGB 12.2 11.4*  HCT 36.3 33.2*  MCV 87.9 88.1  PLT 162 568   Basic Metabolic Panel: Recent Labs  Lab 02/26/21 0906 02/27/21 0615  NA 131* 136   K 3.2* 3.4*  CL 100 103  CO2 19* 22  GLUCOSE 90 100*  BUN 9 10  CREATININE 1.10* 1.34*  CALCIUM 8.2* 7.8*   GFR: Estimated Creatinine Clearance: 42.2 mL/min (A) (by C-G formula based on SCr of 1.34 mg/dL (H)). Liver Function Tests: Recent Labs  Lab 02/26/21 0906 02/27/21 0615  AST 48* 49*  ALT 19 17  ALKPHOS 125 115  BILITOT 1.0 1.0  PROT 7.4 7.0  ALBUMIN 4.0 3.8   Recent Labs  Lab 02/26/21 0906  LIPASE 26   No results for input(s): AMMONIA in the last 168 hours. Coagulation Profile: No results for input(s): INR, PROTIME in the last 168 hours. Cardiac Enzymes: No results for input(s): CKTOTAL, CKMB, CKMBINDEX, TROPONINI in the last 168 hours. BNP (last 3 results) No results for input(s): PROBNP in the last 8760 hours. HbA1C: No results for input(s): HGBA1C in the last 72 hours. CBG: No results for input(s): GLUCAP in the last 168 hours. Lipid Profile: No results for input(s): CHOL, HDL, LDLCALC, TRIG, CHOLHDL, LDLDIRECT in the last 72 hours. Thyroid Function Tests: No results for input(s): TSH, T4TOTAL, FREET4, T3FREE, THYROIDAB in the last 72 hours. Anemia Panel: Recent Labs    02/26/21 1613  FERRITIN 1,886*   Sepsis Labs: Recent Labs  Lab 02/26/21 1613  PROCALCITON <0.10    Recent Results (from the past 240 hour(s))  Resp Panel by RT-PCR (Flu A&B, Covid) Nasopharyngeal Swab     Status: Abnormal   Collection Time: 02/26/21  1:20 PM   Specimen: Nasopharyngeal Swab; Nasopharyngeal(NP) swabs in vial transport medium  Result Value Ref Range Status   SARS Coronavirus 2 by RT PCR POSITIVE (A) NEGATIVE Final    Comment: (NOTE) SARS-CoV-2 target nucleic acids are DETECTED.  The SARS-CoV-2 RNA is generally detectable in upper respiratory specimens during the acute phase of infection. Positive results are indicative of the presence of the identified virus, but do not rule out bacterial infection or co-infection with other pathogens not detected by the  test. Clinical correlation with patient history and other diagnostic information is necessary to determine patient infection status. The expected result is Negative.  Fact Sheet for Patients: EntrepreneurPulse.com.au  Fact Sheet for Healthcare Providers: IncredibleEmployment.be  This test is not yet approved or cleared by the [place] FDA and  has been authorized for detection and/or diagnosis of SARS-CoV-2 by FDA under an Emergency Use Authorization (EUA).  This EUA will remain in effect (meaning this test can be used) for the duration of  the COVID-19 declaration under Section 564(b)(1) of the A ct, 21 U.S.C. section 360bbb-3(b)(1), unless the authorization is terminated or revoked sooner.     Influenza A by PCR NEGATIVE NEGATIVE Final   Influenza B by PCR NEGATIVE NEGATIVE Final    Comment: (NOTE) The Xpert Xpress SARS-CoV-2/FLU/RSV plus assay is intended as an aid in the diagnosis of influenza from Nasopharyngeal swab specimens and should not be used as a sole basis for treatment. Nasal washings and aspirates are unacceptable for Xpert Xpress SARS-CoV-2/FLU/RSV testing.  Fact Sheet for Patients: EntrepreneurPulse.com.au  Fact Sheet for Healthcare Providers: IncredibleEmployment.be  This test is not yet approved or cleared by the [place] FDA and has been authorized for detection and/or diagnosis of SARS-CoV-2 by FDA under an Emergency Use Authorization (EUA). This EUA will remain  in effect (meaning this test can be used) for the duration of the COVID-19 declaration under Section 564(b)(1) of the Act, 21 U.S.C. section 360bbb-3(b)(1), unless the authorization is terminated or revoked.  Performed at [facility], [address] [place], [place] [address]   Culture, blood (Routine X 2) w Reflex to ID Panel     Status: None (Preliminary result)   Collection Time: 02/26/21  4:13 PM    Specimen: BLOOD  Result Value Ref Range Status   Specimen Description BLOOD RIGHT HAND  Final   Special Requests   Final    BOTTLES DRAWN AEROBIC AND ANAEROBIC Blood Culture adequate volume   Culture  Setup Time   Final    GRAM POSITIVE COCCI IN BOTH AEROBIC AND ANAEROBIC BOTTLES CRITICAL VALUE NOTED.  VALUE IS CONSISTENT WITH PREVIOUSLY REPORTED AND CALLED VALUE. Performed at [facility], [address] [place], [place] [address]    Culture [facility] POSITIVE COCCI  Final   Report Status PENDING  Incomplete  Culture, blood (Routine X 2) w Reflex to ID Panel     Status: None (Preliminary result)   Collection Time: 02/26/21  4:13 PM   Specimen: BLOOD  Result Value Ref Range Status   Specimen Description BLOOD PORT  Final   Special Requests   Final    BOTTLES DRAWN AEROBIC AND ANAEROBIC Blood Culture adequate volume   Culture  Setup Time   Final    GRAM POSITIVE COCCI IN BOTH AEROBIC AND ANAEROBIC BOTTLES Organism ID to follow CRITICAL RESULT CALLED TO, READ BACK BY AND VERIFIED WITH: Paulina Fusi KGURKY [id number] 02/27/21 HNM Performed at [facility], [address] [place], [place] [address]    Culture GRAM POSITIVE COCCI  Final   Report Status PENDING  Incomplete  Blood Culture ID Panel (Reflexed)     Status: Abnormal   Collection Time: 02/26/21  4:13 PM  Result Value Ref Range Status   Enterococcus faecalis NOT DETECTED NOT DETECTED Final   Enterococcus Faecium NOT DETECTED NOT DETECTED Final   Listeria monocytogenes NOT DETECTED NOT DETECTED Final   Staphylococcus species DETECTED (A) NOT DETECTED Final    Comment: CRITICAL RESULT CALLED TO, READ BACK BY AND VERIFIED WITH: Paulina Fusi PHARMD [id number] 02/27/21 HNM    Staphylococcus aureus (BCID) NOT DETECTED NOT DETECTED Final   Staphylococcus epidermidis NOT DETECTED NOT DETECTED Final   Staphylococcus lugdunensis NOT DETECTED NOT DETECTED Final   Streptococcus species NOT DETECTED NOT DETECTED Final    Streptococcus agalactiae NOT DETECTED NOT DETECTED Final   Streptococcus pneumoniae NOT DETECTED NOT DETECTED Final   Streptococcus pyogenes NOT DETECTED NOT DETECTED Final   A.calcoaceticus-baumannii NOT DETECTED NOT DETECTED Final   Bacteroides fragilis NOT DETECTED NOT DETECTED Final   Enterobacterales NOT DETECTED NOT DETECTED Final   Enterobacter cloacae complex NOT DETECTED NOT DETECTED Final   Escherichia coli NOT DETECTED NOT DETECTED Final   Klebsiella aerogenes NOT DETECTED NOT DETECTED Final   Klebsiella oxytoca NOT DETECTED NOT DETECTED Final   Klebsiella pneumoniae NOT DETECTED NOT DETECTED Final   Proteus species NOT DETECTED NOT DETECTED Final   Salmonella species NOT DETECTED NOT DETECTED Final   Serratia marcescens NOT DETECTED NOT DETECTED Final   Haemophilus influenzae NOT DETECTED NOT DETECTED Final   Neisseria meningitidis NOT DETECTED NOT DETECTED Final   Pseudomonas aeruginosa NOT DETECTED NOT DETECTED Final   Stenotrophomonas maltophilia NOT DETECTED NOT DETECTED Final   Candida albicans NOT DETECTED NOT DETECTED Final  Candida auris NOT DETECTED NOT DETECTED Final   Candida glabrata NOT DETECTED NOT DETECTED Final   Candida krusei NOT DETECTED NOT DETECTED Final   Candida parapsilosis NOT DETECTED NOT DETECTED Final   Candida tropicalis NOT DETECTED NOT DETECTED Final   Cryptococcus neoformans/gattii NOT DETECTED NOT DETECTED Final    Comment: Performed at [facility], [address] [place], [place] [address]         Radiology Studies: CT Head Wo Contrast  Result Date: 02/26/2021 CLINICAL DATA:  56 year old female with history of headache. EXAM: CT HEAD WITHOUT CONTRAST TECHNIQUE: Contiguous axial images were obtained from the base of the skull through the vertex without intravenous contrast. RADIATION DOSE REDUCTION: This exam was performed according to the departmental dose-optimization program which includes automated exposure control,  adjustment of the mA and/or kV according to patient size and/or use of iterative reconstruction technique. COMPARISON:  Head CT 04/06/2011. FINDINGS: Brain: No evidence of acute infarction, hemorrhage, hydrocephalus, extra-axial collection or mass lesion/mass effect. Vascular: No hyperdense vessel or unexpected calcification. Skull: Normal. Negative for fracture or focal lesion. Sinuses/Orbits: No acute finding. Other: Fluid in the right middle ear.  Large right mastoid effusion. IMPRESSION: 1. Fluid in the right middle ear and large right mastoid effusion. Clinical correlation for signs and symptoms of otitis media and/or mastoiditis. 2. No other acute intracranial abnormalities. Electronically Signed   By: Vinnie Langton M.D.   On: 02/26/2021 11:32   CT Angio Chest Pulmonary Embolism (PE) W or WO Contrast  Result Date: 02/26/2021 CLINICAL DATA:  Chest pain, shortness of breath EXAM: CT ANGIOGRAPHY CHEST WITH CONTRAST TECHNIQUE: Multidetector CT imaging of the chest was performed using the standard protocol during bolus administration of intravenous contrast. Multiplanar CT image reconstructions and MIPs were obtained to evaluate the vascular anatomy. RADIATION DOSE REDUCTION: This exam was performed according to the departmental dose-optimization program which includes automated exposure control, adjustment of the mA and/or kV according to patient size and/or use of iterative reconstruction technique. CONTRAST:  64mL OMNIPAQUE IOHEXOL 350 MG/ML SOLN COMPARISON:  Chest radiograph done earlier today FINDINGS: Cardiovascular: There is homogeneous enhancement in thoracic aorta. There is ectasia of ascending thoracic aorta measuring 4 cm. Main pulmonary artery measures 3.2 cm in diameter. There are no intraluminal filling defects in the pulmonary artery branches. Posterior lower lung fields are not included in their entirety limiting evaluation of the posteroinferior lower lobes. Mediastinum/Nodes: There are  enlarged lymph nodes in the mediastinum largest measuring 11 mm in the short axis measurement. Thyroid is smaller than usual. Lungs/Pleura: There are new patchy alveolar and ground-glass infiltrates in the right upper right mid and both lower lung fields, more so on the right side. Posterior costophrenic angles are not fully included limiting evaluation of small portions of both lower lobes. As far as seen, there is no significant pleural effusion or pneumothorax. Upper Abdomen: Surgical clips are seen in gallbladder fossa. Musculoskeletal: Unremarkable. Review of the MIP images confirms the above findings. IMPRESSION: There is no evidence of pulmonary artery embolism. There is no evidence of thoracic aortic dissection. There are new patchy infiltrates in the both lungs, more prominent in the right upper lung fields suggesting multifocal pneumonia. There are slightly enlarged lymph nodes in the mediastinum, possibly suggesting reactive hyperplasia. Electronically Signed   By: Elmer Picker M.D.   On: 02/26/2021 16:31   CT ABDOMEN PELVIS W CONTRAST  Result Date: 02/26/2021 CLINICAL DATA:  Acute abdominal pain beginning yesterday. EXAM: CT ABDOMEN AND PELVIS  WITH CONTRAST TECHNIQUE: Multidetector CT imaging of the abdomen and pelvis was performed using the standard protocol following bolus administration of intravenous contrast. RADIATION DOSE REDUCTION: This exam was performed according to the departmental dose-optimization program which includes automated exposure control, adjustment of the mA and/or kV according to patient size and/or use of iterative reconstruction technique. CONTRAST:  169mL OMNIPAQUE IOHEXOL 300 MG/ML  SOLN COMPARISON:  Noncontrast CT on 11/04/2017 FINDINGS: Lower Chest: Multiple small focal areas of ground-glass opacity in the left lower lobe, and larger ill-defined ground-glass opacity in the right perihilar region, likely infectious or inflammatory etiology. Hepatobiliary: No  hepatic masses identified. Prior cholecystectomy. No evidence of biliary obstruction. Pancreas:  No mass or inflammatory changes. Spleen: Within normal limits in size and appearance. Adrenals/Urinary Tract: No masses identified. Stable mild right renal atrophy and scarring. No evidence of ureteral calculi or hydronephrosis. Stomach/Bowel: Mild-to-moderate colonic wall thickening throughout the ascending, transverse, and descending colon, consistent with colitis. No evidence of small bowel involvement. No evidence of abscess or free fluid. Vascular/Lymphatic: No pathologically enlarged lymph nodes. No acute vascular findings. Aortic atherosclerotic calcification noted. Reproductive: Prior hysterectomy noted. Adnexal regions are unremarkable in appearance. Other:  None. Musculoskeletal:  No suspicious bone lesions identified. IMPRESSION: Mild-to-moderate colitis involving the ascending, transverse, and descending colon. No evidence of abscess or other complication. Multifocal areas of ground-glass opacity visualized portions of both lungs, likely infectious or inflammatory etiology. Recommend clinical correlation, and consider follow-up by chest CT. Aortic Atherosclerosis (ICD10-I70.0). Electronically Signed   By: Marlaine Hind M.D.   On: 02/26/2021 11:39   DG Chest Portable 1 View  Result Date: 02/26/2021 CLINICAL DATA:  Inflammatory lung disease seen on CT. EXAM: PORTABLE CHEST 1 VIEW COMPARISON:  June 08, 2017 FINDINGS: Injectable port in stable position. Cardiomediastinal silhouette is normal. Mediastinal contours appear intact. Ground-glass and nodular subtle airspace consolidation with lower lobe predominance throughout both lungs. Osseous structures are without acute abnormality. Soft tissues are grossly normal. IMPRESSION: Ground-glass and nodular subtle airspace consolidation with lower lobe predominance throughout both lungs. Differential diagnosis includes atypical pneumonia or pulmonary edema.  Electronically Signed   By: Fidela Salisbury M.D.   On: 02/26/2021 12:48        Scheduled Meds:  vitamin C  500 mg Oral Daily   atorvastatin  40 mg Oral Daily   Chlorhexidine Gluconate Cloth  6 each Topical Daily   enoxaparin (LOVENOX) injection  40 mg Subcutaneous Q24H   famotidine  20 mg Oral BID   folic acid  1 mg Oral Daily   levothyroxine  100 mcg Oral QAC breakfast   multivitamin with minerals  1 tablet Oral Daily   sodium chloride flush  3 mL Intravenous Q12H   thiamine  100 mg Oral Daily   vitamin B-12  1,000 mcg Oral Daily   zinc sulfate  220 mg Oral Daily   Continuous Infusions:  sodium chloride     0.9 % NaCl with KCl 20 mEq / L 75 mL/hr at 02/26/21 2222   piperacillin-tazobactam (ZOSYN)  IV 3.375 g (02/27/21 0516)   remdesivir 100 mg in NS 100 mL 100 mg (02/27/21 0953)   [START ON 02/28/2021] vancomycin     vancomycin 1,500 mg (02/27/21 1141)     LOS: 1 day    Time spent: 50 minutes    Sidney Ace, MD [facility]   If 7PM-7AM, please contact night-coverage  02/27/2021, 11:46 AM

## 2021-02-28 ENCOUNTER — Inpatient Hospital Stay: Payer: Medicare HMO

## 2021-02-28 LAB — COMPREHENSIVE METABOLIC PANEL
ALT: 11 U/L (ref 0–44)
AST: 41 U/L (ref 15–41)
Albumin: 3 g/dL — ABNORMAL LOW (ref 3.5–5.0)
Alkaline Phosphatase: 70 U/L (ref 38–126)
Anion gap: 10 (ref 5–15)
BUN: 10 mg/dL (ref 6–20)
CO2: 19 mmol/L — ABNORMAL LOW (ref 22–32)
Calcium: 7.6 mg/dL — ABNORMAL LOW (ref 8.9–10.3)
Chloride: 110 mmol/L (ref 98–111)
Creatinine, Ser: 0.44 mg/dL (ref 0.44–1.00)
GFR, Estimated: >60 mL/min (ref >60)
Glucose, Bld: 64 mg/dL — ABNORMAL LOW (ref 70–99)
Potassium: 3.4 mmol/L — ABNORMAL LOW (ref 3.5–5.1)
Sodium: 139 mmol/L (ref 135–145)
Total Bilirubin: 0.7 mg/dL (ref 0.3–1.2)
Total Protein: 5.7 g/dL — ABNORMAL LOW (ref 6.5–8.1)

## 2021-02-28 LAB — CBC WITH DIFFERENTIAL/PLATELET
Abs Immature Granulocytes: 0.4 K/uL — ABNORMAL HIGH (ref 0.00–0.07)
Basophils Absolute: 0 K/uL (ref 0.0–0.1)
Basophils Relative: 1 %
Eosinophils Absolute: 0 K/uL (ref 0.0–0.5)
Eosinophils Relative: 0 %
HCT: 28.2 % — ABNORMAL LOW (ref 36.0–46.0)
Hemoglobin: 9.6 g/dL — ABNORMAL LOW (ref 12.0–15.0)
Immature Granulocytes: 10 %
Lymphocytes Relative: 20 %
Lymphs Abs: 0.8 K/uL (ref 0.7–4.0)
MCH: 30.4 pg (ref 26.0–34.0)
MCHC: 34 g/dL (ref 30.0–36.0)
MCV: 89.2 fL (ref 80.0–100.0)
Monocytes Absolute: 0.3 K/uL (ref 0.1–1.0)
Monocytes Relative: 7 %
Neutro Abs: 2.4 K/uL (ref 1.7–7.7)
Neutrophils Relative %: 62 %
Platelets: 144 K/uL — ABNORMAL LOW (ref 150–400)
RBC: 3.16 MIL/uL — ABNORMAL LOW (ref 3.87–5.11)
RDW: 17.1 % — ABNORMAL HIGH (ref 11.5–15.5)
Smear Review: NORMAL
WBC: 3.9 K/uL — ABNORMAL LOW (ref 4.0–10.5)
nRBC: 0.5 % — ABNORMAL HIGH (ref 0.0–0.2)

## 2021-02-28 MED ORDER — HYDROMORPHONE HCL 1 MG/ML IJ SOLN
1.0000 mg | INTRAMUSCULAR | Status: DC | PRN
  Administered 2021-02-28 (×3): 1 mg via INTRAVENOUS
  Filled 2021-02-28 (×3): qty 1

## 2021-02-28 MED ORDER — OXYCODONE HCL 5 MG PO TABS
10.0000 mg | ORAL_TABLET | ORAL | Status: DC | PRN
  Administered 2021-02-28 – 2021-03-02 (×7): 10 mg via ORAL
  Filled 2021-02-28 (×8): qty 2

## 2021-02-28 MED ORDER — GABAPENTIN 300 MG PO CAPS
300.0000 mg | ORAL_CAPSULE | Freq: Three times a day (TID) | ORAL | Status: DC
  Administered 2021-02-28 – 2021-03-05 (×14): 300 mg via ORAL
  Filled 2021-02-28 (×16): qty 1

## 2021-02-28 MED ORDER — ALPRAZOLAM 0.25 MG PO TABS
0.2500 mg | ORAL_TABLET | Freq: Three times a day (TID) | ORAL | Status: DC | PRN
  Filled 2021-02-28: qty 1

## 2021-02-28 MED ORDER — TIZANIDINE HCL 4 MG PO TABS
4.0000 mg | ORAL_TABLET | Freq: Every day | ORAL | Status: DC
  Administered 2021-02-28 – 2021-03-04 (×4): 4 mg via ORAL
  Filled 2021-02-28 (×6): qty 1

## 2021-02-28 MED ORDER — VANCOMYCIN HCL 1250 MG/250ML IV SOLN
1250.0000 mg | INTRAVENOUS | Status: DC
  Administered 2021-02-28 – 2021-03-01 (×2): 1250 mg via INTRAVENOUS
  Filled 2021-02-28 (×3): qty 250

## 2021-02-28 MED ORDER — ACETAMINOPHEN 325 MG PO TABS
650.0000 mg | ORAL_TABLET | Freq: Four times a day (QID) | ORAL | Status: DC | PRN
  Administered 2021-03-01: 17:00:00 650 mg via ORAL
  Filled 2021-02-28: qty 2

## 2021-02-28 MED ORDER — ALPRAZOLAM 0.5 MG PO TABS
0.5000 mg | ORAL_TABLET | Freq: Three times a day (TID) | ORAL | Status: DC | PRN
  Administered 2021-02-28 – 2021-03-01 (×3): 0.5 mg via ORAL
  Filled 2021-02-28 (×4): qty 1

## 2021-02-28 NOTE — Progress Notes (Signed)
Patient extremely anxious about pain control. Patient sleeping most of the day, resting comfortably. When she wakes up she is immediately anxious on a scale of 1-10 a 10. Patient yells from the room crying  "get my pain medication now, I am in so much pain." When asked location of pain patient states back, feet, abdomen. Patient educated on pain management, how the orders are written, and barriers for RN in giving pain medications, time constraints. There is no evidence of her learning. Anxiety is a large contributor to her pain/behavior.

## 2021-02-28 NOTE — Progress Notes (Signed)
Pharmacy Antibiotic Note  Adrienne Flores is a 56 y.o. female admitted on 02/26/2021 with bacteremia.  Pharmacy has been consulted for vancomycin dosing. Patient presented with abdominal pain, headache and diarrhea.  She has a port due to being hard stick, appears port place in Nov 2017. She has rheumatoid arthritis but has not seen specialist in years (do not see that she is on immunomodulatory therapy).  She has PMH of chronic renal failure as well  COVID - Positive 1/15 Bcx: 4/4 bottles GPC, BCID Staphylococcus species 1/16 Bcx - NG < 24h C difficle and GI pending ordered (not collected)  Blood cultures from [facility]  10/16 S. 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   Vancomycin 1500mg  IV x 1 loading dose, followed by 1250 mg IV q24h - Dose based on patient receiving 1250mg  IV q24h at [facility] in Oct with at VT = 18 w/ SCr 1.23 Estimated AUC 451.2, est Cmin 10.1 using SCr 0.8 (rounded up) Goal AUC 400-600 Continue piperacillin/tazobactam as ordered Monitor renal function closely. Check levels if remains on vanco >4 days or PRN for changes in renal function Follow-up identification of 5/46 blood cx and result of blood cx drawn today  Height: 5\' 1"  (154.9 cm) Weight: 72.6 kg (160 lb) IBW/kg (Calculated) : 47.8  Temp (24hrs), Avg:98.3 F (36.8 C), Min:98.2 F (36.8 C), Max:98.4 F (36.9 C)  Recent Labs  Lab 02/26/21 0906 02/27/21 0615 02/28/21 0449  WBC 5.1 4.5 3.9*  CREATININE 1.10* 1.34* 0.44     Estimated Creatinine Clearance: 70.7 mL/min (by C-G formula based on SCr of 0.44 mg/dL).    Allergies  Allergen Reactions   Morphine Hives   Morphine And Related Hives and Itching    ONLY MORPHINE, tolerates Dilaudid, oxycodone, and Norco     Methotrexate Derivatives Other (See Comments)    Headaches    Nabumetone Other (See Comments)    Headaches   Methotrexate Other (See Comments)    Headaches     Haloperidol Nausea Only and Other (See Comments)    Patient stated that she broke out in cold sweats, also    Antimicrobials this admission: 1/15 doxy x1 1/15 pip/tazo >> 1/16 vanco >>  Dose adjustments this admission:   Microbiology results: 1/16 Bcx: 4/4 GPC, BCID staph spp  Thank you for allowing pharmacy to be a part of this patients care.  Darnelle Bos, PharmD 02/28/2021 9:21 AM

## 2021-02-28 NOTE — Progress Notes (Signed)
PROGRESS NOTE    Adrienne Flores  IZT:[id number] DOB: [birth year] DOA: 02/26/2021 PCP: [name], MD    Brief Narrative:  56 y.o. female with medical history significant of anemia, chronic abdominal pain, depression fibromyalgia gastroparesis, hypothyroidism, rheumatoid arthritis porphyria came with a chief complaint of abdominal pain, diarrhea, headache. Patient states had progressive headache, lower abdominal pain 3 episodes of diarrhea, dry cough mild shortness of breath with exertion. Patient has history of chronic abdominal pain, cyclic vomiting syndrome.  The symptoms started 2 days ago.  Imaging significant for right ear otitis media and possible mastoiditis.  CTA chest negative for PE, consistent with possible COVID-pneumonia/bacterial pneumonia.  CT abdomen with diffuse colitis  Patient continues to endorse significant abdominal pain.  Nearly in tears.  Vitals remained stable.  Afebrile.  Heart rate and blood pressure within normal limits.  KUB negative.   Assessment & Plan:   Principal Problem:   COVID-19 virus infection Active Problems:   Hypothyroidism   Colitis   Pneumonia due to COVID-19 virus   Mastoiditis   Headache  COVID-19 infection Acute pneumonia Bilateral infiltrates on CT chest Unclear whether this represents a bacterial versus viral pneumonia Also possible inflammatory changes Plan: IV remdesivir Broad-spectrum antibiotics as below  Possible bacteremia Blood cultures from 1/15 and 4/4 positive for staphylococcal species Per ID pharmacy staph stimulants Repeat blood culture demonstrates same pathogen Unclear whether this represents infection or contaminant Plan: Continue vancomycin ID pharmacist discussed case with ID physician Agree with consultation if need be Patient has port and have this determined to be a true gram-positive infection we may need to remove the port Port has been in place since November 2017  Pancolitis CT abdomen  pelvis positive for diffuse colitis Associated abdominal pain and diarrhea C. difficile and GI PCR pending Plan: Empiric Zosyn Monitor vitals and fever curve Pain control Multimodal pain control   Mastoiditis Headache Headache seems improved No indication for LP at this point Plan: Empiric antibiotics Follow cultures Monitor vitals and fever curve As needed headache treatment  Hypothyroidism PTA Synthroid  Chronic abdominal pain Fibromyalgia As needed narcotics patient reports severe abdominal pain.  Is nearly in tears as a result.  Continues to request IV Dilaudid.  Educated on the importance of diagnostics and treatment   DVT prophylaxis: SQ Lovenox Code Status: Full Family Communication: Husband at bedside 1/16, 1/17 Disposition Plan: Status is: Inpatient  Remains inpatient appropriate because: Multiple acute issues.  COVID infection, multifocal pneumonia, mastoiditis, pancolitis.  On IV antibiotics.  Also possible bacteremia.  Disposition plan pending.       Level of care: Med-Surg  Consultants:  None  Procedures:  None  Antimicrobials: Vancomycin Zosyn   Subjective: Patient seen and examined.  Remains very tearful.  Request IV Dilaudid.  Endorses abdominal pain  Objective: Vitals:   02/28/21 0431 02/28/21 0743 02/28/21 1240 02/28/21 1242  BP: (!) 100/59 122/89 95/73 101/67  Pulse: 74 80 70   Resp: 18 18 16    Temp: 98.4 F (36.9 C) 98.2 F (36.8 C) 98.6 F (37 C)   TempSrc: Oral Oral Oral   SpO2: 96% 100% 100%   Weight:      Height:        Intake/Output Summary (Last 24 hours) at 02/28/2021 1315 Last data filed at 02/27/2021 1520 Gross per 24 hour  Intake 1157.9 ml  Output --  Net 1157.9 ml   Filed Weights   02/26/21 0808  Weight: 72.6 kg    Examination:  General  exam: Tearful.  Endorsing abdominal pain Respiratory system: Scattered crackles.  Normal work of breathing.  Room air Cardiovascular system: S1-S2, RRR, no murmurs, no  pedal edema Gastrointestinal system: Soft, nondistended, tender to palpation lower abdomen, negative rebound, normal bowel sounds Central nervous system: Alert and oriented. No focal neurological deficits. Extremities: Symmetric 5 x 5 power. Skin: Scattered excoriations bilateral upper extremities and chest Psychiatry: Judgement and insight appear normal. Mood & affect appropriate.     Data Reviewed: I have personally reviewed following labs and imaging studies  CBC: Recent Labs  Lab 02/26/21 0906 02/27/21 0615 02/28/21 0449  WBC 5.1 4.5 3.9*  NEUTROABS 3.8 3.1 2.4  HGB 12.2 11.4* 9.6*  HCT 36.3 33.2* 28.2*  MCV 87.9 88.1 89.2  PLT 162 174 676*   Basic Metabolic Panel: Recent Labs  Lab 02/26/21 0906 02/27/21 0615 02/28/21 0449  NA 131* 136 139  K 3.2* 3.4* 3.4*  CL 100 103 110  CO2 19* 22 19*  GLUCOSE 90 100* 64*  BUN 9 10 10   CREATININE 1.10* 1.34* 0.44  CALCIUM 8.2* 7.8* 7.6*   GFR: Estimated Creatinine Clearance: 70.7 mL/min (by C-G formula based on SCr of 0.44 mg/dL). Liver Function Tests: Recent Labs  Lab 02/26/21 0906 02/27/21 0615 02/28/21 0449  AST 48* 49* 41  ALT 19 17 11   ALKPHOS 125 115 70  BILITOT 1.0 1.0 0.7  PROT 7.4 7.0 5.7*  ALBUMIN 4.0 3.8 3.0*   Recent Labs  Lab 02/26/21 0906  LIPASE 26   No results for input(s): AMMONIA in the last 168 hours. Coagulation Profile: No results for input(s): INR, PROTIME in the last 168 hours. Cardiac Enzymes: No results for input(s): CKTOTAL, CKMB, CKMBINDEX, TROPONINI in the last 168 hours. BNP (last 3 results) No results for input(s): PROBNP in the last 8760 hours. HbA1C: No results for input(s): HGBA1C in the last 72 hours. CBG: No results for input(s): GLUCAP in the last 168 hours. Lipid Profile: No results for input(s): CHOL, HDL, LDLCALC, TRIG, CHOLHDL, LDLDIRECT in the last 72 hours. Thyroid Function Tests: No results for input(s): TSH, T4TOTAL, FREET4, T3FREE, THYROIDAB in the last 72  hours. Anemia Panel: Recent Labs    02/26/21 1613  FERRITIN 1,886*   Sepsis Labs: Recent Labs  Lab 02/26/21 1613  PROCALCITON <0.10    Recent Results (from the past 240 hour(s))  Resp Panel by RT-PCR (Flu A&B, Covid) Nasopharyngeal Swab     Status: Abnormal   Collection Time: 02/26/21  1:20 PM   Specimen: Nasopharyngeal Swab; Nasopharyngeal(NP) swabs in vial transport medium  Result Value Ref Range Status   SARS Coronavirus 2 by RT PCR POSITIVE (A) NEGATIVE Final    Comment: (NOTE) SARS-CoV-2 target nucleic acids are DETECTED.  The SARS-CoV-2 RNA is generally detectable in upper respiratory specimens during the acute phase of infection. Positive results are indicative of the presence of the identified virus, but do not rule out bacterial infection or co-infection with other pathogens not detected by the test. Clinical correlation with patient history and other diagnostic information is necessary to determine patient infection status. The expected result is Negative.  Fact Sheet for Patients: EntrepreneurPulse.com.au  Fact Sheet for Healthcare Providers: IncredibleEmployment.be  This test is not yet approved or cleared by the [place] FDA and  has been authorized for detection and/or diagnosis of SARS-CoV-2 by FDA under an Emergency Use Authorization (EUA).  This EUA will remain in effect (meaning this test can be used) for the duration of  the  COVID-19 declaration under Section 564(b)(1) of the A ct, 21 U.S.C. section 360bbb-3(b)(1), unless the authorization is terminated or revoked sooner.     Influenza A by PCR NEGATIVE NEGATIVE Final   Influenza B by PCR NEGATIVE NEGATIVE Final    Comment: (NOTE) The Xpert Xpress SARS-CoV-2/FLU/RSV plus assay is intended as an aid in the diagnosis of influenza from Nasopharyngeal swab specimens and should not be used as a sole basis for treatment. Nasal washings and aspirates are  unacceptable for Xpert Xpress SARS-CoV-2/FLU/RSV testing.  Fact Sheet for Patients: EntrepreneurPulse.com.au  Fact Sheet for Healthcare Providers: IncredibleEmployment.be  This test is not yet approved or cleared by the [place] FDA and has been authorized for detection and/or diagnosis of SARS-CoV-2 by FDA under an Emergency Use Authorization (EUA). This EUA will remain in effect (meaning this test can be used) for the duration of the COVID-19 declaration under Section 564(b)(1) of the Act, 21 U.S.C. section 360bbb-3(b)(1), unless the authorization is terminated or revoked.  Performed at [facility], [address] [place], [place] [address]   Culture, blood (Routine X 2) w Reflex to ID Panel     Status: Abnormal (Preliminary result)   Collection Time: 02/26/21  4:13 PM   Specimen: BLOOD  Result Value Ref Range Status   Specimen Description   Final    BLOOD RIGHT HAND Performed at [facility], [address], [place], [place] [address]    Special Requests   Final    BOTTLES DRAWN AEROBIC AND ANAEROBIC Blood Culture adequate volume Performed at [facility], [address] [place], [place] [address]    Culture  Setup Time   Final    GRAM POSITIVE COCCI IN BOTH AEROBIC AND ANAEROBIC BOTTLES CRITICAL VALUE NOTED.  VALUE IS CONSISTENT WITH PREVIOUSLY REPORTED AND CALLED VALUE. Performed at [facility], [address] [place], [place] [address]    Culture STAPHYLOCOCCUS SIMULANS (A)  Final   Report Status PENDING  Incomplete  Culture, blood (Routine X 2) w Reflex to ID Panel     Status: Abnormal (Preliminary result)   Collection Time: 02/26/21  4:13 PM   Specimen: BLOOD  Result Value Ref Range Status   Specimen Description   Final    BLOOD PORT Performed at [facility], [address], [place], [place] [address]    Special Requests   Final    BOTTLES DRAWN AEROBIC AND  ANAEROBIC Blood Culture adequate volume Performed at [facility], [address], [place], [place] [address]    Culture  Setup Time   Final    GRAM POSITIVE COCCI IN BOTH AEROBIC AND ANAEROBIC BOTTLES Organism ID to follow CRITICAL RESULT CALLED TO, READ BACK BY AND VERIFIED WITH: Paulina Fusi [facility] [id number] 02/27/21 HNM Performed at [facility], [address], [place], [place] [address]    Culture (A)  Final    STAPHYLOCOCCUS SIMULANS SUSCEPTIBILITIES TO FOLLOW Performed at [facility], [place] [address], [place],  [address]    Report Status PENDING  Incomplete  Blood Culture ID Panel (Reflexed)     Status: Abnormal   Collection Time: 02/26/21  4:13 PM  Result Value Ref Range Status   Enterococcus faecalis NOT DETECTED NOT DETECTED Final   Enterococcus Faecium NOT DETECTED NOT DETECTED Final   Listeria monocytogenes NOT DETECTED NOT DETECTED Final   Staphylococcus species DETECTED (A) NOT DETECTED Final    Comment: CRITICAL RESULT CALLED TO, READ BACK BY AND VERIFIED  WITH: Paulina Fusi [facility] [id number] 02/27/21 HNM    Staphylococcus aureus (BCID) NOT DETECTED NOT DETECTED Final   Staphylococcus epidermidis NOT DETECTED NOT DETECTED Final   Staphylococcus lugdunensis NOT DETECTED NOT DETECTED Final   Streptococcus species NOT DETECTED NOT DETECTED Final   Streptococcus agalactiae NOT DETECTED NOT DETECTED Final   Streptococcus pneumoniae NOT DETECTED NOT DETECTED Final   Streptococcus pyogenes NOT DETECTED NOT DETECTED Final   A.calcoaceticus-baumannii NOT DETECTED NOT DETECTED Final   Bacteroides fragilis NOT DETECTED NOT DETECTED Final   Enterobacterales NOT DETECTED NOT DETECTED Final   Enterobacter cloacae complex NOT DETECTED NOT DETECTED Final   Escherichia coli NOT DETECTED NOT DETECTED Final   Klebsiella aerogenes NOT DETECTED NOT DETECTED Final   Klebsiella oxytoca NOT DETECTED NOT DETECTED Final   Klebsiella pneumoniae NOT DETECTED NOT DETECTED  Final   Proteus species NOT DETECTED NOT DETECTED Final   Salmonella species NOT DETECTED NOT DETECTED Final   Serratia marcescens NOT DETECTED NOT DETECTED Final   Haemophilus influenzae NOT DETECTED NOT DETECTED Final   Neisseria meningitidis NOT DETECTED NOT DETECTED Final   Pseudomonas aeruginosa NOT DETECTED NOT DETECTED Final   Stenotrophomonas maltophilia NOT DETECTED NOT DETECTED Final   Candida albicans NOT DETECTED NOT DETECTED Final   Candida auris NOT DETECTED NOT DETECTED Final   Candida glabrata NOT DETECTED NOT DETECTED Final   Candida krusei NOT DETECTED NOT DETECTED Final   Candida parapsilosis NOT DETECTED NOT DETECTED Final   Candida tropicalis NOT DETECTED NOT DETECTED Final   Cryptococcus neoformans/gattii NOT DETECTED NOT DETECTED Final    Comment: Performed at [facility], [address] [place], [place] [address]  CULTURE, BLOOD (ROUTINE X 2) w Reflex to ID Panel     Status: None (Preliminary result)   Collection Time: 02/27/21 10:53 AM   Specimen: BLOOD  Result Value Ref Range Status   Specimen Description BLOOD [facility]  Final   Special Requests   Final    BOTTLES DRAWN AEROBIC AND ANAEROBIC Blood Culture adequate volume   Culture   Final    NO GROWTH < 24 HOURS Performed at [facility], [address] [place], [place] [address]    Report Status PENDING  Incomplete  CULTURE, BLOOD (ROUTINE X 2) w Reflex to ID Panel     Status: None (Preliminary result)   Collection Time: 02/27/21 10:54 AM   Specimen: BLOOD  Result Value Ref Range Status   Specimen Description BLOOD [facility]  Final   Special Requests   Final    BOTTLES DRAWN AEROBIC AND ANAEROBIC Blood Culture adequate volume   Culture   Final    NO GROWTH < 24 HOURS Performed at [facility], [address], [place], [place] [address]    Report Status PENDING  Incomplete         Radiology Studies: DG Abd 1 View  Result Date: 02/28/2021 CLINICAL DATA:  Abdominal pain  EXAM: ABDOMEN - 1 VIEW COMPARISON:  CT abdomen/pelvis 02/26/2021, KUB 05/07/2016 FINDINGS: There is a nonobstructive bowel gas pattern. There is no abnormal soft tissue calcification. There is no definite free intraperitoneal air, within the confines of supine technique. Cholecystectomy clips are noted. A clip is also noted in the pelvis. Density in the bladder may reflect excretion of intravenous contrast from a prior imaging study. There is no acute osseous abnormality. IMPRESSION: Nonobstructive bowel gas pattern.  No acute finding. Electronically Signed   By: Valetta Mole M.D.   On: 02/28/2021 12:06  CT Angio Chest Pulmonary Embolism (PE) W or WO Contrast  Result Date: 02/26/2021 CLINICAL DATA:  Chest pain, shortness of breath EXAM: CT ANGIOGRAPHY CHEST WITH CONTRAST TECHNIQUE: Multidetector CT imaging of the chest was performed using the standard protocol during bolus administration of intravenous contrast. Multiplanar CT image reconstructions and MIPs were obtained to evaluate the vascular anatomy. RADIATION DOSE REDUCTION: This exam was performed according to the departmental dose-optimization program which includes automated exposure control, adjustment of the mA and/or kV according to patient size and/or use of iterative reconstruction technique. CONTRAST:  68mL OMNIPAQUE IOHEXOL 350 MG/ML SOLN COMPARISON:  Chest radiograph done earlier today FINDINGS: Cardiovascular: There is homogeneous enhancement in thoracic aorta. There is ectasia of ascending thoracic aorta measuring 4 cm. Main pulmonary artery measures 3.2 cm in diameter. There are no intraluminal filling defects in the pulmonary artery branches. Posterior lower lung fields are not included in their entirety limiting evaluation of the posteroinferior lower lobes. Mediastinum/Nodes: There are enlarged lymph nodes in the mediastinum largest measuring 11 mm in the short axis measurement. Thyroid is smaller than usual. Lungs/Pleura: There are new  patchy alveolar and ground-glass infiltrates in the right upper right mid and both lower lung fields, more so on the right side. Posterior costophrenic angles are not fully included limiting evaluation of small portions of both lower lobes. As far as seen, there is no significant pleural effusion or pneumothorax. Upper Abdomen: Surgical clips are seen in gallbladder fossa. Musculoskeletal: Unremarkable. Review of the MIP images confirms the above findings. IMPRESSION: There is no evidence of pulmonary artery embolism. There is no evidence of thoracic aortic dissection. There are new patchy infiltrates in the both lungs, more prominent in the right upper lung fields suggesting multifocal pneumonia. There are slightly enlarged lymph nodes in the mediastinum, possibly suggesting reactive hyperplasia. Electronically Signed   By: Elmer Picker M.D.   On: 02/26/2021 16:31        Scheduled Meds:  vitamin C  500 mg Oral Daily   atorvastatin  40 mg Oral Daily   Chlorhexidine Gluconate Cloth  6 each Topical Daily   enoxaparin (LOVENOX) injection  40 mg Subcutaneous Q24H   famotidine  20 mg Oral BID   folic acid  1 mg Oral Daily   gabapentin  300 mg Oral TID   levothyroxine  100 mcg Oral QAC breakfast   multivitamin with minerals  1 tablet Oral Daily   sodium chloride flush  3 mL Intravenous Q12H   thiamine  100 mg Oral Daily   tiZANidine  4 mg Oral QHS   vitamin B-12  1,000 mcg Oral Daily   zinc sulfate  220 mg Oral Daily   Continuous Infusions:  sodium chloride     0.9 % NaCl with KCl 20 mEq / L 75 mL/hr at 02/26/21 2222   piperacillin-tazobactam (ZOSYN)  IV 3.375 g (02/28/21 0628)   vancomycin 1,250 mg (02/28/21 1250)     LOS: 2 days    Time spent: 35 minutes    Sidney Ace, MD [facility]   If 7PM-7AM, please contact night-coverage  02/28/2021, 1:15 PM

## 2021-03-01 DIAGNOSIS — B957 Other staphylococcus as the cause of diseases classified elsewhere: Secondary | ICD-10-CM

## 2021-03-01 DIAGNOSIS — R7881 Bacteremia: Secondary | ICD-10-CM | POA: Diagnosis not present

## 2021-03-01 DIAGNOSIS — U071 COVID-19: Secondary | ICD-10-CM | POA: Diagnosis not present

## 2021-03-01 DIAGNOSIS — J1282 Pneumonia due to coronavirus disease 2019: Secondary | ICD-10-CM

## 2021-03-01 LAB — BASIC METABOLIC PANEL
Anion gap: 4 — ABNORMAL LOW (ref 5–15)
BUN: 10 mg/dL (ref 6–20)
CO2: 19 mmol/L — ABNORMAL LOW (ref 22–32)
Calcium: 7.2 mg/dL — ABNORMAL LOW (ref 8.9–10.3)
Chloride: 115 mmol/L — ABNORMAL HIGH (ref 98–111)
Creatinine, Ser: 1.32 mg/dL — ABNORMAL HIGH (ref 0.44–1.00)
GFR, Estimated: 47 mL/min — ABNORMAL LOW (ref >60)
Glucose, Bld: 99 mg/dL (ref 70–99)
Potassium: 3.2 mmol/L — ABNORMAL LOW (ref 3.5–5.1)
Sodium: 138 mmol/L (ref 135–145)

## 2021-03-01 LAB — CULTURE, BLOOD (ROUTINE X 2)
Special Requests: ADEQUATE
Special Requests: ADEQUATE

## 2021-03-01 LAB — GLUCOSE, CAPILLARY
Glucose-Capillary: 53 mg/dL — ABNORMAL LOW (ref 70–99)
Glucose-Capillary: 54 mg/dL — ABNORMAL LOW (ref 70–99)
Glucose-Capillary: 55 mg/dL — ABNORMAL LOW (ref 70–99)
Glucose-Capillary: 57 mg/dL — ABNORMAL LOW (ref 70–99)
Glucose-Capillary: 63 mg/dL — ABNORMAL LOW (ref 70–99)
Glucose-Capillary: 69 mg/dL — ABNORMAL LOW (ref 70–99)
Glucose-Capillary: 77 mg/dL (ref 70–99)

## 2021-03-01 LAB — CBC
HCT: 25.2 % — ABNORMAL LOW (ref 36.0–46.0)
Hemoglobin: 8.5 g/dL — ABNORMAL LOW (ref 12.0–15.0)
MCH: 30.8 pg (ref 26.0–34.0)
MCHC: 33.7 g/dL (ref 30.0–36.0)
MCV: 91.3 fL (ref 80.0–100.0)
Platelets: 128 10*3/uL — ABNORMAL LOW (ref 150–400)
RBC: 2.76 MIL/uL — ABNORMAL LOW (ref 3.87–5.11)
RDW: 17.7 % — ABNORMAL HIGH (ref 11.5–15.5)
WBC: 4.1 10*3/uL (ref 4.0–10.5)
nRBC: 0 % (ref 0.0–0.2)

## 2021-03-01 LAB — MAGNESIUM: Magnesium: 1.9 mg/dL (ref 1.7–2.4)

## 2021-03-01 LAB — CREATININE, SERUM
Creatinine, Ser: 1.38 mg/dL — ABNORMAL HIGH (ref 0.44–1.00)
GFR, Estimated: 45 mL/min — ABNORMAL LOW (ref >60)

## 2021-03-01 LAB — PHOSPHORUS: Phosphorus: 1.3 mg/dL — ABNORMAL LOW (ref 2.5–4.6)

## 2021-03-01 LAB — PROCALCITONIN: Procalcitonin: <0.1 ng/mL

## 2021-03-01 MED ORDER — SODIUM PHOSPHATES 45 MMOLE/15ML IV SOLN: 30.0000 mmol | INTRAVENOUS | Status: DC

## 2021-03-01 MED ORDER — MIDODRINE HCL 5 MG PO TABS
10.0000 mg | ORAL_TABLET | ORAL | Status: DC
  Filled 2021-03-01: qty 2

## 2021-03-01 MED ORDER — SODIUM CHLORIDE 0.9 % IV BOLUS
500.0000 mL | INTRAVENOUS | Status: AC
  Administered 2021-03-01: 500 mL via INTRAVENOUS

## 2021-03-01 MED ORDER — POTASSIUM CHLORIDE CRYS ER 20 MEQ PO TBCR
40.0000 meq | EXTENDED_RELEASE_TABLET | ORAL | Status: AC
  Administered 2021-03-01: 40 meq via ORAL
  Filled 2021-03-01: qty 2

## 2021-03-01 MED ORDER — DEXTROSE 5 % IV SOLN
30.0000 mmol | INTRAVENOUS | Status: AC
  Administered 2021-03-01: 19:00:00 30 mmol via INTRAVENOUS
  Filled 2021-03-01: qty 10

## 2021-03-01 MED ORDER — MIDODRINE HCL 5 MG PO TABS
10.0000 mg | ORAL_TABLET | Freq: Three times a day (TID) | ORAL | Status: DC | PRN
  Administered 2021-03-03 (×2): 10 mg via ORAL
  Filled 2021-03-01 (×2): qty 2

## 2021-03-01 MED ORDER — VANCOMYCIN HCL IN DEXTROSE 1-5 GM/200ML-% IV SOLN
1000.0000 mg | INTRAVENOUS | Status: DC
  Filled 2021-03-01: qty 200

## 2021-03-01 MED ORDER — MIDODRINE HCL 5 MG PO TABS: 10.0000 mg | ORAL_TABLET | Freq: Three times a day (TID) | ORAL | Status: DC

## 2021-03-01 MED ORDER — HYDROXYZINE HCL 10 MG PO TABS
10.0000 mg | ORAL_TABLET | Freq: Three times a day (TID) | ORAL | Status: DC | PRN
  Administered 2021-03-01 – 2021-03-04 (×3): 10 mg via ORAL
  Filled 2021-03-01 (×4): qty 1

## 2021-03-01 NOTE — Consult Note (Signed)
NAME: Adrienne Flores  DOB: 1966/01/27  MRN: [id number]  Date/Time: 03/01/2021 12:33 PM  REQUESTING PROVIDER:Dr.HAll Subjective:  REASON FOR CONSULT: bacteremia  ?No history available from patient as she is groggy and sleepy Husband at bed side who is giving the history Adrienne Flores is a 56 y.o. female with a history of fibromyalgia who is on Tramadol, hypothyroidism, hyperlipidemia, ? Rheumatoid arthritis, b/l TKA, chronic abdominal pain presented to the hospital on 02/26/21  with cough, pain abdomen Pt went to [facility] on 02/24/21 with cough and back pain and was diagnosed with covid- She has not been vaccinated- she was not given paxlovid as they write that she has CKD On presentation to our ED vitals were Temp 98.5, BP 119/82, pulse 108, RR 16 and sats 93%. Labs showed wbc 5.1, HB 12.2, plt 162 and cr 1.10. CXR showed Ground-glass and nodular subtle airspace consolidation. She was started on remdisivir. She was also started on doxy which was changed to zosyn CT-scan of the abdomen abdomen also showed colitis. As per husband pt stays awake the whole night, would watch TV and sleeps day time- she is pretty much in house either in the couch or in bed She has intermittent abdominal pain.  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 Pt was in [facility] in sept 2022 for cellulitis under her breasts and that time culture was positive for staph hominis Pt has been started on vanco after repeat blood cultures were sent  Past Medical History:  Diagnosis Date   Anemia    At high risk for falls    Chronic abdominal pain 2008   dates back to at least 2008.  multiple ultrasound, CT scans, x rays at [facility], [place].  [facility] colonoscopy, egd unrevealing.     Colitis 2015   ? colitis per CTs in 2015, 2016 and 11/2015.  presumed infectious.  colonoscopy in 2015  negative for colitis.    Constipation    Cyclic vomiting syndrome 2015   per opinion  of Dr Granville Lewis, IBS specialist at [facility].    Degenerative disc disease    Depression    Fibromyalgia    Gastroparesis 03/2011   markedly abnormal gastric emptying study.    Hematemesis 11/2015   mild to moderate blood in emesis appeared after 2 plus weeks of frequent, non-bloody emesis.    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 Hemorrhoids. sigmoid tics. random biopsies negative for microscopic colitis or any pathology.    COLONOSCOPY WITH PROPOFOL N/A 05/10/2017   Procedure: COLONOSCOPY WITH PROPOFOL;  Surgeon: Lucilla Lame, MD;  Location: [place];  Service: Endoscopy;  Laterality: N/A;   ESOPHAGOGASTRODUODENOSCOPY  11/2013   Dr Kennith Gain at [facility].  Normal study.  Duodenal bx negative for villous atrophy.    ESOPHAGOGASTRODUODENOSCOPY N/A 12/09/2015   Procedure: ESOPHAGOGASTRODUODENOSCOPY (EGD);  Surgeon: Nelida Meuse III,  MD;  Location: [facility];  Service: Endoscopy;  Laterality: N/A;   ESOPHAGOGASTRODUODENOSCOPY (EGD) WITH PROPOFOL N/A 05/10/2017   Procedure: ESOPHAGOGASTRODUODENOSCOPY (EGD) WITH PROPOFOL;  Surgeon: Lucilla Lame, MD;  Location: [place];  Service: Endoscopy;  Laterality: N/A;   KNEE SURGERY Left    OOPHORECTOMY     PORTA CATH INSERTION N/A 04/18/2017   Procedure: PORTA CATH INSERTION;  Surgeon: Katha Cabal, MD;  Location: [place] [facility];  Service: Cardiovascular;  Laterality: N/A;   PORTA CATH INSERTION Right 05/21/2017   Procedure: PORTA CATH INSERTION;  Surgeon: Katha Cabal, MD;  Location: [place] [facility];  Service: Cardiovascular;   Laterality: Right;   REPLACEMENT TOTAL KNEE Bilateral     Social History   Socioeconomic History   Marital status: Married    Spouse name: Not on file   Number of children: Not on file   Years of education: Not on file   Highest education level: Not on file  Occupational History   Not on file  Tobacco Use   Smoking status: Never   Smokeless tobacco: Never  Vaping Use   Vaping Use: Never used  Substance and Sexual Activity   Alcohol use: No   Drug use: No   Sexual activity: Yes    Partners: Male  Other Topics Concern   Not on file  Social History Narrative   Not on file   Social Determinants of Health   Financial Resource Strain: Not on file  Food Insecurity: Not on file  Transportation Needs: Not on file  Physical Activity: Not on file  Stress: Not on file  Social Connections: Not on file  Intimate Partner Violence: Not on file    Family History  Problem Relation Age of Onset   Leukemia Mother    Arthritis Mother    Early death Mother    Miscarriages / [place] Mother    Hypothyroidism Sister    Cancer Paternal Grandmother    Cancer Paternal Grandfather    Hypothyroidism Brother    Allergies  Allergen Reactions   Morphine Hives   Morphine And Related Hives and Itching    ONLY MORPHINE, tolerates Dilaudid, oxycodone, and Norco    Methotrexate Derivatives Other (See Comments)    Headaches    Nabumetone Other (See Comments)    Headaches   Methotrexate Other (See Comments)    Headaches     Haloperidol Nausea Only and Other (See Comments)    Patient stated that she broke out in cold sweats, also   I? Current Facility-Administered Medications  Medication Dose Route Frequency Provider Last Rate Last Admin   0.9 %  sodium chloride infusion  250 mL Intravenous PRN Cristescu, Mircea G, MD       0.9 % NaCl with KCl 20 mEq/ L  infusion   Intravenous Continuous Cristescu, Linard Millers, MD 75 mL/hr at 02/28/21 1438 New Bag at 02/28/21 1438   acetaminophen  (TYLENOL) tablet 650 mg  650 mg Oral Q6H PRN Ralene Muskrat B, MD       ALPRAZolam Duanne Moron) tablet 0.5 mg  0.5 mg Oral TID PRN Ralene Muskrat B, MD   0.5 mg at 03/01/21 8786   ascorbic acid (VITAMIN C) tablet 500 mg  500 mg Oral Daily Cristescu, Mircea G, MD   500 mg at 03/01/21 0921   atorvastatin (LIPITOR) tablet 40 mg  40 mg Oral Daily Cristescu, Linard Millers, MD   40 mg at 03/01/21 0921   Chlorhexidine Gluconate Cloth 2 %  PADS 6 each  6 each Topical Daily Cristescu, Linard Millers, MD   6 each at 02/27/21 0955   enoxaparin (LOVENOX) injection 40 mg  40 mg Subcutaneous Q24H Cristescu, Mircea G, MD   40 mg at 02/28/21 1652   famotidine (PEPCID) tablet 20 mg  20 mg Oral BID Cristescu, Linard Millers, MD   20 mg at 75/10/25 8527   folic acid (FOLVITE) tablet 1 mg  1 mg Oral Daily Cristescu, Mircea G, MD   1 mg at 03/01/21 7824   gabapentin (NEURONTIN) capsule 300 mg  300 mg Oral TID Ralene Muskrat B, MD   300 mg at 03/01/21 2353   HYDROmorphone (DILAUDID) injection 1 mg  1 mg Intravenous Q3H PRN Ralene Muskrat B, MD   1 mg at 02/28/21 2208   hydrOXYzine (ATARAX) tablet 10 mg  10 mg Oral TID PRN Kayleen Memos, DO       levothyroxine (SYNTHROID) tablet 100 mcg  100 mcg Oral QAC breakfast Cristescu, Linard Millers, MD   100 mcg at 03/01/21 0522   multivitamin with minerals tablet 1 tablet  1 tablet Oral Daily Cristescu, Linard Millers, MD   1 tablet at 03/01/21 6144   oxyCODONE (Oxy IR/ROXICODONE) immediate release tablet 10 mg  10 mg Oral Q4H PRN Ralene Muskrat B, MD   10 mg at 03/01/21 0921   piperacillin-tazobactam (ZOSYN) IVPB 3.375 g  3.375 g Intravenous Q8H Cristescu, Mircea G, MD 12.5 mL/hr at 03/01/21 0621 3.375 g at 03/01/21 3154   sodium chloride flush (NS) 0.9 % injection 3 mL  3 mL Intravenous Q12H Cristescu, Mircea G, MD   3 mL at 03/01/21 0922   sodium chloride flush (NS) 0.9 % injection 3 mL  3 mL Intravenous PRN Cristescu, Mircea G, MD       thiamine tablet 100 mg  100 mg Oral Daily Cristescu,  Mircea G, MD   100 mg at 03/01/21 0921   tiZANidine (ZANAFLEX) tablet 4 mg  4 mg Oral QHS Sreenath, Sudheer B, MD   4 mg at 02/28/21 2255   vancomycin (VANCOREADY) IVPB 1250 mg/250 mL  1,250 mg Intravenous Q24H Darnelle Bos, RPH 166.7 mL/hr at 03/01/21 0920 1,250 mg at 03/01/21 0920   vitamin B-12 (CYANOCOBALAMIN) tablet 1,000 mcg  1,000 mcg Oral Daily Cristescu, Mircea G, MD   1,000 mcg at 03/01/21 0086   zinc sulfate capsule 220 mg  220 mg Oral Daily Cristescu, Linard Millers, MD   220 mg at 03/01/21 7619     Abtx:  Anti-infectives (From admission, onward)    Start     Dose/Rate Route Frequency Ordered Stop   02/28/21 1000  vancomycin (VANCOCIN) IVPB 1000 mg/200 mL premix  Status:  Discontinued        1,000 mg 200 mL/hr over 60 Minutes Intravenous Every 24 hours 02/27/21 0950 02/28/21 0914   02/28/21 1000  vancomycin (VANCOREADY) IVPB 1250 mg/250 mL        1,250 mg 166.7 mL/hr over 90 Minutes Intravenous Every 24 hours 02/28/21 0914     02/27/21 1200  vancomycin (VANCOREADY) IVPB 1500 mg/300 mL        1,500 mg 150 mL/hr over 120 Minutes Intravenous  Once 02/27/21 0950 02/27/21 1341   02/27/21 1000  remdesivir 100 mg in sodium chloride 0.9 % 100 mL IVPB       See Hyperspace for full Linked Orders Report.   100 mg 200 mL/hr over 30 Minutes Intravenous Daily 02/26/21 1606 02/28/21 1043  02/26/21 1930  doxycycline (VIBRAMYCIN) 100 mg in sodium chloride 0.9 % 250 mL IVPB  Status:  Discontinued        100 mg 125 mL/hr over 120 Minutes Intravenous Every 12 hours 02/26/21 1900 02/27/21 0735   02/26/21 1630  piperacillin-tazobactam (ZOSYN) IVPB 3.375 g        3.375 g 12.5 mL/hr over 240 Minutes Intravenous Every 8 hours 02/26/21 1606     02/26/21 1615  remdesivir 200 mg in sodium chloride 0.9% 250 mL IVPB       See Hyperspace for full Linked Orders Report.   200 mg 580 mL/hr over 30 Minutes Intravenous Once 02/26/21 1606 02/26/21 1745       REVIEW OF SYSTEMS:  NA She has been getting  diluadid, oxycodone, gabapentin, tizanidine Objective VITALS:  BP (!) 73/50 (BP Location: Right Wrist)    Pulse 63    Temp 97.6 F (36.4 C) (Axillary)    Resp 16    Ht 5\' 1"  (1.549 m)    Wt 72.6 kg    SpO2 94%    BMI 30.23 kg/m  PHYSICAL EXAM:  General: sleeping, on waking her up she is groggy and tries to answer some questions Head: Normocephalic, without obvious abnormality, atraumatic. Eyes: did not examine ENT cannot examine Neck: Supple, symmetrical, no adenopathy, thyroid: non tender no carotid bruit and no JVD. Lungs: b/l air entry Heart: Regular rate and rhythm, no murmur, rub or gallop. PORT site no erythema /swelling or discharge  Abdomen: Soft, non-tender, Extremities: atraumatic, no cyanosis. No edema. No clubbing Skin: limited - as she is not participating in the examination Lymph: Cervical, supraclavicular normal. Neurologic: Grossly non-focal Pertinent Labs Lab Results CBC    Component Value Date/Time   WBC 4.1 03/01/2021 1042   RBC 2.76 (L) 03/01/2021 1042   HGB 8.5 (L) 03/01/2021 1042   HGB 12.7 06/02/2013 2252   HCT 25.2 (L) 03/01/2021 1042   HCT 37.3 06/02/2013 2252   PLT 128 (L) 03/01/2021 1042   PLT 193 06/02/2013 2252   MCV 91.3 03/01/2021 1042   MCV 94 06/02/2013 2252   MCH 30.8 03/01/2021 1042   MCHC 33.7 03/01/2021 1042   RDW 17.7 (H) 03/01/2021 1042   RDW 15.7 (H) 06/02/2013 2252   LYMPHSABS 0.8 02/28/2021 0449   LYMPHSABS 0.7 (L) 06/02/2013 2252   MONOABS 0.3 02/28/2021 0449   MONOABS 0.1 (L) 06/02/2013 2252   EOSABS 0.0 02/28/2021 0449   EOSABS 0.0 06/02/2013 2252   BASOSABS 0.0 02/28/2021 0449   BASOSABS 0.1 06/02/2013 2252    CMP Latest Ref Rng & Units 03/01/2021 03/01/2021 02/28/2021  Glucose 70 - 99 mg/dL 99 - 64(L)  BUN 6 - 20 mg/dL 10 - 10  Creatinine 0.44 - 1.00 mg/dL 1.32(H) 1.38(H) 0.44  Sodium 135 - 145 mmol/L 138 - 139  Potassium 3.5 - 5.1 mmol/L 3.2(L) - 3.4(L)  Chloride 98 - 111 mmol/L 115(H) - 110  CO2 22 - 32 mmol/L  19(L) - 19(L)  Calcium 8.9 - 10.3 mg/dL 7.2(L) - 7.6(L)  Total Protein 6.5 - 8.1 g/dL - - 5.7(L)  Total Bilirubin 0.3 - 1.2 mg/dL - - 0.7  Alkaline Phos 38 - 126 U/L - - 70  AST 15 - 41 U/L - - 41  ALT 0 - 44 U/L - - 11      Microbiology: Recent Results (from the past 240 hour(s))  Resp Panel by RT-PCR (Flu A&B, Covid) Nasopharyngeal Swab     Status: Abnormal  Collection Time: 02/26/21  1:20 PM   Specimen: Nasopharyngeal Swab; Nasopharyngeal(NP) swabs in vial transport medium  Result Value Ref Range Status   SARS Coronavirus 2 by RT PCR POSITIVE (A) NEGATIVE Final    Comment: (NOTE) SARS-CoV-2 target nucleic acids are DETECTED.  The SARS-CoV-2 RNA is generally detectable in upper respiratory specimens during the acute phase of infection. Positive results are indicative of the presence of the identified virus, but do not rule out bacterial infection or co-infection with other pathogens not detected by the test. Clinical correlation with patient history and other diagnostic information is necessary to determine patient infection status. The expected result is Negative.  Fact Sheet for Patients: EntrepreneurPulse.com.au  Fact Sheet for Healthcare Providers: IncredibleEmployment.be  This test is not yet approved or cleared by the [place] FDA and  has been authorized for detection and/or diagnosis of SARS-CoV-2 by FDA under an Emergency Use Authorization (EUA).  This EUA will remain in effect (meaning this test can be used) for the duration of  the COVID-19 declaration under Section 564(b)(1) of the A ct, 21 U.S.C. section 360bbb-3(b)(1), unless the authorization is terminated or revoked sooner.     Influenza A by PCR NEGATIVE NEGATIVE Final   Influenza B by PCR NEGATIVE NEGATIVE Final    Comment: (NOTE) The Xpert Xpress SARS-CoV-2/FLU/RSV plus assay is intended as an aid in the diagnosis of influenza from Nasopharyngeal swab  specimens and should not be used as a sole basis for treatment. Nasal washings and aspirates are unacceptable for Xpert Xpress SARS-CoV-2/FLU/RSV testing.  Fact Sheet for Patients: EntrepreneurPulse.com.au  Fact Sheet for Healthcare Providers: IncredibleEmployment.be  This test is not yet approved or cleared by the [place] FDA and has been authorized for detection and/or diagnosis of SARS-CoV-2 by FDA under an Emergency Use Authorization (EUA). This EUA will remain in effect (meaning this test can be used) for the duration of the COVID-19 declaration under Section 564(b)(1) of the Act, 21 U.S.C. section 360bbb-3(b)(1), unless the authorization is terminated or revoked.  Performed at [facility], [address] [place], [place] [address]   Culture, blood (Routine X 2) w Reflex to ID Panel     Status: Abnormal   Collection Time: 02/26/21  4:13 PM   Specimen: BLOOD  Result Value Ref Range Status   Specimen Description   Final    BLOOD RIGHT HAND Performed at [facility], [address], [place], [place] [address]    Special Requests   Final    BOTTLES DRAWN AEROBIC AND ANAEROBIC Blood Culture adequate volume Performed at [facility], [address], [place], [place] [address]    Culture  Setup Time   Final    GRAM POSITIVE COCCI IN BOTH AEROBIC AND ANAEROBIC BOTTLES CRITICAL VALUE NOTED.  VALUE IS CONSISTENT WITH PREVIOUSLY REPORTED AND CALLED VALUE. Performed at [facility], [address] [place], [place] [address]    Culture (A)  Final    STAPHYLOCOCCUS SIMULANS SUSCEPTIBILITIES PERFORMED ON PREVIOUS CULTURE WITHIN THE LAST 5 DAYS. Performed at [facility], [place] [address], [place], [place] [address]    Report Status 03/01/2021 FINAL  Final  Culture, blood (Routine X 2) w Reflex to ID Panel     Status: Abnormal   Collection Time: 02/26/21  4:13 PM   Specimen: BLOOD  Result  Value Ref Range Status   Specimen Description   Final    BLOOD PORT Performed at [facility], [address] [place], [place] [address]    Special Requests   Final    BOTTLES DRAWN AEROBIC AND ANAEROBIC Blood Culture adequate volume Performed at [facility], [address] [place], [place] [address]    Culture  Setup Time   Final    GRAM POSITIVE COCCI IN BOTH AEROBIC AND ANAEROBIC BOTTLES Organism ID to follow CRITICAL RESULT CALLED TO, READ BACK BY AND VERIFIED WITH: Paulina Fusi [facility] 2505 02/27/21 HNM Performed at [facility], [address] [place], [place] [address]    Culture STAPHYLOCOCCUS SIMULANS (A)  Final   Report Status 03/01/2021 FINAL  Final   Organism ID, Bacteria STAPHYLOCOCCUS SIMULANS  Final      Susceptibility   Staphylococcus simulans - MIC*    CIPROFLOXACIN >=8 RESISTANT Resistant     ERYTHROMYCIN <=0.25 SENSITIVE Sensitive     GENTAMICIN <=0.5 SENSITIVE Sensitive     OXACILLIN >=4 RESISTANT Resistant     TETRACYCLINE <=1 SENSITIVE Sensitive     VANCOMYCIN <=0.5 SENSITIVE Sensitive     TRIMETH/SULFA <=10 SENSITIVE Sensitive     CLINDAMYCIN <=0.25 SENSITIVE Sensitive     RIFAMPIN <=0.5 SENSITIVE Sensitive     Inducible Clindamycin NEGATIVE Sensitive     * STAPHYLOCOCCUS SIMULANS  Blood Culture ID Panel (Reflexed)     Status: Abnormal   Collection Time: 02/26/21  4:13 PM  Result Value Ref Range Status   Enterococcus faecalis NOT DETECTED NOT DETECTED Final   Enterococcus Faecium NOT DETECTED NOT DETECTED Final   Listeria monocytogenes NOT DETECTED NOT DETECTED Final   Staphylococcus species DETECTED (A) NOT DETECTED Final    Comment: CRITICAL RESULT CALLED TO, READ BACK BY AND VERIFIED WITH: Paulina Fusi PHARMD [id number] 02/27/21 HNM    Staphylococcus aureus (BCID) NOT DETECTED NOT DETECTED Final   Staphylococcus epidermidis NOT DETECTED NOT DETECTED Final   Staphylococcus lugdunensis NOT DETECTED NOT DETECTED Final    Streptococcus species NOT DETECTED NOT DETECTED Final   Streptococcus agalactiae NOT DETECTED NOT DETECTED Final   Streptococcus pneumoniae NOT DETECTED NOT DETECTED Final   Streptococcus pyogenes NOT DETECTED NOT DETECTED Final   A.calcoaceticus-baumannii NOT DETECTED NOT DETECTED Final   Bacteroides fragilis NOT DETECTED NOT DETECTED Final   Enterobacterales NOT DETECTED NOT DETECTED Final   Enterobacter cloacae complex NOT DETECTED NOT DETECTED Final   Escherichia coli NOT DETECTED NOT DETECTED Final   Klebsiella aerogenes NOT DETECTED NOT DETECTED Final   Klebsiella oxytoca NOT DETECTED NOT DETECTED Final   Klebsiella pneumoniae NOT DETECTED NOT DETECTED Final   Proteus species NOT DETECTED NOT DETECTED Final   Salmonella species NOT DETECTED NOT DETECTED Final   Serratia marcescens NOT DETECTED NOT DETECTED Final   Haemophilus influenzae NOT DETECTED NOT DETECTED Final   Neisseria meningitidis NOT DETECTED NOT DETECTED Final   Pseudomonas aeruginosa NOT DETECTED NOT DETECTED Final   Stenotrophomonas maltophilia NOT DETECTED NOT DETECTED Final   Candida albicans NOT DETECTED NOT DETECTED Final   Candida auris NOT DETECTED NOT DETECTED Final   Candida glabrata NOT DETECTED NOT DETECTED Final   Candida krusei NOT DETECTED NOT DETECTED Final   Candida parapsilosis NOT DETECTED NOT DETECTED Final   Candida tropicalis NOT DETECTED NOT DETECTED Final   Cryptococcus neoformans/gattii NOT DETECTED NOT DETECTED Final    Comment: Performed at [facility], [address] [place], [place] [address]  CULTURE, BLOOD (ROUTINE X 2) w Reflex to ID Panel     Status: None (Preliminary result)   Collection Time: 02/27/21 10:53  AM   Specimen: BLOOD  Result Value Ref Range Status   Specimen Description BLOOD [facility]  Final   Special Requests   Final    BOTTLES DRAWN AEROBIC AND ANAEROBIC Blood Culture adequate volume   Culture   Final    NO GROWTH 2 DAYS Performed at [facility],  [address] [place], [place] [address]    Report Status PENDING  Incomplete  CULTURE, BLOOD (ROUTINE X 2) w Reflex to ID Panel     Status: None (Preliminary result)   Collection Time: 02/27/21 10:54 AM   Specimen: BLOOD  Result Value Ref Range Status   Specimen Description BLOOD [facility]  Final   Special Requests   Final    BOTTLES DRAWN AEROBIC AND ANAEROBIC Blood Culture adequate volume   Culture   Final    NO GROWTH 2 DAYS Performed at [facility], [address], [place], [place] [address]    Report Status PENDING  Incomplete    IMAGING RESULTS:  I have personally reviewed the films ?Ground-glass and nodular subtle airspace consolidation with lower lobe predominance throughout both lungs CT head shows Fluid in the right middle ear and large right mastoid effusion  There are new patchy infiltrates in the both lungs, more prominent in the right upper lung fields suggesting multifocal pneumonia.   Impression/Recommendation ? COVID resp illness- on remdisivr- b/l infiltrates likely due to that  H/o abd pain CT scan showed colitis , but no type 7 stool or frequent stool documented- DC zosyn  Staph hominis bacteremia on admission- repeat culture sent - started on vanco after that. Pt has a port- and she had staph hominis blood culture in sept 2022. This organism could be a contaminant or a true pathogen- if repeat culture is negative then will DC vanco.and follow repeat blood culture as OP after 2 weeks.  If the culture from 1/17  is positive then will need 2 d echo and possible TEE and PORT may have to be removed.  Hypothyroidism - on synthroid  Anemia  CKD  Fibromyalgia- she was on tramadol as OP but now getting diluadid/percocet  H/o B/L TKA  ? Rheumatoid arthritis- not on any meds ___________________________________________________ Discussed with her husband in detail

## 2021-03-01 NOTE — Progress Notes (Addendum)
PROGRESS NOTE    Adrienne Flores  HKV:[id number] DOB: May 31, 1965 DOA: 02/26/2021 PCP: [name], MD    Brief Narrative:  56 y.o. female with medical history significant of chronic normocytic anemia, chronic abdominal pain, fibromyalgia, chronic depression/anxiety, gastroparesis, hypothyroidism, rheumatoid arthritis who presented to the ED with complaints of abdominal pain, diarrhea, progressive headache of 2 days duration.  Associated with nonproductive cough, dyspnea with exertion. Work-up revealed, imaging significant for right ear otitis media and possible mastoiditis.  CTA chest negative for PE, consistent with possible COVID-pneumonia/bacterial pneumonia.  CT abdomen with diffuse colitis.  COVID-19 screening test positive on 02/26/2021.  She received IV antiviral, remdesivir, she is currently on Zosyn for intra-abdominal infection.  Patient continues to endorse abdominal pain.   03/01/2021: Patient was seen and examined at her bedside.  She is somnolent but easily arousable to voices.  Per bedside RN she has been very anxious at night.  As needed p.o. hydroxyzine added.  Assessment & Plan:   Principal Problem:   COVID-19 virus infection Active Problems:   Hypothyroidism   Colitis   Pneumonia due to COVID-19 virus   Mastoiditis   Headache  Treated COVID-19 infection Bilateral infiltrates, seen on chest x-ray and on CT chest Completed course of IV antiviral, remdesivir Currently on Zosyn which will cover for any pulmonary bacterial infection. Nonhypoxic at this time with O2 saturation of 96% on room air.  Staph simulans bacteremia, POA Blood cultures from 1/15 and 4/4 positive for staphylococcal species Currently on IV vancomycin Narrowed down to IV antibiotics after results of sensitivities. Infectious disease consulted to assist with the management. Patient has port and have this determined to be a true gram-positive infection we may need to remove the port Port has  been in place since November 2017 Monitor fever curve and WBC.  Pancolitis CT abdomen pelvis positive for diffuse colitis Associated abdominal pain and diarrhea C. difficile and GI PCR pending Plan: Ongoing empiric Zosyn Monitor vitals and fever curve Pain management in place.  Mastoiditis Headache Headache seems improved No indication for LP at this point Plan: Empiric antibiotics Follow cultures Monitor vitals and fever curve As needed headache treatment  Hypothyroidism PTA Synthroid  Chronic abdominal pain Fibromyalgia As needed narcotics patient reports severe abdominal pain.  Continues to request IV Dilaudid.  Educated on the importance of diagnostics and treatment  Hypokalemia Serum potassium 3.4 Repleted and repeat levels  Non anion gap metabolic acidosis Serum bicarb 19, anion gap 10 Continue gentle IV fluid hydration Repeat BMP.  Acute blood loss anemia Pancytopenia In the setting of acute illness WBC 3.9, hemoglobin 9.6, platelet count 144 Continue to closely monitor She has no overt bleeding Repeat CBC  Situational anxiety As needed p.o. hydroxyzine    DVT prophylaxis: SQ Lovenox daily Code Status: Full Family Communication: None at bedside. Disposition Plan: Status is: Inpatient  Remains inpatient appropriate because: Multiple acute issues.  COVID infection, multifocal pneumonia, mastoiditis, pancolitis.  On IV antibiotics.  Also possible bacteremia.  Disposition plan pending.       Level of care: Med-Surg  Consultants:  Infectious disease  Procedures:  None  Antimicrobials: Vancomycin Zosyn   Subjective: Patient seen and examined.  Remains very tearful.  Request IV Dilaudid.  Endorses abdominal pain  Objective: Vitals:   02/28/21 1700 02/28/21 2208 03/01/21 0522 03/01/21 0901  BP: 95/71 104/82 100/73 (!) 81/49  Pulse: 81 78 72 77  Resp: 19 18 17 17   Temp: 97.9 F (36.6 C) 98 F (36.7 C)  97.9 F (36.6 C) 98.3 F  (36.8 C)  TempSrc: Oral Oral Oral Oral  SpO2: 96% 97% 96% 94%  Weight:      Height:        Intake/Output Summary (Last 24 hours) at 03/01/2021 0905 Last data filed at 02/28/2021 1600 Gross per 24 hour  Intake 2276.9 ml  Output --  Net 2276.9 ml   Filed Weights   02/26/21 0808  Weight: 72.6 kg    Examination:  General exam: Well-developed well-nourished in no acute distress.  She is somnolent but easily arousable to voices.   Respiratory system: Mild rales at bases no wheezing noted.  Poor inspiratory effort.   Cardiovascular system: Regular rate and rhythm no rubs or gallops.   Gastrointestinal system: Soft nondistended bowel sounds present.   Central nervous system: Somnolent.   Extremities: No lower extremity edema bilaterally. Skin: Scattered excoriations in upper extremities bilaterally.   Psychiatry: Unable to assess mood due to hypersomnolence.   Data Reviewed: I have personally reviewed following labs and imaging studies  CBC: Recent Labs  Lab 02/26/21 0906 02/27/21 0615 02/28/21 0449  WBC 5.1 4.5 3.9*  NEUTROABS 3.8 3.1 2.4  HGB 12.2 11.4* 9.6*  HCT 36.3 33.2* 28.2*  MCV 87.9 88.1 89.2  PLT 162 174 382*   Basic Metabolic Panel: Recent Labs  Lab 02/26/21 0906 02/27/21 0615 02/28/21 0449  NA 131* 136 139  K 3.2* 3.4* 3.4*  CL 100 103 110  CO2 19* 22 19*  GLUCOSE 90 100* 64*  BUN 9 10 10   CREATININE 1.10* 1.34* 0.44  CALCIUM 8.2* 7.8* 7.6*   GFR: Estimated Creatinine Clearance: 70.7 mL/min (by C-G formula based on SCr of 0.44 mg/dL). Liver Function Tests: Recent Labs  Lab 02/26/21 0906 02/27/21 0615 02/28/21 0449  AST 48* 49* 41  ALT 19 17 11   ALKPHOS 125 115 70  BILITOT 1.0 1.0 0.7  PROT 7.4 7.0 5.7*  ALBUMIN 4.0 3.8 3.0*   Recent Labs  Lab 02/26/21 0906  LIPASE 26   No results for input(s): AMMONIA in the last 168 hours. Coagulation Profile: No results for input(s): INR, PROTIME in the last 168 hours. Cardiac Enzymes: No  results for input(s): CKTOTAL, CKMB, CKMBINDEX, TROPONINI in the last 168 hours. BNP (last 3 results) No results for input(s): PROBNP in the last 8760 hours. HbA1C: No results for input(s): HGBA1C in the last 72 hours. CBG: No results for input(s): GLUCAP in the last 168 hours. Lipid Profile: No results for input(s): CHOL, HDL, LDLCALC, TRIG, CHOLHDL, LDLDIRECT in the last 72 hours. Thyroid Function Tests: No results for input(s): TSH, T4TOTAL, FREET4, T3FREE, THYROIDAB in the last 72 hours. Anemia Panel: Recent Labs    02/26/21 1613  FERRITIN 1,886*   Sepsis Labs: Recent Labs  Lab 02/26/21 1613  PROCALCITON <0.10    Recent Results (from the past 240 hour(s))  Resp Panel by RT-PCR (Flu A&B, Covid) Nasopharyngeal Swab     Status: Abnormal   Collection Time: 02/26/21  1:20 PM   Specimen: Nasopharyngeal Swab; Nasopharyngeal(NP) swabs in vial transport medium  Result Value Ref Range Status   SARS Coronavirus 2 by RT PCR POSITIVE (A) NEGATIVE Final    Comment: (NOTE) SARS-CoV-2 target nucleic acids are DETECTED.  The SARS-CoV-2 RNA is generally detectable in upper respiratory specimens during the acute phase of infection. Positive results are indicative of the presence of the identified virus, but do not rule out bacterial infection or co-infection with other pathogens not detected by  the test. Clinical correlation with patient history and other diagnostic information is necessary to determine patient infection status. The expected result is Negative.  Fact Sheet for Patients: EntrepreneurPulse.com.au  Fact Sheet for Healthcare Providers: IncredibleEmployment.be  This test is not yet approved or cleared by the [place] FDA and  has been authorized for detection and/or diagnosis of SARS-CoV-2 by FDA under an Emergency Use Authorization (EUA).  This EUA will remain in effect (meaning this test can be used) for the duration of  the  COVID-19 declaration under Section 564(b)(1) of the A ct, 21 U.S.C. section 360bbb-3(b)(1), unless the authorization is terminated or revoked sooner.     Influenza A by PCR NEGATIVE NEGATIVE Final   Influenza B by PCR NEGATIVE NEGATIVE Final    Comment: (NOTE) The Xpert Xpress SARS-CoV-2/FLU/RSV plus assay is intended as an aid in the diagnosis of influenza from Nasopharyngeal swab specimens and should not be used as a sole basis for treatment. Nasal washings and aspirates are unacceptable for Xpert Xpress SARS-CoV-2/FLU/RSV testing.  Fact Sheet for Patients: EntrepreneurPulse.com.au  Fact Sheet for Healthcare Providers: IncredibleEmployment.be  This test is not yet approved or cleared by the [place] FDA and has been authorized for detection and/or diagnosis of SARS-CoV-2 by FDA under an Emergency Use Authorization (EUA). This EUA will remain in effect (meaning this test can be used) for the duration of the COVID-19 declaration under Section 564(b)(1) of the Act, 21 U.S.C. section 360bbb-3(b)(1), unless the authorization is terminated or revoked.  Performed at [facility], [address] [place], [place] [address]   Culture, blood (Routine X 2) w Reflex to ID Panel     Status: Abnormal (Preliminary result)   Collection Time: 02/26/21  4:13 PM   Specimen: BLOOD  Result Value Ref Range Status   Specimen Description   Final    BLOOD RIGHT HAND Performed at [facility], [address] [place], [place] [address]    Special Requests   Final    BOTTLES DRAWN AEROBIC AND ANAEROBIC Blood Culture adequate volume Performed at [facility], [address], [place], [place] [address]    Culture  Setup Time   Final    GRAM POSITIVE COCCI IN BOTH AEROBIC AND ANAEROBIC BOTTLES CRITICAL VALUE NOTED.  VALUE IS CONSISTENT WITH PREVIOUSLY REPORTED AND CALLED VALUE. Performed at [facility], [address], [place], [place] [address]    Culture STAPHYLOCOCCUS SIMULANS (A)  Final   Report Status PENDING  Incomplete  Culture, blood (Routine X 2) w Reflex to ID Panel     Status: Abnormal (Preliminary result)   Collection Time: 02/26/21  4:13 PM   Specimen: BLOOD  Result Value Ref Range Status   Specimen Description   Final    BLOOD PORT Performed at [facility], [address], [place], [place] [address]    Special Requests   Final    BOTTLES DRAWN AEROBIC AND ANAEROBIC Blood Culture adequate volume Performed at [facility], [address], [place], [place] [address]    Culture  Setup Time   Final    GRAM POSITIVE COCCI IN BOTH AEROBIC AND ANAEROBIC BOTTLES Organism ID to follow CRITICAL RESULT CALLED TO, READ BACK BY AND VERIFIED WITH: Paulina Fusi [facility] 0254 02/27/21 HNM Performed at [facility], [address], [place], [place] [address]    Culture (A)  Final    STAPHYLOCOCCUS SIMULANS SUSCEPTIBILITIES TO FOLLOW Performed at [facility], [place]  [address], [place], [place] [address]    Report Status PENDING  Incomplete  Blood Culture ID Panel (Reflexed)     Status: Abnormal   Collection Time: 02/26/21  4:13 PM  Result Value Ref Range Status   Enterococcus faecalis NOT DETECTED NOT DETECTED Final   Enterococcus Faecium NOT DETECTED NOT DETECTED Final   Listeria monocytogenes NOT DETECTED NOT DETECTED Final   Staphylococcus species DETECTED (A) NOT DETECTED Final    Comment: CRITICAL RESULT CALLED TO, READ BACK BY AND VERIFIED WITH: Paulina Fusi PHARMD [id number] 02/27/21 HNM    Staphylococcus aureus (BCID) NOT DETECTED NOT DETECTED Final   Staphylococcus epidermidis NOT DETECTED NOT DETECTED Final   Staphylococcus lugdunensis NOT DETECTED NOT DETECTED Final   Streptococcus species NOT DETECTED NOT DETECTED Final   Streptococcus agalactiae NOT DETECTED NOT DETECTED Final   Streptococcus pneumoniae NOT DETECTED NOT DETECTED Final   Streptococcus  pyogenes NOT DETECTED NOT DETECTED Final   A.calcoaceticus-baumannii NOT DETECTED NOT DETECTED Final   Bacteroides fragilis NOT DETECTED NOT DETECTED Final   Enterobacterales NOT DETECTED NOT DETECTED Final   Enterobacter cloacae complex NOT DETECTED NOT DETECTED Final   Escherichia coli NOT DETECTED NOT DETECTED Final   Klebsiella aerogenes NOT DETECTED NOT DETECTED Final   Klebsiella oxytoca NOT DETECTED NOT DETECTED Final   Klebsiella pneumoniae NOT DETECTED NOT DETECTED Final   Proteus species NOT DETECTED NOT DETECTED Final   Salmonella species NOT DETECTED NOT DETECTED Final   Serratia marcescens NOT DETECTED NOT DETECTED Final   Haemophilus influenzae NOT DETECTED NOT DETECTED Final   Neisseria meningitidis NOT DETECTED NOT DETECTED Final   Pseudomonas aeruginosa NOT DETECTED NOT DETECTED Final   Stenotrophomonas maltophilia NOT DETECTED NOT DETECTED Final   Candida albicans NOT DETECTED NOT DETECTED Final   Candida auris NOT DETECTED NOT DETECTED Final   Candida glabrata NOT DETECTED NOT DETECTED Final   Candida krusei NOT DETECTED NOT DETECTED Final   Candida parapsilosis NOT DETECTED NOT DETECTED Final   Candida tropicalis NOT DETECTED NOT DETECTED Final   Cryptococcus neoformans/gattii NOT DETECTED NOT DETECTED Final    Comment: Performed at [facility], [address] [place], [place] [address]  CULTURE, BLOOD (ROUTINE X 2) w Reflex to ID Panel     Status: None (Preliminary result)   Collection Time: 02/27/21 10:53 AM   Specimen: BLOOD  Result Value Ref Range Status   Specimen Description BLOOD [facility]  Final   Special Requests   Final    BOTTLES DRAWN AEROBIC AND ANAEROBIC Blood Culture adequate volume   Culture   Final    NO GROWTH 2 DAYS Performed at [facility], [address] [place], [place] [address]    Report Status PENDING  Incomplete  CULTURE, BLOOD (ROUTINE X 2) w Reflex to ID Panel     Status: None (Preliminary result)   Collection  Time: 02/27/21 10:54 AM   Specimen: BLOOD  Result Value Ref Range Status   Specimen Description BLOOD [facility]  Final   Special Requests   Final    BOTTLES DRAWN AEROBIC AND ANAEROBIC Blood Culture adequate volume   Culture   Final    NO GROWTH 2 DAYS Performed at [facility], [address] [place], [place] [address]    Report Status PENDING  Incomplete         Radiology Studies: DG Abd 1 View  Result Date: 02/28/2021 CLINICAL DATA:  Abdominal pain EXAM: ABDOMEN - 1 VIEW COMPARISON:  CT abdomen/pelvis 02/26/2021,  KUB 05/07/2016 FINDINGS: There is a nonobstructive bowel gas pattern. There is no abnormal soft tissue calcification. There is no definite free intraperitoneal air, within the confines of supine technique. Cholecystectomy clips are noted. A clip is also noted in the pelvis. Density in the bladder may reflect excretion of intravenous contrast from a prior imaging study. There is no acute osseous abnormality. IMPRESSION: Nonobstructive bowel gas pattern.  No acute finding. Electronically Signed   By: Valetta Mole M.D.   On: 02/28/2021 12:06        Scheduled Meds:  vitamin C  500 mg Oral Daily   atorvastatin  40 mg Oral Daily   Chlorhexidine Gluconate Cloth  6 each Topical Daily   enoxaparin (LOVENOX) injection  40 mg Subcutaneous Q24H   famotidine  20 mg Oral BID   folic acid  1 mg Oral Daily   gabapentin  300 mg Oral TID   levothyroxine  100 mcg Oral QAC breakfast   multivitamin with minerals  1 tablet Oral Daily   sodium chloride flush  3 mL Intravenous Q12H   thiamine  100 mg Oral Daily   tiZANidine  4 mg Oral QHS   vitamin B-12  1,000 mcg Oral Daily   zinc sulfate  220 mg Oral Daily   Continuous Infusions:  sodium chloride     0.9 % NaCl with KCl 20 mEq / L 75 mL/hr at 02/28/21 1438   piperacillin-tazobactam (ZOSYN)  IV 3.375 g (03/01/21 5217)   vancomycin 1,250 mg (02/28/21 1250)     LOS: 3 days    Time spent: 35 minutes    Kayleen Memos,  MD [facility]   If 7PM-7AM, please contact night-coverage  03/01/2021, 9:05 AM

## 2021-03-01 NOTE — Progress Notes (Signed)
Pharmacy Antibiotic Note  Adrienne Flores is a 56 y.o. female admitted on 02/26/2021 with bacteremia.  Pharmacy has been consulted for vancomycin dosing. Patient presented with abdominal pain, headache and diarrhea.  She has a port due to being hard stick, appears port place in Nov 2017. She has rheumatoid arthritis but has not seen specialist in years (do not see that she is on immunomodulatory therapy).  She has PMH of chronic renal failure as well  COVID - Positive 1/15 Bcx: 4/4 bottles S. Simulans (methicillin resistant) 1/16 Bcx - NG x2 days  C difficle and GI pending ordered (not collected)  Blood cultures from [facility]  10/16 S. Simulans (methicillin resistant)  CT abdomen: mild to mod colitis of ascending, transverse and descending colon CT head: R middle ear fluid, Large R mastoid effusion   Plan: Continue vancomycin for S. Simulans (methicillin resistant) in 4/4 blood cx in setting of port and history of S simulans bacteremia in Oct 2022.   Vancomycin 1500mg  IV x 1 loading dose Will decrease maintenance dose to 1000 mg IV q24h - Dose based on patient receiving 1250mg  IV q24h at [facility] in Oct with at VT = 18 w/ SCr 1.23 Estimated AUC 573, est Cmin 12.1 using SCr 16.1 Goal AUC 400-600 Monitor renal function closely. Check levels if remains on vanco >4 days or PRN for changes in renal function Follow-up identification of 2/35 blood cx and result of blood cx drawn today  Height: 5\' 1"  (154.9 cm) Weight: 72.6 kg (160 lb) IBW/kg (Calculated) : 47.8  Temp (24hrs), Avg:97.9 F (36.6 C), Min:97.6 F (36.4 C), Max:98.3 F (36.8 C)  Recent Labs  Lab 02/26/21 0906 02/27/21 0615 02/28/21 0449 03/01/21 0913 03/01/21 1042  WBC 5.1 4.5 3.9*  --  4.1  CREATININE 1.10* 1.34* 0.44 1.38* 1.32*     Estimated Creatinine Clearance: 42.8 mL/min (A) (by C-G formula based on SCr of 1.32 mg/dL (H)).    Allergies  Allergen Reactions   Morphine Hives   Morphine And Related Hives and Itching     ONLY MORPHINE, tolerates Dilaudid, oxycodone, and Norco    Methotrexate Derivatives Other (See Comments)    Headaches    Nabumetone Other (See Comments)    Headaches   Methotrexate Other (See Comments)    Headaches     Haloperidol Nausea Only and Other (See Comments)    Patient stated that she broke out in cold sweats, also    Antimicrobials this admission: 1/15 doxy x1 1/15 pip/tazo >>1/18 1/16 vanco >>  Dose adjustments this admission:   Microbiology results: 1/16 Bcx: 4/4 GPC, BCID staph spp  Thank you for allowing pharmacy to be a part of this patients care.  Darnelle Bos, PharmD 03/01/2021 3:59 PM

## 2021-03-02 DIAGNOSIS — M797 Fibromyalgia: Secondary | ICD-10-CM

## 2021-03-02 DIAGNOSIS — M069 Rheumatoid arthritis, unspecified: Secondary | ICD-10-CM

## 2021-03-02 DIAGNOSIS — E039 Hypothyroidism, unspecified: Secondary | ICD-10-CM

## 2021-03-02 LAB — CBC WITH DIFFERENTIAL/PLATELET
Abs Immature Granulocytes: 0.45 10*3/uL — ABNORMAL HIGH (ref 0.00–0.07)
Basophils Absolute: 0.1 10*3/uL (ref 0.0–0.1)
Basophils Relative: 1 %
Eosinophils Absolute: 0.1 10*3/uL (ref 0.0–0.5)
Eosinophils Relative: 1 %
HCT: 28.8 % — ABNORMAL LOW (ref 36.0–46.0)
Hemoglobin: 9.8 g/dL — ABNORMAL LOW (ref 12.0–15.0)
Immature Granulocytes: 7 %
Lymphocytes Relative: 16 %
Lymphs Abs: 1.1 10*3/uL (ref 0.7–4.0)
MCH: 30.2 pg (ref 26.0–34.0)
MCHC: 34 g/dL (ref 30.0–36.0)
MCV: 88.9 fL (ref 80.0–100.0)
Monocytes Absolute: 0.4 10*3/uL (ref 0.1–1.0)
Monocytes Relative: 7 %
Neutro Abs: 4.7 10*3/uL (ref 1.7–7.7)
Neutrophils Relative %: 68 %
Platelets: 185 10*3/uL (ref 150–400)
RBC: 3.24 MIL/uL — ABNORMAL LOW (ref 3.87–5.11)
RDW: 17.5 % — ABNORMAL HIGH (ref 11.5–15.5)
Smear Review: NORMAL
WBC: 6.8 10*3/uL (ref 4.0–10.5)
nRBC: 0.7 % — ABNORMAL HIGH (ref 0.0–0.2)

## 2021-03-02 LAB — COMPREHENSIVE METABOLIC PANEL
ALT: 15 U/L (ref 0–44)
AST: 48 U/L — ABNORMAL HIGH (ref 15–41)
Albumin: 3.2 g/dL — ABNORMAL LOW (ref 3.5–5.0)
Alkaline Phosphatase: 123 U/L (ref 38–126)
Anion gap: 9 (ref 5–15)
BUN: 7 mg/dL (ref 6–20)
CO2: 16 mmol/L — ABNORMAL LOW (ref 22–32)
Calcium: 7.8 mg/dL — ABNORMAL LOW (ref 8.9–10.3)
Chloride: 116 mmol/L — ABNORMAL HIGH (ref 98–111)
Creatinine, Ser: 1.19 mg/dL — ABNORMAL HIGH (ref 0.44–1.00)
GFR, Estimated: 53 mL/min — ABNORMAL LOW (ref >60)
Glucose, Bld: 125 mg/dL — ABNORMAL HIGH (ref 70–99)
Potassium: 4 mmol/L (ref 3.5–5.1)
Sodium: 141 mmol/L (ref 135–145)
Total Bilirubin: 0.9 mg/dL (ref 0.3–1.2)
Total Protein: 5.8 g/dL — ABNORMAL LOW (ref 6.5–8.1)

## 2021-03-02 LAB — MAGNESIUM: Magnesium: 1.7 mg/dL (ref 1.7–2.4)

## 2021-03-02 LAB — PHOSPHORUS: Phosphorus: 1.2 mg/dL — ABNORMAL LOW (ref 2.5–4.6)

## 2021-03-02 LAB — CORTISOL-AM, BLOOD: Cortisol - AM: 11.4 ug/dL (ref 6.7–22.6)

## 2021-03-02 LAB — D-DIMER, QUANTITATIVE: D-Dimer, Quant: 0.56 ug/mL-FEU — ABNORMAL HIGH (ref 0.00–0.50)

## 2021-03-02 LAB — TSH: TSH: 44 u[IU]/mL — ABNORMAL HIGH (ref 0.350–4.500)

## 2021-03-02 LAB — C-REACTIVE PROTEIN: CRP: 2.6 mg/dL — ABNORMAL HIGH (ref <1.0)

## 2021-03-02 LAB — T4, FREE: Free T4: <0.25 ng/dL — ABNORMAL LOW (ref 0.61–1.12)

## 2021-03-02 MED ORDER — HYDROMORPHONE HCL 1 MG/ML IJ SOLN: 0.5000 mg | INTRAMUSCULAR | Status: DC | PRN

## 2021-03-02 MED ORDER — SODIUM BICARBONATE 650 MG PO TABS
1300.0000 mg | ORAL_TABLET | Freq: Four times a day (QID) | ORAL | Status: DC
  Filled 2021-03-02 (×2): qty 2

## 2021-03-02 MED ORDER — POTASSIUM CHLORIDE IN NACL 20-0.9 MEQ/L-% IV SOLN
INTRAVENOUS | Status: DC
  Filled 2021-03-02 (×3): qty 1000

## 2021-03-02 MED ORDER — POTASSIUM PHOSPHATES 15 MMOLE/5ML IV SOLN
30.0000 mmol | INTRAVENOUS | Status: AC
  Administered 2021-03-02: 18:00:00 30 mmol via INTRAVENOUS
  Filled 2021-03-02: qty 10

## 2021-03-02 MED ORDER — SODIUM BICARBONATE 650 MG PO TABS
1300.0000 mg | ORAL_TABLET | Freq: Four times a day (QID) | ORAL | Status: AC
  Administered 2021-03-02 – 2021-03-04 (×8): 1300 mg via ORAL
  Filled 2021-03-02 (×8): qty 2

## 2021-03-02 MED ORDER — AMOXICILLIN-POT CLAVULANATE 875-125 MG PO TABS
1.0000 | ORAL_TABLET | Freq: Two times a day (BID) | ORAL | Status: DC
Start: 2021-03-02 — End: 2021-03-02

## 2021-03-02 MED ORDER — POTASSIUM CHLORIDE CRYS ER 20 MEQ PO TBCR
40.0000 meq | EXTENDED_RELEASE_TABLET | Freq: Once | ORAL | Status: AC
  Administered 2021-03-02: 40 meq via ORAL
  Filled 2021-03-02: qty 2

## 2021-03-02 MED ORDER — VANCOMYCIN HCL IN DEXTROSE 1-5 GM/200ML-% IV SOLN
1000.0000 mg | INTRAVENOUS | Status: DC
  Administered 2021-03-03 – 2021-03-04 (×2): 1000 mg via INTRAVENOUS
  Filled 2021-03-02 (×3): qty 200

## 2021-03-02 MED ORDER — VANCOMYCIN HCL 1000 MG/200ML IV SOLN
1000.0000 mg | INTRAVENOUS | Status: DC
  Administered 2021-03-02: 1000 mg via INTRAVENOUS
  Filled 2021-03-02: qty 200

## 2021-03-02 MED ORDER — POTASSIUM CHLORIDE CRYS ER 20 MEQ PO TBCR: 40.0000 meq | EXTENDED_RELEASE_TABLET | Freq: Two times a day (BID) | ORAL | Status: DC

## 2021-03-02 NOTE — Progress Notes (Signed)
PROGRESS NOTE    Adrienne Flores  UMP:[id number] DOB: 1965/04/02 DOA: 02/26/2021 PCP: [name], MD    Brief Narrative:  56 y.o. female with medical history significant of chronic normocytic anemia, chronic abdominal pain, fibromyalgia, chronic depression/anxiety, gastroparesis, hypothyroidism, rheumatoid arthritis who presented to [facility] ED with complaints of abdominal pain, diarrhea, progressive headache of 2 days duration.  Associated with nonproductive cough, dyspnea with exertion.  COVID-19 screening test positive on 02/26/2021.  Work-up revealed, right ear otitis media and possible mastoiditis.  Possible COVID-19 viral pneumonia with coinfection bacterial pneumonia.  Pancolitis on CT scan.    She received IV antiviral, remdesivir, and IV antibiotics Zosyn for possible intra-abdominal infection.  Blood cultures drawn on 02/26/2021 positive for staph simulans methicillin-resistant 4 out of 4 bottles.  Repeated blood cultures taken on 02/27/2021 negative to date.  Infectious disease assisting with the management.  Patient is currently on IV vancomycin for possible staph simulans methicillin-resistant Bacteremia.    TTE held, per ID this organism could be a contaminant or a true pathogen.  If repeat culture is negative then will DC IV vancomycin and follow repeat blood cultures as outpatient after 2 weeks.  If blood culture from 02/27/2021 is positive then will need TTE and possible TEE and port may have to be removed.  Hospital course complicated by noncompliance with medical management.  Also complicated by lethargy associated with frequent uses of IV Dilaudid.  Per bedside nursing staff patient has been constantly asking for IV Dilaudid which causes her to become lethargic.  She has refused treatments and lab draws unless she is given what she wants.  IV Dilaudid discontinued on 03/02/2021 for patient's home safety.    03/02/2021: Patient was seen at her bedside this morning.  Her person was  present in the room.  She has no new complaints.  Assessment & Plan:   Principal Problem:   COVID-19 virus infection Active Problems:   Hypothyroidism   Colitis   Pneumonia due to COVID-19 virus   Mastoiditis   Headache  Treated COVID-19 infection Bilateral infiltrates, seen on chest x-ray and on CT chest Completed course of IV antiviral, remdesivir Completed course of Zosyn which covered for any pulmonary bacterial infection. Not hypoxic, with oxygen saturation 100% on room air.  Staph simulans methicillin-resistant bacteremia, POA Blood cultures from 02/26/21 4/4 positive for staph simulans, resistant to ciprofloxacin and oxacillin Continue IV vancomycin Infectious disease assisting with the management. More details on plan under brief narrative.  Pancolitis CT abdomen pelvis positive for diffuse colitis Stable, received IV Zosyn, DC'd on 03/01/2021. Afebrile with no leukocytosis. Nonseptic appearing.  Mastoiditis Headache Headache seems improved No indication for LP at this point Received empiric antibiotics.  Hypothyroidism PTA Synthroid  Chronic abdominal pain Fibromyalgia Minimize opiate use IV Dilaudid discontinued on 03/02/2021 for patient's own safety.  Hypokalemia, patient refusing to take her medications and refusing lab draws. Serum potassium 3.4> 3.2> No lab draws on 03/02/2021, patient refused.  Non anion gap metabolic acidosis Serum bicarb 19, anion gap 10 Patient refuses lab draws  Acute blood loss anemia Pancytopenia is improving, WBC 4.1 from 3.9. As hemoglobin 8.5 from 9.6. Last platelet 128 from 144 Patient refuses lab draws on 03/02/2021. Will continue to monitor  Situational anxiety As needed p.o. hydroxyzine Avoid benzodiazepine  Physical debility PT OT to assess Fall precautions  Critical care time: 65 minutes    DVT prophylaxis: SQ Lovenox daily Code Status: Full Family Communication: Husband at bedside.  Disposition  Plan: Status  is: Inpatient  Remains inpatient appropriate because: Multiple acute issues.  COVID infection, multifocal pneumonia, mastoiditis, pancolitis.  On IV antibiotics.  Also possible bacteremia.  Disposition plan pending.   Plan to DC to home when infectious disease signs off.    Level of care: Med-Surg  Consultants:  Infectious disease  Procedures:  None  Antimicrobials: Vancomycin Zosyn     Objective: Vitals:   03/01/21 2053 03/01/21 2342 03/02/21 0325 03/02/21 0928  BP: (!) 89/63 103/70 129/80 132/77  Pulse: 82 84 84 87  Resp:  17 16 18   Temp:  97.9 F (36.6 C) 98 F (36.7 C) 97.8 F (36.6 C)  TempSrc:  Oral Oral Oral  SpO2: 94% 92% 98% 100%  Weight:      Height:       No intake or output data in the 24 hours ending 03/02/21 1036  Filed Weights   02/26/21 0808  Weight: 72.6 kg    Examination:  General exam: Well-developed well-nourished in no acute distress.  She is somnolent but easily arousable to voices. Respiratory system: Clear to auscultation with no wheezes or rales.  Poor inspiratory effort  cardiovascular system: Regular rate and rhythm no rubs or gallops.   Gastrointestinal system: Soft nontender normal bowel sounds present.   Central nervous system: Somnolent moves all 4 extremities.  Extremities: No lower extremity edema bilaterally. Skin: Scattered discolorations to her upper extremities bilaterally. Psychiatry: Mood is appropriate for condition and setting.  Data Reviewed: I have personally reviewed following labs and imaging studies  CBC: Recent Labs  Lab 02/26/21 0906 02/27/21 0615 02/28/21 0449 03/01/21 1042  WBC 5.1 4.5 3.9* 4.1  NEUTROABS 3.8 3.1 2.4  --   HGB 12.2 11.4* 9.6* 8.5*  HCT 36.3 33.2* 28.2* 25.2*  MCV 87.9 88.1 89.2 91.3  PLT 162 174 144* 224*   Basic Metabolic Panel: Recent Labs  Lab 02/26/21 0906 02/27/21 0615 02/28/21 0449 03/01/21 0913 03/01/21 1042  NA 131* 136 139  --  138  K 3.2* 3.4*  3.4*  --  3.2*  CL 100 103 110  --  115*  CO2 19* 22 19*  --  19*  GLUCOSE 90 100* 64*  --  99  BUN 9 10 10   --  10  CREATININE 1.10* 1.34* 0.44 1.38* 1.32*  CALCIUM 8.2* 7.8* 7.6*  --  7.2*  MG  --   --   --   --  1.9  PHOS  --   --   --   --  1.3*   GFR: Estimated Creatinine Clearance: 42.8 mL/min (A) (by C-G formula based on SCr of 1.32 mg/dL (H)). Liver Function Tests: Recent Labs  Lab 02/26/21 0906 02/27/21 0615 02/28/21 0449  AST 48* 49* 41  ALT 19 17 11   ALKPHOS 125 115 70  BILITOT 1.0 1.0 0.7  PROT 7.4 7.0 5.7*  ALBUMIN 4.0 3.8 3.0*   Recent Labs  Lab 02/26/21 0906  LIPASE 26   No results for input(s): AMMONIA in the last 168 hours. Coagulation Profile: No results for input(s): INR, PROTIME in the last 168 hours. Cardiac Enzymes: No results for input(s): CKTOTAL, CKMB, CKMBINDEX, TROPONINI in the last 168 hours. BNP (last 3 results) No results for input(s): PROBNP in the last 8760 hours. HbA1C: No results for input(s): HGBA1C in the last 72 hours. CBG: Recent Labs  Lab 03/01/21 1656 03/01/21 1702 03/01/21 1706 03/01/21 1718 03/01/21 1733  GLUCAP 53* 55* 63* 69* 77   Lipid Profile: No results  for input(s): CHOL, HDL, LDLCALC, TRIG, CHOLHDL, LDLDIRECT in the last 72 hours. Thyroid Function Tests: No results for input(s): TSH, T4TOTAL, FREET4, T3FREE, THYROIDAB in the last 72 hours. Anemia Panel: No results for input(s): VITAMINB12, FOLATE, FERRITIN, TIBC, IRON, RETICCTPCT in the last 72 hours.  Sepsis Labs: Recent Labs  Lab 02/26/21 1613 03/01/21 0913  PROCALCITON <0.10 <0.10    Recent Results (from the past 240 hour(s))  Resp Panel by RT-PCR (Flu A&B, Covid) Nasopharyngeal Swab     Status: Abnormal   Collection Time: 02/26/21  1:20 PM   Specimen: Nasopharyngeal Swab; Nasopharyngeal(NP) swabs in vial transport medium  Result Value Ref Range Status   SARS Coronavirus 2 by RT PCR POSITIVE (A) NEGATIVE Final    Comment: (NOTE) SARS-CoV-2  target nucleic acids are DETECTED.  The SARS-CoV-2 RNA is generally detectable in upper respiratory specimens during the acute phase of infection. Positive results are indicative of the presence of the identified virus, but do not rule out bacterial infection or co-infection with other pathogens not detected by the test. Clinical correlation with patient history and other diagnostic information is necessary to determine patient infection status. The expected result is Negative.  Fact Sheet for Patients: EntrepreneurPulse.com.au  Fact Sheet for Healthcare Providers: IncredibleEmployment.be  This test is not yet approved or cleared by the [place] FDA and  has been authorized for detection and/or diagnosis of SARS-CoV-2 by FDA under an Emergency Use Authorization (EUA).  This EUA will remain in effect (meaning this test can be used) for the duration of  the COVID-19 declaration under Section 564(b)(1) of the A ct, 21 U.S.C. section 360bbb-3(b)(1), unless the authorization is terminated or revoked sooner.     Influenza A by PCR NEGATIVE NEGATIVE Final   Influenza B by PCR NEGATIVE NEGATIVE Final    Comment: (NOTE) The Xpert Xpress SARS-CoV-2/FLU/RSV plus assay is intended as an aid in the diagnosis of influenza from Nasopharyngeal swab specimens and should not be used as a sole basis for treatment. Nasal washings and aspirates are unacceptable for Xpert Xpress SARS-CoV-2/FLU/RSV testing.  Fact Sheet for Patients: EntrepreneurPulse.com.au  Fact Sheet for Healthcare Providers: IncredibleEmployment.be  This test is not yet approved or cleared by the [place] FDA and has been authorized for detection and/or diagnosis of SARS-CoV-2 by FDA under an Emergency Use Authorization (EUA). This EUA will remain in effect (meaning this test can be used) for the duration of the COVID-19 declaration under Section  564(b)(1) of the Act, 21 U.S.C. section 360bbb-3(b)(1), unless the authorization is terminated or revoked.  Performed at [facility], [address] [place], [place] [address]   Culture, blood (Routine X 2) w Reflex to ID Panel     Status: Abnormal   Collection Time: 02/26/21  4:13 PM   Specimen: BLOOD  Result Value Ref Range Status   Specimen Description   Final    BLOOD RIGHT HAND Performed at [facility], [address], [place], [place] [address]    Special Requests   Final    BOTTLES DRAWN AEROBIC AND ANAEROBIC Blood Culture adequate volume Performed at [facility], [address] [place], [place] [address]    Culture  Setup Time   Final    GRAM POSITIVE COCCI IN BOTH AEROBIC AND ANAEROBIC BOTTLES CRITICAL VALUE NOTED.  VALUE IS CONSISTENT WITH PREVIOUSLY REPORTED AND CALLED VALUE. Performed at [facility], [address] [place], [place] [address]    Culture (A)  Final  STAPHYLOCOCCUS SIMULANS SUSCEPTIBILITIES PERFORMED ON PREVIOUS CULTURE WITHIN THE LAST 5 DAYS. Performed at [facility], [place] [address], [place], [place] [address]    Report Status 03/01/2021 FINAL  Final  Culture, blood (Routine X 2) w Reflex to ID Panel     Status: Abnormal   Collection Time: 02/26/21  4:13 PM   Specimen: BLOOD  Result Value Ref Range Status   Specimen Description   Final    BLOOD PORT Performed at [facility], [address] [place], [place] [address]    Special Requests   Final    BOTTLES DRAWN AEROBIC AND ANAEROBIC Blood Culture adequate volume Performed at [facility], [address] [place], [place] [address]    Culture  Setup Time   Final    GRAM POSITIVE COCCI IN BOTH AEROBIC AND ANAEROBIC BOTTLES Organism ID to follow CRITICAL RESULT CALLED TO, READ BACK BY AND VERIFIED WITH: Paulina Fusi [facility] [id number] 02/27/21 HNM Performed at [facility], [address] [place], [place] [address]     Culture STAPHYLOCOCCUS SIMULANS (A)  Final   Report Status 03/01/2021 FINAL  Final   Organism ID, Bacteria STAPHYLOCOCCUS SIMULANS  Final      Susceptibility   Staphylococcus simulans - MIC*    CIPROFLOXACIN >=8 RESISTANT Resistant     ERYTHROMYCIN <=0.25 SENSITIVE Sensitive     GENTAMICIN <=0.5 SENSITIVE Sensitive     OXACILLIN >=4 RESISTANT Resistant     TETRACYCLINE <=1 SENSITIVE Sensitive     VANCOMYCIN <=0.5 SENSITIVE Sensitive     TRIMETH/SULFA <=10 SENSITIVE Sensitive     CLINDAMYCIN <=0.25 SENSITIVE Sensitive     RIFAMPIN <=0.5 SENSITIVE Sensitive     Inducible Clindamycin NEGATIVE Sensitive     * STAPHYLOCOCCUS SIMULANS  Blood Culture ID Panel (Reflexed)     Status: Abnormal   Collection Time: 02/26/21  4:13 PM  Result Value Ref Range Status   Enterococcus faecalis NOT DETECTED NOT DETECTED Final   Enterococcus Faecium NOT DETECTED NOT DETECTED Final   Listeria monocytogenes NOT DETECTED NOT DETECTED Final   Staphylococcus species DETECTED (A) NOT DETECTED Final    Comment: CRITICAL RESULT CALLED TO, READ BACK BY AND VERIFIED WITH: Paulina Fusi PHARMD [id number] 02/27/21 HNM    Staphylococcus aureus (BCID) NOT DETECTED NOT DETECTED Final   Staphylococcus epidermidis NOT DETECTED NOT DETECTED Final   Staphylococcus lugdunensis NOT DETECTED NOT DETECTED Final   Streptococcus species NOT DETECTED NOT DETECTED Final   Streptococcus agalactiae NOT DETECTED NOT DETECTED Final   Streptococcus pneumoniae NOT DETECTED NOT DETECTED Final   Streptococcus pyogenes NOT DETECTED NOT DETECTED Final   A.calcoaceticus-baumannii NOT DETECTED NOT DETECTED Final   Bacteroides fragilis NOT DETECTED NOT DETECTED Final   Enterobacterales NOT DETECTED NOT DETECTED Final   Enterobacter cloacae complex NOT DETECTED NOT DETECTED Final   Escherichia coli NOT DETECTED NOT DETECTED Final   Klebsiella aerogenes NOT DETECTED NOT DETECTED Final   Klebsiella oxytoca NOT DETECTED NOT DETECTED Final    Klebsiella pneumoniae NOT DETECTED NOT DETECTED Final   Proteus species NOT DETECTED NOT DETECTED Final   Salmonella species NOT DETECTED NOT DETECTED Final   Serratia marcescens NOT DETECTED NOT DETECTED Final   Haemophilus influenzae NOT DETECTED NOT DETECTED Final   Neisseria meningitidis NOT DETECTED NOT DETECTED Final   Pseudomonas aeruginosa NOT DETECTED NOT DETECTED Final   Stenotrophomonas maltophilia NOT DETECTED NOT DETECTED Final   Candida albicans NOT DETECTED NOT DETECTED Final   Candida auris NOT  DETECTED NOT DETECTED Final   Candida glabrata NOT DETECTED NOT DETECTED Final   Candida krusei NOT DETECTED NOT DETECTED Final   Candida parapsilosis NOT DETECTED NOT DETECTED Final   Candida tropicalis NOT DETECTED NOT DETECTED Final   Cryptococcus neoformans/gattii NOT DETECTED NOT DETECTED Final    Comment: Performed at [facility], [address] [place], [place] [address]  CULTURE, BLOOD (ROUTINE X 2) w Reflex to ID Panel     Status: None (Preliminary result)   Collection Time: 02/27/21 10:53 AM   Specimen: BLOOD  Result Value Ref Range Status   Specimen Description BLOOD [facility]  Final   Special Requests   Final    BOTTLES DRAWN AEROBIC AND ANAEROBIC Blood Culture adequate volume   Culture   Final    NO GROWTH 3 DAYS Performed at [facility], [address], [place], [place] [address]    Report Status PENDING  Incomplete  CULTURE, BLOOD (ROUTINE X 2) w Reflex to ID Panel     Status: None (Preliminary result)   Collection Time: 02/27/21 10:54 AM   Specimen: BLOOD  Result Value Ref Range Status   Specimen Description BLOOD [facility]  Final   Special Requests   Final    BOTTLES DRAWN AEROBIC AND ANAEROBIC Blood Culture adequate volume   Culture   Final    NO GROWTH 3 DAYS Performed at [facility], [address], [place], [place] [address]    Report Status PENDING  Incomplete         Radiology Studies: DG Abd 1 View  Result Date:  02/28/2021 CLINICAL DATA:  Abdominal pain EXAM: ABDOMEN - 1 VIEW COMPARISON:  CT abdomen/pelvis 02/26/2021, KUB 05/07/2016 FINDINGS: There is a nonobstructive bowel gas pattern. There is no abnormal soft tissue calcification. There is no definite free intraperitoneal air, within the confines of supine technique. Cholecystectomy clips are noted. A clip is also noted in the pelvis. Density in the bladder may reflect excretion of intravenous contrast from a prior imaging study. There is no acute osseous abnormality. IMPRESSION: Nonobstructive bowel gas pattern.  No acute finding. Electronically Signed   By: Valetta Mole M.D.   On: 02/28/2021 12:06        Scheduled Meds:  vitamin C  500 mg Oral Daily   atorvastatin  40 mg Oral Daily   Chlorhexidine Gluconate Cloth  6 each Topical Daily   enoxaparin (LOVENOX) injection  40 mg Subcutaneous Q24H   famotidine  20 mg Oral BID   folic acid  1 mg Oral Daily   gabapentin  300 mg Oral TID   levothyroxine  100 mcg Oral QAC breakfast   multivitamin with minerals  1 tablet Oral Daily   sodium chloride flush  3 mL Intravenous Q12H   thiamine  100 mg Oral Daily   tiZANidine  4 mg Oral QHS   vitamin B-12  1,000 mcg Oral Daily   zinc sulfate  220 mg Oral Daily   Continuous Infusions:  sodium chloride     0.9 % NaCl with KCl 20 mEq / L 75 mL/hr at 03/01/21 1758   vancomycin       LOS: 4 days    Time spent: 35 minutes    Kayleen Memos, MD [facility]   If 7PM-7AM, please contact night-coverage  03/02/2021, 10:36 AM

## 2021-03-02 NOTE — Evaluation (Signed)
Occupational Therapy Evaluation Patient Details Name: Adrienne Flores MRN: [id number] DOB: 1965/03/31 Today's Date: 03/02/2021   History of Present Illness 56 y.o. female with medical history significant of chronic normocytic anemia, chronic abdominal pain, fibromyalgia, chronic depression/anxiety, gastroparesis, hypothyroidism, rheumatoid arthritis who presented to [facility] ED with complaints of abdominal pain, diarrhea, progressive headache of 2 days duration.  Associated with nonproductive cough, dyspnea with exertion.  COVID-19 screening test positive on 02/26/2021. Work-up revealed, imaging significant for right ear otitis media and possible mastoiditis.  CTA chest negative for PE, consistent with possible COVID-pneumonia/bacterial pneumonia.  CT abdomen with diffuse colitis.  COVID-19 screening test positive on 02/26/2021.  She received IV antiviral, remdesivir, she is currently on Zosyn for intra-abdominal infection.   Clinical Impression   Pt seen for OT evaluation this date in setting of acute hospitalization d/t COVID. She presents this date with primary c/o pain in RLQ and lower back/bottom. RN aware. She presents this date with decreased fxl activity tolerance. She currently requires: SETUP for seated UB ADLs. MIN A for seated LB ADLs, CGA for ADL transfers with RW, progressing to SUPV. OT returns pt to bed end of session after performing light standing g/h task with SETUP/SUPV with RW support. Pt requires MIN A for in-bed repositioning d/t pain. OT ed with pt re: non-medication pain techniques such as breathing exercises. Pt left with all needs met and in reach at end of session. Will continue to follow acutely. Anticipate pt could benefit from [facility] f/u services to improve balance for standing self care as well as improve tolerance for seated LB ADLs versus offering task adaptations as needed.      Recommendations for follow up therapy are one component of a multi-disciplinary discharge  planning process, led by the attending physician.  Recommendations may be updated based on patient status, additional functional criteria and insurance authorization.   Follow Up Recommendations  Home health OT    Assistance Recommended at Discharge Intermittent Supervision/Assistance  Patient can return home with the following A little help with bathing/dressing/bathroom;Assistance with cooking/housework;Assist for transportation    Functional Status Assessment  Patient has had a recent decline in their functional status and demonstrates the ability to make significant improvements in function in a reasonable and predictable amount of time.  Equipment Recommendations  BSC/3in1    Recommendations for Other Services       Precautions / Restrictions Precautions Precaution Comments: mod fall Restrictions Weight Bearing Restrictions: No      Mobility Bed Mobility Overal bed mobility: Modified Independent             General bed mobility comments: performed bed mobility with mod independence    Transfers Overall transfer level: Needs assistance Equipment used: Rolling walker (2 wheels) Transfers: Sit to/from Stand Sit to Stand: Min guard, Supervision           General transfer comment: cues for safety      Balance Overall balance assessment: Mild deficits observed, not formally tested   Sitting balance-Leahy Scale: Good       Standing balance-Leahy Scale: Fair                             ADL either performed or assessed with clinical judgement   ADL  General ADL Comments: SETUP for seated UB ADLs. MIN A for seated LB ADLs, CGA for ADL transfers with RW, progressing to SUPV.     Vision Patient Visual Report: No change from baseline       Perception     Praxis      Pertinent Vitals/Pain Pain Assessment Pain Assessment: 0-10 Pain Score: 10-Worst pain ever Breathing:  normal Negative Vocalization: none Facial Expression: facial grimacing Body Language: tense, distressed pacing, fidgeting Consolability: distracted or reassured by voice/touch PAINAD Score: 4 Pain Location: B hips/buttocks Pain Descriptors / Indicators: Discomfort, Grimacing Pain Intervention(s): Limited activity within patient's tolerance, Monitored during session, Repositioned (pt perseverates on getting dilaudid which RN/MD already aware of as per observation by this auther around 9am this date.)     Hand Dominance Right   Extremity/Trunk Assessment Upper Extremity Assessment Upper Extremity Assessment: Generalized weakness   Lower Extremity Assessment Lower Extremity Assessment: Generalized weakness   Cervical / Trunk Assessment Cervical / Trunk Assessment: Normal   Communication Communication Communication: No difficulties   Cognition Arousal/Alertness: Awake/alert Behavior During Therapy: WFL for tasks assessed/performed, Anxious Overall Cognitive Status: No family/caregiver present to determine baseline cognitive functioning                                 General Comments: patient difficult to follow at times regarding current situation, overall able to follow commands, somewhat anxious seemingly 2/2 pain. Peseverates on getting IV pain medication     General Comments       Exercises Other Exercises Other Exercises: OT ed with pt re: safety considerations with use of RW including sequence. In addition, ed with pt re: non-medication pain strategies including breathing, relaxation, and guided imagery.   Shoulder Instructions      Home Living Family/patient expects to be discharged to:: Private residence Living Arrangements: Spouse/significant other Available Help at Discharge: Family;Available 24 hours/day Type of Home: House Home Access: Stairs to enter [employer] of Steps: 3 Entrance Stairs-Rails: Right;Left Home Layout: One level          Biochemist, clinical: Standard     Home Equipment: Conservation officer, nature (2 wheels)          Prior Functioning/Environment Prior Level of Function : Needs assist       Physical Assist : Mobility (physical);ADLs (physical) Mobility (physical): Gait   Mobility Comments: patient reports husband helps her at baseline to walk sometimes ADLs Comments: PRN basic ADL help for things like bathing, states she can perform most seated ADLs herself        OT Problem List: Decreased strength;Decreased activity tolerance      OT Treatment/Interventions: Self-care/ADL training;Therapeutic exercise;Therapeutic activities;DME and/or AE instruction    OT Goals(Current goals can be found in the care plan section) Acute Rehab OT Goals Patient Stated Goal: get IV pain meds and go home OT Goal Formulation: With patient Time For Goal Achievement: 03/16/21 Potential to Achieve Goals: Good ADL Goals Pt Will Perform Lower Body Dressing: with supervision;sit to/from stand Pt Will Transfer to Toilet: with supervision;ambulating Pt Will Perform Toileting - Clothing Manipulation and hygiene: with set-up;sit to/from stand  OT Frequency: Min 2X/week    Co-evaluation              AM-PAC OT "6 Clicks" Daily Activity     Outcome Measure Help from another person eating meals?: None Help from another person taking care of personal grooming?: None Help from another  person toileting, which includes using toliet, bedpan, or urinal?: A Little Help from another person bathing (including washing, rinsing, drying)?: A Little Help from another person to put on and taking off regular upper body clothing?: None Help from another person to put on and taking off regular lower body clothing?: A Little 6 Click Score: 21   End of Session Equipment Utilized During Treatment: Rolling walker (2 wheels) Nurse Communication: Mobility status  Activity Tolerance: Patient tolerated treatment well Patient left: in  bed;with call bell/phone within reach  OT Visit Diagnosis: Unsteadiness on feet (R26.81);Muscle weakness (generalized) (M62.81)                Time: 3007-6226 OT Time Calculation (min): 36 min Charges:  OT General Charges $OT Visit: 1 Visit OT Evaluation $OT Eval Low Complexity: 1 Low OT Treatments $Self Care/Home Management : 8-22 mins  Gerrianne Scale, MS, OTR/L ascom [phone] 03/02/21, 5:53 PM

## 2021-03-02 NOTE — Progress Notes (Signed)
Date of Admission:  02/26/2021     ID: TIYANA GALLA is a 56 y.o. female  Principal Problem:   COVID-19 virus infection Active Problems:   Hypothyroidism   Colitis   Pneumonia due to COVID-19 virus   Mastoiditis   Headache    Subjective: She is more awake today Cough better Some lower abdominal pain  Medications:   vitamin C  500 mg Oral Daily   atorvastatin  40 mg Oral Daily   Chlorhexidine Gluconate Cloth  6 each Topical Daily   enoxaparin (LOVENOX) injection  40 mg Subcutaneous Q24H   famotidine  20 mg Oral BID   folic acid  1 mg Oral Daily   gabapentin  300 mg Oral TID   levothyroxine  100 mcg Oral QAC breakfast   multivitamin with minerals  1 tablet Oral Daily   sodium bicarbonate  1,300 mg Oral QID   sodium chloride flush  3 mL Intravenous Q12H   thiamine  100 mg Oral Daily   tiZANidine  4 mg Oral QHS   vitamin B-12  1,000 mcg Oral Daily   zinc sulfate  220 mg Oral Daily    Objective: Vital signs in last 24 hours: Patient Vitals for the past 24 hrs:  BP Temp Temp src Pulse Resp SpO2  03/02/21 1705 119/85 97.8 F (36.6 C) Oral 93 18 98 %  03/02/21 1056 117/86 98.3 F (36.8 C) -- 86 18 100 %  03/02/21 0928 132/77 97.8 F (36.6 C) Oral 87 18 100 %  03/02/21 0325 129/80 98 F (36.7 C) Oral 84 16 98 %  03/01/21 2342 103/70 97.9 F (36.6 C) Oral 84 17 92 %  03/01/21 2053 (!) 89/63 -- -- 82 -- 94 %  03/01/21 2050 (!) 143/106 (!) 97.4 F (36.3 C) Oral (!) 55 18 (!) 85 %     PHYSICAL EXAM:  General: Alert, cooperative, chronically ill, plae, puffy eyelids Neck symmetrical, no adenopathy, thyroid: non tender no carotid bruit and no JVD. Back: No CVA tenderness. Lungs: b/l air entry. Heart: Regular rate and rhythm, no murmur, rub or gallop. Abdomen: Soft, non-tender,not distended. Bowel sounds normal. No masses Extremities: arthritis changes fingers with ulnar deviation Some erythema fingers Skin: bruising and thin skin extremities Lymph:  Cervical, supraclavicular normal. Neurologic: Grossly non-focal  Lab Results Recent Labs    03/01/21 1042 03/02/21 1330  WBC 4.1 6.8  HGB 8.5* 9.8*  HCT 25.2* 28.8*  NA 138 141  K 3.2* 4.0  CL 115* 116*  CO2 19* 16*  BUN 10 7  CREATININE 1.32* 1.19*   Liver Panel Recent Labs    02/28/21 0449 03/02/21 1330  PROT 5.7* 5.8*  ALBUMIN 3.0* 3.2*  AST 41 48*  ALT 11 15  ALKPHOS 70 123  BILITOT 0.7 0.9    Microbiology: 02/26/21- BC staph simulans from port and periphery ( doubt they were 2 different locations as the time is the same 4.13 on both)on admission 02/27/21 BC X 2 from periphery NG   Assessment/Plan: COVID resp illness- on remdisivr- b/l infiltrates likely due to that   H/o abd pain CT scan showed colitis , but no type 7 stool or frequent stool documented- DC zosyn   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 Pt has a port- and she had staph simulans ( not hominins as mentioned in my note yesterday) blood culture in sept 2022. This organism could be a contaminant or a true  pathogen- await repeat blood culture- meanwhile will get 2 d echo- if repeat culture is positive then she will need PORT removal- if repeat blood culture is negative then will give vanco for a total of 7 days  until 03/05/21 and repeat blood culture as oP in 3 weeks   Hypothyroidism - on synthroid  Anemia   CKD   Fibromyalgia- she was on tramadol as OP but now getting diluadid/percocet   H/o B/L TKA   Rheumatoid arthritis- not on any meds  Discussed the management with the care team

## 2021-03-02 NOTE — Progress Notes (Signed)
Discontinue the order for IV dilaudid per dr order

## 2021-03-02 NOTE — Evaluation (Signed)
Physical Therapy Evaluation Patient Details Name: Adrienne Flores MRN: [id number] DOB: July 23, 1965 Today's Date: 03/02/2021  History of Present Illness  56 y.o. female with medical history significant of chronic normocytic anemia, chronic abdominal pain, fibromyalgia, chronic depression/anxiety, gastroparesis, hypothyroidism, rheumatoid arthritis who presented to [facility] ED with complaints of abdominal pain, diarrhea, progressive headache of 2 days duration.  Associated with nonproductive cough, dyspnea with exertion.  COVID-19 screening test positive on 02/26/2021. Work-up revealed, imaging significant for right ear otitis media and possible mastoiditis.  CTA chest negative for PE, consistent with possible COVID-pneumonia/bacterial pneumonia.  CT abdomen with diffuse colitis.  COVID-19 screening test positive on 02/26/2021.  She received IV antiviral, remdesivir, she is currently on Zosyn for intra-abdominal infection.   Clinical Impression  Patient received in bed, room dark. She is reporting severe B hip pain. Rates it 10/10. Patient is mod independent with bed mobility, transfers with supervision and ambulated 10 feet with RW and min guard. Patient limited by pain. Requesting dilaudid. States that this was the only thing that helped her pain. Aside from reported pain, it appears as though patient is close to baseline with mobility as she requires assist from her husband at home. She will continue to benefit from skilled PT while here to improve mobility and strength.           Recommendations for follow up therapy are one component of a multi-disciplinary discharge planning process, led by the attending physician.  Recommendations may be updated based on patient status, additional functional criteria and insurance authorization.  Follow Up Recommendations Home health PT    Assistance Recommended at Discharge Frequent or constant Supervision/Assistance  Patient can return home with the following  A  little help with walking and/or transfers;A little help with bathing/dressing/bathroom;Assistance with cooking/housework;Direct supervision/assist for medications management;Help with stairs or ramp for entrance;Assist for transportation    Equipment Recommendations None recommended by PT  Recommendations for Other Services       Functional Status Assessment Patient has had a recent decline in their functional status and demonstrates the ability to make significant improvements in function in a reasonable and predictable amount of time.     Precautions / Restrictions Precautions Precaution Comments: mod fall Restrictions Weight Bearing Restrictions: No      Mobility  Bed Mobility Overal bed mobility: Modified Independent             General bed mobility comments: performed bed mobility with mod independence    Transfers Overall transfer level: Needs assistance Equipment used: Rolling walker (2 wheels) Transfers: Sit to/from Stand Sit to Stand: Supervision                Ambulation/Gait Ambulation/Gait assistance: Supervision Gait Distance (Feet): 10 Feet Assistive device: Rolling walker (2 wheels) Gait Pattern/deviations: Step-through pattern, Decreased step length - right, Decreased step length - left, Decreased stride length Gait velocity: decr     General Gait Details: patient slow with mobility, labored gait, pain limited  Stairs            Wheelchair Mobility    Modified Rankin (Stroke Patients Only)       Balance Overall balance assessment: Modified Independent                                           Pertinent Vitals/Pain Pain Assessment Pain Assessment: 0-10 Pain Score: 10-Worst pain ever Breathing:  normal Negative Vocalization: none Facial Expression: facial grimacing Body Language: tense, distressed pacing, fidgeting Consolability: distracted or reassured by voice/touch PAINAD Score: 4 Pain Location: B  hips Pain Descriptors / Indicators: Discomfort, Aching, Sore, Grimacing, Guarding Pain Intervention(s): Monitored during session, Repositioned, Patient requesting pain meds-RN notified    Home Living Family/patient expects to be discharged to:: Private residence Living Arrangements: Spouse/significant other Available Help at Discharge: Family;Available 24 hours/day Type of Home: House Home Access: Stairs to enter Entrance Stairs-Rails: Psychiatric nurse of Steps: 3   Home Layout: One level Home Equipment: Conservation officer, nature (2 wheels)      Prior Function Prior Level of Function : Needs assist       Physical Assist : Mobility (physical);ADLs (physical) Mobility (physical): Gait   Mobility Comments: patient reports husband helps her at baseline ADLs Comments: husband helps as needed     Hand Dominance   Dominant Hand: Right    Extremity/Trunk Assessment   Upper Extremity Assessment Upper Extremity Assessment: Generalized weakness    Lower Extremity Assessment Lower Extremity Assessment: Generalized weakness    Cervical / Trunk Assessment Cervical / Trunk Assessment: Normal  Communication   Communication: No difficulties  Cognition Arousal/Alertness: Awake/alert Behavior During Therapy: WFL for tasks assessed/performed Overall Cognitive Status: No family/caregiver present to determine baseline cognitive functioning                                 General Comments: patient difficult to follow at times regarding current situation        General Comments      Exercises     Assessment/Plan    PT Assessment Patient needs continued PT services  PT Problem List Decreased strength;Decreased mobility;Decreased activity tolerance;Pain       PT Treatment Interventions Therapeutic activities;Gait training;Therapeutic exercise;Stair training;Functional mobility training;Patient/family education    PT Goals (Current goals can be found in  the Care Plan section)  Acute Rehab PT Goals Patient Stated Goal: to reduce pain in hips PT Goal Formulation: With patient Time For Goal Achievement: 03/16/21 Potential to Achieve Goals: Good    Frequency Min 2X/week     Co-evaluation               AM-PAC PT "6 Clicks" Mobility  Outcome Measure Help needed turning from your back to your side while in a flat bed without using bedrails?: A Little Help needed moving from lying on your back to sitting on the side of a flat bed without using bedrails?: A Little Help needed moving to and from a bed to a chair (including a wheelchair)?: A Little Help needed standing up from a chair using your arms (e.g., wheelchair or bedside chair)?: A Little Help needed to walk in hospital room?: A Little Help needed climbing 3-5 steps with a railing? : A Lot 6 Click Score: 17    End of Session   Activity Tolerance: Patient limited by pain Patient left: in bed;with call bell/phone within reach;with bed alarm set Nurse Communication: Mobility status;Patient requests pain meds PT Visit Diagnosis: Muscle weakness (generalized) (M62.81);Difficulty in walking, not elsewhere classified (R26.2);Other abnormalities of gait and mobility (R26.89);Pain Pain - Right/Left:  (bilateral) Pain - part of body: Hip    Time: 1526-1540 PT Time Calculation (min) (ACUTE ONLY): 14 min   Charges:   PT Evaluation $PT Eval Moderate Complexity: 1 Mod          Jnae Thomaston, PT, GCS  03/02/21,3:59 PM

## 2021-03-02 NOTE — Progress Notes (Addendum)
Received a call from bedside RN regarding patient's hands being erythematous.  Went to bedside and evaluated the patient.  Patient reports her hands are always red.  She denies pain.  No tenderness with moderate palpation.  No evidence of cellulitis.  Patient is in no distress.  Charge/Care time: Less than 25 minutes.

## 2021-03-02 NOTE — Progress Notes (Addendum)
Shift summary:  Patient has intermittently been agitated throughout the night. She's either completely zonked out, snoring, sleeping through vitals signs, etc or she is agitated/crying/screaming about her pain.   On day shift per report/documentation, patient was noted to be extremely lethargic, hard to arouse and wasn't awake/alert enough for PO medications. Her blood pressures were also super low and blood sugar was low due to not being alert/awake enough to eat.  Because of these things, as well still being intermittently lethargic and blood pressures on the softer side I have been reluctant to given any Dilaudid. When the patient wakes up all she wants is dilaudid, and has refused to take any of her scheduled medications as well as her other PRNs avalible except Dilaudid.  She also has unhooked her IV tubing from her port which has multiple things running through it. Patient has been educated about the importance of receiving these medications and also risk of infection due to unhooking her lines and leaving them on the floor. No evidence of any understanding or concern, other than she wants dilaudid.  Patient finally at 0415 agreed to try oxycodone for her pain. Also refused synthroid and lab draws.

## 2021-03-03 ENCOUNTER — Inpatient Hospital Stay
Admit: 2021-03-03 | Discharge: 2021-03-03 | Disposition: A | Discharge status: Home / Self Care | Payer: Medicare HMO | Attending: Infectious Diseases | Admitting: Infectious Diseases

## 2021-03-03 DIAGNOSIS — M797 Fibromyalgia: Secondary | ICD-10-CM | POA: Diagnosis present

## 2021-03-03 DIAGNOSIS — N1831 Chronic kidney disease, stage 3a: Secondary | ICD-10-CM | POA: Diagnosis present

## 2021-03-03 DIAGNOSIS — B957 Other staphylococcus as the cause of diseases classified elsewhere: Secondary | ICD-10-CM | POA: Diagnosis present

## 2021-03-03 DIAGNOSIS — R7881 Bacteremia: Secondary | ICD-10-CM | POA: Diagnosis present

## 2021-03-03 LAB — CBC WITH DIFFERENTIAL/PLATELET
Abs Immature Granulocytes: 0.24 10*3/uL — ABNORMAL HIGH (ref 0.00–0.07)
Basophils Absolute: 0 10*3/uL (ref 0.0–0.1)
Basophils Relative: 1 %
Eosinophils Absolute: 0.1 10*3/uL (ref 0.0–0.5)
Eosinophils Relative: 2 %
HCT: 23.4 % — ABNORMAL LOW (ref 36.0–46.0)
Hemoglobin: 8.1 g/dL — ABNORMAL LOW (ref 12.0–15.0)
Immature Granulocytes: 6 %
Lymphocytes Relative: 27 %
Lymphs Abs: 1 10*3/uL (ref 0.7–4.0)
MCH: 30.3 pg (ref 26.0–34.0)
MCHC: 34.6 g/dL (ref 30.0–36.0)
MCV: 87.6 fL (ref 80.0–100.0)
Monocytes Absolute: 0.2 10*3/uL (ref 0.1–1.0)
Monocytes Relative: 6 %
Neutro Abs: 2.2 10*3/uL (ref 1.7–7.7)
Neutrophils Relative %: 58 %
Platelets: 151 10*3/uL (ref 150–400)
RBC: 2.67 MIL/uL — ABNORMAL LOW (ref 3.87–5.11)
RDW: 17.9 % — ABNORMAL HIGH (ref 11.5–15.5)
Smear Review: NORMAL
WBC: 3.8 10*3/uL — ABNORMAL LOW (ref 4.0–10.5)
nRBC: 0.8 % — ABNORMAL HIGH (ref 0.0–0.2)

## 2021-03-03 LAB — COMPREHENSIVE METABOLIC PANEL
ALT: 12 U/L (ref 0–44)
AST: 33 U/L (ref 15–41)
Albumin: 2.6 g/dL — ABNORMAL LOW (ref 3.5–5.0)
Alkaline Phosphatase: 100 U/L (ref 38–126)
Anion gap: 4 — ABNORMAL LOW (ref 5–15)
BUN: 6 mg/dL (ref 6–20)
CO2: 22 mmol/L (ref 22–32)
Calcium: 7 mg/dL — ABNORMAL LOW (ref 8.9–10.3)
Chloride: 115 mmol/L — ABNORMAL HIGH (ref 98–111)
Creatinine, Ser: 1.27 mg/dL — ABNORMAL HIGH (ref 0.44–1.00)
GFR, Estimated: 49 mL/min — ABNORMAL LOW (ref >60)
Glucose, Bld: 93 mg/dL (ref 70–99)
Potassium: 4 mmol/L (ref 3.5–5.1)
Sodium: 141 mmol/L (ref 135–145)
Total Bilirubin: 0.6 mg/dL (ref 0.3–1.2)
Total Protein: 4.9 g/dL — ABNORMAL LOW (ref 6.5–8.1)

## 2021-03-03 LAB — ECHOCARDIOGRAM COMPLETE
AR max vel: 2.17 cm2
AV Area VTI: 1.96 cm2
AV Area mean vel: 2.18 cm2
AV Mean grad: 4 mmHg
AV Peak grad: 7.3 mmHg
Ao pk vel: 1.35 m/s
Area-P 1/2: 3.58 cm2
Height: 61 in
MV VTI: 2.32 cm2
P 1/2 time: 1128 msec
S' Lateral: 2.78 cm
Weight: 2560 oz

## 2021-03-03 LAB — BASIC METABOLIC PANEL
Anion gap: 6 (ref 5–15)
BUN: 6 mg/dL (ref 6–20)
CO2: 21 mmol/L — ABNORMAL LOW (ref 22–32)
Calcium: 7.2 mg/dL — ABNORMAL LOW (ref 8.9–10.3)
Chloride: 115 mmol/L — ABNORMAL HIGH (ref 98–111)
Creatinine, Ser: 1.3 mg/dL — ABNORMAL HIGH (ref 0.44–1.00)
GFR, Estimated: 48 mL/min — ABNORMAL LOW (ref >60)
Glucose, Bld: 77 mg/dL (ref 70–99)
Potassium: 4 mmol/L (ref 3.5–5.1)
Sodium: 142 mmol/L (ref 135–145)

## 2021-03-03 LAB — D-DIMER, QUANTITATIVE: D-Dimer, Quant: 0.46 ug/mL-FEU (ref 0.00–0.50)

## 2021-03-03 LAB — C-REACTIVE PROTEIN: CRP: 2 mg/dL — ABNORMAL HIGH (ref <1.0)

## 2021-03-03 MED ORDER — LEVOTHYROXINE SODIUM 25 MCG PO TABS
125.0000 ug | ORAL_TABLET | Freq: Every day | ORAL | Status: DC
  Administered 2021-03-03 – 2021-03-05 (×3): 125 ug via ORAL
  Filled 2021-03-03 (×3): qty 1

## 2021-03-03 MED ORDER — SODIUM BICARBONATE 8.4 % IV SOLN
100.0000 meq | Freq: Once | INTRAVENOUS | Status: AC
  Administered 2021-03-03: 05:00:00 100 meq via INTRAVENOUS
  Filled 2021-03-03: qty 50

## 2021-03-03 MED ORDER — MIDODRINE HCL 5 MG PO TABS
5.0000 mg | ORAL_TABLET | Freq: Three times a day (TID) | ORAL | Status: DC
  Administered 2021-03-03 – 2021-03-05 (×6): 5 mg via ORAL
  Filled 2021-03-03 (×6): qty 1

## 2021-03-03 MED ORDER — SODIUM CHLORIDE 0.9 % IV BOLUS
1000.0000 mL | Freq: Once | INTRAVENOUS | Status: AC
  Administered 2021-03-03: 16:00:00 1000 mL via INTRAVENOUS

## 2021-03-03 MED ORDER — OXYCODONE HCL 5 MG PO TABS
5.0000 mg | ORAL_TABLET | ORAL | Status: DC | PRN
  Administered 2021-03-03 – 2021-03-04 (×3): 5 mg via ORAL
  Filled 2021-03-03 (×3): qty 1

## 2021-03-03 MED ORDER — HYDROCORTISONE SOD SUC (PF) 100 MG IJ SOLR
100.0000 mg | Freq: Once | INTRAMUSCULAR | Status: DC
  Filled 2021-03-03: qty 2

## 2021-03-03 NOTE — Progress Notes (Signed)
°   03/03/21 0015  Assess: MEWS Score  Temp 97.6 F (36.4 C)  BP (!) 72/49  Pulse Rate 72  Resp 20  Level of Consciousness Alert  SpO2 100 %  O2 Device Room Air  Assess: MEWS Score  MEWS Temp 0  MEWS Systolic 2  MEWS Pulse 0  MEWS RR 0  MEWS LOC 0  MEWS Score 2  MEWS Score Color Yellow  Assess: if the MEWS score is Yellow or Red  Were vital signs taken at a resting state? Yes  Focused Assessment No change from prior assessment  Does the patient meet 2 or more of the SIRS criteria? No  MEWS guidelines implemented *See Row Information* Yes  Treat  MEWS Interventions Administered prn meds/treatments;Escalated (See documentation below)  Pain Scale 0-10  Pain Score Asleep  Take Vital Signs  Increase Vital Sign Frequency  Yellow: Q 2hr X 2 then Q 4hr X 2, if remains yellow, continue Q 4hrs  Escalate  MEWS: Escalate Yellow: discuss with charge nurse/RN and consider discussing with provider and RRT  Notify: Charge Nurse/RN  Name of Charge Nurse/RN Notified Earlie Server RN  Date Charge Nurse/RN Notified 03/03/21  Time Charge Nurse/RN Notified 0030  Notify: Provider  Provider Name/Title Sharion Settler NP  Date Provider Notified 03/03/21  Time Provider Notified 0032  Notification Type Page  Notification Reason Change in status  Provider response See new orders  Date of Provider Response 03/03/21  Time of Provider Response 0033  Document  Progress note created (see row info) Yes  Assess: SIRS CRITERIA  SIRS Temperature  0  SIRS Pulse 0  SIRS Respirations  0  SIRS WBC 0  SIRS Score Sum  0

## 2021-03-03 NOTE — Assessment & Plan Note (Signed)
Clinically this has resolved.

## 2021-03-03 NOTE — Progress Notes (Signed)
[employer] with persistent asymptomatic hypotension despite midodrine.  Labs show severe primary hypothyroidism.  Random cortisol level ok.   Synthroid dose increase to 125 mcg. Need follow up labs 6 weeks 2 amps sodium bicarb for severe metabolic acidosis - likely also  to improve blood pressure

## 2021-03-03 NOTE — Care Management Important Message (Signed)
Important Message  Patient Details  Name: Adrienne Flores MRN: [id number] Date of Birth: September 14, 1965   Medicare Important Message Given:  Yes  Called into room due to airborne isolation. IM given to spouse via phone.  Tarique Loveall E Eduarda Scrivens, LCSW 03/03/2021, 11:07 AM

## 2021-03-03 NOTE — Assessment & Plan Note (Signed)
Completed remdesivir and steroids.  Weaned off oxygen.

## 2021-03-03 NOTE — Progress Notes (Signed)
Date of Admission:  02/26/2021     ID: Adrienne Flores is a 56 y.o. female  Principal Problem:   Pneumonia due to COVID-19 virus Active Problems:   Gastroparesis   Hypothyroidism   Rheumatoid arthritis ([place])   Iron deficiency anemia   Colitis   Fibromyalgia   Stage 3a chronic kidney disease (CKD) ([place])   Bacteremia due to coagulase-negative Staphylococcus    Subjective: She is awake and more alert Always c/o pain abdomen and back and wants medications No fever   Medications:   vitamin C  500 mg Oral Daily   atorvastatin  40 mg Oral Daily   Chlorhexidine Gluconate Cloth  6 each Topical Daily   enoxaparin (LOVENOX) injection  40 mg Subcutaneous Q24H   famotidine  20 mg Oral BID   folic acid  1 mg Oral Daily   gabapentin  300 mg Oral TID   levothyroxine  125 mcg Oral QAC breakfast   midodrine  5 mg Oral TID WC   multivitamin with minerals  1 tablet Oral Daily   sodium bicarbonate  1,300 mg Oral QID   sodium chloride flush  3 mL Intravenous Q12H   thiamine  100 mg Oral Daily   tiZANidine  4 mg Oral QHS   vitamin B-12  1,000 mcg Oral Daily   zinc sulfate  220 mg Oral Daily    Objective: Vital signs in last 24 hours: Patient Vitals for the past 24 hrs:  BP Temp Temp src Pulse Resp SpO2  03/03/21 1715 (!) 90/58 (!) 97.4 F (36.3 C) Oral 62 16 100 %  03/03/21 1428 (!) 76/53 -- -- 62 18 97 %  03/03/21 1200 (!) 73/44 98 F (36.7 C) -- (!) 52 18 100 %  03/03/21 0900 (!) 70/40 (!) 97.4 F (36.3 C) Axillary (!) 58 18 98 %  03/03/21 0619 (!) 90/48 97.6 F (36.4 C) Oral (!) 59 18 96 %  03/03/21 0251 (!) 82/52 97.8 F (36.6 C) Oral -- 17 96 %  03/03/21 0015 (!) 72/49 97.6 F (36.4 C) Oral 72 20 100 %  03/02/21 2112 106/64 98.7 F (37.1 C) Oral 88 18 100 %  03/02/21 2111 114/70 98.7 F (37.1 C) -- 89 18 99 %     PHYSICAL EXAM:  General: Alert, cooperative, chronically ill, pale, puffy eyelids and face Lungs: b/l air entry. Few basal crepts Heart: Regular rate  and rhythm, no murmur, rub or gallop. Abdomen: Soft, non-tender,not distended. Bowel sounds normal. No masses Extremities: arthritis changes fingers with ulnar deviation Eryhtema of hands- likely due to thin skin Skin: bruising and thin skin extremities Lymph: Cervical, supraclavicular normal. Neurologic: Grossly non-focal Port chest wall rt- clean no tenderness or eryhtema or discharge  Lab Results Recent Labs    03/02/21 1330 03/03/21 0525 03/03/21 0925  WBC 6.8 3.8*  --   HGB 9.8* 8.1*  --   HCT 28.8* 23.4*  --   NA 141 141 142  K 4.0 4.0 4.0  CL 116* 115* 115*  CO2 16* 22 21*  BUN 7 6 6   CREATININE 1.19* 1.27* 1.30*   Liver Panel Recent Labs    03/02/21 1330 03/03/21 0525  PROT 5.8* 4.9*  ALBUMIN 3.2* 2.6*  AST 48* 33  ALT 15 12  ALKPHOS 123 100  BILITOT 0.9 0.6    Microbiology: 02/26/21- BC staph simulans from port and periphery ( doubt they were 2 different locations as the time is the same 4.13 on both)on  admission 02/27/21 BC X 2 from periphery NG   Assessment/Plan: COVID resp illness- completed  remdisivr-    abd pain CT scan showed colitis , but no type 7 stool or frequent stool documented- DC zosyn Cortisol level chcked and normal    Staph simulans bacteremia on admission- repeat culture started on vanco after that.No growth -  Pt has a port- and she had staph simulans  culture in sept 2022. This organism could be a contaminant or a true pathogen- -patient does not want PORT removal and would like to keep it if possible  as repeat blood culture is negative , and 2 d echo no vegetations  will give vanco for a total of 7 days  until 03/05/21 and repeat blood culture as oP in 3 weeks   Hypothyroidism - on synthroid- was increased because of very high TSH Persistent hypotension but asymptomatic, on midrodrine Cortisol being repeated  Anemia   CKD   Fibromyalgia- she was on tramadol as OP but now getting percocet- chronic pain   H/o B/L TKA    Rheumatoid arthritis- not on any meds  Discussed the management with the hospitalist ID will sign off- call if needed

## 2021-03-03 NOTE — Assessment & Plan Note (Addendum)
TSH markedly elevated - Continue new dose levothyroxine

## 2021-03-03 NOTE — Assessment & Plan Note (Signed)
Hemoglobin trending down, no clinical bleeding

## 2021-03-03 NOTE — Hospital Course (Addendum)
Adrienne Flores is a 56 y.o. 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     COVID+ with mild hypoxia.  Pancolitis on CT scan.    She received IV antiviral, remdesivir, and IV antibiotics Zosyn for possible intra-abdominal infection.   Blood cultures drawn on 02/26/2021 positive for staph simulans methicillin-resistant 4 out of 4 bottles.  ID consulted, echocardiogram without vegetations.  BP soft, but improved with standing, asymptomatic.

## 2021-03-03 NOTE — TOC Initial Note (Addendum)
Transition of Care [facility]) - Initial/Assessment Note    Patient Details  Name: Adrienne Flores MRN: [id number] Date of Birth: [birth year]  Transition of Care [facility]) CM/SW Contact:    Magnus Ivan, LCSW Phone Number: 03/03/2021, 11:08 AM  Clinical Narrative:                Called into room to deliver IM and discuss PT/OT recs. Spouse answered phone, stated patient is sleeping. Spouse is on DPR to discuss info with. Patient lives with spouse. PCP is [facility] in [place]. Pharmacy is [facility] in [place]. Patient has access to DME including a shower chair, raised toilet seat, and RW. CSW explained PT/OT recs for Home Health and 3 in 1. Patient's spouse declined. Stated he is with patient 24/7 and feels he can do exercises with her at home. He also stated they have DME so don't want the 3 in 1.   Expected Discharge Plan: Home/Self Care Barriers to Discharge: Continued Medical Work up   Patient Goals and CMS Choice Patient states their goals for this hospitalization and ongoing recovery are:: home with spouse CMS Medicare.gov Compare Post Acute Care list provided to:: Patient Represenative (must comment) Choice offered to / list presented to : Spouse  Expected Discharge Plan and Services Expected Discharge Plan: Home/Self Care       Living arrangements for the past 2 months: Single Family Home                                      Prior Living Arrangements/Services Living arrangements for the past 2 months: Single Family Home Lives with:: Spouse Patient language and need for interpreter reviewed:: Yes        Need for Family Participation in Patient Care: Yes (Comment) Care giver support system in place?: Yes (comment) Current home services: DME Criminal Activity/Legal Involvement Pertinent to Current Situation/Hospitalization: No - Comment as needed  Activities of Daily Living Home Assistive Devices/Equipment: Other (Comment) (pt states holding bars along  walls) ADL Screening (condition at time of admission) Patient's cognitive ability adequate to safely complete daily activities?: Yes Is the patient deaf or have difficulty hearing?: Yes Does the patient have difficulty seeing, even when wearing glasses/contacts?: No Does the patient have difficulty concentrating, remembering, or making decisions?: No Patient able to express need for assistance with ADLs?: Yes Does the patient have difficulty dressing or bathing?: No Independently performs ADLs?: Yes (appropriate for developmental age) Does the patient have difficulty walking or climbing stairs?: Yes Weakness of Legs: Both Weakness of Arms/Hands: None  Permission Sought/Granted                  Emotional Assessment         Alcohol / Substance Use: Not Applicable Psych Involvement: No (comment)  Admission diagnosis:  Colitis [K52.9] Pulmonary infiltrate [R91.8] Generalized abdominal pain [R10.84] Mastoiditis of right side [H70.91] COVID-19 virus infection [U07.1] Patient Active Problem List   Diagnosis Date Noted   Fibromyalgia 03/03/2021   Stage 3a chronic kidney disease (CKD) ([place]) 03/03/2021   Bacteremia due to coagulase-negative Staphylococcus 03/03/2021   Pneumonia due to COVID-19 virus 02/26/2021   Respiratory alkalosis 11/05/2017   Hypotension 06/08/2017   Hypokalemia 06/08/2017   Mechanical complication of vascular device 05/16/2017   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   Loss of weight    Abdominal pain, generalized    Gastritis without bleeding    Iron  deficiency anemia 07/02/2016   Disoriented to time 01/24/2016   Protein-calorie malnutrition, severe 12/12/2015   Symptomatic anemia 12/09/2015   Acute hyponatremia 12/09/2015   Hematemesis 12/09/2015   Chronic hoarseness 12/09/2015   Unintended weight loss 12/09/2015   H/O medication noncompliance 12/07/2015   Chronic lower back pain 08/30/2014   Rheu arthrit of right ank/ft w involv of organs and systems ([place])  06/16/2014   DDD (degenerative disc disease), cervical 06/16/2014   DDD (degenerative disc disease), thoracic 06/16/2014   DDD (degenerative disc disease), lumbosacral 06/16/2014   Diarrhea 08/22/2013   Hypokalemia due to inadequate potassium intake 08/22/2013   Chronic abdominal and back  pain 07/26/2013   Nausea and vomiting 07/26/2013   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   Rheumatoid arthritis ([place]) 07/26/2013   Acute on chronic renal failure ([place]) 06/27/2013   AKI (acute kidney injury) ([place]) 06/25/2013   Transaminitis 06/25/2013   Gastroparesis 11/26/2012   Hypothyroidism 11/26/2012   Hyperlipidemia 11/03/2012   Lightheadedness 10/26/2012   Other specified abnormal immunological findings in serum 10/01/2012   Colitis 01/19/2012   Primary localized osteoarthrosis of hand 74/01/8785   Cyclic vomiting syndrome 09/14/2010   Mixed urge and stress incontinence 08/01/2010   Depressive disorder 12/20/2009   PCP:  [name], MD Pharmacy:   [facility] [place], [place] - [facility] [address] AT [place] [address] Phone: [phone] Fax: [phone]     Social Determinants of Health ([place]) Interventions    Readmission Risk Interventions Readmission Risk Prevention Plan 03/03/2021  Transportation Screening Complete  PCP or Specialist Appt within 5-7 Days Complete  Home Care Screening Complete  Medication Review (RN CM) Complete  Some recent data might be hidden

## 2021-03-03 NOTE — Assessment & Plan Note (Signed)
Discussed with infectious disease, patient refusing removal of port. - Continue vancomycin

## 2021-03-03 NOTE — Assessment & Plan Note (Signed)
Not on medication 

## 2021-03-03 NOTE — Progress Notes (Signed)
°   03/03/21 1200  Assess: MEWS Score  Temp 98 F (36.7 C)  BP (!) 73/44  Pulse Rate (!) 52  Resp 18  SpO2 100 %  O2 Device Room Air  Assess: MEWS Score  MEWS Temp 0  MEWS Systolic 2  MEWS Pulse 0  MEWS RR 0  MEWS LOC 0  MEWS Score 2  MEWS Score Color Yellow  Assess: if the MEWS score is Yellow or Red  Were vital signs taken at a resting state? Yes  Focused Assessment Change from prior assessment (see assessment flowsheet)  Does the patient meet 2 or more of the SIRS criteria? No  MEWS guidelines implemented *See Row Information* Yes  Treat  MEWS Interventions Escalated (See documentation below)  Pain Scale 0-10  Pain Score Asleep  Take Vital Signs  Increase Vital Sign Frequency  Yellow: Q 2hr X 2 then Q 4hr X 2, if remains yellow, continue Q 4hrs  Escalate  MEWS: Escalate Yellow: discuss with charge nurse/RN and consider discussing with provider and RRT  Notify: Charge Nurse/RN  Name of Charge Nurse/RN Notified Estill Bamberg RN  Date Charge Nurse/RN Notified 03/03/21  Time Charge Nurse/RN Notified 0900  Notify: Provider  Provider Name/Title Dr Harrell Gave  Date Provider Notified 03/03/21  Time Provider Notified [phone]  Notification Type  (secure chat)  Notification Reason Change in status  Provider response Other (Comment) (PRN meds)  Date of Provider Response 03/03/21  Assess: SIRS CRITERIA  SIRS Temperature  0  SIRS Pulse 0  SIRS Respirations  0  SIRS WBC 0  SIRS Score Sum  0

## 2021-03-03 NOTE — Assessment & Plan Note (Signed)
-   Continue tizanidine, oxycodone, gabapentin

## 2021-03-03 NOTE — Progress Notes (Signed)
PT Cancellation Note  Patient Details Name: Adrienne Flores MRN: [id number] DOB: 09/04/1965   Cancelled Treatment:    Reason Eval/Treat Not Completed: Medical issues which prohibited therapy (Per chart, most recent vitals shoe BP: 73/16mmHg, HR in 50s. Will defer PT treatment to later date/time.)  3:24 PM, 03/03/21 Etta Grandchild, PT, DPT Physical Therapist - [facility]  [phone] ([place])    [place] 03/03/2021, 3:24 PM

## 2021-03-03 NOTE — Progress Notes (Signed)
*  PRELIMINARY RESULTS* Echocardiogram 2D Echocardiogram has been performed.  Adrienne Flores 03/03/2021, 3:06 PM

## 2021-03-03 NOTE — Progress Notes (Signed)
°   03/03/21 0251  Assess: MEWS Score  Temp 97.8 F (36.6 C)  BP (!) 82/52 (manual recheck)  Resp 17  Level of Consciousness Alert  SpO2 96 %  O2 Device Room Air  Assess: MEWS Score  MEWS Temp 0  MEWS Systolic 1  MEWS Pulse 0  MEWS RR 0  MEWS LOC 0  MEWS Score 1  MEWS Score Color Green  Treat  MEWS Interventions Escalated (See documentation below)  Pain Scale 0-10  Pain Score Asleep  Notify: Provider  Provider Name/Title Sharion Settler NP  Date Provider Notified 03/03/21  Time Provider Notified [phone]  Notification Type Page  Notification Reason Change in status  Provider response See new orders  Date of Provider Response 03/03/21  Time of Provider Response 0255  Document  Patient Outcome Not stable and remains on department  Assess: SIRS CRITERIA  SIRS Temperature  0  SIRS Pulse 0  SIRS Respirations  0  SIRS WBC 0  SIRS Score Sum  0

## 2021-03-03 NOTE — Progress Notes (Signed)
°  Progress Note   Patient: Adrienne Flores [id number] DOB: 1965/03/26 DOA: 02/26/2021     5 DOS: the patient was seen and examined on 03/03/2021   Brief hospital course: 56 y.o. female with medical history significant of chronic normocytic anemia, chronic abdominal pain, fibromyalgia, chronic depression/anxiety, gastroparesis, hypothyroidism, rheumatoid arthritis who presented to [facility] ED with complaints of abdominal pain, diarrhea, progressive headache of 2 days duration.  Associated with nonproductive cough, dyspnea with exertion.  COVID-19 screening test positive on 02/26/2021.   Work-up revealed, right ear otitis media and possible mastoiditis.  Possible COVID-19 viral pneumonia with coinfection bacterial pneumonia.  Pancolitis on CT scan.    She received IV antiviral, remdesivir, and IV antibiotics Zosyn for possible intra-abdominal infection.   Blood cultures drawn on 02/26/2021 positive for staph simulans methicillin-resistant 4 out of 4 bottles.    Assessment and Plan * Pneumonia due to COVID-19 virus- (present on admission) Completed remdesivir and steroids.  Weaned off oxygen.  Bacteremia due to coagulase-negative Staphylococcus- (present on admission) Discussed with infectious disease, patient refusing removal of port. - Continue vancomycin  Colitis- (present on admission) Clinically this has resolved.  Stage 3a chronic kidney disease (CKD) ([place])- (present on admission) Creatinine stable relative to baseline  Fibromyalgia- (present on admission) - Continue tizanidine, oxycodone, gabapentin  Hypotension- (present on admission) Appears hypovolemic - IV fluids - Continue  Iron deficiency anemia- (present on admission) Hemoglobin trending down, no clinical bleeding  Rheumatoid arthritis ([place])- (present on admission) Not on medication  Hypothyroidism- (present on admission) TSH markedly elevated - Continue new dose levothyroxine  Gastroparesis- (present on  admission)       Subjective: Patient complains of abdominal pain, generalized malaise and weakness.  Objective Is reviewed and markable for soft blood pressure, heart rate normalized, afebrile. Chronically ill-appearing adult female, lying in bed, obese.  Appears tired.  Heart rate regular.  No murmurs, mild nonpitting edema diffusely.  Lung sounds normal.  Abdominal tenderness diffusely  Data Reviewed:  My review of labs and imaging is notable for CMP with creatinine 1.2, no change, elevated TSH, calcium corrects to normal with low albumin.  Complete blood count notable for white blood cell count slightly down, hemoglobin trending slightly down. discussed with infectious disease      Family Communication: Called to husband, no answer  Disposition: Status is: Inpatient  Remains inpatient appropriate because: She requires ongoing IV antibiotics.  If her blood pressure improved with fluids tomorrow, and her antibiotics are completed, likely home tomorrow or the next day           Author: Edwin Dada, MD 03/03/2021 7:25 PM  For on call review [email or web address].

## 2021-03-03 NOTE — Assessment & Plan Note (Signed)
Appears hypovolemic - IV fluids - Continue

## 2021-03-03 NOTE — Progress Notes (Signed)
Pharmacy Antibiotic Note  Adrienne Flores is a 56 y.o. female admitted on 02/26/2021 with bacteremia.  Pharmacy has been consulted for vancomycin dosing. Patient presented with abdominal pain, headache and diarrhea.  She has a port due to being hard stick, appears port place in Nov 2017. She has rheumatoid arthritis but has not seen specialist in years (do not see that she is on immunomodulatory therapy).  She has PMH of chronic renal failure as well  COVID - Positive 1/15 Bcx: 4/4 bottles S. Simulans (methicillin resistant) 1/16 Bcx - NG x4  days   Blood cultures from [facility]  10/16 S. Simulans (methicillin resistant)  CT abdomen: mild to mod colitis of ascending, transverse and descending colon CT head: R middle ear fluid, Large R mastoid effusion  Plan: Continue vancomycin 1000 mg IV q24h - Dose based on patient receiving 1250mg  IV q24h at [facility] in Oct with at VT = 18 w/ SCr 1.23 Estimated AUC 573, est Cmin 12.1 using SCr 16.1 Goal AUC 400-600 Plan for 7 days per ID if cultures remain negative. Stop date placed  Monitor renal function closely.  Will check levels if repeat culture not negative and prolonged duration planned since recent dose adjustment will not be at steady state Follow-up identification of 4/50 blood culture   Height: 5\' 1"  (154.9 cm) Weight: 72.6 kg (160 lb) IBW/kg (Calculated) : 47.8  Temp (24hrs), Avg:97.9 F (36.6 C), Min:97.4 F (36.3 C), Max:98.7 F (37.1 C)  Recent Labs  Lab 02/27/21 0615 02/28/21 0449 03/01/21 0913 03/01/21 1042 03/02/21 1330 03/03/21 0525 03/03/21 0925  WBC 4.5 3.9*  --  4.1 6.8 3.8*  --   CREATININE 1.34* 0.44 1.38* 1.32* 1.19* 1.27* 1.30*     Estimated Creatinine Clearance: 43.5 mL/min (A) (by C-G formula based on SCr of 1.3 mg/dL (H)).    Allergies  Allergen Reactions   Morphine Hives   Morphine And Related Hives and Itching    ONLY MORPHINE, tolerates Dilaudid, oxycodone, and Norco    Methotrexate Derivatives Other  (See Comments)    Headaches    Nabumetone Other (See Comments)    Headaches   Methotrexate Other (See Comments)    Headaches     Haloperidol Nausea Only and Other (See Comments)    Patient stated that she broke out in cold sweats, also    Antimicrobials this admission: 1/15 doxy x1 1/15 pip/tazo >>1/18 1/16 vanco >>  Dose adjustments this admission:   Microbiology results: 1/16 Bcx: 4/4 GPC, BCID staph spp  Thank you for allowing pharmacy to be a part of this patients care.  Dorothe Pea, PharmD, BCPS Clinical Pharmacist   03/03/2021 11:06 AM

## 2021-03-03 NOTE — Assessment & Plan Note (Signed)
Creatinine stable relative to baseline 

## 2021-03-03 NOTE — TOC CM/SW Note (Signed)
Patient on COVID isolation precautions. Tried calling her in the  room to discuss home health/DME recommendations but no answer. Will try again later.  Dayton Scrape, [place]

## 2021-03-04 LAB — C-REACTIVE PROTEIN: CRP: 1.4 mg/dL — ABNORMAL HIGH (ref <1.0)

## 2021-03-04 LAB — CBC WITH DIFFERENTIAL/PLATELET
Abs Immature Granulocytes: 0.27 10*3/uL — ABNORMAL HIGH (ref 0.00–0.07)
Basophils Absolute: 0 10*3/uL (ref 0.0–0.1)
Basophils Relative: 1 %
Eosinophils Absolute: 0.2 10*3/uL (ref 0.0–0.5)
Eosinophils Relative: 4 %
HCT: 28.1 % — ABNORMAL LOW (ref 36.0–46.0)
Hemoglobin: 9.4 g/dL — ABNORMAL LOW (ref 12.0–15.0)
Immature Granulocytes: 7 %
Lymphocytes Relative: 22 %
Lymphs Abs: 0.9 10*3/uL (ref 0.7–4.0)
MCH: 30.4 pg (ref 26.0–34.0)
MCHC: 33.5 g/dL (ref 30.0–36.0)
MCV: 90.9 fL (ref 80.0–100.0)
Monocytes Absolute: 0.4 10*3/uL (ref 0.1–1.0)
Monocytes Relative: 8 %
Neutro Abs: 2.4 10*3/uL (ref 1.7–7.7)
Neutrophils Relative %: 58 %
Platelets: 157 10*3/uL (ref 150–400)
RBC: 3.09 MIL/uL — ABNORMAL LOW (ref 3.87–5.11)
RDW: 18.1 % — ABNORMAL HIGH (ref 11.5–15.5)
WBC: 4.2 10*3/uL (ref 4.0–10.5)
nRBC: 0 % (ref 0.0–0.2)

## 2021-03-04 LAB — PHOSPHORUS: Phosphorus: 2.5 mg/dL (ref 2.5–4.6)

## 2021-03-04 LAB — COMPREHENSIVE METABOLIC PANEL
ALT: 15 U/L (ref 0–44)
AST: 37 U/L (ref 15–41)
Albumin: 2.9 g/dL — ABNORMAL LOW (ref 3.5–5.0)
Alkaline Phosphatase: 117 U/L (ref 38–126)
Anion gap: 8 (ref 5–15)
BUN: 7 mg/dL (ref 6–20)
CO2: 22 mmol/L (ref 22–32)
Calcium: 7.2 mg/dL — ABNORMAL LOW (ref 8.9–10.3)
Chloride: 112 mmol/L — ABNORMAL HIGH (ref 98–111)
Creatinine, Ser: 1.38 mg/dL — ABNORMAL HIGH (ref 0.44–1.00)
GFR, Estimated: 45 mL/min — ABNORMAL LOW (ref >60)
Glucose, Bld: 85 mg/dL (ref 70–99)
Potassium: 3.5 mmol/L (ref 3.5–5.1)
Sodium: 142 mmol/L (ref 135–145)
Total Bilirubin: 0.7 mg/dL (ref 0.3–1.2)
Total Protein: 5.4 g/dL — ABNORMAL LOW (ref 6.5–8.1)

## 2021-03-04 LAB — CULTURE, BLOOD (ROUTINE X 2)
Culture: NO GROWTH
Culture: NO GROWTH
Special Requests: ADEQUATE
Special Requests: ADEQUATE

## 2021-03-04 LAB — D-DIMER, QUANTITATIVE: D-Dimer, Quant: 0.47 ug/mL-FEU (ref 0.00–0.50)

## 2021-03-04 LAB — CORTISOL-AM, BLOOD: Cortisol - AM: 12.8 ug/dL (ref 6.7–22.6)

## 2021-03-04 MED ORDER — ACETAMINOPHEN 500 MG PO TABS
1000.0000 mg | ORAL_TABLET | Freq: Three times a day (TID) | ORAL | Status: DC
  Administered 2021-03-04 – 2021-03-05 (×3): 1000 mg via ORAL
  Filled 2021-03-04 (×3): qty 2

## 2021-03-04 MED ORDER — HYDROMORPHONE HCL 2 MG PO TABS
2.0000 mg | ORAL_TABLET | ORAL | Status: DC | PRN
  Administered 2021-03-04 – 2021-03-05 (×5): 2 mg via ORAL
  Filled 2021-03-04 (×5): qty 1

## 2021-03-04 MED ORDER — COSYNTROPIN 0.25 MG IJ SOLR
0.2500 mg | Freq: Once | INTRAMUSCULAR | Status: DC
  Filled 2021-03-04: qty 0.25

## 2021-03-04 MED ORDER — ALTEPLASE 2 MG IJ SOLR
2.0000 mg | Freq: Once | INTRAMUSCULAR | Status: AC
  Administered 2021-03-05: 06:00:00 2 mg
  Filled 2021-03-04: qty 2

## 2021-03-04 MED ORDER — HYDROMORPHONE HCL 2 MG PO TABS
2.0000 mg | ORAL_TABLET | Freq: Four times a day (QID) | ORAL | Status: DC | PRN
  Administered 2021-03-04: 13:00:00 2 mg via ORAL
  Filled 2021-03-04: qty 1

## 2021-03-04 MED ORDER — LACTATED RINGERS IV SOLN: INTRAVENOUS | Status: DC

## 2021-03-04 NOTE — Progress Notes (Signed)
°  Progress Note   Patient: Adrienne Flores KMQ:[id number] DOB: 1965-10-10 DOA: 02/26/2021     6 DOS: the patient was seen and examined on 03/04/2021       Brief hospital course: Mrs. Adrienne Flores is a 56 y.o. F with hx chronic abdominal pain, anemia, depression/anxiety, gastroparesis, hypothyroidism, rheumatoid arthritis who presented with HA and cough for a few days.     COVID+ with mild hypoxia.  Pancolitis on CT scan.    She received IV antiviral, remdesivir, and IV antibiotics Zosyn for possible intra-abdominal infection.   Blood cultures drawn on 02/26/2021 positive for staph simulans methicillin-resistant 4 out of 4 bottles.          Assessment and Plan * Pneumonia due to COVID-19 virus- (present on admission) Completed remdesivir and steroids.  Weaned off oxygen.  No furhter treatment needed.  Bacteremia due to coagulase-negative Staphylococcus- (present on admission) Discussed with infectious disease, patient refusing removal of port. - Continue vancomycin until 1/22  Colitis- (present on admission) No furhter diarrhea  Stage 3a chronic kidney disease (CKD) ([place])- (present on admission) Creatinine stable relative to baseline  Fibromyalgia- (present on admission) And chronic abdominal pain - Continue tizanidine, oxycodone, gabapentin - GI follow up at discharge  Hypotension- (present on admission) Slightly improved with fluids but still hypotensive.  I do not suspect septick or cardiogenic shock.  - Check random cortisol today, if indeterminate, will perform cosyntropin stim tomorrow - Continue IV fluids - Continue midodrine for now  Iron deficiency anemia- (present on admission) Hgb stable, no clinical bleeding  Rheumatoid arthritis ([place])- (present on admission) Not on medication  Hypothyroidism- (present on admission) TSH markedly elevated - Continue new dose levothyroxine  Gastroparesis- (present on admission) - GI f/u          Subjective: No  dizziness or fever.  Still has abdominal pain.  No diarrhea.  No hematochezia.  Objective Vital signs were reviewed and unremarkable except for blood pressure still low, heart rate normal, afebrile. Chronically ill-appearing adult female.  Obese, lying in bed, appears tired.  Attention normal and responds appropriately to questions.  Heart rate regular, no murmurs, no lower extremity edema, mild nonpitting edema in all 4 extremities.  Respiratory rate normal, lungs clear without rales or wheezes, abdomen with tenderness to minimal palpation, no guarding, no rigidity, no rebound.  Data Reviewed:  My review of labs and imaging is notable for basic metabolic panel with a stable creatinine 1.3.  Cortisol pending.    Family Communication: Husband at the bedside  Disposition: Status is: Inpatient  Remains inpatient appropriate because: She requires ongoing IV antibiotics and work-up for hypertension.  We will complete antibiotics tomorrow, if her hypotension improved with fluids, and cortisol testing is normal, likely discharge home tomorrow           Author: Edwin Dada, MD 03/04/2021 1:05 PM  For on call review [email or web address].

## 2021-03-04 NOTE — Assessment & Plan Note (Signed)
Hgb stable, no clinical bleeding 

## 2021-03-04 NOTE — Assessment & Plan Note (Signed)
No furhter diarrhea

## 2021-03-04 NOTE — Assessment & Plan Note (Signed)
TSH markedly elevated - Continue new dose levothyroxine

## 2021-03-04 NOTE — Assessment & Plan Note (Signed)
And chronic abdominal pain - Continue tizanidine, oxycodone, gabapentin - GI follow up at discharge

## 2021-03-04 NOTE — Progress Notes (Signed)
Unable to get blood return from port a cath for AM blood draws. Flushes easily. Notified Sharion Settler NP, new order for cathflow.

## 2021-03-04 NOTE — Assessment & Plan Note (Signed)
Creatinine stable relative to baseline 

## 2021-03-04 NOTE — Assessment & Plan Note (Signed)
Discussed with infectious disease, patient refusing removal of port. - Continue vancomycin until 1/22

## 2021-03-04 NOTE — Assessment & Plan Note (Signed)
Slightly improved with fluids but still hypotensive.  I do not suspect septick or cardiogenic shock.  - Check random cortisol today, if indeterminate, will perform cosyntropin stim tomorrow - Continue IV fluids - Continue midodrine for now

## 2021-03-04 NOTE — Assessment & Plan Note (Signed)
-   GI f/u

## 2021-03-04 NOTE — Assessment & Plan Note (Signed)
Completed remdesivir and steroids.  Weaned off oxygen.  No furhter treatment needed.

## 2021-03-04 NOTE — Assessment & Plan Note (Signed)
Not on medication 

## 2021-03-05 DIAGNOSIS — K3184 Gastroparesis: Secondary | ICD-10-CM

## 2021-03-05 MED ORDER — HEPARIN SOD (PORK) LOCK FLUSH 100 UNIT/ML IV SOLN
500.0000 [IU] | Freq: Once | INTRAVENOUS | Status: DC
  Filled 2021-03-05: qty 5

## 2021-03-05 NOTE — Discharge Summary (Signed)
Physician Discharge Summary   Patient: Adrienne Flores MRN: [id number] DOB: [birth year]  Admit date:     02/26/2021  Discharge date: 03/05/21  Discharge Physician: Edwin Dada   PCP: [name], MD   Recommendations at discharge:  Follow up with PCP in 1 week [facility]: Please check BP and if still lower than baseline, order Cosyntropin stim test Follow up with Gastroenterology, Dr. Virgina Jock for chronic abdominal pain Follow up with Infectious Disease for repeat blood culture in 3 weeks Follow up with Pain Medicine         Discharge Diagnoses Principal Problem:   Pneumonia due to COVID-19 virus Active Problems:   Bacteremia due to coagulase-negative Staphylococcus   Colitis   Gastroparesis   Hypothyroidism   Rheumatoid arthritis ([place])   Iron deficiency anemia   Hypotension   Fibromyalgia   Stage 3a chronic kidney disease (CKD) [facility])         Hospital Course   Adrienne Flores is a 56 y.o. 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     COVID+ with mild hypoxia.  Blood cultures drawn on 02/26/2021 positive for staph simulans methicillin-resistant 4 out of 4 bottles.  ID consulted, echocardiogram without vegetations.         * Pneumonia due to COVID-19 virus- (present on admission) Completed remdesivir and steroids.  Weaned off oxygen.  No further treatment needed.    Bacteremia due to coagulase-negative Staphylococcus-  This was an incidental finding, but ID were consulted, echo normal.  As repeat blood cultures were negative, and patient refused consideration of port removal, she was given 7 days vancomycin per ID recommendations and they will plan to draw peripheral blood cultures in their office in 3 weeks to rule out infected port/central  line.    Hypotension-  Patient had intermittent hypotension here.  This improved with fluids.  It improved with standing (was anti-orthostatic).  I do not suspect septic or cardiogenic shock.  Random cortisol 13, indeterminate, but given improvement with fluids, I doubt chronic adrenal insufficiency.  - Follow up with PCP, low threshold to refer to Endocrinology     Colitis- (present on admission) This was noted incidentally on CT imaging at admission.  Consistent with COVID.  She had 2 loose stools on admission, then this resolved without further treatment.  Doubt colitis related to her RA, nor her previous autoimmune kidney disease in 2021, nor ischemia.    Chronic abdominal pain This was the patient's chief complaint throughout her hospital stay, diffuse constant abdominal pain, without aggravating factors, relieved most with opiates.  She has a long standing history of abdominal pain.  This has been present since around the time of her cholecystectomy she believes, which was in 2006 per records.  It is daily or most days, sometimes more severe (and requires IV opiates in the ER).  Last EGD and colonoscopy I see were in 2019 by Dr. Allen Norris, unremarkable.  CT imaging normal in the past and normal here.  She does recall a time in the past when hot water baths improved her symptoms.  Nausea and vomiting are not consistently associated.  - I recommend consistent, stable follow up with a specialist, preferably GI (as the patient and her husband are not aware of the completeness of her work up) or Pain medicine  Stage 3a chronic kidney disease  History of Pauci-immune RPGN in 2021 at [facility].  Off Rituxan.  Fibromyalgia- (present on admission)  Iron deficiency anemia- (present on admission)  Rheumatoid arthritis ([place])- (present on admission)  Hypothyroidism- (present on admission)       Pain control - [place] Controlled Substance Reporting System database was reviewed.     Consultants: Infectious Disease   Disposition: Home Diet recommendation: Regular diet  DISCHARGE MEDICATION: Allergies as of 03/05/2021       Reactions   Morphine Hives   Morphine And Related Hives, Itching   ONLY MORPHINE, tolerates Dilaudid, oxycodone, and Norco   Methotrexate Derivatives Other (See Comments)   Headaches   Nabumetone Other (See Comments)   Headaches   Methotrexate Other (See Comments)   Headaches   Haloperidol Nausea Only, Other (See Comments)   Patient stated that she broke out in cold sweats, also        Medication List     STOP taking these medications    benzonatate 200 MG capsule Commonly known as: TESSALON   dicyclomine 20 MG tablet Commonly known as: BENTYL   famotidine 20 MG tablet Commonly known as: PEPCID   folic acid 1 MG tablet Commonly known as: FOLVITE   guaiFENesin 600 MG 12 hr tablet Commonly known as: MUCINEX   naproxen sodium 220 MG tablet Commonly known as: ALEVE   QUEtiapine 25 MG tablet Commonly known as: SEROQUEL       TAKE these medications    acetaminophen 500 MG tablet Commonly known as: TYLENOL Take 2 tablets by mouth every 8 (eight) hours as needed.   atorvastatin 40 MG tablet Commonly known as: LIPITOR Take 1 tablet by mouth daily.   diphenhydrAMINE-APAP (sleep) 38-500 MG Tabs Take 1 tablet by mouth at bedtime as needed (sleep).   gabapentin 600 MG tablet Commonly known as: NEURONTIN Take 600 mg by mouth 2 (two) times daily.   gabapentin 300 MG capsule Commonly known as: NEURONTIN Take 300 mg by mouth daily.   levothyroxine 150 MCG tablet Commonly known as: SYNTHROID Take 1 tablet (150 mcg total) by mouth daily before breakfast. What changed: how much to take   LUBRICATING EYE DROPS OP Place 1 drop into both eyes daily as needed (allergies).   tiZANidine 4 MG tablet Commonly known as: ZANAFLEX Take 4 mg by mouth as needed.   traMADol 50 MG tablet Commonly known as: Ultram Take 1  tablet (50 mg total) by mouth every 6 (six) hours as needed.   traMADol 50 MG tablet Commonly known as: ULTRAM Take 1 tablet by mouth every 4 (four) hours as needed.   vitamin B-12 1000 MCG tablet Commonly known as: CYANOCOBALAMIN Take 1 tablet (1,000 mcg total) by mouth daily.        Follow-up Information     Annamaria Helling, DO Follow up.   Specialty: Gastroenterology Contact information: [facility] [place] [address] [phone]         Tsosie Billing, MD .   Specialty: Infectious Diseases Contact information: [place] [address] [phone]         [name], MD. Schedule an appointment as soon as possible for a visit in 1 week(s).   Specialty: Family Medicine Contact information: [place] [address] [phone]                Discharge Instructions     Diet - low sodium heart healthy  Complete by: As directed    Discharge instructions   Complete by: As directed    From Dr. Loleta Books: You were admitted for COVID. You were treated with remdesivir, and your COVID disease is mild enough that no further treatment is needed.  Here, we also saw that you had a CT scan of the belly with no concerning findings.  There was inflammation of the colon (colitis) noted, and so we initially treated this with antibiotics, but we eventually felt that infection was unlikely, and so antibiotics were stopped.  It is not likely that colitis was causing your pain, nor that it was related to your rheumatoid arthritis, or your kidney problem from 2021.  One important thing we found was that you had bacteria in your blood stream. We consulted our infectious disease specialist, who recommended a specific course of IV antibiotics, which you completed in the hospital.  Go see Dr. Delaine Lame the infectious disease specialist in her office.  Call for an appointment within 3  weeks   For the chronic abdominal pain, I recommend you establish with a gastroenterologist for follow up Call Dr. Keane Police office (listed below) and ask for an appointment for abdominal pain. Stick with their office, allow them to start the work up and make sure that you find a regimen that helps keep things stable   Lastly, go see your primary doctor in 1 week Have them check your blood pressure   Increase activity slowly   Complete by: As directed       Discharge Instructions     Diet - low sodium heart healthy   Complete by: As directed    Discharge instructions   Complete by: As directed    From Dr. Loleta Books: You were admitted for COVID. You were treated with remdesivir, and your COVID disease is mild enough that no further treatment is needed.  Here, we also saw that you had a CT scan of the belly with no concerning findings.  There was inflammation of the colon (colitis) noted, and so we initially treated this with antibiotics, but we eventually felt that infection was unlikely, and so antibiotics were stopped.  It is not likely that colitis was causing your pain, nor that it was related to your rheumatoid arthritis, or your kidney problem from 2021.  One important thing we found was that you had bacteria in your blood stream. We consulted our infectious disease specialist, who recommended a specific course of IV antibiotics, which you completed in the hospital.  Go see Dr. Delaine Lame the infectious disease specialist in her office.  Call for an appointment within 3 weeks   For the chronic abdominal pain, I recommend you establish with a gastroenterologist for follow up Call Dr. Keane Police office (listed below) and ask for an appointment for abdominal pain. Stick with their office, allow them to start the work up and make sure that you find a regimen that helps keep things stable   Lastly, go see your primary doctor in 1 week Have them check your blood pressure   Increase  activity slowly   Complete by: As directed         Discharge Exam: Filed Weights   02/26/21 0808  Weight: 72.6 kg   General: Pt is alert, awake, lying in bed, appears chronically ill, tired and uncomfortable Cardiovascular: RRR, nl S1-S2, no murmurs appreciated.   No LE edema.   Respiratory: Normal respiratory rate and rhythm.  CTAB without rales or wheezes. Abdominal: Abdomen is diffusely  tender to light palpitation.  No distension or HSM.   Neuro/Psych: Strength symmetric in upper and lower extremities.  Judgment and insight appear normal.   Condition at discharge: fair  The results of significant diagnostics from this hospitalization (including imaging, microbiology, ancillary and laboratory) are listed below for reference.   Imaging Studies: DG Abd 1 View  Result Date: 02/28/2021 CLINICAL DATA:  Abdominal pain EXAM: ABDOMEN - 1 VIEW COMPARISON:  CT abdomen/pelvis 02/26/2021, KUB 05/07/2016 FINDINGS: There is a nonobstructive bowel gas pattern. There is no abnormal soft tissue calcification. There is no definite free intraperitoneal air, within the confines of supine technique. Cholecystectomy clips are noted. A clip is also noted in the pelvis. Density in the bladder may reflect excretion of intravenous contrast from a prior imaging study. There is no acute osseous abnormality. IMPRESSION: Nonobstructive bowel gas pattern.  No acute finding. Electronically Signed   By: Valetta Mole M.D.   On: 02/28/2021 12:06   CT Head Wo Contrast  Result Date: 02/26/2021 CLINICAL DATA:  56 year old female with history of headache. EXAM: CT HEAD WITHOUT CONTRAST TECHNIQUE: Contiguous axial images were obtained from the base of the skull through the vertex without intravenous contrast. RADIATION DOSE REDUCTION: This exam was performed according to the departmental dose-optimization program which includes automated exposure control, adjustment of the mA and/or kV according to patient size and/or use  of iterative reconstruction technique. COMPARISON:  Head CT 04/06/2011. FINDINGS: Brain: No evidence of acute infarction, hemorrhage, hydrocephalus, extra-axial collection or mass lesion/mass effect. Vascular: No hyperdense vessel or unexpected calcification. Skull: Normal. Negative for fracture or focal lesion. Sinuses/Orbits: No acute finding. Other: Fluid in the right middle ear.  Large right mastoid effusion. IMPRESSION: 1. Fluid in the right middle ear and large right mastoid effusion. Clinical correlation for signs and symptoms of otitis media and/or mastoiditis. 2. No other acute intracranial abnormalities. Electronically Signed   By: Vinnie Langton M.D.   On: 02/26/2021 11:32   CT Angio Chest Pulmonary Embolism (PE) W or WO Contrast  Result Date: 02/26/2021 CLINICAL DATA:  Chest pain, shortness of breath EXAM: CT ANGIOGRAPHY CHEST WITH CONTRAST TECHNIQUE: Multidetector CT imaging of the chest was performed using the standard protocol during bolus administration of intravenous contrast. Multiplanar CT image reconstructions and MIPs were obtained to evaluate the vascular anatomy. RADIATION DOSE REDUCTION: This exam was performed according to the departmental dose-optimization program which includes automated exposure control, adjustment of the mA and/or kV according to patient size and/or use of iterative reconstruction technique. CONTRAST:  60mL OMNIPAQUE IOHEXOL 350 MG/ML SOLN COMPARISON:  Chest radiograph done earlier today FINDINGS: Cardiovascular: There is homogeneous enhancement in thoracic aorta. There is ectasia of ascending thoracic aorta measuring 4 cm. Main pulmonary artery measures 3.2 cm in diameter. There are no intraluminal filling defects in the pulmonary artery branches. Posterior lower lung fields are not included in their entirety limiting evaluation of the posteroinferior lower lobes. Mediastinum/Nodes: There are enlarged lymph nodes in the mediastinum largest measuring 11 mm in the  short axis measurement. Thyroid is smaller than usual. Lungs/Pleura: There are new patchy alveolar and ground-glass infiltrates in the right upper right mid and both lower lung fields, more so on the right side. Posterior costophrenic angles are not fully included limiting evaluation of small portions of both lower lobes. As far as seen, there is no significant pleural effusion or pneumothorax. Upper Abdomen: Surgical clips are seen in gallbladder fossa. Musculoskeletal: Unremarkable. Review of the MIP images confirms the above findings.  IMPRESSION: There is no evidence of pulmonary artery embolism. There is no evidence of thoracic aortic dissection. There are new patchy infiltrates in the both lungs, more prominent in the right upper lung fields suggesting multifocal pneumonia. There are slightly enlarged lymph nodes in the mediastinum, possibly suggesting reactive hyperplasia. Electronically Signed   By: Elmer Picker M.D.   On: 02/26/2021 16:31   CT ABDOMEN PELVIS W CONTRAST  Result Date: 02/26/2021 CLINICAL DATA:  Acute abdominal pain beginning yesterday. EXAM: CT ABDOMEN AND PELVIS WITH CONTRAST TECHNIQUE: Multidetector CT imaging of the abdomen and pelvis was performed using the standard protocol following bolus administration of intravenous contrast. RADIATION DOSE REDUCTION: This exam was performed according to the departmental dose-optimization program which includes automated exposure control, adjustment of the mA and/or kV according to patient size and/or use of iterative reconstruction technique. CONTRAST:  159mL OMNIPAQUE IOHEXOL 300 MG/ML  SOLN COMPARISON:  Noncontrast CT on 11/04/2017 FINDINGS: Lower Chest: Multiple small focal areas of ground-glass opacity in the left lower lobe, and larger ill-defined ground-glass opacity in the right perihilar region, likely infectious or inflammatory etiology. Hepatobiliary: No hepatic masses identified. Prior cholecystectomy. No evidence of biliary  obstruction. Pancreas:  No mass or inflammatory changes. Spleen: Within normal limits in size and appearance. Adrenals/Urinary Tract: No masses identified. Stable mild right renal atrophy and scarring. No evidence of ureteral calculi or hydronephrosis. Stomach/Bowel: Mild-to-moderate colonic wall thickening throughout the ascending, transverse, and descending colon, consistent with colitis. No evidence of small bowel involvement. No evidence of abscess or free fluid. Vascular/Lymphatic: No pathologically enlarged lymph nodes. No acute vascular findings. Aortic atherosclerotic calcification noted. Reproductive: Prior hysterectomy noted. Adnexal regions are unremarkable in appearance. Other:  None. Musculoskeletal:  No suspicious bone lesions identified. IMPRESSION: Mild-to-moderate colitis involving the ascending, transverse, and descending colon. No evidence of abscess or other complication. Multifocal areas of ground-glass opacity visualized portions of both lungs, likely infectious or inflammatory etiology. Recommend clinical correlation, and consider follow-up by chest CT. Aortic Atherosclerosis (ICD10-I70.0). Electronically Signed   By: Marlaine Hind M.D.   On: 02/26/2021 11:39   DG Chest Portable 1 View  Result Date: 02/26/2021 CLINICAL DATA:  Inflammatory lung disease seen on CT. EXAM: PORTABLE CHEST 1 VIEW COMPARISON:  June 08, 2017 FINDINGS: Injectable port in stable position. Cardiomediastinal silhouette is normal. Mediastinal contours appear intact. Ground-glass and nodular subtle airspace consolidation with lower lobe predominance throughout both lungs. Osseous structures are without acute abnormality. Soft tissues are grossly normal. IMPRESSION: Ground-glass and nodular subtle airspace consolidation with lower lobe predominance throughout both lungs. Differential diagnosis includes atypical pneumonia or pulmonary edema. Electronically Signed   By: Fidela Salisbury M.D.   On: 02/26/2021 12:48    ECHOCARDIOGRAM COMPLETE  Result Date: 03/03/2021    ECHOCARDIOGRAM REPORT   Patient Name:   ZEYNAB KLETT Date of Exam: 03/03/2021 Medical Rec #:  [id number]          Height:       61.0 in Accession #:    [id number]         Weight:       160.0 lb Date of Birth:  [birth year]          BSA:          1.718 m Patient Age:    51 years           BP:           73/44 mmHg Patient Gender: F  HR:           56 bpm. Exam Location:  [facility] Procedure: 2D Echo, Color Doppler and Cardiac Doppler Indications:     R78.81 Bacteremia  History:         Patient has prior history of Echocardiogram examinations. CKD;                  Risk Factors:Dyslipidemia. Pt tested positive for COVID-19 on                  02/26/21.  Sonographer:     Charmayne Sheer Referring Phys:  [id number] Tsosie Billing Diagnosing Phys: Serafina Royals MD  Sonographer Comments: Suboptimal parasternal window and suboptimal subcostal window. Image acquisition challenging due to patient body habitus. IMPRESSIONS  1. Left ventricular ejection fraction, by estimation, is 60 to 65%. The left ventricle has normal function. The left ventricle has no regional wall motion abnormalities. Left ventricular diastolic parameters were normal.  2. Right ventricular systolic function is normal. The right ventricular size is normal.  3. The mitral valve is normal in structure. Trivial mitral valve regurgitation.  4. The aortic valve is normal in structure. Aortic valve regurgitation is trivial. FINDINGS  Left Ventricle: Left ventricular ejection fraction, by estimation, is 60 to 65%. The left ventricle has normal function. The left ventricle has no regional wall motion abnormalities. The left ventricular internal cavity size was normal in size. There is  no left ventricular hypertrophy. Left ventricular diastolic parameters were normal. Right Ventricle: The right ventricular size is normal. No increase in right ventricular wall thickness. Right ventricular  systolic function is normal. Left Atrium: Left atrial size was normal in size. Right Atrium: Right atrial size was normal in size. Pericardium: There is no evidence of pericardial effusion. Mitral Valve: The mitral valve is normal in structure. Trivial mitral valve regurgitation. MV peak gradient, 4.4 mmHg. The mean mitral valve gradient is 1.0 mmHg. Tricuspid Valve: The tricuspid valve is normal in structure. Tricuspid valve regurgitation is trivial. Aortic Valve: The aortic valve is normal in structure. Aortic valve regurgitation is trivial. Aortic regurgitation PHT measures 1128 msec. Aortic valve mean gradient measures 4.0 mmHg. Aortic valve peak gradient measures 7.3 mmHg. Aortic valve area, by VTI measures 1.96 cm. Pulmonic Valve: The pulmonic valve was normal in structure. Pulmonic valve regurgitation is not visualized. Aorta: The aortic root and ascending aorta are structurally normal, with no evidence of dilitation. IAS/Shunts: No atrial level shunt detected by color flow Doppler.  LEFT VENTRICLE PLAX 2D LVIDd:         4.23 cm   Diastology LVIDs:         2.78 cm   LV e' medial:    7.94 cm/s LV PW:         1.05 cm   LV E/e' medial:  9.7 LV IVS:        0.82 cm   LV e' lateral:   8.27 cm/s LVOT diam:     2.20 cm   LV E/e' lateral: 9.3 LV SV:         66 LV SV Index:   39 LVOT Area:     3.80 cm  RIGHT VENTRICLE RV Basal diam:  3.46 cm LEFT ATRIUM             Index        RIGHT ATRIUM           Index LA diam:        3.60 cm  2.10 cm/m   RA Area:     13.50 cm LA Vol (A2C):   45.2 ml 26.31 ml/m  RA Volume:   31.80 ml  18.51 ml/m LA Vol (A4C):   46.3 ml 26.95 ml/m LA Biplane Vol: 48.0 ml 27.94 ml/m  AORTIC VALVE                     PULMONIC VALVE AV Area (Vmax):    2.17 cm      PV Vmax:       0.87 m/s AV Area (Vmean):   2.18 cm      PV Vmean:      59.100 cm/s AV Area (VTI):     1.96 cm      PV VTI:        0.168 m AV Vmax:           135.00 cm/s   PV Peak grad:  3.0 mmHg AV Vmean:          101.000 cm/s  PV  Mean grad:  2.0 mmHg AV VTI:            0.337 m AV Peak Grad:      7.3 mmHg AV Mean Grad:      4.0 mmHg LVOT Vmax:         77.00 cm/s LVOT Vmean:        57.800 cm/s LVOT VTI:          0.174 m LVOT/AV VTI ratio: 0.52 AI PHT:            1128 msec  AORTA Ao Root diam: 3.20 cm MITRAL VALVE               TRICUSPID VALVE MV Area (PHT): 3.58 cm    TR Peak grad:   9.1 mmHg MV Area VTI:   2.32 cm    TR Vmax:        151.00 cm/s MV Peak grad:  4.4 mmHg MV Mean grad:  1.0 mmHg    SHUNTS MV Vmax:       1.05 m/s    Systemic VTI:  0.17 m MV Vmean:      45.7 cm/s   Systemic Diam: 2.20 cm MV Decel Time: 212 msec MV E velocity: 76.70 cm/s MV A velocity: 27.90 cm/s MV E/A ratio:  2.75 Serafina Royals MD Electronically signed by Serafina Royals MD Signature Date/Time: 03/03/2021/4:17:21 PM    Final     Microbiology: Results for orders placed or performed during the hospital encounter of 02/26/21  Resp Panel by RT-PCR (Flu A&B, Covid) Nasopharyngeal Swab     Status: Abnormal   Collection Time: 02/26/21  1:20 PM   Specimen: Nasopharyngeal Swab; Nasopharyngeal(NP) swabs in vial transport medium  Result Value Ref Range Status   SARS Coronavirus 2 by RT PCR POSITIVE (A) NEGATIVE Final    Comment: (NOTE) SARS-CoV-2 target nucleic acids are DETECTED.  The SARS-CoV-2 RNA is generally detectable in upper respiratory specimens during the acute phase of infection. Positive results are indicative of the presence of the identified virus, but do not rule out bacterial infection or co-infection with other pathogens not detected by the test. Clinical correlation with patient history and other diagnostic information is necessary to determine patient infection status. The expected result is Negative.  Fact Sheet for Patients: EntrepreneurPulse.com.au  Fact Sheet for Healthcare Providers: IncredibleEmployment.be  This test is not yet approved or cleared by the [place] FDA and  has been  authorized for detection and/or diagnosis of SARS-CoV-2 by FDA under an Emergency Use Authorization (EUA).  This EUA will remain in effect (meaning this test can be used) for the duration of  the COVID-19 declaration under Section 564(b)(1) of the [place] ct, 21 U.S.C. section 360bbb-3(b)(1), unless the authorization is terminated or revoked sooner.     Influenza A by PCR NEGATIVE NEGATIVE Final   Influenza B by PCR NEGATIVE NEGATIVE Final    Comment: (NOTE) The Xpert Xpress SARS-CoV-2/FLU/RSV plus assay is intended as an aid in the diagnosis of influenza from Nasopharyngeal swab specimens and should not be used as a sole basis for treatment. Nasal washings and aspirates are unacceptable for Xpert Xpress SARS-CoV-2/FLU/RSV testing.  Fact Sheet for Patients: EntrepreneurPulse.com.au  Fact Sheet for Healthcare Providers: IncredibleEmployment.be  This test is not yet approved or cleared by the [place] FDA and has been authorized for detection and/or diagnosis of SARS-CoV-2 by FDA under an Emergency Use Authorization (EUA). This EUA will remain in effect (meaning this test can be used) for the duration of the COVID-19 declaration under Section 564(b)(1) of the Act, 21 U.S.C. section 360bbb-3(b)(1), unless the authorization is terminated or revoked.  Performed at [facility], [address] [place], [place] [address]   Culture, blood (Routine X 2) w Reflex to ID Panel     Status: Abnormal   Collection Time: 02/26/21  4:13 PM   Specimen: BLOOD  Result Value Ref Range Status   Specimen Description   Final    BLOOD RIGHT HAND Performed at [facility], [address] [place], [place] [address]    Special Requests   Final    BOTTLES DRAWN AEROBIC AND ANAEROBIC Blood Culture adequate volume Performed at [facility], [address], [place], [place] [address]    Culture  Setup Time   Final    GRAM POSITIVE  COCCI IN BOTH AEROBIC AND ANAEROBIC BOTTLES CRITICAL VALUE NOTED.  VALUE IS CONSISTENT WITH PREVIOUSLY REPORTED AND CALLED VALUE. Performed at [facility], [address] [place], [place] [address]    Culture (A)  Final    STAPHYLOCOCCUS SIMULANS SUSCEPTIBILITIES PERFORMED ON PREVIOUS CULTURE WITHIN THE LAST 5 DAYS. Performed at [facility], [place] [address] [place], [place] [address]    Report Status 03/01/2021 FINAL  Final  Culture, blood (Routine X 2) w Reflex to ID Panel     Status: Abnormal   Collection Time: 02/26/21  4:13 PM   Specimen: BLOOD  Result Value Ref Range Status   Specimen Description   Final    BLOOD PORT Performed at [facility], [address] [place], [place] [address]    Special Requests   Final    BOTTLES DRAWN AEROBIC AND ANAEROBIC Blood Culture adequate volume Performed at [facility], [address] [place], [place] [address]    Culture  Setup Time   Final    GRAM POSITIVE COCCI IN BOTH AEROBIC AND ANAEROBIC BOTTLES Organism ID to follow CRITICAL RESULT CALLED TO, READ BACK BY AND VERIFIED WITH: Paulina Fusi [id number] 02/27/21 HNM Performed at [facility], [address] [place], [place] [address]    Culture STAPHYLOCOCCUS SIMULANS (A)  Final   Report Status 03/01/2021 FINAL  Final   Organism ID, Bacteria STAPHYLOCOCCUS SIMULANS  Final      Susceptibility   Staphylococcus simulans - MIC*    CIPROFLOXACIN >=8 RESISTANT Resistant     ERYTHROMYCIN <=0.25 SENSITIVE Sensitive  GENTAMICIN <=0.5 SENSITIVE Sensitive     OXACILLIN >=4 RESISTANT Resistant     TETRACYCLINE <=1 SENSITIVE Sensitive     VANCOMYCIN <=0.5 SENSITIVE Sensitive     TRIMETH/SULFA <=10 SENSITIVE Sensitive     CLINDAMYCIN <=0.25 SENSITIVE Sensitive     RIFAMPIN <=0.5 SENSITIVE Sensitive     Inducible Clindamycin NEGATIVE Sensitive     * STAPHYLOCOCCUS SIMULANS  Blood Culture ID Panel (Reflexed)     Status: Abnormal    Collection Time: 02/26/21  4:13 PM  Result Value Ref Range Status   Enterococcus faecalis NOT DETECTED NOT DETECTED Final   Enterococcus Faecium NOT DETECTED NOT DETECTED Final   Listeria monocytogenes NOT DETECTED NOT DETECTED Final   Staphylococcus species DETECTED (A) NOT DETECTED Final    Comment: CRITICAL RESULT CALLED TO, READ BACK BY AND VERIFIED WITH: Paulina Fusi [id number] [id number] 02/27/21 HNM    Staphylococcus aureus (BCID) NOT DETECTED NOT DETECTED Final   Staphylococcus epidermidis NOT DETECTED NOT DETECTED Final   Staphylococcus lugdunensis NOT DETECTED NOT DETECTED Final   Streptococcus species NOT DETECTED NOT DETECTED Final   Streptococcus agalactiae NOT DETECTED NOT DETECTED Final   Streptococcus pneumoniae NOT DETECTED NOT DETECTED Final   Streptococcus pyogenes NOT DETECTED NOT DETECTED Final   A.calcoaceticus-baumannii NOT DETECTED NOT DETECTED Final   Bacteroides fragilis NOT DETECTED NOT DETECTED Final   Enterobacterales NOT DETECTED NOT DETECTED Final   Enterobacter cloacae complex NOT DETECTED NOT DETECTED Final   Escherichia coli NOT DETECTED NOT DETECTED Final   Klebsiella aerogenes NOT DETECTED NOT DETECTED Final   Klebsiella oxytoca NOT DETECTED NOT DETECTED Final   Klebsiella pneumoniae NOT DETECTED NOT DETECTED Final   Proteus species NOT DETECTED NOT DETECTED Final   Salmonella species NOT DETECTED NOT DETECTED Final   Serratia marcescens NOT DETECTED NOT DETECTED Final   Haemophilus influenzae NOT DETECTED NOT DETECTED Final   Neisseria meningitidis NOT DETECTED NOT DETECTED Final   Pseudomonas aeruginosa NOT DETECTED NOT DETECTED Final   Stenotrophomonas maltophilia NOT DETECTED NOT DETECTED Final   Candida albicans NOT DETECTED NOT DETECTED Final   Candida auris NOT DETECTED NOT DETECTED Final   Candida glabrata NOT DETECTED NOT DETECTED Final   Candida krusei NOT DETECTED NOT DETECTED Final   Candida parapsilosis NOT DETECTED NOT DETECTED Final   Candida  tropicalis NOT DETECTED NOT DETECTED Final   Cryptococcus neoformans/gattii NOT DETECTED NOT DETECTED Final    Comment: Performed at [facility], [address] [place], [place] [address]  CULTURE, BLOOD (ROUTINE X 2) w Reflex to ID Panel     Status: None   Collection Time: 02/27/21 10:53 AM   Specimen: BLOOD  Result Value Ref Range Status   Specimen Description BLOOD [facility]  Final   Special Requests   Final    BOTTLES DRAWN AEROBIC AND ANAEROBIC Blood Culture adequate volume   Culture   Final    NO GROWTH 5 DAYS Performed at [facility], [address] [place], [place] [address]    Report Status 03/04/2021 FINAL  Final  CULTURE, BLOOD (ROUTINE X 2) w Reflex to ID Panel     Status: None   Collection Time: 02/27/21 10:54 AM   Specimen: BLOOD  Result Value Ref Range Status   Specimen Description BLOOD [facility]  Final   Special Requests   Final    BOTTLES DRAWN AEROBIC AND ANAEROBIC Blood Culture adequate volume   Culture   Final    NO GROWTH 5 DAYS Performed at  [facility], [address], [place], [place] [address]    Report Status 03/04/2021 FINAL  Final    Labs: CBC: Recent Labs  Lab 02/27/21 0615 02/28/21 0449 03/01/21 1042 03/02/21 1330 03/03/21 0525 03/04/21 1047  WBC 4.5 3.9* 4.1 6.8 3.8* 4.2  NEUTROABS 3.1 2.4  --  4.7 2.2 2.4  HGB 11.4* 9.6* 8.5* 9.8* 8.1* 9.4*  HCT 33.2* 28.2* 25.2* 28.8* 23.4* 28.1*  MCV 88.1 89.2 91.3 88.9 87.6 90.9  PLT 174 144* 128* 185 151 574   Basic Metabolic Panel: Recent Labs  Lab 03/01/21 1042 03/02/21 1330 03/03/21 0525 03/03/21 0925 03/04/21 1047  NA 138 141 141 142 142  K 3.2* 4.0 4.0 4.0 3.5  CL 115* 116* 115* 115* 112*  CO2 19* 16* 22 21* 22  GLUCOSE 99 125* 93 77 85  BUN 10 7 6 6 7   CREATININE 1.32* 1.19* 1.27* 1.30* 1.38*  CALCIUM 7.2* 7.8* 7.0* 7.2* 7.2*  MG 1.9 1.7  --   --   --   PHOS 1.3* 1.2*  --   --  2.5   Liver Function Tests: Recent Labs  Lab 02/27/21 0615 02/28/21 0449  03/02/21 1330 03/03/21 0525 03/04/21 1047  AST 49* 41 48* 33 37  ALT 17 11 15 12 15   ALKPHOS 115 70 123 100 117  BILITOT 1.0 0.7 0.9 0.6 0.7  PROT 7.0 5.7* 5.8* 4.9* 5.4*  ALBUMIN 3.8 3.0* 3.2* 2.6* 2.9*   CBG: Recent Labs  Lab 03/01/21 1656 03/01/21 1702 03/01/21 1706 03/01/21 1718 03/01/21 1733  GLUCAP 53* 55* 63* 69* 77    Discharge time spent: 45 minutes.  Signed: Edwin Dada, MD [facility] 03/05/2021

## 2021-03-05 NOTE — Progress Notes (Addendum)
Unable to get blood return from port a cath, flushes easily. Notified Sharion Settler NP, orders for IV team to assess. IV team de-accessed and and re-accessed the port without any blood return. Cathflo administered by IV team, will need to instill for 2hrs.   Addendum 0700: oncoming nurse Ubaldo Glassing notified of pending ACTH stimulation 3 point

## 2021-03-05 NOTE — Assessment & Plan Note (Signed)
as repeat blood culture is negative , and 2 d echo no vegetations  will give vanco for a total of 7 days  until 03/05/21 and repeat blood culture as oP in 3 weeks
